# Patient Record
Sex: Female | Born: 1960 | Race: Black or African American | Hispanic: No | Marital: Single | Smoking: Never smoker
Health system: Southern US, Community
[De-identification: ages and names within clinical notes are randomized; demographics above are authoritative.]

## PROBLEM LIST (undated history)

## (undated) DIAGNOSIS — I639 Cerebral infarction, unspecified: Secondary | ICD-10-CM

## (undated) DIAGNOSIS — K469 Unspecified abdominal hernia without obstruction or gangrene: Secondary | ICD-10-CM

## (undated) DIAGNOSIS — R569 Unspecified convulsions: Secondary | ICD-10-CM

## (undated) DIAGNOSIS — I2699 Other pulmonary embolism without acute cor pulmonale: Secondary | ICD-10-CM

## (undated) DIAGNOSIS — F32A Depression, unspecified: Secondary | ICD-10-CM

## (undated) DIAGNOSIS — Z931 Gastrostomy status: Secondary | ICD-10-CM

## (undated) DIAGNOSIS — F8189 Other developmental disorders of scholastic skills: Secondary | ICD-10-CM

## (undated) DIAGNOSIS — R1310 Dysphagia, unspecified: Secondary | ICD-10-CM

## (undated) DIAGNOSIS — F419 Anxiety disorder, unspecified: Secondary | ICD-10-CM

## (undated) DIAGNOSIS — Z993 Dependence on wheelchair: Secondary | ICD-10-CM

## (undated) DIAGNOSIS — I69351 Hemiplegia and hemiparesis following cerebral infarction affecting right dominant side: Secondary | ICD-10-CM

## (undated) DIAGNOSIS — K219 Gastro-esophageal reflux disease without esophagitis: Secondary | ICD-10-CM

## (undated) HISTORY — PX: TUBAL LIGATION: SHX77

## (undated) HISTORY — PX: PEG PLACEMENT: SHX5437

## (undated) HISTORY — PX: IR TRANSCATH/EMBOLIZ: IMG695

## (undated) HISTORY — PX: IR CT HEAD LTD: IMG2386

## (undated) HISTORY — PX: IR PATIENT EVAL TECH 0-60 MINS: IMG5564

## (undated) HISTORY — PX: IR ANGIOGRAM FOLLOW UP STUDY: IMG697

## (undated) HISTORY — PX: IR US GUIDE VASC ACCESS RIGHT: IMG2390

## (undated) HISTORY — PX: CRANIOTOMY: SHX93

## (undated) HISTORY — PX: ESOPHAGOGASTRODUODENOSCOPY: SHX5428

## (undated) HISTORY — PX: IR ANGIO VERTEBRAL SEL VERTEBRAL UNI R MOD SED: IMG5368

## (undated) HISTORY — PX: IR ANGIO INTRA EXTRACRAN SEL INTERNAL CAROTID BILAT MOD SED: IMG5363

## (undated) HISTORY — PX: CRANIOPLASTY: SHX1407

## (undated) HISTORY — PX: IR RADIOLOGIST EVAL & MGMT: IMG5224

## (undated) HISTORY — PX: RADIOLOGY WITH ANESTHESIA: SHX6223

## (undated) HISTORY — PX: IR NEURO EACH ADD'L AFTER BASIC UNI RIGHT (MS): IMG5374

## (undated) HISTORY — PX: VENTRAL HERNIA REPAIR: SHX424

---

## 2000-09-18 ENCOUNTER — Encounter (HOSPITAL_COMMUNITY): Admission: RE | Admit: 2000-09-18 | Discharge: 2000-10-18 | Payer: Self-pay | Admitting: Preventative Medicine

## 2003-07-17 ENCOUNTER — Emergency Department (HOSPITAL_COMMUNITY): Admission: EM | Admit: 2003-07-17 | Discharge: 2003-07-17 | Payer: Self-pay | Admitting: Emergency Medicine

## 2003-08-01 ENCOUNTER — Emergency Department (HOSPITAL_COMMUNITY): Admission: EM | Admit: 2003-08-01 | Discharge: 2003-08-02 | Payer: Self-pay | Admitting: Emergency Medicine

## 2007-06-02 ENCOUNTER — Emergency Department (HOSPITAL_COMMUNITY): Admission: EM | Admit: 2007-06-02 | Discharge: 2007-06-02 | Payer: Self-pay | Admitting: Emergency Medicine

## 2007-06-06 ENCOUNTER — Ambulatory Visit (HOSPITAL_COMMUNITY): Admission: RE | Admit: 2007-06-06 | Discharge: 2007-06-06 | Payer: Self-pay | Admitting: Orthopaedic Surgery

## 2011-03-18 LAB — SYNOVIAL CELL COUNT + DIFF, W/ CRYSTALS
Crystals, Fluid: NONE SEEN
Eosinophils-Synovial: 0
Lymphocytes-Synovial Fld: 0
Monocyte-Macrophage-Synovial Fluid: 2 — ABNORMAL LOW
Neutrophil, Synovial: 98 — ABNORMAL HIGH
WBC, Synovial: 25280 — ABNORMAL HIGH

## 2011-03-18 LAB — BODY FLUID CULTURE: Culture: NO GROWTH

## 2013-03-28 ENCOUNTER — Encounter: Payer: Self-pay | Admitting: Family Medicine

## 2013-03-28 ENCOUNTER — Ambulatory Visit (INDEPENDENT_AMBULATORY_CARE_PROVIDER_SITE_OTHER): Payer: BC Managed Care – PPO | Admitting: Family Medicine

## 2013-03-28 ENCOUNTER — Ambulatory Visit (HOSPITAL_COMMUNITY)
Admission: RE | Admit: 2013-03-28 | Discharge: 2013-03-28 | Disposition: A | Discharge status: Home / Self Care | Payer: BC Managed Care – PPO | Source: Ambulatory Visit | Attending: Family Medicine | Admitting: Family Medicine

## 2013-03-28 VITALS — BP 148/80 | Ht 64.0 in | Wt 214.0 lb

## 2013-03-28 DIAGNOSIS — M25569 Pain in unspecified knee: Secondary | ICD-10-CM

## 2013-03-28 DIAGNOSIS — M25562 Pain in left knee: Secondary | ICD-10-CM

## 2013-03-28 MED ORDER — PREDNISONE 20 MG PO TABS: ORAL_TABLET | ORAL | Status: AC

## 2013-03-28 MED ORDER — PREDNISONE 20 MG PO TABS: ORAL_TABLET | ORAL | Status: DC

## 2013-03-28 NOTE — Progress Notes (Signed)
  Subjective:    Patient ID: Denise Savage, female    DOB: 12-Nov-1960, 52 y.o.   MRN: [id number]  Leg Pain  The incident occurred 12 to 24 hours ago. The pain is present in the left knee. The pain is severe. Associated symptoms include an inability to bear weight. The symptoms are aggravated by weight bearing. She has tried ice and NSAIDs for the symptoms.   PMH benign no particular history other than standing for long span of time yesterday. No known injury. No probably this before.   Review of Systems No sharp chest pain shortness of breath.    Objective:   Physical Exam Right leg totally normal left leg ankle normal calf nontender moderate knee pain and pain behind the left knee. Also some pain and discomfort in the lower thigh. There is some swelling in the knee.       Assessment & Plan:  Stat d-dimer, stat ultrasound-rule out DVT suspect Baker cyst warning signs were discussed.  Prednisone prescribed taper over the next 9 days no work over the next few days use crutches. If ongoing trouble followup. We will followup on the test results and call patient later today.  It should be noted that the ultrasound did not show a DVT. It did show complex area in the popliteal fossa may need MRI. D-dimer negative. Patient has a followup visit for next week. Was given a work note.

## 2013-03-29 ENCOUNTER — Telehealth: Payer: Self-pay | Admitting: *Deleted

## 2013-03-29 LAB — D-DIMER, QUANTITATIVE: D-Dimer, Quant: <0.27 ug/mL-FEU (ref 0.00–0.48)

## 2013-03-29 NOTE — Telephone Encounter (Signed)
Discussed with pt. Please give work excuse

## 2013-03-29 NOTE — Telephone Encounter (Signed)
More than likely hshe will not be able to return to work anytime soon, we can give note for F SAT and SUN but we can give note through follow up visit if she wishes

## 2013-03-29 NOTE — Telephone Encounter (Signed)
Discussed with pt ultrasound and d dimer negative for blood clot. Per Dr. Lorin Picket. appt made next week for a recheck of knee. Pt. Would like WE for 10/17, 10/18, 10/19 and needs to know when she should return to work.

## 2013-03-31 ENCOUNTER — Emergency Department (HOSPITAL_COMMUNITY)
Admission: EM | Admit: 2013-03-31 | Discharge: 2013-03-31 | Disposition: A | Discharge status: Home / Self Care | Payer: BC Managed Care – PPO | Attending: Emergency Medicine | Admitting: Emergency Medicine

## 2013-03-31 ENCOUNTER — Encounter (HOSPITAL_COMMUNITY): Payer: Self-pay | Admitting: Emergency Medicine

## 2013-03-31 ENCOUNTER — Emergency Department (HOSPITAL_COMMUNITY): Payer: BC Managed Care – PPO

## 2013-03-31 DIAGNOSIS — IMO0002 Reserved for concepts with insufficient information to code with codable children: Secondary | ICD-10-CM | POA: Insufficient documentation

## 2013-03-31 DIAGNOSIS — M25569 Pain in unspecified knee: Secondary | ICD-10-CM | POA: Insufficient documentation

## 2013-03-31 DIAGNOSIS — M25562 Pain in left knee: Secondary | ICD-10-CM

## 2013-03-31 MED ORDER — HYDROCODONE-ACETAMINOPHEN 5-325 MG PO TABS: ORAL_TABLET | ORAL | Status: DC

## 2013-03-31 NOTE — ED Notes (Signed)
Patient c/o left leg pain. Per patient started having stiffness in left knee, seen PCP on Thursday was sent here for [place] to check for DVT. Did not have DVT, given prednisone-no relief. Per patient woke with increase in swelling to left knee and pain radiating down left leg.

## 2013-04-02 NOTE — ED Provider Notes (Signed)
CSN: [id number]     Arrival date & time 03/31/13  1054 History   First MD Initiated Contact with Patient 03/31/13 1140     Chief Complaint  Patient presents with  . Knee Pain   (Consider location/radiation/quality/duration/timing/severity/associated sxs/prior Treatment) Patient is a 52 y.o. female presenting with knee pain. The history is provided by the patient.  Knee Pain Location:  Knee Time since incident:  1 week Injury: no   Knee location:  L knee Pain details:    Quality:  Aching and throbbing   Radiates to:  Does not radiate   Severity:  Moderate   Onset quality:  Gradual   Timing:  Constant   Progression:  Unchanged Chronicity:  New Dislocation: no   Prior injury to area:  No Relieved by:  Rest Worsened by:  Activity, bearing weight, extension and flexion Ineffective treatments: prednisone. Associated symptoms: stiffness   Associated symptoms: no back pain, no decreased ROM, no fever, no itching, no muscle weakness, no neck pain, no numbness, no swelling and no tingling     History reviewed. No pertinent past medical history. Past Surgical History  Procedure Laterality Date  . Tubal ligation     History reviewed. No pertinent family history. History  Substance Use Topics  . Smoking status: Never Smoker   . Smokeless tobacco: Never Used  . Alcohol Use: No   OB History   Grav Para Term Preterm Abortions TAB SAB Ect Mult Living   3 3 3       3      Review of Systems  Constitutional: Negative for fever and chills.  Genitourinary: Negative for dysuria and difficulty urinating.  Musculoskeletal: Positive for arthralgias and stiffness. Negative for back pain, joint swelling and neck pain.  Skin: Negative for color change, itching and wound.  All other systems reviewed and are negative.    Allergies  Review of patient's allergies indicates no known allergies.  Home Medications   Current Outpatient Rx  Name  Route  Sig  Dispense  Refill  .  HYDROcodone-acetaminophen (NORCO/VICODIN) 5-325 MG per tablet      Take one-two tabs po q 4-6 hrs prn pain   15 tablet   0   . predniSONE (DELTASONE) 20 MG tablet      3 qd for 3d , then 2qd for 3d then 1qd for 3d   18 tablet   0    BP 181/95  Pulse 59  Temp(Src) 98.1 F (36.7 C) (Oral)  Resp 18  Ht 5\' 4"  (1.626 m)  Wt 215 lb (97.523 kg)  BMI 36.89 kg/m2  SpO2 100% Physical Exam  Nursing note and vitals reviewed. Constitutional: She is oriented to person, place, and time. She appears well-developed and well-nourished. No distress.  Cardiovascular: Normal rate, regular rhythm, normal heart sounds and intact distal pulses.   No murmur heard. Pulmonary/Chest: Effort normal and breath sounds normal. No respiratory distress.  Musculoskeletal: She exhibits tenderness. She exhibits no edema.  ttp of the medial and posterior left knee.  No erythema, effusion, or step-off deformity.  DP pulse brisk, distal sensation intact. Calf is soft and NT.  Neurological: She is alert and oriented to person, place, and time. She exhibits normal muscle tone. Coordination normal.  Skin: Skin is warm and dry. No erythema.    ED Course  Procedures (including critical care time) Labs Review Labs Reviewed - No data to display Imaging Review  Dg Knee Complete 4 Views Left  03/31/2013   CLINICAL  DATA:  Left knee pain, stiffness and swelling. Denies trauma.  EXAM: LEFT KNEE - COMPLETE 4+ VIEW  COMPARISON:  None.  FINDINGS: No fracture or dislocation.  There are arthropathic changes with significant involvement of the patellofemoral joint space compartment. There is irregularity and cystic change along the dorsal patella as well as superior and inferior spurs and narrowing of the joint space. Milder arthropathic changes are noted of the femorotibial joint space compartments. There are marginal spurs from these compartments and from the tibial spine. A small joint effusion is present.  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   Electronically Signed   By: Amie Portland M.D.   On: 03/31/2013 12:38      Previous US of the left LE from 03/28/13 was reviewed.    MDM   1. Knee pain, left    Patient with left medial and posterior knee pain.  Seen by her PMD recently for same.  Comes to ED for continued pain.  X-ray results discussed with pt.  She agrees to continue prednisone , vicodin written for pain and referral given for ortho.  Pt agrees to care plan and appears stable for discharge.    Agata Lucente L. Aashna Matson, PA-C 04/02/13 2025

## 2013-04-04 ENCOUNTER — Encounter: Payer: Self-pay | Admitting: Family Medicine

## 2013-04-04 ENCOUNTER — Ambulatory Visit (INDEPENDENT_AMBULATORY_CARE_PROVIDER_SITE_OTHER): Payer: BC Managed Care – PPO | Admitting: Family Medicine

## 2013-04-04 VITALS — BP 152/98 | Ht 64.0 in | Wt 218.8 lb

## 2013-04-04 DIAGNOSIS — M25562 Pain in left knee: Secondary | ICD-10-CM

## 2013-04-04 DIAGNOSIS — M25569 Pain in unspecified knee: Secondary | ICD-10-CM

## 2013-04-04 NOTE — Progress Notes (Signed)
  Subjective:    Patient ID: Denise Savage, female    DOB: 30-Dec-1960, 52 y.o.   MRN: [id number]  HPI Patient is here today for a f/u on her left, lower leg pain.  She can walk and bear weight now.  However, she does have a bruise on her leg.  Pt went to the ER on Sunday b/c she had stiffness in her leg. They took x-rays (only found arthritis) and gave her Vicodin, but she cannot find it, so she hasn't been taking it. Patient states that she was doing fairly well before the first day this started bothering her she worked all day long plus also stayed up at home working and she started feeling pain discomfort swelling in the knee it got worse next few days we saw her initially did blood work and ultrasound rule out a DVT. She was seen in the ER at x-rays which showed arthritis but the ultrasound showed a complex cystic mass behind the knee. The radiologist states that he could not tell if this was truly a Baker's cyst. They recommended MRI to further delineate what is going on. PMH benign family history noncontributory social noncontributory Review of Systems    see above Objective:   Physical Exam  Lungs are clear hearts regular swelling in the left leg noted more in the upper calf behind the knee tenderness pain with movement      Assessment & Plan:  Left knee pain- to get MRI, complex cystic mass needs MRI to help delineate what is going on a weight the results of the MRI.

## 2013-04-09 NOTE — ED Provider Notes (Signed)
Medical screening examination/treatment/procedure(s) were performed by non-physician practitioner and as supervising physician I was immediately available for consultation/collaboration.  EKG Interpretation   None        Donnetta Hutching, MD 04/09/13 1730

## 2013-04-11 ENCOUNTER — Other Ambulatory Visit (HOSPITAL_COMMUNITY): Payer: BC Managed Care – PPO

## 2013-04-17 ENCOUNTER — Telehealth: Payer: Self-pay | Admitting: Family Medicine

## 2013-04-17 NOTE — Telephone Encounter (Signed)
Please review results from [facility] for patients MR Left Knee without Contrast.

## 2013-04-22 NOTE — Telephone Encounter (Signed)
Spoke with patient regarding:  This MRI does not show any evidence of any tumor. 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 No sign of any growth. No need for any further testing. The knee should gradually get back to normal if it does not she should let us know.

## 2013-04-22 NOTE — Telephone Encounter (Signed)
This MRI does not show any evidence of any tumor. 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 No sign of any growth. No need for any further testing. The knee should gradually get back to normal if it does not she should let us know.

## 2013-05-10 ENCOUNTER — Encounter: Payer: Self-pay | Admitting: Family Medicine

## 2013-08-01 ENCOUNTER — Encounter: Payer: Self-pay | Admitting: Family Medicine

## 2013-08-11 ENCOUNTER — Emergency Department (HOSPITAL_COMMUNITY): Payer: BC Managed Care – PPO

## 2013-08-11 ENCOUNTER — Encounter (HOSPITAL_COMMUNITY): Payer: Self-pay | Admitting: Emergency Medicine

## 2013-08-11 ENCOUNTER — Emergency Department (HOSPITAL_COMMUNITY)
Admission: EM | Admit: 2013-08-11 | Discharge: 2013-08-12 | Disposition: A | Discharge status: Home / Self Care | Payer: BC Managed Care – PPO | Attending: Emergency Medicine | Admitting: Emergency Medicine

## 2013-08-11 DIAGNOSIS — R079 Chest pain, unspecified: Secondary | ICD-10-CM | POA: Insufficient documentation

## 2013-08-11 DIAGNOSIS — R233 Spontaneous ecchymoses: Secondary | ICD-10-CM | POA: Insufficient documentation

## 2013-08-11 DIAGNOSIS — R600 Localized edema: Secondary | ICD-10-CM

## 2013-08-11 DIAGNOSIS — R609 Edema, unspecified: Secondary | ICD-10-CM | POA: Insufficient documentation

## 2013-08-11 LAB — BASIC METABOLIC PANEL
BUN: 12 mg/dL (ref 6–23)
CO2: 30 mEq/L (ref 19–32)
Calcium: 10.7 mg/dL — ABNORMAL HIGH (ref 8.4–10.5)
Chloride: 100 mEq/L (ref 96–112)
Creatinine, Ser: 0.68 mg/dL (ref 0.50–1.10)
GFR calc Af Amer: >90 mL/min (ref >90)
GFR calc non Af Amer: >90 mL/min (ref >90)
Glucose, Bld: 96 mg/dL (ref 70–99)
Potassium: 3.8 mEq/L (ref 3.7–5.3)
Sodium: 137 mEq/L (ref 137–147)

## 2013-08-11 LAB — CBC WITH DIFFERENTIAL/PLATELET
Basophils Absolute: 0 10*3/uL (ref 0.0–0.1)
Basophils Relative: 1 % (ref 0–1)
Eosinophils Absolute: 0.2 10*3/uL (ref 0.0–0.7)
Eosinophils Relative: 3 % (ref 0–5)
HCT: 36.6 % (ref 36.0–46.0)
Hemoglobin: 11.8 g/dL — ABNORMAL LOW (ref 12.0–15.0)
Lymphocytes Relative: 26 % (ref 12–46)
Lymphs Abs: 1.5 10*3/uL (ref 0.7–4.0)
MCH: 26.8 pg (ref 26.0–34.0)
MCHC: 32.2 g/dL (ref 30.0–36.0)
MCV: 83.2 fL (ref 78.0–100.0)
Monocytes Absolute: 0.5 10*3/uL (ref 0.1–1.0)
Monocytes Relative: 8 % (ref 3–12)
Neutro Abs: 3.5 10*3/uL (ref 1.7–7.7)
Neutrophils Relative %: 63 % (ref 43–77)
Platelets: 270 10*3/uL (ref 150–400)
RBC: 4.4 MIL/uL (ref 3.87–5.11)
RDW: 15.9 % — ABNORMAL HIGH (ref 11.5–15.5)
WBC: 5.6 10*3/uL (ref 4.0–10.5)

## 2013-08-11 LAB — D-DIMER, QUANTITATIVE (NOT AT ARMC): D-Dimer, Quant: 1.7 ug/mL-FEU — ABNORMAL HIGH (ref 0.00–0.48)

## 2013-08-11 LAB — TROPONIN I: Troponin I: <0.3 ng/mL (ref <0.30)

## 2013-08-11 MED ORDER — HYDROCODONE-ACETAMINOPHEN 5-325 MG PO TABS: 2.0000 | ORAL_TABLET | ORAL | Status: DC | PRN

## 2013-08-11 MED ORDER — IOHEXOL 350 MG/ML SOLN
100.0000 mL | Freq: Once | INTRAVENOUS | Status: AC | PRN
  Administered 2013-08-11: 100 mL via INTRAVENOUS

## 2013-08-11 MED ORDER — SODIUM CHLORIDE 0.9 % IJ SOLN
INTRAMUSCULAR | Status: AC
  Filled 2013-08-11: qty 250

## 2013-08-11 MED ORDER — ENOXAPARIN SODIUM 100 MG/ML ~~LOC~~ SOLN
1.0000 mg/kg | Freq: Once | SUBCUTANEOUS | Status: AC
  Administered 2013-08-12: 100 mg via SUBCUTANEOUS
  Filled 2013-08-11: qty 1

## 2013-08-11 NOTE — ED Notes (Signed)
Family at bedside. Patient watching TV at this time. States that she is not having any pain at this time.

## 2013-08-11 NOTE — ED Notes (Signed)
Patient informed that an IV needs to be placed for a CT scan.  Patient verbalized understanding.

## 2013-08-11 NOTE — ED Notes (Signed)
Patient denies pain at this time.  Patient states she is hungry; Dr. Adriana Simas aware and states it is ok for patient to eat.  Patient advocate getting food for patient.

## 2013-08-11 NOTE — Discharge Instructions (Signed)
Prescription for pain medicine. Nurse will instruct you time to return tomorrow for Doppler test of left leg

## 2013-08-11 NOTE — ED Notes (Signed)
Patient given crackers, peanut butter and drink.

## 2013-08-11 NOTE — ED Notes (Signed)
Pt states her left leg has been swollen for a while. States she has had an [place] and MRI . States she was told the swelling was from arthritis. Also, complain of chest pain that started two days ago. Denies other symytoms

## 2013-08-11 NOTE — ED Provider Notes (Signed)
CSN: [id number]     Arrival date & time 08/11/13  1723 History  This chart was scribed for Denise Hutching, MD by Denise Savage, ED Scribe. This patient was seen in room APA19/APA19 and the patient's care was started at 5:42 PM.   Chief Complaint  Patient presents with  . Chest Pain     The history is provided by the patient. No language interpreter was used.    HPI Comments: RIKKI Savage is a 53 y.o. female who presents to the Emergency Department complaining of central CP that started around 1:30 PM yesterday. The pain has been constant since the onset and she describes the pain as a nagging, aching sensation. She denies any SOB, diaphoresis, nausea or emesis. She has taken Excredrin today with no improvement She denies any chronic medical conditions. She denies any family h/o cardiac conditions. She denies smoking. 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 She is currently on night shifts but states that she gets enough sleep and enjoys night shifts.   She has a secondary complaint of left leg swelling with bruising for the past week. Pt states that she had similar symptoms one month. She was seen by Dr. Gerda Diss and had x-rays and an MRI performed that were normal. She was also seen in the ED for the same with a normal [place].    History reviewed. No pertinent past medical history. Past Surgical History  Procedure Laterality Date  . Tubal ligation     History reviewed. No pertinent family history. History  Substance Use Topics  . Smoking status: Never Smoker   . Smokeless tobacco: Never Used  . Alcohol Use: No   OB History   Grav Para Term Preterm Abortions TAB SAB Ect Mult Living   3 3 3       3      Review of Systems  A complete 10 system review of systems was obtained and all systems are negative except as noted in the HPI and PMH.    Allergies  Review of patient's allergies indicates no known allergies.  Home Medications   Current  Outpatient Rx  Name  Route  Sig  Dispense  Refill  . ibuprofen (ADVIL,MOTRIN) 200 MG tablet   Oral   Take 200 mg by mouth every 6 (six) hours as needed. pain         . HYDROcodone-acetaminophen (NORCO) 5-325 MG per tablet   Oral   Take 2 tablets by mouth every 4 (four) hours as needed.   20 tablet   0    BP 167/88  Pulse 59  Temp(Src) 98.2 F (36.8 C) (Oral)  Resp 21  SpO2 99% Physical Exam  Nursing note and vitals reviewed. Constitutional: She is oriented to person, place, and time. She appears well-developed and well-nourished.  HENT:  Head: Normocephalic and atraumatic.  Eyes: Conjunctivae and EOM are normal. Pupils are equal, round, and reactive to light.  Neck: Normal range of motion. Neck supple.  Cardiovascular: Normal rate, regular rhythm and normal heart sounds.   Pulmonary/Chest: Effort normal and breath sounds normal.  Abdominal: Soft. Bowel sounds are normal.  Musculoskeletal: Normal range of motion. She exhibits edema.  Left leg is edematous with ecchymosis on the left calf. Right leg is normal  Neurological: She is alert and oriented to person, place, and time.  Skin: Skin is warm and dry.  Psychiatric: She has a normal mood and affect. Her behavior is  normal.    ED Course  Procedures (including critical care time)  DIAGNOSTIC STUDIES: Oxygen Saturation is 99% on room air;  normal by my interpretation.    COORDINATION OF CARE: 5:49 PM-Informed pt that her risk factors are low. Discussed treatment plan which includes  with pt at bedside and pt agreed to plan.   Labs Review Labs Reviewed  BASIC METABOLIC PANEL - Abnormal; Notable for the following:    Calcium 10.7 (*)    All other components within normal limits  CBC WITH DIFFERENTIAL - Abnormal; Notable for the following:    Hemoglobin 11.8 (*)    RDW 15.9 (*)    All other components within normal limits  D-DIMER, QUANTITATIVE - Abnormal; Notable for the following:    D-Dimer, Quant 1.70 (*)     All other components within normal limits  TROPONIN I   Imaging Review Dg Chest 2 View  08/11/2013   CLINICAL DATA:  Chest pain  EXAM: CHEST  2 VIEW  COMPARISON:  None.  FINDINGS: Cardiomediastinal silhouette is unremarkable. No acute infiltrate or pleural effusion. No pulmonary edema. Bony thorax is unremarkable.  IMPRESSION: No active cardiopulmonary disease.   Electronically Signed   By: Denise Savage  Pop M.D.   On: 08/11/2013 19:00   Ct Angio Chest Pe W/cm &/or Wo Cm  08/11/2013   CLINICAL DATA:  Chest pain. Edema. Leg swelling. Pulmonary embolism.  EXAM: CT ANGIOGRAPHY CHEST WITH CONTRAST  TECHNIQUE: Multidetector CT imaging of the chest was performed using the standard protocol during bolus administration of intravenous contrast. Multiplanar CT image reconstructions and MIPs were obtained to evaluate the vascular anatomy.  CONTRAST:  100mL OMNIPAQUE IOHEXOL 350 MG/ML SOLN  COMPARISON:  DG CHEST 2 VIEW dated 08/11/2013  FINDINGS: Technically adequate study without pulmonary embolus. No acute aortic abnormality. No axillary adenopathy. No pericardial or pleural effusion. Incidental imaging of the upper abdomen is within normal limits aside from a small hiatal hernia. Aortic branch vessels appear within normal limits.  There is a soft tissue nodule in the anterior mediastinal fat measuring 13 mm x 22 mm. No aggressive osseous lesions. Mild thoracic spondylosis. Step-off reconstruction artifact is noted on the sagittal images.  Review of the MIP images confirms the above findings.  IMPRESSION: 1. Negative for pulmonary embolus or acute aortic abnormality. 2. Small mediastinal soft tissue nodule. The appearance is nonspecific. The differential considerations are lymphadenopathy associated with lymphoma, thymoma, germ cell tumor.   Electronically Signed   By: Denise NewportGeoffrey  Savage M.D.   On: 08/11/2013 20:48     EKG Interpretation   Date/Time:  Sunday August 11 2013 17:32:56 EST Ventricular Rate:  67 PR Interval:   126 QRS Duration: 90 QT Interval:  404 QTC Calculation: 426 R Axis:   4 Text Interpretation:  Normal sinus rhythm with sinus arrhythmia Normal ECG  No previous ECGs available Confirmed by Cherity Blickenstaff  MD, Tyrell Brereton ([id number]) on  08/11/2013 6:12:22 PM      MDM   Final diagnoses:  Chest pain  Edema of left lower extremity   CT scan of chest reveals no pulmonary embolism. Lovenox 1 mg per kilogram subcutaneous given. Patient will return tomorrow for a Doppler study of left lower extremity. Discharge medications Norco.  Patient in no acute distress at discharge  I personally performed the services described in this documentation, which was scribed in my presence. The recorded information has been reviewed and is accurate.      Denise HutchingBrian Chevon Fomby, MD 08/11/13 [phone]

## 2013-08-11 NOTE — ED Notes (Signed)
Pt c/o chest pain and with left leg pain and swelling, cp since yesterday, denies sob, cough or N/V

## 2013-08-12 ENCOUNTER — Ambulatory Visit (HOSPITAL_COMMUNITY)
Admit: 2013-08-12 | Discharge: 2013-08-12 | Disposition: A | Discharge status: Home / Self Care | Payer: BC Managed Care – PPO | Attending: Emergency Medicine | Admitting: Emergency Medicine

## 2013-08-12 DIAGNOSIS — M79609 Pain in unspecified limb: Secondary | ICD-10-CM | POA: Insufficient documentation

## 2013-08-12 DIAGNOSIS — R609 Edema, unspecified: Secondary | ICD-10-CM | POA: Insufficient documentation

## 2013-08-12 NOTE — ED Provider Notes (Signed)
No DVT on [place]. [place] c/w Baker's cyst rupture. Pt informed. 1655  Hurman Horn, MD 08/12/13 [phone]

## 2013-11-28 ENCOUNTER — Encounter: Payer: Self-pay | Admitting: Family Medicine

## 2013-11-28 ENCOUNTER — Ambulatory Visit (INDEPENDENT_AMBULATORY_CARE_PROVIDER_SITE_OTHER): Payer: BC Managed Care – PPO | Admitting: Family Medicine

## 2013-11-28 VITALS — BP 140/88 | Ht 64.0 in | Wt 221.0 lb

## 2013-11-28 DIAGNOSIS — M7661 Achilles tendinitis, right leg: Secondary | ICD-10-CM

## 2013-11-28 DIAGNOSIS — M722 Plantar fascial fibromatosis: Secondary | ICD-10-CM

## 2013-11-28 DIAGNOSIS — M7660 Achilles tendinitis, unspecified leg: Secondary | ICD-10-CM

## 2013-11-28 MED ORDER — DICLOFENAC SODIUM 75 MG PO TBEC: 75.0000 mg | DELAYED_RELEASE_TABLET | Freq: Two times a day (BID) | ORAL | Status: DC

## 2013-11-28 NOTE — Patient Instructions (Addendum)
Cold compresses 20 minutes 2 to 3 times a day  Stretching exercises several times a day  If not better in 2 to 3 weeks call and we will set up podiatrist visit,   Plantar Fasciitis Plantar fasciitis is a common condition that causes foot pain. It is soreness (inflammation) of the band of tough fibrous tissue on the bottom of the foot that runs from the heel bone (calcaneus) to the ball of the foot. The cause of this soreness may be from excessive standing, poor fitting shoes, running on hard surfaces, being overweight, having an abnormal walk, or overuse (this is common in runners) of the painful foot or feet. It is also common in aerobic exercise dancers and ballet dancers. SYMPTOMS  Most people with plantar fasciitis complain of:  Severe pain in the morning on the bottom of their foot especially when taking the first steps out of bed. This pain recedes after a few minutes of walking.  Severe pain is experienced also during walking following a long period of inactivity.  Pain is worse when walking barefoot or up stairs DIAGNOSIS   Your caregiver will diagnose this condition by examining and feeling your foot.  Special tests such as X-rays of your foot, are usually not needed. PREVENTION   Consult a sports medicine professional before beginning a new exercise program.  Walking programs offer a good workout. With walking there is a lower chance of overuse injuries common to runners. There is less impact and less jarring of the joints.  Begin all new exercise programs slowly. If problems or pain develop, decrease the amount of time or distance until you are at a comfortable level.  Wear good shoes and replace them regularly.  Stretch your foot and the heel cords at the back of the ankle (Achilles tendon) both before and after exercise.  Run or exercise on even surfaces that are not hard. For example, asphalt is better than pavement.  Do not run barefoot on hard surfaces.  If using  a treadmill, vary the incline.  Do not continue to workout if you have foot or joint problems. Seek professional help if they do not improve. HOME CARE INSTRUCTIONS   Avoid activities that cause you pain until you recover.  Use ice or cold packs on the problem or painful areas after working out.  Only take over-the-counter or prescription medicines for pain, discomfort, or fever as directed by your caregiver.  Soft shoe inserts or athletic shoes with air or gel sole cushions may be helpful.  If problems continue or become more severe, consult a sports medicine caregiver or your own health care Eulah Walkup. Cortisone is a potent anti-inflammatory medication that may be injected into the painful area. You can discuss this treatment with your caregiver. MAKE SURE YOU:   Understand these instructions.  Will watch your condition.  Will get help right away if you are not doing well or get worse. Document Released: 02/22/2001 Document Revised: 08/22/2011 Document Reviewed: 04/23/2008 [facility] Patient Information 2015 [place], [place]. This information is not intended to replace advice given to you by your health care Milia Warth. Make sure you discuss any questions you have with your health care Elijio Staples.

## 2013-11-28 NOTE — Progress Notes (Signed)
   Subjective:    Patient ID: Denise Savage, female    DOB: Oct 13, 1960, 53 y.o.   MRN: [id number]  HPI Patient arrives with complaint of right heel pain for a while- doing a lot of walking and worse today. Present for 2 months No known injury but she stands on her feet all day long. She states the pain and discomfort is worse the longer she stands it is sore when she first puts weight on it. She denies any specific injury. No numbness. Review of Systems    see above Objective:   Physical Exam Right calf is normal not swollen the knee is normal. The heel has mild tenderness. The Achilles areas also has tenderness       Assessment & Plan:  Podiatry if ongoing I showed her stretching exercises to do plus also cold compresses several times a day If not significantly better in the next 2-3 weeks notify us and we will set her up with podiatry Anti-inflammatory prescribed

## 2014-04-14 ENCOUNTER — Encounter: Payer: Self-pay | Admitting: Family Medicine

## 2014-11-15 ENCOUNTER — Encounter (HOSPITAL_COMMUNITY): Payer: Self-pay | Admitting: Emergency Medicine

## 2014-11-15 ENCOUNTER — Emergency Department (HOSPITAL_COMMUNITY)
Admission: EM | Admit: 2014-11-15 | Discharge: 2014-11-15 | Disposition: A | Discharge status: Home / Self Care | Payer: BLUE CROSS/BLUE SHIELD | Attending: Emergency Medicine | Admitting: Emergency Medicine

## 2014-11-15 DIAGNOSIS — R109 Unspecified abdominal pain: Secondary | ICD-10-CM | POA: Diagnosis present

## 2014-11-15 DIAGNOSIS — K439 Ventral hernia without obstruction or gangrene: Secondary | ICD-10-CM

## 2014-11-15 DIAGNOSIS — Z9851 Tubal ligation status: Secondary | ICD-10-CM | POA: Insufficient documentation

## 2014-11-15 DIAGNOSIS — Z791 Long term (current) use of non-steroidal anti-inflammatories (NSAID): Secondary | ICD-10-CM | POA: Insufficient documentation

## 2014-11-15 LAB — COMPREHENSIVE METABOLIC PANEL WITH GFR
ALT: 10 U/L — ABNORMAL LOW (ref 14–54)
AST: 21 U/L (ref 15–41)
Albumin: 3.3 g/dL — ABNORMAL LOW (ref 3.5–5.0)
Alkaline Phosphatase: 76 U/L (ref 38–126)
Anion gap: 8 (ref 5–15)
BUN: 12 mg/dL (ref 6–20)
CO2: 26 mmol/L (ref 22–32)
Calcium: 9.9 mg/dL (ref 8.9–10.3)
Chloride: 106 mmol/L (ref 101–111)
Creatinine, Ser: 0.89 mg/dL (ref 0.44–1.00)
GFR calc Af Amer: >60 mL/min
GFR calc non Af Amer: >60 mL/min
Glucose, Bld: 107 mg/dL — ABNORMAL HIGH (ref 65–99)
Potassium: 3.2 mmol/L — ABNORMAL LOW (ref 3.5–5.1)
Sodium: 140 mmol/L (ref 135–145)
Total Bilirubin: 0.5 mg/dL (ref 0.3–1.2)
Total Protein: 6.5 g/dL (ref 6.5–8.1)

## 2014-11-15 LAB — LIPASE, BLOOD: Lipase: 36 U/L (ref 22–51)

## 2014-11-15 LAB — CBC WITH DIFFERENTIAL/PLATELET
Basophils Absolute: 0 10*3/uL (ref 0.0–0.1)
Basophils Relative: 1 % (ref 0–1)
Eosinophils Absolute: 0.1 10*3/uL (ref 0.0–0.7)
Eosinophils Relative: 3 % (ref 0–5)
HCT: 40.2 % (ref 36.0–46.0)
Hemoglobin: 12.7 g/dL (ref 12.0–15.0)
Lymphocytes Relative: 32 % (ref 12–46)
Lymphs Abs: 1.6 10*3/uL (ref 0.7–4.0)
MCH: 27.8 pg (ref 26.0–34.0)
MCHC: 31.6 g/dL (ref 30.0–36.0)
MCV: 88 fL (ref 78.0–100.0)
Monocytes Absolute: 0.5 10*3/uL (ref 0.1–1.0)
Monocytes Relative: 10 % (ref 3–12)
Neutro Abs: 2.8 10*3/uL (ref 1.7–7.7)
Neutrophils Relative %: 54 % (ref 43–77)
Platelets: 233 10*3/uL (ref 150–400)
RBC: 4.57 MIL/uL (ref 3.87–5.11)
RDW: 13.9 % (ref 11.5–15.5)
WBC: 5.1 10*3/uL (ref 4.0–10.5)

## 2014-11-15 MED ORDER — IBUPROFEN 800 MG PO TABS
800.0000 mg | ORAL_TABLET | Freq: Once | ORAL | Status: AC
  Administered 2014-11-15: 800 mg via ORAL
  Filled 2014-11-15: qty 1

## 2014-11-15 MED ORDER — ONDANSETRON 4 MG PO TBDP: 4.0000 mg | ORAL_TABLET | Freq: Three times a day (TID) | ORAL | Status: DC | PRN

## 2014-11-15 MED ORDER — ONDANSETRON 4 MG PO TBDP
4.0000 mg | ORAL_TABLET | Freq: Once | ORAL | Status: AC
  Administered 2014-11-15: 4 mg via ORAL
  Filled 2014-11-15: qty 1

## 2014-11-15 MED ORDER — HYDROCODONE-ACETAMINOPHEN 5-325 MG PO TABS: 1.0000 | ORAL_TABLET | ORAL | Status: DC | PRN

## 2014-11-15 NOTE — ED Notes (Signed)
Patient complaining of upper abdominal pain. Reports history of hernia that hasn't caused her any problems in the past. States now she has a new knot that is hard and states abdomen is swollen more so than normal. Patient guarding upper abdomen during triage.

## 2014-11-15 NOTE — Discharge Instructions (Signed)

## 2014-11-15 NOTE — ED Provider Notes (Signed)
TIME SEEN: 6:20 AM  CHIEF COMPLAINT: Abdominal pain, nausea  HPI: Pt is a 54 y.o. female with history of tubal ligation, abdominal hernia who presents to the emergency department with upper abdominal pain, swelling in this area. States that her hernia and does not normally bother her but today began to be more swollen and painful. She has had nausea but no vomiting. No diarrhea. No fever. No chest pain or shortness of breath.  ROS: See HPI Constitutional: no fever  Eyes: no drainage  ENT: no runny nose   Cardiovascular:  no chest pain  Resp: no SOB  GI: no vomiting GU: no dysuria Integumentary: no rash  Allergy: no hives  Musculoskeletal: no leg swelling  Neurological: no slurred speech ROS otherwise negative  PAST MEDICAL HISTORY/PAST SURGICAL HISTORY:  History reviewed. No pertinent past medical history.  MEDICATIONS:  Prior to Admission medications   Medication Sig Start Date End Date Taking? Authorizing Provider  diclofenac (VOLTAREN) 75 MG EC tablet Take 1 tablet (75 mg total) by mouth 2 (two) times daily. 11/28/13   Babs Sciara, MD  naproxen sodium (ANAPROX) 220 MG tablet Take 220 mg by mouth 2 (two) times daily with a meal.    Historical Provider, MD    ALLERGIES:  No Known Allergies  SOCIAL HISTORY:  History  Substance Use Topics  . Smoking status: Never Smoker   . Smokeless tobacco: Never Used  . Alcohol Use: No    FAMILY HISTORY: History reviewed. No pertinent family history.  EXAM: BP 168/90 mmHg  Pulse 60  Temp(Src) 97.5 F (36.4 C) (Oral)  Resp 22  Ht 5\' 3"  (1.6 m)  Wt 215 lb (97.523 kg)  BMI 38.09 kg/m2  SpO2 99% CONSTITUTIONAL: Alert and oriented and responds appropriately to questions. Well-appearing; well-nourished, appears uncomfortable but nontoxic HEAD: Normocephalic EYES: Conjunctivae clear, PERRL ENT: normal nose; no rhinorrhea; moist mucous membranes; pharynx without lesions noted NECK: Supple, no meningismus, no LAD  CARD: RRR; S1  and S2 appreciated; no murmurs, no clicks, no rubs, no gallops RESP: Normal chest excursion without splinting or tachypnea; breath sounds clear and equal bilaterally; no wheezes, no rhonchi, no rales, no hypoxia or respiratory distress, speaking full sentences ABD/GI: Normal bowel sounds; soft, patient has a epigastric ventral hernia that is tender to palpation, I am able to reduce this hernia using Trendelenburg which gives patient significant relief of pain, no guarding or rebound, no peritoneal signs BACK:  The back appears normal and is non-tender to palpation, there is no CVA tenderness EXT: Normal ROM in all joints; non-tender to palpation; no edema; normal capillary refill; no cyanosis, no calf tenderness or swelling    SKIN: Normal color for age and race; warm NEURO: Moves all extremities equally, sensation to light touch intact diffusely, cranial nerves II through XII intact PSYCH: The patient's mood and manner are appropriate. Grooming and personal hygiene are appropriate.  MEDICAL DECISION MAKING: Patient here with large epigastric abdominal hernia that was reducible with Trendelenburg. Otherwise seemed dynamically stable. Her pain has significant improved once hernia was reduced. Labs pending. Will give ibuprofen, Zofran and monitor. Anticipate discharge home with close outpatient follow-up with general surgery.  ED PROGRESS: Patient's labs are unremarkable. We'll discharge home with Vicodin, Zofran and surgery follow-up information. Discussed return precautions and how to attempt to reduce her hernia at home if symptoms return. She verbalized understanding and is comfortable with this plan.     Layla Maw Ward, DO 11/15/14 [phone]

## 2014-11-28 ENCOUNTER — Emergency Department (HOSPITAL_COMMUNITY)
Admission: EM | Admit: 2014-11-28 | Discharge: 2014-11-28 | Disposition: A | Discharge status: Home / Self Care | Payer: BLUE CROSS/BLUE SHIELD | Attending: Emergency Medicine | Admitting: Emergency Medicine

## 2014-11-28 ENCOUNTER — Encounter (HOSPITAL_COMMUNITY): Payer: Self-pay | Admitting: Emergency Medicine

## 2014-11-28 DIAGNOSIS — K439 Ventral hernia without obstruction or gangrene: Secondary | ICD-10-CM | POA: Diagnosis not present

## 2014-11-28 DIAGNOSIS — Z9851 Tubal ligation status: Secondary | ICD-10-CM | POA: Diagnosis not present

## 2014-11-28 DIAGNOSIS — Z791 Long term (current) use of non-steroidal anti-inflammatories (NSAID): Secondary | ICD-10-CM | POA: Insufficient documentation

## 2014-11-28 DIAGNOSIS — R1084 Generalized abdominal pain: Secondary | ICD-10-CM | POA: Diagnosis present

## 2014-11-28 HISTORY — DX: Unspecified abdominal hernia without obstruction or gangrene: K46.9

## 2014-11-28 MED ORDER — ABDOMINAL BINDER/ELASTIC LARGE MISC: 1.0000 [IU] | Freq: Every day | Status: DC

## 2014-11-28 NOTE — Discharge Instructions (Signed)

## 2014-11-28 NOTE — ED Notes (Signed)
Patient c/o abd pain. Patient seen here in ER recently due to abd pain from hernia. Patient to see Dr Lovell Sheehan on 12/09/2014. Per patient abd pain started this morning and has progressively gotten worse. Denies taking anything for pain. Patient tearful in triage from pain. Denies any nausea or vomiting.

## 2014-11-28 NOTE — ED Provider Notes (Signed)
CSN: [id number]     Arrival date & time 11/28/14  1612 History   First MD Initiated Contact with Patient 11/28/14 1752     Chief Complaint  Patient presents with  . Abdominal Pain     (Consider location/radiation/quality/duration/timing/severity/associated sxs/prior Treatment) Patient is a 54 y.o. female presenting with abdominal pain. The history is provided by the patient.  Abdominal Pain Pain location:  Generalized Associated symptoms: no chest pain, no diarrhea, no hematuria, no nausea, no shortness of breath and no vomiting    patient has had somewhat diffuse abdominal pain for most a day. Began at the site of her ventral hernia. She's had this hernia in the past is due to see Dr. Lovell Sheehan in 11 days. States it has been more prominent recently. States unfortunately it became more painful. No nausea vomiting diarrhea or constipation. No fevers. Pain is now more diffuse. Worse with movement. She has not tried to reduce the hernia on her own. No new trauma.  Past Medical History  Diagnosis Date  . Abdominal hernia    Past Surgical History  Procedure Laterality Date  . Tubal ligation     History reviewed. No pertinent family history. History  Substance Use Topics  . Smoking status: Never Smoker   . Smokeless tobacco: Never Used  . Alcohol Use: No   OB History    Gravida Para Term Preterm AB TAB SAB Ectopic Multiple Living   Review of Systems  Constitutional: Negative for activity change and appetite change.  Eyes: Negative for pain.  Respiratory: Negative for chest tightness and shortness of breath.   Cardiovascular: Negative for chest pain and leg swelling.  Gastrointestinal: Positive for abdominal pain. Negative for nausea, vomiting and diarrhea.  Genitourinary: Negative for hematuria and flank pain.  Musculoskeletal: Negative for back pain and neck stiffness.  Neurological: Negative for headaches.      Allergies  Review of patient's allergies  indicates no known allergies.  Home Medications   Prior to Admission medications   Medication Sig Start Date End Date Taking? Authorizing Provider  diclofenac (VOLTAREN) 75 MG EC tablet Take 1 tablet (75 mg total) by mouth 2 (two) times daily. 11/28/13   Babs Sciara, MD  HYDROcodone-acetaminophen (NORCO/VICODIN) 5-325 MG per tablet Take 1 tablet by mouth every 4 (four) hours as needed. 11/15/14   Kristen N Ward, DO  naproxen sodium (ANAPROX) 220 MG tablet Take 220 mg by mouth 2 (two) times daily with a meal.    Historical Provider, MD  ondansetron (ZOFRAN ODT) 4 MG disintegrating tablet Take 1 tablet (4 mg total) by mouth every 8 (eight) hours as needed for nausea or vomiting. 11/15/14   Kristen N Ward, DO   BP 174/84 mmHg  Pulse 63  Temp(Src) 98.2 F (36.8 C) (Oral)  Resp 20  Ht  (1.626 m)  Wt 210 lb (95.255 kg)  BMI 36.03 kg/m2  SpO2 100% Physical Exam  Constitutional: She appears well-developed and well-nourished.  HENT:  Head: Atraumatic.  Cardiovascular: Normal rate and regular rhythm.   Pulmonary/Chest: Effort normal.  Abdominal: Soft. She exhibits mass. There is tenderness.  Tenderness over ventral hernia on the midline superior to the umbilicus. With gentle pressure the hernia reduces but does recur. No other hernias palpated. No other abdominal distention.    ED Course  Procedures (including critical care time) Labs Review Labs Reviewed - No data to display  Imaging Review  No results found.   EKG Interpretation None      MDM   Final diagnoses:  Ventral hernia without obstruction or gangrene    Patient with ventral hernia. Reducible. Feels better after reduction. Will discharge home to follow-up with general surgery patient was instructed on how to reduce the hernia herself and what to watch out for to return to the ER.    Benjiman Core, MD 11/28/14 2007

## 2014-12-10 NOTE — H&P (Signed)
  NTS SOAP Note  Vital Signs:  Vitals as of: 12/09/2014: Systolic 158: Diastolic 91: Heart Rate 73: Temp 105F: Height 34ft 4in: Weight 222Lbs 0 Ounces: BMI 38.11  BMI : 38.11 kg/m2  Subjective: This 54 year old female presents for of a ventral hernia.  Was found to have a hernia superior to umbilicus.  Made worse with straining.  Is tender to touch when sticking out.  Review of Symptoms:  Constitutional:unremarkable   Head:unremarkable Eyes:unremarkable   Nose/Mouth/Throat:unremarkable Cardiovascular:  unremarkable Respiratory:unremarkable Gastrointestinal:  unremarkable   Genitourinary:unremarkable   joint pain Skin:unremarkable Hematolgic/Lymphatic:unremarkable   Allergic/Immunologic:unremarkable   Past Medical History:  Reviewed  Past Medical History  Surgical History: BTL Medical Problems: none Allergies: hydrocodone Medications: MVI   Social History:Reviewed  Social History  Preferred Language: English Race:  Black or African American Ethnicity: Not Hispanic / Latino Age: 25 year Marital Status:  S Alcohol: unknown   Smoking Status: Unknown if ever smoked reviewed on 12/09/2014 Functional Status reviewed on 12/09/2014 ------------------------------------------------ Bathing: Normal Cooking: Normal Dressing: Normal Driving: Normal Eating: Normal Managing Meds: Normal Oral Care: Normal Shopping: Normal Toileting: Normal Transferring: Normal Walking: Normal Cognitive Status reviewed on 12/09/2014 ------------------------------------------------ Attention: Normal Decision Making: Normal Language: Normal Memory: Normal Motor: Normal Perception: Normal Problem Solving: Normal Visual and Spatial: Normal   Family History:Reviewed  Family Health History Mother, Healthy;  Father, Healthy;     Objective Information: General:Well appearing, well nourished in no distress. Heart:RRR, no murmur Lungs:  CTA bilaterally, no  wheezes, rhonchi, rales.  Breathing unlabored. Abdomen:Soft, NT/ND, no HSM, no masses.  Elongated reducible ventral hernia along the mildine of abdomen suprio to umbilicus extending towards but not including the epigastrium.  Assessment:Ventral hernia  Diagnoses: 553.8  K43.9 Hernia of anterior abdominal wall without obstruction AND without gangrene (Ventral hernia without obstruction or gangrene)  Procedures: 45409 - OFFICE OUTPATIENT NEW 30 MINUTES    Plan:  Scheduled for laparoscopic ventral herniorrhaphy with mesh on 12/29/14.   Patient Education:Alternative treatments to surgery were discussed with patient (and family).  Risks and benefits  of procedure including bleedingk infection, bowel injury, and recurrence of the hernia were fully explained to the patient (and family) who gave informed consent. Patient/family questions were addressed.  Follow-up:Pending Surgery

## 2014-12-24 NOTE — Patient Instructions (Signed)
Denise Savage  12/24/2014     @   Your procedure is scheduled on  12/29/2014  Report to [facility] at  615  A.M.  Call this number if you have problems the morning of surgery:  [phone]   Remember:  Do not eat food or drink liquids after midnight.  Take these medicines the morning of surgery with A SIP OF WATER  Hydrocodone, zofran.   Do not wear jewelry, make-up or nail polish.  Do not wear lotions, powders, or perfumes.   Do not shave 48 hours prior to surgery.  Men may shave face and neck.  Do not bring valuables to the hospital.  [facility] is not responsible for any belongings or valuables.  Contacts, dentures or bridgework may not be worn into surgery.  Leave your suitcase in the car.  After surgery it may be brought to your room.  For patients admitted to the hospital, discharge time will be determined by your treatment team.  Patients discharged the day of surgery will not be allowed to drive home.   Name and phone number of your driver:   family Special instructions:  none  Please read over the following fact sheets that you were given. Pain Booklet, Coughing and Deep Breathing, Surgical Site Infection Prevention, Anesthesia Post-op Instructions and Care and Recovery After Surgery      Hernia A hernia occurs when an internal organ pushes out through a weak spot in the abdominal wall. Hernias most commonly occur in the groin and around the navel. Hernias often can be pushed back into place (reduced). Most hernias tend to get worse over time. Some abdominal hernias can get stuck in the opening (irreducible or incarcerated hernia) and cannot be reduced. An irreducible abdominal hernia which is tightly squeezed into the opening is at risk for impaired blood supply (strangulated hernia). A strangulated hernia is a medical emergency. Because of the risk for an irreducible or strangulated hernia, surgery may be recommended to repair a  hernia. CAUSES   Heavy lifting.  Prolonged coughing.  Straining to have a bowel movement.  A cut (incision) made during an abdominal surgery. HOME CARE INSTRUCTIONS   Bed rest is not required. You may continue your normal activities.  Avoid lifting more than 10 pounds (4.5 kg) or straining.  Cough gently. If you are a smoker it is best to stop. Even the best hernia repair can break down with the continual strain of coughing. Even if you do not have your hernia repaired, a cough will continue to aggravate the problem.  Do not wear anything tight over your hernia. Do not try to keep it in with an outside bandage or truss. These can damage abdominal contents if they are trapped within the hernia sac.  Eat a normal diet.  Avoid constipation. Straining over long periods of time will increase hernia size and encourage breakdown of repairs. If you cannot do this with diet alone, stool softeners may be used. SEEK IMMEDIATE MEDICAL CARE IF:   You have a fever.  You develop increasing abdominal pain.  You feel nauseous or vomit.  Your hernia is stuck outside the abdomen, looks discolored, feels hard, or is tender.  You have any changes in your bowel habits or in the hernia that are unusual for you.  You have increased pain or swelling around the hernia.  You cannot push the hernia back in place by applying gentle pressure while lying down. MAKE  SURE YOU:   Understand these instructions.  Will watch your condition.  Will get help right away if you are not doing well or get worse. Document Released: 05/30/2005 Document Revised: 08/22/2011 Document Reviewed: 01/17/2008 [facility] Patient Information 2015 [place], [place]. This information is not intended to replace advice given to you by your health care provider. Make sure you discuss any questions you have with your health care provider. PATIENT INSTRUCTIONS POST-ANESTHESIA  IMMEDIATELY FOLLOWING SURGERY:  Do not drive or operate  machinery for the first twenty four hours after surgery.  Do not make any important decisions for twenty four hours after surgery or while taking narcotic pain medications or sedatives.  If you develop intractable nausea and vomiting or a severe headache please notify your doctor immediately.  FOLLOW-UP:  Please make an appointment with your surgeon as instructed. You do not need to follow up with anesthesia unless specifically instructed to do so.  WOUND CARE INSTRUCTIONS (if applicable):  Keep a dry clean dressing on the anesthesia/puncture wound site if there is drainage.  Once the wound has quit draining you may leave it open to air.  Generally you should leave the bandage intact for twenty four hours unless there is drainage.  If the epidural site drains for more than 36-48 hours please call the anesthesia department.  QUESTIONS?:  Please feel free to call your physician or the hospital operator if you have any questions, and they will be happy to assist you.

## 2014-12-25 ENCOUNTER — Encounter (HOSPITAL_COMMUNITY): Payer: Self-pay

## 2014-12-25 ENCOUNTER — Encounter (HOSPITAL_COMMUNITY)
Admission: RE | Admit: 2014-12-25 | Discharge: 2014-12-25 | Disposition: A | Discharge status: Home / Self Care | Payer: BLUE CROSS/BLUE SHIELD | Source: Ambulatory Visit | Attending: General Surgery | Admitting: General Surgery

## 2014-12-25 DIAGNOSIS — K439 Ventral hernia without obstruction or gangrene: Secondary | ICD-10-CM | POA: Diagnosis not present

## 2014-12-25 DIAGNOSIS — Z01818 Encounter for other preprocedural examination: Secondary | ICD-10-CM | POA: Diagnosis present

## 2014-12-25 LAB — BASIC METABOLIC PANEL
Anion gap: 8 (ref 5–15)
BUN: 14 mg/dL (ref 6–20)
CO2: 26 mmol/L (ref 22–32)
Calcium: 10.7 mg/dL — ABNORMAL HIGH (ref 8.9–10.3)
Chloride: 103 mmol/L (ref 101–111)
Creatinine, Ser: 0.81 mg/dL (ref 0.44–1.00)
GFR calc Af Amer: >60 mL/min (ref >60)
GFR calc non Af Amer: >60 mL/min (ref >60)
Glucose, Bld: 100 mg/dL — ABNORMAL HIGH (ref 65–99)
Potassium: 4 mmol/L (ref 3.5–5.1)
Sodium: 137 mmol/L (ref 135–145)

## 2014-12-25 LAB — CBC WITH DIFFERENTIAL/PLATELET
Basophils Absolute: 0 10*3/uL (ref 0.0–0.1)
Basophils Relative: 1 % (ref 0–1)
Eosinophils Absolute: 0.1 10*3/uL (ref 0.0–0.7)
Eosinophils Relative: 2 % (ref 0–5)
HCT: 39.6 % (ref 36.0–46.0)
Hemoglobin: 13 g/dL (ref 12.0–15.0)
Lymphocytes Relative: 39 % (ref 12–46)
Lymphs Abs: 2.2 10*3/uL (ref 0.7–4.0)
MCH: 28.4 pg (ref 26.0–34.0)
MCHC: 32.8 g/dL (ref 30.0–36.0)
MCV: 86.5 fL (ref 78.0–100.0)
Monocytes Absolute: 0.7 10*3/uL (ref 0.1–1.0)
Monocytes Relative: 12 % (ref 3–12)
Neutro Abs: 2.6 10*3/uL (ref 1.7–7.7)
Neutrophils Relative %: 46 % (ref 43–77)
Platelets: 238 10*3/uL (ref 150–400)
RBC: 4.58 MIL/uL (ref 3.87–5.11)
RDW: 14.1 % (ref 11.5–15.5)
WBC: 5.7 10*3/uL (ref 4.0–10.5)

## 2014-12-29 ENCOUNTER — Encounter (HOSPITAL_COMMUNITY): Payer: Self-pay | Admitting: *Deleted

## 2014-12-29 ENCOUNTER — Ambulatory Visit (HOSPITAL_COMMUNITY): Payer: BLUE CROSS/BLUE SHIELD | Admitting: Anesthesiology

## 2014-12-29 ENCOUNTER — Encounter (HOSPITAL_COMMUNITY)
Admission: RE | Disposition: A | Discharge status: Home / Self Care | Payer: Self-pay | Source: Ambulatory Visit | Attending: General Surgery

## 2014-12-29 ENCOUNTER — Ambulatory Visit (HOSPITAL_COMMUNITY)
Admission: RE | Admit: 2014-12-29 | Discharge: 2014-12-29 | Disposition: A | Discharge status: Home / Self Care | Payer: BLUE CROSS/BLUE SHIELD | Source: Ambulatory Visit | Attending: General Surgery | Admitting: General Surgery

## 2014-12-29 DIAGNOSIS — K660 Peritoneal adhesions (postprocedural) (postinfection): Secondary | ICD-10-CM | POA: Insufficient documentation

## 2014-12-29 DIAGNOSIS — K439 Ventral hernia without obstruction or gangrene: Secondary | ICD-10-CM | POA: Diagnosis not present

## 2014-12-29 HISTORY — PX: VENTRAL HERNIA REPAIR: SHX424

## 2014-12-29 SURGERY — REPAIR, HERNIA, VENTRAL, LAPAROSCOPIC
Anesthesia: General | Site: Abdomen

## 2014-12-29 MED ORDER — ONDANSETRON HCL 4 MG/2ML IJ SOLN
4.0000 mg | Freq: Once | INTRAMUSCULAR | Status: AC
  Administered 2014-12-29: 4 mg via INTRAVENOUS
  Filled 2014-12-29: qty 2

## 2014-12-29 MED ORDER — NEOSTIGMINE METHYLSULFATE 10 MG/10ML IV SOLN
INTRAVENOUS | Status: DC | PRN
  Administered 2014-12-29: 4 mg via INTRAVENOUS

## 2014-12-29 MED ORDER — BUPIVACAINE HCL (PF) 0.5 % IJ SOLN
INTRAMUSCULAR | Status: DC | PRN
  Administered 2014-12-29: 10 mL

## 2014-12-29 MED ORDER — SUCCINYLCHOLINE CHLORIDE 20 MG/ML IJ SOLN
INTRAMUSCULAR | Status: AC
  Filled 2014-12-29: qty 1

## 2014-12-29 MED ORDER — FENTANYL CITRATE (PF) 250 MCG/5ML IJ SOLN
INTRAMUSCULAR | Status: DC | PRN
  Administered 2014-12-29: 50 ug via INTRAVENOUS
  Administered 2014-12-29: 25 ug via INTRAVENOUS
  Administered 2014-12-29: 50 ug via INTRAVENOUS
  Administered 2014-12-29: 25 ug via INTRAVENOUS

## 2014-12-29 MED ORDER — KETOROLAC TROMETHAMINE 30 MG/ML IJ SOLN
INTRAMUSCULAR | Status: AC
  Filled 2014-12-29: qty 1

## 2014-12-29 MED ORDER — POVIDONE-IODINE 10 % EX OINT
TOPICAL_OINTMENT | CUTANEOUS | Status: AC
  Filled 2014-12-29: qty 1

## 2014-12-29 MED ORDER — GLYCOPYRROLATE 0.2 MG/ML IJ SOLN
INTRAMUSCULAR | Status: AC
  Filled 2014-12-29: qty 1

## 2014-12-29 MED ORDER — GLYCOPYRROLATE 0.2 MG/ML IJ SOLN
INTRAMUSCULAR | Status: AC
  Filled 2014-12-29: qty 3

## 2014-12-29 MED ORDER — GLYCOPYRROLATE 0.2 MG/ML IJ SOLN
INTRAMUSCULAR | Status: DC | PRN
  Administered 2014-12-29: .7 mg via INTRAVENOUS

## 2014-12-29 MED ORDER — 0.9 % SODIUM CHLORIDE (POUR BTL) OPTIME
TOPICAL | Status: DC | PRN
  Administered 2014-12-29: 1000 mL

## 2014-12-29 MED ORDER — GLYCOPYRROLATE 0.2 MG/ML IJ SOLN
0.2000 mg | Freq: Once | INTRAMUSCULAR | Status: AC
  Administered 2014-12-29: 0.2 mg via INTRAVENOUS

## 2014-12-29 MED ORDER — CEFAZOLIN SODIUM-DEXTROSE 2-3 GM-% IV SOLR
2.0000 g | INTRAVENOUS | Status: AC
  Administered 2014-12-29: 2 g via INTRAVENOUS
  Filled 2014-12-29: qty 50

## 2014-12-29 MED ORDER — DEXAMETHASONE SODIUM PHOSPHATE 4 MG/ML IJ SOLN
4.0000 mg | Freq: Once | INTRAMUSCULAR | Status: AC
  Administered 2014-12-29: 4 mg via INTRAVENOUS

## 2014-12-29 MED ORDER — ROCURONIUM BROMIDE 50 MG/5ML IV SOLN
INTRAVENOUS | Status: AC
  Filled 2014-12-29: qty 1

## 2014-12-29 MED ORDER — LIDOCAINE HCL (PF) 1 % IJ SOLN
INTRAMUSCULAR | Status: AC
  Filled 2014-12-29: qty 5

## 2014-12-29 MED ORDER — OXYCODONE-ACETAMINOPHEN 7.5-325 MG PO TABS: 1.0000 | ORAL_TABLET | ORAL | Status: DC | PRN

## 2014-12-29 MED ORDER — MIDAZOLAM HCL 2 MG/2ML IJ SOLN
1.0000 mg | INTRAMUSCULAR | Status: DC | PRN
  Administered 2014-12-29: 2 mg via INTRAVENOUS

## 2014-12-29 MED ORDER — FENTANYL CITRATE (PF) 250 MCG/5ML IJ SOLN
INTRAMUSCULAR | Status: AC
  Filled 2014-12-29: qty 5

## 2014-12-29 MED ORDER — PROPOFOL 10 MG/ML IV BOLUS
INTRAVENOUS | Status: DC | PRN
  Administered 2014-12-29: 150 mg via INTRAVENOUS

## 2014-12-29 MED ORDER — CHLORHEXIDINE GLUCONATE 4 % EX LIQD: 1.0000 "application " | Freq: Once | CUTANEOUS | Status: DC

## 2014-12-29 MED ORDER — ROCURONIUM BROMIDE 100 MG/10ML IV SOLN
INTRAVENOUS | Status: DC | PRN
  Administered 2014-12-29: 5 mg via INTRAVENOUS
  Administered 2014-12-29: 30 mg via INTRAVENOUS
  Administered 2014-12-29: 15 mg via INTRAVENOUS

## 2014-12-29 MED ORDER — DEXAMETHASONE SODIUM PHOSPHATE 4 MG/ML IJ SOLN
INTRAMUSCULAR | Status: AC
  Filled 2014-12-29: qty 1

## 2014-12-29 MED ORDER — BUPIVACAINE HCL (PF) 0.5 % IJ SOLN
INTRAMUSCULAR | Status: AC
  Filled 2014-12-29: qty 30

## 2014-12-29 MED ORDER — LIDOCAINE HCL (CARDIAC) 20 MG/ML IV SOLN
INTRAVENOUS | Status: DC | PRN
  Administered 2014-12-29: 40 mg via INTRAVENOUS

## 2014-12-29 MED ORDER — SUCCINYLCHOLINE CHLORIDE 20 MG/ML IJ SOLN
INTRAMUSCULAR | Status: DC | PRN
  Administered 2014-12-29: 120 mg via INTRAVENOUS

## 2014-12-29 MED ORDER — KETOROLAC TROMETHAMINE 30 MG/ML IJ SOLN
30.0000 mg | Freq: Once | INTRAMUSCULAR | Status: AC
  Administered 2014-12-29: 30 mg via INTRAVENOUS

## 2014-12-29 MED ORDER — FENTANYL CITRATE (PF) 100 MCG/2ML IJ SOLN
25.0000 ug | INTRAMUSCULAR | Status: AC
  Administered 2014-12-29 (×2): 25 ug via INTRAVENOUS

## 2014-12-29 MED ORDER — MIDAZOLAM HCL 2 MG/2ML IJ SOLN
INTRAMUSCULAR | Status: AC
  Filled 2014-12-29: qty 2

## 2014-12-29 MED ORDER — FENTANYL CITRATE (PF) 100 MCG/2ML IJ SOLN
INTRAMUSCULAR | Status: AC
  Filled 2014-12-29: qty 2

## 2014-12-29 MED ORDER — ONDANSETRON HCL 4 MG/2ML IJ SOLN: 4.0000 mg | Freq: Once | INTRAMUSCULAR | Status: DC | PRN

## 2014-12-29 MED ORDER — LACTATED RINGERS IV SOLN
INTRAVENOUS | Status: DC
  Administered 2014-12-29 (×2): via INTRAVENOUS

## 2014-12-29 MED ORDER — PROPOFOL 10 MG/ML IV BOLUS
INTRAVENOUS | Status: AC
  Filled 2014-12-29: qty 20

## 2014-12-29 MED ORDER — NEOSTIGMINE METHYLSULFATE 10 MG/10ML IV SOLN
INTRAVENOUS | Status: AC
  Filled 2014-12-29: qty 1

## 2014-12-29 MED ORDER — FENTANYL CITRATE (PF) 100 MCG/2ML IJ SOLN
25.0000 ug | INTRAMUSCULAR | Status: DC | PRN
  Administered 2014-12-29 (×2): 50 ug via INTRAVENOUS
  Filled 2014-12-29: qty 2

## 2014-12-29 SURGICAL SUPPLY — 47 items
BAG HAMPER (MISCELLANEOUS) ×2 IMPLANT
CLOSURE STERI STRIP 1/2 X4 (GAUZE/BANDAGES/DRESSINGS) ×1 IMPLANT
CLOTH BEACON ORANGE TIMEOUT ST (SAFETY) ×2 IMPLANT
COVER LIGHT HANDLE STERIS (MISCELLANEOUS) ×4 IMPLANT
DECANTER SPIKE VIAL GLASS SM (MISCELLANEOUS) ×2 IMPLANT
DEVICE TROCAR PUNCTURE CLOSURE (ENDOMECHANICALS) ×2 IMPLANT
DURAPREP 26ML APPLICATOR (WOUND CARE) ×2 IMPLANT
ELECT REM PT RETURN 9FT ADLT (ELECTROSURGICAL) ×2
ELECTRODE REM PT RTRN 9FT ADLT (ELECTROSURGICAL) ×1 IMPLANT
FILTER SMOKE EVAC LAPAROSHD (FILTER) ×2 IMPLANT
GLOVE BIO SURGEON STRL SZ7 (GLOVE) ×1 IMPLANT
GLOVE BIOGEL PI IND STRL 7.0 (GLOVE) IMPLANT
GLOVE BIOGEL PI INDICATOR 7.0 (GLOVE) ×3
GLOVE ECLIPSE 7.0 STRL STRAW (GLOVE) ×1 IMPLANT
GLOVE SURG SS PI 7.5 STRL IVOR (GLOVE) ×4 IMPLANT
GOWN STRL REUS W/TWL LRG LVL3 (GOWN DISPOSABLE) ×6 IMPLANT
INST SET LAPROSCOPIC AP (KITS) ×2 IMPLANT
IV NS IRRIG 3000ML ARTHROMATIC (IV SOLUTION) IMPLANT
KIT ROOM TURNOVER APOR (KITS) ×2 IMPLANT
LIGASURE LAP ATLAS 10MM 37CM (INSTRUMENTS) ×1 IMPLANT
LIQUID BAND (GAUZE/BANDAGES/DRESSINGS) ×2 IMPLANT
MANIFOLD NEPTUNE II (INSTRUMENTS) ×2 IMPLANT
MESH VENTRALIGHT ST 4X6IN (Mesh General) ×1 IMPLANT
NDL INSUFFLATION 14GA 120MM (NEEDLE) ×1 IMPLANT
NEEDLE INSUFFLATION 14GA 120MM (NEEDLE) ×2 IMPLANT
NS IRRIG 1000ML POUR BTL (IV SOLUTION) ×2 IMPLANT
PACK LAP CHOLE LZT030E (CUSTOM PROCEDURE TRAY) ×2 IMPLANT
PAD ARMBOARD 7.5X6 YLW CONV (MISCELLANEOUS) ×2 IMPLANT
PENCIL HANDSWITCHING (ELECTRODE) ×2 IMPLANT
SET BASIN LINEN APH (SET/KITS/TRAYS/PACK) ×2 IMPLANT
SET TUBE IRRIG SUCTION NO TIP (IRRIGATION / IRRIGATOR) IMPLANT
SPONGE GAUZE 2X2 8PLY STRL LF (GAUZE/BANDAGES/DRESSINGS) ×4 IMPLANT
STAPLER VISISTAT (STAPLE) ×2 IMPLANT
STRIP CLOSURE SKIN 1/2X4 (GAUZE/BANDAGES/DRESSINGS) ×2 IMPLANT
SUT NOVA NAB GS-22 2 2-0 T-19 (SUTURE) ×1 IMPLANT
SUT PROLENE 2 0 CR (SUTURE) ×2 IMPLANT
SUT VICRYL 0 UR6 27IN ABS (SUTURE) ×2 IMPLANT
TACKER 5MM HERNIA 3.5CML NAB (ENDOMECHANICALS) ×1 IMPLANT
TAPE CLOTH SURG 4X10 WHT LF (GAUZE/BANDAGES/DRESSINGS) ×2 IMPLANT
TOWEL OR 17X26 4PK STRL BLUE (TOWEL DISPOSABLE) ×2 IMPLANT
TRAY FOLEY CATH SILVER 16FR (SET/KITS/TRAYS/PACK) ×2 IMPLANT
TROCAR ENDO BLADELESS 11MM (ENDOMECHANICALS) ×2 IMPLANT
TROCAR XCEL NON-BLD 5MMX100MML (ENDOMECHANICALS) ×1 IMPLANT
TROCAR XCEL UNIV SLVE 11M 100M (ENDOMECHANICALS) ×4 IMPLANT
TUBING HI FLO HEAT INSUFFLATOR (IRRIGATION / IRRIGATOR) ×2 IMPLANT
WARMER LAPAROSCOPE (MISCELLANEOUS) ×2 IMPLANT
YANKAUER SUCT BULB TIP 10FT TU (MISCELLANEOUS) ×2 IMPLANT

## 2014-12-29 NOTE — Op Note (Signed)
Patient:  Denise Savage  DOB:  06/09/1961  MRN:  [id number]   Preop Diagnosis:  Ventral hernia  Postop Diagnosis:  Same  Procedure:  Laparoscopic ventral herniorrhaphy with mesh  Surgeon:  Franky Macho, M.D.  Anes:  Gen. endotracheal  Indications:  Patient is a 54 year old black female who presents with ventral hernia just superior to the umbilicus. The risks and benefits of the procedure including bleeding, infection, mesh use, and the possibility of recurrence of the hernia were fully explained to the patient, who gave informed consent.  Procedure note:  The patient was placed the supine position. After induction of general endotracheal anesthesia, the abdomen was prepped and draped using the usual sterile technique with DuraPrep. Surgical site confirmation was performed.  I initially attempted to use a Veress needle in the epigastric region. After this attempt failed, a left upper quadrant incision was made and using an 11 mm direct view trocar, the abdominal cavity was entered into without difficulty. The abdomen was then insufflated to 16 mmHg pressure. The bowel and stomach was inspected and there was no evidence of injury noted. An 11 mm trocars were placed in the left lower quadrant region and right lower quadrant region, and a 5 mm trocar placed the right upper quadrant region. The patient had a single adhesion of omentum towards the umbilicus. This was freed away using the LigaSure without difficulty. A 6 x 7 cm midline ventral hernia was noted superior to the umbilicus. Remnants of the hernia sac were excised without difficulty. A 10 x 15 cm Bard ventral X ST patch was then inserted and secured in 4 quadrants using 2-0 Novafil interrupted sutures. A pro-tacker was then used to secure the mesh to the anterior abdominal wall in a circumferential fashion. Good overlap of the edges of the hernia was noted. All fluid and air were then evacuated from the abdominal cavity prior to removal of  the trochars.  All wounds were irrigated with normal saline. All wounds were injected with 0.5% Sensorcaine. The incisions were all closed using staples. Betadine ointment and dry sterile dressings were applied.  All tape and needle counts were correct at the end of the procedure. Patient was extubated in the operating room and transferred to PACU in stable condition.  Complications:  None  EBL:  Minimal  Specimen:  None

## 2014-12-29 NOTE — Anesthesia Postprocedure Evaluation (Signed)
  Anesthesia Post-op Note  Patient: Denise Savage  Procedure(s) Performed: Procedure(s): LAPAROSCOPIC VENTRAL HERNIA WITH MESH (N/A)  Patient Location: Short Stay  Anesthesia Type:General  Level of Consciousness: awake, alert  and oriented  Airway and Oxygen Therapy: Patient Spontanous Breathing  Post-op Pain: mild  Post-op Assessment: Post-op Vital signs reviewed, Patient's Cardiovascular Status Stable, Respiratory Function Stable, Patent Airway, No signs of Nausea or vomiting and Adequate PO intake              Post-op Vital Signs: Reviewed and stable  Last Vitals:  Filed Vitals:   12/29/14 1000  BP: 143/77  Pulse: 72  Temp:   Resp: 17    Complications: No apparent anesthesia complications

## 2014-12-29 NOTE — Discharge Instructions (Signed)
Laparoscopic Ventral Hernia Repair  Care After  Refer to this sheet in the next few weeks. These instructions provide you with information on caring for yourself after your procedure. Your caregiver may also give you more specific instructions. Your treatment has been planned according to current medical practices, but problems sometimes occur. Call your caregiver if you have any problems or questions after your procedure.   HOME CARE INSTRUCTIONS   · Only take over-the-counter or prescription medicines as directed by your caregiver. If antibiotic medicines are prescribed, take them as directed. Finish them even if you start to feel better.  · Always wash your hands before touching your abdomen.  · Take your bandages (dressings) off after 48 hours or as directed by your caregiver. You may have skin adhesive strips or skin glue over the surgical cuts (incisions). Do not take the strips off or peel the glue off. These will fall off on their own.  · Take showers once your caregiver approves. Until then, only take sponge baths. Do not take tub baths or go swimming until your caregiver approves.  · Check your incision area every day for swelling, redness, warmth, and blood or fluid leaking from the incision. These are signs of infection. Wash your hands before you check.  · Hold a pillow over your abdomen when you cough or sneeze to help ease pain.  · Eat foods that are high in fiber, such as whole grains, fruits, and vegetables. Fiber helps prevent difficult bowel movements (constipation).  · Drink enough fluids to keep your urine clear or pale yellow.  · Rest and lessen activity for 4-5 days after the surgery. You may take short walks if your caregiver approves. Do not drive until approved by your caregiver.  · Do not lift anything heavy, participate in sports, or have sexual intercourse for 6-8 weeks or until your caregiver approves.    · Ask your caregiver when you can return to work.  · Keep all follow-up  appointments.  SEEK MEDICAL CARE IF:   · You have pain even after taking pain medicine.  · You have not had a bowel movement in 3 days.  · You have cramps or are nauseous.  SEEK IMMEDIATE MEDICAL CARE IF:   · You have pain or swelling that is getting worse.  · You have redness around your incisions.  · Your incision is pulling apart.  · You have blood or fluid leaking from your incisions.  · You are vomiting.  · You cannot pass urine.  MAKE SURE YOU:   · Understand these instructions.  · Will watch your condition.  · Will get help right away if you are not doing well or get worse.  Document Released: 05/16/2012 Document Reviewed: 05/16/2012  [employer]® Patient Information ©2015 [employer]. This information is not intended to replace advice given to you by your health care provider. Make sure you discuss any questions you have with your health care provider.

## 2014-12-29 NOTE — Anesthesia Preprocedure Evaluation (Signed)
Anesthesia Evaluation  Patient identified by MRN, date of birth, ID band Patient awake    Reviewed: Allergy & Precautions, NPO status , Patient's Chart, lab work & pertinent test results  Airway Mallampati: II  TM Distance: >3 FB     Dental  (+) Teeth Intact, Dental Advisory Given   Pulmonary neg pulmonary ROS,  breath sounds clear to auscultation        Cardiovascular negative cardio ROS  Rhythm:Regular Rate:Normal     Neuro/Psych    GI/Hepatic Nausea this AM.   Endo/Other    Renal/GU      Musculoskeletal   Abdominal   Peds  Hematology   Anesthesia Other Findings   Reproductive/Obstetrics                             Anesthesia Physical Anesthesia Plan  ASA: II  Anesthesia Plan: General   Post-op Pain Management:    Induction: Intravenous, Rapid sequence and Cricoid pressure planned  Airway Management Planned: Oral ETT  Additional Equipment:   Intra-op Plan:   Post-operative Plan: Extubation in OR  Informed Consent: I have reviewed the patients History and Physical, chart, labs and discussed the procedure including the risks, benefits and alternatives for the proposed anesthesia with the patient or authorized representative who has indicated his/her understanding and acceptance.     Plan Discussed with:   Anesthesia Plan Comments:         Anesthesia Quick Evaluation

## 2014-12-29 NOTE — Transfer of Care (Signed)
Immediate Anesthesia Transfer of Care Note  Patient: Denise Savage  Procedure(s) Performed: Procedure(s): LAPAROSCOPIC VENTRAL HERNIA WITH MESH (N/A)  Patient Location: PACU  Anesthesia Type:General  Level of Consciousness: sedated  Airway & Oxygen Therapy: Patient Spontanous Breathing and Patient connected to face mask oxygen  Post-op Assessment: Report given to RN and Post -op Vital signs reviewed and stable  Post vital signs: Reviewed and stable  Last Vitals:  Filed Vitals:   12/29/14 0700  BP: 158/89  Temp:   Resp: 14    Complications: No apparent anesthesia complications

## 2014-12-29 NOTE — Interval H&P Note (Signed)
History and Physical Interval Note:  12/29/2014 7:14 AM  Denise Savage  has presented today for surgery, with the diagnosis of ventral hernia  The various methods of treatment have been discussed with the patient and family. After consideration of risks, benefits and other options for treatment, the patient has consented to  Procedure(s): LAPAROSCOPIC VENTRAL HERNIA WITH MESH (N/A) as a surgical intervention .  The patient's history has been reviewed, patient examined, no change in status, stable for surgery.  I have reviewed the patient's chart and labs.  Questions were answered to the patient's satisfaction.     Franky Macho A

## 2014-12-29 NOTE — Anesthesia Procedure Notes (Signed)
Procedure Name: Intubation Date/Time: 12/29/2014 7:40 AM Performed by: Glynn Octave E Pre-anesthesia Checklist: Patient identified, Patient being monitored, Timeout performed, Emergency Drugs available and Suction available Patient Re-evaluated:Patient Re-evaluated prior to inductionOxygen Delivery Method: Circle System Utilized Preoxygenation: Pre-oxygenation with 100% oxygen Intubation Type: IV induction, Rapid sequence and Cricoid Pressure applied Ventilation: Mask ventilation without difficulty Laryngoscope Size: Mac and 3 Grade View: Grade I Tube type: Oral Tube size: 7.0 mm Number of attempts: 1 Airway Equipment and Method: Stylet Placement Confirmation: ETT inserted through vocal cords under direct vision,  positive ETCO2 and breath sounds checked- equal and bilateral Secured at: 21 cm Tube secured with: Tape Dental Injury: Teeth and Oropharynx as per pre-operative assessment

## 2014-12-30 ENCOUNTER — Encounter (HOSPITAL_COMMUNITY): Payer: Self-pay | Admitting: General Surgery

## 2015-07-30 ENCOUNTER — Ambulatory Visit (INDEPENDENT_AMBULATORY_CARE_PROVIDER_SITE_OTHER): Payer: BLUE CROSS/BLUE SHIELD | Admitting: Nurse Practitioner

## 2015-07-30 ENCOUNTER — Encounter: Payer: Self-pay | Admitting: Nurse Practitioner

## 2015-07-30 VITALS — BP 124/80 | Temp 98.5°F | Ht 64.0 in | Wt 226.0 lb

## 2015-07-30 DIAGNOSIS — J300 Vasomotor rhinitis: Secondary | ICD-10-CM

## 2015-07-30 DIAGNOSIS — H6123 Impacted cerumen, bilateral: Secondary | ICD-10-CM

## 2015-07-30 NOTE — Patient Instructions (Signed)
OTC antihistamine Nasacort AQ as directed Hydrogen peroxide mixed 1:1 with warm water

## 2015-07-31 ENCOUNTER — Encounter: Payer: Self-pay | Admitting: Nurse Practitioner

## 2015-07-31 NOTE — Progress Notes (Signed)
Subjective:   Presents for complaints of her right ear feeling stopped up for the past 6 days. No fever. No sore throat headache runny nose or cough. No actual ear pain. Has tried warm olive oil and swimmer's drops with no relief.  Objective:   BP 124/80 mmHg  Temp(Src) 98.5 F (36.9 C) (Oral)  Ht 5\' 4"  (1.626 m)  Wt 226 lb (102.513 kg)  BMI 38.77 kg/m2  NAD. Alert, oriented. Both TMs are mostly obscured with dark cerumen, a tiny amount of TM can be visualized which is non-erythematous. Pharynx minimally injected with clear PND noted. Neck supple with moderate soft nontender anterior adenopathy. Lungs clear. Heart regular rate rhythm.  Assessment: Vasomotor rhinitis  Cerumen impaction, bilateral  Plan: OTC antihistamine Nasacort AQ as directed Hydrogen peroxide mixed 1:1 with warm water  reviewed measures to help with head congestion as well as cerumen impaction. Patient to call back in 7-10 days if no improvement , sooner if any problems.

## 2015-08-19 ENCOUNTER — Telehealth: Payer: Self-pay | Admitting: Family Medicine

## 2015-08-19 DIAGNOSIS — H6123 Impacted cerumen, bilateral: Secondary | ICD-10-CM

## 2015-08-19 NOTE — Telephone Encounter (Signed)
Pt seen Denise Savage recently about her ear being stopped up. Pt was told that if it didn't open back up that [facility] would refer her to an ent.

## 2015-08-20 NOTE — Telephone Encounter (Signed)
Please refer to ENT of her choice for bilat cerumen impaction and probable effusion.

## 2015-08-20 NOTE — Telephone Encounter (Signed)
Referral ordered in EPIC. Patient notified. 

## 2015-08-24 ENCOUNTER — Encounter: Payer: Self-pay | Admitting: Family Medicine

## 2015-11-27 ENCOUNTER — Ambulatory Visit (INDEPENDENT_AMBULATORY_CARE_PROVIDER_SITE_OTHER): Payer: BLUE CROSS/BLUE SHIELD | Admitting: Family Medicine

## 2015-11-27 ENCOUNTER — Encounter: Payer: Self-pay | Admitting: Family Medicine

## 2015-11-27 ENCOUNTER — Ambulatory Visit (HOSPITAL_COMMUNITY)
Admission: RE | Admit: 2015-11-27 | Discharge: 2015-11-27 | Disposition: A | Discharge status: Home / Self Care | Payer: BLUE CROSS/BLUE SHIELD | Source: Ambulatory Visit | Attending: Family Medicine | Admitting: Family Medicine

## 2015-11-27 ENCOUNTER — Other Ambulatory Visit (HOSPITAL_COMMUNITY)
Admission: RE | Admit: 2015-11-27 | Discharge: 2015-11-27 | Disposition: A | Discharge status: Home / Self Care | Payer: BLUE CROSS/BLUE SHIELD | Source: Ambulatory Visit | Attending: Family Medicine | Admitting: Family Medicine

## 2015-11-27 VITALS — BP 140/88 | Temp 98.1°F | Ht 64.0 in | Wt 230.4 lb

## 2015-11-27 DIAGNOSIS — M79605 Pain in left leg: Secondary | ICD-10-CM | POA: Diagnosis not present

## 2015-11-27 LAB — D-DIMER, QUANTITATIVE (NOT AT ARMC): D-Dimer, Quant: <0.27 ug/mL-FEU (ref 0.00–0.50)

## 2015-11-27 MED ORDER — ETODOLAC 400 MG PO TABS: 400.0000 mg | ORAL_TABLET | Freq: Two times a day (BID) | ORAL | Status: DC

## 2015-11-27 NOTE — Progress Notes (Signed)
   Subjective:    Patient ID: Denise Savage, female    DOB: Mar 07, 1961, 55 y.o.   MRN: [id number]  Foot Pain This is a new problem. The current episode started 1 to 4 weeks ago. The problem occurs intermittently. The problem has been unchanged. Nothing aggravates the symptoms. She has tried acetaminophen and NSAIDs for the symptoms. The treatment provided no relief.   Patient states that she has no other concerns at this time.   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 Doing a lot of standing with over time work. Pain under the heel and foot. But also extending into the calf. Mild swollen at times. Pain first thing in the morning nonsmoker no history of DVTs  Review of Systems No headache, no major weight loss or weight gain, no chest pain no back pain abdominal pain no change in bowel habits complete ROS otherwise negative     Objective:   Physical Exam  Alert no acute distress. Lungs clear. Heart rare rhythm left Positive Homans sign positive tenderness trace to 1+ edema left versus right some heel tenderness pulses sensation intact      Assessment & Plan:  Likely plantar fascitis with extension into Achilles component but need to rule out clot in calf discussed plan urgent ultrasound blood work anti-inflammatory medication in case test negative work excuse local measures discussed multiple questions answered 25 minutes spent most in discussion

## 2016-08-22 ENCOUNTER — Ambulatory Visit (INDEPENDENT_AMBULATORY_CARE_PROVIDER_SITE_OTHER): Payer: BLUE CROSS/BLUE SHIELD | Admitting: Otolaryngology

## 2020-06-15 ENCOUNTER — Encounter (HOSPITAL_COMMUNITY): Payer: Self-pay | Admitting: Emergency Medicine

## 2020-06-15 ENCOUNTER — Other Ambulatory Visit: Payer: Self-pay

## 2020-06-15 ENCOUNTER — Emergency Department (HOSPITAL_COMMUNITY): Payer: Medicaid Other

## 2020-06-15 ENCOUNTER — Inpatient Hospital Stay (HOSPITAL_COMMUNITY)
Admission: EM | Admit: 2020-06-15 | Discharge: 2020-09-10 | DRG: 003 | Disposition: A | Discharge status: Skilled Nursing Facility | Payer: Medicaid Other | Attending: Neurosurgery | Admitting: Neurosurgery

## 2020-06-15 DIAGNOSIS — A4150 Gram-negative sepsis, unspecified: Secondary | ICD-10-CM | POA: Diagnosis not present

## 2020-06-15 DIAGNOSIS — R739 Hyperglycemia, unspecified: Secondary | ICD-10-CM | POA: Diagnosis not present

## 2020-06-15 DIAGNOSIS — R1310 Dysphagia, unspecified: Secondary | ICD-10-CM | POA: Diagnosis not present

## 2020-06-15 DIAGNOSIS — K439 Ventral hernia without obstruction or gangrene: Secondary | ICD-10-CM | POA: Diagnosis not present

## 2020-06-15 DIAGNOSIS — Z978 Presence of other specified devices: Secondary | ICD-10-CM

## 2020-06-15 DIAGNOSIS — I63512 Cerebral infarction due to unspecified occlusion or stenosis of left middle cerebral artery: Secondary | ICD-10-CM | POA: Diagnosis not present

## 2020-06-15 DIAGNOSIS — R140 Abdominal distension (gaseous): Secondary | ICD-10-CM

## 2020-06-15 DIAGNOSIS — E870 Hyperosmolality and hypernatremia: Secondary | ICD-10-CM | POA: Diagnosis not present

## 2020-06-15 DIAGNOSIS — Z79899 Other long term (current) drug therapy: Secondary | ICD-10-CM

## 2020-06-15 DIAGNOSIS — T80818A Extravasation of other vesicant agent, initial encounter: Secondary | ICD-10-CM | POA: Diagnosis not present

## 2020-06-15 DIAGNOSIS — G9349 Other encephalopathy: Secondary | ICD-10-CM | POA: Diagnosis not present

## 2020-06-15 DIAGNOSIS — D649 Anemia, unspecified: Secondary | ICD-10-CM | POA: Diagnosis not present

## 2020-06-15 DIAGNOSIS — Z791 Long term (current) use of non-steroidal anti-inflammatories (NSAID): Secondary | ICD-10-CM

## 2020-06-15 DIAGNOSIS — Z885 Allergy status to narcotic agent status: Secondary | ICD-10-CM | POA: Diagnosis not present

## 2020-06-15 DIAGNOSIS — E162 Hypoglycemia, unspecified: Secondary | ICD-10-CM | POA: Diagnosis not present

## 2020-06-15 DIAGNOSIS — J690 Pneumonitis due to inhalation of food and vomit: Secondary | ICD-10-CM | POA: Diagnosis not present

## 2020-06-15 DIAGNOSIS — J9690 Respiratory failure, unspecified, unspecified whether with hypoxia or hypercapnia: Secondary | ICD-10-CM

## 2020-06-15 DIAGNOSIS — G4710 Hypersomnia, unspecified: Secondary | ICD-10-CM | POA: Diagnosis present

## 2020-06-15 DIAGNOSIS — I609 Nontraumatic subarachnoid hemorrhage, unspecified: Secondary | ICD-10-CM

## 2020-06-15 DIAGNOSIS — J9621 Acute and chronic respiratory failure with hypoxia: Secondary | ICD-10-CM | POA: Diagnosis not present

## 2020-06-15 DIAGNOSIS — J9602 Acute respiratory failure with hypercapnia: Secondary | ICD-10-CM

## 2020-06-15 DIAGNOSIS — G8191 Hemiplegia, unspecified affecting right dominant side: Secondary | ICD-10-CM | POA: Diagnosis not present

## 2020-06-15 DIAGNOSIS — E876 Hypokalemia: Secondary | ICD-10-CM | POA: Diagnosis not present

## 2020-06-15 DIAGNOSIS — Y95 Nosocomial condition: Secondary | ICD-10-CM | POA: Diagnosis not present

## 2020-06-15 DIAGNOSIS — R519 Headache, unspecified: Secondary | ICD-10-CM | POA: Diagnosis present

## 2020-06-15 DIAGNOSIS — I952 Hypotension due to drugs: Secondary | ICD-10-CM | POA: Diagnosis not present

## 2020-06-15 DIAGNOSIS — R4701 Aphasia: Secondary | ICD-10-CM | POA: Diagnosis not present

## 2020-06-15 DIAGNOSIS — B961 Klebsiella pneumoniae [K. pneumoniae] as the cause of diseases classified elsewhere: Secondary | ICD-10-CM | POA: Diagnosis not present

## 2020-06-15 DIAGNOSIS — T8130XA Disruption of wound, unspecified, initial encounter: Secondary | ICD-10-CM | POA: Diagnosis not present

## 2020-06-15 DIAGNOSIS — I619 Nontraumatic intracerebral hemorrhage, unspecified: Secondary | ICD-10-CM | POA: Diagnosis present

## 2020-06-15 DIAGNOSIS — Z20822 Contact with and (suspected) exposure to covid-19: Secondary | ICD-10-CM | POA: Diagnosis present

## 2020-06-15 DIAGNOSIS — Z6839 Body mass index (BMI) 39.0-39.9, adult: Secondary | ICD-10-CM | POA: Diagnosis not present

## 2020-06-15 DIAGNOSIS — Z95828 Presence of other vascular implants and grafts: Secondary | ICD-10-CM

## 2020-06-15 DIAGNOSIS — G936 Cerebral edema: Secondary | ICD-10-CM | POA: Diagnosis not present

## 2020-06-15 DIAGNOSIS — E049 Nontoxic goiter, unspecified: Secondary | ICD-10-CM | POA: Diagnosis not present

## 2020-06-15 DIAGNOSIS — I82409 Acute embolism and thrombosis of unspecified deep veins of unspecified lower extremity: Secondary | ICD-10-CM

## 2020-06-15 DIAGNOSIS — I6012 Nontraumatic subarachnoid hemorrhage from left middle cerebral artery: Principal | ICD-10-CM | POA: Diagnosis present

## 2020-06-15 DIAGNOSIS — J9600 Acute respiratory failure, unspecified whether with hypoxia or hypercapnia: Secondary | ICD-10-CM

## 2020-06-15 DIAGNOSIS — G8220 Paraplegia, unspecified: Secondary | ICD-10-CM | POA: Diagnosis not present

## 2020-06-15 DIAGNOSIS — I608 Other nontraumatic subarachnoid hemorrhage: Secondary | ICD-10-CM | POA: Diagnosis present

## 2020-06-15 LAB — I-STAT CHEM 8, ED
BUN: 17 mg/dL (ref 6–20)
Calcium, Ion: 1.44 mmol/L — ABNORMAL HIGH (ref 1.15–1.40)
Chloride: 103 mmol/L (ref 98–111)
Creatinine, Ser: 0.9 mg/dL (ref 0.44–1.00)
Glucose, Bld: 90 mg/dL (ref 70–99)
HCT: 42 % (ref 36.0–46.0)
Hemoglobin: 14.3 g/dL (ref 12.0–15.0)
Potassium: 3.6 mmol/L (ref 3.5–5.1)
Sodium: 139 mmol/L (ref 135–145)
TCO2: 29 mmol/L (ref 22–32)

## 2020-06-15 LAB — I-STAT BETA HCG BLOOD, ED (MC, WL, AP ONLY): I-stat hCG, quantitative: <5 m[IU]/mL (ref <5)

## 2020-06-15 MED ORDER — METOCLOPRAMIDE HCL 5 MG/ML IJ SOLN
5.0000 mg | Freq: Once | INTRAMUSCULAR | Status: AC
  Administered 2020-06-15: 5 mg via INTRAVENOUS
  Filled 2020-06-15: qty 2

## 2020-06-15 MED ORDER — DIPHENHYDRAMINE HCL 50 MG/ML IJ SOLN
25.0000 mg | Freq: Once | INTRAMUSCULAR | Status: AC
  Administered 2020-06-15: 25 mg via INTRAVENOUS
  Filled 2020-06-15: qty 1

## 2020-06-15 MED ORDER — NIMODIPINE 30 MG PO CAPS
60.0000 mg | ORAL_CAPSULE | ORAL | Status: DC
  Administered 2020-06-15 – 2020-06-16 (×4): 60 mg via ORAL
  Filled 2020-06-15 (×15): qty 2

## 2020-06-15 MED ORDER — IOHEXOL 350 MG/ML SOLN
100.0000 mL | Freq: Once | INTRAVENOUS | Status: AC | PRN
  Administered 2020-06-15: 100 mL via INTRAVENOUS

## 2020-06-15 MED ORDER — SODIUM CHLORIDE 0.9 % IV BOLUS
500.0000 mL | Freq: Once | INTRAVENOUS | Status: AC
  Administered 2020-06-15: 500 mL via INTRAVENOUS

## 2020-06-15 NOTE — ED Provider Notes (Addendum)
[facility] EMERGENCY DEPARTMENT Provider Note   CSN: [id number] Arrival date & time: 06/15/20  2774     History Chief Complaint  Patient presents with  . Headache    Denise Savage is a 60 y.o. female reports is otherwise healthy no daily medication use.  Patient presents today for a headache onset 4 days ago she was walking to her kitchen when she had sudden onset headache began in the front of her head radiates to the back of her head and slightly down her neck it is severe constant sharp minimally improved with ibuprofen no aggravating factors.  She denies similar headaches in the past.  Denies fever/chills, fall/injury, vision changes, vomiting, diarrhea, neck stiffness, chest pain/shortness of breath, cough, abdominal pain, numbness/tingling, weakness, balance issues, difficulty speaking or any additional concerns  HPI     Past Medical History:  Diagnosis Date  . Abdominal hernia     There are no problems to display for this patient.   Past Surgical History:  Procedure Laterality Date  . TUBAL LIGATION    . VENTRAL HERNIA REPAIR N/A 12/29/2014   Procedure: LAPAROSCOPIC VENTRAL HERNIA WITH MESH;  Surgeon: Franky Macho, MD;  Location: [facility];  Service: General;  Laterality: N/A;     OB History    Gravida  3   Para  3   Term  3   Preterm      AB      Living  3     SAB      IAB      Ectopic      Multiple      Live Births              History reviewed. No pertinent family history.  Social History   Tobacco Use  . Smoking status: Never Smoker  . Smokeless tobacco: Never Used  Substance Use Topics  . Alcohol use: No  . Drug use: No    Home Medications Prior to Admission medications   Medication Sig Start Date End Date Taking? Authorizing Provider  acetaminophen (TYLENOL) 500 MG tablet Take 1,000 mg by mouth every 6 (six) hours as needed.   Yes [provider]  APPLE CIDER VINEGAR PO Take 1 tablet by mouth daily at 12 noon.   Yes  [provider]  Ascorbic Acid (VITAMIN C) 100 MG tablet Take 100 mg by mouth daily.   Yes [provider]  Aspirin-Acetaminophen-Caffeine (EXCEDRIN PO) Take 1 tablet by mouth daily as needed.   Yes [provider]  Calcium 150 MG TABS Take 1 tablet by mouth daily.   Yes [provider]  Cholecalciferol (VITAMIN D3) 1.25 MG (50000 UT) CAPS Take 1 tablet by mouth daily.   Yes [provider]  doxylamine, Sleep, (UNISOM) 25 MG tablet Take 25 mg by mouth at bedtime as needed.   Yes [provider]  Misc Natural Products (TURMERIC CURCUMIN) CAPS Take 1 capsule by mouth daily.   Yes [provider]  Multiple Vitamins-Minerals (CENTRUM ADULTS PO) Take 1 tablet by mouth daily.   Yes [provider]  naproxen sodium (ALEVE) 220 MG tablet Take 220 mg by mouth.   Yes [provider]  zinc gluconate 50 MG tablet Take 50 mg by mouth daily.   Yes [provider]  etodolac (LODINE) 400 MG tablet Take 1 tablet (400 mg total) by mouth 2 (two) times daily. With food Patient not taking: No sig reported 11/27/15   Ardyth Gal  S, MD    Allergies    Hydrocodone  Review of Systems   Review of Systems Ten systems are reviewed and are negative for acute change except as noted in the HPI  Physical Exam Updated Vital Signs BP (!) 174/86 (BP Location: Right Arm)   Pulse 61   Temp 98.4 F (36.9 C) (Oral)   Resp 19   Ht 5\' 4"  (1.626 m)   Wt 100.7 kg   SpO2 99%   BMI 38.11 kg/m   Physical Exam Constitutional:      General: She is not in acute distress.    Appearance: Normal appearance. She is well-developed. She is not ill-appearing or diaphoretic.  HENT:     Head: Normocephalic and atraumatic.  Eyes:     General: Vision grossly intact. Gaze aligned appropriately.     Extraocular Movements: Extraocular movements intact.     Pupils: Pupils are equal, round, and reactive to light.  Neck:     Trachea: Trachea and  phonation normal.     Meningeal: Brudzinski's sign absent.  Pulmonary:     Effort: Pulmonary effort is normal. No respiratory distress.  Abdominal:     General: There is no distension.     Palpations: Abdomen is soft.     Tenderness: There is no abdominal tenderness. There is no guarding or rebound.  Musculoskeletal:        General: Normal range of motion.     Cervical back: Normal range of motion and neck supple.  Skin:    General: Skin is warm and dry.  Neurological:     Mental Status: She is alert.     GCS: GCS eye subscore is 4. GCS verbal subscore is 5. GCS motor subscore is 6.     Comments: Speech is clear and goal oriented, follows commands Major Cranial nerves without deficit, no facial droop Moves extremities without ataxia, coordination intact  Psychiatric:        Behavior: Behavior normal.     ED Results / Procedures / Treatments   Labs (all labs ordered are listed, but only abnormal results are displayed) Labs Reviewed  I-STAT CHEM 8, ED - Abnormal; Notable for the following components:      Result Value   Calcium, Ion 1.44 (*)    All other components within normal limits  I-STAT BETA HCG BLOOD, ED ([facility], [facility], [facility] ONLY)    EKG None  Radiology No results found.  Procedures Procedures (including critical care time)  Medications Ordered in ED Medications  metoCLOPramide (REGLAN) injection 5 mg (5 mg Intravenous Given 06/15/20 1944)  diphenhydrAMINE (BENADRYL) injection 25 mg (25 mg Intravenous Given 06/15/20 1944)  sodium chloride 0.9 % bolus 500 mL (0 mLs Intravenous Stopped 06/15/20 2025)    ED Course  I have reviewed the triage vital signs and the nursing notes.  Pertinent labs & imaging results that were available during my care of the patient were reviewed by me and considered in my medical decision making (see chart for details).    MDM Rules/Calculators/A&P                         Additional history obtained from: 1. Nursing notes from this  visit. ---------- 60 year old female presents with headache x4 days sudden onset not similar to previous headache.  Cranial nerves intact, no meningismus.  Airway clear.  No history of fever, nausea/vomiting, vision changes or any additional concerns.  Based on patient history and age  will obtain CT imaging.  Discussed case with Dr. Regenia Skeeter, will obtain CTA head neck and give migraine cocktail and continue to monitor patient. - Patient reassessment, resting comfortably in bed no acute distress reports some improvement in headache following migraine cocktail. - Care handoff given to Tammy Triplett PA-C at shift change.  Plan of care is to follow-up on pending imaging, reassessment.  Pending no acute findings and improvement of headache likely discharge.  Final disposition per oncoming team.  Note: Portions of this report may have been transcribed using voice recognition software. Every effort was made to ensure accuracy; however, inadvertent computerized transcription errors may still be present. Final Clinical Impression(s) / ED Diagnoses Final diagnoses:  None    Rx / DC Orders ED Discharge Orders    None       Gari Crown 06/15/20 2041    Gari Crown 06/15/20 2044    Sherwood Gambler, MD 06/18/20 0930

## 2020-06-15 NOTE — ED Provider Notes (Signed)
2110 patient signed out to me by Denise Alpha, PA-C pending completion of work-up.  Patient is a 60 year old female that is here for headache has been persistent for several days.  Headache was described as severe and sudden in onset, no significant relief from over-the-counter medications.  CTA head and neck ordered.   On my exam, patient is well-appearing.  Speech clear, she ambulates in the department with steady gait.  No focal neuro deficits.  She was given migraine cocktail by previous provider with only brief, minimal improvement.  2248 contacted by radiologist, patient has subarachnoid bleed and aneurysm at left MCA.     CT Angio Head W or Wo Contrast  Result Date: 06/15/2020 CLINICAL DATA:  Initial evaluation for acute headache, dizziness. EXAM: CT ANGIOGRAPHY HEAD AND NECK TECHNIQUE: Multidetector CT imaging of the head and neck was performed using the standard protocol during bolus administration of intravenous contrast. Multiplanar CT image reconstructions and MIPs were obtained to evaluate the vascular anatomy. Carotid stenosis measurements (when applicable) are obtained utilizing NASCET criteria, using the distal internal carotid diameter as the denominator. CONTRAST:  126mL OMNIPAQUE IOHEXOL 350 MG/ML SOLN COMPARISON:  None. FINDINGS: CT HEAD FINDINGS Brain: Generalized age-related cerebral atrophy with mild chronic small vessel ischemic disease. Small volume acute subarachnoid hemorrhage seen centered about the base of the left sylvian fissure. No other acute intracranial hemorrhage. No acute large vessel territory infarct. No mass lesion, midline shift or mass effect. No hydrocephalus or extra-axial fluid collection. Vascular: No visible hyperdense vessel. Skull: Scalp soft tissues and calvarium within normal limits. Sinuses: Visualized paranasal sinuses are clear. Orbits: Globes and orbital soft tissues demonstrate no acute finding. Review of the MIP images confirms the above  findings CTA NECK FINDINGS Aortic arch: Visualized aortic arch of normal caliber with normal branch pattern. Mild atheromatous change within the arch itself. No hemodynamically significant stenosis about the origin the great vessels. Right carotid system: Right common and internal carotid arteries widely patent without stenosis, dissection or occlusion. Left carotid system: Left common and internal carotid arteries widely patent without stenosis, dissection or occlusion. Vertebral arteries: Both vertebral arteries arise from the subclavian arteries. No proximal subclavian artery stenosis. Both vertebral arteries widely patent without stenosis, dissection or occlusion. Skeleton: No acute osseous abnormality. No discrete or worrisome osseous lesions. Other neck: No other acute soft tissue abnormality within the neck. 4 mm left thyroid nodule noted, felt to be of doubtful significance given size and patient age. No follow-up imaging recommended regarding this lesion. No other mass or adenopathy. Upper chest: Visualized upper chest demonstrates no acute finding. Review of the MIP images confirms the above findings CTA HEAD FINDINGS Anterior circulation: Petrous segments widely patent bilaterally. Mild atheromatous change within the carotid siphons without hemodynamically significant stenosis. A1 segments widely patent. Normal anterior communicating artery complex. Anterior cerebral arteries widely patent to their distal aspects without stenosis. No M1 stenosis or occlusion. There is an irregular aneurysm arising at the left MCA bifurcation, measuring 6 x 6 x 9 mm, likely the source of subarachnoid hemorrhage. This projects inferiorly and laterally. Right MCA bifurcation within normal limits. Distal MCA branches well perfused and symmetric. Posterior circulation: Both V4 segments widely patent to the vertebrobasilar junction. Both PICA origins patent and normal. Basilar widely patent to its distal aspect. Superior  cerebral arteries patent bilaterally. Both PCAs primarily supplied via the basilar well perfused to their distal aspects. Venous sinuses: Patent. Anatomic variants: None significant. Review of the MIP images confirms the above  findings IMPRESSION: CT HEAD IMPRESSION: 1. Small volume acute subarachnoid hemorrhage centered at the base of the left sylvian fissure. 2. Underlying age-related cerebral atrophy with mild chronic small vessel ischemic disease. CTA HEAD AND NECK IMPRESSION: 1. 6 x 6 x 9 mm irregular aneurysm arising from the left MCA bifurcation, likely recently ruptured in the source of the subarachnoid hemorrhage. 2. Otherwise negative CTA of the head and neck. No large vessel occlusion, hemodynamically significant stenosis, or other acute vascular abnormality. Critical Value/emergent results were called by telephone at the time of interpretation on 06/15/2020 at 10:43 pm to provider Pauline Aus, PA , who verbally acknowledged these results. Electronically Signed   By: Rise Mu M.D.   On: 06/15/2020 22:47   CT Angio Neck W and/or Wo Contrast  Result Date: 06/15/2020 CLINICAL DATA:  Initial evaluation for acute headache, dizziness. EXAM: CT ANGIOGRAPHY HEAD AND NECK TECHNIQUE: Multidetector CT imaging of the head and neck was performed using the standard protocol during bolus administration of intravenous contrast. Multiplanar CT image reconstructions and MIPs were obtained to evaluate the vascular anatomy. Carotid stenosis measurements (when applicable) are obtained utilizing NASCET criteria, using the distal internal carotid diameter as the denominator. CONTRAST:  OMNIPAQUE IOHEXOL 350 MG/ML SOLN COMPARISON:  None. FINDINGS: CT HEAD FINDINGS Brain: Generalized age-related cerebral atrophy with mild chronic small vessel ischemic disease. Small volume acute subarachnoid hemorrhage seen centered about the base of the left sylvian fissure. No other acute intracranial hemorrhage. No acute  large vessel territory infarct. No mass lesion, midline shift or mass effect. No hydrocephalus or extra-axial fluid collection. Vascular: No visible hyperdense vessel. Skull: Scalp soft tissues and calvarium within normal limits. Sinuses: Visualized paranasal sinuses are clear. Orbits: Globes and orbital soft tissues demonstrate no acute finding. Review of the MIP images confirms the above findings CTA NECK FINDINGS Aortic arch: Visualized aortic arch of normal caliber with normal branch pattern. Mild atheromatous change within the arch itself. No hemodynamically significant stenosis about the origin the great vessels. Right carotid system: Right common and internal carotid arteries widely patent without stenosis, dissection or occlusion. Left carotid system: Left common and internal carotid arteries widely patent without stenosis, dissection or occlusion. Vertebral arteries: Both vertebral arteries arise from the subclavian arteries. No proximal subclavian artery stenosis. Both vertebral arteries widely patent without stenosis, dissection or occlusion. Skeleton: No acute osseous abnormality. No discrete or worrisome osseous lesions. Other neck: No other acute soft tissue abnormality within the neck. 4 mm left thyroid nodule noted, felt to be of doubtful significance given size and patient age. No follow-up imaging recommended regarding this lesion. No other mass or adenopathy. Upper chest: Visualized upper chest demonstrates no acute finding. Review of the MIP images confirms the above findings CTA HEAD FINDINGS Anterior circulation: Petrous segments widely patent bilaterally. Mild atheromatous change within the carotid siphons without hemodynamically significant stenosis. A1 segments widely patent. Normal anterior communicating artery complex. Anterior cerebral arteries widely patent to their distal aspects without stenosis. No M1 stenosis or occlusion. There is an irregular aneurysm arising at the left MCA  bifurcation, measuring 6 x 6 x 9 mm, likely the source of subarachnoid hemorrhage. This projects inferiorly and laterally. Right MCA bifurcation within normal limits. Distal MCA branches well perfused and symmetric. Posterior circulation: Both V4 segments widely patent to the vertebrobasilar junction. Both PICA origins patent and normal. Basilar widely patent to its distal aspect. Superior cerebral arteries patent bilaterally. Both PCAs primarily supplied via the basilar well perfused to their  distal aspects. Venous sinuses: Patent. Anatomic variants: None significant. Review of the MIP images confirms the above findings IMPRESSION: CT HEAD IMPRESSION: 1. Small volume acute subarachnoid hemorrhage centered at the base of the left sylvian fissure. 2. Underlying age-related cerebral atrophy with mild chronic small vessel ischemic disease. CTA HEAD AND NECK IMPRESSION: 1. 6 x 6 x 9 mm irregular aneurysm arising from the left MCA bifurcation, likely recently ruptured in the source of the subarachnoid hemorrhage. 2. Otherwise negative CTA of the head and neck. No large vessel occlusion, hemodynamically significant stenosis, or other acute vascular abnormality. Critical Value/emergent results were called by telephone at the time of interpretation on 06/15/2020 at 10:43 pm to provider Pauline Aus, PA , who verbally acknowledged these results. Electronically Signed   By: Rise Mu M.D.   On: 06/15/2020 22:47    Will consult neurosurgery.  Consulted, Dr. Maurice Small and discussed findings.  He recommends nimodipine 60 mg every 4 hours and transfer patient to [facility] [address]. at [facility] to his service.    Pauline Aus, PA-C 06/15/20 2338    Pricilla Loveless, MD 06/18/20 [phone]

## 2020-06-15 NOTE — ED Triage Notes (Signed)
Pt c/o headache that started 12/31. Pt states shes been taking alive/ibuprofen but hasnt given any relief.

## 2020-06-15 NOTE — ED Notes (Signed)
Pt ambulated to bathroom without difficulty. No c/o dizziness, SOB or chest pain.

## 2020-06-15 NOTE — ED Notes (Signed)
Pt transported to CT ?

## 2020-06-15 NOTE — ED Notes (Signed)
Pt returned from CT °

## 2020-06-16 ENCOUNTER — Encounter (HOSPITAL_COMMUNITY)
Admission: EM | Disposition: A | Discharge status: Skilled Nursing Facility | Payer: Self-pay | Source: Home / Self Care | Attending: Neurosurgery

## 2020-06-16 ENCOUNTER — Inpatient Hospital Stay (HOSPITAL_COMMUNITY): Payer: Medicaid Other

## 2020-06-16 ENCOUNTER — Inpatient Hospital Stay (HOSPITAL_COMMUNITY): Payer: Medicaid Other | Admitting: Anesthesiology

## 2020-06-16 DIAGNOSIS — Z6839 Body mass index (BMI) 39.0-39.9, adult: Secondary | ICD-10-CM | POA: Diagnosis not present

## 2020-06-16 DIAGNOSIS — T80818A Extravasation of other vesicant agent, initial encounter: Secondary | ICD-10-CM | POA: Diagnosis not present

## 2020-06-16 DIAGNOSIS — R519 Headache, unspecified: Secondary | ICD-10-CM | POA: Diagnosis present

## 2020-06-16 DIAGNOSIS — I608 Other nontraumatic subarachnoid hemorrhage: Secondary | ICD-10-CM | POA: Diagnosis not present

## 2020-06-16 DIAGNOSIS — I67848 Other cerebrovascular vasospasm and vasoconstriction: Secondary | ICD-10-CM | POA: Diagnosis not present

## 2020-06-16 DIAGNOSIS — I63512 Cerebral infarction due to unspecified occlusion or stenosis of left middle cerebral artery: Secondary | ICD-10-CM | POA: Diagnosis not present

## 2020-06-16 DIAGNOSIS — B961 Klebsiella pneumoniae [K. pneumoniae] as the cause of diseases classified elsewhere: Secondary | ICD-10-CM | POA: Diagnosis not present

## 2020-06-16 DIAGNOSIS — Z20822 Contact with and (suspected) exposure to covid-19: Secondary | ICD-10-CM | POA: Diagnosis not present

## 2020-06-16 DIAGNOSIS — G9349 Other encephalopathy: Secondary | ICD-10-CM | POA: Diagnosis not present

## 2020-06-16 DIAGNOSIS — J189 Pneumonia, unspecified organism: Secondary | ICD-10-CM | POA: Diagnosis not present

## 2020-06-16 DIAGNOSIS — E876 Hypokalemia: Secondary | ICD-10-CM | POA: Diagnosis not present

## 2020-06-16 DIAGNOSIS — I609 Nontraumatic subarachnoid hemorrhage, unspecified: Secondary | ICD-10-CM | POA: Diagnosis not present

## 2020-06-16 DIAGNOSIS — Z79899 Other long term (current) drug therapy: Secondary | ICD-10-CM | POA: Diagnosis not present

## 2020-06-16 DIAGNOSIS — J9600 Acute respiratory failure, unspecified whether with hypoxia or hypercapnia: Secondary | ICD-10-CM | POA: Diagnosis not present

## 2020-06-16 DIAGNOSIS — G9340 Encephalopathy, unspecified: Secondary | ICD-10-CM | POA: Diagnosis not present

## 2020-06-16 DIAGNOSIS — A4150 Gram-negative sepsis, unspecified: Secondary | ICD-10-CM | POA: Diagnosis not present

## 2020-06-16 DIAGNOSIS — I6012 Nontraumatic subarachnoid hemorrhage from left middle cerebral artery: Secondary | ICD-10-CM | POA: Diagnosis not present

## 2020-06-16 DIAGNOSIS — G8220 Paraplegia, unspecified: Secondary | ICD-10-CM | POA: Diagnosis not present

## 2020-06-16 DIAGNOSIS — J9621 Acute and chronic respiratory failure with hypoxia: Secondary | ICD-10-CM | POA: Diagnosis not present

## 2020-06-16 DIAGNOSIS — I671 Cerebral aneurysm, nonruptured: Secondary | ICD-10-CM | POA: Diagnosis not present

## 2020-06-16 DIAGNOSIS — Z791 Long term (current) use of non-steroidal anti-inflammatories (NSAID): Secondary | ICD-10-CM | POA: Diagnosis not present

## 2020-06-16 DIAGNOSIS — I428 Other cardiomyopathies: Secondary | ICD-10-CM | POA: Diagnosis not present

## 2020-06-16 DIAGNOSIS — J690 Pneumonitis due to inhalation of food and vomit: Secondary | ICD-10-CM | POA: Diagnosis not present

## 2020-06-16 DIAGNOSIS — G8191 Hemiplegia, unspecified affecting right dominant side: Secondary | ICD-10-CM | POA: Diagnosis not present

## 2020-06-16 DIAGNOSIS — Z930 Tracheostomy status: Secondary | ICD-10-CM | POA: Diagnosis not present

## 2020-06-16 DIAGNOSIS — T8130XA Disruption of wound, unspecified, initial encounter: Secondary | ICD-10-CM | POA: Diagnosis not present

## 2020-06-16 DIAGNOSIS — R1310 Dysphagia, unspecified: Secondary | ICD-10-CM | POA: Diagnosis not present

## 2020-06-16 DIAGNOSIS — Z885 Allergy status to narcotic agent status: Secondary | ICD-10-CM | POA: Diagnosis not present

## 2020-06-16 DIAGNOSIS — R4701 Aphasia: Secondary | ICD-10-CM | POA: Diagnosis not present

## 2020-06-16 DIAGNOSIS — D649 Anemia, unspecified: Secondary | ICD-10-CM | POA: Diagnosis not present

## 2020-06-16 DIAGNOSIS — R739 Hyperglycemia, unspecified: Secondary | ICD-10-CM | POA: Diagnosis not present

## 2020-06-16 DIAGNOSIS — E878 Other disorders of electrolyte and fluid balance, not elsewhere classified: Secondary | ICD-10-CM | POA: Diagnosis not present

## 2020-06-16 DIAGNOSIS — Y95 Nosocomial condition: Secondary | ICD-10-CM | POA: Diagnosis not present

## 2020-06-16 DIAGNOSIS — J9601 Acute respiratory failure with hypoxia: Secondary | ICD-10-CM | POA: Diagnosis not present

## 2020-06-16 DIAGNOSIS — R569 Unspecified convulsions: Secondary | ICD-10-CM | POA: Diagnosis not present

## 2020-06-16 DIAGNOSIS — R0689 Other abnormalities of breathing: Secondary | ICD-10-CM | POA: Diagnosis not present

## 2020-06-16 DIAGNOSIS — G936 Cerebral edema: Secondary | ICD-10-CM | POA: Diagnosis not present

## 2020-06-16 DIAGNOSIS — E870 Hyperosmolality and hypernatremia: Secondary | ICD-10-CM | POA: Diagnosis not present

## 2020-06-16 DIAGNOSIS — A419 Sepsis, unspecified organism: Secondary | ICD-10-CM | POA: Diagnosis not present

## 2020-06-16 DIAGNOSIS — E049 Nontoxic goiter, unspecified: Secondary | ICD-10-CM | POA: Diagnosis present

## 2020-06-16 HISTORY — PX: IR CT HEAD LTD: IMG2386

## 2020-06-16 HISTORY — PX: RADIOLOGY WITH ANESTHESIA: SHX6223

## 2020-06-16 HISTORY — PX: IR NEURO EACH ADD'L AFTER BASIC UNI RIGHT (MS): IMG5374

## 2020-06-16 HISTORY — PX: IR TRANSCATH/EMBOLIZ: IMG695

## 2020-06-16 HISTORY — PX: IR US GUIDE VASC ACCESS RIGHT: IMG2390

## 2020-06-16 HISTORY — PX: IR ANGIO INTRA EXTRACRAN SEL INTERNAL CAROTID BILAT MOD SED: IMG5363

## 2020-06-16 HISTORY — PX: IR ANGIOGRAM FOLLOW UP STUDY: IMG697

## 2020-06-16 HISTORY — PX: IR ANGIO VERTEBRAL SEL VERTEBRAL UNI R MOD SED: IMG5368

## 2020-06-16 LAB — POCT I-STAT 7, (LYTES, BLD GAS, ICA,H+H)
Acid-Base Excess: 1 mmol/L (ref 0.0–2.0)
Bicarbonate: 25.8 mmol/L (ref 20.0–28.0)
Calcium, Ion: 1.43 mmol/L — ABNORMAL HIGH (ref 1.15–1.40)
HCT: 38 % (ref 36.0–46.0)
Hemoglobin: 12.9 g/dL (ref 12.0–15.0)
O2 Saturation: 100 %
Patient temperature: 99.3
Potassium: 3.4 mmol/L — ABNORMAL LOW (ref 3.5–5.1)
Sodium: 139 mmol/L (ref 135–145)
TCO2: 27 mmol/L (ref 22–32)
pCO2 arterial: 42.2 mmHg (ref 32.0–48.0)
pH, Arterial: 7.396 (ref 7.350–7.450)
pO2, Arterial: 203 mmHg — ABNORMAL HIGH (ref 83.0–108.0)

## 2020-06-16 LAB — CBC
HCT: 40.4 % (ref 36.0–46.0)
HCT: 37.7 % (ref 36.0–46.0)
Hemoglobin: 13.3 g/dL (ref 12.0–15.0)
Hemoglobin: 12.8 g/dL (ref 12.0–15.0)
MCH: 28.5 pg (ref 26.0–34.0)
MCH: 29.2 pg (ref 26.0–34.0)
MCHC: 32.9 g/dL (ref 30.0–36.0)
MCHC: 34 g/dL (ref 30.0–36.0)
MCV: 86.5 fL (ref 80.0–100.0)
MCV: 85.9 fL (ref 80.0–100.0)
Platelets: 248 10*3/uL (ref 150–400)
Platelets: 232 10*3/uL (ref 150–400)
RBC: 4.67 MIL/uL (ref 3.87–5.11)
RBC: 4.39 MIL/uL (ref 3.87–5.11)
RDW: 13.8 % (ref 11.5–15.5)
RDW: 13.8 % (ref 11.5–15.5)
WBC: 7.9 10*3/uL (ref 4.0–10.5)
WBC: 9.2 10*3/uL (ref 4.0–10.5)
nRBC: 0 % (ref 0.0–0.2)
nRBC: 0 % (ref 0.0–0.2)

## 2020-06-16 LAB — BASIC METABOLIC PANEL
Anion gap: 9 (ref 5–15)
Anion gap: 8 (ref 5–15)
BUN: 10 mg/dL (ref 6–20)
BUN: 11 mg/dL (ref 6–20)
CO2: 26 mmol/L (ref 22–32)
CO2: 23 mmol/L (ref 22–32)
Calcium: 10.8 mg/dL — ABNORMAL HIGH (ref 8.9–10.3)
Calcium: 10.1 mg/dL (ref 8.9–10.3)
Chloride: 100 mmol/L (ref 98–111)
Chloride: 104 mmol/L (ref 98–111)
Creatinine, Ser: 0.8 mg/dL (ref 0.44–1.00)
Creatinine, Ser: 0.81 mg/dL (ref 0.44–1.00)
GFR, Estimated: >60 mL/min (ref >60)
GFR, Estimated: >60 mL/min (ref >60)
Glucose, Bld: 101 mg/dL — ABNORMAL HIGH (ref 70–99)
Glucose, Bld: 156 mg/dL — ABNORMAL HIGH (ref 70–99)
Potassium: 3.5 mmol/L (ref 3.5–5.1)
Potassium: 3.4 mmol/L — ABNORMAL LOW (ref 3.5–5.1)
Sodium: 135 mmol/L (ref 135–145)
Sodium: 135 mmol/L (ref 135–145)

## 2020-06-16 LAB — RESP PANEL BY RT-PCR (FLU A&B, COVID) ARPGX2
Influenza A by PCR: NEGATIVE
Influenza B by PCR: NEGATIVE
SARS Coronavirus 2 by RT PCR: NEGATIVE

## 2020-06-16 LAB — SURGICAL PCR SCREEN
MRSA, PCR: NEGATIVE
Staphylococcus aureus: NEGATIVE

## 2020-06-16 LAB — PROTIME-INR
INR: 1.1 (ref 0.8–1.2)
Prothrombin Time: 13.9 seconds (ref 11.4–15.2)

## 2020-06-16 LAB — APTT: aPTT: 30 seconds (ref 24–36)

## 2020-06-16 LAB — GLUCOSE, CAPILLARY: Glucose-Capillary: 143 mg/dL — ABNORMAL HIGH (ref 70–99)

## 2020-06-16 SURGERY — IR WITH ANESTHESIA
Anesthesia: General

## 2020-06-16 MED ORDER — FENTANYL CITRATE (PF) 100 MCG/2ML IJ SOLN: 50.0000 ug | INTRAMUSCULAR | Status: DC | PRN

## 2020-06-16 MED ORDER — HEPARIN SODIUM (PORCINE) 1000 UNIT/ML IJ SOLN
INTRAMUSCULAR | Status: DC | PRN
  Administered 2020-06-16: 3000 [IU] via INTRAVENOUS

## 2020-06-16 MED ORDER — ORAL CARE MOUTH RINSE
15.0000 mL | OROMUCOSAL | Status: DC
  Administered 2020-06-17 – 2020-09-10 (×794): 15 mL via OROMUCOSAL

## 2020-06-16 MED ORDER — HYDROCODONE-ACETAMINOPHEN 5-325 MG PO TABS: 1.0000 | ORAL_TABLET | ORAL | Status: DC | PRN

## 2020-06-16 MED ORDER — ACETAMINOPHEN 650 MG RE SUPP: 650.0000 mg | RECTAL | Status: DC | PRN

## 2020-06-16 MED ORDER — LABETALOL HCL 5 MG/ML IV SOLN
10.0000 mg | INTRAVENOUS | Status: DC | PRN
  Administered 2020-06-19: 40 mg via INTRAVENOUS
  Administered 2020-07-24: 10 mg via INTRAVENOUS
  Administered 2020-08-08: 20 mg via INTRAVENOUS
  Administered 2020-09-02 – 2020-09-03 (×3): 10 mg via INTRAVENOUS
  Administered 2020-09-04: 20 mg via INTRAVENOUS
  Filled 2020-06-16 (×4): qty 4
  Filled 2020-06-16: qty 8
  Filled 2020-06-16: qty 4

## 2020-06-16 MED ORDER — POTASSIUM CHLORIDE 20 MEQ PO PACK
40.0000 meq | PACK | Freq: Once | ORAL | Status: AC
  Administered 2020-06-17: 40 meq
  Filled 2020-06-16: qty 2

## 2020-06-16 MED ORDER — HYDROMORPHONE HCL 1 MG/ML IJ SOLN: 0.5000 mg | Freq: Once | INTRAMUSCULAR | Status: DC

## 2020-06-16 MED ORDER — CLEVIDIPINE BUTYRATE 0.5 MG/ML IV EMUL
INTRAVENOUS | Status: DC | PRN
  Administered 2020-06-16: 2 mg/h via INTRAVENOUS

## 2020-06-16 MED ORDER — ROCURONIUM BROMIDE 10 MG/ML (PF) SYRINGE
PREFILLED_SYRINGE | INTRAVENOUS | Status: DC | PRN
  Administered 2020-06-16: 80 mg via INTRAVENOUS
  Administered 2020-06-16: 50 mg via INTRAVENOUS
  Administered 2020-06-16: 20 mg via INTRAVENOUS

## 2020-06-16 MED ORDER — MIDAZOLAM HCL 2 MG/2ML IJ SOLN
INTRAMUSCULAR | Status: DC | PRN
  Administered 2020-06-16: 2 mg via INTRAVENOUS

## 2020-06-16 MED ORDER — POTASSIUM CHLORIDE CRYS ER 20 MEQ PO TBCR: 40.0000 meq | EXTENDED_RELEASE_TABLET | Freq: Once | ORAL | Status: DC

## 2020-06-16 MED ORDER — LACTATED RINGERS IV SOLN: INTRAVENOUS | Status: DC | PRN

## 2020-06-16 MED ORDER — PANTOPRAZOLE SODIUM 40 MG IV SOLR
40.0000 mg | Freq: Every day | INTRAVENOUS | Status: DC
  Administered 2020-06-16 – 2020-06-18 (×3): 40 mg via INTRAVENOUS
  Filled 2020-06-16 (×3): qty 40

## 2020-06-16 MED ORDER — HYDROMORPHONE HCL 1 MG/ML IJ SOLN
INTRAMUSCULAR | Status: AC
  Administered 2020-06-16: 0.5 mg via INTRAVENOUS
  Filled 2020-06-16: qty 1

## 2020-06-16 MED ORDER — IOHEXOL 300 MG/ML  SOLN
150.0000 mL | Freq: Once | INTRAMUSCULAR | Status: AC | PRN
  Administered 2020-06-16: 25 mL via INTRA_ARTERIAL

## 2020-06-16 MED ORDER — PROTAMINE SULFATE 10 MG/ML IV SOLN
INTRAVENOUS | Status: DC | PRN
  Administered 2020-06-16 (×2): 20 mg via INTRAVENOUS

## 2020-06-16 MED ORDER — DEXAMETHASONE SODIUM PHOSPHATE 10 MG/ML IJ SOLN
INTRAMUSCULAR | Status: DC | PRN
  Administered 2020-06-16: 10 mg via INTRAVENOUS

## 2020-06-16 MED ORDER — INSULIN ASPART 100 UNIT/ML ~~LOC~~ SOLN
0.0000 [IU] | SUBCUTANEOUS | Status: DC
  Administered 2020-06-17: 2 [IU] via SUBCUTANEOUS
  Administered 2020-06-17: 5 [IU] via SUBCUTANEOUS
  Administered 2020-06-17 – 2020-06-18 (×4): 1 [IU] via SUBCUTANEOUS
  Administered 2020-06-18: 2 [IU] via SUBCUTANEOUS
  Administered 2020-06-18: 23:00:00 1 [IU] via SUBCUTANEOUS
  Administered 2020-06-18: 2 [IU] via SUBCUTANEOUS
  Administered 2020-06-18 – 2020-07-03 (×25): 1 [IU] via SUBCUTANEOUS

## 2020-06-16 MED ORDER — POLYETHYLENE GLYCOL 3350 17 G PO PACK: 17.0000 g | PACK | Freq: Every day | ORAL | Status: DC | PRN

## 2020-06-16 MED ORDER — PHENYLEPHRINE HCL-NACL 10-0.9 MG/250ML-% IV SOLN
INTRAVENOUS | Status: DC | PRN
  Administered 2020-06-16: 50 ug/min via INTRAVENOUS

## 2020-06-16 MED ORDER — NIMODIPINE 6 MG/ML PO SOLN
60.0000 mg | ORAL | Status: DC
  Administered 2020-06-16 – 2020-06-22 (×32): 60 mg
  Filled 2020-06-16 (×29): qty 10

## 2020-06-16 MED ORDER — ACETAMINOPHEN 325 MG PO TABS: 650.0000 mg | ORAL_TABLET | ORAL | Status: DC | PRN

## 2020-06-16 MED ORDER — FENTANYL CITRATE (PF) 250 MCG/5ML IJ SOLN
INTRAMUSCULAR | Status: DC | PRN
  Administered 2020-06-16: 25 ug via INTRAVENOUS
  Administered 2020-06-16: 100 ug via INTRAVENOUS
  Administered 2020-06-16: 25 ug via INTRAVENOUS

## 2020-06-16 MED ORDER — NITROGLYCERIN 1 MG/10 ML FOR IR/CATH LAB
INTRA_ARTERIAL | Status: AC
  Filled 2020-06-16: qty 10

## 2020-06-16 MED ORDER — MUPIROCIN 2 % EX OINT: 1.0000 "application " | TOPICAL_OINTMENT | Freq: Two times a day (BID) | CUTANEOUS | Status: DC

## 2020-06-16 MED ORDER — PROPOFOL 10 MG/ML IV BOLUS
INTRAVENOUS | Status: DC | PRN
  Administered 2020-06-16: 180 mg via INTRAVENOUS

## 2020-06-16 MED ORDER — SODIUM CHLORIDE 0.9 % IV SOLN: INTRAVENOUS | Status: DC | PRN

## 2020-06-16 MED ORDER — LIDOCAINE 2% (20 MG/ML) 5 ML SYRINGE
INTRAMUSCULAR | Status: DC | PRN
  Administered 2020-06-16: 80 mg via INTRAVENOUS

## 2020-06-16 MED ORDER — FENTANYL CITRATE (PF) 250 MCG/5ML IJ SOLN
INTRAMUSCULAR | Status: AC
  Filled 2020-06-16: qty 5

## 2020-06-16 MED ORDER — PROPOFOL 1000 MG/100ML IV EMUL
0.0000 ug/kg/min | INTRAVENOUS | Status: DC
  Administered 2020-06-16: 50 ug/kg/min via INTRAVENOUS
  Administered 2020-06-17: 30 ug/kg/min via INTRAVENOUS
  Filled 2020-06-16 (×2): qty 100

## 2020-06-16 MED ORDER — CHLORHEXIDINE GLUCONATE CLOTH 2 % EX PADS
6.0000 | MEDICATED_PAD | Freq: Every day | CUTANEOUS | Status: DC
  Administered 2020-06-16 – 2020-06-28 (×12): 6 via TOPICAL

## 2020-06-16 MED ORDER — HYDROMORPHONE HCL 1 MG/ML IJ SOLN
0.5000 mg | INTRAMUSCULAR | Status: DC | PRN
  Administered 2020-06-16: 0.5 mg via INTRAVENOUS
  Filled 2020-06-16: qty 1

## 2020-06-16 MED ORDER — DOCUSATE SODIUM 100 MG PO CAPS
100.0000 mg | ORAL_CAPSULE | Freq: Two times a day (BID) | ORAL | Status: DC
  Filled 2020-06-16: qty 1

## 2020-06-16 MED ORDER — MORPHINE SULFATE (PF) 4 MG/ML IV SOLN
4.0000 mg | Freq: Once | INTRAVENOUS | Status: AC
  Administered 2020-06-16: 4 mg via INTRAVENOUS
  Filled 2020-06-16: qty 1

## 2020-06-16 MED ORDER — ONDANSETRON HCL 4 MG PO TABS: 4.0000 mg | ORAL_TABLET | ORAL | Status: DC | PRN

## 2020-06-16 MED ORDER — PROPOFOL 500 MG/50ML IV EMUL
INTRAVENOUS | Status: DC | PRN
  Administered 2020-06-16: 75 ug/kg/min via INTRAVENOUS

## 2020-06-16 MED ORDER — MIDAZOLAM HCL 2 MG/2ML IJ SOLN
INTRAMUSCULAR | Status: AC
  Filled 2020-06-16: qty 2

## 2020-06-16 MED ORDER — CLEVIDIPINE BUTYRATE 0.5 MG/ML IV EMUL
INTRAVENOUS | Status: AC
  Administered 2020-06-16: 2 mg/h via INTRAVENOUS
  Filled 2020-06-16: qty 50

## 2020-06-16 MED ORDER — ONDANSETRON HCL 4 MG/2ML IJ SOLN
4.0000 mg | INTRAMUSCULAR | Status: DC | PRN
  Administered 2020-06-16 – 2020-07-02 (×2): 4 mg via INTRAVENOUS
  Filled 2020-06-16 (×2): qty 2

## 2020-06-16 MED ORDER — IOHEXOL 300 MG/ML  SOLN
150.0000 mL | Freq: Once | INTRAMUSCULAR | Status: AC | PRN
  Administered 2020-06-16: 100 mL via INTRA_ARTERIAL

## 2020-06-16 MED ORDER — ACETAMINOPHEN 325 MG PO TABS
650.0000 mg | ORAL_TABLET | ORAL | Status: DC | PRN
  Administered 2020-06-19 – 2020-09-04 (×30): 650 mg
  Filled 2020-06-16 (×32): qty 2

## 2020-06-16 MED ORDER — CLEVIDIPINE BUTYRATE 0.5 MG/ML IV EMUL
0.0000 mg/h | INTRAVENOUS | Status: DC
  Administered 2020-06-17: 14 mg/h via INTRAVENOUS
  Administered 2020-06-17: 10 mg/h via INTRAVENOUS
  Administered 2020-06-17: 7 mg/h via INTRAVENOUS
  Filled 2020-06-16 (×4): qty 50

## 2020-06-16 MED ORDER — CHLORHEXIDINE GLUCONATE 0.12% ORAL RINSE (MEDLINE KIT)
15.0000 mL | Freq: Two times a day (BID) | OROMUCOSAL | Status: DC
  Administered 2020-06-16 – 2020-09-10 (×169): 15 mL via OROMUCOSAL

## 2020-06-16 NOTE — Progress Notes (Signed)
Anesthesia present for case, pt intubated and sedated, grace crna

## 2020-06-16 NOTE — ED Notes (Signed)
Pt arrived from [facility]. No significant event during transportation.  Pain c/o head and neck pain 7/10.   Able to stand and ambulate from stretcher to bed.  Alert and oriented, does not appear in distress Skin is warm, dry and intact

## 2020-06-16 NOTE — H&P (Addendum)
Neurosurgery H&P  CC: Headache  HPI: This is a 60 y.o. woman that presents after being awoken from sleep with the worst headache of her life. Also endorses neck pain, never had Sx like these before, no fam h/o aneurysms, headache is severe in intensity, sharp, holocephalic, worse with flexion / extension of the neck. No new weakness, numbness, or parasthesias, no recent change in bowel or bladder function. No recent use of anti-platelet or anti-coagulant medications.   ROS: A 14 point ROS was performed and is negative except as noted in the HPI.   PMHx:  Past Medical History:  Diagnosis Date  . Abdominal hernia    FamHx: History reviewed. No pertinent family history. SocHx:  reports that she has never smoked. She has never used smokeless tobacco. She reports that she does not drink alcohol and does not use drugs.  Exam: Vital signs in last 24 hours: Temp:  [98.4 F (36.9 C)-98.7 F (37.1 C)] 98.7 F (37.1 C) (01/04 0318) Pulse Rate:  [61-86] 61 (01/04 0630) Resp:  [16-22] 17 (01/04 0630) BP: (110-182)/(57-103) 134/82 (01/04 0630) SpO2:  [93 %-100 %] 97 % (01/04 0630) Weight:  [100.7 kg] 100.7 kg (01/03 1829) General: Awake, alert, cooperative, lying in bed in NAD Head: Normocephalic and atruamatic HEENT: Neck stiff Pulmonary: breathing room air comfortably, no evidence of increased work of breathing Cardiac: RRR Abdomen: S NT ND Extremities: Warm and well perfused x4 Neuro: AOx3, PERRL, EOMI, FS Strength 5/5 x4, SILTx4, no drift   Assessment and Plan: 60 y.o. woman w/ WHOL. CTH/CTA head personally reviewed, which show L sylvian fissure SAH with left MCA bifurcation aneurysm, c/w ruptured intracranial aneurysm. Graded upon arrival as Hunt-Hess grade 1, modified Fisher grade 1.   -admit to 4N ICU -SBP<160 -nimodipine -NPO for OR vs angio today for clipping vs coiling w/ Dr. Conchita Paris -SCDs/TEDs  Jadene Pierini, MD 06/16/20 7:37 AM [facility]

## 2020-06-16 NOTE — Consult Note (Signed)
NAME:  Denise Savage, MRN:  [id number], DOB:  1961/04/11, LOS: 0 ADMISSION DATE:  06/15/2020, CONSULTATION DATE:  06/16/20 REFERRING MD:  Dr. Zada Finders, CHIEF COMPLAINT:  VDRF   Brief History:  60 year old female presented with subarachnoid hemorrhage.  Went for coiling complicated by intraoperative aneurysmal rupture and subsequent severe vasospasm.  Remained intubated postoperatively.  History of Present Illness:  Patient is encephalopathic and/or intubated. Therefore history has been obtained from chart review.   60 year old female with no significant past medical history, on no daily medications, who presented to [facility] emergency department on 1/3 with headache x4 days which progressed to wake her from sleep with worst headache of life.  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  Neurosurgery was consulted and recommended transfer to [facility].  Shortly after arrival to [facility] she was taken for diagnostic angiogram and coiling. Operative course complicated by intraoperative rupture and subsequent severe vasospasm.  Postoperatively she remained on the mechanical ventilator was transferred to 4 N. ICU.  PCCM was consulted for ventilator and medical management.  Past Medical History:   has a past medical history of Abdominal hernia.  Significant Hospital Events:  1/3 present to [facility] emergency department 1/4 transferred to [facility] and taken to neuro IR for coiling.  Remained on vent postop  Consults:    Procedures:  1/4 diagnostic angiogram with coiling  Significant Diagnostic Tests:  1/3 CT angiogram head neck > small volume acute subarachnoid hemorrhage centered at the base of the left sylvian fissure.  6 x 6 x 9 mm irregular aneurysm arising from the left MCA bifurcation.  Micro Data:    Antimicrobials:     Interim  History / Subjective:    Objective   Blood pressure (!) 90/51, pulse 74, temperature 99 F (37.2 C), temperature source Oral, resp. rate 16, height 5\' 4"  (1.626 m), weight 100.7 kg, SpO2 100 %.    Vent Mode: PRVC FiO2 (%):  [100 %] 100 % Set Rate:  [16 bmp] 16 bmp Vt Set:  [430 mL] 430 mL PEEP:  [5 cmH20] 5 cmH20 Plateau Pressure:  [12 cmH20] 12 cmH20   Intake/Output Summary (Last 24 hours) at 06/16/2020 2121 Last data filed at 06/16/2020 2002 Gross per 24 hour  Intake 1000 ml  Output 350 ml  Net 650 ml   Filed Weights   06/15/20 1829  Weight: 100.7 kg    Examination: General: Overweight middle-aged female on vent HENT: Normocephalic, atraumatic, PERRL Lungs: Clear bilateral breath sounds Cardiovascular: Regular rate and rhythm Abdomen: Soft, nondistended, hypoactive Extremities: No acute deformity, no edema Neuro: Sedated  Resolved Hospital Problem list     Assessment & Plan:   Subarachnoid hemorrhage at the left sylvian fissure secondary to left MCA bifurcation aneurysm.  Status post coiling 1/4 complicated by intraoperative aneurysmal rupture.  -Management per neurosurgery -Systolic blood pressure goal less than 140 mmHg -Clevidipine infusion for blood pressure goal -Neurosurgery has recommended we achieve deeper sedation overnight tonight. -Propofol infusion.  Fentanyl for RASS goal -2 to -3 -Repeat CBC, BMP in this postoperative setting.  Acute hypoxemic respiratory failure secondary to above -Full vent support -ABG, post intubation CXR pending -VAP bundle -Would await neurosurgery guidance prior to weaning sedation, vent    Best practice (evaluated daily)  Diet: N.p.o. Pain/Anxiety/Delirium protocol (if indicated): Propofol, as needed fentanyl, RASS goal as above VAP protocol (if indicated): Per protocol  DVT prophylaxis: Per primary GI prophylaxis: PPI Glucose control: Check glucose on chemistries Mobility: Bedrest Disposition: ICU  Goals of Care:   Last date of multidisciplinary goals of care discussion: Family and staff present:  Summary of discussion:  Follow up goals of care discussion due: 1/11 Code Status:   Labs   CBC: Recent Labs  Lab 06/15/20 1959 06/16/20 1420  WBC  --  7.9  HGB 14.3 13.3  HCT 42.0 40.4  MCV  --  86.5  PLT  --  248    Basic Metabolic Panel: Recent Labs  Lab 06/15/20 1959 06/16/20 1420  NA 139 135  K 3.6 3.5  CL 103 100  CO2  --  26  GLUCOSE 90 101*  BUN 17 10  CREATININE 0.90 0.80  CALCIUM  --  10.8*   GFR: Estimated Creatinine Clearance: 86.3 mL/min (by C-G formula based on SCr of 0.8 mg/dL). Recent Labs  Lab 06/16/20 1420  WBC 7.9    Liver Function Tests: No results for input(s): AST, ALT, ALKPHOS, BILITOT, PROT, ALBUMIN in the last 168 hours. No results for input(s): LIPASE, AMYLASE in the last 168 hours. No results for input(s): AMMONIA in the last 168 hours.  ABG    Component Value Date/Time   TCO2 29 06/15/2020 1959     Coagulation Profile: Recent Labs  Lab 06/16/20 1420  INR 1.1    Cardiac Enzymes: No results for input(s): CKTOTAL, CKMB, CKMBINDEX, TROPONINI in the last 168 hours.  HbA1C: No results found for: HGBA1C  CBG: No results for input(s): GLUCAP in the last 168 hours.  Review of Systems:   Unable as patient is encephalopathic and/or intubated  Past Medical History:  She,  has a past medical history of Abdominal hernia.   Surgical History:   Past Surgical History:  Procedure Laterality Date  . IR TRANSCATH/EMBOLIZ  06/16/2020  . TUBAL LIGATION    . VENTRAL HERNIA REPAIR N/A 12/29/2014   Procedure: LAPAROSCOPIC VENTRAL HERNIA WITH MESH;  Surgeon: Franky Macho, MD;  Location: [facility];  Service: General;  Laterality: N/A;     Social History:   reports that she has never smoked. She has never used smokeless tobacco. She reports that she does not drink alcohol and does not use drugs.   Family History:  Her family history is not on file.    Allergies Allergies  Allergen Reactions  . Hydrocodone Nausea Only     Home Medications  Prior to Admission medications   Medication Sig Start Date End Date Taking? Authorizing Provider  acetaminophen (TYLENOL) 500 MG tablet Take 1,000 mg by mouth every 6 (six) hours as needed.   Yes [provider]  APPLE CIDER VINEGAR PO Take 1 tablet by mouth daily at 12 noon.   Yes [provider]  Ascorbic Acid (VITAMIN C) 100 MG tablet Take 100 mg by mouth daily.   Yes [provider]  Aspirin-Acetaminophen-Caffeine (EXCEDRIN PO) Take 1 tablet by mouth daily as needed.   Yes [provider]  Calcium 150 MG TABS Take 1 tablet by mouth daily.   Yes [provider]  Cholecalciferol (VITAMIN D3) 1.25 MG (50000 UT) CAPS Take 1 tablet by mouth daily.   Yes [provider]  doxylamine, Sleep, (UNISOM) 25 MG tablet Take 25 mg by mouth at bedtime as needed.   Yes [provider]  Misc Natural Products (TURMERIC CURCUMIN) CAPS Take 1 capsule by mouth daily.   Yes [provider]  Multiple Vitamins-Minerals (  CENTRUM ADULTS PO) Take 1 tablet by mouth daily.   Yes [provider]  naproxen sodium (ALEVE) 220 MG tablet Take 220 mg by mouth.   Yes [provider]  zinc gluconate 50 MG tablet Take 50 mg by mouth daily.   Yes [provider]  etodolac (LODINE) 400 MG tablet Take 1 tablet (400 mg total) by mouth 2 (two) times daily. With food Patient not taking: No sig reported 11/27/15   Merlyn Albert, MD     Critical care time: 42 minutes      Joneen Roach, AGACNP-BC [facility]  See Amion for personal pager PCCM on call pager [phone]  06/16/2020 9:46 PM

## 2020-06-16 NOTE — ED Notes (Signed)
+  ICU Breakfast Ordered 

## 2020-06-16 NOTE — ED Provider Notes (Signed)
Patient transferred from [facility].  Subarachnoid bleed with aneurysm.  Subacute presentation.  Per [facility], patient stable in route.  Blood pressure 154/83.  Reports some dizziness.  No focal deficits.  Patient clinically stable.  Neurosurgery, Dr. Maurice Small advised of patient arrival to [facility].   Shon Baton, MD 06/16/20 [id number]

## 2020-06-16 NOTE — Progress Notes (Signed)
Patient arrived to unit with belongings at bedside: phone, charger, glasses, clothing, shoes, backpack including wallet and checkbook.  Phone, charger and glasses to stay at bedside per patient; rest of belongings to be sent home with daughter.

## 2020-06-16 NOTE — Transfer of Care (Signed)
Immediate Anesthesia Transfer of Care Note  Patient: Denise Savage  Procedure(s) Performed: IR WITH ANESTHESIA (N/A )  Patient Location: ICU  Anesthesia Type:General  Level of Consciousness: Patient remains intubated per anesthesia plan  Airway & Oxygen Therapy: Patient remains intubated per anesthesia plan and Patient placed on Ventilator (see vital sign flow sheet for setting)  Post-op Assessment: Report given to RN and Post -op Vital signs reviewed and stable  Post vital signs: Reviewed and stable  Last Vitals:  Vitals Value Taken Time  BP 142/68 06/16/20 2031  Temp    Pulse 70 06/16/20 2039  Resp 16 06/16/20 2039  SpO2 100 % 06/16/20 2039  Vitals shown include unvalidated device data.  Last Pain:  Vitals:   06/16/20 1600  TempSrc: Oral  PainSc:          Complications: No complications documented.

## 2020-06-16 NOTE — ED Provider Notes (Signed)
Nurse was asking me about patient's admission status.  When we called bed control they state she would not be getting a bed tonight.  I will talk to [facility], ED about transfer.  Patient was started on nimodipine and her blood pressure at 1:00 AM is 137/86 down from 182/89 when she arrived in the ED.  01:38 AM Dr Wilkie Aye, [facility] ED physician accepts in transfer to [facility] ED.  [facility] has been called and will transport patient to [facility], ED.   Devoria Albe, MD 06/16/20 [phone]

## 2020-06-16 NOTE — Brief Op Note (Signed)
  NEUROSURGERY BRIEF OPERATIVE  NOTE   PREOP DX: Subarachnoid Hemorrhage  POSTOP DX: Same  PROCEDURE: Diagnostic cerebral angiogram, coil embolization of LMCA aneurysm  SURGEON: Dr. Lisbeth Renshaw, MD  ANESTHESIA: GETA  EBL: Minimal  SPECIMENS: None  COMPLICATIONS: Intraoperative aneurysm rupture   CONDITION: Hemodynamically stable to neuro ICU  FINDINGS (Full report in [facility]): 1. Irregular 7.3mm LMCA aneurysm coiled with intraoperative rupture and subsequent severe vasospasm of superior division LMCA and occlusion of inferior division LMCA 2. ~69mm smooth supraclinoid ICA aneurysm   Lisbeth Renshaw, MD [facility]

## 2020-06-16 NOTE — Anesthesia Procedure Notes (Signed)
Procedure Name: Intubation Performed by: Makell Drohan, CRNA Pre-anesthesia Checklist: Patient identified, Emergency Drugs available, Suction available and Patient being monitored Patient Re-evaluated:Patient Re-evaluated prior to induction Oxygen Delivery Method: Circle system utilized Preoxygenation: Pre-oxygenation with 100% oxygen Induction Type: IV induction Ventilation: Mask ventilation without difficulty Laryngoscope Size: Mac and 3 Grade View: Grade I Tube type: Oral Tube size: 7.5 mm Number of attempts: 1 Airway Equipment and Method: Stylet and Oral airway Placement Confirmation: ETT inserted through vocal cords under direct vision,  positive ETCO2 and breath sounds checked- equal and bilateral Secured at: 22 cm Tube secured with: Tape Dental Injury: Teeth and Oropharynx as per pre-operative assessment        

## 2020-06-16 NOTE — Anesthesia Preprocedure Evaluation (Signed)
Anesthesia Evaluation  Patient identified by MRN, date of birth, ID band Patient awake    Reviewed: Allergy & Precautions, NPO status , Patient's Chart, lab work & pertinent test results  Airway Mallampati: II  TM Distance: >3 FB Neck ROM: Full    Dental no notable dental hx. (+) Dental Advisory Given, Teeth Intact   Pulmonary neg pulmonary ROS,    Pulmonary exam normal breath sounds clear to auscultation       Cardiovascular Exercise Tolerance: Good negative cardio ROS Normal cardiovascular exam Rhythm:Regular Rate:Normal     Neuro/Psych negative neurological ROS  negative psych ROS   GI/Hepatic negative GI ROS, Neg liver ROS,   Endo/Other  negative endocrine ROS  Renal/GU negative Renal ROSK+ 3.5 Cr 0.80     Musculoskeletal negative musculoskeletal ROS (+)   Abdominal (+) + obese,   Peds  Hematology negative hematology ROS (+) hgb 13.3 plt 248   Anesthesia Other Findings All: hydrocodone  Reproductive/Obstetrics                             Anesthesia Physical Anesthesia Plan  ASA: III and emergent  Anesthesia Plan: General   Post-op Pain Management:    Induction: Intravenous  PONV Risk Score and Plan: 4 or greater and Treatment may vary due to age or medical condition, Ondansetron, Dexamethasone and Midazolam  Airway Management Planned: Oral ETT  Additional Equipment: Arterial line  Intra-op Plan:   Post-operative Plan: Extubation in OR  Informed Consent: I have reviewed the patients History and Physical, chart, labs and discussed the procedure including the risks, benefits and alternatives for the proposed anesthesia with the patient or authorized representative who has indicated his/her understanding and acceptance.     Dental advisory given  Plan Discussed with: CRNA and Anesthesiologist  Anesthesia Plan Comments:         Anesthesia Quick Evaluation

## 2020-06-16 NOTE — Progress Notes (Signed)
  NEUROSURGERY PROGRESS NOTE   No issues overnight. History reviewed with pt, in EMR and with Dr. Maurice Small. Had sudden onset of HA on new years eve, 5 days ago. Initially improved with tylenol, alleve. No improvement with OTC meds yesterday therefore came to ED. No associated visual changes, speech difficulty, N/T/W.  No significant PMHx Non-smoker No FHx of aneurysms or intracranial hemorrhage  EXAM:  BP (!) 143/78   Pulse 68   Temp 98.7 F (37.1 C)   Resp 18   Ht 5\' 4"  (1.626 m)   Wt 100.7 kg   SpO2 97%   BMI 38.11 kg/m   Awake, alert, oriented  Speech fluent, appropriate  CN grossly intact  5/5 BUE/BLE   IMAGING: CTA demonstrates relatively focal left Sylvian SAH, no basal SAH or IVH. No HCP. CTA demonstrates irregular appearing LMCA bifurcation aneurysm  IMPRESSION:  60 y.o. female SAH, Hunt-Hess 1 Fisher 2 likely from LMCA aneurysm  PLAN: - Will proceed with diagnostic angiogram, appropriate treatment of LMCA aneurysm (coil vs clip ligation)  I have reviewed the imaging findings with the patient and her daughter. We discussed the plan above including treatment options for the aneurysm. Risks of each procedure were reviewed to include primarily stroke leading to weakness/paralysis/coma/death, arterial injury, contrast reaction, groin hematoma, and risks of surgery also including infection, SZ, and hydrocephalus. All their questions this am were answered and the patient provided consent to proceed as above.  67, MD [facility]

## 2020-06-16 NOTE — ED Notes (Signed)
Pt ambulated to EMS stretcher without difficulty. All transfer paperwork given to [facility]. All questions answered at this time and pt appears to be in NAD. Pt transported off unit without difficulty.

## 2020-06-17 ENCOUNTER — Encounter (HOSPITAL_COMMUNITY): Payer: Self-pay | Admitting: Neurosurgery

## 2020-06-17 ENCOUNTER — Inpatient Hospital Stay (HOSPITAL_COMMUNITY): Payer: Medicaid Other

## 2020-06-17 DIAGNOSIS — E876 Hypokalemia: Secondary | ICD-10-CM

## 2020-06-17 DIAGNOSIS — I609 Nontraumatic subarachnoid hemorrhage, unspecified: Secondary | ICD-10-CM

## 2020-06-17 DIAGNOSIS — J9601 Acute respiratory failure with hypoxia: Secondary | ICD-10-CM

## 2020-06-17 LAB — PHOSPHORUS
Phosphorus: 2.5 mg/dL (ref 2.5–4.6)
Phosphorus: 2.1 mg/dL — ABNORMAL LOW (ref 2.5–4.6)

## 2020-06-17 LAB — GLUCOSE, CAPILLARY
Glucose-Capillary: 114 mg/dL — ABNORMAL HIGH (ref 70–99)
Glucose-Capillary: 126 mg/dL — ABNORMAL HIGH (ref 70–99)
Glucose-Capillary: 133 mg/dL — ABNORMAL HIGH (ref 70–99)
Glucose-Capillary: 135 mg/dL — ABNORMAL HIGH (ref 70–99)
Glucose-Capillary: 158 mg/dL — ABNORMAL HIGH (ref 70–99)
Glucose-Capillary: 169 mg/dL — ABNORMAL HIGH (ref 70–99)

## 2020-06-17 LAB — BASIC METABOLIC PANEL
Anion gap: 10 (ref 5–15)
BUN: 13 mg/dL (ref 6–20)
CO2: 23 mmol/L (ref 22–32)
Calcium: 10.3 mg/dL (ref 8.9–10.3)
Chloride: 102 mmol/L (ref 98–111)
Creatinine, Ser: 0.75 mg/dL (ref 0.44–1.00)
GFR, Estimated: >60 mL/min (ref >60)
Glucose, Bld: 160 mg/dL — ABNORMAL HIGH (ref 70–99)
Potassium: 3.6 mmol/L (ref 3.5–5.1)
Sodium: 135 mmol/L (ref 135–145)

## 2020-06-17 LAB — POCT I-STAT 7, (LYTES, BLD GAS, ICA,H+H)
Acid-base deficit: 1 mmol/L (ref 0.0–2.0)
Bicarbonate: 22.8 mmol/L (ref 20.0–28.0)
Calcium, Ion: 1.47 mmol/L — ABNORMAL HIGH (ref 1.15–1.40)
HCT: 40 % (ref 36.0–46.0)
Hemoglobin: 13.6 g/dL (ref 12.0–15.0)
O2 Saturation: 97 %
Patient temperature: 98.6
Potassium: 3.4 mmol/L — ABNORMAL LOW (ref 3.5–5.1)
Sodium: 137 mmol/L (ref 135–145)
TCO2: 24 mmol/L (ref 22–32)
pCO2 arterial: 35.4 mmHg (ref 32.0–48.0)
pH, Arterial: 7.416 (ref 7.350–7.450)
pO2, Arterial: 85 mmHg (ref 83.0–108.0)

## 2020-06-17 LAB — CBC
HCT: 39.2 % (ref 36.0–46.0)
Hemoglobin: 12.6 g/dL (ref 12.0–15.0)
MCH: 27.8 pg (ref 26.0–34.0)
MCHC: 32.1 g/dL (ref 30.0–36.0)
MCV: 86.5 fL (ref 80.0–100.0)
Platelets: 224 10*3/uL (ref 150–400)
RBC: 4.53 MIL/uL (ref 3.87–5.11)
RDW: 13.8 % (ref 11.5–15.5)
WBC: 10.7 10*3/uL — ABNORMAL HIGH (ref 4.0–10.5)
nRBC: 0 % (ref 0.0–0.2)

## 2020-06-17 LAB — SODIUM
Sodium: 137 mmol/L (ref 135–145)
Sodium: 139 mmol/L (ref 135–145)
Sodium: 140 mmol/L (ref 135–145)

## 2020-06-17 LAB — TRIGLYCERIDES: Triglycerides: 77 mg/dL (ref <150)

## 2020-06-17 LAB — HEMOGLOBIN A1C
Hgb A1c MFr Bld: 5.6 % (ref 4.8–5.6)
Mean Plasma Glucose: 114.02 mg/dL

## 2020-06-17 LAB — MAGNESIUM
Magnesium: 1.6 mg/dL — ABNORMAL LOW (ref 1.7–2.4)
Magnesium: 2.2 mg/dL (ref 1.7–2.4)

## 2020-06-17 MED ORDER — VITAL HIGH PROTEIN PO LIQD
1000.0000 mL | ORAL | Status: DC
  Administered 2020-06-17 – 2020-06-23 (×5): 1000 mL
  Filled 2020-06-17 (×4): qty 1000

## 2020-06-17 MED ORDER — LORAZEPAM 2 MG/ML IJ SOLN
INTRAMUSCULAR | Status: AC
  Filled 2020-06-17: qty 1

## 2020-06-17 MED ORDER — MAGNESIUM SULFATE 2 GM/50ML IV SOLN: 2.0000 g | Freq: Once | INTRAVENOUS | Status: DC

## 2020-06-17 MED ORDER — MAGNESIUM SULFATE 4 GM/100ML IV SOLN
4.0000 g | Freq: Once | INTRAVENOUS | Status: AC
  Administered 2020-06-17: 4 g via INTRAVENOUS
  Filled 2020-06-17: qty 100

## 2020-06-17 MED ORDER — FENTANYL CITRATE (PF) 100 MCG/2ML IJ SOLN
50.0000 ug | INTRAMUSCULAR | Status: DC | PRN
  Administered 2020-06-22 – 2020-07-02 (×6): 100 ug via INTRAVENOUS
  Filled 2020-06-17 (×5): qty 2

## 2020-06-17 MED ORDER — LEVETIRACETAM IN NACL 500 MG/100ML IV SOLN
500.0000 mg | Freq: Two times a day (BID) | INTRAVENOUS | Status: DC
  Administered 2020-06-17 – 2020-06-18 (×4): 500 mg via INTRAVENOUS
  Filled 2020-06-17 (×4): qty 100

## 2020-06-17 MED ORDER — DEXMEDETOMIDINE HCL IN NACL 400 MCG/100ML IV SOLN: 0.2000 ug/kg/h | INTRAVENOUS | Status: DC

## 2020-06-17 MED ORDER — SODIUM CHLORIDE 3 % IV SOLN
INTRAVENOUS | Status: DC
  Filled 2020-06-17 (×6): qty 500

## 2020-06-17 MED ORDER — LORAZEPAM 2 MG/ML IJ SOLN: 2.0000 mg | Freq: Once | INTRAMUSCULAR | Status: DC

## 2020-06-17 NOTE — Procedures (Signed)
Cortrak  Person Inserting Tube:  Savage, Jahking Lesser E, RD Tube Type:  Cortrak - 43 inches Tube Location:  Right nare Initial Placement:  Stomach Secured by: Bridle Technique Used to Measure Tube Placement:  Documented cm marking at nare/ corner of mouth Cortrak Secured At:  70 cm    Cortrak Tube Team Note:  Consult received to place a Cortrak feeding tube.   No x-ray is required. RN may begin using tube.   If the tube becomes dislodged please keep the tube and contact the Cortrak team at [email or web address] ([email or web address]) for replacement.  If after hours and replacement cannot be delayed, place a NG tube and confirm placement with an abdominal x-ray.   Denise Stepp King, MS, RD, LDN Pager number available on Amion 

## 2020-06-17 NOTE — Procedures (Addendum)
Patient Name: Denise Savage  MRN: [id number]  Epilepsy Attending: Charlsie Quest  Referring Physician/Provider: Selmer Dominion, NP Date: 06/17/2020 Duration: 24.24 mins  Patient history: 60 year old female with ruptured left MCA aneurysm status post coiling, continues to be encephalopathic. EEG to evaluate for seizures.  Level of alertness:  comatose  AEDs during EEG study: Keppra  Technical aspects: This EEG study was done with scalp electrodes positioned according to the 10-20 International system of electrode placement. Electrical activity was acquired at a sampling rate of 500Hz  and reviewed with a high frequency filter of 70Hz  and a low frequency filter of 1Hz . EEG data were recorded continuously and digitally stored.   Description: EEG showed continuous generalized 3 to 5 Hz low amplitude theta-delta slowing in left hemisphere there is 6-9 Hz theta and alpha activity in her right hemisphere as well as intermittent 2 to 3 Hz delta slowing.  Hyperventilation and photic stimulation were not performed.     ABNORMALITY -Continuous slow, generalized and lateralized left hemisphere  IMPRESSION: This study is suggestive of cortical dysfunction in left hemisphere likely secondary to underlying structural abnormality. Additionally there is moderate to severe diffuse encephalopathy, nonspecific etiology. No seizures or epileptiform discharges were seen throughout the recording.  Jacquelin Krajewski [name]

## 2020-06-17 NOTE — Progress Notes (Signed)
At 1700 Denise Savage no longer has blood return.  Removed per protocol.

## 2020-06-17 NOTE — Progress Notes (Signed)
LTM EEG hooked up and running - no initial skin breakdown - push button tested - neuro notified.  

## 2020-06-17 NOTE — Progress Notes (Addendum)
Neurosurgery Service Progress Note  Subjective: No acute events overnight   Objective: Vitals:   06/17/20 0600 06/17/20 0700 06/17/20 0759 06/17/20 0800  BP: 117/62 (!) 111/55 (!) 144/55   Pulse: (!) 51 (!) 51 (!) 58   Resp: 16  (!) 22   Temp:    98.1 F (36.7 C)  TempSrc:    Axillary  SpO2: 97% 97% 99%   Weight:      Height:        Physical Exam: Eyes open to voice, +L gaze preference, PERRL, moving L side spontaneously, 0/5 on R  Assessment & Plan: 60 y.o. woman p/w ruptured L MCA s/p coiling w/ intra-op rupture.   -Pt's daughter informed me today that the patient had headaches for roughly 4-5 days prior to admission, likely explains her intra-op propensity for vasospasm -post-op CTH shows expected increase in SAH from rupture and new hypodensities in L MCA distribution -recommend starting 3% NaCl with goal of 150-155 as her L MCA infarcts will likely mature and swell over the next few days -qd CTH x3d to follow midline shift -CCM recs, cor trak -SQH tomorrow -updated pt's daughter at bedside this morning -cont nimodipine, SBP goal permissive HTN up to  Denise Savage A Denise Savage  06/17/20 9:23 AM

## 2020-06-17 NOTE — Progress Notes (Signed)
EEG complete - results pending 

## 2020-06-17 NOTE — Progress Notes (Signed)
  NEUROSURGERY PROGRESS NOTE   Received call from nursing that patient had worsening mental status - drowsiness. Stat CT ordered. Came by to evaluate the patient. By the time I arrived, patient is at baseline with eyes open, left gaze preference, purposeful with LUE/LLE. No movement RUE/RLE. CT unchanged from this am, no evidence of hydrocephalus. ?seizure although spot EEG negative. Will add LTM EEG and monitor.

## 2020-06-17 NOTE — Progress Notes (Signed)
Initial Nutrition Assessment  DOCUMENTATION CODES:   Obesity unspecified  INTERVENTION:   Initiate tube feeding via Cortrak tube: Vital High Protein at 60 ml/h (1440 ml per day)  Provides 1440 kcal, 126 gm protein, 1203 ml free water daily   NUTRITION DIAGNOSIS:   Inadequate oral intake related to inability to eat as evidenced by NPO status.  GOAL:   Patient will meet greater than or equal to 90% of their needs  MONITOR:   TF tolerance  REASON FOR ASSESSMENT:   Ventilator    ASSESSMENT:   Pt with PMH of abdominal hernia admitted with L MCA aneurysm s/p coiling complicated by intraoperative rupture and subsequent severe vasospasm.   Per MD failed SBT will attempt when more awake. Ok to start TF today.   Patient is currently intubated on ventilator support MV: 9.3 L/min Temp (24hrs), Avg:98.2 F (36.8 C), Min:97.9 F (36.6 C), Max:99 F (37.2 C)  Propofol: 12 ml/hr provides: 316 kcal  Medications reviewed and include: colace, SSI Cleviprex @ 20 ml/hr provides: 960 kcal  Keppra Mag sulfate x 1  Labs reviewed: Magnesium 1.6 CBG's: 114-169 (NPO)    Diet Order:   Diet Order            Diet NPO time specified  Diet effective now                 EDUCATION NEEDS:   No education needs have been identified at this time  Skin:  Skin Assessment: Reviewed RN Assessment  Last BM:  unknown  Height:   Ht Readings from Last 1 Encounters:  06/15/20 5\' 4"  (1.626 m)    Weight:   Wt Readings from Last 1 Encounters:  06/15/20 100.7 kg    Ideal Body Weight:  54.4 kg  BMI:  Body mass index is 38.11 kg/m.  Estimated Nutritional Needs:   Kcal:  1400  Protein:  110-135 grams  Fluid:  >1.5 L/day  08/13/20., RD, LDN, CNSC See AMiON for contact information

## 2020-06-17 NOTE — Progress Notes (Signed)
Patient was taken to CT & back to 4N19 without any complications.

## 2020-06-17 NOTE — Progress Notes (Signed)
NAME:  Denise Savage, MRN:  [id number], DOB:  1960/12/16, LOS: 1 ADMISSION DATE:  06/15/2020, CONSULTATION DATE:  06/16/20 REFERRING MD:  Dr. Maurice Small, CHIEF COMPLAINT:  VDRF   Brief History:  60 year old female presented headache x 5 days found to have left sylvian fissure subarachnoid hemorrhage with a left MCA bifurcation aneurysm.  Transferred to [facility] for further care and went for diagnostic angiography with coiling, complicated by intraoperative rupture and subsequent severe vasospasm.  She returned to the ICU intubated, deeply sedated with propofol and on Cleviprex for blood pressure control.   History of Present Illness:  Patient is encephalopathic and/or intubated. Therefore history has been obtained from chart review.   60 year old female with no significant past medical history, on no daily medications, who presented to [facility] emergency department on 1/3 with headache x4 days which progressed to wake her from sleep with worst headache of life.  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  Neurosurgery was consulted and recommended transfer to [facility].  Shortly after arrival to [facility] she was taken for diagnostic angiogram and coiling. Operative course complicated by intraoperative rupture and subsequent severe vasospasm.  Postoperatively she remained on the mechanical ventilator was transferred to 4 N. ICU.  PCCM was consulted for ventilator and medical management.  Past Medical History:   has a past medical history of Abdominal hernia.  Significant Hospital Events:  1/3 present to [facility] emergency department 1/4 transferred to [facility], NSGY admitting/ taken to neuro IR for coiling.  Remained on vent postop  Consults:  Neuro IR PCCM  Procedures:  1/4 diagnostic angiogram with coiling  Significant Diagnostic Tests:  1/3 CT  angiogram head neck > small volume acute subarachnoid hemorrhage centered at the base of the left sylvian fissure.  6 x 6 x 9 mm irregular aneurysm arising from the left MCA bifurcation.  1/5 CTH >> 1. Diffused and partially reabsorbed subarachnoid hemorrhage since yesterday. 2. 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  Micro Data:  1/3 SARS/ flu >>neg 1/4 MRSA  >> neg  Antimicrobials:   n/a  Interim History / Subjective:  No events overnight Remains on cleviprex 10 mg Reaching for ETT in LUE this am CTH overnight shows stable/ partially reabsorbed SAH and LMCA territory infarction sparing basal ganglia   Objective   Blood pressure (!) 144/55, pulse (!) 58, temperature 98.1 F (36.7 C), temperature source Axillary, resp. rate (!) 22, height 5\' 4"  (1.626 m), weight 100.7 kg, SpO2 99 %.    Vent Mode: CPAP;PSV FiO2 (%):  [40 %-100 %] 40 % Set Rate:  [16 bmp] 16 bmp Vt Set:  [430 mL] 430 mL PEEP:  [5 cmH20] 5 cmH20 Pressure Support:  [10 cmH20] 10 cmH20 Plateau Pressure:  [11 cmH20-12 cmH20] 11 cmH20   Intake/Output Summary (Last 24 hours) at 06/17/2020 0817 Last data filed at 06/17/2020 0700 Gross per 24 hour  Intake 1222.88 ml  Output 950 ml  Net 272.88 ml   Filed Weights   06/15/20 1829  Weight: 100.7 kg    Examination: General: Older adult female intubated in NAD   HEENT: MM pink/moist, left gaze, pupils 4/reactive, does not cross midline, anicteric, ETT/ OGT  Neuro: Opens eyes to verbal, reaches for ETT with LUE, spont moves LLE, right hemiplegia, not f/c CV: rr, NSR, no murmur PULM:  Non labored, MV supported breaths, CTA GI: obese,  soft, bs+, foley   Extremities: warm/dry, no LE edema, some rthymic movements of LUE- remains with purposeful movement  Skin: no rashes   Afebrile  Labs and images reviewed. CXR 1/4 - LLL atelectasis, possible developing infiltrate vs effusion   Resolved Hospital Problem list     Assessment & Plan:    Subarachnoid hemorrhage at the left sylvian fissure secondary to left MCA bifurcation aneurysm.  Status post coiling 1/4 complicated by intraoperative aneurysmal rupture and subsequent severe vasospasm of superior division LMCA and occlusion of inferior division LMCA LMCA territory stroke  P:  Per NSGY Cleviprex for SBP goal < PAD protocol Adding keppra 500 mg BID x 7 days for seizure ppx (LUE muscle twitching likely muscle spasm, continues with purposeful movement, left gaze remains unchanged) Check iCa, K 3.6 s/p replete this am  Mag 1.6, will replete with 2gm and recheck in am  Spot EEG Seizure precautions  Continue nimodipine  Further imaging per NSGY  Trend CBC/ renal panel   Acute hypoxemic respiratory failure secondary to above Full MV support- PRVC SBT/ WUA today VAP / PPI Intermittent CXR- currently no clinical signs of infection   Best practice (evaluated daily)  Diet: NPO, start TF if not extubated today  Pain/Anxiety/Delirium protocol (if indicated): Propofol, as needed fentanyl, RASS goal as above VAP protocol (if indicated): Per protocol DVT prophylaxis: SCDs only  GI prophylaxis: PPI Glucose control: trend, add SSI if glucose > 180 Mobility: Bedrest Disposition: ICU  Goals of Care:  Last date of multidisciplinary goals of care discussion: Family and staff present:  Summary of discussion:  Follow up goals of care discussion due: 1/11 Code Status: Full   Daughter updated at bedside 1/5  Labs   CBC: Recent Labs  Lab 06/15/20 1959 06/16/20 1420 06/16/20 2148 06/16/20 2213 06/17/20 0316  WBC  --  7.9  --  9.2 10.7*  HGB 14.3 13.3 12.9 12.8 12.6  HCT 42.0 40.4 38.0 37.7 39.2  MCV  --  86.5  --  85.9 86.5  PLT  --  248  --  232 224    Basic Metabolic Panel: Recent Labs  Lab 06/15/20 1959 06/16/20 1420 06/16/20 2148 06/16/20 2213 06/17/20 0316  NA 139 135 139 135 135  K 3.6 3.5 3.4* 3.4* 3.6  CL 103 100  --  104 102  CO2  --  26   --  23 23  GLUCOSE 90 101*  --  156* 160*  BUN 17 10  --  11 13  CREATININE 0.90 0.80  --  0.81 0.75  CALCIUM  --  10.8*  --  10.1 10.3   GFR: Estimated Creatinine Clearance: 86.3 mL/min (by C-G formula based on SCr of 0.75 mg/dL). Recent Labs  Lab 06/16/20 1420 06/16/20 2213 06/17/20 0316  WBC 7.9 9.2 10.7*    Liver Function Tests: No results for input(s): AST, ALT, ALKPHOS, BILITOT, PROT, ALBUMIN in the last 168 hours. No results for input(s): LIPASE, AMYLASE in the last 168 hours. No results for input(s): AMMONIA in the last 168 hours.  ABG    Component Value Date/Time   PHART 7.396 06/16/2020 2148   PCO2ART 42.2 06/16/2020 2148   PO2ART 203 (H) 06/16/2020 2148   HCO3 25.8 06/16/2020 2148   TCO2 27 06/16/2020 2148   O2SAT 100.0 06/16/2020 2148     Coagulation Profile: Recent Labs  Lab 06/16/20 1420  INR 1.1    Cardiac Enzymes: No results for input(s): CKTOTAL, CKMB, CKMBINDEX, TROPONINI  in the last 168 hours.  HbA1C: Hgb A1c MFr Bld  Date/Time Value Ref Range Status  06/17/2020 03:16 AM 5.6 4.8 - 5.6 % Final    Comment:    (NOTE) Pre diabetes:          5.7%-6.4%  Diabetes:              >6.4%  Glycemic control for   <7.0% adults with diabetes     CBG: Recent Labs  Lab 06/16/20 2348 06/17/20 0318 06/17/20 0721  GLUCAP 143* 169* 114*    Critical care time: 35 minutes     Kennieth Rad, ACNP [facility] 06/17/2020, 8:17 AM

## 2020-06-17 NOTE — Progress Notes (Addendum)
Seizure-like activity suspected by CCM NP. Ativan pulled. MD called to bedside to assess patient. MD said it was not seizure-like activity. Ativan not given. Ativan wasted with Pieter Partridge, RN.

## 2020-06-17 NOTE — Progress Notes (Signed)
Assisted tele visit to patient with daughter.  Minette Manders M Osias Resnick, RN   

## 2020-06-17 NOTE — Progress Notes (Signed)
AT the 11:30 neuro assessment, pt less responsive. Pt barely moving left side to pain. Dr. Maurice Small notified. Stat head CT ordered.

## 2020-06-18 ENCOUNTER — Inpatient Hospital Stay: Payer: Self-pay

## 2020-06-18 ENCOUNTER — Inpatient Hospital Stay (HOSPITAL_COMMUNITY): Payer: Medicaid Other

## 2020-06-18 DIAGNOSIS — I63512 Cerebral infarction due to unspecified occlusion or stenosis of left middle cerebral artery: Secondary | ICD-10-CM

## 2020-06-18 DIAGNOSIS — E878 Other disorders of electrolyte and fluid balance, not elsewhere classified: Secondary | ICD-10-CM

## 2020-06-18 DIAGNOSIS — I619 Nontraumatic intracerebral hemorrhage, unspecified: Secondary | ICD-10-CM | POA: Diagnosis present

## 2020-06-18 DIAGNOSIS — R569 Unspecified convulsions: Secondary | ICD-10-CM

## 2020-06-18 LAB — CBC WITH DIFFERENTIAL/PLATELET
Abs Immature Granulocytes: 0.13 10*3/uL — ABNORMAL HIGH (ref 0.00–0.07)
Basophils Absolute: 0 10*3/uL (ref 0.0–0.1)
Basophils Relative: 0 %
Eosinophils Absolute: 0 10*3/uL (ref 0.0–0.5)
Eosinophils Relative: 0 %
HCT: 38.3 % (ref 36.0–46.0)
Hemoglobin: 12.6 g/dL (ref 12.0–15.0)
Immature Granulocytes: 1 %
Lymphocytes Relative: 4 %
Lymphs Abs: 0.6 10*3/uL — ABNORMAL LOW (ref 0.7–4.0)
MCH: 28.4 pg (ref 26.0–34.0)
MCHC: 32.9 g/dL (ref 30.0–36.0)
MCV: 86.5 fL (ref 80.0–100.0)
Monocytes Absolute: 1.3 10*3/uL — ABNORMAL HIGH (ref 0.1–1.0)
Monocytes Relative: 7 %
Neutro Abs: 16.1 10*3/uL — ABNORMAL HIGH (ref 1.7–7.7)
Neutrophils Relative %: 88 %
Platelets: 247 10*3/uL (ref 150–400)
RBC: 4.43 MIL/uL (ref 3.87–5.11)
RDW: 14.3 % (ref 11.5–15.5)
WBC: 18.1 10*3/uL — ABNORMAL HIGH (ref 4.0–10.5)
nRBC: 0 % (ref 0.0–0.2)

## 2020-06-18 LAB — RENAL FUNCTION PANEL
Albumin: 3.1 g/dL — ABNORMAL LOW (ref 3.5–5.0)
Anion gap: 9 (ref 5–15)
BUN: 18 mg/dL (ref 6–20)
CO2: 24 mmol/L (ref 22–32)
Calcium: 10.8 mg/dL — ABNORMAL HIGH (ref 8.9–10.3)
Chloride: 111 mmol/L (ref 98–111)
Creatinine, Ser: 0.73 mg/dL (ref 0.44–1.00)
GFR, Estimated: >60 mL/min (ref >60)
Glucose, Bld: 141 mg/dL — ABNORMAL HIGH (ref 70–99)
Phosphorus: 1.3 mg/dL — ABNORMAL LOW (ref 2.5–4.6)
Potassium: 3.5 mmol/L (ref 3.5–5.1)
Sodium: 144 mmol/L (ref 135–145)

## 2020-06-18 LAB — MAGNESIUM
Magnesium: 2.2 mg/dL (ref 1.7–2.4)
Magnesium: 1.9 mg/dL (ref 1.7–2.4)

## 2020-06-18 LAB — GLUCOSE, CAPILLARY
Glucose-Capillary: 131 mg/dL — ABNORMAL HIGH (ref 70–99)
Glucose-Capillary: 145 mg/dL — ABNORMAL HIGH (ref 70–99)
Glucose-Capillary: 134 mg/dL — ABNORMAL HIGH (ref 70–99)
Glucose-Capillary: 163 mg/dL — ABNORMAL HIGH (ref 70–99)
Glucose-Capillary: 126 mg/dL — ABNORMAL HIGH (ref 70–99)
Glucose-Capillary: 127 mg/dL — ABNORMAL HIGH (ref 70–99)

## 2020-06-18 LAB — SODIUM
Sodium: 149 mmol/L — ABNORMAL HIGH (ref 135–145)
Sodium: 145 mmol/L (ref 135–145)
Sodium: 145 mmol/L (ref 135–145)

## 2020-06-18 LAB — PHOSPHORUS: Phosphorus: 2 mg/dL — ABNORMAL LOW (ref 2.5–4.6)

## 2020-06-18 MED ORDER — DOCUSATE SODIUM 50 MG/5ML PO LIQD
100.0000 mg | Freq: Two times a day (BID) | ORAL | Status: DC
  Administered 2020-06-18 – 2020-06-22 (×5): 100 mg
  Filled 2020-06-18 (×5): qty 10

## 2020-06-18 MED ORDER — MIDAZOLAM HCL 2 MG/2ML IJ SOLN: 2.0000 mg | Freq: Once | INTRAMUSCULAR | Status: AC

## 2020-06-18 MED ORDER — MIDAZOLAM HCL 2 MG/2ML IJ SOLN
INTRAMUSCULAR | Status: AC
  Administered 2020-06-18: 2 mg
  Filled 2020-06-18: qty 2

## 2020-06-18 MED ORDER — PANTOPRAZOLE SODIUM 40 MG PO PACK
40.0000 mg | PACK | Freq: Every day | ORAL | Status: DC
  Administered 2020-06-19 – 2020-09-10 (×84): 40 mg
  Filled 2020-06-18 (×84): qty 20

## 2020-06-18 MED ORDER — POTASSIUM PHOSPHATES 15 MMOLE/5ML IV SOLN
30.0000 mmol | Freq: Once | INTRAVENOUS | Status: AC
  Administered 2020-06-18: 30 mmol via INTRAVENOUS
  Filled 2020-06-18: qty 10

## 2020-06-18 NOTE — Progress Notes (Signed)
Patient had an episode of emesis when being suctioned by RT.  RN turned of tube feed and was able to auscultate bowel sounds in all 4 quadrants.  Denise Savage at [facility]/CCM suggested to restart tube feeds at trickle feeds when we got back from scheduled CT scan.  Will continue to observe and act accordingly.

## 2020-06-18 NOTE — Anesthesia Postprocedure Evaluation (Signed)
Anesthesia Post Note  Patient: Denise Savage  Procedure(s) Performed: IR WITH ANESTHESIA (N/A )     Patient location during evaluation: SICU Anesthesia Type: General Level of consciousness: sedated and patient remains intubated per anesthesia plan Pain management: pain level controlled Vital Signs Assessment: post-procedure vital signs reviewed and stable Respiratory status: patient remains intubated per anesthesia plan Cardiovascular status: stable Anesthetic complications: no   No complications documented.  Last Vitals:  Vitals:   06/18/20 0500 06/18/20 0600  BP: (!) 155/82 (!) 155/62  Pulse: (!) 58 (!) 53  Resp: (!) 21 (!) 21  Temp:    SpO2: 100% 98%    Last Pain:  Vitals:   06/18/20 0400  TempSrc: Axillary  PainSc:                  Lewie Loron

## 2020-06-18 NOTE — Progress Notes (Signed)
LTM maintenance completed; checked and reprepped under Fp1, Fp2, and A1. No skin breakdown was seen.

## 2020-06-18 NOTE — Progress Notes (Signed)
NAME:  Denise Savage, MRN:  [id number], DOB:  Oct 27, 1960, LOS: 2 ADMISSION DATE:  06/15/2020, CONSULTATION DATE:  06/16/20 REFERRING MD:  Dr. Maurice Small, CHIEF COMPLAINT:  VDRF   Brief History:  60 year old female presented headache x 5 days found to have left sylvian fissure subarachnoid hemorrhage with a left MCA bifurcation aneurysm.  Transferred to [facility] for further care and went for diagnostic angiography with coiling, complicated by intraoperative rupture and subsequent severe vasospasm leading to malignant left MCA stroke.  She returned to the ICU intubated.  Past Medical History:   has a past medical history of Abdominal hernia.  Significant Hospital Events:  1/3 present to [facility] emergency department 1/4 transferred to [facility], NSGY admitting/ taken to neuro IR for coiling.  Remained on vent postop  Consults:  Neuro IR PCCM  Procedures:  1/4 diagnostic angiogram with coiling  Significant Diagnostic Tests:  1/3 CT angiogram head neck > small volume acute subarachnoid hemorrhage centered at the base of the left sylvian fissure.  6 x 6 x 9 mm irregular aneurysm arising from the left MCA bifurcation.  1/5 CTH >> 1. Diffused and partially reabsorbed subarachnoid hemorrhage since yesterday. 2. 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  1/6 CTH: Progressive swelling of the left MCA infarct with new midline shift measuring 6 mm. Nonprogressive subarachnoid hemorrhage. No evidence of new territory infarct  Micro Data:  1/3 SARS/ flu >>neg 1/4 MRSA  >> neg  Antimicrobials:   n/a  Interim History / Subjective:  Overnight there was concern that patient is more lethargic and unresponsive, stat head CT was done which showed progressive left MCA infarct with brain edema and 6 mm midline shift.  Persistent nonprogressive subarachnoid hemorrhage. CT head was repeated this morning again to evaluate increasing brain swelling due to malignant left MCA stroke, looks  stable  Objective   Blood pressure (!) 156/70, pulse (!) 59, temperature 99.8 F (37.7 C), temperature source Axillary, resp. rate (!) 23, height 5\' 4"  (1.626 m), weight 100 kg, SpO2 99 %.    Vent Mode: PRVC FiO2 (%):  [40 %] 40 % Set Rate:  [16 bmp] 16 bmp Vt Set:  [430 mL] 430 mL PEEP:  [5 cmH20] 5 cmH20 Pressure Support:  [10 cmH20] 10 cmH20 Plateau Pressure:  [18 cmH20] 18 cmH20   Intake/Output Summary (Last 24 hours) at 06/18/2020 1431 Last data filed at 06/18/2020 1300 Gross per 24 hour  Intake 2645.86 ml  Output 1075 ml  Net 1570.86 ml   Filed Weights   06/15/20 1829 06/18/20 0500  Weight: 100.7 kg 100 kg    Examination: General: Middle-aged African-American female, lying on the bed, orally intubated HEENT: Atraumatic, normocephalic MM pink/moist, left gaze, pupils 4/reactive, does not cross midline, anicteric, ETT/ OGT  Neuro: Opens eyes to verbal, reaches for ETT with LUE, spont moves LLE, plegic on right side CV: Regular rate and rhythm, no murmur PULM: Clear to auscultation bilaterally, no wheezes GI: Obese, soft, nontender, nondistended bowel sounds present Extremities: warm/dry, no LE edema, some rthymic movements of LUE- remains with purposeful movement  Skin: no rashes   Resolved Hospital Problem list     Assessment & Plan:   Aneurysmal subarachnoid hemorrhage due to ruptured left MCA aneurysm s/p coiling 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  CT scan was repeated this morning suggestive of evolving left MCA stroke with brain swelling and midline shift, she is at high risk of requiring hemicraniectomy to improve chances of survival and morbidity Neurosurgery has been closely following Mental status  still poor but she opens eyes and intermittently follows some commands She tolerates pressure support trial but we will keep her intubated considering her increasing cerebral edema that may require surgical intervention Continue Keppra for seizure prophylaxis for 7 days unless patient started with seizure EEG was negative for active seizures Nutritionist follow-up Continue tube feeds Electrolytes are being supplemented aggressively, continue to monitor VAP / PPI Intermittent CXR- currently no clinical signs of infection   Best practice (evaluated daily)  Diet: NPO, tube feeds Pain/Anxiety/Delirium protocol (if indicated): As needed fentanyl with RASS goal 0/-1 VAP protocol (if indicated): Per protocol DVT prophylaxis: SCDs only  GI prophylaxis: PPI Glucose control: trend, with goal FS 140-180 Mobility: Bedrest Disposition: ICU  Goals of Care:  Last date of multidisciplinary goals of care discussion: 1/5 Family and staff present: Patient's daughter at the bedside Summary of discussion: Continue full aggressive care Follow up goals of care discussion due: 1/12 Code Status: Full   Labs   CBC: Recent Labs  Lab 06/16/20 1420 06/16/20 2148 06/16/20 2213 06/17/20 0316 06/17/20 0939 06/18/20 0633  WBC 7.9  --  9.2 10.7*  --  18.1*  NEUTROABS  --   --   --   --   --  16.1*  HGB 13.3 12.9 12.8 12.6 13.6 12.6  HCT 40.4 38.0 37.7 39.2 40.0 38.3  MCV 86.5  --  85.9 86.5  --  86.5  PLT 248  --  232 224  --  244    Basic Metabolic Panel: Recent Labs  Lab 06/15/20 1959 06/16/20 1420 06/16/20 2148 06/16/20 2213 06/17/20 0316 06/17/20 0820 06/17/20 0939 06/17/20 0954 06/17/20 1218 06/17/20 1528 06/17/20 2121 06/18/20 0633 06/18/20 1105  NA 139 135 139 135 135  --  137 137  --  139 140 144 149*  K 3.6 3.5 3.4* 3.4* 3.6  --  3.4*  --   --   --   --  3.5  --   CL 103 100  --  104 102  --   --   --   --   --   --  111  --   CO2  --  26  --  23 23  --   --   --   --   --   --   24  --   GLUCOSE 90 101*  --  156* 160*  --   --   --   --   --   --  141*  --   BUN 17 10  --  11 13  --   --   --   --   --   --  18  --   CREATININE 0.90 0.80  --  0.81 0.75  --   --   --   --   --   --  0.73  --   CALCIUM  --  10.8*  --  10.1 10.3  --   --   --   --   --   --  10.8*  --   MG  --   --   --   --   --  1.6*  --   --   --   --  2.2  2.2  --   PHOS  --   --   --   --   --   --   --   --  2.5  --  2.1* 1.3*  --    GFR: Estimated Creatinine Clearance: 85.9 mL/min (by C-G formula based on SCr of 0.73 mg/dL). Recent Labs  Lab 06/16/20 1420 06/16/20 2213 06/17/20 0316 06/18/20 0633  WBC 7.9 9.2 10.7* 18.1*    Liver Function Tests: Recent Labs  Lab 06/18/20 0633  ALBUMIN 3.1*   No results for input(s): LIPASE, AMYLASE in the last 168 hours. No results for input(s): AMMONIA in the last 168 hours.  ABG    Component Value Date/Time   PHART 7.416 06/17/2020 0939   PCO2ART 35.4 06/17/2020 0939   PO2ART 85 06/17/2020 0939   HCO3 22.8 06/17/2020 0939   TCO2 24 06/17/2020 0939   ACIDBASEDEF 1.0 06/17/2020 0939   O2SAT 97.0 06/17/2020 0939     Coagulation Profile: Recent Labs  Lab 06/16/20 1420  INR 1.1    Cardiac Enzymes: No results for input(s): CKTOTAL, CKMB, CKMBINDEX, TROPONINI in the last 168 hours.  HbA1C: Hgb A1c MFr Bld  Date/Time Value Ref Range Status  06/17/2020 03:16 AM 5.6 4.8 - 5.6 % Final    Comment:    (NOTE) Pre diabetes:          5.7%-6.4%  Diabetes:              >6.4%  Glycemic control for   <7.0% adults with diabetes     CBG: Recent Labs  Lab 06/17/20 1932 06/17/20 2331 06/18/20 0348 06/18/20 0809 06/18/20 1156  GLUCAP 135* 158* 131* 145* 134*    Total critical care time: 39 minutes  Performed by: Cheri Fowler   Critical care time was exclusive of separately billable procedures and treating other patients.   Critical care was necessary to treat or prevent imminent or life-threatening deterioration.   Critical  care was time spent personally by me on the following activities: development of treatment plan with patient and/or surrogate as well as nursing, discussions with consultants, evaluation of patient's response to treatment, examination of patient, obtaining history from patient or surrogate, ordering and performing treatments and interventions, ordering and review of laboratory studies, ordering and review of radiographic studies, pulse oximetry and re-evaluation of patient's condition.   Cheri Fowler MD [facility] Pager: [phone] Mobile: [phone]

## 2020-06-18 NOTE — Progress Notes (Signed)
Patient was transported to CT & back to 4N19 without any complications.  ?

## 2020-06-18 NOTE — Progress Notes (Signed)
  NEUROSURGERY PROGRESS NOTE   No issues overnight. LTM EEG applied yesterday evening  EXAM:  BP (!) 155/62   Pulse (!) 53   Temp 99 F (37.2 C) (Axillary)   Resp (!) 21   Ht 5\' 4"  (1.626 m)   Wt 100 kg   SpO2 98%   BMI 37.84 kg/m   Sleeping, easily awakened to voice Left gaze preference, pupils equal and reactive Moves LUE/LLE to pain No movement RUE/RLE  IMPRESSION/PLAN 60 y.o. female POD2 coiling ruptured LMCA complicated by intraop rupture, left MCA infarct.  Stable neurologically. Worsening edema on CT this am as expected. - continue supportive care, keppra, nimotop - continue 3%.  - appreciate CCM management

## 2020-06-18 NOTE — Procedures (Addendum)
Patient Name: Denise Savage  MRN: [id number]  Epilepsy Attending: Charlsie Quest  Referring Physician/Provider:  Dr. Cindra Presume Duration:  06/17/2020 1834 to  06/18/2020  1834  Patient history: 61 year old female with ruptured left MCA aneurysm status post coiling, continues to be encephalopathic. EEG to evaluate for seizures.  Level of alertness:  comatose  AEDs during EEG study: Keppra  Technical aspects: This EEG study was done with scalp electrodes positioned according to the 10-20 International system of electrode placement. Electrical activity was acquired at a sampling rate of 500Hz  and reviewed with a high frequency filter of 70Hz  and a low frequency filter of 1Hz . EEG data were recorded continuously and digitally stored.   Description: EEG showed continuous generalized 3 to 5 Hz low amplitude theta-delta slowing in left hemisphere. There is 6-9 Hz theta and alpha activity in right hemisphere as well as intermittent rhythmic 2 to 3 Hz delta slowing. Spikes were noted in right parietal region. Hyperventilation and photic stimulation were not performed.     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 There is also cortical dysfunction in left hemisphere likely secondary to underlying structural abnormality. The intermittent rhythmic delta slow is in the ictal-interictal continuum. Additionally there is moderate to severe diffuse encephalopathy, nonspecific etiology. No definite seizures were seen throughout the recording.  [name] 

## 2020-06-18 NOTE — Progress Notes (Signed)
Patient placed on 100% FI02 for CT trip.  Patient transported to CT and back with no events to report.

## 2020-06-18 NOTE — Progress Notes (Signed)
Assisted tele visit to patient with family member.  Elby Blackwelder D Cardell Rachel, RN   

## 2020-06-19 ENCOUNTER — Inpatient Hospital Stay (HOSPITAL_COMMUNITY): Payer: Medicaid Other | Admitting: Anesthesiology

## 2020-06-19 ENCOUNTER — Inpatient Hospital Stay (HOSPITAL_COMMUNITY): Payer: Medicaid Other

## 2020-06-19 ENCOUNTER — Encounter (HOSPITAL_COMMUNITY)
Admission: EM | Disposition: A | Discharge status: Skilled Nursing Facility | Payer: Self-pay | Source: Home / Self Care | Attending: Neurosurgery

## 2020-06-19 DIAGNOSIS — G9340 Encephalopathy, unspecified: Secondary | ICD-10-CM

## 2020-06-19 HISTORY — PX: CRANIOTOMY: SHX93

## 2020-06-19 LAB — GLUCOSE, CAPILLARY
Glucose-Capillary: 130 mg/dL — ABNORMAL HIGH (ref 70–99)
Glucose-Capillary: 132 mg/dL — ABNORMAL HIGH (ref 70–99)
Glucose-Capillary: 129 mg/dL — ABNORMAL HIGH (ref 70–99)
Glucose-Capillary: 121 mg/dL — ABNORMAL HIGH (ref 70–99)
Glucose-Capillary: 130 mg/dL — ABNORMAL HIGH (ref 70–99)
Glucose-Capillary: 113 mg/dL — ABNORMAL HIGH (ref 70–99)

## 2020-06-19 LAB — TYPE AND SCREEN
ABO/RH(D): B POS
Antibody Screen: NEGATIVE

## 2020-06-19 LAB — ABO/RH: ABO/RH(D): B POS

## 2020-06-19 LAB — SODIUM
Sodium: 147 mmol/L — ABNORMAL HIGH (ref 135–145)
Sodium: 148 mmol/L — ABNORMAL HIGH (ref 135–145)

## 2020-06-19 LAB — MAGNESIUM: Magnesium: 1.8 mg/dL (ref 1.7–2.4)

## 2020-06-19 SURGERY — CRANIOTOMY HEMATOMA EVACUATION SUBDURAL
Anesthesia: General | Site: Head | Laterality: Left

## 2020-06-19 MED ORDER — MIDAZOLAM HCL 2 MG/2ML IJ SOLN
INTRAMUSCULAR | Status: AC
  Filled 2020-06-19: qty 2

## 2020-06-19 MED ORDER — THROMBIN 20000 UNITS EX KIT
PACK | CUTANEOUS | Status: AC
  Filled 2020-06-19: qty 1

## 2020-06-19 MED ORDER — PROPOFOL 10 MG/ML IV BOLUS
INTRAVENOUS | Status: AC
  Filled 2020-06-19: qty 40

## 2020-06-19 MED ORDER — LIDOCAINE-EPINEPHRINE 1 %-1:100000 IJ SOLN
INTRAMUSCULAR | Status: DC | PRN
  Administered 2020-06-19: 10 mL

## 2020-06-19 MED ORDER — POTASSIUM CHLORIDE 10 MEQ/50ML IV SOLN
10.0000 meq | INTRAVENOUS | Status: AC
  Administered 2020-06-19 (×3): 10 meq via INTRAVENOUS
  Filled 2020-06-19: qty 50

## 2020-06-19 MED ORDER — 0.9 % SODIUM CHLORIDE (POUR BTL) OPTIME
TOPICAL | Status: DC | PRN
  Administered 2020-06-19 (×3): 1000 mL

## 2020-06-19 MED ORDER — LEVETIRACETAM 100 MG/ML PO SOLN: 1000.0000 mg | Freq: Two times a day (BID) | ORAL | Status: DC

## 2020-06-19 MED ORDER — PROPOFOL 500 MG/50ML IV EMUL
INTRAVENOUS | Status: DC | PRN
  Administered 2020-06-19: 50 ug/kg/min via INTRAVENOUS

## 2020-06-19 MED ORDER — SODIUM CHLORIDE 0.9 % IV SOLN: INTRAVENOUS | Status: AC

## 2020-06-19 MED ORDER — MIDAZOLAM HCL 2 MG/2ML IJ SOLN
INTRAMUSCULAR | Status: DC | PRN
  Administered 2020-06-19: 1 mg via INTRAVENOUS

## 2020-06-19 MED ORDER — SODIUM CHLORIDE 0.9 % IV SOLN: INTRAVENOUS | Status: DC | PRN

## 2020-06-19 MED ORDER — DEXAMETHASONE SODIUM PHOSPHATE 10 MG/ML IJ SOLN
INTRAMUSCULAR | Status: DC | PRN
  Administered 2020-06-19: 10 mg via INTRAVENOUS

## 2020-06-19 MED ORDER — BACITRACIN ZINC 500 UNIT/GM EX OINT
TOPICAL_OINTMENT | CUTANEOUS | Status: AC
  Filled 2020-06-19: qty 28.35

## 2020-06-19 MED ORDER — PHENYLEPHRINE 40 MCG/ML (10ML) SYRINGE FOR IV PUSH (FOR BLOOD PRESSURE SUPPORT)
PREFILLED_SYRINGE | INTRAVENOUS | Status: DC | PRN
  Administered 2020-06-19: 120 ug via INTRAVENOUS

## 2020-06-19 MED ORDER — ONDANSETRON HCL 4 MG/2ML IJ SOLN
INTRAMUSCULAR | Status: AC
  Filled 2020-06-19: qty 2

## 2020-06-19 MED ORDER — PHENYLEPHRINE HCL-NACL 10-0.9 MG/250ML-% IV SOLN
INTRAVENOUS | Status: DC | PRN
  Administered 2020-06-19: 15 ug/min via INTRAVENOUS

## 2020-06-19 MED ORDER — SUGAMMADEX SODIUM 200 MG/2ML IV SOLN
INTRAVENOUS | Status: DC | PRN
  Administered 2020-06-19: 200 mg via INTRAVENOUS

## 2020-06-19 MED ORDER — DEXAMETHASONE SODIUM PHOSPHATE 10 MG/ML IJ SOLN
INTRAMUSCULAR | Status: AC
  Filled 2020-06-19: qty 1

## 2020-06-19 MED ORDER — BACITRACIN ZINC 500 UNIT/GM EX OINT
TOPICAL_OINTMENT | CUTANEOUS | Status: DC | PRN
  Administered 2020-06-19: 1 via TOPICAL

## 2020-06-19 MED ORDER — ROCURONIUM BROMIDE 10 MG/ML (PF) SYRINGE
PREFILLED_SYRINGE | INTRAVENOUS | Status: AC
  Filled 2020-06-19: qty 10

## 2020-06-19 MED ORDER — PROPOFOL 1000 MG/100ML IV EMUL
5.0000 ug/kg/min | INTRAVENOUS | Status: DC
  Administered 2020-06-19: 20 ug/kg/min via INTRAVENOUS
  Administered 2020-06-19: 25 ug/kg/min via INTRAVENOUS
  Administered 2020-06-20: 20 ug/kg/min via INTRAVENOUS
  Filled 2020-06-19 (×2): qty 100

## 2020-06-19 MED ORDER — LIDOCAINE-EPINEPHRINE 1 %-1:100000 IJ SOLN
INTRAMUSCULAR | Status: AC
  Filled 2020-06-19: qty 1

## 2020-06-19 MED ORDER — FENTANYL CITRATE (PF) 250 MCG/5ML IJ SOLN
INTRAMUSCULAR | Status: AC
  Filled 2020-06-19: qty 5

## 2020-06-19 MED ORDER — THROMBIN 20000 UNITS EX SOLR
CUTANEOUS | Status: DC | PRN
  Administered 2020-06-19: 20 mL via TOPICAL

## 2020-06-19 MED ORDER — LEVETIRACETAM 100 MG/ML PO SOLN
500.0000 mg | Freq: Two times a day (BID) | ORAL | Status: DC
  Administered 2020-06-19: 500 mg
  Filled 2020-06-19: qty 5

## 2020-06-19 MED ORDER — FENTANYL CITRATE (PF) 250 MCG/5ML IJ SOLN
INTRAMUSCULAR | Status: DC | PRN
  Administered 2020-06-19: 50 ug via INTRAVENOUS
  Administered 2020-06-19 (×2): 100 ug via INTRAVENOUS

## 2020-06-19 MED ORDER — DEXMEDETOMIDINE (PRECEDEX) IN NS 20 MCG/5ML (4 MCG/ML) IV SYRINGE
PREFILLED_SYRINGE | INTRAVENOUS | Status: AC
  Filled 2020-06-19: qty 10

## 2020-06-19 MED ORDER — LEVETIRACETAM 100 MG/ML PO SOLN
1000.0000 mg | Freq: Two times a day (BID) | ORAL | Status: DC
  Administered 2020-06-19 – 2020-06-21 (×4): 1000 mg
  Filled 2020-06-19 (×4): qty 10

## 2020-06-19 MED ORDER — ALBUMIN HUMAN 5 % IV SOLN: INTRAVENOUS | Status: DC | PRN

## 2020-06-19 MED ORDER — ROCURONIUM BROMIDE 10 MG/ML (PF) SYRINGE
PREFILLED_SYRINGE | INTRAVENOUS | Status: DC | PRN
  Administered 2020-06-19 (×2): 50 mg via INTRAVENOUS

## 2020-06-19 MED ORDER — LEVETIRACETAM 100 MG/ML PO SOLN: 500.0000 mg | Freq: Once | ORAL | Status: AC

## 2020-06-19 MED ORDER — THROMBIN 5000 UNITS EX SOLR
OROMUCOSAL | Status: DC | PRN
  Administered 2020-06-19: 5 mL via TOPICAL

## 2020-06-19 MED ORDER — THROMBIN 20000 UNITS EX SOLR
CUTANEOUS | Status: AC
  Filled 2020-06-19: qty 20000

## 2020-06-19 MED ORDER — PROPOFOL 10 MG/ML IV BOLUS
INTRAVENOUS | Status: DC | PRN
  Administered 2020-06-19 (×2): 50 mg via INTRAVENOUS

## 2020-06-19 SURGICAL SUPPLY — 72 items
ADH SKN CLS APL DERMABOND .7 (GAUZE/BANDAGES/DRESSINGS) ×1
BLADE CLIPPER SURG (BLADE) ×2 IMPLANT
BNDG GAUZE ELAST 4 BULKY (GAUZE/BANDAGES/DRESSINGS) IMPLANT
BUR ACORN 9.0 PRECISION (BURR) ×2 IMPLANT
BUR MATCHSTICK NEURO 3.0 LAGG (BURR) IMPLANT
BUR SPIRAL ROUTER 2.3 (BUR) ×2 IMPLANT
CANISTER SUCT 3000ML PPV (MISCELLANEOUS) ×4 IMPLANT
CLIP VESOCCLUDE MED 6/CT (CLIP) IMPLANT
COVER WAND RF STERILE (DRAPES) IMPLANT
DERMABOND ADVANCED (GAUZE/BANDAGES/DRESSINGS) ×1
DERMABOND ADVANCED .7 DNX12 (GAUZE/BANDAGES/DRESSINGS) IMPLANT
DRAIN JACKSON PRATT 10MM FLAT (MISCELLANEOUS) ×1 IMPLANT
DRAPE NEUROLOGICAL W/INCISE (DRAPES) ×2 IMPLANT
DRAPE SHEET LG 3/4 BI-LAMINATE (DRAPES) ×2 IMPLANT
DRAPE SURG 17X23 STRL (DRAPES) IMPLANT
DRAPE WARM FLUID 44X44 (DRAPES) ×2 IMPLANT
DURAGUARD 06CMX08CM ×2 IMPLANT
DURAPREP 6ML APPLICATOR 50/CS (WOUND CARE) ×3 IMPLANT
ELECT REM PT RETURN 9FT ADLT (ELECTROSURGICAL) ×2
ELECTRODE REM PT RTRN 9FT ADLT (ELECTROSURGICAL) ×1 IMPLANT
EVACUATOR 1/8 PVC DRAIN (DRAIN) IMPLANT
EVACUATOR SILICONE 100CC (DRAIN) IMPLANT
GAUZE 4X4 16PLY RFD (DISPOSABLE) IMPLANT
GAUZE SPONGE 4X4 12PLY STRL (GAUZE/BANDAGES/DRESSINGS) ×1 IMPLANT
GLOVE BIO SURGEON STRL SZ 6.5 (GLOVE) ×2 IMPLANT
GLOVE BIO SURGEON STRL SZ8 (GLOVE) ×1 IMPLANT
GLOVE BIO SURGEON STRL SZ8.5 (GLOVE) ×1 IMPLANT
GLOVE BIOGEL PI IND STRL 7.5 (GLOVE) ×1 IMPLANT
GLOVE BIOGEL PI INDICATOR 7.5 (GLOVE) ×1
GLOVE ECLIPSE 7.5 STRL STRAW (GLOVE) IMPLANT
GLOVE EXAM NITRILE LRG STRL (GLOVE) IMPLANT
GLOVE EXAM NITRILE XL STR (GLOVE) IMPLANT
GLOVE EXAM NITRILE XS STR PU (GLOVE) IMPLANT
GLOVE SURG UNDER POLY LF SZ6.5 (GLOVE) ×4 IMPLANT
GLOVE SURG UNDER POLY LF SZ7 (GLOVE) ×1 IMPLANT
GOWN STRL REUS W/ TWL LRG LVL3 (GOWN DISPOSABLE) ×3 IMPLANT
GOWN STRL REUS W/ TWL XL LVL3 (GOWN DISPOSABLE) IMPLANT
GOWN STRL REUS W/TWL 2XL LVL3 (GOWN DISPOSABLE) IMPLANT
GOWN STRL REUS W/TWL LRG LVL3 (GOWN DISPOSABLE) ×6
GOWN STRL REUS W/TWL XL LVL3 (GOWN DISPOSABLE) ×2
GRAFT DURAGEN MATRIX 3WX3L (Graft) IMPLANT
GRAFT DURAGEN MATRIX 3X3 SNGL (Graft) IMPLANT
HEMOSTAT POWDER KIT SURGIFOAM (HEMOSTASIS) ×2 IMPLANT
HEMOSTAT SURGICEL 2X14 (HEMOSTASIS) ×1 IMPLANT
KIT BASIN OR (CUSTOM PROCEDURE TRAY) ×2 IMPLANT
KIT TURNOVER KIT B (KITS) ×2 IMPLANT
NEEDLE HYPO 22GX1.5 SAFETY (NEEDLE) ×2 IMPLANT
NS IRRIG 1000ML POUR BTL (IV SOLUTION) ×2 IMPLANT
PACK CRANIOTOMY CUSTOM (CUSTOM PROCEDURE TRAY) ×2 IMPLANT
PATTIES SURGICAL .5 X.5 (GAUZE/BANDAGES/DRESSINGS) IMPLANT
PATTIES SURGICAL .5 X3 (DISPOSABLE) IMPLANT
PATTIES SURGICAL 1X1 (DISPOSABLE) IMPLANT
SPONGE NEURO XRAY DETECT 1X3 (DISPOSABLE) IMPLANT
SPONGE SURGIFOAM ABS GEL 100 (HEMOSTASIS) ×2 IMPLANT
STAPLER VISISTAT 35W (STAPLE) ×2 IMPLANT
STOCKINETTE 6  STRL (DRAPES)
STOCKINETTE 6 STRL (DRAPES) IMPLANT
SUT ETHILON 3 0 FSL (SUTURE) IMPLANT
SUT ETHILON 3 0 PS 1 (SUTURE) IMPLANT
SUT MON AB 3-0 SH 27 (SUTURE) ×2
SUT MON AB 3-0 SH27 (SUTURE) IMPLANT
SUT NURALON 4 0 TR CR/8 (SUTURE) ×4 IMPLANT
SUT VIC AB 0 CT1 18XCR BRD8 (SUTURE) ×2 IMPLANT
SUT VIC AB 0 CT1 8-18 (SUTURE) ×2
SUT VIC AB 2-0 CP2 18 (SUTURE) ×4 IMPLANT
SUT VIC AB 3-0 SH 8-18 (SUTURE) ×4 IMPLANT
TOWEL GREEN STERILE (TOWEL DISPOSABLE) ×2 IMPLANT
TOWEL GREEN STERILE FF (TOWEL DISPOSABLE) ×2 IMPLANT
TRAY FOLEY MTR SLVR 16FR STAT (SET/KITS/TRAYS/PACK) ×1 IMPLANT
TUBE CONNECTING 12X1/4 (SUCTIONS) ×2 IMPLANT
UNDERPAD 30X36 HEAVY ABSORB (UNDERPADS AND DIAPERS) ×1 IMPLANT
WATER STERILE IRR 1000ML POUR (IV SOLUTION) ×2 IMPLANT

## 2020-06-19 NOTE — Progress Notes (Signed)
PT returned form OR. vLTM EEG  restarted with new leads. Notified neuro

## 2020-06-19 NOTE — Progress Notes (Signed)
  NEUROSURGERY PROGRESS NOTE   No issues overnight.  EXAM:  BP (!) 135/58   Pulse 71   Temp 98.2 F (36.8 C) (Oral)   Resp (!) 22   Ht 5\' 4"  (1.626 m)   Wt 101 kg   SpO2 100%   BMI 38.22 kg/m   Awakens to voice PERRL, L gaze preference Follows commands by squeezing left hand and wiggling left toes Antigravity to pain with LUE/LLE No movement RLE/RUE  IMPRESSION/PLAN 60 y.o. female POD2 coiling ruptured LMCA complicated by intraop rupture, left MCA infarct. Relatively stable neurologically.  - Left MCA infarct: continued evolution as expected. MLS approximately 64mm.May consider hemicraniectomy. Will discuss with Dr 4m. - continue supportive care, keppra, nimotop - continue 3%.  - appreciate CCM management

## 2020-06-19 NOTE — Progress Notes (Signed)
Spoke with on call NS provider, Denise Savage, regarding disparity between aline and bp cuff, in which Aline is reading much higher than bp cuff.  Was given orders to take a manual BP and if the manual bp was closer to the cuff than to remove the Aline.  Manual pressure was consistent with bp cuff.  Will leave Aline til 0500 labs and then remove.  Will disregard aline readings until then.

## 2020-06-19 NOTE — Progress Notes (Signed)
Patient transported to CT and back without complications.  

## 2020-06-19 NOTE — Progress Notes (Signed)
Patient returned from OR. Patient placed back on the ventilator on full support.

## 2020-06-19 NOTE — Anesthesia Procedure Notes (Signed)
Central Venous Catheter Insertion Performed by: Lannie Fields, DO, anesthesiologist Start/End1/12/2020 1:18 PM, 06/19/2020 1:28 PM Patient location: Pre-op. Preanesthetic checklist: patient identified, IV checked, site marked, risks and benefits discussed, surgical consent, monitors and equipment checked, pre-op evaluation, timeout performed and anesthesia consent Lidocaine 1% used for infiltration and patient sedated Hand hygiene performed  and maximum sterile barriers used  Catheter size: 8 Fr Total catheter length 16. Central line was placed.Double lumen Procedure performed using ultrasound guided technique. Ultrasound Notes:anatomy identified, needle tip was noted to be adjacent to the nerve/plexus identified, no ultrasound evidence of intravascular and/or intraneural injection and image(s) printed for medical record Attempts: 1 Following insertion, dressing applied and line sutured. Post procedure assessment: blood return through all ports  Patient tolerated the procedure well with no immediate complications.

## 2020-06-19 NOTE — Progress Notes (Signed)
   06/19/20 1037  Clinical Encounter Type  Visited With Patient not available  Visit Type Initial  Referral From Nurse  Consult/Referral To Chaplain   Chaplain responded to AD request. Chaplain spoke with Pt's nurse, Susie. Pt unable to complete AD at this time. Care team will reach out for follow-up as needed.   This note was prepared by Chaplain Resident, Tacy Learn, MDiv. Chaplain remains available as needed through the on-call pager: [phone].

## 2020-06-19 NOTE — Op Note (Signed)
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 Despite hypertonic saline treatment, she continued to have worsening midline shift and began having worsening exam today and seizures with 28mm of MLS on repeat CT. I therefore recommended a hemicraniectomy for decompression. I discussed this with the patient's daughter given the patient's mental status. We discussed risks, benefits, and alternatives and she provided consent on behalf of the patient to proceed.   OPERATIVE DETAIL: The patient was taken to the operating room and placed on the OR table in the supine position. A formal time out was performed with two patient identifiers and confirmed the operative site. Anesthesia was induced by the anesthesia team. The operative site was marked, hair was clipped with surgical clippers, the area was then prepped and draped in a sterile fashion. The abdomen was similarly prepped and draped in a sterile fashion on the left for bone flap implantation.  An inverted question mark incision was placed on the left side of the head in the standard fashion. A musculocutaneous scalp flap was created and a high speed drill was used to create burr holes then a large decompressive craniectomy was created with the footplate attachment. The dura was opened in a pie-crust fashion to maximize decompression. The brain was, as expected, under significant pressure. Two layers or duragaurd were placed to prevent scarring of the brain to the scalp flap for future cranioplasty. The wound was  copiously irrigated, a subgaleal drain was inserted, tunneled, and secured. The cranial incision was then closed in layers and attention was turned to the abdomen.  A transverse incision was made in the abdomen just lateral to the umbilicus. A pocket was created in the subcutaneous tissues and the bone flap was cleaned of tissue, irrigated, then placed into the pocket after hemostasis was confirmed. All instrument and sponge counts were correct, the incision was then closed in layers. The patient was then returned to anesthesia for emergence. No apparent complications at the completion of the procedure.   EBL:    DRAINS: Subgaleal drain connected to bulb suction   SPECIMENS: none   Jadene Pierini, MD 06/19/20 12:26 PM

## 2020-06-19 NOTE — Progress Notes (Signed)
NAME:  Denise Savage, MRN:  [id number], DOB:  [birth year], LOS: 3 ADMISSION DATE:  06/15/2020, CONSULTATION DATE:  06/16/20 REFERRING MD:  Dr. Maurice Small, CHIEF COMPLAINT:  VDRF   Brief History:  60 year old female presented headache x 5 days found to have left sylvian fissure subarachnoid hemorrhage with a left MCA bifurcation aneurysm.  Transferred to [facility] for further care and went for diagnostic angiography with coiling, complicated by intraoperative rupture and subsequent severe vasospasm leading to malignant left MCA stroke.  She returned to the ICU intubated.  Past Medical History:   has a past medical history of Abdominal hernia.  Significant Hospital Events:  1/3 present to [facility] emergency department 1/4 transferred to [facility], NSGY admitting/ taken to neuro IR for coiling.  Remained on vent postop 1/5  more lethargic and unresponsive, stat head CT was done which showed progressive left MCA infarct with brain edema and 6 mm midline shift.  Persistent nonprogressive subarachnoid hemorrhage. 1/6 worsening edema on CT, 3% saline continued  Consults:  Neuro IR PCCM  Procedures:  1/4 diagnostic angiogram with coiling  Significant Diagnostic Tests:  1/3 CT angiogram head neck > small volume acute subarachnoid hemorrhage centered at the base of the left sylvian fissure.  6 x 6 x 9 mm irregular aneurysm arising from the left MCA bifurcation.  1/5 CTH >> 1. Diffused and partially reabsorbed subarachnoid hemorrhage since yesterday. 2. 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  1/6 CTH: Progressive swelling of the left MCA infarct with new midline shift measuring 6 mm. Nonprogressive subarachnoid hemorrhage. No evidence of new territory infarct 1/6 CTH -increasing midline shift 8 mm  1/6 LTM EEG evidence of epileptogenicity arising from right parietal region , no seizures  Micro Data:  1/3 SARS/ flu >>neg 1/4 MRSA  >> neg  Antimicrobials:   n/a  Interim  History / Subjective:   Intubated Afebrile On 3% saline at 50 cc an hour Good urine output 4.5 L   Objective   Blood pressure (!) 121/46, pulse 69, temperature 99.1 F (37.3 C), temperature source Axillary, resp. rate (!) 23, height 5\' 4"  (1.626 m), weight 101 kg, SpO2 100 %.    Vent Mode: PSV;CPAP FiO2 (%):  [40 %] 40 % Set Rate:  [16 bmp] 16 bmp Vt Set:  [430 mL] 430 mL PEEP:  [5 cmH20] 5 cmH20 Pressure Support:  [10 cmH20] 10 cmH20 Plateau Pressure:  [17 cmH20-22 cmH20] 17 cmH20   Intake/Output Summary (Last 24 hours) at 06/19/2020 0847 Last data filed at 06/19/2020 0800 Gross per 24 hour  Intake 3518.93 ml  Output 4625 ml  Net -1106.07 ml   Filed Weights   06/15/20 1829 06/18/20 0500 06/19/20 0500  Weight: 100.7 kg 100 kg 101 kg    Examination: Gen:      elderly woman, no distress , intubated HEENT:  EOMI, sclera anicteric, mild pallor Neck:     No JVD; no thyromegaly Lungs:    Bilateral ventilated breath sounds CV:         Regular rate and rhythm; no murmurs Abd:      + bowel sounds; soft, non-tender; no palpable masses, no distension Ext:    No edema; adequate peripheral perfusion Skin:      Warm and dry; no rash Neuro: Unresponsive, spontaneously moves left upper extremity and lower extremity, purposeful on left arm to painful stimulus but does not follow commands, pupils bilateral pinpoint not reactive to light.   Labs show mild hypernatremia 147 ,  leukocytosis, low phosphorus.  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 and midline shift Acute encephalopathy due to subarachnoid hemorrhage and acute stroke -Midline shift has increased to 8 mm -Evidence of epileptogenicity on LTM EEG  -Neurosurgery considering hemicraniectomy, repeat  CT planned -We will increase 3% saline to 75 for goal sodium 1 50-1 55 range, once PICC line obtained this can be increased further if needed -Neurochecks, intermittently follows commands -Continue Keppra for seizure prophylaxis -LTM EEG per neuro sx, will discontinue if she goes for surgery  Acute hypoxic respiratory failure  -Tolerates pressure support weaning but clearly not a candidate for extubation   Hypomagnesemia, hypophosphatemia and hypokalemia -Repleted, recheck  Leukocytosis -recheck low threshold to add antibiotics for fever  Best practice (evaluated daily)  Diet: NPO, tube feeds Pain/Anxiety/Delirium protocol (if indicated): As needed fentanyl with RASS goal 0/-1 VAP protocol (if indicated): Per protocol DVT prophylaxis: SCDs only  GI prophylaxis: PPI Glucose control: trend, with goal FS 140-180 Mobility: Bedrest Disposition: ICU  Goals of Care:  Last date of multidisciplinary goals of care discussion: 1/5 Family and staff present: daughter , Per neurosurgery Summary of discussion: Continue full aggressive care Follow up goals of care discussion due: 1/12 Code Status: Full   Labs   CBC: Recent Labs  Lab 06/16/20 1420 06/16/20 2148 06/16/20 2213 06/17/20 0316 06/17/20 0939 06/18/20 0633  WBC 7.9  --  9.2 10.7*  --  18.1*  NEUTROABS  --   --   --   --   --  16.1*  HGB 13.3 12.9 12.8 12.6 13.6 12.6  HCT 40.4 38.0 37.7 39.2 40.0 38.3  MCV 86.5  --  85.9 86.5  --  86.5  PLT 248  --  232 224  --  536    Basic Metabolic Panel: Recent Labs  Lab 06/15/20 1959 06/16/20 1420 06/16/20 2148 06/16/20 2213 06/17/20 0316 06/17/20 0820 06/17/20 0939 06/17/20 0954 06/17/20 1218 06/17/20 1528 06/17/20 2121 06/18/20 0633 06/18/20 1105 06/18/20 1708 06/18/20 2131 06/19/20 0607  NA 139 135 139 135 135  --  137   < >  --    < > 140 144 149* 145 145 147*  K 3.6 3.5 3.4* 3.4* 3.6  --  3.4*  --   --   --   --  3.5  --   --   --   --   CL 103 100  --  104  102  --   --   --   --   --   --  111  --   --   --   --   CO2  --  26  --  23 23  --   --   --   --   --   --  24  --   --   --   --   GLUCOSE 90 101*  --  156* 160*  --   --   --   --   --   --  141*  --   --   --   --   BUN 17 10  --  11 13  --   --   --   --   --   --  18  --   --   --   --  CREATININE 0.90 0.80  --  0.81 0.75  --   --   --   --   --   --  0.73  --   --   --   --   CALCIUM  --  10.8*  --  10.1 10.3  --   --   --   --   --   --  10.8*  --   --   --   --   MG  --   --   --   --   --  1.6*  --   --   --   --  2.2 2.2  --  1.9  --  1.8  PHOS  --   --   --   --   --   --   --   --  2.5  --  2.1* 1.3*  --  2.0*  --   --    < > = values in this interval not displayed.   GFR: Estimated Creatinine Clearance: 86.4 mL/min (by C-G formula based on SCr of 0.73 mg/dL). Recent Labs  Lab 06/16/20 1420 06/16/20 2213 06/17/20 0316 06/18/20 0633  WBC 7.9 9.2 10.7* 18.1*    Liver Function Tests: Recent Labs  Lab 06/18/20 0633  ALBUMIN 3.1*   No results for input(s): LIPASE, AMYLASE in the last 168 hours. No results for input(s): AMMONIA in the last 168 hours.  ABG    Component Value Date/Time   PHART 7.416 06/17/2020 0939   PCO2ART 35.4 06/17/2020 0939   PO2ART 85 06/17/2020 0939   HCO3 22.8 06/17/2020 0939   TCO2 24 06/17/2020 0939   ACIDBASEDEF 1.0 06/17/2020 0939   O2SAT 97.0 06/17/2020 0939     Coagulation Profile: Recent Labs  Lab 06/16/20 1420  INR 1.1    Cardiac Enzymes: No results for input(s): CKTOTAL, CKMB, CKMBINDEX, TROPONINI in the last 168 hours.  HbA1C: Hgb A1c MFr Bld  Date/Time Value Ref Range Status  06/17/2020 03:16 AM 5.6 4.8 - 5.6 % Final    Comment:    (NOTE) Pre diabetes:          5.7%-6.4%  Diabetes:              >6.4%  Glycemic control for   <7.0% adults with diabetes     CBG: Recent Labs  Lab 06/18/20 1517 06/18/20 1925 06/18/20 2317 06/19/20 0318 06/19/20 0736  GLUCAP 163* 126* 127* 130* 132*     Critical care  time: 31 minutes.  The treatment and management of the patient's condition was required based on the threat of imminent deterioration. This time reflects time spent by the physician evaluating, providing care and managing the critically ill patient's care. The time was spent at the immediate bedside (or on the same floor/unit and dedicated to this patient's care). Time involved in separately billable procedures is NOT included int he critical care time indicated above. Family meeting and update time may be included above if and only if the patient is unable/incompetent to participate in clinical interview and/or decision making, and the discussion was necessary to determining treatment decisions  Cyril Mourning MD. Tonny Bollman. [facility] See Amion for pager  If no response to pager , please call [phone]  After 7:00 pm call Elink  [phone]   06/19/2020

## 2020-06-19 NOTE — Progress Notes (Signed)
vLTM EEG D/C due to patient going to the OR.

## 2020-06-19 NOTE — Progress Notes (Addendum)
Patient unavaible at this time. Sterile procedure being preformed.

## 2020-06-19 NOTE — Brief Op Note (Signed)
06/19/2020  3:27 PM  PATIENT:  Denise Savage  60 y.o. female  PRE-OPERATIVE DIAGNOSIS:  Malignant cerebral edema  POST-OPERATIVE DIAGNOSIS:  Same  PROCEDURE:  Left hemicraniectomy  SURGEON:  Surgeon(s) and Role:    * Jadene Pierini, MD - Primary    * Tressie Stalker, MD - Assisting  PHYSICIAN ASSISTANT:   ANESTHESIA:   general  EBL:  500 mL   BLOOD ADMINISTERED:none  DRAINS: Subgaleal drain   LOCAL MEDICATIONS USED:  LIDOCAINE   SPECIMEN:  No Specimen  DISPOSITION OF SPECIMEN:  N/A  COUNTS:  YES  TOURNIQUET:  * No tourniquets in log *  DICTATION: .Note written in EPIC  PLAN OF CARE: Admit to inpatient   PATIENT DISPOSITION:  ICU, hemodynamically stable   Delay start of Pharmacological VTE agent (>24hrs) due to surgical blood loss or risk of bleeding: yes

## 2020-06-19 NOTE — Progress Notes (Signed)
Attempted to place PICC on left but unable to thread into SVC despite multiple attempts and techniques to pass catheter.  Susie, RN updated.  Unable to attempt on right due to flaccid extremity.

## 2020-06-19 NOTE — Anesthesia Postprocedure Evaluation (Signed)
Anesthesia Post Note  Patient: Denise Savage  Procedure(s) Performed: CRANIOTOMY HEMATOMA EVACUATION SUBDURAL (Left Head)     Patient location during evaluation: SICU Anesthesia Type: General Level of consciousness: sedated Pain management: pain level controlled Vital Signs Assessment: post-procedure vital signs reviewed and stable Respiratory status: patient remains intubated per anesthesia plan Cardiovascular status: stable Postop Assessment: no apparent nausea or vomiting Anesthetic complications: no   No complications documented.  Last Vitals:  Vitals:   06/19/20 1300 06/19/20 1548  BP: (!) 148/91 (!) 182/93  Pulse: 66 78  Resp: (!) 22 17  Temp:    SpO2: 99% 100%    Last Pain:  Vitals:   06/19/20 0800  TempSrc: Axillary  PainSc:                  Maribelle Hopple,W. EDMOND

## 2020-06-19 NOTE — Anesthesia Procedure Notes (Addendum)
Arterial Line Insertion Start/End1/12/2020 1:30 PM, 06/19/2020 1:35 PM Performed by: Lannie Fields, DO, anesthesiologist  Patient location: OR. Preanesthetic checklist: patient identified, IV checked, site marked, risks and benefits discussed, surgical consent, monitors and equipment checked, pre-op evaluation, timeout performed and anesthesia consent Right, radial was placed Catheter size: 20 G Hand hygiene performed  and maximum sterile barriers used  Allen's test indicative of satisfactory collateral circulation Attempts: 1 Procedure performed without using ultrasound guided technique. Following insertion, dressing applied and Biopatch. Patient tolerated the procedure well with no immediate complications.

## 2020-06-19 NOTE — Transfer of Care (Signed)
Immediate Anesthesia Transfer of Care Note  Patient: Denise Savage  Procedure(s) Performed: CRANIOTOMY HEMATOMA EVACUATION SUBDURAL (Left Head)  Patient Location: ICU  Anesthesia Type:General  Level of Consciousness: Patient remains intubated per anesthesia plan  Airway & Oxygen Therapy: Patient remains intubated per anesthesia plan and Patient placed on Ventilator (see vital sign flow sheet for setting)  Post-op Assessment: Report given to RN and Post -op Vital signs reviewed and stable  Post vital signs: Reviewed and stable  Last Vitals:  Vitals Value Taken Time  BP 136/81 06/19/20 1600  Temp    Pulse 67 06/19/20 1602  Resp 16 06/19/20 1602  SpO2 100 % 06/19/20 1602  Vitals shown include unvalidated device data.  Last Pain:  Vitals:   06/19/20 0800  TempSrc: Axillary  PainSc:          Complications: No complications documented.

## 2020-06-19 NOTE — Anesthesia Preprocedure Evaluation (Signed)
Anesthesia Evaluation    Reviewed: Allergy & Precautions, Patient's Chart, lab work & pertinent test results  Airway Mallampati: Intubated       Dental no notable dental hx.    Pulmonary neg pulmonary ROS,    Pulmonary exam normal breath sounds clear to auscultation       Cardiovascular negative cardio ROS Normal cardiovascular exam Rhythm:Regular Rate:Normal     Neuro/Psych  Headaches, Aneurysmal subarachnoid hemorrhage due to ruptured left MCA aneurysm s/p coiling 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 and midline shift Acute encephalopathy due to subarachnoid hemorrhage and acute stroke -Midline shift has increased to 8 mm -Evidence of epileptogenicity on LTM EEG CVA negative psych ROS   GI/Hepatic negative GI ROS, Neg liver ROS,   Endo/Other  Obesity BMI 38  Renal/GU negative Renal ROS  negative genitourinary   Musculoskeletal negative musculoskeletal ROS (+)   Abdominal   Peds  Hematology negative hematology ROS (+)   Anesthesia Other Findings presented headache x 5 days found to have left sylvian fissure subarachnoid hemorrhage with a left MCA bifurcation aneurysm.  Transferred to [facility] for further care and went for diagnostic angiography with coiling, complicated by intraoperative rupture and subsequent severe vasospasm leading to malignant left MCA stroke  Reproductive/Obstetrics negative OB ROS                             Anesthesia Physical Anesthesia Plan  ASA: IV  Anesthesia Plan: General   Post-op Pain Management:    Induction: Intravenous  PONV Risk Score and Plan: 3 and Treatment may vary due to age or medical condition  Airway Management Planned: Oral ETT  Additional Equipment: Arterial line  Intra-op Plan:   Post-operative Plan: Post-operative intubation/ventilation  Informed Consent: I have reviewed the patients History and Physical, chart,  labs and discussed the procedure including the risks, benefits and alternatives for the proposed anesthesia with the patient or authorized representative who has indicated his/her understanding and acceptance.       Plan Discussed with: CRNA  Anesthesia Plan Comments:         Anesthesia Quick Evaluation

## 2020-06-19 NOTE — Anesthesia Procedure Notes (Deleted)
Arterial Line Insertion Start/End1/12/2020 1:30 PM, 06/19/2020 1:35 PM Performed by: Lannie Fields, DO, anesthesiologist  Patient location: Pre-op. Preanesthetic checklist: patient identified, IV checked, site marked, risks and benefits discussed, surgical consent, monitors and equipment checked, pre-op evaluation, timeout performed and anesthesia consent Lidocaine 1% used for infiltration Right, radial was placed Catheter size: 20 Fr Hand hygiene performed  and maximum sterile barriers used   Attempts: 1 Procedure performed without using ultrasound guided technique. Following insertion, dressing applied. Post procedure assessment: normal and unchanged  Patient tolerated the procedure well with no immediate complications.

## 2020-06-19 NOTE — Procedures (Signed)
Patient Name:Denise Savage:[id number] Epilepsy Attending:Michah Minton [name] Referring Physician/Provider: Dr. Cindra Presume Duration: 06/18/2020 1834 to  06/19/2020  1041, 1654- 06/20/2020 0100  Patient history:60 year old female with ruptured left MCA aneurysm status post coiling,continues to be encephalopathic.EEG to evaluate for seizures.  Level of alertness:comatose  AEDs during EEG study:Keppra, propofol  Technical aspects: This EEG study was done with scalp electrodes positioned according to the 10-20 International system of electrode placement. Electrical activity was acquired at a sampling rate of 500Hz  and reviewed with a high frequency filter of 70Hz  and a low frequency filter of 1Hz . EEG data were recorded continuously and digitally stored.   Description:EEG initially showeded continuous generalized 3 to 5 Hz low amplitude theta-delta slowing in left hemisphere. There is 6-9 Hz theta and alpha activity in right hemisphere as well as intermittent rhythmic 2 to 3 Hz delta slowing. Spikes were noted in right parietal region.  Gradually intermittent rhythmic delta slowing in right hemisphere, maximal right parietal region started appearing more rhythmic with evolution in frequency and involving left frontal region consistent, consistent with seizures without clinical signs, lasting about 20 to 30 seconds, average 8/hour, last seizure at 1041. After eeg was reconnected at 1654, eeg showed continuous generalized 2-3Hz  delta slowing with sharply contoured waves in left frontal region consistent breech artifact. Sharp waves were seen in left frontal region.   ABNORMALITY -Seizure without clinical signs, right hemisphere, maximal right parietal region -Spikes, right parietal region -Sharp waves, left frontal region -Continuous slow, generalized and lateralized left hemisphere - Breech artifact, left frontal region  IMPRESSION: This study initially showed seizures without  clinical signs arising from right hemisphere, maximal right parietal region, lasting about 20 to 30 seconds, average 8 seizures per hour, last seizure at 1041.   After 1654, eeg showed evidence of cortical dysfunction and potential epileptogenicity in left frontal region consistent with underlying craniotomy. There is also cortical dysfunction in left hemisphereas due to underlying structural abnormality. Additionally there is severe diffuse encephalopathy, nonspecific etiology.   [name] 

## 2020-06-20 ENCOUNTER — Inpatient Hospital Stay (HOSPITAL_COMMUNITY): Payer: Medicaid Other

## 2020-06-20 LAB — GLUCOSE, CAPILLARY
Glucose-Capillary: 117 mg/dL — ABNORMAL HIGH (ref 70–99)
Glucose-Capillary: 101 mg/dL — ABNORMAL HIGH (ref 70–99)
Glucose-Capillary: 119 mg/dL — ABNORMAL HIGH (ref 70–99)
Glucose-Capillary: 114 mg/dL — ABNORMAL HIGH (ref 70–99)
Glucose-Capillary: 131 mg/dL — ABNORMAL HIGH (ref 70–99)
Glucose-Capillary: 111 mg/dL — ABNORMAL HIGH (ref 70–99)

## 2020-06-20 LAB — BASIC METABOLIC PANEL
Anion gap: 11 (ref 5–15)
BUN: 21 mg/dL — ABNORMAL HIGH (ref 6–20)
CO2: 26 mmol/L (ref 22–32)
Calcium: 10.3 mg/dL (ref 8.9–10.3)
Chloride: 111 mmol/L (ref 98–111)
Creatinine, Ser: 0.68 mg/dL (ref 0.44–1.00)
GFR, Estimated: >60 mL/min (ref >60)
Glucose, Bld: 161 mg/dL — ABNORMAL HIGH (ref 70–99)
Potassium: 3.1 mmol/L — ABNORMAL LOW (ref 3.5–5.1)
Sodium: 148 mmol/L — ABNORMAL HIGH (ref 135–145)

## 2020-06-20 LAB — CBC
HCT: 34.9 % — ABNORMAL LOW (ref 36.0–46.0)
Hemoglobin: 10.8 g/dL — ABNORMAL LOW (ref 12.0–15.0)
MCH: 27.8 pg (ref 26.0–34.0)
MCHC: 30.9 g/dL (ref 30.0–36.0)
MCV: 89.9 fL (ref 80.0–100.0)
Platelets: 212 10*3/uL (ref 150–400)
RBC: 3.88 MIL/uL (ref 3.87–5.11)
RDW: 14.7 % (ref 11.5–15.5)
WBC: 17.4 10*3/uL — ABNORMAL HIGH (ref 4.0–10.5)
nRBC: 0 % (ref 0.0–0.2)

## 2020-06-20 LAB — MAGNESIUM: Magnesium: 1.8 mg/dL (ref 1.7–2.4)

## 2020-06-20 LAB — TRIGLYCERIDES: Triglycerides: 223 mg/dL — ABNORMAL HIGH (ref <150)

## 2020-06-20 LAB — PHOSPHORUS: Phosphorus: 2.9 mg/dL (ref 2.5–4.6)

## 2020-06-20 MED ORDER — SODIUM CHLORIDE 0.9 % IV SOLN
3.0000 g | Freq: Four times a day (QID) | INTRAVENOUS | Status: AC
  Administered 2020-06-20 – 2020-06-24 (×17): 3 g via INTRAVENOUS
  Filled 2020-06-20: qty 0.06
  Filled 2020-06-20: qty 0.14
  Filled 2020-06-20 (×2): qty 0.06
  Filled 2020-06-20 (×2): qty 0.14
  Filled 2020-06-20: qty 3
  Filled 2020-06-20: qty 8
  Filled 2020-06-20: qty 0.14
  Filled 2020-06-20: qty 0.06
  Filled 2020-06-20: qty 3
  Filled 2020-06-20: qty 0.14
  Filled 2020-06-20: qty 0.06
  Filled 2020-06-20: qty 8
  Filled 2020-06-20: qty 3
  Filled 2020-06-20: qty 0.06
  Filled 2020-06-20: qty 3

## 2020-06-20 MED ORDER — POTASSIUM CHLORIDE 20 MEQ PO PACK
40.0000 meq | PACK | Freq: Once | ORAL | Status: AC
  Administered 2020-06-20: 40 meq
  Filled 2020-06-20: qty 2

## 2020-06-20 MED ORDER — POTASSIUM CHLORIDE 10 MEQ/50ML IV SOLN
10.0000 meq | INTRAVENOUS | Status: AC
  Administered 2020-06-20 (×4): 10 meq via INTRAVENOUS
  Filled 2020-06-20 (×4): qty 50

## 2020-06-20 MED ORDER — POTASSIUM CHLORIDE 20 MEQ PO PACK
20.0000 meq | PACK | ORAL | Status: AC
  Administered 2020-06-20 (×2): 20 meq
  Filled 2020-06-20 (×2): qty 1

## 2020-06-20 MED ORDER — SODIUM CHLORIDE 0.9 % IV SOLN
3.0000 g | Freq: Four times a day (QID) | INTRAVENOUS | Status: DC
  Administered 2020-06-20: 3 g via INTRAVENOUS
  Filled 2020-06-20: qty 3
  Filled 2020-06-20 (×3): qty 8

## 2020-06-20 MED ORDER — SODIUM CHLORIDE 0.9% FLUSH
10.0000 mL | Freq: Two times a day (BID) | INTRAVENOUS | Status: DC
  Administered 2020-06-20 – 2020-06-26 (×13): 10 mL
  Administered 2020-06-27: 20 mL
  Administered 2020-06-27 – 2020-06-28 (×2): 10 mL
  Administered 2020-06-28: 20 mL
  Administered 2020-06-29 – 2020-07-01 (×4): 10 mL
  Administered 2020-07-01: 20 mL
  Administered 2020-07-01 – 2020-07-04 (×6): 10 mL
  Administered 2020-07-04: 20 mL
  Administered 2020-07-05: 10 mL

## 2020-06-20 MED ORDER — SODIUM CHLORIDE 0.9% FLUSH: 10.0000 mL | INTRAVENOUS | Status: DC | PRN

## 2020-06-20 NOTE — Procedures (Addendum)
Patient Name:Denise Savage XMI:[id number] Epilepsy Attending:Finnley Larusso [name] Referring Physician/Provider:Dr. Cindra Presume Duration: 06/20/2020 0100 to 06/21/2020 0100  Patient history:60 year old female with ruptured left MCA aneurysm status post coiling,continues to be encephalopathic.EEG to evaluate for seizures.  Level of alertness:comatose  AEDs during EEG study:Keppra, propofol  Technical aspects: This EEG study was done with scalp electrodes positioned according to the 10-20 International system of electrode placement. Electrical activity was acquired at a sampling rate of 500Hz  and reviewed with a high frequency filter of 70Hz  and a low frequency filter of 1Hz . EEG data were recorded continuously and digitally stored.   Description:EEG showed continuous generalized 2-3Hz  delta slowing with sharply contoured waves in left frontal region consistent breech artifact. Sharp waves were seen in left frontal region which after propofol was stopped gradually became periodic at 1-1.5hz  and were rhythmic.   ABNORMALITY -Periodic epileptiform discharges, left frontal region -Continuous slow, generalized and lateralizedlefthemisphere - Breech artifact, left frontal region  IMPRESSION: This study showedevidence of cortical dysfunction and epileptogenicity in left frontal region consistent with underlying craniotomy and structural abnormality. After propofol was stopped, eeg showed periodic epileptiform discharges at 1-1.5hz  In left frontal region which were intermittent and rhythmic which is on the ictal-interictal continuum. Additionally there is severe diffuse encephalopathy, nonspecific etiology.   EEG appears to be worsening compared to yesterday.  Jayna Mulnix 

## 2020-06-20 NOTE — Progress Notes (Signed)
Assisted tele visit to patient with family member.  Adriann Ballweg M, RN  

## 2020-06-20 NOTE — Progress Notes (Signed)
NAME:  Denise Savage, MRN:  [id number], DOB:  03-17-1961, LOS: 4 ADMISSION DATE:  06/15/2020, CONSULTATION DATE:  06/16/20 REFERRING MD:  Dr. Maurice Small, CHIEF COMPLAINT:  VDRF   Brief History:  60 year old female presented headache x 5 days found to have left sylvian fissure subarachnoid hemorrhage with a left MCA bifurcation aneurysm.  Transferred to [facility] for further care and went for diagnostic angiography with coiling, complicated by intraoperative rupture and subsequent severe vasospasm leading to malignant left MCA stroke.  She returned to the ICU intubated.  Past Medical History:   has a past medical history of Abdominal hernia.  Significant Hospital Events:  1/3 present to [facility] emergency department 1/4 transferred to [facility], NSGY admitting/ taken to neuro IR for coiling.  Remained on vent postop 1/5  more lethargic and unresponsive, stat head CT was done which showed progressive left MCA infarct with brain edema and 6 mm midline shift.  Persistent nonprogressive subarachnoid hemorrhage. 1/6 worsening edema on CT, 3% saline continued 1/7 Keppra increased for subclinical seizures on LTM EEG  Consults:  Neuro IR PCCM  Procedures:  1/4 diagnostic angiogram with coiling 1/7 left hemicraniectomy  Significant Diagnostic Tests:  1/3 CT angiogram head neck > small volume acute subarachnoid hemorrhage centered at the base of the left sylvian fissure.  6 x 6 x 9 mm irregular aneurysm arising from the left MCA bifurcation.  1/5 CTH >> 1. Diffused and partially reabsorbed subarachnoid hemorrhage since yesterday. 2. 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  1/6 CTH: Progressive swelling of the left MCA infarct with new midline shift measuring 6 mm. Nonprogressive subarachnoid hemorrhage. No evidence of new territory infarct 1/6 CTH -increasing midline shift 8 mm  1/6 LTM EEG evidence of epileptogenicity arising from right parietal region , no seizures  Micro  Data:  1/3 SARS/ flu >>neg 1/4 MRSA  >> neg  Antimicrobials:   n/a  Interim History / Subjective:   Uneventful procedure yesterday Remains critically ill, intubated, sedated with propofol Low-grade febrile 100.9 Good urine output  Objective   Blood pressure (!) 158/74, pulse 74, temperature (!) 100.9 F (38.3 C), temperature source Axillary, resp. rate (!) 21, height 5\' 4"  (1.626 m), weight 102.7 kg, SpO2 100 %.    Vent Mode: PRVC FiO2 (%):  [40 %] 40 % Set Rate:  [16 bmp] 16 bmp Vt Set:  [430 mL] 430 mL PEEP:  [5 cmH20] 5 cmH20 Plateau Pressure:  [15 cmH20-20 cmH20] 17 cmH20   Intake/Output Summary (Last 24 hours) at 06/20/2020 0920 Last data filed at 06/20/2020 08/18/2020 Gross per 24 hour  Intake 2125.57 ml  Output 3100 ml  Net -974.43 ml   Filed Weights   06/18/20 0500 06/19/20 0500 06/20/20 0500  Weight: 100 kg 101 kg 102.7 kg    Examination: Gen:      elderly woman, no distress , intubated HEENT:  EOMI, sclera anicteric, mild pallor, left craniectomy incision appears clean, LTM EEG leads Neck:     No JVD; no thyromegaly Lungs:   No accessory muscle use, bilateral ventilated breath sounds CV:         Regular rate and rhythm; no murmurs Abd:      + bowel sounds; soft, non-tender; no palpable masses, no distension Ext:    No edema; adequate peripheral perfusion Skin:      Warm and dry; no rash Neuro: Unresponsive, sedated on propofol, localizes on left, right hemiplegia   Labs show mild hypernatremia 148, hypokalemia, stable  leukocytosis  Chest x-ray 1/8 independently reviewed, mild bibasilar atelectasis, ET tube in position    Resolved Hospital Problem list     Assessment & Plan:   Aneurysmal subarachnoid hemorrhage due to ruptured left MCA aneurysm s/p coiling 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 and midline shift Acute encephalopathy due to subarachnoid hemorrhage and acute stroke -Midline shift increased to 8 mm >> status post left  hemicraniectomy -Evidence of seizures on LTM EEG  -3% saline discontinued, continue Nimotop and normal saline at 50 cc an hour -Continue Keppra for seizure prophylaxis -LTM EEG per neuro sx  Acute hypoxic respiratory failure -Tolerates pressure support weaning but clearly not a candidate for extubation -Daily W UA and SBT's   Hypokalemia will be repleted and rechecked  Fever with Leukocytosis -panculture and add empiric Unasyn  Best practice (evaluated daily)  Diet: NPO, tube feeds Pain/Anxiety/Delirium protocol (if indicated): Propofol + As needed fentanyl with RASS goal 0/-1 VAP protocol (if indicated): Per protocol DVT prophylaxis: SCDs only  GI prophylaxis: PPI Glucose control: trend, with goal FS 140-180 Mobility: Bedrest Disposition: ICU  Goals of Care:  Last date of multidisciplinary goals of care discussion: 1/5 Family and staff present: daughter , Per neurosurgery Summary of discussion: Continue full aggressive care Follow up goals of care discussion due: 1/12 Code Status: Full   Labs   CBC: Recent Labs  Lab 06/16/20 1420 06/16/20 2148 06/16/20 2213 06/17/20 0316 06/17/20 0939 06/18/20 0633 06/20/20 0358  WBC 7.9  --  9.2 10.7*  --  18.1* 17.4*  NEUTROABS  --   --   --   --   --  16.1*  --   HGB 13.3   < > 12.8 12.6 13.6 12.6 10.8*  HCT 40.4   < > 37.7 39.2 40.0 38.3 34.9*  MCV 86.5  --  85.9 86.5  --  86.5 89.9  PLT 248  --  232 224  --  247 212   < > = values in this interval not displayed.    Basic Metabolic Panel: Recent Labs  Lab 06/16/20 1420 06/16/20 2148 06/16/20 2213 06/17/20 0316 06/17/20 0820 06/17/20 0939 06/17/20 0954 06/17/20 1218 06/17/20 1528 06/17/20 2121 06/18/20 9024 06/18/20 1105 06/18/20 1708 06/18/20 2131 06/19/20 0607 06/19/20 0945 06/20/20 0358  NA 135   < > 135 135  --  137   < >  --    < > 140 144   < > 145 145 147* 148* 148*  K 3.5   < > 3.4* 3.6  --  3.4*  --   --   --   --  3.5  --   --   --   --   --   3.1*  CL 100  --  104 102  --   --   --   --   --   --  111  --   --   --   --   --  111  CO2 26  --  23 23  --   --   --   --   --   --  24  --   --   --   --   --  26  GLUCOSE 101*  --  156* 160*  --   --   --   --   --   --  141*  --   --   --   --   --  161*  BUN 10  --  11 13  --   --   --   --   --   --  18  --   --   --   --   --  21*  CREATININE 0.80  --  0.81 0.75  --   --   --   --   --   --  0.73  --   --   --   --   --  0.68  CALCIUM 10.8*  --  10.1 10.3  --   --   --   --   --   --  10.8*  --   --   --   --   --  10.3  MG  --   --   --   --    < >  --   --   --   --  2.2 2.2  --  1.9  --  1.8  --  1.8  PHOS  --   --   --   --   --   --   --  2.5  --  2.1* 1.3*  --  2.0*  --   --   --  2.9   < > = values in this interval not displayed.   GFR: Estimated Creatinine Clearance: 87.2 mL/min (by C-G formula based on SCr of 0.68 mg/dL). Recent Labs  Lab 06/16/20 2213 06/17/20 0316 06/18/20 0633 06/20/20 0358  WBC 9.2 10.7* 18.1* 17.4*    Liver Function Tests: Recent Labs  Lab 06/18/20 0633  ALBUMIN 3.1*   No results for input(s): LIPASE, AMYLASE in the last 168 hours. No results for input(s): AMMONIA in the last 168 hours.  ABG    Component Value Date/Time   PHART 7.416 06/17/2020 0939   PCO2ART 35.4 06/17/2020 0939   PO2ART 85 06/17/2020 0939   HCO3 22.8 06/17/2020 0939   TCO2 24 06/17/2020 0939   ACIDBASEDEF 1.0 06/17/2020 0939   O2SAT 97.0 06/17/2020 0939     Coagulation Profile: Recent Labs  Lab 06/16/20 1420  INR 1.1    Cardiac Enzymes: No results for input(s): CKTOTAL, CKMB, CKMBINDEX, TROPONINI in the last 168 hours.  HbA1C: Hgb A1c MFr Bld  Date/Time Value Ref Range Status  06/17/2020 03:16 AM 5.6 4.8 - 5.6 % Final    Comment:    (NOTE) Pre diabetes:          5.7%-6.4%  Diabetes:              >6.4%  Glycemic control for   <7.0% adults with diabetes     CBG: Recent Labs  Lab 06/19/20 1553 06/19/20 1923 06/19/20 2321 06/20/20 0327  06/20/20 0715  GLUCAP 121* 130* 113* 117* 101*     The patient is critically ill with multiple organ systems failure and requires high complexity decision making for assessment and support, frequent evaluation and titration of therapies, application of advanced monitoring technologies and extensive interpretation of multiple databases. Critical Care Time devoted to patient care services described in this note independent of APP/resident  time is 31 minutes.    Cyril Mourning MD. Tonny Bollman. [facility] See Amion for pager  If no response to pager , please call [phone]  After 7:00 pm call Elink  [phone]   06/20/2020

## 2020-06-20 NOTE — Progress Notes (Signed)
Pharmacy Antibiotic Note  Denise Savage is a 60 y.o. female admitted on 06/15/2020 with possible PNA, fever. Pharmacy has been consulted for Unasyn dosing. SCr wnl.  Plan: Unasyn 3g IV q6h Monitor clinical progress, c/s, renal function F/u de-escalation plan/LOT Pharmacy will s/o consult and monitor peripherally   Height: 5\' 4"  (162.6 cm) Weight: 102.7 kg (226 lb 6.6 oz) IBW/kg (Calculated) : 54.7  Temp (24hrs), Avg:99.5 F (37.5 C), Min:98 F (36.7 C), Max:100.9 F (38.3 C)  Recent Labs  Lab 06/16/20 1420 06/16/20 2213 06/17/20 0316 06/18/20 0633 06/20/20 0358  WBC 7.9 9.2 10.7* 18.1* 17.4*  CREATININE 0.80 0.81 0.75 0.73 0.68    Estimated Creatinine Clearance: 87.2 mL/min (by C-G formula based on SCr of 0.68 mg/dL).    Allergies  Allergen Reactions  . Hydrocodone Nausea Only    08/18/20, PharmD, BCPS Please check AMION for all [facility] contact numbers Clinical Pharmacist 06/20/2020 9:46 AM

## 2020-06-20 NOTE — Progress Notes (Signed)
Subjective: The patient is intubated and in no apparent distress.  Objective: Vital signs in last 24 hours: Temp:  [98 F (36.7 C)-100.9 F (38.3 C)] 100.9 F (38.3 C) (01/08 0800) Pulse Rate:  [57-81] 68 (01/08 0508) Resp:  [13-39] 18 (01/08 0508) BP: (126-208)/(57-128) 143/69 (01/08 0508) SpO2:  [97 %-100 %] 98 % (01/08 0750) Arterial Line BP: (156-205)/(59-83) 161/67 (01/08 0400) FiO2 (%):  [40 %] 40 % (01/08 0750) Weight:  [102.7 kg] 102.7 kg (01/08 0500) Estimated body mass index is 38.86 kg/m as calculated from the following:   Height as of this encounter: 5\' 4"  (1.626 m).   Weight as of this encounter: 102.7 kg.   Intake/Output from previous day: 01/07 0701 - 01/08 0700 In: 2345.5 [I.V.:1720.5; NG/GT:225; IV Piggyback:400.1] Out: 3100 [Urine:2525; Drains:75; Blood:500] Intake/Output this shift: No intake/output data recorded.  Physical exam the patient's pupils are equal. She localizes on the left. She is right hemiplegic. Her wound is intact. Her drain is in place.  Lab Results: Recent Labs    06/18/20 0633 06/20/20 0358  WBC 18.1* 17.4*  HGB 12.6 10.8*  HCT 38.3 34.9*  PLT 247 212   BMET Recent Labs    06/18/20 0633 06/18/20 1105 06/19/20 0945 06/20/20 0358  NA 144   < > 148* 148*  K 3.5  --   --  3.1*  CL 111  --   --  111  CO2 24  --   --  26  GLUCOSE 141*  --   --  161*  BUN 18  --   --  21*  CREATININE 0.73  --   --  0.68  CALCIUM 10.8*  --   --  10.3   < > = values in this interval not displayed.    Studies/Results: CT HEAD WO CONTRAST  Result Date: 06/19/2020 CLINICAL DATA:  Subarachnoid hemorrhage post left hemicraniectomy EXAM: CT HEAD WITHOUT CONTRAST TECHNIQUE: Contiguous axial images were obtained from the base of the skull through the vertex without intravenous contrast. COMPARISON:  Earlier same day FINDINGS: Brain: Postoperative changes of interval decompression with placement of extra-axial drain. Extra-axial fluid, small volume  hemorrhage, and air are present. Large left MCA territory infarction is again identified. There is decreased mass effect with trace residual midline shift. Improved effacement of the left lateral and third ventricles. No hydrocephalus. Subarachnoid hemorrhage in the left greater than right cortical sulci similar to the prior study. No substantial change in layering hemorrhage within the right occipital horn. Vascular: Left MCA aneurysm coiling with associated streak artifact. No new findings. Skull: Interval left hemispheric craniectomy for decompression. Sinuses/Orbits: No acute finding. Other: None. IMPRESSION: New postoperative changes of left hemispheric craniectomy for decompression with placement of extra-axial drain. Substantial improvement in mass effect with trace residual midline shift. Evolving large left MCA territory infarction and residual subarachnoid hemorrhage. Electronically Signed   By: 08/17/2020 M.D.   On: 06/19/2020 16:35   CT HEAD WO CONTRAST  Result Date: 06/19/2020 CLINICAL DATA:  Follow-up for subarachnoid hemorrhage. EXAM: CT HEAD WITHOUT CONTRAST TECHNIQUE: Contiguous axial images were obtained from the base of the skull through the vertex without intravenous contrast. COMPARISON:  Prior exams, most recent from 06/18/2020 to 06/15/2020. FINDINGS: Brain: Involving left MCA territory infarct with associated subarachnoid hemorrhage. Appearance is similar to the previous day's study. No evidence of new hemorrhage. Dependent hemorrhage is seen in the lateral ventricles. Left lateral ventricle is mostly effaced as is the third ventricle. No hydrocephalus. Mass  effect and midline shift to the right may be slightly increased from the previous day's exam although the difference could be due to differences in positioning. Midline shift to the right is 9 mm, previous exam measuring 8 mm and 6 mm on the exam prior to that. No evidence of a new infarction. Vascular: Coil mass to the left of the  suprasellar cistern is stable. Skull: Normal. Negative for fracture or focal lesion. Sinuses/Orbits: No acute finding. Other: None. IMPRESSION: 1. Slight further increase in mass effect from the large left MCA distribution infarct is suggested, midline shift to the right now measuring 9 mm, 8 mm on the previous day's exam. 2. No evidence of new intracranial hemorrhage. Subarachnoid and intraventricular hemorrhage noted on the previous exam is without substantial change. No hydrocephalus. No new areas of infarction. Electronically Signed   By: Amie Portland M.D.   On: 06/19/2020 09:40   CT HEAD WO CONTRAST  Result Date: 06/18/2020 CLINICAL DATA:  Follow-up examination status post coil embolization of ruptured left MCA aneurysm with left MCA territory infarct. EXAM: CT HEAD WITHOUT CONTRAST TECHNIQUE: Contiguous axial images were obtained from the base of the skull through the vertex without intravenous contrast. COMPARISON:  Prior study from earlier same day as well as multiple previous exams. FINDINGS: Brain: Streak artifact from coiled left MCA aneurysm again seen. Scattered subarachnoid hemorrhage throughout the left greater than right cerebral hemispheres is minimally decreased from prior, consistent with partial resorption. Intraventricular blood seen layering within the occipital horn of the right lateral ventricle compatible with redistribution. No new hemorrhage. Continued interval evolution of large left MCA territory infarct, stable in size and distribution from prior. Continued sparing of the left basal ganglia. Associated regional mass effect has somewhat worsened in the interim with increased effacement of the left lateral ventricle. Left-to-right midline shift now measures up to 8 mm, previously 6 mm. No hydrocephalus or ventricular trapping. Basilar cisterns remain patent. No new large vessel territory infarct. No extra-axial fluid collection. Vascular: Coiled left MCA aneurysm. Skull: No new  finding. Sinuses/Orbits: Globes and orbital soft tissues remain within normal limits. Right maxillary sinus retention cyst. Paranasal sinuses and mastoid air cells are otherwise clear. Nasogastric tube in place. Other: None. IMPRESSION: 1. Continued interval evolution of left MCA territory infarct, stable in size and distribution from prior. Associated regional mass effect has worsened with increased left-to-right midline shift, now measuring 8 mm, previously 6 mm. No hydrocephalus or ventricular trapping. 2. Slight interval decrease in subarachnoid hemorrhage, consistent with redistribution. 3. No other new acute intracranial abnormality. Electronically Signed   By: Rise Mu M.D.   On: 06/18/2020 21:22   CT HEAD WO CONTRAST  Result Date: 06/18/2020 CLINICAL DATA:  Aneurysmal hemorrhage post coiling, left MCA infarct follow-up EXAM: CT HEAD WITHOUT CONTRAST TECHNIQUE: Contiguous axial images were obtained from the base of the skull through the vertex without intravenous contrast. COMPARISON:  Earlier same day FINDINGS: Brain: Large left MCA territory infarction is again identified. Mass effect is similar with rightward midline shift measuring 7 mm (previously 6 mm). There is no ventricle trapping or hydrocephalus. Left much greater than right sulcal subarachnoid hemorrhage is not substantially changed. There is similar layering hemorrhage in the right occipital horn. No new loss of gray-white differentiation. Vascular: Post coiling of left MCA aneurysm with associated streak artifact. Skull: Calvarium is unremarkable. Sinuses/Orbits: No acute finding. Other: None. IMPRESSION: Evolving large left MCA territory infarction with similar mass effect. No hydrocephalus. Similar subarachnoid hemorrhage. Electronically  Signed   By: Guadlupe Spanish M.D.   On: 06/18/2020 09:49   DG Chest Port 1 View  Result Date: 06/20/2020 CLINICAL DATA:  Evaluate pneumothorax EXAM: PORTABLE CHEST 1 VIEW COMPARISON:   Yesterday FINDINGS: Endotracheal tube with tip at the clavicular heads. The feeding tube at least reaches the stomach. Right IJ line with tip at the SVC. Hazy/indistinct opacity at the bases, likely atelectasis in this setting. No edema, effusion, or pneumothorax. Normal heart size. Presumed left-sided coronary stent. IMPRESSION: Stable hardware positioning and presumed atelectasis. Electronically Signed   By: Marnee Spring M.D.   On: 06/20/2020 07:38   DG Chest Port 1 View  Result Date: 06/19/2020 CLINICAL DATA:  Status post PICC line placement. EXAM: PORTABLE CHEST 1 VIEW COMPARISON:  June 18, 2020 FINDINGS: A new central line enters via a right IJ approach with the distal tip in the central SVC. The ETT is in good position. The feeding tube terminates below today's film. No pneumothorax. No other interval changes. IMPRESSION: A new right central line has been placed via a right IJ approach with the distal tip in the central SVC and no pneumothorax. No other changes. Electronically Signed   By: Gerome Sam III M.D   On: 06/19/2020 19:13   Overnight EEG with video  Result Date: 06/18/2020 Charlsie Quest, MD     06/19/2020  9:55 AM Patient Name: DAFINA SUK MRN: [id number] Epilepsy Attending: Charlsie Quest Referring Physician/Provider:  Dr. Cindra Presume Duration:  06/17/2020 1834 to  06/18/2020  1834  Patient history: 60 year old female with ruptured left MCA aneurysm status post coiling, continues to be encephalopathic. EEG to evaluate for seizures.  Level of alertness:  comatose  AEDs during EEG study: Keppra  Technical aspects: This EEG study was done with scalp electrodes positioned according to the 10-20 International system of electrode placement. Electrical activity was acquired at a sampling rate of 500Hz  and reviewed with a high frequency filter of 70Hz  and a low frequency filter of 1Hz . EEG data were recorded continuously and digitally stored.  Description: EEG showed continuous  generalized 3 to 5 Hz low amplitude theta-delta slowing in left hemisphere. There is 6-9 Hz theta and alpha activity in right hemisphere as well as intermittent rhythmic 2 to 3 Hz delta slowing. Spikes were noted in right parietal region. Hyperventilation and photic stimulation were not performed.    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 There is also cortical dysfunction in left hemisphere likely secondary to underlying structural abnormality. The intermittent rhythmic delta slow is in the ictal-interictal continuum. Additionally there is moderate to severe diffuse encephalopathy, nonspecific etiology. No definite seizures were seen throughout the recording.    EKG SITE RITE  Result Date: 06/18/2020 If Site Rite image not attached, placement could not be confirmed due to current cardiac rhythm.   Assessment/Plan: Postop day #1: The patient is stable neurologically. We will continue supportive care.  LOS: 4 days     Charlsie Quest 06/20/2020, 8:36 AM

## 2020-06-20 NOTE — Progress Notes (Signed)
AM K+ 3.1 with Creat 0.68 and GFR > 60. ELink CCM electrolyte protocol initiated.

## 2020-06-20 NOTE — Progress Notes (Signed)
LTM maint complete - no skin breakdown under: FP1 Fp2  P7 A1 F3

## 2020-06-21 LAB — BASIC METABOLIC PANEL
Anion gap: 9 (ref 5–15)
BUN: 18 mg/dL (ref 6–20)
CO2: 28 mmol/L (ref 22–32)
Calcium: 10.3 mg/dL (ref 8.9–10.3)
Chloride: 111 mmol/L (ref 98–111)
Creatinine, Ser: 0.62 mg/dL (ref 0.44–1.00)
GFR, Estimated: >60 mL/min (ref >60)
Glucose, Bld: 146 mg/dL — ABNORMAL HIGH (ref 70–99)
Potassium: 3.3 mmol/L — ABNORMAL LOW (ref 3.5–5.1)
Sodium: 148 mmol/L — ABNORMAL HIGH (ref 135–145)

## 2020-06-21 LAB — CBC
HCT: 31.3 % — ABNORMAL LOW (ref 36.0–46.0)
Hemoglobin: 10.1 g/dL — ABNORMAL LOW (ref 12.0–15.0)
MCH: 29.2 pg (ref 26.0–34.0)
MCHC: 32.3 g/dL (ref 30.0–36.0)
MCV: 90.5 fL (ref 80.0–100.0)
Platelets: 204 10*3/uL (ref 150–400)
RBC: 3.46 MIL/uL — ABNORMAL LOW (ref 3.87–5.11)
RDW: 14.9 % (ref 11.5–15.5)
WBC: 14.8 10*3/uL — ABNORMAL HIGH (ref 4.0–10.5)
nRBC: 0 % (ref 0.0–0.2)

## 2020-06-21 LAB — GLUCOSE, CAPILLARY
Glucose-Capillary: 112 mg/dL — ABNORMAL HIGH (ref 70–99)
Glucose-Capillary: 120 mg/dL — ABNORMAL HIGH (ref 70–99)
Glucose-Capillary: 115 mg/dL — ABNORMAL HIGH (ref 70–99)
Glucose-Capillary: 124 mg/dL — ABNORMAL HIGH (ref 70–99)
Glucose-Capillary: 117 mg/dL — ABNORMAL HIGH (ref 70–99)
Glucose-Capillary: 105 mg/dL — ABNORMAL HIGH (ref 70–99)

## 2020-06-21 LAB — PHOSPHORUS: Phosphorus: 2.1 mg/dL — ABNORMAL LOW (ref 2.5–4.6)

## 2020-06-21 LAB — MAGNESIUM: Magnesium: 1.8 mg/dL (ref 1.7–2.4)

## 2020-06-21 MED ORDER — LEVETIRACETAM 100 MG/ML PO SOLN
1500.0000 mg | Freq: Two times a day (BID) | ORAL | Status: DC
  Administered 2020-06-21 – 2020-07-03 (×24): 1500 mg
  Filled 2020-06-21 (×24): qty 15

## 2020-06-21 MED ORDER — POTASSIUM CHLORIDE 10 MEQ/50ML IV SOLN
10.0000 meq | INTRAVENOUS | Status: AC
  Administered 2020-06-21 (×2): 10 meq via INTRAVENOUS
  Filled 2020-06-21 (×2): qty 50

## 2020-06-21 MED ORDER — POTASSIUM PHOSPHATES 15 MMOLE/5ML IV SOLN
15.0000 mmol | Freq: Once | INTRAVENOUS | Status: AC
  Administered 2020-06-21: 15 mmol via INTRAVENOUS
  Filled 2020-06-21: qty 5

## 2020-06-21 MED ORDER — MAGNESIUM SULFATE 2 GM/50ML IV SOLN
2.0000 g | Freq: Once | INTRAVENOUS | Status: AC
  Administered 2020-06-21: 2 g via INTRAVENOUS
  Filled 2020-06-21: qty 50

## 2020-06-21 MED ORDER — POTASSIUM CHLORIDE 20 MEQ PO PACK
20.0000 meq | PACK | Freq: Once | ORAL | Status: AC
  Administered 2020-06-21: 20 meq
  Filled 2020-06-21: qty 1

## 2020-06-21 NOTE — Progress Notes (Signed)
NAME:  Denise Savage, MRN:  [id number], DOB:  10-15-1960, LOS: 5 ADMISSION DATE:  06/15/2020, CONSULTATION DATE:  06/16/20 REFERRING MD:  Dr. Maurice Small, CHIEF COMPLAINT:  VDRF   Brief History:  60 year old female presented headache x 5 days found to have left sylvian fissure subarachnoid hemorrhage with a left MCA bifurcation aneurysm.  Transferred to [facility] for further care and went for diagnostic angiography with coiling, complicated by intraoperative rupture and subsequent severe vasospasm leading to malignant left MCA stroke.  She returned to the ICU intubated.  Past Medical History:   has a past medical history of Abdominal hernia.  Significant Hospital Events:  1/3 present to [facility] emergency department 1/4 transferred to [facility], NSGY admitting/ taken to neuro IR for coiling.  Remained on vent postop 1/5  more lethargic and unresponsive, stat head CT was done which showed progressive left MCA infarct with brain edema and 6 mm midline shift.  Persistent nonprogressive subarachnoid hemorrhage. 1/6 worsening edema on CT, 3% saline continued 1/7 Keppra increased for subclinical seizures on LTM EEG 1/8 Low-grade febrile 100.9  Consults:  Neuro IR PCCM  Procedures:  1/4 diagnostic angiogram with coiling 1/7 left hemicraniectomy  Significant Diagnostic Tests:  1/3 CT angiogram head neck > small volume acute subarachnoid hemorrhage centered at the base of the left sylvian fissure.  6 x 6 x 9 mm irregular aneurysm arising from the left MCA bifurcation.  1/5 CTH >> 1. Diffused and partially reabsorbed subarachnoid hemorrhage since yesterday. 2. 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  1/6 CTH: Progressive swelling of the left MCA infarct with new midline shift measuring 6 mm. Nonprogressive subarachnoid hemorrhage. No evidence of new territory infarct 1/6 CTH -increasing midline shift 8 mm  1/6 LTM EEG evidence of epileptogenicity arising from right parietal  region , no seizures 1/8 LTM EEG >> evidence ofcortical dysfunctionand epileptogenicity in left frontal region consistent with underlying craniotomy andstructural abnormality. After propofol was stopped, eeg showed periodic epileptiform discharges at 1-1.5hz  In left frontal region which were intermittent and rhythmic which is on the ictal-interictal continuum , worse compared to 1/7  1/9 LTM EEG similar to 1/8   Micro Data:  1/3 SARS/ flu >>neg 1/4 MRSA  >> neg 1/8 resp >> ng  Antimicrobials:   n/a  Interim History / Subjective:   Remains critically ill, intubated Off propofol this morning On LTM EEG Afebrile Good urine output  Objective   Blood pressure 137/61, pulse 75, temperature 98.3 F (36.8 C), temperature source Axillary, resp. rate 18, height 5\' 4"  (1.626 m), weight 102.7 kg, SpO2 100 %.    Vent Mode: PRVC FiO2 (%):  [30 %-40 %] 30 % Set Rate:  [16 bmp] 16 bmp Vt Set:  [430 mL] 430 mL PEEP:  [5 cmH20] 5 cmH20 Plateau Pressure:  [14 cmH20-17 cmH20] 15 cmH20   Intake/Output Summary (Last 24 hours) at 06/21/2020 1102 Last data filed at 06/21/2020 0500 Gross per 24 hour  Intake 491.95 ml  Output 2170 ml  Net -1678.05 ml   Filed Weights   06/18/20 0500 06/19/20 0500 06/20/20 0500  Weight: 100 kg 101 kg 102.7 kg    Examination: Gen:      elderly woman, no distress , intubated HEENT:  EOMI, sclera anicteric, mild pallor, left craniectomy incision appears clean, LTM EEG ongoing, left eyelid edema Neck:     No JVD; no thyromegaly Lungs:   Bilateral ventilated breath sounds, minimal secretions, no accessory muscle use CV:  Regular rate and rhythm; no murmurs Abd:      + bowel sounds; soft, non-tender; no palpable masses, no distension Ext:    No edema; adequate peripheral perfusion Skin:      Warm and dry; no rash Neuro: Unresponsive, purposeful on left, right hemiplegia , right pupil reactive, unable to examine left   Labs show mild hypernatremia 148,  hypokalemia, mild hypokalemia and hypophosphatemia, decreasing leukocytosis  Chest x-ray 1/8 independently reviewed, mild bibasilar atelectasis, ET tube in position    Resolved Hospital Problem list     Assessment & Plan:   Aneurysmal subarachnoid hemorrhage due to ruptured left MCA aneurysm s/p coiling 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 and midline shift Acute encephalopathy due to subarachnoid hemorrhage and acute stroke -Midline shift increased to 8 mm >> status post left hemicraniectomy -Evidence of seizures on LTM EEG  -continue Nimotop and NS at 50 cc an hour -Continue Keppra for seizures, defer anticonvulsants to neuro physicians -LTM EEG per neuro sx  Acute hypoxic respiratory failure -Tolerates pressure support weaning but clearly not a candidate for extubation -Daily W UA and SBT's   Hypokalemia and hypophosphatemia will be repleted  Fever with Leukocytosis -added empiric Unasyn , if cultures negative can stop x 5 days  Best practice (evaluated daily)  Diet: NPO, tube feeds Pain/Anxiety/Delirium protocol (if indicated): Propofol + As needed fentanyl with RASS goal 0/-1 VAP protocol (if indicated): Per protocol DVT prophylaxis: SCDs only  GI prophylaxis: PPI Glucose control: trend, with goal FS 140-180 Mobility: Bedrest Disposition: ICU  Goals of Care:  Last date of multidisciplinary goals of care discussion: 1/5 Family and staff present: daughter , Per neurosurgery Summary of discussion: Continue full aggressive care Follow up goals of care discussion due: 1/12 , updated daughter at bedside 1/9 Code Status: Full   Labs   CBC: Recent Labs  Lab 06/16/20 2213 06/17/20 0316 06/17/20 0939 06/18/20 0633 06/20/20 0358 06/21/20 0445  WBC 9.2 10.7*  --  18.1* 17.4* 14.8*  NEUTROABS  --   --   --  16.1*  --   --   HGB 12.8 12.6 13.6 12.6 10.8* 10.1*  HCT 37.7 39.2 40.0 38.3 34.9* 31.3*  MCV 85.9 86.5  --  86.5 89.9 90.5  PLT 232 224   --  247 212 204    Basic Metabolic Panel: Recent Labs  Lab 06/16/20 2213 06/17/20 0316 06/17/20 0820 06/17/20 0939 06/17/20 0954 06/17/20 2121 06/18/20 0633 06/18/20 1105 06/18/20 1708 06/18/20 2131 06/19/20 0607 06/19/20 0945 06/20/20 0358 06/21/20 0445  NA 135 135  --  137   < > 140 144   < > 145 145 147* 148* 148* 148*  K 3.4* 3.6  --  3.4*  --   --  3.5  --   --   --   --   --  3.1* 3.3*  CL 104 102  --   --   --   --  111  --   --   --   --   --  111 111  CO2 23 23  --   --   --   --  24  --   --   --   --   --  26 28  GLUCOSE 156* 160*  --   --   --   --  141*  --   --   --   --   --  161* 146*  BUN 11 13  --   --   --   --  18  --   --   --   --   --  21* 18  CREATININE 0.81 0.75  --   --   --   --  0.73  --   --   --   --   --  0.68 0.62  CALCIUM 10.1 10.3  --   --   --   --  10.8*  --   --   --   --   --  10.3 10.3  MG  --   --    < >  --   --  2.2 2.2  --  1.9  --  1.8  --  1.8 1.8  PHOS  --   --   --   --    < > 2.1* 1.3*  --  2.0*  --   --   --  2.9 2.1*   < > = values in this interval not displayed.   GFR: Estimated Creatinine Clearance: 87.2 mL/min (by C-G formula based on SCr of 0.62 mg/dL). Recent Labs  Lab 06/17/20 0316 06/18/20 0633 06/20/20 0358 06/21/20 0445  WBC 10.7* 18.1* 17.4* 14.8*    Liver Function Tests: Recent Labs  Lab 06/18/20 0633  ALBUMIN 3.1*   No results for input(s): LIPASE, AMYLASE in the last 168 hours. No results for input(s): AMMONIA in the last 168 hours.  ABG    Component Value Date/Time   PHART 7.416 06/17/2020 0939   PCO2ART 35.4 06/17/2020 0939   PO2ART 85 06/17/2020 0939   HCO3 22.8 06/17/2020 0939   TCO2 24 06/17/2020 0939   ACIDBASEDEF 1.0 06/17/2020 0939   O2SAT 97.0 06/17/2020 0939      CBG: Recent Labs  Lab 06/20/20 1524 06/20/20 1931 06/20/20 2313 06/21/20 0308 06/21/20 0725  GLUCAP 114* 131* 111* 112* 120*     The patient is critically ill with multiple organ systems failure and requires  high complexity decision making for assessment and support, frequent evaluation and titration of therapies, application of advanced monitoring technologies and extensive interpretation of multiple databases. Critical Care Time devoted to patient care services described in this note independent of APP/resident  time is 31 minutes.  Cyril Mourning MD. Tonny Bollman. [facility] See Amion for pager  If no response to pager , please call [phone]  After 7:00 pm call Elink  [phone]   06/21/2020

## 2020-06-21 NOTE — Progress Notes (Signed)
LTM maint complete - no skin breakdown under:  Fp1 Fp2 F3 F7 Event button tested atrium monitored

## 2020-06-21 NOTE — Progress Notes (Signed)
Subjective: The patient is in no apparent distress.  Objective: Vital signs in last 24 hours: Temp:  [98.3 F (36.8 C)-101.1 F (38.4 C)] 98.3 F (36.8 C) (01/09 0800) Pulse Rate:  [65-95] 79 (01/09 0800) Resp:  [15-23] 17 (01/09 0800) BP: (115-166)/(46-89) 137/61 (01/09 0800) SpO2:  [99 %-100 %] 100 % (01/09 0800) FiO2 (%):  [40 %] 40 % (01/09 0800) Estimated body mass index is 38.86 kg/m as calculated from the following:   Height as of this encounter: 5\' 4"  (1.626 m).   Weight as of this encounter: 102.7 kg.   Intake/Output from previous day: 01/08 0701 - 01/09 0700 In: 492 [I.V.:222.1; IV Piggyback:269.9] Out: 2170 [Urine:2150; Drains:20] Intake/Output this shift: No intake/output data recorded.  Physical exam the patient is intubated.  She is purposeful on the left.  She is right hemiplegic.  Her pupils are equal.  I removed her Jackson-Pratt drain.  Lab Results: Recent Labs    06/20/20 0358 06/21/20 0445  WBC 17.4* 14.8*  HGB 10.8* 10.1*  HCT 34.9* 31.3*  PLT 212 204   BMET Recent Labs    06/20/20 0358 06/21/20 0445  NA 148* 148*  K 3.1* 3.3*  CL 111 111  CO2 26 28  GLUCOSE 161* 146*  BUN 21* 18  CREATININE 0.68 0.62  CALCIUM 10.3 10.3    Studies/Results: CT HEAD WO CONTRAST  Result Date: 06/19/2020 CLINICAL DATA:  Subarachnoid hemorrhage post left hemicraniectomy EXAM: CT HEAD WITHOUT CONTRAST TECHNIQUE: Contiguous axial images were obtained from the base of the skull through the vertex without intravenous contrast. COMPARISON:  Earlier same day FINDINGS: Brain: Postoperative changes of interval decompression with placement of extra-axial drain. Extra-axial fluid, small volume hemorrhage, and air are present. Large left MCA territory infarction is again identified. There is decreased mass effect with trace residual midline shift. Improved effacement of the left lateral and third ventricles. No hydrocephalus. Subarachnoid hemorrhage in the left greater  than right cortical sulci similar to the prior study. No substantial change in layering hemorrhage within the right occipital horn. Vascular: Left MCA aneurysm coiling with associated streak artifact. No new findings. Skull: Interval left hemispheric craniectomy for decompression. Sinuses/Orbits: No acute finding. Other: None. IMPRESSION: New postoperative changes of left hemispheric craniectomy for decompression with placement of extra-axial drain. Substantial improvement in mass effect with trace residual midline shift. Evolving large left MCA territory infarction and residual subarachnoid hemorrhage. Electronically Signed   By: 08/17/2020 M.D.   On: 06/19/2020 16:35   CT HEAD WO CONTRAST  Result Date: 06/19/2020 CLINICAL DATA:  Follow-up for subarachnoid hemorrhage. EXAM: CT HEAD WITHOUT CONTRAST TECHNIQUE: Contiguous axial images were obtained from the base of the skull through the vertex without intravenous contrast. COMPARISON:  Prior exams, most recent from 06/18/2020 to 06/15/2020. FINDINGS: Brain: Involving left MCA territory infarct with associated subarachnoid hemorrhage. Appearance is similar to the previous day's study. No evidence of new hemorrhage. Dependent hemorrhage is seen in the lateral ventricles. Left lateral ventricle is mostly effaced as is the third ventricle. No hydrocephalus. Mass effect and midline shift to the right may be slightly increased from the previous day's exam although the difference could be due to differences in positioning. Midline shift to the right is 9 mm, previous exam measuring 8 mm and 6 mm on the exam prior to that. No evidence of a new infarction. Vascular: Coil mass to the left of the suprasellar cistern is stable. Skull: Normal. Negative for fracture or focal lesion. Sinuses/Orbits: No acute  finding. Other: None. IMPRESSION: 1. Slight further increase in mass effect from the large left MCA distribution infarct is suggested, midline shift to the right now  measuring 9 mm, 8 mm on the previous day's exam. 2. No evidence of new intracranial hemorrhage. Subarachnoid and intraventricular hemorrhage noted on the previous exam is without substantial change. No hydrocephalus. No new areas of infarction. Electronically Signed   By: Amie Portland M.D.   On: 06/19/2020 09:40   DG Chest Port 1 View  Result Date: 06/20/2020 CLINICAL DATA:  Evaluate pneumothorax EXAM: PORTABLE CHEST 1 VIEW COMPARISON:  Yesterday FINDINGS: Endotracheal tube with tip at the clavicular heads. The feeding tube at least reaches the stomach. Right IJ line with tip at the SVC. Hazy/indistinct opacity at the bases, likely atelectasis in this setting. No edema, effusion, or pneumothorax. Normal heart size. Presumed left-sided coronary stent. IMPRESSION: Stable hardware positioning and presumed atelectasis. Electronically Signed   By: Marnee Spring M.D.   On: 06/20/2020 07:38   DG Chest Port 1 View  Result Date: 06/19/2020 CLINICAL DATA:  Status post PICC line placement. EXAM: PORTABLE CHEST 1 VIEW COMPARISON:  June 18, 2020 FINDINGS: A new central line enters via a right IJ approach with the distal tip in the central SVC. The ETT is in good position. The feeding tube terminates below today's film. No pneumothorax. No other interval changes. IMPRESSION: A new right central line has been placed via a right IJ approach with the distal tip in the central SVC and no pneumothorax. No other changes. Electronically Signed   By: Gerome Sam III M.D   On: 06/19/2020 19:13    Assessment/Plan: Left MCA CVA: She is stable status post craniectomy.  LOS: 5 days     Cristi Loron 06/21/2020, 8:55 AM

## 2020-06-21 NOTE — Progress Notes (Signed)
Pharmacy Electrolyte Replacement  Recent Labs:  Recent Labs    06/21/20 0445  K 3.3*  MG 1.8  PHOS 2.1*  CREATININE 0.62    Low Critical Values (K </= 2.5, Phos </= 1, Mg </= 1) Present: None  MD Contacted: n/a - no critical values noted  Plan:  KPhos 15 mmol IV x 1 KCl 20 mEq PT x 1 + K runs x 2 Mag sulfate 2g IV x 1 Repeat labs in AM per protocol   Leia Alf, PharmD, BCPS Please check AMION for all [facility] contact numbers Clinical Pharmacist 06/21/2020 11:08 AM

## 2020-06-21 NOTE — Procedures (Addendum)
Patient Name:Denise Savage BVA:[id number] Epilepsy Attending:Maurice Fotheringham [name] Referring Physician/Provider:Dr. Cindra Presume Duration: 06/21/2020 0100 to 06/22/2020 0100  Patient history:60 year old female with ruptured left MCA aneurysm status post coiling,continues to be encephalopathic.EEG to evaluate for seizures.  Level of alertness:comatose  AEDs during EEG study:Keppra  Technical aspects: This EEG study was done with scalp electrodes positioned according to the 10-20 International system of electrode placement. Electrical activity was acquired at a sampling rate of 500Hz  and reviewed with a high frequency filter of 70Hz  and a low frequency filter of 1Hz . EEG data were recorded continuously and digitally stored.   Description:EEG showed continuous generalized 2-3Hz  delta slowing with sharply contoured waves in left frontal region consistent breech artifact. Periodic epileptiform discharges were seen in left frontal region at 1-1.5hz  which were intermittent and rhythmic.   ABNORMALITY -Periodic epileptiform discharges, left frontal region -Continuous slow, generalized and lateralizedlefthemisphere - Breech artifact, left frontal region  IMPRESSION: This study showedevidence ofcortical dysfunctionand epileptogenicity in left frontal region consistent with underlying craniotomy andstructural abnormality. EEG showed periodic epileptiform discharges at 1-1.5hz  in left frontal region which were intermittent and rhythmic which is on the ictal-interictal continuum. Additionally there is severe diffuse encephalopathy, nonspecific etiology.   EEG appears to be similar  to yesterday.  Etoile Looman 

## 2020-06-22 ENCOUNTER — Inpatient Hospital Stay (HOSPITAL_COMMUNITY): Payer: Medicaid Other

## 2020-06-22 ENCOUNTER — Encounter (HOSPITAL_COMMUNITY): Payer: Self-pay | Admitting: Neurological Surgery

## 2020-06-22 DIAGNOSIS — I428 Other cardiomyopathies: Secondary | ICD-10-CM

## 2020-06-22 LAB — BASIC METABOLIC PANEL
Anion gap: 10 (ref 5–15)
BUN: 21 mg/dL — ABNORMAL HIGH (ref 6–20)
CO2: 28 mmol/L (ref 22–32)
Calcium: 9.9 mg/dL (ref 8.9–10.3)
Chloride: 109 mmol/L (ref 98–111)
Creatinine, Ser: 0.83 mg/dL (ref 0.44–1.00)
GFR, Estimated: >60 mL/min (ref >60)
Glucose, Bld: 129 mg/dL — ABNORMAL HIGH (ref 70–99)
Potassium: 3.3 mmol/L — ABNORMAL LOW (ref 3.5–5.1)
Sodium: 147 mmol/L — ABNORMAL HIGH (ref 135–145)

## 2020-06-22 LAB — GLUCOSE, CAPILLARY
Glucose-Capillary: 104 mg/dL — ABNORMAL HIGH (ref 70–99)
Glucose-Capillary: 124 mg/dL — ABNORMAL HIGH (ref 70–99)
Glucose-Capillary: 113 mg/dL — ABNORMAL HIGH (ref 70–99)
Glucose-Capillary: 115 mg/dL — ABNORMAL HIGH (ref 70–99)
Glucose-Capillary: 98 mg/dL (ref 70–99)
Glucose-Capillary: 130 mg/dL — ABNORMAL HIGH (ref 70–99)

## 2020-06-22 LAB — CBC
HCT: 28.4 % — ABNORMAL LOW (ref 36.0–46.0)
Hemoglobin: 9.2 g/dL — ABNORMAL LOW (ref 12.0–15.0)
MCH: 29.2 pg (ref 26.0–34.0)
MCHC: 32.4 g/dL (ref 30.0–36.0)
MCV: 90.2 fL (ref 80.0–100.0)
Platelets: 208 10*3/uL (ref 150–400)
RBC: 3.15 MIL/uL — ABNORMAL LOW (ref 3.87–5.11)
RDW: 14.8 % (ref 11.5–15.5)
WBC: 14.3 10*3/uL — ABNORMAL HIGH (ref 4.0–10.5)
nRBC: 0 % (ref 0.0–0.2)

## 2020-06-22 LAB — ECHOCARDIOGRAM COMPLETE
Area-P 1/2: 3.17 cm2
Height: 64 in
S' Lateral: 2.9 cm
Weight: 3622.6 oz

## 2020-06-22 LAB — PHOSPHORUS: Phosphorus: 2.6 mg/dL (ref 2.5–4.6)

## 2020-06-22 LAB — MAGNESIUM: Magnesium: 2 mg/dL (ref 1.7–2.4)

## 2020-06-22 MED ORDER — VALPROATE SODIUM 100 MG/ML IV SOLN
500.0000 mg | Freq: Three times a day (TID) | INTRAVENOUS | Status: DC
  Administered 2020-06-22 – 2020-06-23 (×3): 500 mg via INTRAVENOUS
  Filled 2020-06-22 (×5): qty 5

## 2020-06-22 MED ORDER — NIMODIPINE 6 MG/ML PO SOLN
30.0000 mg | ORAL | Status: DC
  Administered 2020-06-22 – 2020-07-01 (×109): 30 mg
  Filled 2020-06-22: qty 5
  Filled 2020-06-22 (×72): qty 10

## 2020-06-22 MED ORDER — SODIUM CHLORIDE 0.9 % IV BOLUS
1000.0000 mL | Freq: Once | INTRAVENOUS | Status: AC
  Administered 2020-06-22: 1000 mL via INTRAVENOUS

## 2020-06-22 MED ORDER — POTASSIUM CHLORIDE 20 MEQ PO PACK
40.0000 meq | PACK | Freq: Once | ORAL | Status: AC
  Administered 2020-06-22: 40 meq
  Filled 2020-06-22: qty 2

## 2020-06-22 MED ORDER — PHENYLEPHRINE HCL-NACL 10-0.9 MG/250ML-% IV SOLN
INTRAVENOUS | Status: AC
  Filled 2020-06-22: qty 250

## 2020-06-22 MED ORDER — PHENYLEPHRINE HCL-NACL 10-0.9 MG/250ML-% IV SOLN
0.0000 ug/min | INTRAVENOUS | Status: DC
  Administered 2020-06-22: 110 ug/min via INTRAVENOUS
  Administered 2020-06-22: 20 ug/min via INTRAVENOUS
  Administered 2020-06-22: 120 ug/min via INTRAVENOUS
  Administered 2020-06-22: 110 ug/min via INTRAVENOUS
  Administered 2020-06-22: 140 ug/min via INTRAVENOUS
  Administered 2020-06-22: 160 ug/min via INTRAVENOUS
  Administered 2020-06-22: 140 ug/min via INTRAVENOUS
  Administered 2020-06-22: 160 ug/min via INTRAVENOUS
  Administered 2020-06-22: 140 ug/min via INTRAVENOUS
  Administered 2020-06-22 – 2020-06-23 (×3): 160 ug/min via INTRAVENOUS
  Administered 2020-06-23: 150 ug/min via INTRAVENOUS
  Administered 2020-06-23 (×4): 160 ug/min via INTRAVENOUS
  Filled 2020-06-22 (×2): qty 250
  Filled 2020-06-22: qty 750
  Filled 2020-06-22: qty 500
  Filled 2020-06-22: qty 250
  Filled 2020-06-22: qty 1000
  Filled 2020-06-22: qty 500
  Filled 2020-06-22: qty 250
  Filled 2020-06-22: qty 500

## 2020-06-22 NOTE — Progress Notes (Signed)
  Echocardiogram 2D Echocardiogram has been performed.  Delcie Roch 06/22/2020, 4:03 PM

## 2020-06-22 NOTE — Progress Notes (Signed)
LTM maint complete - no skin breakdown under:  A2, Cz, Fp1, and Fp2

## 2020-06-22 NOTE — Progress Notes (Signed)
Will keep central line in for Phenylephrine and keep foley for now to watch urine output due to low blood pressures per Dr. Denese Killings.

## 2020-06-22 NOTE — Progress Notes (Signed)
Spoke to [employer], [place] regarding low blood pressures, order to change nimodipine from 60 mg Q4 to 30 mg Q2.

## 2020-06-22 NOTE — Progress Notes (Signed)
  NEUROSURGERY PROGRESS NOTE   No issues overnight.   EXAM:  BP (!) 99/48   Pulse 73   Temp 100.1 F (37.8 C) (Axillary)   Resp (!) 21   Ht 5\' 4"  (1.626 m)   Wt 102.7 kg   SpO2 100%   BMI 38.86 kg/m   Intubated Pupils 2-41mm reactive bilaterally Left gaze preference, does not cross midline Not follow commands this am Purposeful LUE/LLE.  No movement RUE/RLE Crani site: c/d/i abd incision: c/d/i  IMPRESSION/PLAN 60 y.o. female POD5 coiling ruptured LMCA complicated by intraop rupture, left MCA infarct POD3 left hemicraniectomy.  Subclinical seizures - continue supportive care, nimotop  - keppra 1500mg  BID - appreciate CCM management

## 2020-06-22 NOTE — Procedures (Signed)
Name:Denise Savage OZH:[id number] Epilepsy Attending:Reo Portela [name] Referring Physician/Provider:Dr. Cindra Presume Duration:06/22/2020 0100 to 1/11/20220100  Patient history:60 year old female with ruptured left MCA aneurysm status post coiling,continues to be encephalopathic.EEG to evaluate for seizures.  Level of alertness:comatose  AEDs during EEG study:Keppra  Technical aspects: This EEG study was done with scalp electrodes positioned according to the 10-20 International system of electrode placement. Electrical activity was acquired at a sampling rate of 500Hz  and reviewed with a high frequency filter of 70Hz  and a low frequency filter of 1Hz . EEG data were recorded continuously and digitally stored.   Description:EEG showed continuous generalized 3-5Hz  theta- delta slowing with sharply contoured waves in left frontal region consistent breech artifact.Periodic epileptiform discharges were seen in left frontal region at 1-1.5hz  which were intermittent and rhythmic.  ABNORMALITY -Periodic epileptiform discharges, left frontal region -Continuous slow, generalized and lateralizedlefthemisphere - Breech artifact, left frontal region  IMPRESSION: This study showedevidence ofcortical dysfunctionand epileptogenicity in left frontal region consistent with underlying craniotomy andstructural abnormality.EEG showed periodic epileptiform discharges at 1-1.5hz  in left frontal region which were intermittent and rhythmic which is on the ictal-interictal continuum.Additionally there is severe diffuse encephalopathy, nonspecific etiology.  EEG appears to be similar  to yesterday.  [name] [name]

## 2020-06-22 NOTE — Progress Notes (Signed)
NAME:  Denise Savage, MRN:  [id number], DOB:  06/09/1961, LOS: 6 ADMISSION DATE:  06/15/2020, CONSULTATION DATE:  06/16/20 REFERRING MD:  Dr. Maurice Small, CHIEF COMPLAINT:  VDRF   Brief History:  60 year old female presented headache x 5 days found to have left sylvian fissure subarachnoid hemorrhage with a left MCA bifurcation aneurysm.  Transferred to [facility] for further care and went for diagnostic angiography with coiling, complicated by intraoperative rupture and subsequent severe vasospasm leading to malignant left MCA stroke.  She returned to the ICU intubated.  Past Medical History:   has a past medical history of Abdominal hernia.  Significant Hospital Events:  1/3 present to [facility] emergency department 1/4 transferred to [facility], NSGY admitting/ taken to neuro IR for coiling.  Remained on vent postop 1/5  more lethargic and unresponsive, stat head CT was done which showed progressive left MCA infarct with brain edema and 6 mm midline shift.  Persistent nonprogressive subarachnoid hemorrhage. 1/6 worsening edema on CT, 3% saline continued 1/7 Keppra increased for subclinical seizures on LTM EEG 1/8 Low-grade febrile 100.9  Consults:  Neuro IR PCCM  Procedures:  1/4 diagnostic angiogram with coiling 1/7 left hemicraniectomy 1/7 R IJ central line  Significant Diagnostic Tests:  1/3 CT angiogram head neck > small volume acute subarachnoid hemorrhage centered at the base of the left sylvian fissure.  6 x 6 x 9 mm irregular aneurysm arising from the left MCA bifurcation.  1/5 CTH >> 1. Diffused and partially reabsorbed subarachnoid hemorrhage since yesterday. 2. 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  1/6 CTH: Progressive swelling of the left MCA infarct with new midline shift measuring 6 mm. Nonprogressive subarachnoid hemorrhage. No evidence of new territory infarct 1/6 CTH -increasing midline shift 8 mm  1/6 LTM EEG evidence of epileptogenicity arising  from right parietal region , no seizures 1/8 LTM EEG >> evidence ofcortical dysfunctionand epileptogenicity in left frontal region consistent with underlying craniotomy andstructural abnormality. After propofol was stopped, eeg showed periodic epileptiform discharges at 1-1.5hz  In left frontal region which were intermittent and rhythmic which is on the ictal-interictal continuum , worse compared to 1/7  1/9 LTM EEG similar to 1/8   Micro Data:  1/3 SARS/ flu >>neg 1/4 MRSA  >> neg 1/8 resp >> ng  Antimicrobials:  Unasyn 1/8>>  Interim History / Subjective:   Remains intubated on minimal vent setting Pressures on the lower side this morning--nimodipine decreased  Objective   Blood pressure (!) 99/48, pulse 73, temperature 100.1 F (37.8 C), temperature source Axillary, resp. rate (!) 21, height 5\' 4"  (1.626 m), weight 102.7 kg, SpO2 100 %.    Vent Mode: PSV FiO2 (%):  [30 %-40 %] 30 % Set Rate:  [16 bmp] 16 bmp Vt Set:  [430 mL] 430 mL PEEP:  [5 cmH20] 5 cmH20 Pressure Support:  [5 cmH20-8 cmH20] 5 cmH20 Plateau Pressure:  [14 cmH20-16 cmH20] 14 cmH20   Intake/Output Summary (Last 24 hours) at 06/22/2020 0800 Last data filed at 06/22/2020 0650 Gross per 24 hour  Intake 4482.02 ml  Output 2800 ml  Net 1682.02 ml   Filed Weights   06/18/20 0500 06/19/20 0500 06/20/20 0500  Weight: 100 kg 101 kg 102.7 kg    Examination: Gen:      Critically ill appearing female HEENT:  Surgical staples present on left parietal region, ETT, LTM Neck:     No JVD Lungs:   On minimal vent settings. Bilateral ventilated breath sounds, minimal secretions  CV:         RRR, no LE edema Abd:      + bowel sounds; soft, non-tender; no palpable masses, no distension Ext:    No edema; adequate peripheral perfusion Skin:      Warm and dry; no rash Neuro: not on sedation. Unresponsive. Does not awaken to sternal rub. Left gaze deviation present. PERRL. Gag reflex intact. Does not move right upper  or lower extremties to painful stimulation. Cortical posturing to painful stimuli on the left.  Resolved Hospital Problem list     Assessment & Plan:   Aneurysmal subarachnoid hemorrhage due to ruptured left MCA aneurysm s/p coiling 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 and midline shift Acute encephalopathy due to subarachnoid hemorrhage and acute stroke -Midline shift increased to 8 mm >> status post left hemicraniectomy -Evidence of seizures on LTM EEG -no significant changes on neuro exam despite being off sedation since yesterday. P: -management per neurology -nimlodipine decreased in the setting of soft pressures -Continue Keppra for seizures, defer anticonvulsants to neuro physicians -LTM EEG per neuro sx -maintain neuroprotective meassures  Acute hypoxic respiratory failure requiring mechanical ventilation secondary to above. On minimal vent settings and did well on pressure support yesterday. Mental status precluding extubation. P: -Daily WUA and SBT's -VAP bundle  Aspiration pneumonia--Infiltrate in the retrocardiac/right infrahilar region still present on CXR this morning. Afebrile overnight. Leukocytosis stable.  NGTD on 1/8 blood cultures or respiratory culture P: -continue unasyn to complete 5d course -follow blood cultures -prn tylenol for fever  Hypokalemia--repleting  Acute normocytic anemia. No obvious source of blood loss at this time. Continue to follow with daily CBCs.  Stress induced hyperglycemia. CBGs at goal. Continue SSI q4h  Best practice (evaluated daily)  Diet: NPO, tube feeds Pain/Anxiety/Delirium protocol (if indicated): prn fentanyl VAP protocol (if indicated): Per protocol DVT prophylaxis: SCDs only  GI prophylaxis: PPI Glucose control: trend, with goal FS 140-180 Mobility: Bedrest Disposition: ICU  Goals of Care:  Last date of multidisciplinary goals of care discussion: 1/5 Family and staff present: daughter , Per  neurosurgery Summary of discussion: Continue full aggressive care Follow up goals of care discussion due: 1/12 , updated daughter at bedside 1/9 Code Status: Full   Labs   CBC: Recent Labs  Lab 06/17/20 0316 06/17/20 0939 06/18/20 0633 06/20/20 0358 06/21/20 0445 06/22/20 0550  WBC 10.7*  --  18.1* 17.4* 14.8* 14.3*  NEUTROABS  --   --  16.1*  --   --   --   HGB 12.6 13.6 12.6 10.8* 10.1* 9.2*  HCT 39.2 40.0 38.3 34.9* 31.3* 28.4*  MCV 86.5  --  86.5 89.9 90.5 90.2  PLT 224  --  247 212 204 208    Basic Metabolic Panel: Recent Labs  Lab 06/17/20 0316 06/17/20 0820 06/17/20 0939 06/17/20 0954 06/18/20 0633 06/18/20 1105 06/18/20 1708 06/18/20 2131 06/19/20 0607 06/19/20 0945 06/20/20 0358 06/21/20 0445 06/22/20 0550  NA 135  --  137   < > 144   < > 145   < > 147* 148* 148* 148* 147*  K 3.6  --  3.4*  --  3.5  --   --   --   --   --  3.1* 3.3* 3.3*  CL 102  --   --   --  111  --   --   --   --   --  111 111 109  CO2 23  --   --   --  24  --   --   --   --   --  26 28 28   GLUCOSE 160*  --   --   --  141*  --   --   --   --   --  161* 146* 129*  BUN 13  --   --   --  18  --   --   --   --   --  21* 18 21*  CREATININE 0.75  --   --   --  0.73  --   --   --   --   --  0.68 0.62 0.83  CALCIUM 10.3  --   --   --  10.8*  --   --   --   --   --  10.3 10.3 9.9  MG  --    < >  --    < > 2.2  --  1.9  --  1.8  --  1.8 1.8 2.0  PHOS  --   --   --    < > 1.3*  --  2.0*  --   --   --  2.9 2.1* 2.6   < > = values in this interval not displayed.   GFR: Estimated Creatinine Clearance: 84.1 mL/min (by C-G formula based on SCr of 0.83 mg/dL). Recent Labs  Lab 06/18/20 0633 06/20/20 0358 06/21/20 0445 06/22/20 0550  WBC 18.1* 17.4* 14.8* 14.3*    Liver Function Tests: Recent Labs  Lab 06/18/20 0633  ALBUMIN 3.1*   No results for input(s): LIPASE, AMYLASE in the last 168 hours. No results for input(s): AMMONIA in the last 168 hours.  ABG    Component Value Date/Time    PHART 7.416 06/17/2020 0939   PCO2ART 35.4 06/17/2020 0939   PO2ART 85 06/17/2020 0939   HCO3 22.8 06/17/2020 0939   TCO2 24 06/17/2020 0939   ACIDBASEDEF 1.0 06/17/2020 0939   O2SAT 97.0 06/17/2020 0939      CBG: Recent Labs  Lab 06/21/20 1506 06/21/20 1932 06/21/20 2318 06/22/20 0304 06/22/20 0737  GLUCAP 124* 117* 105* 104* 124*     Shakya Sebring 08/20/20, MD Internal Medicine Resident PGY-2 [facility] Res[facility] Pager: [phone] 06/22/2020 8:35 AM

## 2020-06-23 LAB — CULTURE, RESPIRATORY W GRAM STAIN: Culture: NORMAL

## 2020-06-23 LAB — BASIC METABOLIC PANEL
Anion gap: 8 (ref 5–15)
BUN: 10 mg/dL (ref 6–20)
CO2: 27 mmol/L (ref 22–32)
Calcium: 9.6 mg/dL (ref 8.9–10.3)
Chloride: 105 mmol/L (ref 98–111)
Creatinine, Ser: 0.56 mg/dL (ref 0.44–1.00)
GFR, Estimated: >60 mL/min (ref >60)
Glucose, Bld: 112 mg/dL — ABNORMAL HIGH (ref 70–99)
Potassium: 2.9 mmol/L — ABNORMAL LOW (ref 3.5–5.1)
Sodium: 140 mmol/L (ref 135–145)

## 2020-06-23 LAB — POCT I-STAT 7, (LYTES, BLD GAS, ICA,H+H)
Acid-Base Excess: 5 mmol/L — ABNORMAL HIGH (ref 0.0–2.0)
Acid-Base Excess: 6 mmol/L — ABNORMAL HIGH (ref 0.0–2.0)
Bicarbonate: 28.9 mmol/L — ABNORMAL HIGH (ref 20.0–28.0)
Bicarbonate: 28.5 mmol/L — ABNORMAL HIGH (ref 20.0–28.0)
Calcium, Ion: 1.45 mmol/L — ABNORMAL HIGH (ref 1.15–1.40)
Calcium, Ion: 1.43 mmol/L — ABNORMAL HIGH (ref 1.15–1.40)
HCT: 36 % (ref 36.0–46.0)
HCT: 31 % — ABNORMAL LOW (ref 36.0–46.0)
Hemoglobin: 12.2 g/dL (ref 12.0–15.0)
Hemoglobin: 10.5 g/dL — ABNORMAL LOW (ref 12.0–15.0)
O2 Saturation: 99 %
O2 Saturation: 99 %
Patient temperature: 36.2
Potassium: 2.9 mmol/L — ABNORMAL LOW (ref 3.5–5.1)
Potassium: 3.1 mmol/L — ABNORMAL LOW (ref 3.5–5.1)
Sodium: 149 mmol/L — ABNORMAL HIGH (ref 135–145)
Sodium: 149 mmol/L — ABNORMAL HIGH (ref 135–145)
TCO2: 30 mmol/L (ref 22–32)
TCO2: 29 mmol/L (ref 22–32)
pCO2 arterial: 37.3 mmHg (ref 32.0–48.0)
pCO2 arterial: 31.6 mmHg — ABNORMAL LOW (ref 32.0–48.0)
pH, Arterial: 7.497 — ABNORMAL HIGH (ref 7.350–7.450)
pH, Arterial: 7.56 — ABNORMAL HIGH (ref 7.350–7.450)
pO2, Arterial: 130 mmHg — ABNORMAL HIGH (ref 83.0–108.0)
pO2, Arterial: 138 mmHg — ABNORMAL HIGH (ref 83.0–108.0)

## 2020-06-23 LAB — GLUCOSE, CAPILLARY
Glucose-Capillary: 84 mg/dL (ref 70–99)
Glucose-Capillary: 80 mg/dL (ref 70–99)
Glucose-Capillary: 113 mg/dL — ABNORMAL HIGH (ref 70–99)
Glucose-Capillary: 105 mg/dL — ABNORMAL HIGH (ref 70–99)
Glucose-Capillary: 90 mg/dL (ref 70–99)
Glucose-Capillary: 114 mg/dL — ABNORMAL HIGH (ref 70–99)
Glucose-Capillary: 118 mg/dL — ABNORMAL HIGH (ref 70–99)

## 2020-06-23 LAB — CBC
HCT: 29.8 % — ABNORMAL LOW (ref 36.0–46.0)
Hemoglobin: 9.3 g/dL — ABNORMAL LOW (ref 12.0–15.0)
MCH: 28 pg (ref 26.0–34.0)
MCHC: 31.2 g/dL (ref 30.0–36.0)
MCV: 89.8 fL (ref 80.0–100.0)
Platelets: 245 10*3/uL (ref 150–400)
RBC: 3.32 MIL/uL — ABNORMAL LOW (ref 3.87–5.11)
RDW: 14.9 % (ref 11.5–15.5)
WBC: 14.8 10*3/uL — ABNORMAL HIGH (ref 4.0–10.5)
nRBC: 0.2 % (ref 0.0–0.2)

## 2020-06-23 LAB — MAGNESIUM: Magnesium: 1.5 mg/dL — ABNORMAL LOW (ref 1.7–2.4)

## 2020-06-23 LAB — POTASSIUM: Potassium: 2.9 mmol/L — ABNORMAL LOW (ref 3.5–5.1)

## 2020-06-23 MED ORDER — RAMELTEON 8 MG PO TABS
8.0000 mg | ORAL_TABLET | Freq: Every day | ORAL | Status: DC
  Administered 2020-06-23: 8 mg via ORAL
  Filled 2020-06-23: qty 1

## 2020-06-23 MED ORDER — VALPROATE SODIUM 100 MG/ML IV SOLN
2000.0000 mg | Freq: Once | INTRAVENOUS | Status: DC
  Filled 2020-06-23: qty 20

## 2020-06-23 MED ORDER — VALPROATE SODIUM 100 MG/ML IV SOLN
500.0000 mg | Freq: Three times a day (TID) | INTRAVENOUS | Status: DC
  Administered 2020-06-23 – 2020-06-29 (×18): 500 mg via INTRAVENOUS
  Filled 2020-06-23 (×20): qty 5

## 2020-06-23 MED ORDER — VALPROATE SODIUM 100 MG/ML IV SOLN
500.0000 mg | Freq: Two times a day (BID) | INTRAVENOUS | Status: DC
  Filled 2020-06-23: qty 5

## 2020-06-23 MED ORDER — POTASSIUM CHLORIDE 20 MEQ PO PACK
40.0000 meq | PACK | ORAL | Status: AC
  Administered 2020-06-24 (×3): 40 meq
  Filled 2020-06-23 (×2): qty 2

## 2020-06-23 MED ORDER — MODAFINIL 100 MG PO TABS: 100.0000 mg | ORAL_TABLET | Freq: Every morning | ORAL | Status: DC

## 2020-06-23 MED ORDER — PHENYLEPHRINE CONCENTRATED 100MG/250ML (0.4 MG/ML) INFUSION SIMPLE
0.0000 ug/min | INTRAVENOUS | Status: DC
  Administered 2020-06-23: 120 ug/min via INTRAVENOUS
  Administered 2020-06-23: 150 ug/min via INTRAVENOUS
  Administered 2020-06-24: 280 ug/min via INTRAVENOUS
  Administered 2020-06-24: 310 ug/min via INTRAVENOUS
  Administered 2020-06-25: 370 ug/min via INTRAVENOUS
  Administered 2020-06-25: 400 ug/min via INTRAVENOUS
  Administered 2020-06-25: 380 ug/min via INTRAVENOUS
  Administered 2020-06-25: 375 ug/min via INTRAVENOUS
  Administered 2020-06-25: 300 ug/min via INTRAVENOUS
  Administered 2020-06-26: 275 ug/min via INTRAVENOUS
  Administered 2020-06-26: 150 ug/min via INTRAVENOUS
  Administered 2020-06-26: 300 ug/min via INTRAVENOUS
  Administered 2020-06-27: 45 ug/min via INTRAVENOUS
  Administered 2020-06-29: 35 ug/min via INTRAVENOUS
  Filled 2020-06-23 (×15): qty 250

## 2020-06-23 MED ORDER — POTASSIUM CHLORIDE 20 MEQ PO PACK
40.0000 meq | PACK | ORAL | Status: AC
  Administered 2020-06-23 (×2): 40 meq
  Filled 2020-06-23: qty 2

## 2020-06-23 NOTE — Progress Notes (Signed)
LTM EEG discontinued - no skin breakdown at unhook.   

## 2020-06-23 NOTE — Progress Notes (Signed)
  NEUROSURGERY PROGRESS NOTE   No issues overnight.   EXAM:  BP (!) 123/58   Pulse 60   Temp 99.5 F (37.5 C) (Axillary)   Resp (!) 22   Ht 5\' 4"  (1.626 m)   Wt 102.7 kg   SpO2 100%   BMI 38.86 kg/m   Intubated Pupils 2-63mm reactive bilaterally Left gaze preference, does not cross midline Not follow commands this am Purposeful LUE/LLE.  No movement RUE/RLE Crani site: c/d/i abd incision: c/d/i  IMPRESSION/PLAN 60 y.o. female  POD6 coiling ruptured LMCA complicated by intraop rupture, left MCA infarct POD4 left hemicraniectomy.  Subclinical seizures, EEG unchanged - continue supportive care, nimotop  - seizures: keppra 1500mg  BID, Depakote added yesterday by Dr 67. Appreciate management of seizures. - vent per CCM

## 2020-06-23 NOTE — Progress Notes (Signed)
Nutrition Follow-up  DOCUMENTATION CODES:   Obesity unspecified  INTERVENTION:   Tube feeding via Cortrak tube: Vital High Protein at 60 ml/h (1440 ml per day)  Provides 1440 kcal, 126 gm protein, 1203 ml free water daily   NUTRITION DIAGNOSIS:   Inadequate oral intake related to inability to eat as evidenced by NPO status. Ongoing.   GOAL:   Patient will meet greater than or equal to 90% of their needs Met with TF.   MONITOR:   TF tolerance  REASON FOR ASSESSMENT:   Ventilator    ASSESSMENT:   Pt with PMH of abdominal hernia admitted with L MCA aneurysm s/p coiling complicated by intraoperative rupture and subsequent severe vasospasm.   Per MD pt passed SBT but unlikely to protect airway, plan for trach and PEG later this week. Per neurology LTM showed epileptic activity pt now on keppra and valproate added.   1/5 cortrak placed; tip gastric  1/7 hemi-craniectomy  Patient is currently intubated on ventilator support MV: 9.3 L/min Temp (24hrs), Avg:99.4 F (37.4 C), Min:98.8 F (37.1 C), Max:99.9 F (37.7 C)   Medications reviewed and include: colace, SSI Neosynephrine @ 150 mcg  Labs reviewed: K + 3.3 (1/10 CBG's: 98-130    Diet Order:   Diet Order            Diet NPO time specified  Diet effective now                 EDUCATION NEEDS:   No education needs have been identified at this time  Skin:  Skin Assessment: Reviewed RN Assessment  Last BM:  375 ml via rectal tube  Height:   Ht Readings from Last 1 Encounters:  06/15/20 $RemoveB'5\' 4"'HgLMGXQH$  (1.626 m)    Weight:   Wt Readings from Last 1 Encounters:  06/20/20 102.7 kg    Ideal Body Weight:  54.4 kg  BMI:  Body mass index is 38.86 kg/m.  Estimated Nutritional Needs:   Kcal:  1400  Protein:  110-135 grams  Fluid:  >1.5 L/day  Lockie Pares., RD, LDN, CNSC See AMiON for contact information

## 2020-06-23 NOTE — Procedures (Addendum)
Name:Denise Savage OVZ:[id number] Epilepsy Attending:Marshelle Bilger [name] Referring Physician/Provider:Dr. Cindra Presume Duration:06/23/2020 0100 to 1/11/20221005  Patient history:60 year old female with ruptured left MCA aneurysm status post coiling,continues to be encephalopathic.EEG to evaluate for seizures.  Level of alertness:comatose  AEDs during EEG study:Keppra  Technical aspects: This EEG study was done with scalp electrodes positioned according to the 10-20 International system of electrode placement. Electrical activity was acquired at a sampling rate of 500Hz  and reviewed with a high frequency filter of 70Hz  and a low frequency filter of 1Hz . EEG data were recorded continuously and digitally stored.   Description:EEG showed continuous generalized 3-5Hz  theta- delta slowing with sharply contoured waves in left frontal region consistent breech artifact.Periodic epileptiform dischargeswere seen in left frontal region at 1-1.5hz  which were intermittent and rhythmic.  ABNORMALITY -Periodic epileptiform discharges, left frontal region -Continuous slow, generalized and lateralizedlefthemisphere - Breech artifact, left frontal region  IMPRESSION: This study showedevidence ofcortical dysfunctionand epileptogenicity in left frontal region consistent with underlying craniotomy andstructural abnormality.EEGshowed periodic epileptiform discharges at 1-1.5hz  in left frontal region which were intermittent and rhythmic which is on the ictal-interictal continuum.Additionally there is severe diffuse encephalopathy, nonspecific etiology.  EEG appears to besimilarto yesterday.  [name] 

## 2020-06-23 NOTE — Progress Notes (Signed)
NAME:  Denise Savage, MRN:  [id number], DOB:  1960/10/01, LOS: 7 ADMISSION DATE:  06/15/2020, CONSULTATION DATE:  06/16/20 REFERRING MD:  Dr. Maurice Small, CHIEF COMPLAINT:  VDRF   Brief History:  60 year old female presented headache x 5 days found to have left sylvian fissure subarachnoid hemorrhage with a left MCA bifurcation aneurysm.  Transferred to [facility] for further care and went for diagnostic angiography with coiling, complicated by intraoperative rupture and subsequent severe vasospasm leading to malignant left MCA stroke.  She returned to the ICU intubated.  Past Medical History:   has a past medical history of Abdominal hernia.  Significant Hospital Events:  1/3 present to [facility] emergency department 1/4 transferred to [facility], NSGY admitting/ taken to neuro IR for coiling.  Remained on vent postop 1/5  more lethargic and unresponsive, stat head CT was done which showed progressive left MCA infarct with brain edema and 6 mm midline shift.  Persistent nonprogressive subarachnoid hemorrhage. 1/6 worsening edema on CT, 3% saline continued 1/7 Keppra increased for subclinical seizures on LTM EEG 1/8 Low-grade febrile 100.9  Consults:  Neuro IR PCCM  Procedures:  1/4 diagnostic angiogram with coiling 1/7 left hemicraniectomy 1/7 R IJ central line  Significant Diagnostic Tests:  1/3 CT angiogram head neck > small volume acute subarachnoid hemorrhage centered at the base of the left sylvian fissure.  6 x 6 x 9 mm irregular aneurysm arising from the left MCA bifurcation.  1/5 CTH >> 1. Diffused and partially reabsorbed subarachnoid hemorrhage since yesterday. 2. 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  1/6 CTH: Progressive swelling of the left MCA infarct with new midline shift measuring 6 mm. Nonprogressive subarachnoid hemorrhage. No evidence of new territory infarct 1/6 CTH -increasing midline shift 8 mm  1/6 LTM EEG evidence of epileptogenicity arising  from right parietal region , no seizures 1/8 LTM EEG >> evidence ofcortical dysfunctionand epileptogenicity in left frontal region consistent with underlying craniotomy andstructural abnormality. After propofol was stopped, eeg showed periodic epileptiform discharges at 1-1.5hz  In left frontal region which were intermittent and rhythmic which is on the ictal-interictal continuum , worse compared to 1/7  1/9 LTM EEG similar to 1/8  1/10 echocardiogram>>EF 60-65%. No valvular abnormalities. Greater than 50% respiratory variability of IVC   Micro Data:  1/3 SARS/ flu >>neg 1/4 MRSA  >> neg 1/8 resp >> ng  Antimicrobials:  Unasyn 1/8>>  Interim History / Subjective:   Started on phenylephrine yesterday due to low MAPS. LTM was showing epileptic activity yesterday so valproate was started  Objective   Blood pressure (!) 97/36, pulse 67, temperature 99.5 F (37.5 C), temperature source Axillary, resp. rate 16, height 5\' 4"  (1.626 m), weight 102.7 kg, SpO2 100 %.    Vent Mode: PRVC FiO2 (%):  [30 %] 30 % Set Rate:  [16 bmp] 16 bmp Vt Set:  [430 mL] 430 mL PEEP:  [5 cmH20] 5 cmH20 Pressure Support:  [8 cmH20] 8 cmH20 Plateau Pressure:  [12 cmH20-14 cmH20] 14 cmH20   Intake/Output Summary (Last 24 hours) at 06/23/2020 0737 Last data filed at 06/23/2020 0536 Gross per 24 hour  Intake 6963.28 ml  Output 6825 ml  Net 138.28 ml   Filed Weights   06/18/20 0500 06/19/20 0500 06/20/20 0500  Weight: 100 kg 101 kg 102.7 kg    Examination: Gen:      Critically ill appearing female HEENT:  Surgical staples present on left parietal region, ETT, LTM. Slight increase in swelling of  the left periorbital region compared to yesterday Neck:     No JVD Lungs:   On pressure support. Bilateral ventilated breath sounds, minimal secretions CV:         RRR, no LE edema. Peripheral extremities cool Abd:      + bowel sounds; rectal tube present Ext:    No edema Skin:      No rash Neuro: off  sedation. Unresponsive. Does not follow commands. Left gaze deviation.  Moves left upper and lower extremities purposefully. Does not respond to painful stimulation on the right upper or lower extremities.  Volume status: net even  Resolved Hospital Problem list     Assessment & Plan:   Aneurysmal subarachnoid hemorrhage due to ruptured left MCA aneurysm s/p coiling. Hunt & Hess score 1, Fisher grade 1. 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 and midline shift s/p hemi-craniectomy 1/7 Acute encephalopathy due to subarachnoid hemorrhage and acute stroke -Evidence of persistent epileptic activity on LTM 1/10>>valproate added -neuro exam remains poor -pressures improved since starting neo 1/10. Echo unremarkable. P: -management per neurosurgery -continue nimlodipine -Continue Keppra and valproate -LTM EEG per neurosurg sx -Maintain neuro protective measures; goal for euthermia, euglycemia, eunatermia, normoxia, and PCO2 goal of 35-40 -continue neo gtt to maintain adequate CPP  Acute hypoxic respiratory failure requiring mechanical ventilation secondary to above. Tolerates pressure support. Mental status precluding extubation. P: -plan for tracheostomy later this week -Daily WUA and SBT's -VAP bundle  Aspiration pneumonia.  Remains afebrile NGTD on 1/8 blood cultures or tracheal aspirate culture P: -continue unasyn to complete 5d course--day #4/5 -repeat CXR in AM -follow blood cultures -prn tylenol for fever  Hypokalemia -replete as needed -daily BMP and Magnesium level  Acute normocytic anemia. No obvious source of blood loss at this time. Continue to follow with daily CBCs.  High risk malnutrition--continue tube feeds. Will need to consider PEG in the near future.  Best practice (evaluated daily)  Diet: NPO, tube feeds Pain/Anxiety/Delirium protocol (if indicated): prn fentanyl VAP protocol (if indicated): Per protocol DVT prophylaxis: SCDs only  GI  prophylaxis: PPI Glucose control: trend, with goal FS 140-180 Mobility: Bedrest Disposition: ICU  Goals of Care:  Last date of multidisciplinary goals of care discussion: 1/5 Family and staff present: daughter , Per neurosurgery Summary of discussion: Continue full aggressive care Follow up goals of care discussion due: daughter wishes to pursue full scope of care (1/10) Code Status: Full   Labs   CBC: Recent Labs  Lab 06/17/20 0316 06/17/20 0939 06/18/20 0633 06/19/20 1345 06/19/20 1513 06/20/20 0358 06/21/20 0445 06/22/20 0550  WBC 10.7*  --  18.1*  --   --  17.4* 14.8* 14.3*  NEUTROABS  --   --  16.1*  --   --   --   --   --   HGB 12.6   < > 12.6 12.2 10.5* 10.8* 10.1* 9.2*  HCT 39.2   < > 38.3 36.0 31.0* 34.9* 31.3* 28.4*  MCV 86.5  --  86.5  --   --  89.9 90.5 90.2  PLT 224  --  247  --   --  212 204 208   < > = values in this interval not displayed.    Basic Metabolic Panel: Recent Labs  Lab 06/17/20 0316 06/17/20 0820 06/18/20 4650 06/18/20 1105 06/18/20 1708 06/18/20 2131 06/19/20 3546 06/19/20 0945 06/19/20 1345 06/19/20 1513 06/20/20 0358 06/21/20 0445 06/22/20 0550  NA 135   < > 144   < >  145   < > 147*   < > 149* 149* 148* 148* 147*  K 3.6   < > 3.5  --   --   --   --   --  2.9* 3.1* 3.1* 3.3* 3.3*  CL 102  --  111  --   --   --   --   --   --   --  111 111 109  CO2 23  --  24  --   --   --   --   --   --   --  26 28 28   GLUCOSE 160*  --  141*  --   --   --   --   --   --   --  161* 146* 129*  BUN 13  --  18  --   --   --   --   --   --   --  21* 18 21*  CREATININE 0.75  --  0.73  --   --   --   --   --   --   --  0.68 0.62 0.83  CALCIUM 10.3  --  10.8*  --   --   --   --   --   --   --  10.3 10.3 9.9  MG  --    < > 2.2  --  1.9  --  1.8  --   --   --  1.8 1.8 2.0  PHOS  --    < > 1.3*  --  2.0*  --   --   --   --   --  2.9 2.1* 2.6   < > = values in this interval not displayed.   GFR: Estimated Creatinine Clearance: 84.1 mL/min (by C-G  formula based on SCr of 0.83 mg/dL). Recent Labs  Lab 06/18/20 0633 06/20/20 0358 06/21/20 0445 06/22/20 0550  WBC 18.1* 17.4* 14.8* 14.3*    Liver Function Tests: Recent Labs  Lab 06/18/20 0633  ALBUMIN 3.1*   No results for input(s): LIPASE, AMYLASE in the last 168 hours. No results for input(s): AMMONIA in the last 168 hours.  ABG    Component Value Date/Time   PHART 7.560 (H) 06/19/2020 1513   PCO2ART 31.6 (L) 06/19/2020 1513   PO2ART 138 (H) 06/19/2020 1513   HCO3 28.5 (H) 06/19/2020 1513   TCO2 29 06/19/2020 1513   ACIDBASEDEF 1.0 06/17/2020 0939   O2SAT 99.0 06/19/2020 1513      CBG: Recent Labs  Lab 06/22/20 1519 06/22/20 1918 06/22/20 2316 06/23/20 0328 06/23/20 0331  GLUCAP 115* 98 130* 84 80     Jackie Littlejohn, MD Internal Medicine Resident PGY-2 08/21/20 Internal Medicine Residency Pager: [phone] 06/23/2020 7:37 AM

## 2020-06-24 ENCOUNTER — Inpatient Hospital Stay (HOSPITAL_COMMUNITY): Payer: Medicaid Other

## 2020-06-24 LAB — BASIC METABOLIC PANEL
Anion gap: 10 (ref 5–15)
BUN: 10 mg/dL (ref 6–20)
CO2: 29 mmol/L (ref 22–32)
Calcium: 9.8 mg/dL (ref 8.9–10.3)
Chloride: 102 mmol/L (ref 98–111)
Creatinine, Ser: 0.64 mg/dL (ref 0.44–1.00)
GFR, Estimated: >60 mL/min (ref >60)
Glucose, Bld: 106 mg/dL — ABNORMAL HIGH (ref 70–99)
Potassium: 3.5 mmol/L (ref 3.5–5.1)
Sodium: 141 mmol/L (ref 135–145)

## 2020-06-24 LAB — CBC
HCT: 31.8 % — ABNORMAL LOW (ref 36.0–46.0)
Hemoglobin: 10 g/dL — ABNORMAL LOW (ref 12.0–15.0)
MCH: 27.9 pg (ref 26.0–34.0)
MCHC: 31.4 g/dL (ref 30.0–36.0)
MCV: 88.6 fL (ref 80.0–100.0)
Platelets: 299 10*3/uL (ref 150–400)
RBC: 3.59 MIL/uL — ABNORMAL LOW (ref 3.87–5.11)
RDW: 14.7 % (ref 11.5–15.5)
WBC: 17.4 10*3/uL — ABNORMAL HIGH (ref 4.0–10.5)
nRBC: 0.5 % — ABNORMAL HIGH (ref 0.0–0.2)

## 2020-06-24 LAB — MAGNESIUM: Magnesium: 1.6 mg/dL — ABNORMAL LOW (ref 1.7–2.4)

## 2020-06-24 LAB — GLUCOSE, CAPILLARY
Glucose-Capillary: 103 mg/dL — ABNORMAL HIGH (ref 70–99)
Glucose-Capillary: 117 mg/dL — ABNORMAL HIGH (ref 70–99)
Glucose-Capillary: 107 mg/dL — ABNORMAL HIGH (ref 70–99)
Glucose-Capillary: 125 mg/dL — ABNORMAL HIGH (ref 70–99)
Glucose-Capillary: 127 mg/dL — ABNORMAL HIGH (ref 70–99)
Glucose-Capillary: 131 mg/dL — ABNORMAL HIGH (ref 70–99)

## 2020-06-24 MED ORDER — POLYETHYLENE GLYCOL 3350 17 G PO PACK: 17.0000 g | PACK | Freq: Every day | ORAL | Status: DC | PRN

## 2020-06-24 MED ORDER — MELATONIN 5 MG PO TABS
5.0000 mg | ORAL_TABLET | Freq: Every day | ORAL | Status: DC
  Administered 2020-06-24 – 2020-07-02 (×7): 5 mg
  Filled 2020-06-24 (×9): qty 1

## 2020-06-24 MED ORDER — MODAFINIL 100 MG PO TABS
100.0000 mg | ORAL_TABLET | Freq: Every day | ORAL | Status: DC
  Administered 2020-06-24 – 2020-09-10 (×79): 100 mg
  Filled 2020-06-24 (×80): qty 1

## 2020-06-24 MED ORDER — PROSOURCE TF PO LIQD
90.0000 mL | Freq: Two times a day (BID) | ORAL | Status: DC
  Administered 2020-06-24 – 2020-09-10 (×157): 90 mL
  Filled 2020-06-24 (×156): qty 90

## 2020-06-24 MED ORDER — OSMOLITE 1.2 CAL PO LIQD
1000.0000 mL | ORAL | Status: DC
  Administered 2020-06-24 – 2020-09-09 (×69): 1000 mL
  Filled 2020-06-24 (×64): qty 1000

## 2020-06-24 MED ORDER — HYDROCODONE-ACETAMINOPHEN 5-325 MG PO TABS
1.0000 | ORAL_TABLET | ORAL | Status: DC | PRN
  Administered 2020-07-04 – 2020-09-09 (×31): 1
  Filled 2020-06-24 (×31): qty 1

## 2020-06-24 MED ORDER — POTASSIUM CHLORIDE 20 MEQ PO PACK
40.0000 meq | PACK | Freq: Once | ORAL | Status: AC
  Administered 2020-06-24: 40 meq

## 2020-06-24 MED ORDER — MIDODRINE HCL 5 MG PO TABS
5.0000 mg | ORAL_TABLET | Freq: Three times a day (TID) | ORAL | Status: DC
  Administered 2020-06-24 – 2020-06-25 (×4): 5 mg
  Filled 2020-06-24 (×4): qty 1

## 2020-06-24 MED ORDER — MAGNESIUM SULFATE 2 GM/50ML IV SOLN
2.0000 g | Freq: Once | INTRAVENOUS | Status: AC
  Administered 2020-06-24: 2 g via INTRAVENOUS
  Filled 2020-06-24: qty 50

## 2020-06-24 NOTE — Progress Notes (Signed)
Nutrition Follow-up  DOCUMENTATION CODES:   Obesity unspecified  INTERVENTION:   Vital High Protein at 60 ml/h  Provides 1440 kcal, 126 gm protein, and 158 grams carbohydrate  Spoke with MD will adjust TF due to low blood sugars  Tube Feeding via Cortrak tube:  Osmolite 1.2 @ 55 ml/hr (1320 ml) 90 ml ProSource BID  Provides: 1744 kcal, 117 grams protein, 208 grams carbohydrate, and 1070 ml free water.   NUTRITION DIAGNOSIS:   Inadequate oral intake related to inability to eat as evidenced by NPO status. Ongoing.   GOAL:   Patient will meet greater than or equal to 90% of their needs Met with TF.   MONITOR:   TF tolerance  REASON FOR ASSESSMENT:   Ventilator    ASSESSMENT:   Pt with PMH of abdominal hernia admitted with L MCA aneurysm s/p coiling complicated by intraoperative rupture and subsequent severe vasospasm.   Per MD pt passed SBT but unlikely to protect airway, plan for trach and PEG later this week. Per neurology LTM showed epileptic activity pt now on keppra and valproate added.  Spoke with resident, pt with low blood sugars will adjust TF regimen.  Neo doubled overnight due to low BP  1/5 cortrak placed; tip gastric  1/7 hemi-craniectomy  Patient is currently intubated on ventilator support MV: 9.3 L/min Temp (24hrs), Avg:100 F (37.8 C), Min:99.6 F (37.6 C), Max:100.4 F (38 C)   Medications reviewed and include: colace, SSI Neosynephrine @ 300 mcg  Labs reviewed: CBG's: 103-117   I/O:  - 2 L UOP: 7675 ml    Diet Order:   Diet Order            Diet NPO time specified  Diet effective now                 EDUCATION NEEDS:   No education needs have been identified at this time  Skin:  Skin Assessment: Reviewed RN Assessment  Last BM:  375 ml via rectal tube  Height:   Ht Readings from Last 1 Encounters:  06/15/20 _0  (1.626 m)    Weight:   Wt Readings from Last 1 Encounters:  06/24/20 103.1 kg    Ideal Body  Weight:  54.4 kg  BMI:  Body mass index is 39.01 kg/m.  Estimated Nutritional Needs:   Kcal:  1600-1800  Protein:  105-125 grams  Fluid:  >1.6 L/day  Lockie Pares., RD, LDN, CNSC See AMiON for contact information

## 2020-06-24 NOTE — Progress Notes (Signed)
  NEUROSURGERY PROGRESS NOTE   No issues overnight  EXAM:  BP (!) 51/33 Comment: bp dropped out of nowhere (increased gtt)  Pulse 63   Temp 100.2 F (37.9 C) (Axillary)   Resp 16   Ht 5\' 4"  (1.626 m)   Wt 103.1 kg   SpO2 100%   BMI 39.01 kg/m   Intubated Pupils 2-50mm reactive bilaterally Left gaze preference, does not cross midline Purposeful LUE/LLE.  No movement RUE/RLE Crani site: c/d/i abd incision: c/d/i  IMPRESSION/PLAN 60 y.o. female POD7 coiling ruptured LMCA complicated by intraop rupture, left MCA infarct POD5 left hemicraniectomy.  Subclinical seizures, EEG unchanged - continue supportive care, nimotop  - seizures: keppra 1500mg  BID, Depakote added Dr 67 although no improvement in exam or EEG. Depressed exam not suspected to be from seizures. - vent per CCM. Plan for trach/PEG

## 2020-06-24 NOTE — Progress Notes (Addendum)
NAME:  Denise Savage, MRN:  [id number], DOB:  02-Jan-1961, LOS: 8 ADMISSION DATE:  06/15/2020, CONSULTATION DATE:  06/16/20 REFERRING MD:  Dr. Maurice Small, CHIEF COMPLAINT:  VDRF   Brief History:  60 year old female presented headache x 5 days found to have left sylvian fissure subarachnoid hemorrhage with a left MCA bifurcation aneurysm.  Transferred to [facility] for further care and went for diagnostic angiography with coiling, complicated by intraoperative rupture and subsequent severe vasospasm leading to malignant left MCA stroke.  She returned to the ICU intubated.  Past Medical History:   has a past medical history of Abdominal hernia.  Significant Hospital Events:  1/3 present to [facility] emergency department 1/4 transferred to [facility], NSGY admitting/ taken to neuro IR for coiling.  Remained on vent postop 1/5  more lethargic and unresponsive, stat head CT was done which showed progressive left MCA infarct with brain edema and 6 mm midline shift.  Persistent nonprogressive subarachnoid hemorrhage. 1/6 worsening edema on CT, 3% saline continued 1/7 Keppra increased for subclinical seizures on LTM EEG 1/8 Low-grade febrile 100.9  Consults:  Neuro IR PCCM  Procedures:  1/4 diagnostic angiogram with coiling 1/7 left hemicraniectomy 1/7 R IJ central line  Significant Diagnostic Tests:  1/3 CT angiogram head neck > small volume acute subarachnoid hemorrhage centered at the base of the left sylvian fissure.  6 x 6 x 9 mm irregular aneurysm arising from the left MCA bifurcation.  1/5 CTH >> 1. Diffused and partially reabsorbed subarachnoid hemorrhage since yesterday. 2. 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  1/6 CTH: Progressive swelling of the left MCA infarct with new midline shift measuring 6 mm. Nonprogressive subarachnoid hemorrhage. No evidence of new territory infarct 1/6 CTH -increasing midline shift 8 mm  1/6 LTM EEG evidence of epileptogenicity arising  from right parietal region , no seizures 1/8 LTM EEG >> evidence ofcortical dysfunctionand epileptogenicity in left frontal region consistent with underlying craniotomy andstructural abnormality. After propofol was stopped, eeg showed periodic epileptiform discharges at 1-1.5hz  In left frontal region which were intermittent and rhythmic which is on the ictal-interictal continuum , worse compared to 1/7  1/9 LTM EEG similar to 1/8  1/10 echocardiogram>>EF 60-65%. No valvular abnormalities. Greater than 50% respiratory variability of IVC   Micro Data:  1/3 SARS/ flu >>neg 1/4 MRSA  >> neg 1/8 resp >> ng  Antimicrobials:  Unasyn 1/8>>  Interim History / Subjective:  No significant overnight events  Objective   Blood pressure (!) 51/33, pulse 63, temperature 100.2 F (37.9 C), temperature source Axillary, resp. rate 16, height 5\' 4"  (1.626 m), weight 103.1 kg, SpO2 100 %.    Vent Mode: PRVC FiO2 (%):  [30 %] 30 % Set Rate:  [16 bmp] 16 bmp Vt Set:  [430 mL] 430 mL PEEP:  [5 cmH20] 5 cmH20 Pressure Support:  [8 cmH20] 8 cmH20 Plateau Pressure:  [14 cmH20-15 cmH20] 14 cmH20   Intake/Output Summary (Last 24 hours) at 06/24/2020 0709 Last data filed at 06/24/2020 0600 Gross per 24 hour  Intake 6380.95 ml  Output 7025 ml  Net -644.05 ml   Filed Weights   06/19/20 0500 06/20/20 0500 06/24/20 0500  Weight: 101 kg 102.7 kg 103.1 kg    Examination: Gen:      Critically ill appearing female HEENT:  Surgical staples present on left parietal region, ETT. Copious oral secretions.  Lungs:   On min vent settings. Lungs clear bilaterally CV:  RRR, no LE edema. Abd:      + bowel sounds; rectal tube present Ext:    No edema Skin:      No rash Neuro: off sedation. Eyes open this morning. PERRL. Does not track with her eyes. Does not follow commands. Moves left upper and lower extremities purposefully. Withdraws to noxious stimuli on the left upper/lower extremities. Does not  withdraw on the right.   Volume status: net -1L  Resolved Hospital Problem list     Assessment & Plan:   Aneurysmal subarachnoid hemorrhage due to ruptured left MCA aneurysm s/p coiling. Hunt & Hess score 1, Fisher grade 1. 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 Persistent findings on LTM after the edition of valproate, LTM discontinued. -neuro exam remains poor -pressures improved since starting neo 1/10. Echo unremarkable. P: -management per neurosurgery -continue nimlodipine -Continue Keppra and valproate -Maintain neuro protective measures; goal for euthermia, euglycemia, eunatermia, normoxia, and PCO2 goal of 35-40 -continue neo gtt to maintain adequate CPP  Acute hypoxic respiratory failure requiring mechanical ventilation secondary to above. Tolerates pressure support. Mental status precluding extubation. She had copious oral secretions on exam today.  It is doubtful that her mental status will improve significantly in a short period of time for her to be able to protect her airway sufficiently for extubation. Her daughters seem open to the idea of tracheostomy based on our discussions over the past 2 days. P: -plan for tracheostomy later this week. Will confirm with her daughter  -Daily WUA and SBT's -VAP bundle  Aspiration pneumonia.  Remains afebrile NGTD on 1/8 blood cultures or tracheal aspirate culture P: -unasyn to complete 5d course--day #5/5 -follow blood cultures -prn tylenol for fever  Hypokalemia. Likely due to high UOP volume.  Polyuria--7.5L of urine out 1/11. She has been consistently hypernatremic with the exception of yesterday. Discussed possible DI with neurosurg who does not feel this is the case. -replete electrolytes as needed -daily BMP and Magnesium  level  Post-op anemia. Stable.   High risk malnutrition--continue tube feeds. Will need to consider PEG in the near future.  Best practice (evaluated daily)  Diet: NPO, tube feeds Pain/Anxiety/Delirium protocol (if indicated): prn fentanyl VAP protocol (if indicated): Per protocol DVT prophylaxis: SCDs only  GI prophylaxis: PPI Glucose control: trend, with goal FS 140-180 Mobility: Bedrest Disposition: ICU  Goals of Care:  Last date of multidisciplinary goals of care discussion: 1/5 Family and staff present: daughter , Per neurosurgery Summary of discussion: Continue full aggressive care Follow up goals of care discussion due: daughter wishes to pursue full scope of care (1/11) Code Status: Full   Labs   CBC: Recent Labs  Lab 06/18/20 0633 06/19/20 1345 06/20/20 0358 06/21/20 0445 06/22/20 0550 06/23/20 1015 06/24/20 0500  WBC 18.1*  --  17.4* 14.8* 14.3* 14.8* 17.4*  NEUTROABS 16.1*  --   --   --   --   --   --   HGB 12.6   < > 10.8* 10.1* 9.2* 9.3* 10.0*  HCT 38.3   < > 34.9* 31.3* 28.4* 29.8* 31.8*  MCV 86.5  --  89.9 90.5 90.2 89.8 88.6  PLT 247  --  212 204 208 245 299   < > = values in this interval not displayed.    Basic Metabolic Panel: Recent Labs  Lab 06/18/20 [phone] 06/18/20  1105 06/18/20 1708 06/18/20 2131 06/19/20 0607 06/19/20 0945 06/19/20 1513 06/20/20 0358 06/21/20 0445 06/22/20 0550 06/23/20 1015 06/23/20 2000  NA 144   < > 145   < > 147*   < > 149* 148* 148* 147* 140  --   K 3.5  --   --   --   --    < > 3.1* 3.1* 3.3* 3.3* 2.9* 2.9*  CL 111  --   --   --   --   --   --  111 111 109 105  --   CO2 24  --   --   --   --   --   --  26 28 28 27   --   GLUCOSE 141*  --   --   --   --   --   --  161* 146* 129* 112*  --   BUN 18  --   --   --   --   --   --  21* 18 21* 10  --   CREATININE 0.73  --   --   --   --   --   --  0.68 0.62 0.83 0.56  --   CALCIUM 10.8*  --   --   --   --   --   --  10.3 10.3 9.9 9.6  --   MG 2.2  --  1.9  --  1.8   --   --  1.8 1.8 2.0 1.5*  --   PHOS 1.3*  --  2.0*  --   --   --   --  2.9 2.1* 2.6  --   --    < > = values in this interval not displayed.   GFR: Estimated Creatinine Clearance: 87.5 mL/min (by C-G formula based on SCr of 0.56 mg/dL). Recent Labs  Lab 06/21/20 0445 06/22/20 0550 06/23/20 1015 06/24/20 0500  WBC 14.8* 14.3* 14.8* 17.4*    Liver Function Tests: Recent Labs  Lab 06/18/20 0633  ALBUMIN 3.1*   No results for input(s): LIPASE, AMYLASE in the last 168 hours. No results for input(s): AMMONIA in the last 168 hours.  ABG    Component Value Date/Time   PHART 7.560 (H) 06/19/2020 1513   PCO2ART 31.6 (L) 06/19/2020 1513   PO2ART 138 (H) 06/19/2020 1513   HCO3 28.5 (H) 06/19/2020 1513   TCO2 29 06/19/2020 1513   ACIDBASEDEF 1.0 06/17/2020 0939   O2SAT 99.0 06/19/2020 1513      CBG: Recent Labs  Lab 06/23/20 1124 06/23/20 1520 06/23/20 1918 06/23/20 2306 06/24/20 0312  GLUCAP 105* 90 114* 118* 103*     Billy Rocco 08/22/20, MD Internal Medicine Resident PGY-2 [facility] Pager: [phone] 06/24/2020 7:09 AM

## 2020-06-25 LAB — CBC
HCT: 34.3 % — ABNORMAL LOW (ref 36.0–46.0)
Hemoglobin: 11 g/dL — ABNORMAL LOW (ref 12.0–15.0)
MCH: 28.3 pg (ref 26.0–34.0)
MCHC: 32.1 g/dL (ref 30.0–36.0)
MCV: 88.2 fL (ref 80.0–100.0)
Platelets: 354 10*3/uL (ref 150–400)
RBC: 3.89 MIL/uL (ref 3.87–5.11)
RDW: 14.8 % (ref 11.5–15.5)
WBC: 16 10*3/uL — ABNORMAL HIGH (ref 4.0–10.5)
nRBC: 0.8 % — ABNORMAL HIGH (ref 0.0–0.2)

## 2020-06-25 LAB — BASIC METABOLIC PANEL
Anion gap: 10 (ref 5–15)
BUN: 14 mg/dL (ref 6–20)
CO2: 28 mmol/L (ref 22–32)
Calcium: 10.2 mg/dL (ref 8.9–10.3)
Chloride: 101 mmol/L (ref 98–111)
Creatinine, Ser: 0.66 mg/dL (ref 0.44–1.00)
GFR, Estimated: >60 mL/min (ref >60)
Glucose, Bld: 120 mg/dL — ABNORMAL HIGH (ref 70–99)
Potassium: 3.4 mmol/L — ABNORMAL LOW (ref 3.5–5.1)
Sodium: 139 mmol/L (ref 135–145)

## 2020-06-25 LAB — GLUCOSE, CAPILLARY
Glucose-Capillary: 119 mg/dL — ABNORMAL HIGH (ref 70–99)
Glucose-Capillary: 127 mg/dL — ABNORMAL HIGH (ref 70–99)
Glucose-Capillary: 125 mg/dL — ABNORMAL HIGH (ref 70–99)
Glucose-Capillary: 111 mg/dL — ABNORMAL HIGH (ref 70–99)
Glucose-Capillary: 110 mg/dL — ABNORMAL HIGH (ref 70–99)
Glucose-Capillary: 123 mg/dL — ABNORMAL HIGH (ref 70–99)

## 2020-06-25 LAB — CULTURE, BLOOD (ROUTINE X 2)
Culture: NO GROWTH
Culture: NO GROWTH

## 2020-06-25 LAB — MAGNESIUM: Magnesium: 1.9 mg/dL (ref 1.7–2.4)

## 2020-06-25 MED ORDER — SODIUM CHLORIDE 0.9 % IV BOLUS
500.0000 mL | Freq: Once | INTRAVENOUS | Status: AC
  Administered 2020-06-25: 500 mL via INTRAVENOUS

## 2020-06-25 MED ORDER — POTASSIUM CHLORIDE 20 MEQ PO PACK: 40.0000 meq | PACK | ORAL | Status: DC

## 2020-06-25 MED ORDER — MAGNESIUM SULFATE 2 GM/50ML IV SOLN
2.0000 g | Freq: Once | INTRAVENOUS | Status: AC
  Administered 2020-06-25: 2 g via INTRAVENOUS
  Filled 2020-06-25: qty 50

## 2020-06-25 MED ORDER — MIDODRINE HCL 5 MG PO TABS
10.0000 mg | ORAL_TABLET | Freq: Three times a day (TID) | ORAL | Status: DC
  Administered 2020-06-25 – 2020-07-11 (×47): 10 mg
  Filled 2020-06-25 (×46): qty 2

## 2020-06-25 MED ORDER — POTASSIUM CHLORIDE 20 MEQ PO PACK
40.0000 meq | PACK | ORAL | Status: AC
  Administered 2020-06-25 (×2): 40 meq
  Filled 2020-06-25: qty 2

## 2020-06-25 NOTE — Progress Notes (Addendum)
  NEUROSURGERY PROGRESS NOTE   No issues overnight.  EXAM:  BP (!) 119/55   Pulse 72   Temp 99.6 F (37.6 C) (Axillary)   Resp (!) 22   Ht 5\' 4"  (1.626 m)   Wt 101.5 kg   SpO2 100%   BMI 38.41 kg/m   Intubated Awakens to pain. Keeps eyes open. Pupils 2-24mm reactive bilaterally Left gaze preference Purposeful LUE. Moves LLE to central pain Reportedly will follow commands intermittently with LUE No movement RUE/RLE Crani site: c/d/i abd incision: c/d/i  IMPRESSION/PLAN 60 y.o. female POD8 coiling ruptured LMCA complicated by intraop rupture, left MCA infarct POD6 left hemicraniectomy.  Subclinical seizures - continue supportive care, nimotop  - seizures: continue Keppra and Depakote per neuro - vent per CCM. Likely will need trach

## 2020-06-25 NOTE — Progress Notes (Signed)
NAME:  Denise Savage, MRN:  [id number], DOB:  [birth year], LOS: 9 ADMISSION DATE:  06/15/2020, CONSULTATION DATE:  06/16/20 REFERRING MD:  Dr. Maurice Small, CHIEF COMPLAINT:  VDRF   Brief History:  60 year old female presented headache x 5 days found to have left sylvian fissure subarachnoid hemorrhage with a left MCA bifurcation aneurysm.  Transferred to [facility] for further care and went for diagnostic angiography with coiling, complicated by intraoperative rupture and subsequent severe vasospasm leading to malignant left MCA stroke.  She returned to the ICU intubated.  Past Medical History:   has a past medical history of Abdominal hernia.  Significant Hospital Events:  1/3 present to [facility] emergency department 1/4 transferred to [facility], NSGY admitting/ taken to neuro IR for coiling.  Remained on vent postop 1/5  more lethargic and unresponsive, stat head CT was done which showed progressive left MCA infarct with brain edema and 6 mm midline shift.  Persistent nonprogressive subarachnoid hemorrhage. 1/6 worsening edema on CT, 3% saline continued 1/7 Keppra increased for subclinical seizures on LTM EEG 1/8 Low-grade febrile 100.9 1/12 modafinil started  Consults:  Neuro IR PCCM  Procedures:  1/4 diagnostic angiogram with coiling 1/7 left hemicraniectomy 1/7 R IJ central line  Significant Diagnostic Tests:  1/3 CT angiogram head neck > small volume acute subarachnoid hemorrhage centered at the base of the left sylvian fissure.  6 x 6 x 9 mm irregular aneurysm arising from the left MCA bifurcation.  1/5 CTH >> 1. Diffused and partially reabsorbed subarachnoid hemorrhage since yesterday. 2. 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  1/6 CTH: Progressive swelling of the left MCA infarct with new midline shift measuring 6 mm. Nonprogressive subarachnoid hemorrhage. No evidence of new territory infarct 1/6 CTH -increasing midline shift 8 mm  1/6 LTM EEG evidence of  epileptogenicity arising from right parietal region , no seizures 1/8 LTM EEG >> evidence ofcortical dysfunctionand epileptogenicity in left frontal region consistent with underlying craniotomy andstructural abnormality. After propofol was stopped, eeg showed periodic epileptiform discharges at 1-1.5hz  In left frontal region which were intermittent and rhythmic which is on the ictal-interictal continuum , worse compared to 1/7  1/9 LTM EEG similar to 1/8  1/10 echocardiogram>>EF 60-65%. No valvular abnormalities. Greater than 50% respiratory variability of IVC   Micro Data:  1/3 SARS/ flu >>neg 1/4 MRSA  >> neg 1/8 resp >> ng  Antimicrobials:  Unasyn 1/8>>1/12  Interim History / Subjective:  More awake this morning.   Objective   Blood pressure (!) 119/55, pulse 88, temperature 99.6 F (37.6 C), temperature source Axillary, resp. rate (!) 21, height 5\' 4"  (1.626 m), weight 101.5 kg, SpO2 100 %.    Vent Mode: PSV;CPAP FiO2 (%):  [30 %] 30 % Set Rate:  [16 bmp] 16 bmp Vt Set:  [430 mL] 430 mL PEEP:  [5 cmH20] 5 cmH20 Pressure Support:  [8 cmH20-10 cmH20] 10 cmH20 Plateau Pressure:  [16 cmH20-17 cmH20] 16 cmH20   Intake/Output Summary (Last 24 hours) at 06/25/2020 0804 Last data filed at 06/25/2020 0700 Gross per 24 hour  Intake 5217.98 ml  Output 4100 ml  Net 1117.98 ml   Filed Weights   06/20/20 0500 06/24/20 0500 06/25/20 0500  Weight: 102.7 kg 103.1 kg 101.5 kg    Examination: Gen: Critically ill appearing female HEENT: Surgical staples present on left parietal region, ETT.  Lungs: On pressure support. Lungs clear bilaterally CV: RRR, no LE edema. Abd: + bowel sounds; rectal tube present Extremities:  No edema Skin: No rash Neuro: off sedation. Awakens to voice. PERRL. Follows commands on the left although delayed--hand squeeze, moved foot. Does not move right upper or lower extremity.  Resolved Hospital Problem list   Aspiration pneumonia--received 5d course  of unasyn 1/8-1/12  Assessment & Plan:   Aneurysmal subarachnoid hemorrhage due to ruptured left MCA aneurysm s/p coiling. Hunt & Hess score 1, Fisher grade 1. 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 and midline shift s/p hemi-craniectomy 1/7 Acute encephalopathy due to subarachnoid hemorrhage and acute stroke -exam is slowly improving since starting modafinil. Awakens to voice. Following commands intermittently on the left  P: -management per neurosurgery -continue nimlodipine -Continue Keppra and valproate -Maintain neuro protective measures; goal for euthermia, euglycemia, eunatermia, normoxia, and PCO2 goal of 35-40 -continue neo gtt to maintain MAP >65 -continue midodrine (started 1/12) -continue delirium precautions -continue modafinil and melatonin to help adjust her sleep wake cycle.  Critical illness requiring mechanical ventilation secondary to above. Tolerates pressure support. Mental status is very slowly improving but still not appropriate for extubation at this point.  P: -I do think it is reasonable to hold off on tracheostomy for a few more days to get a sense of what her trajectory will be and if mental status continues to improve to the point of being able to extubate. -Daily WUA and SBT's -VAP bundle  Hypokalemia. Likely due to high stool ouput -replete electrolytes as needed -daily BMP and Magnesium level  Post-op anemia. Stable.   High risk malnutrition--continue tube feeds. Will need to consider PEG in the near future.  Best practice (evaluated daily)  Diet: NPO, tube feeds Pain/Anxiety/Delirium protocol (if indicated): prn fentanyl, delirium precautions VAP protocol (if indicated): Per protocol DVT prophylaxis: SCDs only  GI prophylaxis: PPI Glucose control: trend, with goal FS 140-180 Mobility: Bedrest Disposition: ICU  Goals of Care:  Last date of multidisciplinary goals of care discussion: 1/5 Family and staff present: daughter ,  Per neurosurgery Summary of discussion: Continue full aggressive care Follow up goals of care discussion due: daughter wishes to pursue full scope of care (1/11) Code Status: Full   Labs   CBC: Recent Labs  Lab 06/21/20 0445 06/22/20 0550 06/23/20 1015 06/24/20 0500 06/25/20 0506  WBC 14.8* 14.3* 14.8* 17.4* 16.0*  HGB 10.1* 9.2* 9.3* 10.0* 11.0*  HCT 31.3* 28.4* 29.8* 31.8* 34.3*  MCV 90.5 90.2 89.8 88.6 88.2  PLT 204 208 245 299 354    Basic Metabolic Panel: Recent Labs  Lab 06/18/20 1708 06/18/20 2131 06/20/20 0358 06/21/20 0445 06/22/20 0550 06/23/20 1015 06/23/20 2000 06/24/20 0500 06/25/20 0506  NA 145   < > 148* 148* 147* 140  --  141 139  K  --    < > 3.1* 3.3* 3.3* 2.9* 2.9* 3.5 3.4*  CL  --    < > 111 111 109 105  --  102 101  CO2  --    < > 26 28 28 27   --  29 28  GLUCOSE  --    < > 161* 146* 129* 112*  --  106* 120*  BUN  --    < > 21* 18 21* 10  --  10 14  CREATININE  --    < > 0.68 0.62 0.83 0.56  --  0.64 0.66  CALCIUM  --    < > 10.3 10.3 9.9 9.6  --  9.8 10.2  MG 1.9   < > 1.8 1.8 2.0 1.5*  --  1.6* 1.9  PHOS 2.0*  --  2.9 2.1* 2.6  --   --   --   --    < > = values in this interval not displayed.   GFR: Estimated Creatinine Clearance: 86.7 mL/min (by C-G formula based on SCr of 0.66 mg/dL). Recent Labs  Lab 06/22/20 0550 06/23/20 1015 06/24/20 0500 06/25/20 0506  WBC 14.3* 14.8* 17.4* 16.0*    Liver Function Tests: No results for input(s): AST, ALT, ALKPHOS, BILITOT, PROT, ALBUMIN in the last 168 hours. No results for input(s): LIPASE, AMYLASE in the last 168 hours. No results for input(s): AMMONIA in the last 168 hours.  ABG    Component Value Date/Time   PHART 7.560 (H) 06/19/2020 1513   PCO2ART 31.6 (L) 06/19/2020 1513   PO2ART 138 (H) 06/19/2020 1513   HCO3 28.5 (H) 06/19/2020 1513   TCO2 29 06/19/2020 1513   ACIDBASEDEF 1.0 06/17/2020 0939   O2SAT 99.0 06/19/2020 1513      CBG: Recent Labs  Lab 06/24/20 1523  06/24/20 1930 06/24/20 2310 06/25/20 0309 06/25/20 0733  GLUCAP 125* 127* 131* 119* 127*     Tarell Schollmeyer [facility], MD Internal Medicine Resident PGY-2 [facility] Pager: [phone] 06/25/2020 8:04 AM

## 2020-06-26 DIAGNOSIS — J9601 Acute respiratory failure with hypoxia: Secondary | ICD-10-CM

## 2020-06-26 DIAGNOSIS — I609 Nontraumatic subarachnoid hemorrhage, unspecified: Secondary | ICD-10-CM

## 2020-06-26 DIAGNOSIS — G9340 Encephalopathy, unspecified: Secondary | ICD-10-CM

## 2020-06-26 DIAGNOSIS — I608 Other nontraumatic subarachnoid hemorrhage: Secondary | ICD-10-CM

## 2020-06-26 LAB — BASIC METABOLIC PANEL
Anion gap: 9 (ref 5–15)
BUN: 13 mg/dL (ref 6–20)
CO2: 27 mmol/L (ref 22–32)
Calcium: 10.1 mg/dL (ref 8.9–10.3)
Chloride: 102 mmol/L (ref 98–111)
Creatinine, Ser: 0.7 mg/dL (ref 0.44–1.00)
GFR, Estimated: >60 mL/min (ref >60)
Glucose, Bld: 139 mg/dL — ABNORMAL HIGH (ref 70–99)
Potassium: 3.6 mmol/L (ref 3.5–5.1)
Sodium: 138 mmol/L (ref 135–145)

## 2020-06-26 LAB — GLUCOSE, CAPILLARY
Glucose-Capillary: 108 mg/dL — ABNORMAL HIGH (ref 70–99)
Glucose-Capillary: 110 mg/dL — ABNORMAL HIGH (ref 70–99)
Glucose-Capillary: 98 mg/dL (ref 70–99)
Glucose-Capillary: 104 mg/dL — ABNORMAL HIGH (ref 70–99)
Glucose-Capillary: 107 mg/dL — ABNORMAL HIGH (ref 70–99)
Glucose-Capillary: 123 mg/dL — ABNORMAL HIGH (ref 70–99)

## 2020-06-26 LAB — MAGNESIUM: Magnesium: 1.9 mg/dL (ref 1.7–2.4)

## 2020-06-26 LAB — VALPROIC ACID LEVEL: Valproic Acid Lvl: 43 ug/mL — ABNORMAL LOW (ref 50.0–100.0)

## 2020-06-26 NOTE — Progress Notes (Signed)
NAME:  Denise Savage, MRN:  [id number], DOB:  25-Jun-1960, LOS: 10 ADMISSION DATE:  06/15/2020, CONSULTATION DATE:  06/16/20 REFERRING MD:  Dr. Maurice Small, CHIEF COMPLAINT:  VDRF   Brief History:  60 year old female presented headache x 5 days found to have left sylvian fissure subarachnoid hemorrhage with a left MCA bifurcation aneurysm.  Transferred to [facility] for further care and went for diagnostic angiography with coiling, complicated by intraoperative rupture and subsequent severe vasospasm leading to malignant left MCA stroke.  She returned to the ICU intubated.  Past Medical History:   has a past medical history of Abdominal hernia.  Significant Hospital Events:  1/3 present to [facility] emergency department 1/4 transferred to [facility], NSGY admitting/ taken to neuro IR for coiling.  Remained on vent postop 1/5  more lethargic and unresponsive, stat head CT was done which showed progressive left MCA infarct with brain edema and 6 mm midline shift.  Persistent nonprogressive subarachnoid hemorrhage. 1/6 worsening edema on CT, 3% saline continued 1/7 Keppra increased for subclinical seizures on LTM EEG 1/8 Low-grade febrile 100.9 1/12 modafinil started  Consults:  Neuro IR PCCM  Procedures:  1/4 diagnostic angiogram with coiling 1/7 left hemicraniectomy 1/7 R IJ central line  Significant Diagnostic Tests:  1/3 CT angiogram head neck > small volume acute subarachnoid hemorrhage centered at the base of the left sylvian fissure.  6 x 6 x 9 mm irregular aneurysm arising from the left MCA bifurcation.  1/5 CTH >> 1. Diffused and partially reabsorbed subarachnoid hemorrhage since yesterday. 2. 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  1/6 CTH: Progressive swelling of the left MCA infarct with new midline shift measuring 6 mm. Nonprogressive subarachnoid hemorrhage. No evidence of new territory infarct 1/6 CTH -increasing midline shift 8 mm  1/6 LTM EEG evidence of  epileptogenicity arising from right parietal region , no seizures 1/8 LTM EEG >> evidence ofcortical dysfunctionand epileptogenicity in left frontal region consistent with underlying craniotomy andstructural abnormality. After propofol was stopped, eeg showed periodic epileptiform discharges at 1-1.5hz  In left frontal region which were intermittent and rhythmic which is on the ictal-interictal continuum , worse compared to 1/7  1/9 LTM EEG similar to 1/8  1/10 echocardiogram>>EF 60-65%. No valvular abnormalities. Greater than 50% respiratory variability of IVC   Micro Data:  1/3 SARS/ flu >>neg 1/4 MRSA  >> neg 1/8 resp >> ng  Antimicrobials:  Unasyn 1/8>>1/12  Interim History / Subjective:  No significant overnight events  Objective   Blood pressure (!) 144/75, pulse 80, temperature (!) 100.4 F (38 C), temperature source Axillary, resp. rate 18, height 5\' 4"  (1.626 m), weight 101.7 kg, SpO2 100 %.    Vent Mode: PRVC FiO2 (%):  [30 %] 30 % Set Rate:  [16 bmp] 16 bmp Vt Set:  [430 mL] 430 mL PEEP:  [5 cmH20] 5 cmH20 Pressure Support:  [10 cmH20] 10 cmH20 Plateau Pressure:  [15 cmH20-17 cmH20] 17 cmH20   Intake/Output Summary (Last 24 hours) at 06/26/2020 0724 Last data filed at 06/26/2020 0700 Gross per 24 hour  Intake 4782.52 ml  Output 1950 ml  Net 2832.52 ml   Filed Weights   06/24/20 0500 06/25/20 0500 06/26/20 0500  Weight: 103.1 kg 101.5 kg 101.7 kg    Examination: Gen: Critically ill appearing female HEENT: Surgical staples present on left parietal region, ETT.  Lungs: On minimal vent settings. Lungs clear bilaterally. Minimal secretions CV: RRR, no LE edema. Abd: + bowel sounds; rectal tube present  Extremities: No edema Skin: No rash Neuro: off sedation. Awakens to voice. Inconsistently follows commands on the left though significantly delayed. Some purposeful movements on the left. No purposeful movements on the right.   Resolved Hospital Problem list    Aspiration pneumonia--received 5d course of unasyn 1/8-1/12  Assessment & Plan:   Aneurysmal subarachnoid hemorrhage due to ruptured left MCA aneurysm s/p coiling. Hunt & Hess score 1, Fisher grade 1. 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 Still not following commands consistently on the left P: -management per neurosurgery -continue nimodipine -continue neo gtt to maintain MAP >65 -continue midodrine (started 1/12) (increased 1/13) -Continue Keppra and valproate -Maintain neuro protective measures; goal for euthermia, euglycemia, eunatermia, normoxia, and PCO2 goal of 35-40 -continue delirium precautions -continue modafinil and melatonin to help adjust her sleep wake cycle.  Critical illness requiring mechanical ventilation secondary to above. Tolerates pressure support. Her mental status is still precluding extubation. She has seemed to respond well to the modafinil in terms of being more awake but is still not consistently following commands P: -will continue to plan to hold off on tracheostomy over the weekend and see if her mental status improves to the extent that extubation is possible. Otherwise will plan for tracheostomy early next week -Daily WUA and SBT's -VAP bundle  Post-op anemia. Stable.   High risk malnutrition--continue tube feeds. Will need to consider PEG in the near future.  Best practice (evaluated daily)  Diet: NPO, tube feeds Pain/Anxiety/Delirium protocol (if indicated): prn fentanyl, delirium precautions VAP protocol (if indicated): Per protocol DVT prophylaxis: SCDs only  GI prophylaxis: PPI Glucose control: trend, with goal FS 140-180 Mobility: Bedrest Disposition: ICU  Goals of Care:  Last date of multidisciplinary goals of care discussion: 1/5 Family and staff present: daughter , Per neurosurgery Summary of discussion: Continue full aggressive  care Follow up goals of care discussion due: daughter wishes to pursue full scope of care (1/12) Code Status: Full   Labs   CBC: Recent Labs  Lab 06/21/20 0445 06/22/20 0550 06/23/20 1015 06/24/20 0500 06/25/20 0506  WBC 14.8* 14.3* 14.8* 17.4* 16.0*  HGB 10.1* 9.2* 9.3* 10.0* 11.0*  HCT 31.3* 28.4* 29.8* 31.8* 34.3*  MCV 90.5 90.2 89.8 88.6 88.2  PLT 204 208 245 299 354    Basic Metabolic Panel: Recent Labs  Lab 06/20/20 0358 06/21/20 0445 06/22/20 0550 06/23/20 1015 06/23/20 2000 06/24/20 0500 06/25/20 0506 06/26/20 0555  NA 148* 148* 147* 140  --  141 139 138  K 3.1* 3.3* 3.3* 2.9* 2.9* 3.5 3.4* 3.6  CL 111 111 109 105  --  102 101 102  CO2 26 28 28 27   --  29 28 27   GLUCOSE 161* 146* 129* 112*  --  106* 120* 139*  BUN 21* 18 21* 10  --  10 14 13   CREATININE 0.68 0.62 0.83 0.56  --  0.64 0.66 0.70  CALCIUM 10.3 10.3 9.9 9.6  --  9.8 10.2 10.1  MG 1.8 1.8 2.0 1.5*  --  1.6* 1.9 1.9  PHOS 2.9 2.1* 2.6  --   --   --   --   --    GFR: Estimated Creatinine Clearance: 86.8 mL/min (by C-G formula based on SCr of 0.7 mg/dL). Recent Labs  Lab 06/22/20 0550 06/23/20 1015 06/24/20 0500 06/25/20 0506  WBC 14.3* 14.8* 17.4* 16.0*    Liver Function Tests: No results for  input(s): AST, ALT, ALKPHOS, BILITOT, PROT, ALBUMIN in the last 168 hours. No results for input(s): LIPASE, AMYLASE in the last 168 hours. No results for input(s): AMMONIA in the last 168 hours.  ABG    Component Value Date/Time   PHART 7.560 (H) 06/19/2020 1513   PCO2ART 31.6 (L) 06/19/2020 1513   PO2ART 138 (H) 06/19/2020 1513   HCO3 28.5 (H) 06/19/2020 1513   TCO2 29 06/19/2020 1513   ACIDBASEDEF 1.0 06/17/2020 0939   O2SAT 99.0 06/19/2020 1513      CBG: Recent Labs  Lab 06/25/20 1113 06/25/20 1510 06/25/20 1915 06/25/20 2301 06/26/20 0325  GLUCAP 125* 111* 110* 123* 108*     Tricia Oaxaca Ephriam Knuckles, MD Internal Medicine Resident PGY-2 [facility] Pager:  [phone] 06/26/2020 7:24 AM

## 2020-06-26 NOTE — Progress Notes (Signed)
  NEUROSURGERY PROGRESS NOTE   No issues overnight.  EXAM:  BP (!) 144/75   Pulse 80   Temp (!) 100.4 F (38 C) (Axillary)   Resp 18   Ht 5\' 4"  (1.626 m)   Wt 101.7 kg   SpO2 100%   BMI 38.49 kg/m   Intubated awake Pupils 2-40mm reactive bilaterally Left gaze preference Not following commands Purposeful LUE. Moves LLE to central pain No movement RUE/RLE Crani site: c/d/i abd incision: c/d/i  IMPRESSION/PLAN 60 y.o. female POD9coiling ruptured LMCA complicated by intraop rupture, left MCA infarct POD7left hemicraniectomy.  Subclinical seizures - continue supportive care, nimotop  -seizures:continue Keppra and Depakote per neuro -vent per CCM. Likely will need trach

## 2020-06-27 LAB — GLUCOSE, CAPILLARY
Glucose-Capillary: 106 mg/dL — ABNORMAL HIGH (ref 70–99)
Glucose-Capillary: 109 mg/dL — ABNORMAL HIGH (ref 70–99)
Glucose-Capillary: 111 mg/dL — ABNORMAL HIGH (ref 70–99)
Glucose-Capillary: 116 mg/dL — ABNORMAL HIGH (ref 70–99)
Glucose-Capillary: 93 mg/dL (ref 70–99)
Glucose-Capillary: 97 mg/dL (ref 70–99)

## 2020-06-27 LAB — BASIC METABOLIC PANEL
Anion gap: 7 (ref 5–15)
BUN: 16 mg/dL (ref 6–20)
CO2: 27 mmol/L (ref 22–32)
Calcium: 9.8 mg/dL (ref 8.9–10.3)
Chloride: 106 mmol/L (ref 98–111)
Creatinine, Ser: 0.65 mg/dL (ref 0.44–1.00)
GFR, Estimated: >60 mL/min (ref >60)
Glucose, Bld: 117 mg/dL — ABNORMAL HIGH (ref 70–99)
Potassium: 3.4 mmol/L — ABNORMAL LOW (ref 3.5–5.1)
Sodium: 140 mmol/L (ref 135–145)

## 2020-06-27 LAB — MAGNESIUM: Magnesium: 1.9 mg/dL (ref 1.7–2.4)

## 2020-06-27 MED ORDER — LORAZEPAM 2 MG/ML IJ SOLN
INTRAMUSCULAR | Status: AC
  Filled 2020-06-27: qty 1

## 2020-06-27 MED ORDER — LORAZEPAM 2 MG/ML IJ SOLN
2.0000 mg | Freq: Once | INTRAMUSCULAR | Status: AC
  Administered 2020-06-27: 2 mg via INTRAVENOUS

## 2020-06-27 MED ORDER — POTASSIUM CHLORIDE 20 MEQ PO PACK
40.0000 meq | PACK | ORAL | Status: AC
  Administered 2020-06-27 (×3): 40 meq
  Filled 2020-06-27 (×3): qty 2

## 2020-06-27 NOTE — Progress Notes (Signed)
Late entry Assisted tele visit to patient with family member.  Redmond Pulling, RN

## 2020-06-27 NOTE — Progress Notes (Signed)
NT called RN to room. Pt left arm and left leg twitching rhythmically.. Dr. Everardo All and Dr. Franky Macho on unit and entered room. Order given for 2 mg IV ativan. Seizure witnessed approximately 3 min.

## 2020-06-27 NOTE — Plan of Care (Signed)
  Problem: Nutrition: Goal: Adequate nutrition will be maintained Outcome: Progressing   

## 2020-06-27 NOTE — Progress Notes (Signed)
NAME:  Denise Savage, MRN:  [id number], DOB:  [birth year], LOS: 11 ADMISSION DATE:  06/15/2020, CONSULTATION DATE:  06/16/20 REFERRING MD:  Dr. Maurice Small, CHIEF COMPLAINT:  VDRF   Brief History:  60 year old female presented headache x 5 days found to have left sylvian fissure subarachnoid hemorrhage with a left MCA bifurcation aneurysm.  Transferred to [facility] for further care and went for diagnostic angiography with coiling, complicated by intraoperative rupture and subsequent severe vasospasm leading to malignant left MCA stroke.  She returned to the ICU intubated.  Past Medical History:   has a past medical history of Abdominal hernia.  Significant Hospital Events:  1/3 present to [facility] emergency department 1/4 transferred to [facility], NSGY admitting/ taken to neuro IR for coiling.  Remained on vent postop 1/5  more lethargic and unresponsive, stat head CT was done which showed progressive left MCA infarct with brain edema and 6 mm midline shift.  Persistent nonprogressive subarachnoid hemorrhage. 1/6 worsening edema on CT, 3% saline continued 1/7 Keppra increased for subclinical seizures on LTM EEG 1/8 Low-grade febrile 100.9 1/12 modafinil started  Consults:  Neuro IR PCCM  Procedures:  1/4 diagnostic angiogram with coiling 1/7 left hemicraniectomy 1/7 R IJ central line  Significant Diagnostic Tests:  1/3 CT angiogram head neck > small volume acute subarachnoid hemorrhage centered at the base of the left sylvian fissure.  6 x 6 x 9 mm irregular aneurysm arising from the left MCA bifurcation.  1/5 CTH >> 1. Diffused and partially reabsorbed subarachnoid hemorrhage since yesterday. 2. 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  1/6 CTH: Progressive swelling of the left MCA infarct with new midline shift measuring 6 mm. Nonprogressive subarachnoid hemorrhage. No evidence of new territory infarct 1/6 CTH -increasing midline shift 8 mm  1/6 LTM EEG evidence of  epileptogenicity arising from right parietal region , no seizures 1/8 LTM EEG >> evidence ofcortical dysfunctionand epileptogenicity in left frontal region consistent with underlying craniotomy andstructural abnormality. After propofol was stopped, eeg showed periodic epileptiform discharges at 1-1.5hz  In left frontal region which were intermittent and rhythmic which is on the ictal-interictal continuum , worse compared to 1/7  1/9 LTM EEG similar to 1/8  1/10 echocardiogram>>EF 60-65%. No valvular abnormalities. Greater than 50% respiratory variability of IVC   Micro Data:  1/3 SARS/ flu >>neg 1/4 MRSA  >> neg 1/8 resp >> ng  Antimicrobials:  Unasyn 1/8>>1/12  Interim History / Subjective:  Opens eyes to voice. Does not follow commands. Remains on low-dose vasopressor  Objective   Blood pressure 132/67, pulse 70, temperature 99.4 F (37.4 C), temperature source Axillary, resp. rate 20, height 5\' 4"  (1.626 m), weight 101 kg, SpO2 100 %.    Vent Mode: PSV FiO2 (%):  [30 %] 30 % Set Rate:  [16 bmp] 16 bmp Vt Set:  [430 mL] 430 mL PEEP:  [5 cmH20] 5 cmH20 Pressure Support:  [10 cmH20] 10 cmH20 Plateau Pressure:  [15 cmH20-17 cmH20] 17 cmH20   Intake/Output Summary (Last 24 hours) at 06/27/2020 1056 Last data filed at 06/27/2020 0800 Gross per 24 hour  Intake 2338.85 ml  Output 1600 ml  Net 738.85 ml   Filed Weights   06/25/20 0500 06/26/20 0500 06/27/20 0500  Weight: 101.5 kg 101.7 kg 101 kg    Physical Exam: General: Well-appearing, no acute distress HENT: , AT, ETT in place Eyes: EOMI, no scleral icterus Respiratory: Clear to auscultation bilaterally.  No crackles, wheezing or rales Cardiovascular: RRR, -M/R/G,  no JVD GI: BS+, soft, nontender Extremities:-Edema,-tenderness Neuro: Drowsy, opens eyes to voice, does not follow commands with me. Per nursing left sided purposeful movements   Resolved Hospital Problem list   Aspiration pneumonia--received 5d  course of unasyn 1/8-1/12  Assessment & Plan:   Aneurysmal subarachnoid hemorrhage due to ruptured left MCA aneurysm s/p coiling. Hunt & Hess score 1, Fisher grade 1. 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 Still not following commands consistently on the left P: -management per neurosurgery -continue nimodipine -wean neo gtt to maintain MAP >65 -continue midodrine (started 1/12) -Continue Keppra and valproate -Maintain neuro protective measures; goal for euthermia, euglycemia, eunatermia, normoxia, and PCO2 goal of 35-40 -continue delirium precautions -continue modafinil and melatonin to help adjust her sleep wake cycle.  Critical illness requiring mechanical ventilation secondary to above. Tolerates pressure support. Her mental status is still precluding extubation. She has seemed to respond well to the modafinil in terms of being more awake but is still not consistently following commands Tolerating PS P: -On Day 11 on mechanical ventilation -Full vent support -Daily SBT/WUA -Plan to hold off on tracheostomy over the weekend and see if her mental status improves to the extent that extubation is possible. Otherwise will plan for tracheostomy early next week -Daily WUA and SBT's -VAP bundle  Post-op anemia. Stable.   High risk malnutrition--continue tube feeds. Will need to consider PEG in the near future.  Best practice (evaluated daily)  Diet: NPO, tube feeds Pain/Anxiety/Delirium protocol (if indicated): prn fentanyl, delirium precautions VAP protocol (if indicated): Per protocol DVT prophylaxis: SCDs only  GI prophylaxis: PPI Glucose control: trend, with goal FS 140-180 Mobility: Bedrest Disposition: ICU  Goals of Care:  Last date of multidisciplinary goals of care discussion: 1/5 Family and staff present: daughter , Per neurosurgery Summary of discussion: Continue  full aggressive care Follow up goals of care discussion due: daughter wishes to pursue full scope of care (1/12) Code Status: Full   Labs   CBC: Recent Labs  Lab 06/21/20 0445 06/22/20 0550 06/23/20 1015 06/24/20 0500 06/25/20 0506  WBC 14.8* 14.3* 14.8* 17.4* 16.0*  HGB 10.1* 9.2* 9.3* 10.0* 11.0*  HCT 31.3* 28.4* 29.8* 31.8* 34.3*  MCV 90.5 90.2 89.8 88.6 88.2  PLT 204 208 245 299 354    Basic Metabolic Panel: Recent Labs  Lab 06/21/20 0445 06/22/20 0550 06/23/20 1015 06/23/20 2000 06/24/20 0500 06/25/20 0506 06/26/20 0555 06/27/20 0555  NA 148* 147* 140  --  141 139 138 140  K 3.3* 3.3* 2.9* 2.9* 3.5 3.4* 3.6 3.4*  CL 111 109 105  --  102 101 102 106  CO2 28 28 27   --  29 28 27 27   GLUCOSE 146* 129* 112*  --  106* 120* 139* 117*  BUN 18 21* 10  --  10 14 13 16   CREATININE 0.62 0.83 0.56  --  0.64 0.66 0.70 0.65  CALCIUM 10.3 9.9 9.6  --  9.8 10.2 10.1 9.8  MG 1.8 2.0 1.5*  --  1.6* 1.9 1.9 1.9  PHOS 2.1* 2.6  --   --   --   --   --   --    GFR: Estimated Creatinine Clearance: 86.4 mL/min (by C-G formula based on SCr of 0.65 mg/dL). Recent Labs  Lab 06/22/20 0550 06/23/20 1015 06/24/20 0500 06/25/20 0506  WBC 14.3* 14.8* 17.4* 16.0*    Liver Function Tests: No  results for input(s): AST, ALT, ALKPHOS, BILITOT, PROT, ALBUMIN in the last 168 hours. No results for input(s): LIPASE, AMYLASE in the last 168 hours. No results for input(s): AMMONIA in the last 168 hours.  ABG    Component Value Date/Time   PHART 7.560 (H) 06/19/2020 1513   PCO2ART 31.6 (L) 06/19/2020 1513   PO2ART 138 (H) 06/19/2020 1513   HCO3 28.5 (H) 06/19/2020 1513   TCO2 29 06/19/2020 1513   ACIDBASEDEF 1.0 06/17/2020 0939   O2SAT 99.0 06/19/2020 1513      CBG: Recent Labs  Lab 06/26/20 1536 06/26/20 1922 06/26/20 2255 06/27/20 0320 06/27/20 0802  GLUCAP 104* 107* 123* 97 109*     The patient is critically ill with multiple organ systems failure and requires high  complexity decision making for assessment and support, frequent evaluation and titration of therapies, application of advanced monitoring technologies and extensive interpretation of multiple databases.  Independent Critical Care Time: 31 Minutes.   Mechele Collin, M.D. [facility] Pulmonary/Critical Care Medicine 06/27/2020 10:56 AM   Please see Amion for pager number to reach on-call Pulmonary and Critical Care Team.

## 2020-06-27 NOTE — Progress Notes (Signed)
Subjective: NAEs o/n  Objective: Vital signs in last 24 hours: Temp:  [98.4 F (36.9 C)-100.4 F (38 C)] 99.4 F (37.4 C) (01/15 0800) Pulse Rate:  [57-86] 70 (01/15 0800) Resp:  [16-23] 20 (01/15 0800) BP: (107-169)/(55-92) 132/67 (01/15 0800) SpO2:  [96 %-100 %] 100 % (01/15 0800) FiO2 (%):  [30 %] 30 % (01/15 0756) Weight:  [101 kg] 101 kg (01/15 0500)  Intake/Output from previous day: 01/14 0701 - 01/15 0700 In: 2364.6 [I.V.:883.4; NG/GT:1313.5; IV Piggyback:167.7] Out: 2100 [Urine:2100] Intake/Output this shift: Total I/O In: 125.5 [I.V.:9; NG/GT:116.5] Out: -   Eyes open spontaneously, regards, PERRL Localizes LUE, w/d LLE Incision c/d Flap full but soft  Lab Results: Recent Labs    06/25/20 0506  WBC 16.0*  HGB 11.0*  HCT 34.3*  PLT 354   BMET Recent Labs    06/26/20 0555 06/27/20 0555  NA 138 140  K 3.6 3.4*  CL 102 106  CO2 27 27  GLUCOSE 139* 117*  BUN 13 16  CREATININE 0.70 0.65  CALCIUM 10.1 9.8    Studies/Results: No results found.  Assessment/Plan: MCA aneurysm rupture s/p coiling and hemicraniectomy for CVA - continue supportive care, nimotop   Bedelia Person 06/27/2020, 10:51 AM

## 2020-06-27 NOTE — Progress Notes (Signed)
PCCM Interval Note  Called to bedside with NSG team for witnessed left sided seizure. Ativan 2mg  ordered. Patient already on nimotop. Defer to NSG for further recommendations.  , M.D. [facility] Pulmonary/Critical Care Medicine 06/27/2020 3:37 PM

## 2020-06-28 DIAGNOSIS — R0689 Other abnormalities of breathing: Secondary | ICD-10-CM

## 2020-06-28 LAB — GLUCOSE, CAPILLARY
Glucose-Capillary: 107 mg/dL — ABNORMAL HIGH (ref 70–99)
Glucose-Capillary: 105 mg/dL — ABNORMAL HIGH (ref 70–99)
Glucose-Capillary: 117 mg/dL — ABNORMAL HIGH (ref 70–99)
Glucose-Capillary: 116 mg/dL — ABNORMAL HIGH (ref 70–99)
Glucose-Capillary: 115 mg/dL — ABNORMAL HIGH (ref 70–99)
Glucose-Capillary: 107 mg/dL — ABNORMAL HIGH (ref 70–99)

## 2020-06-28 LAB — BASIC METABOLIC PANEL
Anion gap: 7 (ref 5–15)
BUN: 18 mg/dL (ref 6–20)
CO2: 30 mmol/L (ref 22–32)
Calcium: 10.4 mg/dL — ABNORMAL HIGH (ref 8.9–10.3)
Chloride: 101 mmol/L (ref 98–111)
Creatinine, Ser: 0.62 mg/dL (ref 0.44–1.00)
GFR, Estimated: >60 mL/min (ref >60)
Glucose, Bld: 118 mg/dL — ABNORMAL HIGH (ref 70–99)
Potassium: 4.3 mmol/L (ref 3.5–5.1)
Sodium: 138 mmol/L (ref 135–145)

## 2020-06-28 MED ORDER — VALPROATE SODIUM 100 MG/ML IV SOLN
500.0000 mg | Freq: Once | INTRAVENOUS | Status: AC
  Administered 2020-06-28: 500 mg via INTRAVENOUS
  Filled 2020-06-28: qty 5

## 2020-06-28 NOTE — Progress Notes (Signed)
NAME:  Denise Savage, MRN:  [id number], DOB:  Oct 14, 1960, LOS: 12 ADMISSION DATE:  06/15/2020, CONSULTATION DATE:  06/16/20 REFERRING MD:  Dr. Maurice Small, CHIEF COMPLAINT:  VDRF   Brief History:  60 year old female presented headache x 5 days found to have left sylvian fissure subarachnoid hemorrhage with a left MCA bifurcation aneurysm.  Transferred to [facility] for further care and went for diagnostic angiography with coiling, complicated by intraoperative rupture and subsequent severe vasospasm leading to malignant left MCA stroke.  She returned to the ICU intubated.  Past Medical History:   has a past medical history of Abdominal hernia.  Significant Hospital Events:  1/3 present to [facility] emergency department 1/4 transferred to [facility], NSGY admitting/ taken to neuro IR for coiling.  Remained on vent postop 1/5  more lethargic and unresponsive, stat head CT was done which showed progressive left MCA infarct with brain edema and 6 mm midline shift.  Persistent nonprogressive subarachnoid hemorrhage. 1/6 worsening edema on CT, 3% saline continued 1/7 Keppra increased for subclinical seizures on LTM EEG 1/8 Low-grade febrile 100.9 1/12 modafinil started  Consults:  Neuro IR PCCM  Procedures:  1/4 diagnostic angiogram with coiling 1/7 left hemicraniectomy 1/7 R IJ central line  Significant Diagnostic Tests:  1/3 CT angiogram head neck > small volume acute subarachnoid hemorrhage centered at the base of the left sylvian fissure.  6 x 6 x 9 mm irregular aneurysm arising from the left MCA bifurcation.  1/5 CTH >> 1. Diffused and partially reabsorbed subarachnoid hemorrhage since yesterday. 2. 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  1/6 CTH: Progressive swelling of the left MCA infarct with new midline shift measuring 6 mm. Nonprogressive subarachnoid hemorrhage. No evidence of new territory infarct 1/6 CTH -increasing midline shift 8 mm  1/6 LTM EEG evidence of  epileptogenicity arising from right parietal region , no seizures 1/8 LTM EEG >> evidence ofcortical dysfunctionand epileptogenicity in left frontal region consistent with underlying craniotomy andstructural abnormality. After propofol was stopped, eeg showed periodic epileptiform discharges at 1-1.5hz  In left frontal region which were intermittent and rhythmic which is on the ictal-interictal continuum , worse compared to 1/7  1/9 LTM EEG similar to 1/8  1/10 echocardiogram>>EF 60-65%. No valvular abnormalities. Greater than 50% respiratory variability of IVC   Micro Data:  1/3 SARS/ flu >>neg 1/4 MRSA  >> neg 1/8 resp >> ng  Antimicrobials:  Unasyn 1/8>>1/12  Interim History / Subjective:  Yesterday had seizure. Remains vent dependent. On low dose pressor.  Objective   Blood pressure 114/68, pulse 88, temperature 98.3 F (36.8 C), temperature source Axillary, resp. rate (!) 26, height 5\' 4"  (1.626 m), weight 105.2 kg, SpO2 100 %.    Vent Mode: PSV;CPAP FiO2 (%):  [30 %] 30 % Set Rate:  [16 bmp] 16 bmp Vt Set:  [430 mL] 430 mL PEEP:  [5 cmH20] 5 cmH20 Pressure Support:  [5 cmH20-10 cmH20] 5 cmH20 Plateau Pressure:  [15 cmH20-16 cmH20] 15 cmH20   Intake/Output Summary (Last 24 hours) at 06/28/2020 1158 Last data filed at 06/28/2020 1000 Gross per 24 hour  Intake 1736.25 ml  Output 1400 ml  Net 336.25 ml   Filed Weights   06/26/20 0500 06/27/20 0500 06/28/20 0500  Weight: 101.7 kg 101 kg 105.2 kg   Physical Exam: General: Well-appearing, no acute distress HENT: [place], AT, ETT in place, enlarged tongue Eyes: EOMI, no scleral icterus Respiratory: Clear to auscultation bilaterally.  No crackles, wheezing or rales Cardiovascular: RRR, -  M/R/G, no JVD GI: BS+, soft, nontender Extremities:-Edema,-tenderness Neuro: Drowsy, opens eyes to voice, does not follow commands with me   Resolved Hospital Problem list   Aspiration pneumonia--received 5d course of unasyn  1/8-1/12  Assessment & Plan:   Aneurysmal subarachnoid hemorrhage due to ruptured left MCA aneurysm s/p coiling. Hunt & Hess score 1, Fisher grade 1. 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 Still not following commands consistently on the left -remains on low-dose vasopressor P: -management per neurosurgery -continue nimodipine -wean neo gtt to maintain MAP >65 -continue midodrine (started 1/12) -continue Keppra and valproate. Reload valproate due subtherapeutic levels -Maintain neuro protective measures; goal for euthermia, euglycemia, eunatermia, normoxia, and PCO2 goal of 35-40 -continue delirium precautions -continue modafinil and melatonin to help adjust her sleep wake cycle.  Critical illness requiring mechanical ventilation secondary to above. Tolerates pressure support. Her mental status is still precluding extubation. She has seemed to respond well to the modafinil in terms of being more awake but is still not consistently following commands Tolerating PS P: -On Day 12  on mechanical ventilation -Full vent support. Extubation precluded by mental status, recent seizure and enlarged tongue -Discussed with both daughters, Alica and [name], regarding tracheostomy. They wish to continue aggressive care to give patient time for potential neurologic recovery -Daily WUA and SBT's -VAP bundle  Post-op anemia. Stable.   High risk malnutrition--continue tube feeds. Will need to consider PEG in the near future.  Best practice (evaluated daily)  Diet: NPO, tube feeds Pain/Anxiety/Delirium protocol (if indicated): prn fentanyl, delirium precautions VAP protocol (if indicated): Per protocol DVT prophylaxis: SCDs only  GI prophylaxis: PPI Glucose control: trend, with goal FS 140-180 Mobility: Bedrest Disposition: ICU  Goals of Care:  Last date of multidisciplinary goals of care  discussion: 1/16 Family and staff present: both daughters, [name] and Foster, PCCM Summary of discussion: Continue full aggressive care Follow up goals of care discussion due: daughters wish to pursue full scope of care (1/16) Code Status: Full   Labs   CBC: Recent Labs  Lab 06/22/20 0550 06/23/20 1015 06/24/20 0500 06/25/20 0506  WBC 14.3* 14.8* 17.4* 16.0*  HGB 9.2* 9.3* 10.0* 11.0*  HCT 28.4* 29.8* 31.8* 34.3*  MCV 90.2 89.8 88.6 88.2  PLT 208 245 299 354    Basic Metabolic Panel: Recent Labs  Lab 06/22/20 0550 06/23/20 1015 06/23/20 2000 06/24/20 0500 06/25/20 0506 06/26/20 0555 06/27/20 0555 06/28/20 0528  NA 147* 140  --  141 139 138 140 138  K 3.3* 2.9*   < > 3.5 3.4* 3.6 3.4* 4.3  CL 109 105  --  102 101 102 106 101  CO2 28 27  --  29 28 27 27 30   GLUCOSE 129* 112*  --  106* 120* 139* 117* 118*  BUN 21* 10  --  10 14 13 16 18   CREATININE 0.83 0.56  --  0.64 0.66 0.70 0.65 0.62  CALCIUM 9.9 9.6  --  9.8 10.2 10.1 9.8 10.4*  MG 2.0 1.5*  --  1.6* 1.9 1.9 1.9  --   PHOS 2.6  --   --   --   --   --   --   --    < > = values in this interval not displayed.   GFR: Estimated Creatinine Clearance: 88.4 mL/min (by C-G formula based on SCr of 0.62 mg/dL). Recent Labs  Lab 06/22/20 0550 06/23/20  1015 06/24/20 0500 06/25/20 0506  WBC 14.3* 14.8* 17.4* 16.0*    Liver Function Tests: No results for input(s): AST, ALT, ALKPHOS, BILITOT, PROT, ALBUMIN in the last 168 hours. No results for input(s): LIPASE, AMYLASE in the last 168 hours. No results for input(s): AMMONIA in the last 168 hours.  ABG    Component Value Date/Time   PHART 7.560 (H) 06/19/2020 1513   PCO2ART 31.6 (L) 06/19/2020 1513   PO2ART 138 (H) 06/19/2020 1513   HCO3 28.5 (H) 06/19/2020 1513   TCO2 29 06/19/2020 1513   ACIDBASEDEF 1.0 06/17/2020 0939   O2SAT 99.0 06/19/2020 1513      CBG: Recent Labs  Lab 06/27/20 1915 06/27/20 2340 06/28/20 0306 06/28/20 0754 06/28/20 1115  GLUCAP  106* 116* 107* 105* 117*   The patient is critically ill with multiple organ systems failure and requires high complexity decision making for assessment and support, frequent evaluation and titration of therapies, application of advanced monitoring technologies and extensive interpretation of multiple databases.  Independent Critical Care Time: 40 Minutes.   Mechele Collin, M.D. [facility] Pulmonary/Critical Care Medicine 06/28/2020 1:56 PM   Please see Amion for pager number to reach on-call Pulmonary and Critical Care Team.

## 2020-06-28 NOTE — Plan of Care (Signed)
  Problem: Safety: Goal: Non-violent Restraint(s) Outcome: Progressing   

## 2020-06-28 NOTE — Progress Notes (Signed)
Subjective: Possible left sized seizure yesterday  Objective: Vital signs in last 24 hours: Temp:  [98.2 F (36.8 C)-99.6 F (37.6 C)] 98.3 F (36.8 C) (01/16 0800) Pulse Rate:  [63-97] 97 (01/16 1030) Resp:  [13-29] 18 (01/16 0615) BP: (81-140)/(53-121) 116/71 (01/16 1030) SpO2:  [94 %-100 %] 100 % (01/16 1030) FiO2 (%):  [30 %] 30 % (01/16 0725) Weight:  [105.2 kg] 105.2 kg (01/16 0500)  Intake/Output from previous day: 01/15 0701 - 01/16 0700 In: 1614.8 [I.V.:167.7; NG/GT:1326.5; IV Piggyback:120.6] Out: 1400 [Urine:1300; Stool:100] Intake/Output this shift: Total I/O In: 247 [I.V.:27; NG/GT:220] Out: -  Intubated Eyes closed to stim Flap full but soft Incision c/d Localizes LUE, w/d LLE  Lab Results: No results for input(s): WBC, HGB, HCT, PLT in the last 72 hours. BMET Recent Labs    06/27/20 0555 06/28/20 0528  NA 140 138  K 3.4* 4.3  CL 106 101  CO2 27 30  GLUCOSE 117* 118*  BUN 16 18  CREATININE 0.65 0.62  CALCIUM 9.8 10.4*    Studies/Results: No results found.  Assessment/Plan: Ruptured aneurysm - Continue supportive care - Keppra 1500 mg - Nimodipine - likely will need trach   Denise Savage 06/28/2020, 10:59 AM

## 2020-06-28 NOTE — Progress Notes (Signed)
Assisted tele visit to patient with family member.  Meliah Appleman M, RN  

## 2020-06-28 NOTE — Progress Notes (Signed)
Assisted tele visit to patient with family member.  Nohlan Burdin Harold, RN  

## 2020-06-29 LAB — GLUCOSE, CAPILLARY
Glucose-Capillary: 119 mg/dL — ABNORMAL HIGH (ref 70–99)
Glucose-Capillary: 122 mg/dL — ABNORMAL HIGH (ref 70–99)
Glucose-Capillary: 131 mg/dL — ABNORMAL HIGH (ref 70–99)
Glucose-Capillary: 116 mg/dL — ABNORMAL HIGH (ref 70–99)
Glucose-Capillary: 101 mg/dL — ABNORMAL HIGH (ref 70–99)
Glucose-Capillary: 107 mg/dL — ABNORMAL HIGH (ref 70–99)

## 2020-06-29 LAB — AMMONIA: Ammonia: 18 umol/L (ref 9–35)

## 2020-06-29 MED ORDER — FUROSEMIDE 10 MG/ML IJ SOLN
40.0000 mg | Freq: Once | INTRAMUSCULAR | Status: AC
  Administered 2020-06-29: 40 mg via INTRAVENOUS
  Filled 2020-06-29: qty 4

## 2020-06-29 MED ORDER — ETOMIDATE 2 MG/ML IV SOLN
40.0000 mg | Freq: Once | INTRAVENOUS | Status: DC
  Filled 2020-06-29: qty 20

## 2020-06-29 MED ORDER — FENTANYL CITRATE (PF) 100 MCG/2ML IJ SOLN
200.0000 ug | Freq: Once | INTRAMUSCULAR | Status: DC
  Filled 2020-06-29: qty 4

## 2020-06-29 MED ORDER — MIDAZOLAM HCL 2 MG/2ML IJ SOLN
5.0000 mg | Freq: Once | INTRAMUSCULAR | Status: DC
  Filled 2020-06-29: qty 6

## 2020-06-29 MED ORDER — ROCURONIUM BROMIDE 10 MG/ML (PF) SYRINGE: 70.0000 mg | PREFILLED_SYRINGE | Freq: Once | INTRAVENOUS | Status: DC

## 2020-06-29 MED ORDER — ROCURONIUM BROMIDE 10 MG/ML (PF) SYRINGE
PREFILLED_SYRINGE | INTRAVENOUS | Status: AC
  Filled 2020-06-29: qty 10

## 2020-06-29 MED ORDER — VECURONIUM BROMIDE 10 MG IV SOLR
10.0000 mg | Freq: Once | INTRAVENOUS | Status: DC
  Filled 2020-06-29: qty 10

## 2020-06-29 MED ORDER — PROPOFOL 10 MG/ML IV BOLUS: 500.0000 mg | Freq: Once | INTRAVENOUS | Status: DC

## 2020-06-29 MED ORDER — CHLORHEXIDINE GLUCONATE CLOTH 2 % EX PADS
6.0000 | MEDICATED_PAD | Freq: Every day | CUTANEOUS | Status: DC
  Administered 2020-06-30 – 2020-07-13 (×13): 6 via TOPICAL

## 2020-06-29 NOTE — Progress Notes (Addendum)
Daughter [name] updated about Trach procedure set for 1300. She gave verbal consent over the phone with two RNs. She will update the rest of the family.  Uyen Eichholz, [place] Scrape, RN   Family again updated about inability to do Trach at the bedside after the ultrasound was completed. Explained that CCM MD would consult ENT to do procedure in the [place] and we do not have a specific time scheduled yet. They would like an update from CCM. Message handed along. Moya Duan, [place] Scrape, RN

## 2020-06-29 NOTE — Progress Notes (Signed)
  NEUROSURGERY PROGRESS NOTE   No issues overnight.  EXAM:  BP 118/63   Pulse 98   Temp (!) 100.4 F (38 C) (Axillary)   Resp (!) 30   Ht 5\' 4"  (1.626 m)   Wt 105.3 kg   SpO2 100%   BMI 39.85 kg/m   Intubated Opens eyes to pain Pupils reactive bilaterally Left gaze preference Not following commands Purposeful LUE.Moves LLE to central pain No movement RUE/RLE Crani site: c/d/i abd incision: c/d/i  IMPRESSION/PLAN 60 y.o. female POD12coiling ruptured LMCA complicated by intraop rupture, left MCA infarct POD10left hemicraniectomy.  Subclinical seizures - continue supportive care, nimotop  -seizures:continue Keppra and Depakote per neuro -vent per CCM.Likely will need trach

## 2020-06-29 NOTE — Progress Notes (Signed)
NAME:  Denise Savage, MRN:  [id number], DOB:  11-Jan-1961, LOS: 13 ADMISSION DATE:  06/15/2020, CONSULTATION DATE:  06/16/20 REFERRING MD:  Dr. Maurice Small, CHIEF COMPLAINT:  VDRF   Brief History:  60 year old female presented headache x 5 days found to have left sylvian fissure subarachnoid hemorrhage with a left MCA bifurcation aneurysm.  Transferred to [facility] for further care and went for diagnostic angiography with coiling, complicated by intraoperative rupture and subsequent severe vasospasm leading to malignant left MCA stroke.  She returned to the ICU intubated.  Past Medical History:   has a past medical history of Abdominal hernia.  Significant Hospital Events:  1/3 present to [facility] emergency department 1/4 transferred to [facility], NSGY admitting/ taken to neuro IR for coiling.  Remained on vent postop 1/5  more lethargic and unresponsive, stat head CT was done which showed progressive left MCA infarct with brain edema and 6 mm midline shift.  Persistent nonprogressive subarachnoid hemorrhage. 1/6 worsening edema on CT, 3% saline continued 1/7 Keppra increased for subclinical seizures on LTM EEG 1/8 Low-grade febrile 100.9 1/12 modafinil started  Consults:  Neuro IR PCCM  Procedures:  1/4 diagnostic angiogram with coiling 1/7 left hemicraniectomy 1/7 R IJ central line  Significant Diagnostic Tests:  1/3 CT angiogram head neck > small volume acute subarachnoid hemorrhage centered at the base of the left sylvian fissure.  6 x 6 x 9 mm irregular aneurysm arising from the left MCA bifurcation.  1/5 CTH >> 1. Diffused and partially reabsorbed subarachnoid hemorrhage since yesterday. 2. 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  1/6 CTH: Progressive swelling of the left MCA infarct with new midline shift measuring 6 mm. Nonprogressive subarachnoid hemorrhage. No evidence of new territory infarct 1/6 CTH -increasing midline shift 8 mm  1/6 LTM EEG evidence of  epileptogenicity arising from right parietal region , no seizures 1/8 LTM EEG >> evidence ofcortical dysfunctionand epileptogenicity in left frontal region consistent with underlying craniotomy andstructural abnormality. After propofol was stopped, eeg showed periodic epileptiform discharges at 1-1.5hz  In left frontal region which were intermittent and rhythmic which is on the ictal-interictal continuum , worse compared to 1/7  1/9 LTM EEG similar to 1/8  1/10 echocardiogram>>EF 60-65%. No valvular abnormalities. Greater than 50% respiratory variability of IVC   Micro Data:  1/3 SARS/ flu >>neg 1/4 MRSA  >> neg 1/8 resp >> ng  Antimicrobials:  Unasyn 1/8>>1/12  Interim History / Subjective:  No further seizures in the last 24 hours. Remains on full vent support. Opens eyes to noxious stimuli  Objective   Blood pressure 118/63, pulse 98, temperature (!) 100.4 F (38 C), temperature source Axillary, resp. rate (!) 30, height 5\' 4"  (1.626 m), weight 105.3 kg, SpO2 100 %.    Vent Mode: PSV FiO2 (%):  [30 %] 30 % Set Rate:  [16 bmp] 16 bmp Vt Set:  [430 mL] 430 mL PEEP:  [5 cmH20] 5 cmH20 Pressure Support:  [5 cmH20] 5 cmH20 Plateau Pressure:  [13 cmH20-16 cmH20] 14 cmH20   Intake/Output Summary (Last 24 hours) at 06/29/2020 1010 Last data filed at 06/29/2020 1000 Gross per 24 hour  Intake 1736.02 ml  Output 2510 ml  Net -773.98 ml   Filed Weights   06/27/20 0500 06/28/20 0500 06/29/20 0500  Weight: 101 kg 105.2 kg 105.3 kg   Physical Exam: General: Well-appearing, no acute distress HENT: [place], AT, ETT in place, enlarged tongue Eyes: EOMI, no scleral icterus Respiratory: Clear to auscultation bilaterally.  No crackles, wheezing or rales Cardiovascular: RRR, -M/R/G, no JVD GI: BS+, soft, nontender Extremities:-Edema,-tenderness Neuro: Opens eyes to noxious stimuli, does not follow commands, spontaneous movement of left side  Resolved Hospital Problem list   Aspiration  pneumonia--received 5d course of unasyn 1/8-1/12  Assessment & Plan:   Aneurysmal subarachnoid hemorrhage due to ruptured left MCA aneurysm s/p coiling. Hunt & Hess score 1, Fisher grade 1. 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 Still not following commands consistently on the left -remains on low-dose vasopressor P: -management per neurosurgery -continue nimodipine -wean neo gtt to maintain MAP >65. Continues to remain on pressors due to nimodipine administration -continue midodrine 10 mg TID(started 1/12) -continue keppra and valproate -Maintain neuro protective measures; goal for euthermia, euglycemia, eunatermia, normoxia, and PCO2 goal of 35-40 -continue delirium precautions -continue modafinil and melatonin to help adjust her sleep wake cycle.  Critical illness requiring mechanical ventilation secondary to above. Tolerates pressure support. Her mental status is still precluding extubation. She has seemed to respond well to the modafinil in terms of being more awake but is still not consistently following commands Tolerating PS P: -On Day 13  on mechanical ventilation. Unlikely to be safely extubated in the near future. Will plan for tracheostomy today or tomorrow pending availability of staff -Continue full vent support -Discussed with both daughters, Alica and [name], regarding tracheostomy on 1/16. They wish to continue aggressive care to give patient time for potential neurologic recovery -Daily WUA and SBT's -VAP bundle -Diurese once to maintain net even goal  Post-op anemia. Stable.   High risk malnutrition--continue tube feeds. Will need to consider PEG in the near future.  Best practice (evaluated daily)  Diet: NPO, tube feeds Pain/Anxiety/Delirium protocol (if indicated): prn fentanyl, delirium precautions VAP protocol (if indicated): Per protocol DVT prophylaxis:  SCDs only  GI prophylaxis: PPI Glucose control: trend, with goal FS 140-180 Mobility: Bedrest Disposition: ICU  Goals of Care:  Last date of multidisciplinary goals of care discussion: 1/16 Family and staff present: both daughters, [name] and [place], PCCM Summary of discussion: Continue full aggressive care Follow up goals of care discussion due: daughters wish to pursue full scope of care (1/16) Code Status: Full   Labs   CBC: Recent Labs  Lab 06/23/20 1015 06/24/20 0500 06/25/20 0506  WBC 14.8* 17.4* 16.0*  HGB 9.3* 10.0* 11.0*  HCT 29.8* 31.8* 34.3*  MCV 89.8 88.6 88.2  PLT 245 299 354    Basic Metabolic Panel: Recent Labs  Lab 06/23/20 1015 06/23/20 2000 06/24/20 0500 06/25/20 0506 06/26/20 0555 06/27/20 0555 06/28/20 0528  NA 140  --  141 139 138 140 138  K 2.9*   < > 3.5 3.4* 3.6 3.4* 4.3  CL 105  --  102 101 102 106 101  CO2 27  --  29 28 27 27 30   GLUCOSE 112*  --  106* 120* 139* 117* 118*  BUN 10  --  10 14 13 16 18   CREATININE 0.56  --  0.64 0.66 0.70 0.65 0.62  CALCIUM 9.6  --  9.8 10.2 10.1 9.8 10.4*  MG 1.5*  --  1.6* 1.9 1.9 1.9  --    < > = values in this interval not displayed.   GFR: Estimated Creatinine Clearance: 88.4 mL/min (by C-G formula based on SCr of 0.62 mg/dL). Recent Labs  Lab 06/23/20 1015 06/24/20 0500 06/25/20 0506  WBC 14.8* 17.4*  16.0*    Liver Function Tests: No results for input(s): AST, ALT, ALKPHOS, BILITOT, PROT, ALBUMIN in the last 168 hours. No results for input(s): LIPASE, AMYLASE in the last 168 hours. Recent Labs  Lab 06/29/20 0406  AMMONIA 18    ABG    Component Value Date/Time   PHART 7.560 (H) 06/19/2020 1513   PCO2ART 31.6 (L) 06/19/2020 1513   PO2ART 138 (H) 06/19/2020 1513   HCO3 28.5 (H) 06/19/2020 1513   TCO2 29 06/19/2020 1513   ACIDBASEDEF 1.0 06/17/2020 0939   O2SAT 99.0 06/19/2020 1513      CBG: Recent Labs  Lab 06/28/20 1556 06/28/20 1918 06/28/20 2326 06/29/20 0323  06/29/20 0746  GLUCAP 116* 115* 107* 119* 122*   The patient is critically ill with multiple organ systems failure and requires high complexity decision making for assessment and support, frequent evaluation and titration of therapies, application of advanced monitoring technologies and extensive interpretation of multiple databases.  Independent Critical Care Time: 32 Minutes.   Mechele Collin, M.D. [facility] Pulmonary/Critical Care Medicine 06/29/2020 10:15 AM   Please see Amion for pager number to reach on-call Pulmonary and Critical Care Team.

## 2020-06-30 LAB — GLUCOSE, CAPILLARY
Glucose-Capillary: 104 mg/dL — ABNORMAL HIGH (ref 70–99)
Glucose-Capillary: 81 mg/dL (ref 70–99)
Glucose-Capillary: 115 mg/dL — ABNORMAL HIGH (ref 70–99)
Glucose-Capillary: 119 mg/dL — ABNORMAL HIGH (ref 70–99)
Glucose-Capillary: 106 mg/dL — ABNORMAL HIGH (ref 70–99)
Glucose-Capillary: 123 mg/dL — ABNORMAL HIGH (ref 70–99)

## 2020-06-30 LAB — PROTIME-INR
INR: 1.1 (ref 0.8–1.2)
Prothrombin Time: 13.8 seconds (ref 11.4–15.2)

## 2020-06-30 LAB — APTT: aPTT: 40 seconds — ABNORMAL HIGH (ref 24–36)

## 2020-06-30 MED ORDER — VALPROATE SODIUM 100 MG/ML IV SOLN
500.0000 mg | Freq: Three times a day (TID) | INTRAVENOUS | Status: DC
  Administered 2020-06-30 – 2020-07-10 (×30): 500 mg via INTRAVENOUS
  Filled 2020-06-30 (×34): qty 5

## 2020-06-30 MED ORDER — VECURONIUM BROMIDE 10 MG IV SOLR
10.0000 mg | Freq: Once | INTRAVENOUS | Status: AC
  Administered 2020-07-01: 10 mg via INTRAVENOUS
  Filled 2020-06-30: qty 10

## 2020-06-30 MED ORDER — PSEUDOEPHEDRINE HCL ER 120 MG PO TB12: 120.0000 mg | ORAL_TABLET | Freq: Two times a day (BID) | ORAL | Status: DC

## 2020-06-30 MED ORDER — PROPOFOL 10 MG/ML IV BOLUS
25.0000 mg | Freq: Once | INTRAVENOUS | Status: AC
  Administered 2020-07-01: 100 mg via INTRAVENOUS
  Filled 2020-06-30: qty 20

## 2020-06-30 MED ORDER — PSEUDOEPHEDRINE HCL 60 MG PO TABS
60.0000 mg | ORAL_TABLET | Freq: Four times a day (QID) | ORAL | Status: DC
  Filled 2020-06-30 (×5): qty 1

## 2020-06-30 MED ORDER — FENTANYL CITRATE (PF) 100 MCG/2ML IJ SOLN
200.0000 ug | Freq: Once | INTRAMUSCULAR | Status: AC
  Administered 2020-07-01: 200 ug via INTRAVENOUS
  Filled 2020-06-30: qty 4

## 2020-06-30 MED ORDER — MIDAZOLAM HCL 2 MG/2ML IJ SOLN
4.0000 mg | Freq: Once | INTRAMUSCULAR | Status: AC
  Administered 2020-07-01: 4 mg via INTRAVENOUS
  Filled 2020-06-30: qty 4

## 2020-06-30 MED ORDER — PSEUDOEPHEDRINE HCL 30 MG PO TABS
60.0000 mg | ORAL_TABLET | Freq: Four times a day (QID) | ORAL | Status: DC
  Administered 2020-06-30 – 2020-07-07 (×27): 60 mg
  Filled 2020-06-30 (×28): qty 2

## 2020-06-30 NOTE — Progress Notes (Signed)
Nutrition Follow-up  DOCUMENTATION CODES:   Obesity unspecified  INTERVENTION:   Tube Feeding via Cortrak tube:  Osmolite 1.2 @ 55 ml/hr (1320 ml) 90 ml ProSource BID  Provides: 1744 kcal, 117 grams protein, 208 grams carbohydrate, and 1070 ml free water.   NUTRITION DIAGNOSIS:   Inadequate oral intake related to inability to eat as evidenced by NPO status. Ongoing.   GOAL:   Patient will meet greater than or equal to 90% of their needs Met with TF.   MONITOR:   TF tolerance  REASON FOR ASSESSMENT:   Ventilator    ASSESSMENT:   Pt with PMH of abdominal hernia admitted with L MCA aneurysm s/p coiling complicated by intraoperative rupture and subsequent severe vasospasm.   Per MD pt passed SBT but unlikely to protect airway and will need prolonged wean from vent, plan for trach and PEG. ENT consulted for trach due to possible goiter.  1/5 cortrak placed; tip gastric  1/7 hemi-craniectomy  Patient is currently intubated on ventilator support MV: 7.7 L/min Temp (24hrs), Avg:99.9 F (37.7 C), Min:98.8 F (37.1 C), Max:100.8 F (38.2 C)   Medications reviewed and include: SSI Neosynephrine @ 10 mcg  Labs reviewed: CBG's: 81-116    Diet Order:   Diet Order            Diet NPO time specified  Diet effective midnight                 EDUCATION NEEDS:   No education needs have been identified at this time  Skin:  Skin Assessment: Reviewed RN Assessment  Last BM:  200 ml via rectal tube  Height:   Ht Readings from Last 1 Encounters:  06/15/20 _0  (1.626 m)    Weight:   Wt Readings from Last 1 Encounters:  06/30/20 103.3 kg    Ideal Body Weight:  54.4 kg  BMI:  Body mass index is 39.09 kg/m.  Estimated Nutritional Needs:   Kcal:  1600-1800  Protein:  105-125 grams  Fluid:  >1.6 L/day  Lockie Pares., RD, LDN, CNSC See AMiON for contact information

## 2020-06-30 NOTE — Consult Note (Signed)
Reason for Consult: Respiratory failure Referring Physician: CCM  Denise Savage is an 60 y.o. female.  HPI: Patient with a recent craniotomy and subarachnoid hemorrhage.  Now is with respiratory failure and persistent intubation.  The critical care service has requested a evaluation of a tracheotomy.  This is felt the patient had a goiter that was not amendable to a percutaneous tracheotomy.  Past Medical History:  Diagnosis Date  . Abdominal hernia     Past Surgical History:  Procedure Laterality Date  . CRANIOTOMY Left 06/19/2020   Procedure: CRANIOTOMY HEMATOMA EVACUATION SUBDURAL;  Surgeon: Jadene Pierini, MD;  Location: [facility] OR;  Service: Neurosurgery;  Laterality: Left;  . IR ANGIO INTRA EXTRACRAN SEL INTERNAL CAROTID BILAT MOD SED  06/16/2020  . IR ANGIO VERTEBRAL SEL VERTEBRAL UNI R MOD SED  06/16/2020  . IR ANGIOGRAM FOLLOW UP STUDY  06/16/2020  . IR ANGIOGRAM FOLLOW UP STUDY  06/16/2020  . IR ANGIOGRAM FOLLOW UP STUDY  06/16/2020  . IR CT HEAD LTD  06/16/2020  . IR NEURO EACH ADD'L AFTER BASIC UNI RIGHT (MS)  06/16/2020  . IR TRANSCATH/EMBOLIZ  06/16/2020  . IR US GUIDE VASC ACCESS RIGHT  06/16/2020  . RADIOLOGY WITH ANESTHESIA N/A 06/16/2020   Procedure: IR WITH ANESTHESIA;  Surgeon: Lisbeth Renshaw, MD;  Location: [facility] OR;  Service: Radiology;  Laterality: N/A;  . TUBAL LIGATION    . VENTRAL HERNIA REPAIR N/A 12/29/2014   Procedure: LAPAROSCOPIC VENTRAL HERNIA WITH MESH;  Surgeon: Franky Macho, MD;  Location: [facility]S;  Service: General;  Laterality: N/A;    History reviewed. No pertinent family history.  Social History:  reports that she has never smoked. She has never used smokeless tobacco. She reports that she does not drink alcohol and does not use drugs.  Allergies:  Allergies  Allergen Reactions  . Hydrocodone Nausea Only    Medications: I have reviewed the patient's current medications.  Results for orders placed or performed during the hospital encounter of 06/15/20 (from  the past 48 hour(s))  Glucose, capillary     Status: Abnormal   Collection Time: 06/28/20 11:15 AM  Result Value Ref Range   Glucose-Capillary 117 (H) 70 - 99 mg/dL    Comment: Glucose reference range applies only to samples taken after fasting for at least 8 hours.  Glucose, capillary     Status: Abnormal   Collection Time: 06/28/20  3:56 PM  Result Value Ref Range   Glucose-Capillary 116 (H) 70 - 99 mg/dL    Comment: Glucose reference range applies only to samples taken after fasting for at least 8 hours.  Glucose, capillary     Status: Abnormal   Collection Time: 06/28/20  7:18 PM  Result Value Ref Range   Glucose-Capillary 115 (H) 70 - 99 mg/dL    Comment: Glucose reference range applies only to samples taken after fasting for at least 8 hours.  Glucose, capillary     Status: Abnormal   Collection Time: 06/28/20 11:26 PM  Result Value Ref Range   Glucose-Capillary 107 (H) 70 - 99 mg/dL    Comment: Glucose reference range applies only to samples taken after fasting for at least 8 hours.  Glucose, capillary     Status: Abnormal   Collection Time: 06/29/20  3:23 AM  Result Value Ref Range   Glucose-Capillary 119 (H) 70 - 99 mg/dL    Comment: Glucose reference range applies only to samples taken after fasting for at least 8 hours.  Ammonia  Status: None   Collection Time: 06/29/20  4:06 AM  Result Value Ref Range   Ammonia 18 9 - 35 umol/L    Comment: Performed at [facility], [address] [place], [place] [address]  Glucose, capillary     Status: Abnormal   Collection Time: 06/29/20  7:46 AM  Result Value Ref Range   Glucose-Capillary 122 (H) 70 - 99 mg/dL    Comment: Glucose reference range applies only to samples taken after fasting for at least 8 hours.  Glucose, capillary     Status: Abnormal   Collection Time: 06/29/20 11:16 AM  Result Value Ref Range   Glucose-Capillary 131 (H) 70 - 99 mg/dL    Comment: Glucose reference range applies only to samples taken  after fasting for at least 8 hours.  Glucose, capillary     Status: Abnormal   Collection Time: 06/29/20  3:15 PM  Result Value Ref Range   Glucose-Capillary 116 (H) 70 - 99 mg/dL    Comment: Glucose reference range applies only to samples taken after fasting for at least 8 hours.  Glucose, capillary     Status: Abnormal   Collection Time: 06/29/20  7:16 PM  Result Value Ref Range   Glucose-Capillary 101 (H) 70 - 99 mg/dL    Comment: Glucose reference range applies only to samples taken after fasting for at least 8 hours.  Glucose, capillary     Status: Abnormal   Collection Time: 06/29/20 11:12 PM  Result Value Ref Range   Glucose-Capillary 107 (H) 70 - 99 mg/dL    Comment: Glucose reference range applies only to samples taken after fasting for at least 8 hours.  Glucose, capillary     Status: Abnormal   Collection Time: 06/30/20  3:06 AM  Result Value Ref Range   Glucose-Capillary 104 (H) 70 - 99 mg/dL    Comment: Glucose reference range applies only to samples taken after fasting for at least 8 hours.  Glucose, capillary     Status: None   Collection Time: 06/30/20  7:37 AM  Result Value Ref Range   Glucose-Capillary 81 70 - 99 mg/dL    Comment: Glucose reference range applies only to samples taken after fasting for at least 8 hours.    No results found.  ROS Blood pressure 105/60, pulse 91, temperature 99.8 F (37.7 C), temperature source Axillary, resp. rate (!) 26, height 5\' 4"  (1.626 m), weight 103.3 kg, SpO2 100 %. Physical Exam HENT:     Head:     Comments: Intubated.  No evidence of any lesions or issues with the neck.  Palpation of the neck reveals the anatomy of the larynx is palpable and seems to be midline.      Assessment/Plan: Respiratory failure-I was called regarding a tracheotomy.  It was felt that there was a large goiter that precluded a percutaneous trach.  The recent CT arteriogram does not indicate that there is a goiter.  There is a small nodule of  4 mm but the isthmus area is not involved.  I talked to the pulmonary critical care regarding the percutaneous and they are going to come examine the patient and determine if they can perform the percutaneous trach.  06/30/2020, 9:32 AM

## 2020-06-30 NOTE — Progress Notes (Signed)
NAME:  SOLA MARGOLIS, MRN:  [id number], DOB:  08/10/1960, LOS: 14 ADMISSION DATE:  06/15/2020, CONSULTATION DATE:  06/16/20 REFERRING MD:  Dr. Maurice Small, CHIEF COMPLAINT:  VDRF   Brief History:  60 year old female presented headache x 5 days found to have left sylvian fissure subarachnoid hemorrhage with a left MCA bifurcation aneurysm.  Transferred to [facility] for further care and went for diagnostic angiography with coiling, complicated by intraoperative rupture and subsequent severe vasospasm leading to malignant left MCA stroke.  She returned to the ICU intubated.  Past Medical History:   has a past medical history of Abdominal hernia.  Significant Hospital Events:  1/3 present to [facility] emergency department 1/4 transferred to [facility], NSGY admitting/ taken to neuro IR for coiling.  Remained on vent postop 1/5 more lethargic and unresponsive, stat head CT was done which showed progressive left MCA infarct with brain edema and 6 mm midline shift.  Persistent nonprogressive subarachnoid hemorrhage. 1/6 worsening edema on CT, 3% saline continued 1/7 Keppra increased for subclinical seizures on LTM EEG 1/8 Low-grade febrile 100.9 1/12 modafinil started  Consults:  Neuro IR PCCM  Procedures:  1/4 diagnostic angiogram with coiling 1/7 left hemicraniectomy 1/7 R IJ central line  Significant Diagnostic Tests:  1/3 >> CT angiogram head neck > small volume acute subarachnoid hemorrhage centered at the base of the left sylvian fissure.  6 x 6 x 9 mm irregular aneurysm arising from the left MCA bifurcation.  1/5 CTH >> Diffused and partially reabsorbed subarachnoid hemorrhage since yesterday. 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  1/6 CTH >> Progressive swelling of the left MCA infarct with new midline shift measuring 6 mm. Nonprogressive subarachnoid hemorrhage. No evidence of new territory infarct  1/6 CTH  >> increasing midline shift 8 mm  1/6 >> LTM EEG evidence  of epileptogenicity arising from right parietal region , no seizures  1/8 LTM EEG >> evidence ofcortical dysfunctionand epileptogenicity in left frontal region consistent with underlying craniotomy andstructural abnormality. After propofol was stopped, eeg showed periodic epileptiform discharges at 1-1.5hz  In left frontal region which were intermittent and rhythmic which is on the ictal-interictal continuum , worse compared to 1/7  1/9 LTM EEG similar to 1/8  1/10 echocardiogram>>EF 60-65%. No valvular abnormalities. Greater than 50% respiratory variability of IVC  Micro Data:  1/3 SARS/ flu >>neg 1/4 MRSA  >> neg 1/8 resp >> ng  Antimicrobials:  Unasyn 1/8>>1/12  Interim History / Subjective:  Seen lying in bed No acute issues overnight  Withdrawals to pain  Tolerating SB currrnently  Objective   Blood pressure (!) 113/59, pulse 89, temperature 99.6 F (37.6 C), temperature source Axillary, resp. rate 19, height 5\' 4"  (1.626 m), weight 103.3 kg, SpO2 98 %.    Vent Mode: PRVC FiO2 (%):  [30 %] 30 % Set Rate:  [16 bmp] 16 bmp Vt Set:  [430 mL] 430 mL PEEP:  [5 cmH20] 5 cmH20 Pressure Support:  [5 cmH20] 5 cmH20 Plateau Pressure:  [16 cmH20-18 cmH20] 18 cmH20   Intake/Output Summary (Last 24 hours) at 06/30/2020 0727 Last data filed at 06/30/2020 0600 Gross per 24 hour  Intake 772.52 ml  Output 2080 ml  Net -1307.48 ml   Filed Weights   06/29/20 0500 06/29/20 1050 06/30/20 0500  Weight: 105.3 kg 105 kg 103.3 kg   Physical Exam: General: Acute on chronic ill appearing middle aged female lying in bed on mechanical ventilation, in NAD HEENT: ETT, MM pink/moist, PERRL,  Neuro: Will withdrawal to pain, unable to follow simple commands  CV: s1s2 regular rate and rhythm, no murmur, rubs, or gallops,  PULM:  Clear to ascultation bilaterally, no added breath sounds, no increased WOB on vent, tolerating SBT current;y  GI: soft, bowel sounds active in all 4 quadrants,  non-tender, non-distended, tolerating TF Extremities: warm/dry, no edema  Skin: no rashes or lesions  Resolved Hospital Problem list   Aspiration pneumonia--received 5d course of unasyn 1/8-1/12  Assessment & Plan:   Aneurysmal subarachnoid hemorrhage due to ruptured left MCA aneurysm s/p coiling.  -Hunt & Hess score 1, Fisher grade 1. 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 Still not following commands consistently on the left -remains on low-dose vasopressor P: Management per neurology  Maintain neuro protective measures; goal for eurothermia, euglycemia, eunatermia, normoxia, and PCO2 goal of 35-40 Nutrition and bowel regiment  Seizure precautions  AEDs per neurology  Aspirations precautions  Remains on Nimodipine  PO Delirium precautions  Remains on Modafinil and Melatonin to help regulate sleep cycle   Hypotension due to medication side effect  -Continues to remain on pressors due to nimodipine administration P: Wean pressors as able  MAP goal > 65 Continue Midodrine  Critical illness requiring mechanical ventilation  -secondary to above. Tolerates pressure support. Her mental status is still precluding extubation. She has seemed to respond well to the modafinil in terms of being more awake but is still not consistently following commands P: Patient will likely need prolonged vent support and extended wean  Bedside assessment for trach placement revealed questionable goiter limiting bedside trach success  Consult ENT today  Continue ventilator support with lung protective strategies  Wean PEEP and FiO2 for sats greater than 90%. Head of bed elevated 30 degrees. Plateau pressures less than 30 cm H20 Follow intermittent chest x-ray and ABG SAT/SBT as tolerated, mentation preclude extubation  Ensure adequate pulmonary hygiene  Follow cultures  VAP bundle in place   PAD protocol  Post-op anemia  P: Trend CBC intermittently  Counts remains stable    High risk malnutrition P: Continue TFs Dietary following  Protein supplementation  Will need PEG placement in the near future   Best practice (evaluated daily)  Diet: NPO, tube feeds Pain/Anxiety/Delirium protocol (if indicated): prn fentanyl, delirium precautions VAP protocol (if indicated): Per protocol DVT prophylaxis: SCDs only  GI prophylaxis: PPI Glucose control: trend, with goal FS 140-180 Mobility: Bedrest Disposition: ICU  Goals of Care:  Last date of multidisciplinary goals of care discussion: 1/16 Family and staff present: both daughters, [name] and Jackson, PCCM Summary of discussion: Continue full aggressive care Follow up goals of care discussion due: daughters wish to pursue full scope of care (1/16) including tracheostomy placement  Code Status: Full   Labs   CBC: Recent Labs  Lab 06/23/20 1015 06/24/20 0500 06/25/20 0506  WBC 14.8* 17.4* 16.0*  HGB 9.3* 10.0* 11.0*  HCT 29.8* 31.8* 34.3*  MCV 89.8 88.6 88.2  PLT 245 299 354    Basic Metabolic Panel: Recent Labs  Lab 06/23/20 1015 06/23/20 2000 06/24/20 0500 06/25/20 0506 06/26/20 0555 06/27/20 0555 06/28/20 0528  NA 140  --  141 139 138 140 138  K 2.9*   < > 3.5 3.4* 3.6 3.4* 4.3  CL 105  --  102 101 102 106 101  CO2 27  --  29 28 27 27 30   GLUCOSE 112*  --  106* 120* 139* 117* 118*  BUN 10  --  10 14 13 16 18   CREATININE 0.56  --  0.64 0.66 0.70 0.65 0.62  CALCIUM 9.6  --  9.8 10.2 10.1 9.8 10.4*  MG 1.5*  --  1.6* 1.9 1.9 1.9  --    < > = values in this interval not displayed.   GFR: Estimated Creatinine Clearance: 87.5 mL/min (by C-G formula based on SCr of 0.62 mg/dL). Recent Labs  Lab 06/23/20 1015 06/24/20 0500 06/25/20 0506  WBC 14.8* 17.4* 16.0*    Liver Function Tests: No results for input(s): AST, ALT, ALKPHOS, BILITOT, PROT, ALBUMIN in the last 168 hours. No results for input(s):  LIPASE, AMYLASE in the last 168 hours. Recent Labs  Lab 06/29/20 0406  AMMONIA 18    ABG    Component Value Date/Time   PHART 7.560 (H) 06/19/2020 1513   PCO2ART 31.6 (L) 06/19/2020 1513   PO2ART 138 (H) 06/19/2020 1513   HCO3 28.5 (H) 06/19/2020 1513   TCO2 29 06/19/2020 1513   ACIDBASEDEF 1.0 06/17/2020 0939   O2SAT 99.0 06/19/2020 1513      CBG: Recent Labs  Lab 06/29/20 1116 06/29/20 1515 06/29/20 1916 06/29/20 2312 06/30/20 0306  GLUCAP 131* 116* 101* 107* 104*   CRITICAL CARE Performed by: 07/02/20  Total critical care time: 35 minutes  Critical care time was exclusive of separately billable procedures and treating other patients.  Critical care was necessary to treat or prevent imminent or life-threatening deterioration.  Critical care was time spent personally by me on the following activities: development of treatment plan with patient and/or surrogate as well as nursing, discussions with consultants, evaluation of patient's response to treatment, examination of patient, obtaining history from patient or surrogate, ordering and performing treatments and interventions, ordering and review of laboratory studies, ordering and review of radiographic studies, pulse oximetry and re-evaluation of patient's condition.  Delfin Gant, NP-C [facility] Contact / Pager information can be found on Amion  06/30/2020, 8:13 AM

## 2020-06-30 NOTE — Progress Notes (Signed)
PCCM progress note  ENT consult called, this writer spoke to Dr, Marjie Skiff concerning need for ENT consult for non-urgent tracheostomy placement.   Delfin Gant, NP-C [facility] Contact / Pager information can be found on Amion  06/30/2020, 8:51 AM

## 2020-06-30 NOTE — Progress Notes (Signed)
  NEUROSURGERY PROGRESS NOTE   No issues overnight.   EXAM:  BP (!) 113/59   Pulse 89   Temp 99.6 F (37.6 C) (Axillary)   Resp 19   Ht 5\' 4"  (1.626 m)   Wt 103.3 kg   SpO2 98%   BMI 39.09 kg/m   Intubated Opens eyes to pain Pupils reactive bilaterally Not following commands Purposeful LUE.Moves LLE to central pain No movement RUE/RLE Crani site: c/d/i abd incision: c/d/i  IMPRESSION/PLAN 60 y.o. female POD13coiling ruptured LMCA complicated by intraop rupture, left MCA infarct POD11left hemicraniectomy.  Subclinical seizures - continue supportive care, nimotop  -seizures:continue Keppra and Depakote per neuro -vent per CCM.Unable to do bedside trach. ENT to be consulted.

## 2020-07-01 ENCOUNTER — Encounter (HOSPITAL_COMMUNITY): Payer: Self-pay | Admitting: Pulmonary Disease

## 2020-07-01 ENCOUNTER — Encounter (HOSPITAL_COMMUNITY)
Admission: EM | Disposition: A | Discharge status: Skilled Nursing Facility | Payer: Self-pay | Source: Home / Self Care | Attending: Neurosurgery

## 2020-07-01 HISTORY — PX: PEG PLACEMENT: SHX5437

## 2020-07-01 HISTORY — PX: ESOPHAGOGASTRODUODENOSCOPY: SHX5428

## 2020-07-01 LAB — CBC
HCT: 25.1 % — ABNORMAL LOW (ref 36.0–46.0)
Hemoglobin: 8 g/dL — ABNORMAL LOW (ref 12.0–15.0)
MCH: 29 pg (ref 26.0–34.0)
MCHC: 31.9 g/dL (ref 30.0–36.0)
MCV: 90.9 fL (ref 80.0–100.0)
Platelets: 270 10*3/uL (ref 150–400)
RBC: 2.76 MIL/uL — ABNORMAL LOW (ref 3.87–5.11)
RDW: 15.5 % (ref 11.5–15.5)
WBC: 16.8 10*3/uL — ABNORMAL HIGH (ref 4.0–10.5)
nRBC: 0 % (ref 0.0–0.2)

## 2020-07-01 LAB — BASIC METABOLIC PANEL
Anion gap: 7 (ref 5–15)
BUN: 22 mg/dL — ABNORMAL HIGH (ref 6–20)
CO2: 31 mmol/L (ref 22–32)
Calcium: 10.6 mg/dL — ABNORMAL HIGH (ref 8.9–10.3)
Chloride: 97 mmol/L — ABNORMAL LOW (ref 98–111)
Creatinine, Ser: 0.8 mg/dL (ref 0.44–1.00)
GFR, Estimated: >60 mL/min (ref >60)
Glucose, Bld: 97 mg/dL (ref 70–99)
Potassium: 4.1 mmol/L (ref 3.5–5.1)
Sodium: 135 mmol/L (ref 135–145)

## 2020-07-01 LAB — GLUCOSE, CAPILLARY
Glucose-Capillary: 91 mg/dL (ref 70–99)
Glucose-Capillary: 86 mg/dL (ref 70–99)
Glucose-Capillary: 84 mg/dL (ref 70–99)
Glucose-Capillary: 83 mg/dL (ref 70–99)
Glucose-Capillary: 87 mg/dL (ref 70–99)
Glucose-Capillary: 121 mg/dL — ABNORMAL HIGH (ref 70–99)

## 2020-07-01 SURGERY — EGD (ESOPHAGOGASTRODUODENOSCOPY)
Anesthesia: Moderate Sedation

## 2020-07-01 MED ORDER — MIDAZOLAM HCL 2 MG/2ML IJ SOLN: 10.0000 mg | Freq: Once | INTRAMUSCULAR | Status: DC

## 2020-07-01 MED ORDER — NIMODIPINE 6 MG/ML PO SOLN
60.0000 mg | ORAL | Status: DC
  Administered 2020-07-01: 60 mg
  Filled 2020-07-01: qty 10

## 2020-07-01 MED ORDER — FENTANYL CITRATE (PF) 100 MCG/2ML IJ SOLN
100.0000 ug | Freq: Once | INTRAMUSCULAR | Status: AC
  Administered 2020-07-01: 100 ug via INTRAVENOUS
  Filled 2020-07-01: qty 2

## 2020-07-01 MED ORDER — MIDAZOLAM HCL 2 MG/2ML IJ SOLN
4.0000 mg | Freq: Once | INTRAMUSCULAR | Status: AC
  Administered 2020-07-01: 2 mg via INTRAVENOUS
  Filled 2020-07-01: qty 4

## 2020-07-01 MED ORDER — PHENYLEPHRINE 40 MCG/ML (10ML) SYRINGE FOR IV PUSH (FOR BLOOD PRESSURE SUPPORT)
PREFILLED_SYRINGE | INTRAVENOUS | Status: AC
  Filled 2020-07-01: qty 10

## 2020-07-01 MED ORDER — MIDAZOLAM HCL 10 MG/2ML IJ SOLN
10.0000 mg | Freq: Once | INTRAMUSCULAR | Status: DC
  Filled 2020-07-01: qty 2

## 2020-07-01 MED ORDER — NIMODIPINE 6 MG/ML PO SOLN
60.0000 mg | ORAL | Status: DC
  Administered 2020-07-01 – 2020-07-03 (×12): 60 mg
  Filled 2020-07-01 (×12): qty 10

## 2020-07-01 NOTE — Procedures (Signed)
Bedside Tracheostomy Insertion Procedure Note   Patient Details:   Name: Denise Savage DOB: [birth year] MRN: [id number]  Procedure: Tracheostomy  Pre Procedure Assessment: ET Tube Size: 7.5 ET Tube secured at lip (cm): 23 Bite block in place: No Breath Sounds: Rhonch  Post Procedure Assessment: BP 112/66   Pulse 79   Temp (!) 97.5 F (36.4 C) (Axillary)   Resp 15   Ht 5\' 4"  (1.626 m)   Wt 103.5 kg   SpO2 100%   BMI 39.17 kg/m  O2 sats: stable throughout Complications: No apparent complications Patient did tolerate procedure well Tracheostomy Brand:Shiley Tracheostomy Style:Cuffed Tracheostomy Size: 6 Tracheostomy Secured , velcro Tracheostomy Placement Confirmation:Trach cuff visualized and in place and Chest X ray ordered for placement    PVX:[id number] 07/01/2020, 2:46 PM

## 2020-07-01 NOTE — Op Note (Signed)
   Procedure Note  Date: 07/01/2020  Procedure: esophagogastroduodenoscopy (EGD) and percutaneous endoscopic gastrostomy (PEG) tube placement  Pre-op diagnosis: dysphagia Post-op diagnosis: same  Indication and clinical history: 3F with prolonged mechanical ventilation and dysphagia  Surgeon: Jesusita Oka, MD Assistant: Maxwell Caul, [place]  Anesthesia: MAC  Findings:  Specimen: none EBL: <5cc Drains/Implants: PEG tube,  6 cm at the skin   Disposition: ICU/PACU  Description of Procedure: The patient was positioned semi-recumbent. Time-out was performed verifying correct patient, procedure, signature of informed consent, and pre-operative antibiotics as indicated. MAC induction was uneventful and a bite block was placed into the oropharynx. The endoscope was inserted into the oropharynx and advanced down the esophagus into the stomach and into the duodenum. The visualized esophagus and duodenum were unremarkable. The endoscope was retracted back into the stomach and the stomach was insufflated. The stomach was inspected and was also normal. Transillumination was performed. The light was visible on the external skin and dimpling of the stomach was noted endoscopically with manual pressure. The abdomen was prepped and draped in the usual sterile fashion. Transillumination and dimpling were repeated and local anesthetic was infiltrated to make a skin wheal at the site of transillumination. The needle was inserted perpendicularly to the skin and the tip of the needle was visualized endoscopically. As the needle was retracted, the tract was also anesthetized. A skin nick was made at the site of the wheal and an introducer needle and sheath were inserted. The needle was removed and guidewire inserted. The guidewire was grasped by an endoscopic snare and the snare, guidewire, and endoscope retracted out of the oropharynx. The PEG tube was secured to the guidewire and retracted through the mouth and  esophagus into the stomach. The PEG tube was secured with a bolster and was visualized endoscopically to spin freely circumferentially and also be without gaps between the internal bumper and the stomach wall. There was no evidence of bleeding. The PEG bolster was secured at  6 cm at the skin and there were no gaps between the bolster and the abdominal wall. The stomach was desufflated endoscopically and the endoscope removed. The bite block was also removed. The patient tolerated the procedure well and there were no complications.   The patient may have water and medications administered via the PEG tube beginning immediately and tube feeds may be initiated four hours post-procedure.    Jesusita Oka, MD [facility]

## 2020-07-01 NOTE — Progress Notes (Signed)
NAME:  Denise Savage, MRN:  [id number], DOB:  07/31/1960, LOS: 15 ADMISSION DATE:  06/15/2020, CONSULTATION DATE:  06/16/20 REFERRING MD:  Dr. Maurice Small, CHIEF COMPLAINT:  VDRF   Brief History:  60 year old female presented headache x 5 days found to have left sylvian fissure subarachnoid hemorrhage with a left MCA bifurcation aneurysm.  Transferred to [facility] for further care and went for diagnostic angiography with coiling, complicated by intraoperative rupture and subsequent severe vasospasm leading to malignant left MCA stroke.  She returned to the ICU intubated.  Past Medical History:   has a past medical history of Abdominal hernia.  Significant Hospital Events:  1/3 present to [facility] emergency department 1/4 transferred to [facility], NSGY admitting/ taken to neuro IR for coiling.  Remained on vent postop 1/5 more lethargic and unresponsive, stat head CT was done which showed progressive left MCA infarct with brain edema and 6 mm midline shift.  Persistent nonprogressive subarachnoid hemorrhage. 1/6 worsening edema on CT, 3% saline continued 1/7 Keppra increased for subclinical seizures on LTM EEG 1/8 Low-grade febrile 100.9 1/12 modafinil started  Consults:  Neuro IR PCCM  Procedures:  1/4 diagnostic angiogram with coiling 1/7 left hemicraniectomy 1/7 R IJ central line  Significant Diagnostic Tests:  1/3 >> CT angiogram head neck > small volume acute subarachnoid hemorrhage centered at the base of the left sylvian fissure.  6 x 6 x 9 mm irregular aneurysm arising from the left MCA bifurcation.  1/5 CTH >> Diffused and partially reabsorbed subarachnoid hemorrhage since yesterday. 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  1/6 CTH >> Progressive swelling of the left MCA infarct with new midline shift measuring 6 mm. Nonprogressive subarachnoid hemorrhage. No evidence of new territory infarct  1/6 CTH  >> increasing midline shift 8 mm  1/6 >> LTM EEG evidence  of epileptogenicity arising from right parietal region , no seizures  1/8 LTM EEG >> evidence ofcortical dysfunctionand epileptogenicity in left frontal region consistent with underlying craniotomy andstructural abnormality. After propofol was stopped, eeg showed periodic epileptiform discharges at 1-1.5hz  In left frontal region which were intermittent and rhythmic which is on the ictal-interictal continuum , worse compared to 1/7  1/9 LTM EEG similar to 1/8  1/10 echocardiogram>>EF 60-65%. No valvular abnormalities. Greater than 50% respiratory variability of IVC  Micro Data:  1/3 SARS/ flu >>neg 1/4 MRSA  >> neg 1/8 resp >> ng  Antimicrobials:  Unasyn 1/8>>1/12  Interim History / Subjective:  Intermittently awake and moving left side. Not following commands.  Objective   Blood pressure 112/66, pulse 79, temperature (!) 97.5 F (36.4 C), temperature source Axillary, resp. rate 15, height 5\' 4"  (1.626 m), weight 103.5 kg, SpO2 100 %.    Vent Mode: PRVC FiO2 (%):  [30 %] 30 % Set Rate:  [15 bmp-16 bmp] 15 bmp Vt Set:  [430 mL] 430 mL PEEP:  [5 cmH20] 5 cmH20 Plateau Pressure:  [14 cmH20-20 cmH20] 19 cmH20   Intake/Output Summary (Last 24 hours) at 07/01/2020 1448 Last data filed at 07/01/2020 0600 Gross per 24 hour  Intake 957.48 ml  Output 1075 ml  Net -117.52 ml   Filed Weights   06/29/20 1050 06/30/20 0500 07/01/20 0500  Weight: 105 kg 103.3 kg 103.5 kg   Physical Exam: General: Acute on chronic ill appearing middle aged female lying in bed on mechanical ventilation, in NAD HEENT: ETT, MM pink/moist, PERRL,  Neuro: Will withdrawal to pain, unable to follow simple commands  CV: s1s2  regular rate and rhythm, no murmur, rubs, or gallops,  PULM:  Clear to ascultation bilaterally, no added breath sounds, no increased WOB on vent, tolerating SBT current;y  GI: soft, bowel sounds active in all 4 quadrants, non-tender, non-distended, tolerating TF Extremities: warm/dry, no  edema  Skin: no rashes or lesions  Resolved Hospital Problem list   Aspiration pneumonia--received 5d course of unasyn 1/8-1/12  Assessment & Plan:   Aneurysmal subarachnoid hemorrhage due to ruptured left MCA aneurysm s/p coiling.  -Hunt & Hess score 1, Fisher grade 1. 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 Still not following commands consistently on the left - for trach and PEG today. - Liberalize BP goals. Tolerate MAP >65.    Critical illness requiring mechanical ventilation  -secondary to above. Tolerates pressure support. Her mental status is still precluding extubation. She has seemed to respond well to the modafinil in terms of being more awake but is still not consistently following commands - trach today  - initiate trach collar trials  Post-op anemia  -Trend CBC intermittently  -Counts remains stable    Best practice (evaluated daily)  Diet: NPO, tube feeds Pain/Anxiety/Delirium protocol (if indicated): prn fentanyl, delirium precautions VAP protocol (if indicated): Per protocol DVT prophylaxis: SCDs only  GI prophylaxis: PPI Glucose control: trend, with goal FS 140-180 Mobility: Bedrest Disposition: ICU  Goals of Care:  Last date of multidisciplinary goals of care discussion: 1/16 Family and staff present: both daughters, [name] and [place], PCCM Summary of discussion: Continue full aggressive care Follow up goals of care discussion due: daughters wish to pursue full scope of care (1/16) including tracheostomy placement  Code Status: Full   Labs   CBC: Recent Labs  Lab 06/25/20 0506 07/01/20 0422  WBC 16.0* 16.8*  HGB 11.0* 8.0*  HCT 34.3* 25.1*  MCV 88.2 90.9  PLT 354 270    Basic Metabolic Panel: Recent Labs  Lab 06/25/20 0506 06/26/20 0555 06/27/20 0555 06/28/20 0528 07/01/20 0422  NA 139 138 140 138 135  K 3.4* 3.6 3.4* 4.3 4.1  CL 101  102 106 101 97*  CO2 28 27 27 30 31   GLUCOSE 120* 139* 117* 118* 97  BUN 14 13 16 18  22*  CREATININE 0.66 0.70 0.65 0.62 0.80  CALCIUM 10.2 10.1 9.8 10.4* 10.6*  MG 1.9 1.9 1.9  --   --    GFR: Estimated Creatinine Clearance: 87.6 mL/min (by C-G formula based on SCr of 0.8 mg/dL). Recent Labs  Lab 06/25/20 0506 07/01/20 0422  WBC 16.0* 16.8*    Liver Function Tests: No results for input(s): AST, ALT, ALKPHOS, BILITOT, PROT, ALBUMIN in the last 168 hours. No results for input(s): LIPASE, AMYLASE in the last 168 hours. Recent Labs  Lab 06/29/20 0406  AMMONIA 18    ABG    Component Value Date/Time   PHART 7.560 (H) 06/19/2020 1513   PCO2ART 31.6 (L) 06/19/2020 1513   PO2ART 138 (H) 06/19/2020 1513   HCO3 28.5 (H) 06/19/2020 1513   TCO2 29 06/19/2020 1513   ACIDBASEDEF 1.0 06/17/2020 0939   O2SAT 99.0 06/19/2020 1513      CBG: Recent Labs  Lab 06/30/20 1916 06/30/20 2311 07/01/20 0327 07/01/20 0741 07/01/20 1124  GLUCAP 106* 123* 91 86 84   CRITICAL CARE Performed by: 07/03/20  Total critical care time: 35 minutes  Critical care time was exclusive of separately billable procedures and treating  other patients.  Critical care was necessary to treat or prevent imminent or life-threatening deterioration.  Critical care was time spent personally by me on the following activities: development of treatment plan with patient and/or surrogate as well as nursing, discussions with consultants, evaluation of patient's response to treatment, examination of patient, obtaining history from patient or surrogate, ordering and performing treatments and interventions, ordering and review of laboratory studies, ordering and review of radiographic studies, pulse oximetry and re-evaluation of patient's condition.  Lynnell Catalan, MD [facility] ICU Physician [facility] [place] [facility]  Pager: [phone] Or Epic Secure Chat After hours: [phone].  07/01/2020, 2:51  PM      07/01/2020, 2:48 PM

## 2020-07-01 NOTE — Op Note (Signed)
[facility] Patient Name: Denise Savage Procedure Date : 07/01/2020 MRN: [id number] Attending MD: Diamantina Monks ,  Date of Birth: August 17, 1960 CSN: [id number] Age: 60 Admit Type: Inpatient Procedure:                Upper GI endoscopy Indications:              Dysphagia Providers:                Albin Felling, Technician Referring MD:              Medicines:                Fentanyl 100 micrograms IV, Midazolam 4 mg IV Complications:            No immediate complications. Estimated blood loss:                            Minimal. Estimated Blood Loss:     Estimated blood loss was minimal. Procedure:                After obtaining informed consent, the endoscope was                            passed under direct vision. Throughout the                            procedure, the patient's blood pressure, pulse, and                            oxygen saturations were monitored continuously. The                            GIF-H190 ([id number]) Olympus gastroscope was                            introduced through the mouth, and advanced to the                            duodenal bulb. The upper GI endoscopy was                            accomplished without difficulty. The patient                            tolerated the procedure well. Scope In: Scope Out: Findings:      The esophagus was normal.      The stomach was normal.      The examined duodenum was normal. Impression:               - Normal esophagus.                           - Normal stomach.                           - Normal examined duodenum.                           -  No specimens collected. Recommendation:           - May administer water and medications now and may                            start tube feeds four hours post-procedure. Procedure Code(s):        --- Professional ---                           [phone], Esophagogastroduodenoscopy, flexible,                            transoral;  diagnostic, including collection of                            specimen(s) by brushing or washing, when performed                            (separate procedure) Diagnosis Code(s):        --- Professional ---                           R13.10, Dysphagia, unspecified CPT copyright 2019 American Medical Association. All rights reserved. The codes documented in this report are preliminary and upon coder review may  be revised to meet current compliance requirements. Diamantina Monks,  07/01/2020 4:21:53 PM Number of Addenda: 0

## 2020-07-01 NOTE — Procedures (Signed)
Diagnostic Bronchoscopy  Denise Savage  [id number]  1961-06-03  Date:07/01/20  Time:2:34 PM   Provider Performing:Daemien Fronczak   Procedure: Diagnostic Bronchoscopy (23343)  Indication(s) Assist with direct visualization of tracheostomy placement  Consent Risks of the procedure as well as the alternatives and risks of each were explained to the patient and/or caregiver.  Consent for the procedure was obtained.   Anesthesia See separate tracheostomy note   Time Out Verified patient identification, verified procedure, site/side was marked, verified correct patient position, special equipment/implants available, medications/allergies/relevant history reviewed, required imaging and test results available.   Sterile Technique Usual hand hygiene, masks, gowns, and gloves were used   Procedure Description Bronchoscope advanced through endotracheal tube and into airway.  After suctioning out tracheal secretions, bronchoscope used to provide direct visualization of tracheostomy placement.   Complications/Tolerance None; patient tolerated the procedure well.   EBL None  Specimen(s) None

## 2020-07-01 NOTE — Consult Note (Signed)
Denise Savage 1961-05-24  [id number].    Requesting MD: Dr. Lisbeth Renshaw Chief Complaint/Reason for Consult: VDRF, dysphagia  HPI:  This is a 60 yo female who was admitted secondary to a SAH with a left MCA bifurcation aneurysm.  During diagnostic angio with coiling, she had an intraoperative rupture with severe vasospasm causing left MCA stroke.  The patient then underwent a left hemicraniectomy by Dr. Conchita Paris.  The patient has remained on the ventilator.  She is undergoing trach today by CCM and we have been asked to see her for PEG tube placement.   Daughter confirmed UHR with mesh in last couple of years.  ROS: ROS: unable, on vent  History reviewed. No pertinent family history.  Past Medical History:  Diagnosis Date  . Abdominal hernia     Past Surgical History:  Procedure Laterality Date  . CRANIOTOMY Left 06/19/2020   Procedure: CRANIOTOMY HEMATOMA EVACUATION SUBDURAL;  Surgeon: Jadene Pierini, MD;  Location: [facility] OR;  Service: Neurosurgery;  Laterality: Left;  . IR ANGIO INTRA EXTRACRAN SEL INTERNAL CAROTID BILAT MOD SED  06/16/2020  . IR ANGIO VERTEBRAL SEL VERTEBRAL UNI R MOD SED  06/16/2020  . IR ANGIOGRAM FOLLOW UP STUDY  06/16/2020  . IR ANGIOGRAM FOLLOW UP STUDY  06/16/2020  . IR ANGIOGRAM FOLLOW UP STUDY  06/16/2020  . IR CT HEAD LTD  06/16/2020  . IR NEURO EACH ADD'L AFTER BASIC UNI RIGHT (MS)  06/16/2020  . IR TRANSCATH/EMBOLIZ  06/16/2020  . IR US GUIDE VASC ACCESS RIGHT  06/16/2020  . RADIOLOGY WITH ANESTHESIA N/A 06/16/2020   Procedure: IR WITH ANESTHESIA;  Surgeon: Lisbeth Renshaw, MD;  Location: [facility] OR;  Service: Radiology;  Laterality: N/A;  . TUBAL LIGATION    . VENTRAL HERNIA REPAIR N/A 12/29/2014   Procedure: LAPAROSCOPIC VENTRAL HERNIA WITH MESH;  Surgeon: Franky Macho, MD;  Location: [facility] ORS;  Service: General;  Laterality: N/A;    Social History:  reports that she has never smoked. She has never used smokeless tobacco. She reports that she does not  drink alcohol and does not use drugs.  Allergies:  Allergies  Allergen Reactions  . Hydrocodone Nausea Only    Medications Prior to Admission  Medication Sig Dispense Refill  . acetaminophen (TYLENOL) 500 MG tablet Take 1,000 mg by mouth every 6 (six) hours as needed.    . APPLE CIDER VINEGAR PO Take 1 tablet by mouth daily at 12 noon.    . Ascorbic Acid (VITAMIN C) 100 MG tablet Take 100 mg by mouth daily.    . Aspirin-Acetaminophen-Caffeine (EXCEDRIN PO) Take 1 tablet by mouth daily as needed.    . Calcium 150 MG TABS Take 1 tablet by mouth daily.    . Cholecalciferol (VITAMIN D3) 1.25 MG (50000 UT) CAPS Take 1 tablet by mouth daily.    Marland Kitchen doxylamine, Sleep, (UNISOM) 25 MG tablet Take 25 mg by mouth at bedtime as needed.    . Misc Natural Products (TURMERIC CURCUMIN) CAPS Take 1 capsule by mouth daily.    . Multiple Vitamins-Minerals (CENTRUM ADULTS PO) Take 1 tablet by mouth daily.    . naproxen sodium (ALEVE) 220 MG tablet Take 220 mg by mouth.    . zinc gluconate 50 MG tablet Take 50 mg by mouth daily.    Marland Kitchen etodolac (LODINE) 400 MG tablet Take 1 tablet (400 mg total) by mouth 2 (two) times daily. With food (Patient not taking: No sig reported) 28 tablet 0  Physical Exam: Blood pressure 134/75, pulse (!) 103, temperature 98.7 F (37.1 C), temperature source Axillary, resp. rate (!) 26, height 5\' 4"  (1.626 m), weight 103.5 kg, SpO2 100 %. General: sleeping, didn't wake up for me but apparently did for RN this am Heart: regular, rate, and rhythm.  Normal s1,s2. No obvious murmurs, gallops, or rubs noted.  Palpable radial and pedal pulses bilaterally Lungs: CTAB, no wheezes, rhonchi, or rales noted.  Respiratory effort nonlabored Abd: soft, obese, scars from lap UHR, LLQ scar from skull in abdomen. Psych: sleeping, on vent, unable   Results for orders placed or performed during the hospital encounter of 06/15/20 (from the past 48 hour(s))  Glucose, capillary     Status:  Abnormal   Collection Time: 06/29/20  3:15 PM  Result Value Ref Range   Glucose-Capillary 116 (H) 70 - 99 mg/dL    Comment: Glucose reference range applies only to samples taken after fasting for at least 8 hours.  Glucose, capillary     Status: Abnormal   Collection Time: 06/29/20  7:16 PM  Result Value Ref Range   Glucose-Capillary 101 (H) 70 - 99 mg/dL    Comment: Glucose reference range applies only to samples taken after fasting for at least 8 hours.  Glucose, capillary     Status: Abnormal   Collection Time: 06/29/20 11:12 PM  Result Value Ref Range   Glucose-Capillary 107 (H) 70 - 99 mg/dL    Comment: Glucose reference range applies only to samples taken after fasting for at least 8 hours.  Glucose, capillary     Status: Abnormal   Collection Time: 06/30/20  3:06 AM  Result Value Ref Range   Glucose-Capillary 104 (H) 70 - 99 mg/dL    Comment: Glucose reference range applies only to samples taken after fasting for at least 8 hours.  Glucose, capillary     Status: None   Collection Time: 06/30/20  7:37 AM  Result Value Ref Range   Glucose-Capillary 81 70 - 99 mg/dL    Comment: Glucose reference range applies only to samples taken after fasting for at least 8 hours.  Glucose, capillary     Status: Abnormal   Collection Time: 06/30/20 11:35 AM  Result Value Ref Range   Glucose-Capillary 115 (H) 70 - 99 mg/dL    Comment: Glucose reference range applies only to samples taken after fasting for at least 8 hours.  Glucose, capillary     Status: Abnormal   Collection Time: 06/30/20  4:02 PM  Result Value Ref Range   Glucose-Capillary 119 (H) 70 - 99 mg/dL    Comment: Glucose reference range applies only to samples taken after fasting for at least 8 hours.  APTT     Status: Abnormal   Collection Time: 06/30/20  7:04 PM  Result Value Ref Range   aPTT 40 (H) 24 - 36 seconds    Comment:        IF BASELINE aPTT IS ELEVATED, SUGGEST PATIENT RISK ASSESSMENT BE USED TO DETERMINE  APPROPRIATE ANTICOAGULANT THERAPY. Performed at [facility], [address], [place], [place]   Protime-INR     Status: None   Collection Time: 06/30/20  7:04 PM  Result Value Ref Range   Prothrombin Time 13.8 11.4 - 15.2 seconds   INR 1.1 0.8 - 1.2    Comment: (NOTE) INR goal varies based on device and disease states. Performed at [facility], [address], [place], [place]  Glucose, capillary     Status: Abnormal   Collection Time: 06/30/20  7:16 PM  Result Value Ref Range   Glucose-Capillary 106 (H) 70 - 99 mg/dL    Comment: Glucose reference range applies only to samples taken after fasting for at least 8 hours.  Glucose, capillary     Status: Abnormal   Collection Time: 06/30/20 11:11 PM  Result Value Ref Range   Glucose-Capillary 123 (H) 70 - 99 mg/dL    Comment: Glucose reference range applies only to samples taken after fasting for at least 8 hours.  Glucose, capillary     Status: None   Collection Time: 07/01/20  3:27 AM  Result Value Ref Range   Glucose-Capillary 91 70 - 99 mg/dL    Comment: Glucose reference range applies only to samples taken after fasting for at least 8 hours.  CBC     Status: Abnormal   Collection Time: 07/01/20  4:22 AM  Result Value Ref Range   WBC 16.8 (H) 4.0 - 10.5 K/uL   RBC 2.76 (L) 3.87 - 5.11 MIL/uL   Hemoglobin 8.0 (L) 12.0 - 15.0 g/dL   HCT 76.8 (L) 11.5 - 72.6 %   MCV 90.9 80.0 - 100.0 fL   MCH 29.0 26.0 - 34.0 pg   MCHC 31.9 30.0 - 36.0 g/dL   RDW 20.3 55.9 - 74.1 %   Platelets 270 150 - 400 K/uL   nRBC 0.0 0.0 - 0.2 %    Comment: Performed at [facility], [address] [place], [place] [address]  Basic metabolic panel     Status: Abnormal   Collection Time: 07/01/20  4:22 AM  Result Value Ref Range   Sodium 135 135 - 145 mmol/L   Potassium 4.1 3.5 - 5.1 mmol/L   Chloride 97 (L) 98 - 111 mmol/L   CO2 31 22 - 32 mmol/L   Glucose, Bld 97 70 - 99 mg/dL    Comment: Glucose  reference range applies only to samples taken after fasting for at least 8 hours.   BUN 22 (H) 6 - 20 mg/dL   Creatinine, Ser 3.64 0.44 - 1.00 mg/dL   Calcium 68.0 (H) 8.9 - 10.3 mg/dL   GFR, Estimated >32 >12 mL/min    Comment: (NOTE) Calculated using the CKD-EPI Creatinine Equation (2021)    Anion gap 7 5 - 15    Comment: Performed at [facility], [address], [place], [place] [address]  Glucose, capillary     Status: None   Collection Time: 07/01/20  7:41 AM  Result Value Ref Range   Glucose-Capillary 86 70 - 99 mg/dL    Comment: Glucose reference range applies only to samples taken after fasting for at least 8 hours.   No results found.    Assessment/Plan SAH with subsequent L MCA CVA and Left hemicraniectomy VDRF  Dysphagia Will plan to move forward with PEG tube placement today at noon.  I have discussed the procedure as well as risks and complications, including, but not limited to bleeding, infection, injury to surrounding structures, inadvertent removal of tube and development of abscess or need for laparotomy, etc with the two daughters on the phone.  Both were in agreement to move forward with this procedure.    Letha Cape, [facility] Surgery 07/01/2020, 11:19 AM Please see Amion for pager number during day hours 7:00am-4:30pm or 7:00am -11:30am on weekends

## 2020-07-01 NOTE — Progress Notes (Signed)
  NEUROSURGERY PROGRESS NOTE   No issues overnight.  EXAM:  BP 113/80   Pulse 63   Temp 99.6 F (37.6 C) (Axillary)   Resp 15   Ht 5\' 4"  (1.626 m)   Wt 103.5 kg   SpO2 100%   BMI 39.17 kg/m   Intubated Opens eyes to voice Pupils reactive bilaterally Following commands intermittently by squeezing with left hand Purposeful LLE, no movement RUE/RLE Crani site: c/d/i abd incision: c/d/i  IMPRESSION/PLAN 60 y.o. female POD14coiling ruptured LMCA complicated by intraop rupture, left MCA infarct POD12left hemicraniectomy.  Subclinical seizures - continue supportive care, nimotop  -seizures:continue Keppra and Depakote per neuro -vent per CCM.Trach per CCM

## 2020-07-01 NOTE — Procedures (Addendum)
Percutaneous Tracheostomy Procedure Note   Denise Savage  [id number]  1960-07-24  Date:07/01/20  Time:2:53 PM   Provider Performing:Shaka Zech  Procedure: Percutaneous Tracheostomy with Bronchoscopic Guidance (54562)  Indication(s) Status post SAH with inability to protect airway.   Consent Risks of the procedure as well as the alternatives and risks of each were explained to the patient and/or caregiver.  Consent for the procedure was obtained.  Anesthesia Etomidate, Versed, Fentanyl, Vecuronium   Time Out Verified patient identification, verified procedure, site/side was marked, verified correct patient position, special equipment/implants available, medications/allergies/relevant history reviewed, required imaging and test results available.   Sterile Technique Maximal sterile technique including sterile barrier drape, hand hygiene, sterile gown, sterile gloves, mask, hair covering.    Procedure Description Appropriate anatomy identified by palpation.  Patient's neck prepped and draped in sterile fashion.  1% lidocaine with epinephrine was used to anesthetize skin overlying neck.  1.5cm incision made and blunt dissection performed until tracheal rings could be easily palpated.   Then a size 6 Shiley tracheostomy was placed under bronchoscopic visualization using usual Seldinger technique and serial dilation.   Bronchoscope confirmed placement above the carina.  Tracheostomy was sutured in place with adhesive pad to protect skin under pressure.    Patient connected to ventilator.   Complications/Tolerance None; patient tolerated the procedure well. Chest X-ray is ordered to confirm no post-procedural complication.   EBL Minimal   Specimen(s) None   Lynnell Catalan, MD [facility] ICU Physician [facility] [place] Critical Care  Pager: [phone] Or Epic Secure Chat After hours: [phone].  07/01/2020, 2:55 PM

## 2020-07-02 ENCOUNTER — Inpatient Hospital Stay (HOSPITAL_COMMUNITY): Payer: Medicaid Other

## 2020-07-02 DIAGNOSIS — J9600 Acute respiratory failure, unspecified whether with hypoxia or hypercapnia: Secondary | ICD-10-CM

## 2020-07-02 LAB — GLUCOSE, CAPILLARY
Glucose-Capillary: 96 mg/dL (ref 70–99)
Glucose-Capillary: 120 mg/dL — ABNORMAL HIGH (ref 70–99)
Glucose-Capillary: 128 mg/dL — ABNORMAL HIGH (ref 70–99)
Glucose-Capillary: 128 mg/dL — ABNORMAL HIGH (ref 70–99)
Glucose-Capillary: 115 mg/dL — ABNORMAL HIGH (ref 70–99)
Glucose-Capillary: 114 mg/dL — ABNORMAL HIGH (ref 70–99)

## 2020-07-02 MED ORDER — SODIUM CHLORIDE 0.9 % IV SOLN
3.0000 g | Freq: Three times a day (TID) | INTRAVENOUS | Status: AC
  Administered 2020-07-02 – 2020-07-07 (×15): 3 g via INTRAVENOUS
  Filled 2020-07-02: qty 8
  Filled 2020-07-02 (×4): qty 3
  Filled 2020-07-02: qty 8
  Filled 2020-07-02 (×9): qty 3

## 2020-07-02 NOTE — Progress Notes (Signed)
NAME:  Denise Savage, MRN:  [id number], DOB:  1961/01/13, LOS: 16 ADMISSION DATE:  06/15/2020, CONSULTATION DATE:  06/16/20 REFERRING MD:  Dr. Maurice Small, CHIEF COMPLAINT:  VDRF   Brief History:  60 year old female presented headache x 5 days found to have left sylvian fissure subarachnoid hemorrhage with a left MCA bifurcation aneurysm.  Transferred to [facility] for further care and went for diagnostic angiography with coiling, complicated by intraoperative rupture and subsequent severe vasospasm leading to malignant left MCA stroke.  She returned to the ICU intubated.  Past Medical History:   has a past medical history of Abdominal hernia.  Significant Hospital Events:  1/3 present to [facility] emergency department 1/4 transferred to [facility], NSGY admitting/ taken to neuro IR for coiling.  Remained on vent postop 1/5 more lethargic and unresponsive, stat head CT was done which showed progressive left MCA infarct with brain edema and 6 mm midline shift.  Persistent nonprogressive subarachnoid hemorrhage. 1/6 worsening edema on CT, 3% saline continued 1/7 Keppra increased for subclinical seizures on LTM EEG 1/8 Low-grade febrile 100.9 1/12 modafinil started  Consults:  Neuro IR PCCM  Procedures:  1/4 diagnostic angiogram with coiling 1/7 left hemicraniectomy 1/7 R IJ central line  Significant Diagnostic Tests:  1/3 >> CT angiogram head neck > small volume acute subarachnoid hemorrhage centered at the base of the left sylvian fissure.  6 x 6 x 9 mm irregular aneurysm arising from the left MCA bifurcation.  1/5 CTH >> Diffused and partially reabsorbed subarachnoid hemorrhage since yesterday. 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  1/6 CTH >> Progressive swelling of the left MCA infarct with new midline shift measuring 6 mm. Nonprogressive subarachnoid hemorrhage. No evidence of new territory infarct  1/6 CTH  >> increasing midline shift 8 mm  1/6 >> LTM EEG evidence  of epileptogenicity arising from right parietal region , no seizures  1/8 LTM EEG >> evidence ofcortical dysfunctionand epileptogenicity in left frontal region consistent with underlying craniotomy andstructural abnormality. After propofol was stopped, eeg showed periodic epileptiform discharges at 1-1.5hz  In left frontal region which were intermittent and rhythmic which is on the ictal-interictal continuum , worse compared to 1/7  1/9 LTM EEG similar to 1/8  1/10 echocardiogram>>EF 60-65%. No valvular abnormalities. Greater than 50% respiratory variability of IVC  Micro Data:  1/3 SARS/ flu >>neg 1/4 MRSA  >> neg 1/8 resp >> ng  Antimicrobials:  Unasyn 1/8>>1/12 Unasyn 1/20>>>  Interim History / Subjective:  Trach placed yesterday  Episode of vomiting overnight.  Not tol wean this am   Objective   Blood pressure 121/73, pulse (!) 127, temperature (!) 103.1 F (39.5 C), temperature source Axillary, resp. rate (!) 26, height 5\' 4"  (1.626 m), weight 103.7 kg, SpO2 100 %.    Vent Mode: PRVC FiO2 (%):  [30 %-40 %] 40 % Set Rate:  [15 bmp] 15 bmp Vt Set:  [430 mL] 430 mL PEEP:  [5 cmH20] 5 cmH20 Plateau Pressure:  [17 cmH20-21 cmH20] 21 cmH20   Intake/Output Summary (Last 24 hours) at 07/02/2020 1003 Last data filed at 07/02/2020 0900 Gross per 24 hour  Intake 1113.14 ml  Output 1550 ml  Net -436.86 ml   Filed Weights   06/30/20 0500 07/01/20 0500 07/02/20 0500  Weight: 103.3 kg 103.5 kg 103.7 kg   Physical Exam: General: Acute on chronic ill appearing middle aged female lying in bed on mechanical ventilation, in NAD HEENT: trach c/d, MM pink/moist, PERRL,  Neuro: eyes  open, does not follow commands, grimaces to pain   CV: s1s2 regular rate and rhythm, no murmur, rubs, or gallops,  PULM:  Clear to ascultation bilaterally, some airleak around trach, no added breath sounds, no increased WOB on vent,  GI: soft, bowel sounds active in all 4 quadrants, non-tender,  non-distended, tolerating TF Extremities: warm/dry, no edema  Skin: no rashes or lesions  Resolved Hospital Problem list   Aspiration pneumonia--received 5d course of unasyn 1/8-1/12  Assessment & Plan:   Aneurysmal subarachnoid hemorrhage due to ruptured left MCA aneurysm s/p coiling.  -Hunt & Hess score 1, Fisher grade 1. 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 Still not following commands consistently on the left -s/p trach and PEG  - Liberalize BP goals. Tolerate MAP >65.    Critical illness requiring mechanical ventilation  S/p trach 1/20 PS wean as tol then consider trach collar trials F/u CXR  abx as below   ?aspiration PNA - post episode of emesis  PLAN -  F/u CXR  unasyn per pharmacy  Hold TF for today   Post-op anemia  -Trend CBC intermittently  -Counts remains stable    Best practice (evaluated daily)  Diet: NPO, tube feeds Pain/Anxiety/Delirium protocol (if indicated): prn fentanyl, delirium precautions VAP protocol (if indicated): Per protocol DVT prophylaxis: SCDs only  GI prophylaxis: PPI Glucose control: trend, with goal FS 140-180 Mobility: Bedrest Disposition: ICU  Goals of Care:  Last date of multidisciplinary goals of care discussion: 1/16 Family and staff present: both daughters, [name] and [place], PCCM Summary of discussion: Continue full aggressive care Follow up goals of care discussion due: daughters wish to pursue full scope of care (1/16) including tracheostomy placement  Code Status: Full   Labs   CBC: Recent Labs  Lab 07/01/20 0422  WBC 16.8*  HGB 8.0*  HCT 25.1*  MCV 90.9  PLT 270    Basic Metabolic Panel: Recent Labs  Lab 06/26/20 0555 06/27/20 0555 06/28/20 0528 07/01/20 0422  NA 138 140 138 135  K 3.6 3.4* 4.3 4.1  CL 102 106 101 97*  CO2 27 27 30 31   GLUCOSE 139* 117* 118* 97  BUN 13 16 18  22*  CREATININE 0.70  0.65 0.62 0.80  CALCIUM 10.1 9.8 10.4* 10.6*  MG 1.9 1.9  --   --    GFR: Estimated Creatinine Clearance: 87.7 mL/min (by C-G formula based on SCr of 0.8 mg/dL). Recent Labs  Lab 07/01/20 0422  WBC 16.8*    Liver Function Tests: No results for input(s): AST, ALT, ALKPHOS, BILITOT, PROT, ALBUMIN in the last 168 hours. No results for input(s): LIPASE, AMYLASE in the last 168 hours. Recent Labs  Lab 06/29/20 0406  AMMONIA 18    ABG    Component Value Date/Time   PHART 7.560 (H) 06/19/2020 1513   PCO2ART 31.6 (L) 06/19/2020 1513   PO2ART 138 (H) 06/19/2020 1513   HCO3 28.5 (H) 06/19/2020 1513   TCO2 29 06/19/2020 1513   ACIDBASEDEF 1.0 06/17/2020 0939   O2SAT 99.0 06/19/2020 1513      CBG: Recent Labs  Lab 07/01/20 1512 07/01/20 1938 07/01/20 2324 07/02/20 0314 07/02/20 0741  GLUCAP 83 87 121* 96 120*   CRITICAL CARE Performed by: 07/04/20  Total critical care time: 32 minutes  Critical care time was exclusive of separately billable procedures and treating other patients.  Critical care was necessary to treat or  prevent imminent or life-threatening deterioration.  Critical care was time spent personally by me on the following activities: development of treatment plan with patient and/or surrogate as well as nursing, discussions with consultants, evaluation of patient's response to treatment, examination of patient, obtaining history from patient or surrogate, ordering and performing treatments and interventions, ordering and review of laboratory studies, ordering and review of radiographic studies, pulse oximetry and re-evaluation of patient's condition.  Dirk Dress, NP Pulmonary/Critical Care Medicine  07/02/2020  10:03 AM

## 2020-07-02 NOTE — Progress Notes (Addendum)
1 Day Post-Op  Subjective: CC: S/p PEG yesterday by our team. Also had trach done by CCM.  Fever up to 103.1 yesterday. Tachycardic this am Notes overnight report patient had a small amount of vomitus appearing material in the back of her throat that did not look like tf's. TF's were held for 2 hours and then restarted without difficulty. She is currently at 25cc/hr  Objective: Vital signs in last 24 hours: Temp:  [97.5 F (36.4 C)-103.1 F (39.5 C)] 103.1 F (39.5 C) (01/20 0800) Pulse Rate:  [78-128] 127 (01/20 0800) Resp:  [15-28] 26 (01/20 0800) BP: (112-141)/(62-93) 121/73 (01/20 0800) SpO2:  [92 %-100 %] 100 % (01/20 0800) FiO2 (%):  [30 %-40 %] 40 % (01/20 0734) Weight:  [103.7 kg] 103.7 kg (01/20 0500) Last BM Date: 07/02/20  Intake/Output from previous day: 01/19 0701 - 01/20 0700 In: 766.2 [NG/GT:520.7; IV Piggyback:161.6] Out: 1550 [Urine:1400; Stool:150] Intake/Output this shift: Total I/O In: 346.9 [Other:280; NG/GT:39.6; IV Piggyback:27.3] Out: -    Vent Mode: PRVC FiO2 (%):  [30 %-40 %] 40 % Set Rate:  [15 bmp] 15 bmp Vt Set:  [430 mL] 430 mL PEEP:  [5 cmH20] 5 cmH20 Plateau Pressure:  [17 cmH20-21 cmH20] 21 cmH20   PE: Gen: Sleeping, did not wake for more Heart: Tacycardic Lungs: Trached on vent Abd: Soft, obese, no rigidity or guarding. PEG tube in place at 6cm. TF's running at 25cc/hr  Lab Results:  Recent Labs    07/01/20 0422  WBC 16.8*  HGB 8.0*  HCT 25.1*  PLT 270   BMET Recent Labs    07/01/20 0422  NA 135  K 4.1  CL 97*  CO2 31  GLUCOSE 97  BUN 22*  CREATININE 0.80  CALCIUM 10.6*   PT/INR Recent Labs    06/30/20 1904  LABPROT 13.8  INR 1.1   CMP     Component Value Date/Time   NA 135 07/01/2020 0422   K 4.1 07/01/2020 0422   CL 97 (L) 07/01/2020 0422   CO2 31 07/01/2020 0422   GLUCOSE 97 07/01/2020 0422   BUN 22 (H) 07/01/2020 0422   CREATININE 0.80 07/01/2020 0422   CALCIUM 10.6 (H) 07/01/2020 0422    PROT 6.5 11/15/2014 0637   ALBUMIN 3.1 (L) 06/18/2020 0633   AST 21 11/15/2014 0637   ALT 10 (L) 11/15/2014 0637   ALKPHOS 76 11/15/2014 0637   BILITOT 0.5 11/15/2014 0637   GFRNONAA >60 07/01/2020 0422   GFRAA >60 12/25/2014 0955   Lipase     Component Value Date/Time   LIPASE 36 11/15/2014 0637       Studies/Results: No results found.  Anti-infectives: Anti-infectives (From admission, onward)   Start     Dose/Rate Route Frequency Ordered Stop   06/20/20 2200  Ampicillin-Sulbactam (UNASYN) 3 g in sodium chloride 0.9 % 100 mL IVPB        3 g 200 mL/hr over 30 Minutes Intravenous Every 6 hours 06/20/20 1720 06/24/20 2213   06/20/20 1045  Ampicillin-Sulbactam (UNASYN) 3 g in sodium chloride 0.9 % 100 mL IVPB  Status:  Discontinued        3 g 200 mL/hr over 30 Minutes Intravenous Every 6 hours 06/20/20 0947 06/20/20 1720       Assessment/Plan SAH with subsequent L MCA CVA and Left hemicraniectomy VDRF - s/p Trach 1/19 by CCM (Dr. Denese Killings)  Dysphagia - s/p PEG 1/19 by Dr. Bedelia Person - Given fever, tachycardia overnight with reported  emesis noted in the back of her throat early this am - will continue to follow and recheck later this PM vs the AM to ensure she tolerates TF's being escalated back up to goal. Will defer fever workup to CCM and primary team. They are currently getting a CXR and starting on abx. Her abdomen is soft currently.    LOS: 16 days    Jacinto Halim , [facility] Surgery 07/02/2020, 9:15 AM Please see Amion for pager number during day hours 7:00am-4:30pm

## 2020-07-02 NOTE — Progress Notes (Addendum)
°  NEUROSURGERY PROGRESS NOTE   Patient is s/p trach/PEG yesterday Emesis noted in back of throat overnight. CCM notified. Otherwise no acute events.  EXAM:  BP 120/69    Pulse (!) 128    Temp (!) 100.7 F (38.2 C) (Axillary)    Resp (!) 25    Ht 5\' 4"  (1.626 m)    Wt 103.7 kg    SpO2 100%    BMI 39.24 kg/m   Trach Opens eyes to pain Eyes midline this am PERRL Not following commands this am Minimal response with LUE/LLE this am. No movement RUE/RLE Crani site: c/d/i abd incision: c/d/i  IMPRESSION/PLAN 60 y.o. female POD15 coiling ruptured LMCA complicated by intraop rupture, left MCA infarct POD13 left hemicraniectomy.  Subclinical seizures S/P Trach/PEG - Emesis: TF held. Restarted.Monitor. likely aspirated. CXR ordered. - continue supportive care, nimotop  - seizures: continue Keppra and Depakote per neuro - Trach per CCM. PEG per Surgery - less responsive. Will see how she progresses through the day.

## 2020-07-02 NOTE — Procedures (Signed)
Tracheostomy Change Note  Patient Details:   Name: Denise Savage DOB: 1961/04/07 MRN: [id number]    Airway Documentation:     Evaluation  O2 sats: stable throughout Complications: No apparent complications Patient did tolerate procedure well. Bilateral Breath Sounds: Diminished,Rhonchi   Assisted Dr. Denese Killings with trach exchange to a #6 XLT distal over tube exchanger. Positive color change noted on CO2 detector. Janina Mayo was secured with trach ties.     Denise Savage, Margaretmary Dys 07/02/2020, 3:10 PM

## 2020-07-02 NOTE — TOC CAGE-AID Note (Signed)
Transition of Care [facility]) - CAGE-AID Screening   Patient Details  Name: Denise Savage MRN: [id number] Date of Birth: Nov 14, 1960  Transition of Care [facility]): Etheleen Sia, RN, TRN Phone Number: Trauma Services 07/02/2020, 10:28 AM  CAGE-AID Screening: Substance Abuse Screening unable to be completed due to: : Patient unable to participate (Ventilator/Sedation)

## 2020-07-02 NOTE — Progress Notes (Signed)
No further emesis on day shift.  TFs at 25cc/hr.  Abdomen is soft and nondistended but difficult to fully gauge as she is obese.  Patient not currently sedated and with deep palpation does not grimace or respond as if she has abdominal pain.  g-tube site is c/d/i.  Continue TF advancements as tolerates per CCM/primary service.   We will sign off at this time.  Please call us back if there are any further issues with her g-tube.  Letha Cape 3:15 PM 07/02/2020

## 2020-07-02 NOTE — Procedures (Signed)
Tracheostomy Exchange Procedure Note  KENYATTE GRUBER  [id number]  [birth year]  Date:07/02/20  Time:3:36 PM   Provider Performing:Zianne Schubring   Procedure: Tracheostomy Exchange Through Immature Stoma (40973)  Indication(s) Air leak  Consent Unable to obtain consent due to emergent nature of procedure.  Anesthesia None   Time Out Verified patient identification, verified procedure, site/side was marked, verified correct patient position, special equipment/implants available, medications/allergies/relevant history reviewed, required imaging and test results available.   Sterile Technique Hand hygiene, gloves   Procedure Description Size 6 cuffed existing Shiley removed and size 6 cuffed Distal XLT Shiley placed through stoma.   Complications/Tolerance None; patient tolerated the procedure well..   EBL none  Lynnell Catalan, MD [facility] ICU Physician [facility] Critical Care  Pager: [phone] Or Epic Secure Chat After hours: [phone].  07/02/2020, 3:37 PM

## 2020-07-02 NOTE — Progress Notes (Signed)
eLink Physician-Brief Progress Note Patient Name: Denise Savage DOB: January 08, 1961 MRN: [id number]   Date of Service  07/02/2020  HPI/Events of Note  Patient with a little bit of vomitus appearing material in the back of her throat, did not actually look like enteral feed, bedside RN held tube feeds and gave Zofran.  eICU Interventions  Bedside RN asked to resume enteral feeding at half the baseline rate 2 hours after they were held, and monitor patient closely.         Denise Savage 07/02/2020, 2:28 AM

## 2020-07-03 ENCOUNTER — Inpatient Hospital Stay (HOSPITAL_COMMUNITY): Payer: Medicaid Other

## 2020-07-03 ENCOUNTER — Other Ambulatory Visit (HOSPITAL_COMMUNITY): Payer: Self-pay

## 2020-07-03 LAB — GLUCOSE, CAPILLARY
Glucose-Capillary: 108 mg/dL — ABNORMAL HIGH (ref 70–99)
Glucose-Capillary: 120 mg/dL — ABNORMAL HIGH (ref 70–99)
Glucose-Capillary: 123 mg/dL — ABNORMAL HIGH (ref 70–99)
Glucose-Capillary: 89 mg/dL (ref 70–99)
Glucose-Capillary: 94 mg/dL (ref 70–99)
Glucose-Capillary: 99 mg/dL (ref 70–99)

## 2020-07-03 LAB — BASIC METABOLIC PANEL
Anion gap: 11 (ref 5–15)
BUN: 36 mg/dL — ABNORMAL HIGH (ref 6–20)
CO2: 26 mmol/L (ref 22–32)
Calcium: 11 mg/dL — ABNORMAL HIGH (ref 8.9–10.3)
Chloride: 100 mmol/L (ref 98–111)
Creatinine, Ser: 0.93 mg/dL (ref 0.44–1.00)
GFR, Estimated: >60 mL/min (ref >60)
Glucose, Bld: 131 mg/dL — ABNORMAL HIGH (ref 70–99)
Potassium: 3.8 mmol/L (ref 3.5–5.1)
Sodium: 137 mmol/L (ref 135–145)

## 2020-07-03 MED ORDER — LEVETIRACETAM 100 MG/ML PO SOLN
1000.0000 mg | Freq: Two times a day (BID) | ORAL | Status: DC
  Administered 2020-07-03 – 2020-07-08 (×10): 1000 mg
  Filled 2020-07-03 (×10): qty 10

## 2020-07-03 MED ORDER — HEPARIN SODIUM (PORCINE) 5000 UNIT/ML IJ SOLN
5000.0000 [IU] | Freq: Three times a day (TID) | INTRAMUSCULAR | Status: DC
  Administered 2020-07-03 – 2020-09-10 (×204): 5000 [IU] via SUBCUTANEOUS
  Filled 2020-07-03 (×205): qty 1

## 2020-07-03 MED ORDER — IOHEXOL 350 MG/ML SOLN
75.0000 mL | Freq: Once | INTRAVENOUS | Status: AC | PRN
  Administered 2020-07-03: 75 mL via INTRAVENOUS

## 2020-07-03 NOTE — Progress Notes (Addendum)
°  NEUROSURGERY PROGRESS NOTE   Trach exchanged yesterday afternoon, otherwise NAE  EXAM:  BP 123/83    Pulse 100    Temp 99.4 F (37.4 C) (Oral)    Resp (!) 32    Ht 5\' 4"  (1.626 m)    Wt 104.2 kg    SpO2 100%    BMI 39.43 kg/m   Trach Minimal eye opening to pain. PERRL Minimal flexion LUE to pain. No movement RUE/RLE Incisions: c/d/i  IMPRESSION/PLAN 60 y.o. female POD16 coiling ruptured LMCA complicated by intraop rupture, left MCA infarct POD14left hemicraniectomy. Remains more somnolent. Subclinical seizures S/P Trach/PEG - Somnolence: Likely secondary to aspiration pneumonia. Will check CTA head - Likely aspiration PNA: per CCM  - continue supportive care - Pending CTA will d/c nimotop as likely 3 weeks since [facility] -seizures:continue Keppra and Depakote

## 2020-07-03 NOTE — TOC Initial Note (Signed)
Transition of Care [facility]) - Initial/Assessment Note    Patient Details  Name: Denise Savage MRN: [id number] Date of Birth: 05-04-1961  Transition of Care [facility]) CM/SW Contact:    Lorri Frederick, LCSW Phone Number: 07/03/2020, 1:49 PM  Clinical Narrative:  Pt unable to participate in assessment, CSW spoke with daughter [name].  She was aware of plan for SNF and is in agreement with starting SNF referral process.  Wants SNF in [place] if possible. Pt is vaccinated for covid, but not boosted.                    Expected Discharge Plan: Skilled Nursing Facility Barriers to Discharge: Continued Medical Work up,SNF Pending bed offer   Patient Goals and CMS Choice        Expected Discharge Plan and Services Expected Discharge Plan: Skilled Nursing Facility     Post Acute Care Choice: Skilled Nursing Facility Living arrangements for the past 2 months: Single Family Home                                      Prior Living Arrangements/Services Living arrangements for the past 2 months: Single Family Home Lives with:: Adult Children Patient language and need for interpreter reviewed:: No        Need for Family Participation in Patient Care: Yes (Comment) Care giver support system in place?: Yes (comment) Current home services: Other (comment) (none) Criminal Activity/Legal Involvement Pertinent to Current Situation/Hospitalization: No - Comment as needed  Activities of Daily Living Home Assistive Devices/Equipment: None ADL Screening (condition at time of admission) Patient's cognitive ability adequate to safely complete daily activities?: Yes Is the patient deaf or have difficulty hearing?: No Does the patient have difficulty seeing, even when wearing glasses/contacts?: No (acute astigmatism; wear prescription glasses) Does the patient have difficulty concentrating, remembering, or making decisions?: No Patient able to express need for assistance with ADLs?:  Yes Does the patient have difficulty dressing or bathing?: No Independently performs ADLs?: Yes (appropriate for developmental age) Does the patient have difficulty walking or climbing stairs?: No Weakness of Legs: None Weakness of Arms/Hands: None  Permission Sought/Granted                  Emotional Assessment Appearance:: Appears stated age Attitude/Demeanor/Rapport: Unable to Assess Affect (typically observed): Unable to Assess Orientation: :  (not oriented) Alcohol / Substance Use: Not Applicable Psych Involvement: No (comment)  Admission diagnosis:  Subarachnoid hemorrhage (HCC) [I60.9] Aneurysmal subarachnoid hemorrhage (HCC) [I60.8] ICH (intracerebral hemorrhage) (HCC) [I61.9] Patient Active Problem List   Diagnosis Date Noted  . Acute respiratory failure (HCC)   . ICH (intracerebral hemorrhage) (HCC) 06/18/2020  . 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  . SAH (subarachnoid hemorrhage) (HCC) 06/15/2020   PCP:  Babs Sciara, MD Pharmacy:   [facility] [phone] - [place], [place] - [address] [place] [address] Phone: [phone] Fax: [phone]     Social Determinants of Health (SDOH) Interventions    Readmission Risk Interventions No flowsheet data found.

## 2020-07-03 NOTE — Progress Notes (Signed)
Patient was transported to CT & back to 4N19 without any complications.  ?

## 2020-07-03 NOTE — Progress Notes (Signed)
EEG complete - results pending 

## 2020-07-03 NOTE — Procedures (Signed)
Name:Denise Savage JJO:[id number] Epilepsy Attending:Anaid Haney [name] Referring Physician/Provider:Dr. Lynnell Catalan Date: 07/03/2020 Duration:28.01 mins  Patient history:60 year old female with ruptured left MCA aneurysm status post coiling,continues to be encephalopathic.EEG to evaluate for seizures.  Level of alertness:comatose  AEDs during EEG study:Keppra  Technical aspects: This EEG study was done with scalp electrodes positioned according to the 10-20 International system of electrode placement. Electrical activity was acquired at a sampling rate of 500Hz  and reviewed with a high frequency filter of 70Hz  and a low frequency filter of 1Hz . EEG data were recorded continuously and digitally stored.   Description:EEG showed continuous generalizedand maximal  left frontal region 3-5Hz  theta-delta slowing. Periodic epileptiform dischargeswere seen in left frontal region at 0.5-1Hz  which were intermittent.  ABNORMALITY -Periodic epileptiform discharges, left frontal region -Continuous slow, generalized and maximal  left frontal region  IMPRESSION: This study showedevidence ofcortical dysfunctionand epileptogenicity in left frontal region consistent with underlying craniotomy andstructural abnormality.Additionally there is severe diffuse encephalopathy, nonspecific etiology.No seizures were seen during this study.    [name] 

## 2020-07-03 NOTE — Progress Notes (Signed)
NAME:  Denise Savage, MRN:  [id number], DOB:  1961/04/30, LOS: 17 ADMISSION DATE:  06/15/2020, CONSULTATION DATE:  06/16/20 REFERRING MD:  Dr. Maurice Small, CHIEF COMPLAINT:  VDRF   Brief History:  60 year old female presented headache x 5 days found to have left sylvian fissure subarachnoid hemorrhage with a left MCA bifurcation aneurysm.  Transferred to [facility] for further care and went for diagnostic angiography with coiling, complicated by intraoperative rupture and subsequent severe vasospasm leading to malignant left MCA stroke.  She returned to the ICU intubated.  Past Medical History:   has a past medical history of Abdominal hernia.  Significant Hospital Events:  1/3 present to [facility] emergency department 1/4 transferred to [facility], NSGY admitting/ taken to neuro IR for coiling.  Remained on vent postop 1/5 more lethargic and unresponsive, stat head CT was done which showed progressive left MCA infarct with brain edema and 6 mm midline shift.  Persistent nonprogressive subarachnoid hemorrhage. 1/6 worsening edema on CT, 3% saline continued 1/7 Keppra increased for subclinical seizures on LTM EEG 1/8 Low-grade febrile 100.9 1/12 modafinil started  Consults:  Neuro IR PCCM  Procedures:  1/4 diagnostic angiogram with coiling 1/7 left hemicraniectomy 1/7 R IJ central line 1/19 PEG tube 1/19 tracheostomy  Significant Diagnostic Tests:  1/3 >> CT angiogram head neck > small volume acute subarachnoid hemorrhage centered at the base of the left sylvian fissure.  6 x 6 x 9 mm irregular aneurysm arising from the left MCA bifurcation.  1/5 CTH >> Diffused and partially reabsorbed subarachnoid hemorrhage since yesterday. 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  1/6 CTH >> Progressive swelling of the left MCA infarct with new midline shift measuring 6 mm. Nonprogressive subarachnoid hemorrhage. No evidence of new territory infarct  1/6 CTH  >> increasing midline  shift 8 mm  1/6 >> LTM EEG evidence of epileptogenicity arising from right parietal region , no seizures  1/8 LTM EEG >> evidence ofcortical dysfunctionand epileptogenicity in left frontal region consistent with underlying craniotomy andstructural abnormality. After propofol was stopped, eeg showed periodic epileptiform discharges at 1-1.5hz  In left frontal region which were intermittent and rhythmic which is on the ictal-interictal continuum , worse compared to 1/7  1/9 LTM EEG similar to 1/8  1/10 echocardiogram>>EF 60-65%. No valvular abnormalities. Greater than 50% respiratory variability of IVC  Micro Data:  1/3 SARS/ flu >>neg 1/4 MRSA  >> neg 1/8 resp >> ng  Antimicrobials:  Unasyn 1/8>>1/12 Unasyn 1/20>>>  Interim History / Subjective:  Uneventful PEG and tracheostomy placement 2 days ago.  Episode of emesis thereafter.  More somnolent yesterday.  No longer tolerating weaning.  Increase secretions.  Suspected aspiration.  Antibiotics started.  Underwent CT angio today which showed no recurrent vasospasm.  No significant intracranial abnormalities capable of explaining her decreased mental status.  Objective   Blood pressure 117/64, pulse 91, temperature (!) 101.1 F (38.4 C), temperature source Axillary, resp. rate 18, height 5\' 4"  (1.626 m), weight 104.2 kg, SpO2 100 %.    Vent Mode: PRVC FiO2 (%):  [40 %] 40 % Set Rate:  [15 bmp] 15 bmp Vt Set:  [430 mL] 430 mL PEEP:  [5 cmH20] 5 cmH20 Plateau Pressure:  [22 cmH20-27 cmH20] 23 cmH20   Intake/Output Summary (Last 24 hours) at 07/03/2020 1635 Last data filed at 07/03/2020 1600 Gross per 24 hour  Intake 1719.94 ml  Output 1570 ml  Net 149.94 ml   Filed Weights   07/01/20 0500 07/02/20 0500  07/03/20 0500  Weight: 103.5 kg 103.7 kg 104.2 kg   Physical Exam: General: Acute on chronic ill appearing middle aged female lying in bed on mechanical ventilation, in NAD HEENT: trach c/d, MM pink/moist, PERRL,  Neuro:  Generally close, does not follow commands, grimaces to pain   CV: s1s2 regular rate and rhythm, no murmur, rubs, or gallops,  PULM:  Clear to ascultation bilaterally, some airleak around trach, no added breath sound.  Drop in tidal volume when switched to PSV.  Copious secretions but improved from yesterday GI: soft, bowel sounds active in all 4 quadrants, non-tender, non-distended, tolerating TF Extremities: warm/dry, no edema  Skin: no rashes or lesions  Resolved Hospital Problem list   Aspiration pneumonia--received 5d course of unasyn 1/8-1/12  Assessment & Plan:   Aneurysmal subarachnoid hemorrhage due to ruptured left MCA aneurysm s/p coiling.  Hunt & Hess score 1, Fisher grade 1. 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  Will perform EEG to rule out recurrent seizures as cause for decreased mental status. Have decreased Keppra dose and stopped melatonin as these may be causing increased somnolence.  Electrolytes normal. Alternatively increased somnolence is due to aspiration pneumonia.  No large infiltrate on chest x-ray.  Will complete 5 days of antibiotics.  Follow mental status.  Best practice (evaluated daily)  Diet: NPO, tube feeds Pain/Anxiety/Delirium protocol (if indicated): prn fentanyl, delirium precautions VAP protocol (if indicated): Per protocol DVT prophylaxis: SCDs only  GI prophylaxis: PPI Glucose control: trend, with goal FS 140-180 Mobility: Bedrest Disposition: ICU Family: Daughter updated today at bedside.  Goals of Care:  Last date of multidisciplinary goals of care discussion: 1/16 Family and staff present: both daughters, [name] and [place], PCCM Summary of discussion: Continue full aggressive care Follow up goals of care  discussion due: daughters wish to pursue full scope of care (1/16) including tracheostomy placement  Code Status: Full   Labs   CBC: Recent Labs  Lab 07/01/20 0422  WBC 16.8*  HGB 8.0*  HCT 25.1*  MCV 90.9  PLT 270    Basic Metabolic Panel: Recent Labs  Lab 06/27/20 0555 06/28/20 0528 07/01/20 0422 07/03/20 1207  NA 140 138 135 137  K 3.4* 4.3 4.1 3.8  CL 106 101 97* 100  CO2 27 30 31 26   GLUCOSE 117* 118* 97 131*  BUN 16 18 22* 36*  CREATININE 0.65 0.62 0.80 0.93  CALCIUM 9.8 10.4* 10.6* 11.0*  MG 1.9  --   --   --    GFR: Estimated Creatinine Clearance: 75.7 mL/min (by C-G formula based on SCr of 0.93 mg/dL). Recent Labs  Lab 07/01/20 0422  WBC 16.8*    Liver Function Tests: No results for input(s): AST, ALT, ALKPHOS, BILITOT, PROT, ALBUMIN in the last 168 hours. No results for input(s): LIPASE, AMYLASE in the last 168 hours. Recent Labs  Lab 06/29/20 0406  AMMONIA 18    ABG    Component Value Date/Time   PHART 7.560 (H) 06/19/2020 1513   PCO2ART 31.6 (L) 06/19/2020 1513   PO2ART 138 (H) 06/19/2020 1513   HCO3 28.5 (H) 06/19/2020 1513   TCO2 29 06/19/2020 1513   ACIDBASEDEF 1.0 06/17/2020 0939   O2SAT 99.0 06/19/2020 1513      CBG: Recent Labs  Lab 07/02/20 2323 07/03/20 07/05/20  07/03/20 0735 07/03/20 1134 07/03/20 1559  GLUCAP 114* 94 120* 123* 89   CRITICAL CARE Performed by: Lynnell Catalan  Total critical care time: 40 minutes  Critical care time was exclusive of separately billable procedures and treating other patients.  Critical care was necessary to treat or prevent imminent or life-threatening deterioration.  Critical care was time spent personally by me on the following activities: development of treatment plan with patient and/or surrogate as well as nursing, discussions with consultants, evaluation of patient's response to treatment, examination of patient, obtaining history from patient or surrogate, ordering and performing  treatments and interventions, ordering and review of laboratory studies, ordering and review of radiographic studies, pulse oximetry and re-evaluation of patient's condition.  Lynnell Catalan, MD [facility] ICU Physician [facility] [place] [facility]  Pager: [phone] Or Epic Secure Chat After hours: [phone].  07/03/2020, 4:36 PM

## 2020-07-04 LAB — CBC WITH DIFFERENTIAL/PLATELET
Abs Immature Granulocytes: 0.12 10*3/uL — ABNORMAL HIGH (ref 0.00–0.07)
Basophils Absolute: 0 10*3/uL (ref 0.0–0.1)
Basophils Relative: 0 %
Eosinophils Absolute: 0.2 10*3/uL (ref 0.0–0.5)
Eosinophils Relative: 2 %
HCT: 26.4 % — ABNORMAL LOW (ref 36.0–46.0)
Hemoglobin: 8.5 g/dL — ABNORMAL LOW (ref 12.0–15.0)
Immature Granulocytes: 1 %
Lymphocytes Relative: 6 %
Lymphs Abs: 0.8 10*3/uL (ref 0.7–4.0)
MCH: 28.6 pg (ref 26.0–34.0)
MCHC: 32.2 g/dL (ref 30.0–36.0)
MCV: 88.9 fL (ref 80.0–100.0)
Monocytes Absolute: 1.6 10*3/uL — ABNORMAL HIGH (ref 0.1–1.0)
Monocytes Relative: 11 %
Neutro Abs: 11.7 10*3/uL — ABNORMAL HIGH (ref 1.7–7.7)
Neutrophils Relative %: 80 %
Platelets: 355 10*3/uL (ref 150–400)
RBC: 2.97 MIL/uL — ABNORMAL LOW (ref 3.87–5.11)
RDW: 15.6 % — ABNORMAL HIGH (ref 11.5–15.5)
WBC: 14.5 10*3/uL — ABNORMAL HIGH (ref 4.0–10.5)
nRBC: 0 % (ref 0.0–0.2)

## 2020-07-04 LAB — BASIC METABOLIC PANEL
Anion gap: 10 (ref 5–15)
BUN: 33 mg/dL — ABNORMAL HIGH (ref 6–20)
CO2: 29 mmol/L (ref 22–32)
Calcium: 10.9 mg/dL — ABNORMAL HIGH (ref 8.9–10.3)
Chloride: 103 mmol/L (ref 98–111)
Creatinine, Ser: 0.82 mg/dL (ref 0.44–1.00)
GFR, Estimated: >60 mL/min (ref >60)
Glucose, Bld: 97 mg/dL (ref 70–99)
Potassium: 3.8 mmol/L (ref 3.5–5.1)
Sodium: 142 mmol/L (ref 135–145)

## 2020-07-04 LAB — GLUCOSE, CAPILLARY
Glucose-Capillary: 93 mg/dL (ref 70–99)
Glucose-Capillary: 96 mg/dL (ref 70–99)
Glucose-Capillary: 101 mg/dL — ABNORMAL HIGH (ref 70–99)
Glucose-Capillary: 91 mg/dL (ref 70–99)
Glucose-Capillary: 82 mg/dL (ref 70–99)
Glucose-Capillary: 90 mg/dL (ref 70–99)

## 2020-07-04 NOTE — Progress Notes (Signed)
Assisted tele visit to patient with family member.  Tymar Polyak M, RN  

## 2020-07-04 NOTE — Progress Notes (Signed)
  NEUROSURGERY PROGRESS NOTE   No issues overnight.   EXAM:  BP 134/86   Pulse 88   Temp 99.6 F (37.6 C) (Axillary)   Resp 19   Ht 5\' 4"  (1.626 m)   Wt 104.1 kg   SpO2 100%   BMI 39.39 kg/m   Opens eyes with stimulus Not tracking Breathing over vent Localizes LUE, w/d LLE No movement on right Wound c/d/i  IMAGING: CT/CTA personally reviewed. No HCP. No MLS. No significant vasospasm seen, in fact LMCA has recanalized with patent distal LMCA branches. No aneurysm filling seen.  IMPRESSION: - 60 y.o. 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

## 2020-07-04 NOTE — Progress Notes (Signed)
NAME:  Denise Savage, MRN:  [id number], DOB:  [birth year], LOS: 18 ADMISSION DATE:  06/15/2020, CONSULTATION DATE:  06/16/20 REFERRING MD:  Dr. Maurice Small, CHIEF COMPLAINT:  VDRF   Brief History:  60 year old female presented headache x 5 days found to have left sylvian fissure subarachnoid hemorrhage with a left MCA bifurcation aneurysm.  Transferred to [facility] for further care and went for diagnostic angiography with coiling, complicated by intraoperative rupture and subsequent severe vasospasm leading to malignant left MCA stroke.  She returned to the ICU intubated.  Past Medical History:   has a past medical history of Abdominal hernia.  Significant Hospital Events:  1/3 present to [facility] emergency department 1/4 transferred to [facility], NSGY admitting/ taken to neuro IR for coiling.  Remained on vent postop 1/5 more lethargic and unresponsive, stat head CT was done which showed progressive left MCA infarct with brain edema and 6 mm midline shift.  Persistent nonprogressive subarachnoid hemorrhage. 1/6 worsening edema on CT, 3% saline continued 1/7 Keppra increased for subclinical seizures on LTM EEG 1/8 Low-grade febrile 100.9 1/12 modafinil started  Consults:  Neuro IR PCCM  Procedures:  1/4 diagnostic angiogram with coiling 1/7 left hemicraniectomy 1/7 R IJ central line 1/19 PEG tube 1/19 tracheostomy  Significant Diagnostic Tests:  1/3 >> CT angiogram head neck > small volume acute subarachnoid hemorrhage centered at the base of the left sylvian fissure.  6 x 6 x 9 mm irregular aneurysm arising from the left MCA bifurcation.  1/5 CTH >> Diffused and partially reabsorbed subarachnoid hemorrhage since yesterday. 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  1/6 CTH >> Progressive swelling of the left MCA infarct with new midline shift measuring 6 mm. Nonprogressive subarachnoid hemorrhage. No evidence of new territory infarct  1/6 CTH  >> increasing midline  shift 8 mm  1/6 >> LTM EEG evidence of epileptogenicity arising from right parietal region , no seizures  1/8 LTM EEG >> evidence ofcortical dysfunctionand epileptogenicity in left frontal region consistent with underlying craniotomy andstructural abnormality. After propofol was stopped, eeg showed periodic epileptiform discharges at 1-1.5hz  In left frontal region which were intermittent and rhythmic which is on the ictal-interictal continuum , worse compared to 1/7  1/9 LTM EEG similar to 1/8  1/10 echocardiogram>>EF 60-65%. No valvular abnormalities. Greater than 50% respiratory variability of IVC  Micro Data:  1/3 SARS/ flu >>neg 1/4 MRSA  >> neg 1/8 resp >> ng  Antimicrobials:  Unasyn 1/8>>1/12 Unasyn 1/20>>>  Interim History / Subjective:   Remains more somnolent since tracheostomy placement.   Objective   Blood pressure 139/83, pulse 87, temperature 99.6 F (37.6 C), temperature source Axillary, resp. rate 18, height 5\' 4"  (1.626 m), weight 104.1 kg, SpO2 100 %.    Vent Mode: PRVC FiO2 (%):  [30 %] 30 % Set Rate:  [15 bmp] 15 bmp Vt Set:  [430 mL] 430 mL PEEP:  [5 cmH20] 5 cmH20 Pressure Support:  [15 cmH20] 15 cmH20 Plateau Pressure:  [20 cmH20-22 cmH20] 20 cmH20   Intake/Output Summary (Last 24 hours) at 07/04/2020 1650 Last data filed at 07/04/2020 0600 Gross per 24 hour  Intake 1348.49 ml  Output 250 ml  Net 1098.49 ml   Filed Weights   07/02/20 0500 07/03/20 0500 07/04/20 0500  Weight: 103.7 kg 104.2 kg 104.1 kg   Physical Exam: General: Acute on chronic ill appearing middle aged female lying in bed on mechanical ventilation, in NAD HEENT: trach c/d, MM pink/moist, PERRL,  Neuro:  Generally closed eyes, does not follow commands, grimaces to pain   CV: s1s2 regular rate and rhythm, no murmur, rubs, or gallops,  PULM:  Clear to ascultation bilaterally, some airleak around trach, no added breath sound.  Drop in tidal volume when switched to PSV.  Copious  secretions continue. GI: soft, bowel sounds active in all 4 quadrants, non-tender, non-distended, tolerating TF Extremities: warm/dry, no edema  Skin: no rashes or lesions  Resolved Hospital Problem list   Aspiration pneumonia--received 5d course of unasyn 1/8-1/12  Assessment & Plan:   Aneurysmal subarachnoid hemorrhage due to ruptured left MCA aneurysm s/p coiling.  Hunt & Hess score 1, Fisher grade 1. 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  Will perform EEG to rule out recurrent seizures as cause for decreased mental status. Have decreased Keppra dose and stopped melatonin as these may be causing increased somnolence.  Electrolytes normal. Alternatively increased somnolence is due to aspiration pneumonia.  No large infiltrate on chest x-ray.  Will complete 5 days of antibiotics.  Follow mental status. Started chest physiotherapy for copious secretions. Removed alternate staples today  Best practice (evaluated daily)  Diet: NPO, tube feeds Pain/Anxiety/Delirium protocol (if indicated): prn fentanyl, delirium precautions VAP protocol (if indicated): Per protocol DVT prophylaxis: SCDs only  GI prophylaxis: PPI Glucose control: trend, with goal FS 140-180 Mobility: Bedrest Disposition: ICU Family: Daughter updated today at bedside.  Goals of Care:  Last date of multidisciplinary goals of care discussion: 1/16 Family and staff present: both daughters, [name] and [place], PCCM Summary of discussion: Continue full aggressive care Follow up goals of care discussion due: daughters wish to pursue full scope of care (1/16) including tracheostomy placement  Code Status: Full   Labs   CBC: Recent Labs  Lab 07/01/20 0422 07/04/20 0721  WBC 16.8* 14.5*   NEUTROABS  --  11.7*  HGB 8.0* 8.5*  HCT 25.1* 26.4*  MCV 90.9 88.9  PLT 270 355    Basic Metabolic Panel: Recent Labs  Lab 06/28/20 0528 07/01/20 0422 07/03/20 1207 07/04/20 0721  NA 138 135 137 142  K 4.3 4.1 3.8 3.8  CL 101 97* 100 103  CO2 30 31 26 29   GLUCOSE 118* 97 131* 97  BUN 18 22* 36* 33*  CREATININE 0.62 0.80 0.93 0.82  CALCIUM 10.4* 10.6* 11.0* 10.9*   GFR: Estimated Creatinine Clearance: 85.8 mL/min (by C-G formula based on SCr of 0.82 mg/dL). Recent Labs  Lab 07/01/20 0422 07/04/20 0721  WBC 16.8* 14.5*    Liver Function Tests: No results for input(s): AST, ALT, ALKPHOS, BILITOT, PROT, ALBUMIN in the last 168 hours. No results for input(s): LIPASE, AMYLASE in the last 168 hours. Recent Labs  Lab 06/29/20 0406  AMMONIA 18    ABG    Component Value Date/Time   PHART 7.560 (H) 06/19/2020 1513   PCO2ART 31.6 (L) 06/19/2020 1513   PO2ART 138 (H) 06/19/2020 1513   HCO3 28.5 (H) 06/19/2020 1513   TCO2 29 06/19/2020 1513   ACIDBASEDEF 1.0 06/17/2020 0939   O2SAT 99.0 06/19/2020 1513      CBG: Recent Labs  Lab 07/03/20 2330 07/04/20 0312 07/04/20 0754 07/04/20 1241 07/04/20 1611  GLUCAP 108* 93 96 101* 91   CRITICAL CARE Performed by: 07/06/20  Total critical care time: 52  minutes  Critical care time was exclusive of separately billable procedures and treating other patients.  Critical care was necessary to treat or prevent imminent or life-threatening deterioration.  Critical care was time spent personally by me on the following activities: development of treatment plan with patient and/or surrogate as well as nursing, discussions with consultants, evaluation of patient's response to treatment, examination of patient, obtaining history from patient or surrogate, ordering and performing treatments and interventions, ordering and review of laboratory studies, ordering and review of radiographic studies, pulse oximetry and  re-evaluation of patient's condition.  Lynnell Catalan, MD [facility] ICU Physician [facility] [place] [facility]  Pager: [phone] Or Epic Secure Chat After hours: [phone].  07/04/2020, 4:50 PM

## 2020-07-05 LAB — CBC WITH DIFFERENTIAL/PLATELET
Abs Immature Granulocytes: 0.13 10*3/uL — ABNORMAL HIGH (ref 0.00–0.07)
Basophils Absolute: 0.1 10*3/uL (ref 0.0–0.1)
Basophils Relative: 1 %
Eosinophils Absolute: 0.3 10*3/uL (ref 0.0–0.5)
Eosinophils Relative: 2 %
HCT: 25.3 % — ABNORMAL LOW (ref 36.0–46.0)
Hemoglobin: 8.1 g/dL — ABNORMAL LOW (ref 12.0–15.0)
Immature Granulocytes: 1 %
Lymphocytes Relative: 10 %
Lymphs Abs: 1.1 10*3/uL (ref 0.7–4.0)
MCH: 28.8 pg (ref 26.0–34.0)
MCHC: 32 g/dL (ref 30.0–36.0)
MCV: 90 fL (ref 80.0–100.0)
Monocytes Absolute: 1.6 10*3/uL — ABNORMAL HIGH (ref 0.1–1.0)
Monocytes Relative: 15 %
Neutro Abs: 7.6 10*3/uL (ref 1.7–7.7)
Neutrophils Relative %: 71 %
Platelets: 360 10*3/uL (ref 150–400)
RBC: 2.81 MIL/uL — ABNORMAL LOW (ref 3.87–5.11)
RDW: 15.8 % — ABNORMAL HIGH (ref 11.5–15.5)
WBC: 10.6 10*3/uL — ABNORMAL HIGH (ref 4.0–10.5)
nRBC: 0.2 % (ref 0.0–0.2)

## 2020-07-05 LAB — BASIC METABOLIC PANEL
Anion gap: 9 (ref 5–15)
BUN: 28 mg/dL — ABNORMAL HIGH (ref 6–20)
CO2: 30 mmol/L (ref 22–32)
Calcium: 10.9 mg/dL — ABNORMAL HIGH (ref 8.9–10.3)
Chloride: 105 mmol/L (ref 98–111)
Creatinine, Ser: 0.75 mg/dL (ref 0.44–1.00)
GFR, Estimated: >60 mL/min (ref >60)
Glucose, Bld: 95 mg/dL (ref 70–99)
Potassium: 3.6 mmol/L (ref 3.5–5.1)
Sodium: 144 mmol/L (ref 135–145)

## 2020-07-05 LAB — GLUCOSE, CAPILLARY
Glucose-Capillary: 87 mg/dL (ref 70–99)
Glucose-Capillary: 87 mg/dL (ref 70–99)
Glucose-Capillary: 106 mg/dL — ABNORMAL HIGH (ref 70–99)
Glucose-Capillary: 86 mg/dL (ref 70–99)
Glucose-Capillary: 77 mg/dL (ref 70–99)
Glucose-Capillary: 93 mg/dL (ref 70–99)

## 2020-07-05 NOTE — Progress Notes (Signed)
  NEUROSURGERY PROGRESS NOTE   No issues overnight.   EXAM:  BP (!) 141/88   Pulse 99   Temp 98.9 F (37.2 C) (Axillary)   Resp (!) 23   Ht 5\' 4"  (1.626 m)   Wt 104.1 kg   SpO2 100%   BMI 39.39 kg/m   Eyes open spontaneously Not tracking Breathing over vent Localizes LUE, w/d LLE No movement on right Wound c/d/i  IMPRESSION: - 60 y.o. female s/p SAH and large LMCA territory stroke, neurologically unchanged with aphasia and right hemiplegia - EEG demonstrating PLEDs - Likely aspiration pna  PLAN: - Cont supportive care, vent mgmt per PCCM - Cont Keppra, VPA - Cont Unasyn   66, MD [facility]

## 2020-07-05 NOTE — Progress Notes (Signed)
NAME:  Denise Savage, MRN:  [id number], DOB:  01/07/1961, LOS: 19 ADMISSION DATE:  06/15/2020, CONSULTATION DATE:  06/16/20 REFERRING MD:  Dr. Maurice Small, CHIEF COMPLAINT:  VDRF   Brief History:  60 year old female presented headache x 5 days found to have left sylvian fissure subarachnoid hemorrhage with a left MCA bifurcation aneurysm.  Transferred to [facility] for further care and went for diagnostic angiography with coiling, complicated by intraoperative rupture and subsequent severe vasospasm leading to malignant left MCA stroke.  She returned to the ICU intubated.  Past Medical History:   has a past medical history of Abdominal hernia.  Significant Hospital Events:  1/3 present to [facility] emergency department 1/4 transferred to [facility], NSGY admitting/ taken to neuro IR for coiling.  Remained on vent postop 1/5 more lethargic and unresponsive, stat head CT was done which showed progressive left MCA infarct with brain edema and 6 mm midline shift.  Persistent nonprogressive subarachnoid hemorrhage. 1/6 worsening edema on CT, 3% saline continued 1/7 Keppra increased for subclinical seizures on LTM EEG 1/8 Low-grade febrile 100.9 1/12 modafinil started  Consults:  Neuro IR PCCM  Procedures:  1/4 diagnostic angiogram with coiling 1/7 left hemicraniectomy 1/7 R IJ central line 1/19 PEG tube 1/19 tracheostomy  Significant Diagnostic Tests:  1/3 >> CT angiogram head neck > small volume acute subarachnoid hemorrhage centered at the base of the left sylvian fissure.  6 x 6 x 9 mm irregular aneurysm arising from the left MCA bifurcation.  1/5 CTH >> Diffused and partially reabsorbed subarachnoid hemorrhage since yesterday. 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  1/6 CTH >> Progressive swelling of the left MCA infarct with new midline shift measuring 6 mm. Nonprogressive subarachnoid hemorrhage. No evidence of new territory infarct  1/6 CTH  >> increasing midline  shift 8 mm  1/6 >> LTM EEG evidence of epileptogenicity arising from right parietal region , no seizures  1/8 LTM EEG >> evidence ofcortical dysfunctionand epileptogenicity in left frontal region consistent with underlying craniotomy andstructural abnormality. After propofol was stopped, eeg showed periodic epileptiform discharges at 1-1.5hz  In left frontal region which were intermittent and rhythmic which is on the ictal-interictal continuum , worse compared to 1/7  1/9 LTM EEG similar to 1/8  1/10 echocardiogram>>EF 60-65%. No valvular abnormalities. Greater than 50% respiratory variability of IVC  Micro Data:  1/3 SARS/ flu >>neg 1/4 MRSA  >> neg 1/8 resp >> ng  Antimicrobials:  Unasyn 1/8>>1/12 Unasyn 1/20>>>  Interim History / Subjective:   More awake today with better eye opening.  Now makes contact when spoken to.  Objective   Blood pressure (!) 122/91, pulse 98, temperature 99.1 F (37.3 C), temperature source Axillary, resp. rate 17, height 5\' 4"  (1.626 m), weight 104.1 kg, SpO2 100 %.    Vent Mode: PRVC FiO2 (%):  [30 %] 30 % Set Rate:  [15 bmp] 15 bmp Vt Set:  [430 mL] 430 mL PEEP:  [5 cmH20] 5 cmH20 Pressure Support:  [15 cmH20] 15 cmH20 Plateau Pressure:  [19 cmH20-22 cmH20] 22 cmH20   Intake/Output Summary (Last 24 hours) at 07/05/2020 1344 Last data filed at 07/05/2020 1200 Gross per 24 hour  Intake 1639.7 ml  Output 900 ml  Net 739.7 ml   Filed Weights   07/02/20 0500 07/03/20 0500 07/04/20 0500  Weight: 103.7 kg 104.2 kg 104.1 kg   Physical Exam: General: Acute on chronic ill appearing middle aged female lying in bed on mechanical ventilation, in NAD  HEENT: trach c/d, MM pink/moist, PERRL,  Neuro: Generally closed eyes, does not follow commands, grimaces to pain   CV: s1s2 regular rate and rhythm, no murmur, rubs, or gallops,  PULM:  Clear to ascultation bilaterally, no airleak around trach, no added breath sound.  Improved secretions.  Is now  able to tolerate PSV 15/5 but tidal volume dropped significantly on PSV 10/5.   GI: soft, bowel sounds active in all 4 quadrants, non-tender, non-distended, tolerating TF Extremities: warm/dry, no edema  Skin: no rashes or lesions  Resolved Hospital Problem list   Aspiration pneumonia--received 5d course of unasyn 1/8-1/12  Assessment & Plan:   Aneurysmal subarachnoid hemorrhage due to ruptured left MCA aneurysm s/p coiling.  Hunt & Hess score 1, Fisher grade 1. 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  Will perform EEG to rule out recurrent seizures as cause for decreased mental status. Have decreased Keppra dose and stopped melatonin as these may be causing increased somnolence.  Electrolytes normal. Alternatively increased somnolence is due to aspiration pneumonia.  No large infiltrate on chest x-ray.  Will complete 5 days of antibiotics.  Mental status is now improving as aspiration pneumonia appears to be resolving. Still not tolerating sufficiently low support to proceed with trach collar trial. Continue daily trach collar trial  Best practice (evaluated daily)  Diet: NPO, tube feeds Pain/Anxiety/Delirium protocol (if indicated): prn fentanyl, delirium precautions VAP protocol (if indicated): Per protocol DVT prophylaxis: SCDs only  GI prophylaxis: PPI Glucose control: trend, with goal FS 140-180 Mobility: Bedrest Disposition: ICU Family: Daughter updated today at bedside.  Goals of Care:  Last date of multidisciplinary goals of care discussion: 1/16 Family and staff present: both daughters, [name] and [place], PCCM Summary of discussion: Continue full aggressive care Follow up goals of care discussion due: daughters wish to pursue full scope of care  (1/16) including tracheostomy placement  Code Status: Full   Labs   CBC: Recent Labs  Lab 07/01/20 0422 07/04/20 0721 07/05/20 0351  WBC 16.8* 14.5* 10.6*  NEUTROABS  --  11.7* 7.6  HGB 8.0* 8.5* 8.1*  HCT 25.1* 26.4* 25.3*  MCV 90.9 88.9 90.0  PLT 270 355 360    Basic Metabolic Panel: Recent Labs  Lab 07/01/20 0422 07/03/20 1207 07/04/20 0721 07/05/20 0351  NA 135 137 142 144  K 4.1 3.8 3.8 3.6  CL 97* 100 103 105  CO2 31 26 29 30   GLUCOSE 97 131* 97 95  BUN 22* 36* 33* 28*  CREATININE 0.80 0.93 0.82 0.75  CALCIUM 10.6* 11.0* 10.9* 10.9*   GFR: Estimated Creatinine Clearance: 88 mL/min (by C-G formula based on SCr of 0.75 mg/dL). Recent Labs  Lab 07/01/20 0422 07/04/20 0721 07/05/20 0351  WBC 16.8* 14.5* 10.6*    Liver Function Tests: No results for input(s): AST, ALT, ALKPHOS, BILITOT, PROT, ALBUMIN in the last 168 hours. No results for input(s): LIPASE, AMYLASE in the last 168 hours. Recent Labs  Lab 06/29/20 0406  AMMONIA 18    ABG    Component Value Date/Time   PHART 7.560 (H) 06/19/2020 1513   PCO2ART 31.6 (L) 06/19/2020 1513   PO2ART 138 (H) 06/19/2020 1513   HCO3 28.5 (H) 06/19/2020 1513   TCO2 29 06/19/2020 1513   ACIDBASEDEF 1.0 06/17/2020 0939   O2SAT 99.0  06/19/2020 1513      CBG: Recent Labs  Lab 07/04/20 1941 07/04/20 2317 07/05/20 0305 07/05/20 0736 07/05/20 1131  GLUCAP 82 90 87 87 106*   CRITICAL CARE Performed by: Lynnell Catalan  Total critical care time: 35 minutes  Critical care time was exclusive of separately billable procedures and treating other patients.  Critical care was necessary to treat or prevent imminent or life-threatening deterioration.  Critical care was time spent personally by me on the following activities: development of treatment plan with patient and/or surrogate as well as nursing, discussions with consultants, evaluation of patient's response to treatment, examination of patient, obtaining  history from patient or surrogate, ordering and performing treatments and interventions, ordering and review of laboratory studies, ordering and review of radiographic studies, pulse oximetry and re-evaluation of patient's condition.  Lynnell Catalan, MD [facility] ICU Physician [facility]  Pager: [phone] Or Epic Secure Chat After hours: [phone].  07/05/2020, 1:44 PM

## 2020-07-05 NOTE — Progress Notes (Signed)
RT NOTE: PT's bed in chair position. Unable to perform CPT through bed at this time. Will resume CPT at next scheduled time. VSS. RT will continue to monitor.

## 2020-07-05 NOTE — Progress Notes (Signed)
Patient placed back on full vent support. Breathing 42 times a minute with increased WOB.

## 2020-07-06 LAB — CBC WITH DIFFERENTIAL/PLATELET
Abs Immature Granulocytes: 0.2 10*3/uL — ABNORMAL HIGH (ref 0.00–0.07)
Basophils Absolute: 0.1 10*3/uL (ref 0.0–0.1)
Basophils Relative: 1 %
Eosinophils Absolute: 0.3 10*3/uL (ref 0.0–0.5)
Eosinophils Relative: 4 %
HCT: 29.7 % — ABNORMAL LOW (ref 36.0–46.0)
Hemoglobin: 9 g/dL — ABNORMAL LOW (ref 12.0–15.0)
Immature Granulocytes: 3 %
Lymphocytes Relative: 16 %
Lymphs Abs: 1.2 10*3/uL (ref 0.7–4.0)
MCH: 27.4 pg (ref 26.0–34.0)
MCHC: 30.3 g/dL (ref 30.0–36.0)
MCV: 90.5 fL (ref 80.0–100.0)
Monocytes Absolute: 1 10*3/uL (ref 0.1–1.0)
Monocytes Relative: 14 %
Neutro Abs: 4.6 10*3/uL (ref 1.7–7.7)
Neutrophils Relative %: 62 %
Platelets: 433 10*3/uL — ABNORMAL HIGH (ref 150–400)
RBC: 3.28 MIL/uL — ABNORMAL LOW (ref 3.87–5.11)
RDW: 15.9 % — ABNORMAL HIGH (ref 11.5–15.5)
WBC: 7.4 10*3/uL (ref 4.0–10.5)
nRBC: 1 % — ABNORMAL HIGH (ref 0.0–0.2)

## 2020-07-06 LAB — BASIC METABOLIC PANEL
Anion gap: 11 (ref 5–15)
BUN: 22 mg/dL — ABNORMAL HIGH (ref 6–20)
CO2: 30 mmol/L (ref 22–32)
Calcium: 10.8 mg/dL — ABNORMAL HIGH (ref 8.9–10.3)
Chloride: 102 mmol/L (ref 98–111)
Creatinine, Ser: 0.65 mg/dL (ref 0.44–1.00)
GFR, Estimated: >60 mL/min (ref >60)
Glucose, Bld: 98 mg/dL (ref 70–99)
Potassium: 3.8 mmol/L (ref 3.5–5.1)
Sodium: 143 mmol/L (ref 135–145)

## 2020-07-06 LAB — GLUCOSE, CAPILLARY
Glucose-Capillary: 80 mg/dL (ref 70–99)
Glucose-Capillary: 96 mg/dL (ref 70–99)
Glucose-Capillary: 79 mg/dL (ref 70–99)
Glucose-Capillary: 104 mg/dL — ABNORMAL HIGH (ref 70–99)
Glucose-Capillary: 101 mg/dL — ABNORMAL HIGH (ref 70–99)
Glucose-Capillary: 94 mg/dL (ref 70–99)

## 2020-07-06 NOTE — Progress Notes (Signed)
  NEUROSURGERY PROGRESS NOTE   No issues overnight.   EXAM:  BP (!) 196/93   Pulse 85   Temp 98.5 F (36.9 C) (Axillary)   Resp (!) 22   Ht 5\' 4"  (1.626 m)   Wt 104.1 kg   SpO2 100%   BMI 39.39 kg/m   Eyes open spontaneously ? Tracking but left gaze preference Breathing spontaneously on CPAP Localizes LUE, w/d LLE No movement on right Wound c/d/i  IMPRESSION: - 60 y.o. female s/p SAH and large LMCA territory stroke, neurologically stable (global aphasia and right hemiplegia) - Previous EEG demonstrating PLEDs - Likely aspiration pna on abx  PLAN: - Cont supportive care, transition to trach collar per PCCM - Cont Keppra, VPA - Cont Unasyn to complete 5d course   66, MD [facility]

## 2020-07-06 NOTE — TOC Progression Note (Addendum)
Transition of Care [facility]) - Progression Note    Patient Details  Name: Denise Savage MRN: [id number] Date of Birth: 05-Sep-1960  Transition of Care [facility]) CM/SW Contact  Lorri Frederick, LCSW Phone Number: 07/06/2020, 9:56 AM  Clinical Narrative: PASSR completed: [id number]    Letter requested by financial counseling requires MD signature--faxed to Dr Val Riles office and also placed on pt chart.  Will forward to financial counseling once signed.  1330: signed letter received back from Dr Conchita Paris.  Forwarded to [facility]/financial counseling.      Expected Discharge Plan: Skilled Nursing Facility Barriers to Discharge: Continued Medical Work up,SNF Pending bed offer  Expected Discharge Plan and Services Expected Discharge Plan: Skilled Nursing Facility     Post Acute Care Choice: Skilled Nursing Facility Living arrangements for the past 2 months: Single Family Home                                       Social Determinants of Health (SDOH) Interventions    Readmission Risk Interventions No flowsheet data found.

## 2020-07-06 NOTE — Progress Notes (Addendum)
NAME:  Denise Savage, MRN:  [id number], DOB:  01-09-1961, LOS: 20 ADMISSION DATE:  06/15/2020, CONSULTATION DATE:  06/16/20 REFERRING MD:  Dr. Maurice Small, CHIEF COMPLAINT:  VDRF   Brief History:  60 year old female presented headache x 5 days found to have left sylvian fissure subarachnoid hemorrhage with a left MCA bifurcation aneurysm.  Transferred to [facility] for further care and went for diagnostic angiography with coiling, complicated by intraoperative rupture and subsequent severe vasospasm leading to malignant left MCA stroke.  She returned to the ICU intubated.  Past Medical History:   has a past medical history of Abdominal hernia.  Significant Hospital Events:  1/3 present to [facility] emergency department 1/4 transferred to [facility], NSGY admitting/ taken to neuro IR for coiling.  Remained on vent postop 1/5 more lethargic and unresponsive, stat head CT was done which showed progressive left MCA infarct with brain edema and 6 mm midline shift.  Persistent nonprogressive subarachnoid hemorrhage. 1/6 worsening edema on CT, 3% saline continued 1/7 Keppra increased for subclinical seizures on LTM EEG 1/8 Low-grade febrile 100.9 1/12 modafinil started  Consults:  Neuro IR PCCM  Procedures:  1/4 diagnostic angiogram with coiling 1/7 left hemicraniectomy 1/7 R IJ central line >> out 1/19 PEG tube 1/19 tracheostomy  Significant Diagnostic Tests:  1/3 >> CT angiogram head neck > small volume acute subarachnoid hemorrhage centered at the base of the left sylvian fissure.  6 x 6 x 9 mm irregular aneurysm arising from the left MCA bifurcation.  1/5 CTH >> Diffused and partially reabsorbed subarachnoid hemorrhage since yesterday. 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  1/6 CTH >> Progressive swelling of the left MCA infarct with new midline shift measuring 6 mm. Nonprogressive subarachnoid hemorrhage. No evidence of new territory infarct  1/6 CTH  >> increasing  midline shift 8 mm  1/6 >> LTM EEG evidence of epileptogenicity arising from right parietal region , no seizures  1/8 LTM EEG >> evidence ofcortical dysfunctionand epileptogenicity in left frontal region consistent with underlying craniotomy andstructural abnormality. After propofol was stopped, eeg showed periodic epileptiform discharges at 1-1.5hz  In left frontal region which were intermittent and rhythmic which is on the ictal-interictal continuum , worse compared to 1/7  1/9 LTM EEG similar to 1/8  1/10 echocardiogram>>EF 60-65%. No valvular abnormalities. Greater than 50% respiratory variability of IVC EEG 1/21 PL EDS left frontal region  Micro Data:  1/3 SARS/ flu >>neg 1/4 MRSA  >> neg 1/8 resp >> ng  Antimicrobials:  Unasyn 1/8>>1/12 Unasyn 1/20>>>  Interim History / Subjective:   Critically ill, remains on vent Afebrile Poor urine output  Objective   Blood pressure (!) 196/93, pulse 85, temperature 99.1 F (37.3 C), temperature source Axillary, resp. rate (!) 22, height 5\' 4"  (1.626 m), weight 104.1 kg, SpO2 100 %.    Vent Mode: PSV;CPAP FiO2 (%):  [30 %] 30 % Set Rate:  [15 bmp] 15 bmp Vt Set:  [430 mL-460 mL] 460 mL PEEP:  [5 cmH20] 5 cmH20 Pressure Support:  [15 cmH20] 15 cmH20 Plateau Pressure:  [18 cmH20-22 cmH20] 19 cmH20   Intake/Output Summary (Last 24 hours) at 07/06/2020 0836 Last data filed at 07/06/2020 0700 Gross per 24 hour  Intake 1620 ml  Output 750 ml  Net 870 ml   Filed Weights   07/02/20 0500 07/03/20 0500 07/04/20 0500  Weight: 103.7 kg 104.2 kg 104.1 kg   Physical Exam: Gen:      Elderly woman , no distress  HEENT:  EOMI, sclera anicteric, mild pallor Neck:     No JVD; no thyromegaly, tracheostomy site appears clean Lungs:    Bilateral ventilated breath sounds CV:         Regular rate and rhythm; no murmurs Abd:      + bowel sounds; soft, non-tender; no palpable masses, no distension Ext:    No edema; adequate peripheral  perfusion Skin:      Warm and dry; no rash Neuro: Does not follow commands this morning, eyes open spontaneously Rt hemiplegia  Chest x-ray 1/20 personally reviewed shows low lung volumes with bibasilar atelectasis Labs from 1/22 showed no leukocytosis, normal electrolytes mild hypokalemia  Resolved Hospital Problem list   Aspiration pneumonia--received 5d course of unasyn 1/8-1/12  Assessment & Plan:   Aneurysmal subarachnoid hemorrhage due to ruptured left MCA aneurysm s/p coiling.  Hunt & Hess score 1, Fisher grade 1. 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 and midline shift s/p hemi-craniectomy 1/7 EEG 1/21 PL EDS left frontal region  Plan:  Reassuring CT head.  Await EEG Have decreased Keppra dose and stopped melatonin    Status post tracheostomy Status post PEG tube  aspiration pneumonia Acute respiratory failure with hypoxia require mechanical ventilation  -Weans on pressure support 15/5 but low tidal volumes , goal is trach collar but progress towards this seems to be stalled -Plan for 5 days of Unasyn until 1/25, repeat chest x-ray tomorrow   Best practice (evaluated daily)  Diet: NPO, tube feeds Pain/Anxiety/Delirium protocol (if indicated): prn fentanyl, delirium precautions VAP protocol (if indicated): Per protocol DVT prophylaxis: SCDs only  GI prophylaxis: PPI Glucose control: trend, with goal FS 140-180 Mobility: Bedrest Disposition: ICU Family: Daughter updated today at bedside.  Goals of Care:  Last date of multidisciplinary goals of care discussion: 1/16 Family and staff present: both daughters, [name] and [place], PCCM Summary of discussion: Continue full aggressive care Follow up goals of care discussion due: daughters wish to pursue full scope of care (1/16) including tracheostomy placement  Code Status: Full   Labs   CBC: Recent Labs  Lab 07/01/20 0422 07/04/20 0721 07/05/20 0351  WBC 16.8* 14.5* 10.6*  NEUTROABS  --  11.7*  7.6  HGB 8.0* 8.5* 8.1*  HCT 25.1* 26.4* 25.3*  MCV 90.9 88.9 90.0  PLT 270 355 360    Basic Metabolic Panel: Recent Labs  Lab 07/01/20 0422 07/03/20 1207 07/04/20 0721 07/05/20 0351  NA 135 137 142 144  K 4.1 3.8 3.8 3.6  CL 97* 100 103 105  CO2 31 26 29 30   GLUCOSE 97 131* 97 95  BUN 22* 36* 33* 28*  CREATININE 0.80 0.93 0.82 0.75  CALCIUM 10.6* 11.0* 10.9* 10.9*   GFR: Estimated Creatinine Clearance: 88 mL/min (by C-G formula based on SCr of 0.75 mg/dL). Recent Labs  Lab 07/01/20 0422 07/04/20 0721 07/05/20 0351  WBC 16.8* 14.5* 10.6*    Liver Function Tests: No results for input(s): AST, ALT, ALKPHOS, BILITOT, PROT, ALBUMIN in the last 168 hours. No results for input(s): LIPASE, AMYLASE in the last 168 hours. No results for input(s): AMMONIA in the last 168 hours.  ABG    Component Value Date/Time   PHART 7.560 (H) 06/19/2020 1513   PCO2ART 31.6 (L) 06/19/2020 1513   PO2ART 138 (H) 06/19/2020 1513   HCO3 28.5 (H) 06/19/2020 1513   TCO2 29 06/19/2020 1513   ACIDBASEDEF 1.0 06/17/2020 0939   O2SAT 99.0 06/19/2020 1513  CBG: Recent Labs  Lab 07/05/20 1514 07/05/20 1937 07/05/20 2326 07/06/20 0323 07/06/20 0740  GLUCAP 86 77 93 80 96   CRITICAL CARE Performed by: Comer Locket. Taliya Mcclard  Total critical care time: 31 minutes   Cyril Mourning MD. FCCP. [facility] See Amion for pager  If no response to pager , please call [phone]  After 7:00 pm call Elink  [phone]     07/06/2020, 8:36 AM

## 2020-07-07 ENCOUNTER — Inpatient Hospital Stay (HOSPITAL_COMMUNITY): Payer: Medicaid Other

## 2020-07-07 LAB — CBC WITH DIFFERENTIAL/PLATELET
Abs Immature Granulocytes: 0.31 10*3/uL — ABNORMAL HIGH (ref 0.00–0.07)
Basophils Absolute: 0.1 10*3/uL (ref 0.0–0.1)
Basophils Relative: 1 %
Eosinophils Absolute: 0.3 10*3/uL (ref 0.0–0.5)
Eosinophils Relative: 4 %
HCT: 30.8 % — ABNORMAL LOW (ref 36.0–46.0)
Hemoglobin: 9.7 g/dL — ABNORMAL LOW (ref 12.0–15.0)
Immature Granulocytes: 4 %
Lymphocytes Relative: 19 %
Lymphs Abs: 1.4 10*3/uL (ref 0.7–4.0)
MCH: 28.4 pg (ref 26.0–34.0)
MCHC: 31.5 g/dL (ref 30.0–36.0)
MCV: 90.3 fL (ref 80.0–100.0)
Monocytes Absolute: 1.1 10*3/uL — ABNORMAL HIGH (ref 0.1–1.0)
Monocytes Relative: 14 %
Neutro Abs: 4.6 10*3/uL (ref 1.7–7.7)
Neutrophils Relative %: 58 %
Platelets: 428 10*3/uL — ABNORMAL HIGH (ref 150–400)
RBC: 3.41 MIL/uL — ABNORMAL LOW (ref 3.87–5.11)
RDW: 16 % — ABNORMAL HIGH (ref 11.5–15.5)
WBC: 7.7 10*3/uL (ref 4.0–10.5)
nRBC: 1.6 % — ABNORMAL HIGH (ref 0.0–0.2)

## 2020-07-07 LAB — BASIC METABOLIC PANEL
Anion gap: 10 (ref 5–15)
BUN: 23 mg/dL — ABNORMAL HIGH (ref 6–20)
CO2: 29 mmol/L (ref 22–32)
Calcium: 10.5 mg/dL — ABNORMAL HIGH (ref 8.9–10.3)
Chloride: 102 mmol/L (ref 98–111)
Creatinine, Ser: 0.67 mg/dL (ref 0.44–1.00)
GFR, Estimated: >60 mL/min (ref >60)
Glucose, Bld: 111 mg/dL — ABNORMAL HIGH (ref 70–99)
Potassium: 3.6 mmol/L (ref 3.5–5.1)
Sodium: 141 mmol/L (ref 135–145)

## 2020-07-07 LAB — GLUCOSE, CAPILLARY
Glucose-Capillary: 82 mg/dL (ref 70–99)
Glucose-Capillary: 101 mg/dL — ABNORMAL HIGH (ref 70–99)
Glucose-Capillary: 106 mg/dL — ABNORMAL HIGH (ref 70–99)
Glucose-Capillary: 117 mg/dL — ABNORMAL HIGH (ref 70–99)
Glucose-Capillary: 105 mg/dL — ABNORMAL HIGH (ref 70–99)
Glucose-Capillary: 82 mg/dL (ref 70–99)

## 2020-07-07 LAB — PHOSPHORUS: Phosphorus: 3.2 mg/dL (ref 2.5–4.6)

## 2020-07-07 LAB — MAGNESIUM: Magnesium: 1.9 mg/dL (ref 1.7–2.4)

## 2020-07-07 MED ORDER — FUROSEMIDE 10 MG/ML IJ SOLN
40.0000 mg | Freq: Once | INTRAMUSCULAR | Status: AC
  Administered 2020-07-07: 40 mg via INTRAVENOUS
  Filled 2020-07-07: qty 4

## 2020-07-07 NOTE — Progress Notes (Signed)
Nutrition Follow-up  DOCUMENTATION CODES:   Obesity unspecified  INTERVENTION:   Tube Feeding via Cortrak tube:  Osmolite 1.2 @ 55 ml/hr (1320 ml) 90 ml ProSource BID  Provides: 1744 kcal, 117 grams protein, 208 grams carbohydrate, and 1070 ml free water.   NUTRITION DIAGNOSIS:   Inadequate oral intake related to inability to eat as evidenced by NPO status. Ongoing.   GOAL:   Patient will meet greater than or equal to 90% of their needs Met with TF.   MONITOR:   TF tolerance  REASON FOR ASSESSMENT:   Ventilator    ASSESSMENT:   Pt with PMH of abdominal hernia admitted with L MCA aneurysm s/p coiling complicated by intraoperative rupture and subsequent severe vasospasm.   Per MD will need prolonged wean from vent, discharge plan being discussed.   1/5 cortrak placed; tip gastric  1/7 hemi-craniectomy 1/19 s/p trach and PEG placement   Patient is currently intubated on ventilator support MV: 7.7 L/min Temp (24hrs), Avg:98.8 F (37.1 C), Min:98 F (36.7 C), Max:99.2 F (37.3 C)   Medications and labs reviewed     Diet Order:   Diet Order            Diet NPO time specified  Diet effective midnight                 EDUCATION NEEDS:   No education needs have been identified at this time  Skin:  Skin Assessment: Reviewed RN Assessment  Last BM:  1/25 type 7; rectal tube in place  Height:   Ht Readings from Last 1 Encounters:  06/30/20 $RemoveB'5\' 4"'[id number]$  (1.626 m)    Weight:   Wt Readings from Last 1 Encounters:  07/07/20 100.6 kg    Ideal Body Weight:  54.4 kg  BMI:  Body mass index is 38.07 kg/m.  Estimated Nutritional Needs:   Kcal:  1600-1800  Protein:  105-125 grams  Fluid:  >1.6 L/day  Lockie Pares., RD, LDN, CNSC See AMiON for contact information

## 2020-07-07 NOTE — Progress Notes (Signed)
  NEUROSURGERY PROGRESS NOTE   No issues overnight.  No changes neurologically  EXAM:  BP (!) 143/90   Pulse 72   Temp 98.7 F (37.1 C) (Axillary)   Resp (!) 21   Ht 5\' 4"  (1.626 m)   Wt 100.6 kg   SpO2 100%   BMI 38.07 kg/m   Trach No eye opening to pain Localizes LUE. Withdraws LLE No movement RUE/RLE Wound c/d/i  IMPRESSION/PLAN 60 y.o. female s/p SAH and large LMCA territory stroke, neurologically stable - Likely aspiration pna on abx - likely unable to achieve to trach collar. Will need LTACH - Cont Keppra, VPA - Cont Unasyn to complete 5d course - last day today - appreciate PCCM management of patient

## 2020-07-07 NOTE — Progress Notes (Addendum)
Occupational/ Physical Therapy Discharge Patient Details Name: Denise Savage MRN: [id number] DOB: April 14, 1961 Today's Date: 07/07/2020 Time:  -     Patient discharged from OT / PT services secondary to:  not able to participate at this time. Not on sedation, not following any commands.     Progress and discharge plan discussed with patient and/or caregiver: Patient unable to participate in discharge planning and no caregivers available      Timmothy Euler, OTR/L  Acute Rehabilitation Services Pager: [phone] Office: [phone] .  Mateo Flow 07/07/2020, 10:58 AM    Jerolyn Center, PT Pager [phone]

## 2020-07-07 NOTE — TOC Progression Note (Signed)
Transition of Care [facility]) - Progression Note    Patient Details  Name: Denise Savage MRN: [id number] Date of Birth: [birth year]  [facility]) CM/SW Contact  Lorri Frederick, LCSW Phone Number: 07/07/2020, 2:53 PM  Clinical Narrative: CSW spoke with Prem at [facility]: they do not have medicaid/LOG beds currently.  Referrals faxed to [facility] and [facility].      Expected Discharge Plan: Skilled Nursing Facility Barriers to Discharge: Continued Medical Work up,SNF Pending bed offer  Expected Discharge Plan and Services Expected Discharge Plan: Skilled Nursing Facility     Post Acute Care Choice: Skilled Nursing Facility Living arrangements for the past 2 months: Single Family Home                                       Social Determinants of Health (SDOH) Interventions    Readmission Risk Interventions No flowsheet data found.

## 2020-07-07 NOTE — Progress Notes (Addendum)
NAME:  Denise Savage, MRN:  [id number], DOB:  January 05, 1961, LOS: 21 ADMISSION DATE:  06/15/2020, CONSULTATION DATE:  06/16/20 REFERRING MD:  Dr. Maurice Small, CHIEF COMPLAINT:  VDRF   Brief History:  60 year old female presented headache x 5 days found to have left sylvian fissure subarachnoid hemorrhage with a left MCA bifurcation aneurysm.  Transferred to [facility] for further care and went for diagnostic angiography with coiling, complicated by intraoperative rupture and subsequent severe vasospasm leading to malignant left MCA stroke.  She returned to the ICU intubated.  Past Medical History:   has a past medical history of Abdominal hernia.  Significant Hospital Events:  1/3 present to [facility] emergency department 1/4 transferred to [facility], NSGY admitting/ taken to neuro IR for coiling.  Remained on vent postop 1/5 more lethargic and unresponsive, stat head CT was done which showed progressive left MCA infarct with brain edema and 6 mm midline shift.  Persistent nonprogressive subarachnoid hemorrhage. 1/6 worsening edema on CT, 3% saline continued 1/7 Keppra increased for subclinical seizures on LTM EEG 1/8 Low-grade febrile 100.9 1/12 modafinil started  Consults:  Neuro IR PCCM  Procedures:  1/4 diagnostic angiogram with coiling 1/7 left hemicraniectomy 1/7 R IJ central line >> out 1/19 PEG tube 1/19 tracheostomy  Significant Diagnostic Tests:  1/3 >> CT angiogram head neck > small volume acute subarachnoid hemorrhage centered at the base of the left sylvian fissure.  6 x 6 x 9 mm irregular aneurysm arising from the left MCA bifurcation.  1/5 CTH >> Diffused and partially reabsorbed subarachnoid hemorrhage since yesterday. 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  1/6 CTH >> Progressive swelling of the left MCA infarct with new midline shift measuring 6 mm. Nonprogressive subarachnoid hemorrhage. No evidence of new territory infarct  1/6 CTH  >> increasing  midline shift 8 mm  1/6 >> LTM EEG evidence of epileptogenicity arising from right parietal region , no seizures  1/8 LTM EEG >> evidence ofcortical dysfunctionand epileptogenicity in left frontal region consistent with underlying craniotomy andstructural abnormality. After propofol was stopped, eeg showed periodic epileptiform discharges at 1-1.5hz  In left frontal region which were intermittent and rhythmic which is on the ictal-interictal continuum , worse compared to 1/7  1/9 LTM EEG similar to 1/8  1/10 echocardiogram>>EF 60-65%. No valvular abnormalities. Greater than 50% respiratory variability of IVC EEG 1/21 PL EDS left frontal region  Micro Data:  1/3 SARS/ flu >>neg 1/4 MRSA  >> neg 1/8 resp >> ng  Antimicrobials:  Unasyn 1/8>>1/12 Unasyn 1/20>>>  Interim History / Subjective:   Critically ill, unresponsive Afebrile Urine output improving  Objective   Blood pressure (!) 143/90, pulse 72, temperature 99 F (37.2 C), temperature source Axillary, resp. rate (!) 21, height 5\' 4"  (1.626 m), weight 100.6 kg, SpO2 100 %.    Vent Mode: PSV;CPAP FiO2 (%):  [30 %] 30 % Set Rate:  [15 bmp-125 bmp] 125 bmp Vt Set:  [430 mL] 430 mL PEEP:  [5 cmH20] 5 cmH20 Pressure Support:  [15 cmH20] 15 cmH20 Plateau Pressure:  [17 cmH20-19 cmH20] 17 cmH20   Intake/Output Summary (Last 24 hours) at 07/07/2020 0840 Last data filed at 07/07/2020 0700 Gross per 24 hour  Intake 1745 ml  Output 1300 ml  Net 445 ml   Filed Weights   07/03/20 0500 07/04/20 0500 07/07/20 0500  Weight: 104.2 kg 104.1 kg 100.6 kg   Physical Exam: Gen:      Elderly woman , no distress  HEENT:  EOMI, sclera anicteric, mild pallor, left craniotomy Neck:     No JVD; no thyromegaly, tracheostomy site appears clean Lungs:    No accessory muscle use, clear breath sounds bilateral CV:         Regular rate and rhythm; no murmurs Abd:      + bowel sounds; soft, non-tender; no palpable masses, no distension Ext:     No edema; adequate peripheral perfusion Skin:      Warm and dry; no rash Neuro: Does not follow commands, right hemiplegia,  Chest x-ray 1/25 personally reviewed, bibasilar atelectasis, no effusions Labs from 1/25 shows mild hypokalemia, no leukocytosis, stable anemia  Resolved Hospital Problem list   Aspiration pneumonia--received 5d course of unasyn 1/8-1/12  Assessment & Plan:   Aneurysmal subarachnoid hemorrhage due to ruptured left MCA aneurysm s/p coiling.  Hunt & Hess score 1, Fisher grade 1. 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 and midline shift s/p hemi-craniectomy 1/7 EEG 1/21 PL EDS left frontal region  Plan: Continue Keppra DC Sudafed, continue midodrine    Status post tracheostomy Status post PEG tube  aspiration pneumonia Acute respiratory failure with hypoxia require mechanical ventilation  -Continue weaning efforts on pressure support, goal is trach collar but progress towards this seems to be stalled, will accept low tidal volumes and pushed forward -Diurese with Lasix 40 -Plan for 5 days of Unasyn until 1/25   Best practice (evaluated daily)  Diet: NPO, tube feeds Pain/Anxiety/Delirium protocol (if indicated): prn fentanyl, delirium precautions VAP protocol (if indicated): Per protocol DVT prophylaxis: SCDs only  GI prophylaxis: PPI Glucose control: trend, with goal FS 140-180 Mobility: Bedrest Disposition: ICU Family: Daughter   Goals of Care:  Last date of multidisciplinary goals of care discussion: 1/16 Family and staff present: both daughters, Denise Savage and Denise Savage, PCCM Summary of discussion: Continue full aggressive care Follow up goals of care discussion due: daughters wish to pursue full scope of care (1/16) including tracheostomy placement  Code Status: Full   Labs   CBC: Recent Labs  Lab 07/01/20 0422 07/04/20 0721 07/05/20 0351 07/06/20 0935 07/07/20 0050  WBC 16.8* 14.5* 10.6* 7.4 7.7  NEUTROABS  --  11.7* 7.6  4.6 4.6  HGB 8.0* 8.5* 8.1* 9.0* 9.7*  HCT 25.1* 26.4* 25.3* 29.7* 30.8*  MCV 90.9 88.9 90.0 90.5 90.3  PLT 270 355 360 433* 428*    Basic Metabolic Panel: Recent Labs  Lab 07/03/20 1207 07/04/20 0721 07/05/20 0351 07/06/20 0935 07/07/20 0050  NA 137 142 144 143 141  K 3.8 3.8 3.6 3.8 3.6  CL 100 103 105 102 102  CO2 26 29 30 30 29   GLUCOSE 131* 97 95 98 111*  BUN 36* 33* 28* 22* 23*  CREATININE 0.93 0.82 0.75 0.65 0.67  CALCIUM 11.0* 10.9* 10.9* 10.8* 10.5*  MG  --   --   --   --  1.9  PHOS  --   --   --   --  3.2   GFR: Estimated Creatinine Clearance: 86.3 mL/min (by C-G formula based on SCr of 0.67 mg/dL). Recent Labs  Lab 07/04/20 0721 07/05/20 0351 07/06/20 0935 07/07/20 0050  WBC 14.5* 10.6* 7.4 7.7    Liver Function Tests: No results for input(s): AST, ALT, ALKPHOS, BILITOT, PROT, ALBUMIN in the last 168 hours. No results for input(s): LIPASE, AMYLASE in the last 168 hours. No results for input(s): AMMONIA in the last 168 hours.  ABG    Component Value Date/Time   PHART  7.560 (H) 06/19/2020 1513   PCO2ART 31.6 (L) 06/19/2020 1513   PO2ART 138 (H) 06/19/2020 1513   HCO3 28.5 (H) 06/19/2020 1513   TCO2 29 06/19/2020 1513   ACIDBASEDEF 1.0 06/17/2020 0939   O2SAT 99.0 06/19/2020 1513      CBG: Recent Labs  Lab 07/06/20 1525 07/06/20 1925 07/06/20 2310 07/07/20 0334 07/07/20 0738  GLUCAP 104* 101* 94 82 101*   CRITICAL CARE Performed by: Comer Locket. Zahmir Lalla  Total critical care time: 31 minutes   Cyril Mourning MD. FCCP. [facility] See Amion for pager  If no response to pager , please call [phone]  After 7:00 pm call Elink  [phone]     07/07/2020, 8:40 AM

## 2020-07-07 NOTE — NC FL2 (Signed)
Show Low LEVEL OF CARE SCREENING TOOL     IDENTIFICATION  Patient Name: Denise Savage Birthdate: December 20, 1960 Sex: female Admission Date (Current Location): 06/15/2020  [facility] and [place] Number:  Herbalist and Address:  [facility], [place] [address] [place], [place] [address]      Provider Number: [id number]  Attending Physician Name and Address:  Consuella Lose, MD  Relative Name and Phone Number:  Riki Sheer Daughter   [phone]    Current Level of Care: Hospital Recommended Level of Care: Vent SNF Prior Approval Number:    Date Approved/Denied:   PASRR Number: [id number]  Discharge Plan: SNF    Current Diagnoses: Patient Active Problem List   Diagnosis Date Noted  . Acute respiratory failure ([place])   . ICH (intracerebral hemorrhage) ([place]) 06/18/2020  . Aneurysmal subarachnoid hemorrhage ([place]) 06/16/2020  . SAH (subarachnoid hemorrhage) ([place]) 06/15/2020    Orientation RESPIRATION BLADDER Height & Weight      (not oriented)  Vent External catheter Weight: 221 lb 12.5 oz (100.6 kg) Height:  $Remove'5\' 4"'BFsdPdX$  (162.6 cm)  BEHAVIORAL SYMPTOMS/MOOD NEUROLOGICAL BOWEL NUTRITION STATUS       (rectal tube) Feeding tube  AMBULATORY STATUS COMMUNICATION OF NEEDS Skin   Total Care Does not communicate Surgical wounds                       Personal Care Assistance Level of Assistance  Total care       Total Care Assistance: Maximum assistance   Functional Limitations Info  Sight,Hearing,Speech Sight Info: Adequate Hearing Info: Adequate Speech Info: Impaired (on vent)    SPECIAL CARE FACTORS FREQUENCY                       Contractures Contractures Info: Not present    Additional Factors Info  Code Status,Allergies Code Status Info: full Allergies Info: hydrocodone           Current Medications (07/07/2020):  This is the current hospital active medication list Current Facility-Administered  Medications  Medication Dose Route Frequency Provider Last Rate Last Admin  . acetaminophen (TYLENOL) tablet 650 mg  650 mg Per Tube Q4H PRN Judith Part, MD   650 mg at 07/03/20 1643   Or  . acetaminophen (TYLENOL) suppository 650 mg  650 mg Rectal Q4H PRN Judith Part, MD      . chlorhexidine gluconate (MEDLINE KIT) (PERIDEX) 0.12 % solution 15 mL  15 mL Mouth Rinse BID Judith Part, MD   15 mL at 07/07/20 0800  . Chlorhexidine Gluconate Cloth 2 % PADS 6 each  6 each Topical Q0600 Consuella Lose, MD   6 each at 07/07/20 0000  . feeding supplement (OSMOLITE 1.2 CAL) liquid 1,000 mL  1,000 mL Per Tube Continuous Kipp Brood, MD 55 mL/hr at 07/07/20 0141 1,000 mL at 07/07/20 0141  . feeding supplement (PROSource TF) liquid 90 mL  90 mL Per Tube BID Kipp Brood, MD   90 mL at 07/07/20 1204  . heparin injection 5,000 Units  5,000 Units Subcutaneous Q8H Kipp Brood, MD   5,000 Units at 07/07/20 0531  . HYDROcodone-acetaminophen (NORCO/VICODIN) 5-325 MG per tablet 1 tablet  1 tablet Per Tube Q4H PRN Joselyn Glassman A, RPH   1 tablet at 07/04/20 6222  . labetalol (NORMODYNE) injection 10-40 mg  10-40 mg Intravenous Q10 min PRN Judith Part, MD   40 mg at 06/19/20  1945  . levETIRAcetam (KEPPRA) 100 MG/ML solution 1,000 mg  1,000 mg Per Tube BID Kipp Brood, MD   1,000 mg at 07/07/20 1204  . MEDLINE mouth rinse  15 mL Mouth Rinse 10 times per day Judith Part, MD   15 mL at 07/07/20 1206  . midodrine (PROAMATINE) tablet 10 mg  10 mg Per Tube Q8H Christian, Rylee, MD   10 mg at 07/07/20 0524  . modafinil (PROVIGIL) tablet 100 mg  100 mg Per Tube Daily Darrick Meigs, Rylee, MD   100 mg at 07/07/20 1205  . ondansetron (ZOFRAN) tablet 4 mg  4 mg Oral Q4H PRN Judith Part, MD       Or  . ondansetron (ZOFRAN) injection 4 mg  4 mg Intravenous Q4H PRN Judith Part, MD   4 mg at 07/02/20 0215  . pantoprazole sodium (PROTONIX) 40 mg/20 mL oral suspension  40 mg  40 mg Per Tube Daily Judith Part, MD   40 mg at 07/07/20 1203  . polyethylene glycol (MIRALAX / GLYCOLAX) packet 17 g  17 g Per Tube Daily PRN Levada Dy, Dwayne A, RPH      . valproate (DEPACON) 500 mg in dextrose 5 % 50 mL IVPB  500 mg Intravenous Q8H Agarwala, Einar Grad, MD 55 mL/hr at 07/07/20 0532 500 mg at 07/07/20 0532     Discharge Medications: Please see discharge summary for a list of discharge medications.  Relevant Imaging Results:  Relevant Lab Results:   Additional Information SSN [id number]  Joanne Chars, LCSW

## 2020-07-08 ENCOUNTER — Inpatient Hospital Stay (HOSPITAL_COMMUNITY): Payer: Medicaid Other

## 2020-07-08 DIAGNOSIS — Z930 Tracheostomy status: Secondary | ICD-10-CM

## 2020-07-08 LAB — CBC WITH DIFFERENTIAL/PLATELET
Abs Immature Granulocytes: 0.39 10*3/uL — ABNORMAL HIGH (ref 0.00–0.07)
Basophils Absolute: 0 10*3/uL (ref 0.0–0.1)
Basophils Relative: 0 %
Eosinophils Absolute: 0.3 10*3/uL (ref 0.0–0.5)
Eosinophils Relative: 3 %
HCT: 32.1 % — ABNORMAL LOW (ref 36.0–46.0)
Hemoglobin: 9.5 g/dL — ABNORMAL LOW (ref 12.0–15.0)
Immature Granulocytes: 4 %
Lymphocytes Relative: 18 %
Lymphs Abs: 1.9 10*3/uL (ref 0.7–4.0)
MCH: 27.1 pg (ref 26.0–34.0)
MCHC: 29.6 g/dL — ABNORMAL LOW (ref 30.0–36.0)
MCV: 91.5 fL (ref 80.0–100.0)
Monocytes Absolute: 1.2 10*3/uL — ABNORMAL HIGH (ref 0.1–1.0)
Monocytes Relative: 11 %
Neutro Abs: 6.8 10*3/uL (ref 1.7–7.7)
Neutrophils Relative %: 64 %
Platelets: 419 10*3/uL — ABNORMAL HIGH (ref 150–400)
RBC: 3.51 MIL/uL — ABNORMAL LOW (ref 3.87–5.11)
RDW: 16.3 % — ABNORMAL HIGH (ref 11.5–15.5)
WBC: 10.6 10*3/uL — ABNORMAL HIGH (ref 4.0–10.5)
nRBC: 2.1 % — ABNORMAL HIGH (ref 0.0–0.2)

## 2020-07-08 LAB — GLUCOSE, CAPILLARY
Glucose-Capillary: 102 mg/dL — ABNORMAL HIGH (ref 70–99)
Glucose-Capillary: 110 mg/dL — ABNORMAL HIGH (ref 70–99)
Glucose-Capillary: 105 mg/dL — ABNORMAL HIGH (ref 70–99)
Glucose-Capillary: 102 mg/dL — ABNORMAL HIGH (ref 70–99)
Glucose-Capillary: 81 mg/dL (ref 70–99)
Glucose-Capillary: 120 mg/dL — ABNORMAL HIGH (ref 70–99)

## 2020-07-08 MED ORDER — LORAZEPAM 2 MG/ML IJ SOLN
2.0000 mg | Freq: Once | INTRAMUSCULAR | Status: AC
  Administered 2020-07-08: 2 mg via INTRAVENOUS

## 2020-07-08 MED ORDER — LEVETIRACETAM 100 MG/ML PO SOLN
500.0000 mg | Freq: Once | ORAL | Status: AC
  Administered 2020-07-08: 500 mg
  Filled 2020-07-08: qty 5

## 2020-07-08 MED ORDER — LORAZEPAM 2 MG/ML IJ SOLN
2.0000 mg | INTRAMUSCULAR | Status: DC | PRN
  Administered 2020-07-09: 2 mg via INTRAVENOUS
  Filled 2020-07-08: qty 1

## 2020-07-08 MED ORDER — LORAZEPAM 2 MG/ML IJ SOLN
INTRAMUSCULAR | Status: AC
  Filled 2020-07-08: qty 1

## 2020-07-08 MED ORDER — FUROSEMIDE 10 MG/ML IJ SOLN
40.0000 mg | Freq: Once | INTRAMUSCULAR | Status: AC
  Administered 2020-07-08: 40 mg via INTRAVENOUS
  Filled 2020-07-08: qty 4

## 2020-07-08 MED ORDER — LEVETIRACETAM 100 MG/ML PO SOLN
1500.0000 mg | Freq: Two times a day (BID) | ORAL | Status: DC
  Administered 2020-07-08 – 2020-09-10 (×128): 1500 mg
  Filled 2020-07-08 (×128): qty 15

## 2020-07-08 NOTE — Progress Notes (Signed)
Neurosurgery Service Progress Note  Subjective: No acute events overnight   Objective: Vitals:   07/08/20 0600 07/08/20 0700 07/08/20 0742 07/08/20 0800  BP: 116/73 (!) 147/89 (!) 147/89 129/78  Pulse: 73 76 78 85  Resp: 16 19  20   Temp:    99.1 F (37.3 C)  TempSrc:    Axillary  SpO2: 100% 100% 100% 100%  Weight:      Height:        Physical Exam: Eyes closed, PERRL, gaze conjugate and neutral, w/d on the L, 0/5 on the R, incision c/d/i w/ staples  Assessment & Plan: 60 y.o. woman w/ ruptured MCA and vasospasm s/p hemicraniectomy, stable exam.   -SNF discharge in the works if she can wean to trach collar, otherwise will require LTACH -CCM recs, s/p course of unasyn for likely aspiration PNA  67  07/08/20 10:04 AM

## 2020-07-08 NOTE — Progress Notes (Signed)
NAME:  ELLIOTT QUADE, MRN:  [id number], DOB:  03-Oct-1960, LOS: 22 ADMISSION DATE:  06/15/2020, CONSULTATION DATE:  06/16/20 REFERRING MD:  Dr. Maurice Small, CHIEF COMPLAINT:  VDRF   Brief History:  60 year old female presented headache x 5 days found to have left sylvian fissure subarachnoid hemorrhage with a left MCA bifurcation aneurysm.  Transferred to [facility] for further care and went for diagnostic angiography with coiling, complicated by intraoperative rupture and subsequent severe vasospasm leading to malignant left MCA stroke.  She returned to the ICU intubated.  Past Medical History:   has a past medical history of Abdominal hernia.  Significant Hospital Events:  1/3 present to [facility] emergency department 1/4 transferred to [facility], NSGY admitting/ taken to neuro IR for coiling.  Remained on vent postop 1/5 more lethargic and unresponsive, stat head CT was done which showed progressive left MCA infarct with brain edema and 6 mm midline shift.  Persistent nonprogressive subarachnoid hemorrhage. 1/6 worsening edema on CT, 3% saline continued 1/7 Keppra increased for subclinical seizures on LTM EEG 1/8 Low-grade febrile 100.9 1/12 modafinil started  Consults:  Neuro IR PCCM  Procedures:  1/4 diagnostic angiogram with coiling 1/7 left hemicraniectomy 1/7 R IJ central line >> out 1/19 PEG tube 1/19 tracheostomy  Significant Diagnostic Tests:  1/3 >> CT angiogram head neck > small volume acute subarachnoid hemorrhage centered at the base of the left sylvian fissure.  6 x 6 x 9 mm irregular aneurysm arising from the left MCA bifurcation.  1/5 CTH >> Diffused and partially reabsorbed subarachnoid hemorrhage since yesterday. 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  1/6 CTH >> Progressive swelling of the left MCA infarct with new midline shift measuring 6 mm. Nonprogressive subarachnoid hemorrhage. No evidence of new territory infarct  1/6 CTH  >> increasing  midline shift 8 mm  1/6 >> LTM EEG evidence of epileptogenicity arising from right parietal region , no seizures  1/8 LTM EEG >> evidence ofcortical dysfunctionand epileptogenicity in left frontal region consistent with underlying craniotomy andstructural abnormality. After propofol was stopped, eeg showed periodic epileptiform discharges at 1-1.5hz  In left frontal region which were intermittent and rhythmic which is on the ictal-interictal continuum , worse compared to 1/7  1/9 LTM EEG similar to 1/8  1/10 echocardiogram>>EF 60-65%. No valvular abnormalities. Greater than 50% respiratory variability of IVC EEG 1/21 PL EDS left frontal region  Micro Data:  1/3 SARS/ flu >>neg 1/4 MRSA  >> neg 1/8 resp >> ng  Antimicrobials:  Unasyn 1/8>>1/12 Unasyn 1/20>>>  Interim History / Subjective:   Seizure-like activity this morning, Given Ativan Afebrile, critically ill, remains on vent  Objective   Blood pressure 99/84, pulse 86, temperature 99.1 F (37.3 C), temperature source Axillary, resp. rate (!) 23, height 5\' 4"  (1.626 m), weight 101 kg, SpO2 100 %.    Vent Mode: PSV;CPAP FiO2 (%):  [30 %] 30 % Set Rate:  [15 bmp] 15 bmp Vt Set:  [430 mL] 430 mL PEEP:  [5 cmH20] 5 cmH20 Pressure Support:  [12 cmH20-15 cmH20] 12 cmH20 Plateau Pressure:  [15 cmH20-18 cmH20] 15 cmH20   Intake/Output Summary (Last 24 hours) at 07/08/2020 1144 Last data filed at 07/08/2020 0500 Gross per 24 hour  Intake 1145 ml  Output 1200 ml  Net -55 ml   Filed Weights   07/04/20 0500 07/07/20 0500 07/08/20 0500  Weight: 104.1 kg 100.6 kg 101 kg   Physical Exam: Gen:      Elderly woman ,  no distress  HEENT:  EOMI, sclera anicteric, mild pallor, left craniotomy Neck:     Bilateral decreased breath sounds, no accessory muscle use CV:         Regular rate and rhythm; no murmurs Abd:      + bowel sounds; soft, non-tender; no palpable masses, no distension Ext:    No edema; adequate peripheral  perfusion Skin:      Warm and dry; no rash Neuro: Just received Ativan, right hemiplegia, does not follow commands  Chest x-ray 1/25 personally reviewed, bibasilar atelectasis, no effusions Labs from 1/26 shows mild increase in leukocytosis, no left shift  Resolved Hospital Problem list   Aspiration pneumonia--received 5d course of unasyn 1/8-1/12  Assessment & Plan:   Aneurysmal subarachnoid hemorrhage due to ruptured left MCA aneurysm s/p coiling.  Hunt & Hess score 1, Fisher grade 1. 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 and midline shift s/p hemi-craniectomy 1/7 EEG 1/21 PL EDS left frontal region  She continues to have intermittent seizure-like activity, Keppra was decreased a few days ago due to hypersomnolence  Plan: Increase Keppra 1500 twice daily Ativan as needed Continue midodrine    Status post tracheostomy Status post PEG tube  aspiration pneumonia Acute respiratory failure with hypoxia require mechanical ventilation  -Weaning on pressure support 12/5 , if no progress within the next 1 to 2 days then plan for LTAC vs vent SNF -Diurese again with Lasix Completed course of Unasyn   Best practice (evaluated daily)  Diet: NPO, tube feeds Pain/Anxiety/Delirium protocol (if indicated): prn fentanyl, delirium precautions VAP protocol (if indicated): Per protocol DVT prophylaxis: SCDs only  GI prophylaxis: PPI Glucose control: trend, with goal FS 140-180 Mobility: Bedrest Disposition: ICU Family: Daughter   Goals of Care:  Last date of multidisciplinary goals of care discussion: 1/16 Family and staff present: both daughters, [name] and [place], PCCM Summary of discussion: Continue full aggressive care Follow up goals of care discussion due: daughters wish to pursue full scope of care (1/16) including tracheostomy placement  Code Status: Full   Labs   CBC: Recent Labs  Lab 07/04/20 0721 07/05/20 0351 07/06/20 0935 07/07/20 0050  07/08/20 0259  WBC 14.5* 10.6* 7.4 7.7 10.6*  NEUTROABS 11.7* 7.6 4.6 4.6 6.8  HGB 8.5* 8.1* 9.0* 9.7* 9.5*  HCT 26.4* 25.3* 29.7* 30.8* 32.1*  MCV 88.9 90.0 90.5 90.3 91.5  PLT 355 360 433* 428* 419*    Basic Metabolic Panel: Recent Labs  Lab 07/03/20 1207 07/04/20 0721 07/05/20 0351 07/06/20 0935 07/07/20 0050  NA 137 142 144 143 141  K 3.8 3.8 3.6 3.8 3.6  CL 100 103 105 102 102  CO2 26 29 30 30 29   GLUCOSE 131* 97 95 98 111*  BUN 36* 33* 28* 22* 23*  CREATININE 0.93 0.82 0.75 0.65 0.67  CALCIUM 11.0* 10.9* 10.9* 10.8* 10.5*  MG  --   --   --   --  1.9  PHOS  --   --   --   --  3.2   GFR: Estimated Creatinine Clearance: 86.4 mL/min (by C-G formula based on SCr of 0.67 mg/dL). Recent Labs  Lab 07/05/20 0351 07/06/20 0935 07/07/20 0050 07/08/20 0259  WBC 10.6* 7.4 7.7 10.6*    Liver Function Tests: No results for input(s): AST, ALT, ALKPHOS, BILITOT, PROT, ALBUMIN in the last 168 hours. No results for input(s): LIPASE, AMYLASE in the last 168 hours. No results for input(s): AMMONIA in the last 168 hours.  ABG    Component Value Date/Time   PHART 7.560 (H) 06/19/2020 1513   PCO2ART 31.6 (L) 06/19/2020 1513   PO2ART 138 (H) 06/19/2020 1513   HCO3 28.5 (H) 06/19/2020 1513   TCO2 29 06/19/2020 1513   ACIDBASEDEF 1.0 06/17/2020 0939   O2SAT 99.0 06/19/2020 1513      CBG: Recent Labs  Lab 07/07/20 1537 07/07/20 1924 07/07/20 2306 07/08/20 0315 07/08/20 0730  GLUCAP 117* 105* 82 102* 110*    Cyril Mourning MD. FCCP. [place] See Amion for pager  If no response to pager , please call [phone]  After 7:00 pm call Elink  [phone]   07/08/2020     07/08/2020, 11:44 AM

## 2020-07-08 NOTE — Progress Notes (Signed)
LTM Hooked up and running; no initial skin break down. [facility] notified and event button tested.

## 2020-07-09 DIAGNOSIS — R569 Unspecified convulsions: Secondary | ICD-10-CM

## 2020-07-09 LAB — CBC WITH DIFFERENTIAL/PLATELET
Abs Immature Granulocytes: 0.34 10*3/uL — ABNORMAL HIGH (ref 0.00–0.07)
Basophils Absolute: 0 10*3/uL (ref 0.0–0.1)
Basophils Relative: 1 %
Eosinophils Absolute: 0.2 10*3/uL (ref 0.0–0.5)
Eosinophils Relative: 3 %
HCT: 33 % — ABNORMAL LOW (ref 36.0–46.0)
Hemoglobin: 9.8 g/dL — ABNORMAL LOW (ref 12.0–15.0)
Immature Granulocytes: 4 %
Lymphocytes Relative: 16 %
Lymphs Abs: 1.2 10*3/uL (ref 0.7–4.0)
MCH: 27.5 pg (ref 26.0–34.0)
MCHC: 29.7 g/dL — ABNORMAL LOW (ref 30.0–36.0)
MCV: 92.4 fL (ref 80.0–100.0)
Monocytes Absolute: 1 10*3/uL (ref 0.1–1.0)
Monocytes Relative: 13 %
Neutro Abs: 5 10*3/uL (ref 1.7–7.7)
Neutrophils Relative %: 63 %
Platelets: 377 10*3/uL (ref 150–400)
RBC: 3.57 MIL/uL — ABNORMAL LOW (ref 3.87–5.11)
RDW: 16.5 % — ABNORMAL HIGH (ref 11.5–15.5)
WBC: 7.9 10*3/uL (ref 4.0–10.5)
nRBC: 1 % — ABNORMAL HIGH (ref 0.0–0.2)

## 2020-07-09 LAB — BASIC METABOLIC PANEL
Anion gap: 9 (ref 5–15)
BUN: 32 mg/dL — ABNORMAL HIGH (ref 6–20)
CO2: 32 mmol/L (ref 22–32)
Calcium: 10.8 mg/dL — ABNORMAL HIGH (ref 8.9–10.3)
Chloride: 100 mmol/L (ref 98–111)
Creatinine, Ser: 0.84 mg/dL (ref 0.44–1.00)
GFR, Estimated: >60 mL/min (ref >60)
Glucose, Bld: 89 mg/dL (ref 70–99)
Potassium: 4 mmol/L (ref 3.5–5.1)
Sodium: 141 mmol/L (ref 135–145)

## 2020-07-09 LAB — GLUCOSE, CAPILLARY
Glucose-Capillary: 83 mg/dL (ref 70–99)
Glucose-Capillary: 102 mg/dL — ABNORMAL HIGH (ref 70–99)
Glucose-Capillary: 86 mg/dL (ref 70–99)
Glucose-Capillary: 91 mg/dL (ref 70–99)
Glucose-Capillary: 106 mg/dL — ABNORMAL HIGH (ref 70–99)
Glucose-Capillary: 98 mg/dL (ref 70–99)

## 2020-07-09 LAB — VALPROIC ACID LEVEL: Valproic Acid Lvl: 30 ug/mL — ABNORMAL LOW (ref 50.0–100.0)

## 2020-07-09 MED ORDER — LORAZEPAM 2 MG/ML IJ SOLN
2.0000 mg | INTRAMUSCULAR | Status: DC | PRN
  Administered 2020-07-11 – 2020-08-11 (×4): 2 mg via INTRAVENOUS
  Filled 2020-07-09 (×5): qty 1

## 2020-07-09 NOTE — Consult Note (Signed)
Neurology Consultation Reason for Consult: seizure Referring Physician: Dr. Cyril Mourning  CC: Seizure  History is obtained from: Chart review as patient is altered  HPI: Denise Savage is a 60 y.o. female who initially presented on 06/15/2020 for headache.  CT head showed small volume acute subarachnoid hemorrhage at the base of the left sylvian fissure. CTA head and neck showed 6 x 6 x 9 mm irregular aneurysm arising from left MCA bifurcation.  Neurosurgery was consulted and patient had coiling done on 06/16/2020 complicated by intraoperative aneurysmal rupture and bleeding.  Was noted to have seizures and therefore started on Keppra followed by Depakote.  However seizures persisted.  Therefore neurology was consulted.  ROS: All other systems reviewed and negative except as noted in the HPI.  Past Medical History:  Diagnosis Date  . Abdominal hernia     History reviewed. No pertinent family history.  Social History:  reports that she has never smoked. She has never used smokeless tobacco. She reports that she does not drink alcohol and does not use drugs.  Exam: Current vital signs: BP (!) 129/96   Pulse 88   Temp 99 F (37.2 C) (Axillary)   Resp 14   Ht 5\' 4"  (1.626 m)   Wt 99.6 kg   SpO2 100%   BMI 37.69 kg/m  Vital signs in last 24 hours: Temp:  [98.9 F (37.2 C)-99.3 F (37.4 C)] 99 F (37.2 C) (01/27 1200) Pulse Rate:  [69-96] 88 (01/27 1200) Resp:  [14-30] 14 (01/27 1200) BP: (99-130)/(66-96) 129/96 (01/27 1200) SpO2:  [100 %] 100 % (01/27 1200) FiO2 (%):  [30 %] 30 % (01/27 1130) Weight:  [99.6 kg] 99.6 kg (01/27 0500)   Physical Exam  Constitutional: Appears well-developed and well-nourished.  Psych: Unable to assess due to altered mental status Eyes: No scleral injection HENT: No OP obstrucion Head: Normocephalic.  Cardiovascular: Normal rate and regular rhythm.  Respiratory: Effort normal, non-labored breathing GI: Soft.  No distension. There is no  tenderness.  Skin: Warm Neuro: Lethargic, opens eyes to repeated tactile stimulation, did not follow commands for me but per daughter to follow commands for nursing earlier, PERRLA, extraocular movements intact, no forced gaze deviation, antigravity strength in left upper and left lower extremity, withdraws to noxious stimuli in right lower extremity, no movement in right upper extremity.  I have reviewed labs in epic and the results pertinent to this consultation are:   I have reviewed the images obtained: CT head wo contrast 06/19/2020: New postoperative changes of left hemispheric craniectomy for decompression with placement of extra-axial drain. Substantial improvement in mass effect with trace residual midline Shift. Evolving large left MCA territory infarction and residual subarachnoid hemorrhage.  CTA head 07/03/2020: Post coiling of left MCA bifurcation aneurysm. No residual aneurysm. Left MCA branches are patent.  No other aneurysm or significant intracranial stenosis. Large territory left MCA infarct with progressive hyperdensity in the cortex compatible with mineralization and cortical necrosis. No acute hemorrhage no midline shift  Progressive subgaleal fluid collection 19 mm thickness.  ASSESSMENT/PLAN: 60 year old female with left MCA aneurysmal rupture status post coiling now noted to have seizures.   Left MCA aneurysm status post coiling Subarachnoid hemorrhage Seizures Acute encephalopathy -Seizures most likely secondary to underlying cortical injuries. -On EEG, periodic epileptiform discharges are only seen in left frontal region however clinically patient has LEFT upper extremity rhythmic twitching.  It is possible that due to motor weakness, seizures are not clinically visible in right upper extremity versus  spread of seizures from left to right motor cortex but the area of involvement is too small to be visible on scalp EEG.  Recommendations: -Keppra was increased to  1500 mg twice daily yesterday.  Therefore we will continue the same dose.  Patient's daughter is already concerned about patient's increased lethargy since increasing Keppra level. -Continue Depakote 500 mg every 8 hours.  We will check Depakote levels -If Depakote levels are therapeutic and seizures recur, will consider adding Vimpat.  We will also check EKG to look for PR interval in case we have to add Vimpat -Continue LTM EEG while we are adjusting seizure medications -Continue seizure precautions - As needed IV Ativan for clinical seizure-like activity last more than 5 minutes -Management of rest of comorbidities per primary team   CRITICAL CARE Performed by: Charlsie Quest   Total critical care time:40 minutes  Critical care time was exclusive of separately billable procedures and treating other patients.  Critical care was necessary to treat or prevent imminent or life-threatening deterioration.  Critical care was time spent personally by me on the following activities: development of treatment plan with patient and/or surrogate as well as nursing, discussions with consultants, evaluation of patient's response to treatment, examination of patient, obtaining history from patient or surrogate, ordering and performing treatments and interventions, ordering and review of laboratory studies, ordering and review of radiographic studies, pulse oximetry and re-evaluation of patient's condition.    Lindie Spruce Epilepsy [facility]

## 2020-07-09 NOTE — Progress Notes (Signed)
Patient's left arm and hand were twitching for about 2 min. Button pushed on EEG and tech called to confirm. She will reach out to Dr. Melynda Ripple to let her know pt had another event. PRN Ativan was not given since it had been less than five min occurance. Arryn Terrones, [place] Scrape, RN

## 2020-07-09 NOTE — Progress Notes (Addendum)
After bathing patient at 0038 noted mild L arm jerking /stiffness and dysconjugate eyes. Event button pressed on EEG. This continued for approx 5 mins until 2mg  ativan administered. Seizure-like activity ceased upon administration of ativan.

## 2020-07-09 NOTE — Procedures (Signed)
Name:Denise Savage ZOX:[id number] Epilepsy Attending:Priyanka [name] Referring Physician/Provider:Dr.  Ervin Knack Duration:07/08/2020 1711 to 07/09/2020 1711  Patient history:60 year old female with ruptured left MCA aneurysm status post coiling,continues to be encephalopathic.EEG to evaluate for seizures.  Level of alertness:awake, asleep  AEDs during EEG study:Keppra, VPA  Technical aspects: This EEG study was done with scalp electrodes positioned according to the 10-20 International system of electrode placement. Electrical activity was acquired at a sampling rate of 500Hz  and reviewed with a high frequency filter of 70Hz  and a low frequency filter of 1Hz . EEG data were recorded continuously and digitally stored.   Description: No clear posterior dominant rhythm was seen. Sleep was characterized by sleep spindles (12 to 14 Hz), maximal frontocentral region. EEG showed continuous generalizedand maximal  left frontal region 3-5Hz  theta-delta slowing. Periodic epileptiform dischargeswere seen in left frontal region at 0.5-1Hz  which were intermittent.   Event button was pressed on 07/08/2020 at 2129 during which patient was noted to have subtle left hand rhythmic movements.  Concomitant EEG showed periodic epileptiform discharges in left frontal region at 1 Hz which appeared rhythmic.   Event button was pressed on 07/09/2020 at 0037 during which patient was noted to have left hand rhythmic movements.  Concomitant EEG showed periodic epileptiform discharges in left frontal region at 1 Hz which appeared rhythmic.   IMPRESSION: This study showedtwo focal motor seizures ( on 07/08/2020 at 2129 and on 07/09/2020 at 0037) during which patient was noted to have left hand rhythmic movements.  Concomitant EEG showed periodic epileptiform discharges in left frontal region at 1 Hz which appeared rhythmic.  There is also evidence ofcortical dysfunctionandepileptogenicity in  left frontal region consistent with underlying craniotomy andstructural abnormality.Additionally there is moderate diffuse encephalopathy, nonspecific etiology.  Priyanka 07/10/2020

## 2020-07-09 NOTE — TOC Progression Note (Signed)
Transition of Care [facility]) - Progression Note    Patient Details  Name: Denise Savage MRN: [id number] Date of Birth: 01/23/1961  Transition of Care [facility]) CM/SW Contact  Lorri Frederick, LCSW Phone Number: 07/09/2020, 2:57 PM  Clinical Narrative:   CSW spoke with Alcario Drought at [facility].  They cannot accept a medicaid pt even with LOG.  CSW spoke with Clydie Braun at [facility].  [facility] can accept an LOG/pending medicaid pt and will review the referral.    Expected Discharge Plan: Skilled Nursing Facility Barriers to Discharge: Continued Medical Work up,SNF Pending bed offer  Expected Discharge Plan and Services Expected Discharge Plan: Skilled Nursing Facility     Post Acute Care Choice: Skilled Nursing Facility Living arrangements for the past 2 months: Single Family Home                                       Social Determinants of Health (SDOH) Interventions    Readmission Risk Interventions No flowsheet data found.

## 2020-07-09 NOTE — Progress Notes (Signed)
Neurosurgery Service Progress Note  Subjective: No acute events overnight, but did have some L sided clonic movements  Objective: Vitals:   07/09/20 0600 07/09/20 0700 07/09/20 0734 07/09/20 0800  BP: 127/75 126/73  125/72  Pulse: 88 74 80 69  Resp: 16 15 (!) 23 20  Temp:    99.3 F (37.4 C)  TempSrc:    Axillary  SpO2: 100% 100% 100% 100%  Weight:      Height:        Physical Exam: Eyes closed, PERRL, gaze conjugate and intermittently deviated L vs R vs neutral (not roving gaze, appears forced), not FC, w/d on the L, 0/5 on the R, incision c/d/i w/ staples  Assessment & Plan: 60 y.o. woman w/ ruptured MCA and vasospasm s/p hemicraniectomy, stable exam.   -f/u EEG read -can d/c staples after EEG d/c'd -likely will require LTACH placement after seizures are controlled -CCM recs  Jadene Pierini  07/09/20 8:49 AM

## 2020-07-09 NOTE — Progress Notes (Signed)
NAME:  Denise Savage, MRN:  [id number], DOB:  June 01, 1961, LOS: 23 ADMISSION DATE:  06/15/2020, CONSULTATION DATE:  06/16/20 REFERRING MD:  Dr. Maurice Small, CHIEF COMPLAINT:  VDRF   Brief History:  60 year old female presented headache x 5 days found to have left sylvian fissure subarachnoid hemorrhage with a left MCA bifurcation aneurysm.  Transferred to [facility] for further care and went for diagnostic angiography with coiling, complicated by intraoperative rupture and subsequent severe vasospasm leading to malignant left MCA stroke.  She returned to the ICU intubated.  Past Medical History:   has a past medical history of Abdominal hernia.  Significant Hospital Events:  1/3 present to [facility] emergency department 1/4 transferred to [facility], NSGY admitting/ taken to neuro IR for coiling.  Remained on vent postop 1/5 more lethargic and unresponsive, stat head CT was done which showed progressive left MCA infarct with brain edema and 6 mm midline shift.  Persistent nonprogressive subarachnoid hemorrhage. 1/6 worsening edema on CT, 3% saline continued 1/7 Keppra increased for subclinical seizures on LTM EEG 1/8 Low-grade febrile 100.9 1/12 modafinil started 1/26 Seizure-like activity >>Given Ativan, LTM EEG hooked up  Consults:  Neuro IR PCCM  Procedures:  1/4 diagnostic angiogram with coiling 1/7 left hemicraniectomy 1/7 R IJ central line >> out 1/19 PEG tube 1/19 tracheostomy  Significant Diagnostic Tests:  1/3 >> CT angiogram head neck > small volume acute subarachnoid hemorrhage centered at the base of the left sylvian fissure.  6 x 6 x 9 mm irregular aneurysm arising from the left MCA bifurcation.  1/5 CTH >> Diffused and partially reabsorbed subarachnoid hemorrhage since yesterday. 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  1/6 CTH >> Progressive swelling of the left MCA infarct with new midline shift measuring 6 mm. Nonprogressive subarachnoid hemorrhage. No  evidence of new territory infarct  1/6 CTH  >> increasing midline shift 8 mm  1/6 >> LTM EEG evidence of epileptogenicity arising from right parietal region , no seizures  1/8 LTM EEG >> evidence ofcortical dysfunctionand epileptogenicity in left frontal region consistent with underlying craniotomy andstructural abnormality. After propofol was stopped, eeg showed periodic epileptiform discharges at 1-1.5hz  In left frontal region which were intermittent and rhythmic which is on the ictal-interictal continuum , worse compared to 1/7  1/9 LTM EEG similar to 1/8  1/10 echocardiogram>>EF 60-65%. No valvular abnormalities. Greater than 50% respiratory variability of IVC EEG 1/21 PL EDS left frontal region  Micro Data:  1/3 SARS/ flu >>neg 1/4 MRSA  >> neg 1/8 resp >> ng  Antimicrobials:  Unasyn 1/8>>1/12 Unasyn 1/20>>>1/25  Interim History / Subjective:   LTM EEG started yesterday after seizure episodes. Another event noted around midnight and Ativan given. Remains unresponsive, afebrile, critically ill, on vent  Objective   Blood pressure 125/72, pulse 69, temperature 99.3 F (37.4 C), temperature source Axillary, resp. rate 20, height 5\' 4"  (1.626 m), weight 99.6 kg, SpO2 100 %.    Vent Mode: PSV;CPAP FiO2 (%):  [30 %] 30 % Set Rate:  [15 bmp] 15 bmp Vt Set:  [430 mL] 430 mL PEEP:  [5 cmH20] 5 cmH20 Pressure Support:  [10 cmH20-12 cmH20] 12 cmH20 Plateau Pressure:  [15 cmH20-20 cmH20] 17 cmH20   Intake/Output Summary (Last 24 hours) at 07/09/2020 0856 Last data filed at 07/09/2020 0800 Gross per 24 hour  Intake 1470 ml  Output 650 ml  Net 820 ml   Filed Weights   07/07/20 0500 07/08/20 0500 07/09/20 0500  Weight: 100.6  kg 101 kg 99.6 kg   Physical Exam: Gen:      Elderly woman , no distress  HEENT:  EOMI, sclera anicteric, mild pallor, left craniotomy Neck:     No accessory muscle use, bilateral decreased breath sounds CV:         Regular rate and rhythm; no  murmurs Abd:      + bowel sounds; soft, non-tender; no palpable masses, no distension Ext:    No edema; adequate peripheral perfusion Skin:      Warm and dry; no rash Neuro: Does not follow commands, right hemiplegia  Chest x-ray 1/25 personally reviewed, bibasilar atelectasis, no effusions Labs from 1/27 normal electrolytes, no leukocytosis  Resolved Hospital Problem list   Aspiration pneumonia--received 5d course of unasyn 1/8-1/12, again 1/20-1/25  Assessment & Plan:   Aneurysmal subarachnoid hemorrhage due to ruptured left MCA aneurysm s/p coiling.  Hunt & Hess score 1, Fisher grade 1. 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 Defer to epileptologist if third anticonvulsant needs to be added based on LTM EEG Ativan as needed Continue midodrine   Status post tracheostomy Status post PEG tube  Acute respiratory failure with hypoxia require mechanical ventilation  -Weans on pressure support 12/5 but further progress seems to have been stalled, plan for LTAC vs vent SNF once seizures adequately controlled     Best practice (evaluated daily)  Diet: NPO, tube feeds Pain/Anxiety/Delirium protocol (if indicated): prn fentanyl, delirium precautions VAP protocol (if indicated): Per protocol DVT prophylaxis: SCDs only  GI prophylaxis: PPI Glucose control: trend, with goal FS 140-180 Mobility: Bedrest Disposition: ICU Family: per NS  Goals of Care:  Last date of multidisciplinary goals of care discussion: 1/16 Family and staff present: both daughters, [name] and Powell, PCCM Summary of discussion: Continue full aggressive care Follow up goals of care discussion due: daughters wish to pursue full scope of  care   Code Status: Full   Labs   CBC: Recent Labs  Lab 07/05/20 0351 07/06/20 0935 07/07/20 0050 07/08/20 0259 07/09/20 0412  WBC 10.6* 7.4 7.7 10.6* 7.9  NEUTROABS 7.6 4.6 4.6 6.8 5.0  HGB 8.1* 9.0* 9.7* 9.5* 9.8*  HCT 25.3* 29.7* 30.8* 32.1* 33.0*  MCV 90.0 90.5 90.3 91.5 92.4  PLT 360 433* 428* 419* 377    Basic Metabolic Panel: Recent Labs  Lab 07/04/20 0721 07/05/20 0351 07/06/20 0935 07/07/20 0050 07/09/20 0412  NA 142 144 143 141 141  K 3.8 3.6 3.8 3.6 4.0  CL 103 105 102 102 100  CO2 29 30 30 29  32  GLUCOSE 97 95 98 111* 89  BUN 33* 28* 22* 23* 32*  CREATININE 0.82 0.75 0.65 0.67 0.84  CALCIUM 10.9* 10.9* 10.8* 10.5* 10.8*  MG  --   --   --  1.9  --   PHOS  --   --   --  3.2  --    GFR: Estimated Creatinine Clearance: 81.7 mL/min (by C-G formula based on SCr of 0.84 mg/dL). Recent Labs  Lab 07/06/20 0935 07/07/20 0050 07/08/20 0259 07/09/20 0412  WBC 7.4 7.7 10.6* 7.9    Liver Function Tests: No results for input(s): AST, ALT, ALKPHOS, BILITOT, PROT, ALBUMIN in the last 168 hours. No results for input(s): LIPASE,  AMYLASE in the last 168 hours. No results for input(s): AMMONIA in the last 168 hours.  ABG    Component Value Date/Time   PHART 7.560 (H) 06/19/2020 1513   PCO2ART 31.6 (L) 06/19/2020 1513   PO2ART 138 (H) 06/19/2020 1513   HCO3 28.5 (H) 06/19/2020 1513   TCO2 29 06/19/2020 1513   ACIDBASEDEF 1.0 06/17/2020 0939   O2SAT 99.0 06/19/2020 1513      CBG: Recent Labs  Lab 07/08/20 1541 07/08/20 1926 07/08/20 2321 07/09/20 0318 07/09/20 0728  GLUCAP 102* 81 120* 83 102*    Patient critically ill due to respiratory failure, seizures Interventions to address this today vent weaning, titration of anticonvulsants, LTM EEG Risk of deterioration without these interventions is high  I personally spent 31 minutes providing critical care not including any separately billable procedures    Cyril Mourning MD. FCCP. [place]  Pulmonary [facility] Critical care See Amion for pager  If no response to pager , please call [phone]  After 7:00 pm call Elink  [phone]    07/09/2020, 8:56 AM

## 2020-07-09 NOTE — Progress Notes (Signed)
EEG maint complete. No skin breakdown at Fp1 Fp2 Fz

## 2020-07-10 LAB — BASIC METABOLIC PANEL
Anion gap: 10 (ref 5–15)
BUN: 27 mg/dL — ABNORMAL HIGH (ref 6–20)
CO2: 31 mmol/L (ref 22–32)
Calcium: 10.8 mg/dL — ABNORMAL HIGH (ref 8.9–10.3)
Chloride: 102 mmol/L (ref 98–111)
Creatinine, Ser: 0.81 mg/dL (ref 0.44–1.00)
GFR, Estimated: >60 mL/min (ref >60)
Glucose, Bld: 121 mg/dL — ABNORMAL HIGH (ref 70–99)
Potassium: 4.2 mmol/L (ref 3.5–5.1)
Sodium: 143 mmol/L (ref 135–145)

## 2020-07-10 LAB — GLUCOSE, CAPILLARY
Glucose-Capillary: 72 mg/dL (ref 70–99)
Glucose-Capillary: 96 mg/dL (ref 70–99)
Glucose-Capillary: 100 mg/dL — ABNORMAL HIGH (ref 70–99)
Glucose-Capillary: 101 mg/dL — ABNORMAL HIGH (ref 70–99)
Glucose-Capillary: 100 mg/dL — ABNORMAL HIGH (ref 70–99)
Glucose-Capillary: 90 mg/dL (ref 70–99)

## 2020-07-10 LAB — CBC WITH DIFFERENTIAL/PLATELET
Abs Immature Granulocytes: 0.18 10*3/uL — ABNORMAL HIGH (ref 0.00–0.07)
Basophils Absolute: 0.1 10*3/uL (ref 0.0–0.1)
Basophils Relative: 1 %
Eosinophils Absolute: 0.2 10*3/uL (ref 0.0–0.5)
Eosinophils Relative: 3 %
HCT: 31.4 % — ABNORMAL LOW (ref 36.0–46.0)
Hemoglobin: 9.8 g/dL — ABNORMAL LOW (ref 12.0–15.0)
Immature Granulocytes: 3 %
Lymphocytes Relative: 18 %
Lymphs Abs: 1.2 10*3/uL (ref 0.7–4.0)
MCH: 28.4 pg (ref 26.0–34.0)
MCHC: 31.2 g/dL (ref 30.0–36.0)
MCV: 91 fL (ref 80.0–100.0)
Monocytes Absolute: 0.9 10*3/uL (ref 0.1–1.0)
Monocytes Relative: 14 %
Neutro Abs: 4.2 10*3/uL (ref 1.7–7.7)
Neutrophils Relative %: 61 %
Platelets: 356 10*3/uL (ref 150–400)
RBC: 3.45 MIL/uL — ABNORMAL LOW (ref 3.87–5.11)
RDW: 16.9 % — ABNORMAL HIGH (ref 11.5–15.5)
WBC: 6.8 10*3/uL (ref 4.0–10.5)
nRBC: 0.4 % — ABNORMAL HIGH (ref 0.0–0.2)

## 2020-07-10 MED ORDER — VALPROATE SODIUM 100 MG/ML IV SOLN
750.0000 mg | Freq: Three times a day (TID) | INTRAVENOUS | Status: DC
  Administered 2020-07-10 – 2020-07-21 (×33): 750 mg via INTRAVENOUS
  Filled 2020-07-10 (×38): qty 7.5

## 2020-07-10 NOTE — Procedures (Addendum)
Name:Denise Savage CHE:[id number] Epilepsy Attending:Gionni Vaca [name] Referring Physician/Provider:Dr. Ervin Knack Duration:07/09/2020 1711 to 07/10/2020 1711  Patient history:60 year old female with ruptured left MCA aneurysm status post coiling,continues to be encephalopathic.EEG to evaluate for seizures.  Level of alertness:awake, asleep  AEDs during EEG study:Keppra, VPA  Technical aspects: This EEG study was done with scalp electrodes positioned according to the 10-20 International system of electrode placement. Electrical activity was acquired at a sampling rate of 500Hz  and reviewed with a high frequency filter of 70Hz  and a low frequency filter of 1Hz . EEG data were recorded continuously and digitally stored.   Description: No clear posterior dominant rhythm was seen. Sleep was characterized by sleep spindles (12 to 14 Hz), maximal frontocentral region. EEG showed continuous generalizedand maximalleft frontal region 3-5Hz  theta-delta slowing.Periodic epileptiform dischargeswere seen in left frontal region at0.5-1Hz which were intermittent.   Event button was pressed on 07/09/2020 at 1834 during which patient was noted to have left hand rhythmic movements.  Concomitant EEG showed periodic epileptiform discharges in left frontal region at 1 Hz which appeared rhythmic.   Event button was pressed on 07/10/2020 at 0148  unclear reasons.  Concomitant EEG before, during or after the event did not show EEG change.  IMPRESSION: This study showedone focal motor seizures on 07/09/2020 at 1834 during which patient was noted to have left hand rhythmic movements.  Concomitant EEG showed periodic epileptiform discharges in left frontal region at 1 Hz which appeared rhythmic.  There is also evidence ofcortical dysfunctionandepileptogenicity in left frontal region consistent with underlying craniotomy andstructural abnormality.Additionally there is moderate diffuse  encephalopathy, nonspecific etiology.  Anette Barra 07/11/2020

## 2020-07-10 NOTE — Progress Notes (Signed)
NAME:  Denise Savage, MRN:  [id number], DOB:  Mar 09, 1961, LOS: 24 ADMISSION DATE:  06/15/2020, CONSULTATION DATE:  06/16/20 REFERRING MD:  Dr. Maurice Small, CHIEF COMPLAINT:  VDRF   Brief History:  60 year old female presented headache x 5 days found to have left sylvian fissure subarachnoid hemorrhage with a left MCA bifurcation aneurysm.  Transferred to [facility] for further care and went for diagnostic angiography with coiling, complicated by intraoperative rupture and subsequent severe vasospasm leading to malignant left MCA stroke.  She returned to the ICU intubated.  Past Medical History:   has a past medical history of Abdominal hernia.  Significant Hospital Events:  1/3 present to [facility] emergency department 1/4 transferred to [facility], NSGY admitting/ taken to neuro IR for coiling.  Remained on vent postop 1/5 more lethargic and unresponsive, stat head CT was done which showed progressive left MCA infarct with brain edema and 6 mm midline shift.  Persistent nonprogressive subarachnoid hemorrhage. 1/6 worsening edema on CT, 3% saline continued 1/7 Keppra increased for subclinical seizures on LTM EEG 1/8 Low-grade febrile 100.9 1/12 modafinil started 1/26 Seizure-like activity >>Given Ativan, LTM EEG hooked up, keprra increased 1/27 seizure activity noted on EEG  Consults:  Neuro IR PCCM  Procedures:  1/4 diagnostic angiogram with coiling 1/7 left hemicraniectomy 1/7 R IJ central line >> out 1/19 PEG tube 1/19 tracheostomy  Significant Diagnostic Tests:  1/3 >> CT angiogram head neck > small volume acute subarachnoid hemorrhage centered at the base of the left sylvian fissure.  6 x 6 x 9 mm irregular aneurysm arising from the left MCA bifurcation.  1/5 CTH >> Diffused and partially reabsorbed subarachnoid hemorrhage since yesterday. 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  1/6 CTH >> Progressive swelling of the left MCA infarct with new midline shift  measuring 6 mm. Nonprogressive subarachnoid hemorrhage. No evidence of new territory infarct  1/6 CTH  >> increasing midline shift 8 mm  1/6 >> LTM EEG evidence of epileptogenicity arising from right parietal region , no seizures  1/8 LTM EEG >> evidence ofcortical dysfunctionand epileptogenicity in left frontal region consistent with underlying craniotomy andstructural abnormality. After propofol was stopped, eeg showed periodic epileptiform discharges at 1-1.5hz  In left frontal region which were intermittent and rhythmic which is on the ictal-interictal continuum , worse compared to 1/7  1/9 LTM EEG similar to 1/8  1/10 echocardiogram>>EF 60-65%. No valvular abnormalities. Greater than 50% respiratory variability of IVC EEG 1/21 PL EDS left frontal region  Micro Data:  1/3 SARS/ flu >>neg 1/4 MRSA  >> neg 1/8 resp >> ng  Antimicrobials:  Unasyn 1/8>>1/12 Unasyn 1/20>>>1/25  Interim History / Subjective:  No acute events overnight.   Patient on PSV trial this morning 10/5   Objective   Blood pressure 132/80, pulse 87, temperature 99.4 F (37.4 C), temperature source Axillary, resp. rate 20, height 5\' 4"  (1.626 m), weight 99.6 kg, SpO2 100 %.    Vent Mode: PSV;CPAP FiO2 (%):  [30 %] 30 % Set Rate:  [15 bmp] 15 bmp Vt Set:  [430 mL] 430 mL PEEP:  [5 cmH20] 5 cmH20 Pressure Support:  [10 cmH20] 10 cmH20 Plateau Pressure:  [16 cmH20-20 cmH20] 16 cmH20   Intake/Output Summary (Last 24 hours) at 07/10/2020 0918 Last data filed at 07/10/2020 0800 Gross per 24 hour  Intake 1360.37 ml  Output 1325 ml  Net 35.37 ml   Filed Weights   07/08/20 0500 07/09/20 0500 07/10/20 0500  Weight: 101 kg 99.6  kg 99.6 kg   Physical Exam: Gen: no acute distress HEENT:  Leftward eye deviation, sclera anicteric, left craniotomy, moist mucous membranes Neck:  Trach in place. Clean, no secretions around trach CV: Regular rate and rhythm; no murmurs Resp: mildly course breath sounds, no  wheezing or rhonchi Abd: + bowel sounds; soft, no distension Ext: No edema, warm Skin:no rash Neuro: does not follow commands. No response to verbal stimuli.   Resolved Hospital Problem list   Aspiration pneumonia--received 5d course of unasyn 1/8-1/12, again 1/20-1/25  Assessment & Plan:   Aneurysmal subarachnoid hemorrhage due to ruptured left MCA aneurysm s/p coiling.  Hunt & Hess score 1, Fisher grade 1. 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 and midline shift s/p hemi-craniectomy 1/7 EEG 1/21 PL EDS left frontal region  She continues to have intermittent seizure-like activity on EEG on 1/26 and 1/27   Plan: Continue Keppra 1500 twice daily (increased on 1/26) Continue valproate. Neurology and Neurosurgery following Ativan as needed Continue midodrine. Will check with Neurosurgery if we are targeting a certain BP or if we can wean off this medication. Continue modafinil  Status post tracheostomy Status post PEG tube  Acute respiratory failure with hypoxia require mechanical ventilation  - Continue PSV trials on vent as tolerated. Wean as able. - plan for LTAC vs vent SNF once seizures adequately controlled  Best practice (evaluated daily)  Diet: tube feeds Pain/Anxiety/Delirium protocol (if indicated): prn fentanyl, delirium precautions VAP protocol (if indicated): Per protocol DVT prophylaxis: SCDs only  GI prophylaxis: PPI Glucose control: trend, with goal FS 140-180 Mobility: Bedrest Disposition: ICU Family: per NS  Goals of Care:  Last date of multidisciplinary goals of care discussion: 1/16 Family and staff present: both daughters, [name] and [place], PCCM Summary of discussion: Continue full aggressive care Follow up goals of care discussion due: daughters wish to pursue full scope of care   Code Status: Full   Labs   CBC: Recent Labs  Lab 07/06/20 0935 07/07/20 0050 07/08/20 0259 07/09/20 0412 07/10/20 0818  WBC 7.4 7.7 10.6* 7.9  6.8  NEUTROABS 4.6 4.6 6.8 5.0 4.2  HGB 9.0* 9.7* 9.5* 9.8* 9.8*  HCT 29.7* 30.8* 32.1* 33.0* 31.4*  MCV 90.5 90.3 91.5 92.4 91.0  PLT 433* 428* 419* 377 356    Basic Metabolic Panel: Recent Labs  Lab 07/04/20 0721 07/05/20 0351 07/06/20 0935 07/07/20 0050 07/09/20 0412  NA 142 144 143 141 141  K 3.8 3.6 3.8 3.6 4.0  CL 103 105 102 102 100  CO2 29 30 30 29  32  GLUCOSE 97 95 98 * 89  BUN 33* 28* 22* 23* 32*  CREATININE 0.82 0.75 0.65 0.67 0.84  CALCIUM 10.9* 10.9* 10.8* 10.5* 10.8*  MG  --   --   --  1.9  --   PHOS  --   --   --  3.2  --    GFR: Estimated Creatinine Clearance: 81.7 mL/min (by C-G formula based on SCr of 0.84 mg/dL). Recent Labs  Lab 07/07/20 0050 07/08/20 0259 07/09/20 0412 07/10/20 0818  WBC 7.7 10.6* 7.9 6.8    Liver Function Tests: No results for input(s): AST, ALT, ALKPHOS, BILITOT, PROT, ALBUMIN in the last 168 hours. No results for input(s): LIPASE, AMYLASE in the last 168 hours. No results for input(s): AMMONIA in the last 168 hours.  ABG    Component Value Date/Time   PHART 7.560 (H) 06/19/2020 1513   PCO2ART 31.6 (L) 06/19/2020 1513  PO2ART 138 (H) 06/19/2020 1513   HCO3 28.5 (H) 06/19/2020 1513   TCO2 29 06/19/2020 1513   ACIDBASEDEF 1.0 06/17/2020 0939   O2SAT 99.0 06/19/2020 1513      CBG: Recent Labs  Lab 07/09/20 1524 07/09/20 1922 07/09/20 2321 07/10/20 0314 07/10/20 0736  GLUCAP 91 106* 98 72 96    CC time: 40 minutes  Melody Comas, MD [facility] Office: [phone]  If no response to pager , please call [phone]  After 7:00 pm call Elink  [phone]    07/10/2020, 9:18 AM

## 2020-07-10 NOTE — Plan of Care (Signed)
  Problem: Education: Goal: Knowledge of disease or condition will improve Outcome: Not Progressing   

## 2020-07-10 NOTE — Progress Notes (Addendum)
Subjective: Had another 2 to 3-minute focal seizure last night.  No family at bedside today.  ROS: Unable to obtain due to poor mental status  Examination  Vital signs in last 24 hours: Temp:  [98.9 F (37.2 C)-99.7 F (37.6 C)] 99.4 F (37.4 C) (01/28 0800) Pulse Rate:  [80-96] 94 (01/28 1131) Resp:  [14-31] 23 (01/28 1131) BP: (107-148)/(69-98) 110/69 (01/28 1100) SpO2:  [100 %] 100 % (01/28 1131) FiO2 (%):  [30 %] 30 % (01/28 1131) Weight:  [99.6 kg] 99.6 kg (01/28 0500)  General: lying in bed, not in apparent distress CVS: pulse-normal rate and rhythm RS: breathing comfortably, + tracheostomy Extremities: normal  Neuro: Opens eyes to noxious stimuli but did not follow commands, PERRLA, oculocephalic reflex intact, antigravity strength in left upper and left lower extremity, withdraws to noxious stimuli in right lower extremity, no movement in right upper extremity.  Basic Metabolic Panel: Recent Labs  Lab 07/05/20 0351 07/06/20 0935 07/07/20 0050 07/09/20 0412 07/10/20 0818  NA 144 143 141 141 143  K 3.6 3.8 3.6 4.0 4.2  CL 105 102 102 100 102  CO2 30 30 29  32 31  GLUCOSE 95 98 111* 89 121*  BUN 28* 22* 23* 32* 27*  CREATININE 0.75 0.65 0.67 0.84 0.81  CALCIUM 10.9* 10.8* 10.5* 10.8* 10.8*  MG  --   --  1.9  --   --   PHOS  --   --  3.2  --   --     CBC: Recent Labs  Lab 07/06/20 0935 07/07/20 0050 07/08/20 0259 07/09/20 0412 07/10/20 0818  WBC 7.4 7.7 10.6* 7.9 6.8  NEUTROABS 4.6 4.6 6.8 5.0 4.2  HGB 9.0* 9.7* 9.5* 9.8* 9.8*  HCT 29.7* 30.8* 32.1* 33.0* 31.4*  MCV 90.5 90.3 91.5 92.4 91.0  PLT 433* 428* 419* 377 356     Coagulation Studies: No results for input(s): LABPROT, INR in the last 72 hours.  Imaging No new brain imaging overnight  ASSESSMENT/PLAN: 60 year old female with left MCA aneurysmal rupture status post coiling now noted to have seizures.   Left MCA aneurysm status post coiling Subarachnoid hemorrhage Seizures Acute  encephalopathy -Seizures most likely secondary to underlying cortical injuries. -On EEG, periodic epileptiform discharges are only seen in left frontal region however clinically patient has LEFT upper extremity rhythmic twitching.  It is possible that due to motor weakness, seizures are not clinically visible in right upper extremity versus spread of seizures from left to right motor cortex but the area of involvement is too small to be visible on scalp EEG.  Recommendations: -Valproic acid level subtherapeutic yesterday.  We will increase Depakote to 750 mg every 8 hours.  We will check Depakote level and ammonia on tomorrow morning. -Continue Keppra 1500 mg twice daily -If seizures persist, will consider adding Vimpat -Continue LTM EEG while we are adjusting seizure medications -Continue seizure precautions - As needed IV Ativan for clinical seizure-like activity last more than 5 minutes -Management of rest of comorbidities per primary team   CRITICAL CARE Performed by: 67   Total critical care time:35 minutes  Critical care time was exclusive of separately billable procedures and treating other patients.  Critical care was necessary to treat or prevent imminent or life-threatening deterioration.  Critical care was time spent personally by me on the following activities: development of treatment plan with patient and/or surrogate as well as nursing, discussions with consultants, evaluation of patient's response to treatment, examination of patient, obtaining  history from patient or surrogate, ordering and performing treatments and interventions, ordering and review of laboratory studies, ordering and review of radiographic studies, pulse oximetry and re-evaluation of patient's condition.   Lindie Spruce Epilepsy [facility] For questions after 5pm please refer to AMION to reach the Neurologist on call

## 2020-07-10 NOTE — Progress Notes (Signed)
LTM maint complete 

## 2020-07-10 NOTE — Progress Notes (Signed)
Neurosurgery Service Progress Note  Subjective: No acute events overnight, had 2-3 minute seizure overnight  Objective: Vitals:   07/10/20 1100 07/10/20 1131 07/10/20 1200 07/10/20 1300  BP: 110/69  112/67 127/72  Pulse: 81 94 78 88  Resp: (!) 22 (!) 23 (!) 21 (!) 24  Temp:      TempSrc:      SpO2: 100% 100% 100% 100%  Weight:      Height:        Physical Exam: Eyes open spontaneously, PERRL, gaze conjugate, not FC, w/d on the L, 0/5 on the R, incision c/d/i w/ staples  Assessment & Plan: 60 y.o. woman w/ ruptured MCA and vasospasm s/p hemicraniectomy, stable exam.   -f/u EEG read, neurology recs -goal BP normotensive -can d/c staples after EEG leads d/c'd -likely will require LTACH placement after seizures are controlled -CCM recs  Jadene Pierini  07/10/20 2:03 PM

## 2020-07-11 LAB — GLUCOSE, CAPILLARY
Glucose-Capillary: 71 mg/dL (ref 70–99)
Glucose-Capillary: 113 mg/dL — ABNORMAL HIGH (ref 70–99)
Glucose-Capillary: 84 mg/dL (ref 70–99)
Glucose-Capillary: 84 mg/dL (ref 70–99)
Glucose-Capillary: 81 mg/dL (ref 70–99)
Glucose-Capillary: 107 mg/dL — ABNORMAL HIGH (ref 70–99)

## 2020-07-11 LAB — CBC WITH DIFFERENTIAL/PLATELET
Abs Immature Granulocytes: 0.18 10*3/uL — ABNORMAL HIGH (ref 0.00–0.07)
Basophils Absolute: 0 10*3/uL (ref 0.0–0.1)
Basophils Relative: 1 %
Eosinophils Absolute: 0.2 10*3/uL (ref 0.0–0.5)
Eosinophils Relative: 3 %
HCT: 32.4 % — ABNORMAL LOW (ref 36.0–46.0)
Hemoglobin: 10 g/dL — ABNORMAL LOW (ref 12.0–15.0)
Immature Granulocytes: 3 %
Lymphocytes Relative: 24 %
Lymphs Abs: 1.6 10*3/uL (ref 0.7–4.0)
MCH: 28.1 pg (ref 26.0–34.0)
MCHC: 30.9 g/dL (ref 30.0–36.0)
MCV: 91 fL (ref 80.0–100.0)
Monocytes Absolute: 0.7 10*3/uL (ref 0.1–1.0)
Monocytes Relative: 10 %
Neutro Abs: 4.1 10*3/uL (ref 1.7–7.7)
Neutrophils Relative %: 59 %
Platelets: 461 10*3/uL — ABNORMAL HIGH (ref 150–400)
RBC: 3.56 MIL/uL — ABNORMAL LOW (ref 3.87–5.11)
RDW: 17 % — ABNORMAL HIGH (ref 11.5–15.5)
WBC: 6.9 10*3/uL (ref 4.0–10.5)
nRBC: 0.6 % — ABNORMAL HIGH (ref 0.0–0.2)

## 2020-07-11 LAB — ALBUMIN: Albumin: 2.1 g/dL — ABNORMAL LOW (ref 3.5–5.0)

## 2020-07-11 LAB — VALPROIC ACID LEVEL: Valproic Acid Lvl: 46 ug/mL — ABNORMAL LOW (ref 50.0–100.0)

## 2020-07-11 LAB — AMMONIA: Ammonia: 61 umol/L — ABNORMAL HIGH (ref 9–35)

## 2020-07-11 MED ORDER — MIDODRINE HCL 5 MG PO TABS
5.0000 mg | ORAL_TABLET | Freq: Three times a day (TID) | ORAL | Status: DC
  Administered 2020-07-11 – 2020-07-12 (×3): 5 mg
  Filled 2020-07-11 (×3): qty 1

## 2020-07-11 MED ORDER — SODIUM CHLORIDE 0.9 % IV SOLN
50.0000 mg | Freq: Two times a day (BID) | INTRAVENOUS | Status: DC
  Administered 2020-07-11 – 2020-08-17 (×74): 50 mg via INTRAVENOUS
  Filled 2020-07-11 (×87): qty 5

## 2020-07-11 NOTE — Progress Notes (Signed)
NAME:  Denise Savage, MRN:  [id number], DOB:  [birth year], LOS: 25 ADMISSION DATE:  06/15/2020, CONSULTATION DATE:  06/16/20 REFERRING MD:  Dr. Maurice Small, CHIEF COMPLAINT:  VDRF   Brief History:  60 year old female presented headache x 5 days found to have left sylvian fissure subarachnoid hemorrhage with a left MCA bifurcation aneurysm.  Transferred to [facility] for further care and went for diagnostic angiography with coiling, complicated by intraoperative rupture and subsequent severe vasospasm leading to malignant left MCA stroke.  She returned to the ICU intubated.  Past Medical History:   has a past medical history of Abdominal hernia.  Significant Hospital Events:  1/3 present to [facility] emergency department 1/4 transferred to [facility], NSGY admitting/ taken to neuro IR for coiling.  Remained on vent postop 1/5 more lethargic and unresponsive, stat head CT was done which showed progressive left MCA infarct with brain edema and 6 mm midline shift.  Persistent nonprogressive subarachnoid hemorrhage. 1/6 worsening edema on CT, 3% saline continued 1/7 Keppra increased for subclinical seizures on LTM EEG 1/8 Low-grade febrile 100.9 1/12 modafinil started 1/26 Seizure-like activity >>Given Ativan, LTM EEG hooked up, keprra increased 1/27 seizure activity noted on EEG 1/28 tolerated PSV at 10/5 all day  Consults:  Neuro IR PCCM  Procedures:  1/4 diagnostic angiogram with coiling 1/7 left hemicraniectomy 1/7 R IJ central line >> out 1/19 PEG tube 1/19 tracheostomy  Significant Diagnostic Tests:  1/3 >> CT angiogram head neck > small volume acute subarachnoid hemorrhage centered at the base of the left sylvian fissure.  6 x 6 x 9 mm irregular aneurysm arising from the left MCA bifurcation.  1/5 CTH >> Diffused and partially reabsorbed subarachnoid hemorrhage since yesterday. 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  1/6 CTH >> Progressive swelling of the left MCA  infarct with new midline shift measuring 6 mm. Nonprogressive subarachnoid hemorrhage. No evidence of new territory infarct  1/6 CTH  >> increasing midline shift 8 mm  1/6 >> LTM EEG evidence of epileptogenicity arising from right parietal region , no seizures  1/8 LTM EEG >> evidence ofcortical dysfunctionand epileptogenicity in left frontal region consistent with underlying craniotomy andstructural abnormality. After propofol was stopped, eeg showed periodic epileptiform discharges at 1-1.5hz  In left frontal region which were intermittent and rhythmic which is on the ictal-interictal continuum , worse compared to 1/7  1/9 LTM EEG similar to 1/8  1/10 echocardiogram>>EF 60-65%. No valvular abnormalities. Greater than 50% respiratory variability of IVC EEG 1/21 PL EDS left frontal region  Micro Data:  1/3 SARS/ flu >>neg 1/4 MRSA  >> neg 1/8 resp >> ng  Antimicrobials:  Unasyn 1/8>>1/12 Unasyn 1/20>>>1/25  Interim History / Subjective:  No acute events overnight.   Will trial patient on PSV 5/5 today   Objective   Blood pressure (!) 145/73, pulse 75, temperature 99.5 F (37.5 C), temperature source Axillary, resp. rate (!) 25, height 5\' 4"  (1.626 m), weight 98.3 kg, SpO2 100 %.    Vent Mode: PSV;CPAP FiO2 (%):  [30 %] 30 % Set Rate:  [15 bmp] 15 bmp Vt Set:  [430 mL] 430 mL PEEP:  [5 cmH20] 5 cmH20 Pressure Support:  [8 cmH20-10 cmH20] 8 cmH20 Plateau Pressure:  [17 cmH20-19 cmH20] 19 cmH20   Intake/Output Summary (Last 24 hours) at 07/11/2020 0926 Last data filed at 07/11/2020 0600 Gross per 24 hour  Intake 1289.72 ml  Output 1500 ml  Net -210.28 ml   Filed Weights   07/09/20  0500 07/10/20 0500 07/11/20 0500  Weight: 99.6 kg 99.6 kg 98.3 kg   Physical Exam: Gen: no acute distress HEENT:  Leftward eye deviation, sclera anicteric, left craniotomy, moist mucous membranes Neck:  Trach in place. Clean, no secretions around trach CV: Regular rate and rhythm; no  murmurs Resp: mildly course breath sounds with rhonchi, no wheezing Abd: + bowel sounds; soft, no distension Ext: No edema, warm Skin:no rash Neuro: does not follow commands. No response to verbal stimuli.   Resolved Hospital Problem list   Aspiration pneumonia--received 5d course of unasyn 1/8-1/12, again 1/20-1/25  Assessment & Plan:   Aneurysmal subarachnoid hemorrhage due to ruptured left MCA aneurysm s/p coiling.  Hunt & Hess score 1, Fisher grade 1. 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 and midline shift s/p hemi-craniectomy 1/7 EEG 1/21 PL EDS left frontal region  She continues to have intermittent seizure-like activity on EEG on 1/26 and 1/27   Plan: Continue Keppra 1500 twice daily (increased on 1/26) Continue valproate. Neurology and Neurosurgery following Ativan as needed Wean midodrine to 5mg  TID today from 10mg  TID Continue modafinil  Status post tracheostomy Status post PEG tube  Acute respiratory failure with hypoxia require mechanical ventilation  - Continue PSV trials on vent. If does well on 5/5 today we will switch to trach collar later today. - plan for LTAC vs vent SNF once seizures adequately controlled  Best practice (evaluated daily)  Diet: tube feeds Pain/Anxiety/Delirium protocol (if indicated): prn fentanyl, delirium precautions VAP protocol (if indicated): Per protocol DVT prophylaxis: SCDs only  GI prophylaxis: PPI Glucose control: None needed. Continue to monitor Mobility: Bedrest Disposition: ICU Family: per NS  Goals of Care:  Last date of multidisciplinary goals of care discussion: 1/16 Family and staff present: both daughters, and [place], PCCM Summary of discussion: Continue full aggressive care Follow up goals of care discussion due: daughters wish to pursue full scope of care   Code Status: Full   Labs   CBC: Recent Labs  Lab 07/07/20 0050 07/08/20 0259 07/09/20 0412 07/10/20 0818 07/11/20 0426   WBC 7.7 10.6* 7.9 6.8 6.9  NEUTROABS 4.6 6.8 5.0 4.2 4.1  HGB 9.7* 9.5* 9.8* 9.8* 10.0*  HCT 30.8* 32.1* 33.0* 31.4* 32.4*  MCV 90.3 91.5 92.4 91.0 91.0  PLT 428* 419* 377 356 461*    Basic Metabolic Panel: Recent Labs  Lab 07/05/20 0351 07/06/20 0935 07/07/20 0050 07/09/20 0412 07/10/20 0818  NA 144 143 141 141 143  K 3.6 3.8 3.6 4.0 4.2  CL 105 102 102 100 102  CO2 30 30 29  32 31  GLUCOSE 95 98 111* 89 121*  BUN 28* 22* 23* 32* 27*  CREATININE 0.75 0.65 0.67 0.84 0.81  CALCIUM 10.9* 10.8* 10.5* 10.8* 10.8*  MG  --   --  1.9  --   --   PHOS  --   --  3.2  --   --    GFR: Estimated Creatinine Clearance: 84.1 mL/min (by C-G formula based on SCr of 0.81 mg/dL). Recent Labs  Lab 07/08/20 0259 07/09/20 0412 07/10/20 0818 07/11/20 0426  WBC 10.6* 7.9 6.8 6.9    Liver Function Tests: No results for input(s): AST, ALT, ALKPHOS, BILITOT, PROT, ALBUMIN in the last 168 hours. No results for input(s): LIPASE, AMYLASE in the last 168 hours. No results for input(s): AMMONIA in the last 168 hours.  ABG    Component Value Date/Time   PHART 7.560 (H) 06/19/2020 1513  PCO2ART 31.6 (L) 06/19/2020 1513   PO2ART 138 (H) 06/19/2020 1513   HCO3 28.5 (H) 06/19/2020 1513   TCO2 29 06/19/2020 1513   ACIDBASEDEF 1.0 06/17/2020 0939   O2SAT 99.0 06/19/2020 1513      CBG: Recent Labs  Lab 07/10/20 1551 07/10/20 1915 07/10/20 2316 07/11/20 0329 07/11/20 0710  GLUCAP 101* 100* 90 71 113*    CC time: 35 minutes  Melody Comas, MD [facility] Office: [phone]  If no response to pager , please call [phone]  After 7:00 pm call Elink  [phone]    07/11/2020, 9:26 AM

## 2020-07-11 NOTE — Procedures (Incomplete)
Name:Denise Savage GHW:[id number] Epilepsy Attending:Cambria Osten [name] Referring Physician/Provider:Dr.Thomas Johnsie Cancel Duration:07/10/2020 7893 to1/29/20221711  Patient history:60 year old female with ruptured left MCA aneurysm status post coiling,continues to be encephalopathic.EEG to evaluate for seizures.  Level of alertness:awake, asleep  AEDs during EEG study:Keppra, VPA  Technical aspects: This EEG study was done with scalp electrodes positioned according to the 10-20 International system of electrode placement. Electrical activity was acquired at a sampling rate of 500Hz  and reviewed with a high frequency filter of 70Hz  and a low frequency filter of 1Hz . EEG data were recorded continuously and digitally stored.   Description:No clear posterior dominant rhythm was seen. Sleep was characterized by sleep spindles (12 to 14 Hz), maximal frontocentral region.EEG showed continuous generalizedand maximalleft frontal region 3-5Hz  theta-delta slowing.Periodic epileptiform dischargeswere seen in left frontal region at0.5-1Hz which were intermittent and rhythmic. Sharp waves were also seen in right centroparietal region.  Event button was pressed on 1/29/2022at 1675for intermittent gaze deviation. Concomitant EEG before, during or after the event did not show EEG change.  IMPRESSION: This study showed evidence ofcortical dysfunctionandepileptogenicity in left frontal region consistent with underlying craniotomy andstructural abnormality.There is also epileptogenicity arising from right centroparietal region. The periodic discharges in left frontal region are at times rhythmic and on the ictal-interictal spectrum with high potential for seizure. Additionally there ismoderatediffuse encephalopathy, nonspecific etiology.  One event was recorded on 07/11/2020 at 1620 during which per team patient was noted to have intermittent gaze deviation without  concomitant eeg change. However, focal motor seizure may not be seen on scalp eeg and therefore clinical correlation is recommended.       [name] 07/13/2020

## 2020-07-11 NOTE — Progress Notes (Addendum)
Subjective: No definite seizures overnight. No family at bedside today.  Interval events: Depakote increased from 500 every 8 hours to 750 every 8 hours, increased dose started at 2 PM on 1/28 Valproic acid level resulted at 60 on 1/29  ROS: Unable to obtain due to poor mental status  Examination  Vital signs in last 24 hours: Temp:  [99.1 F (37.3 C)-99.9 F (37.7 C)] 99.6 F (37.6 C) (01/29 0400) Pulse Rate:  [69-94] 75 (01/29 0750) Resp:  [15-35] 25 (01/29 0750) BP: (109-145)/(67-89) 145/73 (01/29 0600) SpO2:  [98 %-100 %] 100 % (01/29 0750) FiO2 (%):  [30 %] 30 % (01/29 0750) Weight:  [98.3 kg] 98.3 kg (01/29 0500)  General: lying in bed, not in apparent distress CVS: pulse-normal rate and rhythm RS: breathing comfortably, + tracheostomy Extremities: normal  Neuro: Opens eyes to noxious stimuli but did not follow commands for me or daughter, PERRLA, oculocephalic reflex intact, initially left gaze but then had episodes of right gaze (4:22 PM), jaw tremor at 4:30 PM for which event button was pressed by myself (signficant EEG artifact at the time). Sometimes dysconjugate, questionable movement to voice. Corneal stronger on the left than the right. Clear right facial weakness SensoriMotor: No response RUE, flicker RLE, LUE purposeful (localized to trach), 2/5 in the LLE (withdraws)   Basic Metabolic Panel: Recent Labs  Lab 07/05/20 0351 07/06/20 0935 07/07/20 0050 07/09/20 0412 07/10/20 0818  NA 144 143 141 141 143  K 3.6 3.8 3.6 4.0 4.2  CL 105 102 102 100 102  CO2 30 30 29  32 31  GLUCOSE 95 98 111* 89 121*  BUN 28* 22* 23* 32* 27*  CREATININE 0.75 0.65 0.67 0.84 0.81  CALCIUM 10.9* 10.8* 10.5* 10.8* 10.8*  MG  --   --  1.9  --   --   PHOS  --   --  3.2  --   --     CBC: Recent Labs  Lab 07/07/20 0050 07/08/20 0259 07/09/20 0412 07/10/20 0818 07/11/20 0426  WBC 7.7 10.6* 7.9 6.8 6.9  NEUTROABS 4.6 6.8 5.0 4.2 4.1  HGB 9.7* 9.5* 9.8* 9.8* 10.0*  HCT  30.8* 32.1* 33.0* 31.4* 32.4*  MCV 90.3 91.5 92.4 91.0 91.0  PLT 428* 419* 377 356 461*     Ref. Range 06/26/2020 05:55 07/09/2020 15:14 07/11/2020 04:26  Valproic Acid,S Latest Ref Range: 50.0 - 100.0 ug/mL 43 (L) 30 (L) 46 (L)    Albumin 2.1   Coagulation Studies: No results for input(s): LABPROT, INR in the last 72 hours.  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 -On EEG, periodic epileptiform discharges are only seen in left frontal region however clinically patient has LEFT upper extremity rhythmic twitching.  It is possible that due to motor weakness, seizures are not clinically visible in right upper extremity versus spread of seizures from left to right motor cortex but the area of involvement is too small to be visible on scalp EEG.  Recommendations: -Valproic acid level improving today, continue Depakote to 750 mg every 8 hours.   -Added on albumin in order  to confirm if assessment of total level of valproic acid may need to be adjusted based on hypoalbuminemia - Recheck Depakote level and ammonia again on tomorrow morning.  - Will tolerate ammonia of 60 today, continue to monitor  - Consider L-carnitine if remains elevated - Continue Keppra 1500 mg twice daily - Addendum: adding Vimpat 50 mg BID for additional seizure activity - Continue LTM EEG while we are adjusting seizure medications - Continue seizure precautions - As needed IV Ativan for clinical  seizure-like activity last more than 5 minutes - Management of rest of comorbidities per primary team  CRITICAL CARE Performed by: Brooke Dare MD-PhD [facility] [phone]   Total critical care time: 35 minutes, including updating family at bedside  Critical care time was exclusive of separately billable procedures and treating other patients.  Critical care was necessary to treat or prevent imminent or life-threatening deterioration.  Critical care was time spent personally by me on the following activities: development of treatment plan with patient and/or surrogate as well as nursing, discussions with consultants, evaluation of patient's response to treatment, examination of patient, obtaining history from patient or surrogate, ordering and performing treatments and interventions, ordering and review of laboratory studies, ordering and review of radiographic studies, pulse oximetry and re-evaluation of patient's condition.

## 2020-07-11 NOTE — Progress Notes (Signed)
Pt placed on 35% ATC and is tolerating well at this time. RN aware. RT to continue to monitor. 

## 2020-07-11 NOTE — Progress Notes (Signed)
Neurosurgery Service Progress Note  Subjective: No acute events overnight, no clinical sz reported overnight  Objective: Vitals:   07/11/20 0750 07/11/20 0800 07/11/20 0900 07/11/20 1000  BP:  129/66 129/71 129/73  Pulse: 75 80 82 82  Resp: (!) 25 (!) 26 (!) 23 (!) 26  Temp:  99.5 F (37.5 C)    TempSrc:  Axillary    SpO2: 100% 100% 100% 100%  Weight:      Height:        Physical Exam: Eyes closed, open some to voice, PERRL, gaze conjugate, not FC, w/d on the L, 0/5 on the R, incision c/d/i w/ staples  Assessment & Plan: 60 y.o. woman w/ ruptured MCA and vasospasm s/p hemicraniectomy, stable exam.   -f/u EEG read, neurology recs -goal BP normotensive -can d/c staples after EEG leads d/c'd -likely will require LTACH placement after seizures are controlled -CCM recs  Jadene Pierini  07/11/20 11:16 AM

## 2020-07-11 NOTE — Progress Notes (Signed)
LTM maint complete - no skin breakdown under:  Fp1 Fp2 F3 F4  Monitored by [facility] event button tested.

## 2020-07-11 NOTE — Progress Notes (Signed)
At 1734 pt began having twitching to her left hand and arm. Per order after seizure lasted 5 min, 2 mg ativan given. Total length of seizure 7 min. Event button pressed on EEG. Dr. Iver Nestle on the unit and notified.

## 2020-07-12 LAB — CBC WITH DIFFERENTIAL/PLATELET
Abs Immature Granulocytes: 0.09 10*3/uL — ABNORMAL HIGH (ref 0.00–0.07)
Basophils Absolute: 0 10*3/uL (ref 0.0–0.1)
Basophils Relative: 1 %
Eosinophils Absolute: 0.2 10*3/uL (ref 0.0–0.5)
Eosinophils Relative: 4 %
HCT: 32.8 % — ABNORMAL LOW (ref 36.0–46.0)
Hemoglobin: 9.8 g/dL — ABNORMAL LOW (ref 12.0–15.0)
Immature Granulocytes: 2 %
Lymphocytes Relative: 24 %
Lymphs Abs: 1.3 10*3/uL (ref 0.7–4.0)
MCH: 27.8 pg (ref 26.0–34.0)
MCHC: 29.9 g/dL — ABNORMAL LOW (ref 30.0–36.0)
MCV: 92.9 fL (ref 80.0–100.0)
Monocytes Absolute: 0.7 10*3/uL (ref 0.1–1.0)
Monocytes Relative: 12 %
Neutro Abs: 3.1 10*3/uL (ref 1.7–7.7)
Neutrophils Relative %: 57 %
Platelets: 257 10*3/uL (ref 150–400)
RBC: 3.53 MIL/uL — ABNORMAL LOW (ref 3.87–5.11)
RDW: 17 % — ABNORMAL HIGH (ref 11.5–15.5)
WBC: 5.5 10*3/uL (ref 4.0–10.5)
nRBC: 0.5 % — ABNORMAL HIGH (ref 0.0–0.2)

## 2020-07-12 LAB — GLUCOSE, CAPILLARY
Glucose-Capillary: 89 mg/dL (ref 70–99)
Glucose-Capillary: 101 mg/dL — ABNORMAL HIGH (ref 70–99)
Glucose-Capillary: 73 mg/dL (ref 70–99)
Glucose-Capillary: 98 mg/dL (ref 70–99)
Glucose-Capillary: 98 mg/dL (ref 70–99)
Glucose-Capillary: 77 mg/dL (ref 70–99)

## 2020-07-12 LAB — AMMONIA: Ammonia: 42 umol/L — ABNORMAL HIGH (ref 9–35)

## 2020-07-12 LAB — VALPROIC ACID LEVEL: Valproic Acid Lvl: 48 ug/mL — ABNORMAL LOW (ref 50.0–100.0)

## 2020-07-12 NOTE — Procedures (Signed)
Name:Denise Savage QZR:[id number] Epilepsy Attending:Dorthy Magnussen [name] Referring Physician/Provider:Dr.Thomas Johnsie Cancel Duration:07/11/2020 1711 to1/30/20221711  Patient history:60 year old female with ruptured left MCA aneurysm status post coiling,continues to be encephalopathic.EEG to evaluate for seizures.  Level of alertness:awake, asleep  AEDs during EEG study:Keppra, VPA, Vimpat  Technical aspects: This EEG study was done with scalp electrodes positioned according to the 10-20 International system of electrode placement. Electrical activity was acquired at a sampling rate of 500Hz  and reviewed with a high frequency filter of 70Hz  and a low frequency filter of 1Hz . EEG data were recorded continuously and digitally stored.   Description:No clear posterior dominant rhythm was seen. Sleep was characterized by sleep spindles (12 to 14 Hz), maximal frontocentral region.EEG showed continuous generalizedand maximalleft frontal region 3-5Hz  theta-delta slowing.Periodic epileptiform dischargeswere seen in left frontal region at0.5-1Hz which were intermittent and rhythmic. Sharp waves were also seen in right centroparietal region.  Event button was pressed on 1/29/2022at 1731 during which patient was noted to have left hand rhythmic movements. Concomitant EEG showed periodic epileptiform discharges in left frontal region at 1 Hz which appeared rhythmic.  IMPRESSION: This study showed  onefocal motor seizureson 1/29/2022at1731during which patient was noted to have left hand rhythmic movements.Concomitant EEG showed periodic epileptiform discharges in left frontal region at 1 Hz which appeared rhythmic.   There is also evidence ofcortical dysfunctionandepileptogenicity in left frontal region consistent with underlying craniotomy andstructural abnormality.Epileptogenicity arising from right centroparietal region was also noted. Additionally there  ismoderatediffuse encephalopathy, nonspecific etiology.  Samuella Rasool 

## 2020-07-12 NOTE — Progress Notes (Signed)
Subjective: No seizures since starting Vimpat  Interval events: After 7-minute seizure yesterday afternoon, started Vimpat 50 mg twice daily  ROS: Unable to obtain due to poor mental status  Examination  Vital signs in last 24 hours: Temp:  [97.4 F (36.3 C)-99.9 F (37.7 C)] 98 F (36.7 C) (01/30 0800) Pulse Rate:  [71-102] 86 (01/30 0801) Resp:  [16-57] 25 (01/30 0801) BP: (102-138)/(60-85) 124/75 (01/30 0800) SpO2:  [99 %-100 %] 100 % (01/30 0801) FiO2 (%):  [30 %-35 %] 35 % (01/30 0801) Weight:  [99.8 kg] 99.8 kg (01/30 0500)  General: lying in bed, not in apparent distress CVS: pulse-normal rate and rhythm RS: breathing comfortably, + tracheostomy, getting chest PT with limited reaction to this stimulation Extremities: normal  Neuro: Opens eyes to noxious stimuli but did not follow commands for me, PERRLA, oculocephalic reflex intact, remained in left gaze, conjugate today. Corneal stronger on the left than the right. Clear right facial weakness Sensorimotor: Trace movement right upper extremity, no movement RLE, LUE purposeful (localized to noxious stim of the right upper extremity), 2/5 in the LLE (withdraws)   Basic Metabolic Panel: Recent Labs  Lab 07/06/20 0935 07/07/20 0050 07/09/20 0412 07/10/20 0818  NA 143 141 141 143  K 3.8 3.6 4.0 4.2  CL 102 102 100 102  CO2 30 29 32 31  GLUCOSE 98 111* 89 121*  BUN 22* 23* 32* 27*  CREATININE 0.65 0.67 0.84 0.81  CALCIUM 10.8* 10.5* 10.8* 10.8*  MG  --  1.9  --   --   PHOS  --  3.2  --   --     CBC: Recent Labs  Lab 07/08/20 0259 07/09/20 0412 07/10/20 0818 07/11/20 0426 07/12/20 0522  WBC 10.6* 7.9 6.8 6.9 5.5  NEUTROABS 6.8 5.0 4.2 4.1 3.1  HGB 9.5* 9.8* 9.8* 10.0* 9.8*  HCT 32.1* 33.0* 31.4* 32.4* 32.8*  MCV 91.5 92.4 91.0 91.0 92.9  PLT 419* 377 356 461* 257     Ref. Range 06/26/2020 05:55 07/09/2020 15:14 07/11/2020 04:26  Valproic Acid,S Latest Ref Range: 50.0 - 100.0 ug/mL 43 (L) 30 (L) 46 (L)     Albumin 2.1   Coagulation Studies: No results for input(s): LABPROT, INR in the last 72 hours.  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 -On EEG, periodic epileptiform discharges are only seen in left frontal region however clinically patient has LEFT upper extremity rhythmic twitching.  It is possible that due to motor weakness, seizures are not clinically visible in right upper extremity versus spread of seizures from left to right motor cortex but the area of involvement is too small to be visible on scalp EEG.  Clinically on 1/29 she did have an episode of right gaze prior to having more visible seizure activity, favoring this hypothesis.  Recommendations: -Valproic acid level improving today, continue Depakote to 750 mg every 8 hours; low normal levels likely therapeutic in the setting of hypoalbuminemia  -Ammonia level of 40 is acceptable, no indication to recheck at this time - Continue Keppra 1500 mg twice daily - Continue  Vimpat 50 mg BID  - Continue LTM EEG while we are adjusting seizure medications - Continue seizure precautions - As needed IV Ativan for clinical seizure-like activity last more than 5 minutes - Management of rest of comorbidities per primary team  CRITICAL CARE   Total critical care time: 35 minutes, including updating family at bedside  Critical care time was exclusive of separately billable procedures and treating  other patients.  Critical care was necessary to treat or prevent imminent or life-threatening deterioration.  Critical care was time spent personally by me on the following activities: development of treatment plan with patient and/or surrogate as well as nursing, discussions with consultants, evaluation of patient's response to treatment, examination of patient, obtaining history from patient or surrogate, ordering and performing treatments and interventions, ordering and review of laboratory studies, ordering and review of radiographic studies, pulse oximetry and re-evaluation of patient's condition.  Brooke Dare MD-PhD [facility] [phone]

## 2020-07-12 NOTE — Progress Notes (Signed)
LTM maint complete - no skin breakdown under:  Fp1 Fp2 F3 F4 , maintance  Fz Cz Pz O2 P7 [facility] Monitored

## 2020-07-12 NOTE — Progress Notes (Signed)
Neurosurgery Service Progress Note  Subjective: No acute events overnight, one clinical sz reported overnight lasting ~74min  Objective: Vitals:   07/12/20 0500 07/12/20 0600 07/12/20 0800 07/12/20 0801  BP: 107/66  124/75   Pulse: 79 85 83 86  Resp: 16 18 (!) 23 (!) 25  Temp:   98 F (36.7 C)   TempSrc:   Axillary   SpO2: 100% 100% 100% 100%  Weight: 99.8 kg     Height:        Physical Exam: Eyes closed, forced closed by pt when I tried to open them, PERRL, gaze conjugate, not FC, w/d on the L, 0/5 on the R, incision c/d/i w/ staples  Assessment & Plan: 60 y.o. woman w/ ruptured MCA and vasospasm s/p hemicraniectomy, stable exam.   -f/u EEG read, neurology recs, still adjusting meds, continuous EEG -goal BP normotensive -can d/c staples after EEG leads d/c'd -likely will require LTACH placement after seizures are controlled -CCM recs  Jadene Pierini  07/12/20 8:59 AM

## 2020-07-12 NOTE — Progress Notes (Signed)
NAME:  Denise Savage, MRN:  [id number], DOB:  04-Jul-1960, LOS: 26 ADMISSION DATE:  06/15/2020, CONSULTATION DATE:  06/16/20 REFERRING MD:  Dr. Maurice Small, CHIEF COMPLAINT:  VDRF   Brief History:  60 year old female presented headache x 5 days found to have left sylvian fissure subarachnoid hemorrhage with a left MCA bifurcation aneurysm.  Transferred to [facility] for further care and went for diagnostic angiography with coiling, complicated by intraoperative rupture and subsequent severe vasospasm leading to malignant left MCA stroke.  She returned to the ICU intubated.  Past Medical History:   has a past medical history of Abdominal hernia.  Significant Hospital Events:  1/3 present to [facility] emergency department 1/4 transferred to [facility], NSGY admitting/ taken to neuro IR for coiling.  Remained on vent postop 1/5 more lethargic and unresponsive, stat head CT was done which showed progressive left MCA infarct with brain edema and 6 mm midline shift.  Persistent nonprogressive subarachnoid hemorrhage. 1/6 worsening edema on CT, 3% saline continued 1/7 Keppra increased for subclinical seizures on LTM EEG 1/8 Low-grade febrile 100.9 1/12 modafinil started 1/26 Seizure-like activity >>Given Ativan, LTM EEG hooked up, keprra increased 1/27 seizure activity noted on EEG 1/28 tolerated PSV at 10/5 all day  Consults:  Neuro IR PCCM  Procedures:  1/4 diagnostic angiogram with coiling 1/7 left hemicraniectomy 1/7 R IJ central line >> out 1/19 PEG tube 1/19 tracheostomy  Significant Diagnostic Tests:  1/3 >> CT angiogram head neck > small volume acute subarachnoid hemorrhage centered at the base of the left sylvian fissure.  6 x 6 x 9 mm irregular aneurysm arising from the left MCA bifurcation.  1/5 CTH >> Diffused and partially reabsorbed subarachnoid hemorrhage since yesterday. 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  1/6 CTH >> Progressive swelling of the left MCA  infarct with new midline shift measuring 6 mm. Nonprogressive subarachnoid hemorrhage. No evidence of new territory infarct  1/6 CTH  >> increasing midline shift 8 mm  1/6 >> LTM EEG evidence of epileptogenicity arising from right parietal region , no seizures  1/8 LTM EEG >> evidence ofcortical dysfunctionand epileptogenicity in left frontal region consistent with underlying craniotomy andstructural abnormality. After propofol was stopped, eeg showed periodic epileptiform discharges at 1-1.5hz  In left frontal region which were intermittent and rhythmic which is on the ictal-interictal continuum , worse compared to 1/7  1/9 LTM EEG similar to 1/8  1/10 echocardiogram>>EF 60-65%. No valvular abnormalities. Greater than 50% respiratory variability of IVC EEG 1/21 PL EDS left frontal region  Micro Data:  1/3 SARS/ flu >>neg 1/4 MRSA  >> neg 1/8 resp >> ng  Antimicrobials:  Unasyn 1/8>>1/12 Unasyn 1/20>>>1/25  Interim History / Subjective:  Tolerated Trach collar yesterday and placed back on vent overnight.   Back on trach collar this morning and doing well. Has decent amount of secretions from trach.  No other acute events per nursing.   Objective   Blood pressure 131/82, pulse 98, temperature 98 F (36.7 C), temperature source Axillary, resp. rate (!) 39, height 5\' 4"  (1.626 m), weight 99.8 kg, SpO2 100 %.    Vent Mode: PRVC FiO2 (%):  [30 %-35 %] 35 % Set Rate:  [16 bmp] 16 bmp Vt Set:  [430 mL] 430 mL PEEP:  [5 cmH20] 5 cmH20 Plateau Pressure:  [17 cmH20-18 cmH20] 18 cmH20   Intake/Output Summary (Last 24 hours) at 07/12/2020 1031 Last data filed at 07/12/2020 1000 Gross per 24 hour  Intake 1612.77 ml  Output 950 ml  Net 662.77 ml   Filed Weights   07/10/20 0500 07/11/20 0500 07/12/20 0500  Weight: 99.6 kg 98.3 kg 99.8 kg   Physical Exam: Gen: no acute distress HEENT:  Leftward eye deviation, sclera anicteric, left craniotomy, moist mucous membranes Neck:   Trach in place. Clean, whitish secretions in trach CV: Regular rate and rhythm; no murmurs Resp: course breath sounds, no wheezing Abd: + bowel sounds; soft, no distension, PEG in place Ext: No edema, warm Skin: no rash Neuro: does not follow commands. No response to verbal stimuli.  Resolved Hospital Problem list   Aspiration pneumonia--received 5d course of unasyn 1/8-1/12, again 1/20-1/25  Assessment & Plan:   Aneurysmal subarachnoid hemorrhage due to ruptured left MCA aneurysm s/p coiling.  Hunt & Hess score 1, Fisher grade 1. 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 and midline shift s/p hemi-craniectomy 1/7 EEG 1/21 PL EDS left frontal region  She continues to have intermittent seizure-like activity on EEG on 1/26 and 1/27   Plan: Continue Keppra 1500 twice daily (increased on 1/26) Continue valproate. Monitoring ammonia levels Neurology and Neurosurgery following Ativan as needed Stop midodrine today Continue modafinil  Status post tracheostomy Status post PEG tube  Acute respiratory failure with hypoxia require mechanical ventilation  - Continue trach collar trial today and overnight if possible - plan for LTAC vs vent SNF once seizures adequately controlled  Best practice (evaluated daily)  Diet: tube feeds Pain/Anxiety/Delirium protocol (if indicated): n/a VAP protocol (if indicated): Per protocol DVT prophylaxis: SCDs only  GI prophylaxis: PPI Glucose control: None needed. Continue to monitor Mobility: Bedrest Disposition: ICU Family: per NS  Goals of Care:  Last date of multidisciplinary goals of care discussion: 1/16 Family and staff present: both daughters, [name] and Gordon, PCCM Summary of discussion: Continue full aggressive care Follow up goals of care discussion due: daughters wish to pursue full scope of care   Code Status: Full   Labs   CBC: Recent Labs  Lab 07/08/20 0259 07/09/20 0412 07/10/20 0818 07/11/20 0426  07/12/20 0522  WBC 10.6* 7.9 6.8 6.9 5.5  NEUTROABS 6.8 5.0 4.2 4.1 3.1  HGB 9.5* 9.8* 9.8* 10.0* 9.8*  HCT 32.1* 33.0* 31.4* 32.4* 32.8*  MCV 91.5 92.4 91.0 91.0 92.9  PLT 419* 377 356 461* 257    Basic Metabolic Panel: Recent Labs  Lab 07/06/20 0935 07/07/20 0050 07/09/20 0412 07/10/20 0818  NA 143 141 141 143  K 3.8 3.6 4.0 4.2  CL 102 102 100 102  CO2 30 29 32 31  GLUCOSE 98 111* 89 121*  BUN 22* 23* 32* 27*  CREATININE 0.65 0.67 0.84 0.81  CALCIUM 10.8* 10.5* 10.8* 10.8*  MG  --  1.9  --   --   PHOS  --  3.2  --   --    GFR: Estimated Creatinine Clearance: 84.8 mL/min (by C-G formula based on SCr of 0.81 mg/dL). Recent Labs  Lab 07/09/20 0412 07/10/20 0818 07/11/20 0426 07/12/20 0522  WBC 7.9 6.8 6.9 5.5    Liver Function Tests: Recent Labs  Lab 07/11/20 0426  ALBUMIN 2.1*   No results for input(s): LIPASE, AMYLASE in the last 168 hours. Recent Labs  Lab 07/11/20 0959 07/12/20 0522  AMMONIA 61* 42*    ABG    Component Value Date/Time   PHART 7.560 (H) 06/19/2020 1513   PCO2ART 31.6 (L) 06/19/2020 1513   PO2ART 138 (H) 06/19/2020 1513   HCO3 28.5 (H) 06/19/2020  1513   TCO2 29 06/19/2020 1513   ACIDBASEDEF 1.0 06/17/2020 0939   O2SAT 99.0 06/19/2020 1513      CBG: Recent Labs  Lab 07/11/20 1537 07/11/20 1916 07/11/20 2322 07/12/20 0314 07/12/20 0727  GLUCAP 84 81 107* 89 101*    CC time: 35 minutes  Melody Comas, MD [facility] Office: [phone]  If no response to pager , please call [phone]  After 7:00 pm call Elink  [phone]    07/12/2020, 10:31 AM

## 2020-07-13 LAB — CBC WITH DIFFERENTIAL/PLATELET
Abs Immature Granulocytes: 0.06 10*3/uL (ref 0.00–0.07)
Basophils Absolute: 0 10*3/uL (ref 0.0–0.1)
Basophils Relative: 1 %
Eosinophils Absolute: 0.3 10*3/uL (ref 0.0–0.5)
Eosinophils Relative: 4 %
HCT: 31.9 % — ABNORMAL LOW (ref 36.0–46.0)
Hemoglobin: 9.9 g/dL — ABNORMAL LOW (ref 12.0–15.0)
Immature Granulocytes: 1 %
Lymphocytes Relative: 22 %
Lymphs Abs: 1.4 10*3/uL (ref 0.7–4.0)
MCH: 28.4 pg (ref 26.0–34.0)
MCHC: 31 g/dL (ref 30.0–36.0)
MCV: 91.7 fL (ref 80.0–100.0)
Monocytes Absolute: 0.9 10*3/uL (ref 0.1–1.0)
Monocytes Relative: 14 %
Neutro Abs: 3.8 10*3/uL (ref 1.7–7.7)
Neutrophils Relative %: 58 %
Platelets: 231 10*3/uL (ref 150–400)
RBC: 3.48 MIL/uL — ABNORMAL LOW (ref 3.87–5.11)
RDW: 17.2 % — ABNORMAL HIGH (ref 11.5–15.5)
WBC: 6.5 10*3/uL (ref 4.0–10.5)
nRBC: 0.5 % — ABNORMAL HIGH (ref 0.0–0.2)

## 2020-07-13 LAB — GLUCOSE, CAPILLARY
Glucose-Capillary: 113 mg/dL — ABNORMAL HIGH (ref 70–99)
Glucose-Capillary: 104 mg/dL — ABNORMAL HIGH (ref 70–99)
Glucose-Capillary: 77 mg/dL (ref 70–99)
Glucose-Capillary: 111 mg/dL — ABNORMAL HIGH (ref 70–99)
Glucose-Capillary: 78 mg/dL (ref 70–99)
Glucose-Capillary: 123 mg/dL — ABNORMAL HIGH (ref 70–99)

## 2020-07-13 LAB — BASIC METABOLIC PANEL
Anion gap: 9 (ref 5–15)
BUN: 28 mg/dL — ABNORMAL HIGH (ref 6–20)
CO2: 30 mmol/L (ref 22–32)
Calcium: 10.7 mg/dL — ABNORMAL HIGH (ref 8.9–10.3)
Chloride: 99 mmol/L (ref 98–111)
Creatinine, Ser: 0.8 mg/dL (ref 0.44–1.00)
GFR, Estimated: >60 mL/min (ref >60)
Glucose, Bld: 111 mg/dL — ABNORMAL HIGH (ref 70–99)
Potassium: 4.2 mmol/L (ref 3.5–5.1)
Sodium: 138 mmol/L (ref 135–145)

## 2020-07-13 NOTE — Procedures (Signed)
Name:Denise Savage BZJ:[id number] Epilepsy Attending:Aarya Quebedeaux [name] Referring Physician/Provider:Dr.Thomas Johnsie Cancel Duration:07/12/2020 1711 to1/31/20221158  Patient history:60 year old female with ruptured left MCA aneurysm status post coiling,continues to be encephalopathic.EEG to evaluate for seizures.  Level of alertness:awake, asleep  AEDs during EEG study:Keppra, VPA, Vimpat  Technical aspects: This EEG study was done with scalp electrodes positioned according to the 10-20 International system of electrode placement. Electrical activity was acquired at a sampling rate of 500Hz  and reviewed with a high frequency filter of 70Hz  and a low frequency filter of 1Hz . EEG data were recorded continuously and digitally stored.   Description:No clear posterior dominant rhythm was seen. Sleep was characterized by sleep spindles (12 to 14 Hz), maximal frontocentral region.EEG showed continuous generalizedand maximalleft frontal region 3-5Hz  theta-delta slowing.Periodic epileptiform dischargeswere seen in left frontal region at0.5-1Hz which were intermittentand rhythmic.Sharp waves were also seen in right frontal region.  IMPRESSION: This study showed evidence ofcortical dysfunctionandepileptogenicity in left frontal region consistent with underlying craniotomy andstructural abnormality.Epileptogenicity arising from right frontal region was also noted.Additionally there ismoderatediffuse encephalopathy, nonspecific etiology.  [name] 

## 2020-07-13 NOTE — Progress Notes (Signed)
NAME:  Denise Savage, MRN:  [id number], DOB:  09/14/1960, LOS: 27 ADMISSION DATE:  06/15/2020, CONSULTATION DATE:  06/16/20 REFERRING MD:  Dr. Maurice Small, CHIEF COMPLAINT:  VDRF   Brief History:  60 year old female presented headache x 5 days found to have left sylvian fissure subarachnoid hemorrhage with a left MCA bifurcation aneurysm.  Transferred to [facility] for further care and went for diagnostic angiography with coiling, complicated by intraoperative rupture and subsequent severe vasospasm leading to malignant left MCA stroke.  She returned to the ICU intubated.  Past Medical History:   has a past medical history of Abdominal hernia.  Significant Hospital Events:  1/3 present to [facility] emergency department 1/4 transferred to [facility], NSGY admitting/ taken to neuro IR for coiling.  Remained on vent postop 1/5 more lethargic and unresponsive, stat head CT was done which showed progressive left MCA infarct with brain edema and 6 mm midline shift.  Persistent nonprogressive subarachnoid hemorrhage. 1/6 worsening edema on CT, 3% saline continued 1/7 Keppra increased for subclinical seizures on LTM EEG 1/8 Low-grade febrile 100.9 1/12 modafinil started 1/26 Seizure-like activity >>Given Ativan, LTM EEG hooked up, keprra increased 1/27 seizure activity noted on EEG 1/28 tolerated PSV at 10/5 all day  Consults:  Neuro IR PCCM  Procedures:  1/4 diagnostic angiogram with coiling 1/7 left hemicraniectomy 1/7 R IJ central line >> out 1/19 PEG tube 1/19 tracheostomy  Significant Diagnostic Tests:  1/3 >> CT angiogram head neck > small volume acute subarachnoid hemorrhage centered at the base of the left sylvian fissure.  6 x 6 x 9 mm irregular aneurysm arising from the left MCA bifurcation.  1/5 CTH >> Diffused and partially reabsorbed subarachnoid hemorrhage since yesterday. 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  1/6 CTH >> Progressive swelling of the left MCA  infarct with new midline shift measuring 6 mm. Nonprogressive subarachnoid hemorrhage. No evidence of new territory infarct  1/6 CTH  >> increasing midline shift 8 mm  1/6 >> LTM EEG evidence of epileptogenicity arising from right parietal region , no seizures  1/8 LTM EEG >> evidence ofcortical dysfunctionand epileptogenicity in left frontal region consistent with underlying craniotomy andstructural abnormality. After propofol was stopped, eeg showed periodic epileptiform discharges at 1-1.5hz  In left frontal region which were intermittent and rhythmic which is on the ictal-interictal continuum , worse compared to 1/7  1/9 LTM EEG similar to 1/8  1/10 echocardiogram>>EF 60-65%. No valvular abnormalities. Greater than 50% respiratory variability of IVC EEG 1/21 PL EDS left frontal region  Micro Data:  1/3 SARS/ flu >>neg 1/4 MRSA  >> neg 1/8 resp >> ng  Antimicrobials:  Unasyn 1/8>>1/12 Unasyn 1/20>>>1/25  Interim History / Subjective:  Tolerated Trach collar yesterday for a few hours. Placed back on vent yesterday afternoon due to tachypnea.   No further seizures noted on cEEG since adding Vimpat on 1/29  No other acute events per nursing.   Objective   Blood pressure 113/78, pulse 81, temperature 98.8 F (37.1 C), temperature source Axillary, resp. rate 16, height 5\' 4"  (1.626 m), weight 99 kg, SpO2 100 %.    Vent Mode: PRVC FiO2 (%):  [30 %-35 %] 30 % Set Rate:  [16 bmp] 16 bmp Vt Set:  [430 mL] 430 mL PEEP:  [5 cmH20] 5 cmH20 Plateau Pressure:  [16 cmH20-20 cmH20] 16 cmH20   Intake/Output Summary (Last 24 hours) at 07/13/2020 0728 Last data filed at 07/13/2020 0600 Gross per 24 hour  Intake 1685.91 ml  Output 1050 ml  Net 635.91 ml   Filed Weights   07/11/20 0500 07/12/20 0500 07/13/20 0500  Weight: 98.3 kg 99.8 kg 99 kg   Physical Exam: Gen: no acute distress, on vent HEENT:  Leftward eye deviation, sclera anicteric, left craniotomy, moist mucous  membranes Neck:  Trach in place.  CV: Regular rate and rhythm; no murmurs Resp: course breath sounds, no wheezing Abd: + bowel sounds; soft, no distension, PEG in place Ext: No edema, warm Skin: no rash Neuro: does not follow commands. Awake/alert this morning  Resolved Hospital Problem list   Aspiration pneumonia--received 5d course of unasyn 1/8-1/12, again 1/20-1/25  Assessment & Plan:   Aneurysmal subarachnoid hemorrhage due to ruptured left MCA aneurysm s/p coiling.  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 and midline shift s/p hemi-craniectomy 1/7 EEG 1/21 PL EDS left frontal region  She continues to have intermittent seizure-like activity on EEG on 1/26 and 1/27 No seizure activity since vimpat added on 1/29   Plan: Continue Keppra 1500 twice daily (increased on 1/26) Continue valproate. Continue vimpat  Monitoring ammonia levels Neurology and Neurosurgery following Ativan as needed Continue modafinil  Acute respiratory failure with hypoxia require mechanical ventilation Status post tracheostomy - Continue trach collar trials as able - plan for LTAC vs vent SNF once seizures adequately controlled  Status post PEG tube  - Continue tube feeds  Best practice (evaluated daily)  Diet: tube feeds Pain/Anxiety/Delirium protocol (if indicated): n/a VAP protocol (if indicated): Per protocol DVT prophylaxis: SCDs only  GI prophylaxis: PPI Glucose control: None needed. Continue to monitor Mobility: Bedrest Disposition: ICU Family: per NS  Goals of Care:  Last date of multidisciplinary goals of care discussion: 1/16 Family and staff present: both daughters, [name] and Filer, PCCM Summary of discussion: Continue full aggressive care Follow up goals of care discussion due: daughters wish to pursue full scope of care   Code Status: Full   Labs   CBC: Recent Labs  Lab 07/09/20 0412 07/10/20 0818 07/11/20 0426 07/12/20 0522 07/13/20 0335  WBC 7.9  6.8 6.9 5.5 6.5  NEUTROABS 5.0 4.2 4.1 3.1 3.8  HGB 9.8* 9.8* 10.0* 9.8* 9.9*  HCT 33.0* 31.4* 32.4* 32.8* 31.9*  MCV 92.4 91.0 91.0 92.9 91.7  PLT 377 356 461* 257 231    Basic Metabolic Panel: Recent Labs  Lab 07/06/20 0935 07/07/20 0050 07/09/20 0412 07/10/20 0818 07/13/20 0335  NA 143 141 141 143 138  K 3.8 3.6 4.0 4.2 4.2  CL 102 102 100 102 99  CO2 30 29 32 31 30  GLUCOSE 98 111* 89 121* 111*  BUN 22* 23* 32* 27* 28*  CREATININE 0.65 0.67 0.84 0.81 0.80  CALCIUM 10.8* 10.5* 10.8* 10.8* 10.7*  MG  --  1.9  --   --   --   PHOS  --  3.2  --   --   --    GFR: Estimated Creatinine Clearance: 85.5 mL/min (by C-G formula based on SCr of 0.8 mg/dL). Recent Labs  Lab 07/10/20 0818 07/11/20 0426 07/12/20 0522 07/13/20 0335  WBC 6.8 6.9 5.5 6.5    Liver Function Tests: Recent Labs  Lab 07/11/20 0426  ALBUMIN 2.1*   No results for input(s): LIPASE, AMYLASE in the last 168 hours. Recent Labs  Lab 07/11/20 0959 07/12/20 0522  AMMONIA 61* 42*    ABG    Component Value Date/Time   PHART 7.560 (H) 06/19/2020 1513   PCO2ART 31.6 (L) 06/19/2020 1513  PO2ART 138 (H) 06/19/2020 1513   HCO3 28.5 (H) 06/19/2020 1513   TCO2 29 06/19/2020 1513   ACIDBASEDEF 1.0 06/17/2020 0939   O2SAT 99.0 06/19/2020 1513      CBG: Recent Labs  Lab 07/12/20 1105 07/12/20 1519 07/12/20 1935 07/12/20 2309 07/13/20 0324  GLUCAP 73 98 98 77 113*    CC time: 33 minutes  Melody Comas, MD [facility] Office: [phone]  If no response to pager , please call [phone]  After 7:00 pm call Elink  [phone]

## 2020-07-13 NOTE — TOC Progression Note (Signed)
Transition of Care [facility]) - Progression Note    Patient Details  Name: Denise Savage MRN: [id number] Date of Birth: 30-Jul-1960  Transition of Care [facility]) CM/SW Contact  Mearl Latin, LCSW Phone Number: 07/13/2020, 11:59 AM  Clinical Narrative:    CSW left voicemail for [facility] SNF to follow up on referral. Case is also being followed by [employer] for Medicaid/Disability.    Expected Discharge Plan: Skilled Nursing Facility Barriers to Discharge: Continued Medical Work up,SNF Pending bed offer  Expected Discharge Plan and Services Expected Discharge Plan: Skilled Nursing Facility     Post Acute Care Choice: Skilled Nursing Facility Living arrangements for the past 2 months: Single Family Home                                       Social Determinants of Health (SDOH) Interventions    Readmission Risk Interventions No flowsheet data found.

## 2020-07-13 NOTE — Progress Notes (Addendum)
Neurosurgery Service Progress Note  Subjective: No acute events overnight  Objective: Vitals:   07/13/20 1101 07/13/20 1146 07/13/20 1200 07/13/20 1300  BP:   (!) 143/88 99/62  Pulse:   (!) 103 95  Resp:   17 16  Temp:  98.1 F (36.7 C)    TempSrc:  Axillary    SpO2: 96%  96% 96%  Weight:      Height:        Physical Exam: Eyes closed, forced closed by pt when I tried to open them, some blinking / rhythmic eye closure that is irregular but frequent ~1 Hz, PERRL, R gaze preference but not consistently to the right, not FC, w/d on the L, 0/5 on the R L hand clonic movements, not temporally related to eye closure movements  incision c/d/i w/ staples  Assessment & Plan: 60 y.o. woman w/ ruptured MCA and vasospasm s/p hemicraniectomy, stable exam.   -neurology recs -goal BP normotensive -can d/c scalp staples -will require LTACH placement, she's neurologically stable for transfer to [facility] -CCM recs  Jadene Pierini  07/13/20 1:36 PM

## 2020-07-13 NOTE — Progress Notes (Signed)
LTM EEG discontinued - no skin breakdown at unhook.   

## 2020-07-13 NOTE — Progress Notes (Signed)
Subjective: No further seizures overnight.  Per RN, patient opens her eyes but still not following commands.  [id number] to obtain due to poor mental status  Examination  Vital signs in last 24 hours: Temp:  [98.8 F (37.1 C)-99.5 F (37.5 C)] 99 F (37.2 C) (01/31 0800) Pulse Rate:  [80-100] 82 (01/31 0800) Resp:  [16-39] 22 (01/31 0800) BP: (96-135)/(69-89) 125/80 (01/31 0800) SpO2:  [100 %] 100 % (01/31 0800) FiO2 (%):  [30 %-35 %] 30 % (01/31 0739) Weight:  [99 kg] 99 kg (01/31 0500)  General: lying in bed, not in apparent distress CVS: pulse-normal rate and rhythm RS: breathing comfortably, + tracheostomy Extremities: normal  Neuro: Opens eyes to noxious stimuli but did not follow commands, PERRLA, oculocephalic reflex intact, antigravity strength in left upper and left lower extremity, withdraws to noxious stimuli in right lower extremity, no movement in right upper extremity.  Basic Metabolic Panel: Recent Labs  Lab 07/07/20 0050 07/09/20 0412 07/10/20 0818 07/13/20 0335  NA 141 141 143 138  K 3.6 4.0 4.2 4.2  CL 102 100 102 99  CO2 29 32 31 30  GLUCOSE 111* 89 121* 111*  BUN 23* 32* 27* 28*  CREATININE 0.67 0.84 0.81 0.80  CALCIUM 10.5* 10.8* 10.8* 10.7*  MG 1.9  --   --   --   PHOS 3.2  --   --   --     CBC: Recent Labs  Lab 07/09/20 0412 07/10/20 0818 07/11/20 0426 07/12/20 0522 07/13/20 0335  WBC 7.9 6.8 6.9 5.5 6.5  NEUTROABS 5.0 4.2 4.1 3.1 3.8  HGB 9.8* 9.8* 10.0* 9.8* 9.9*  HCT 33.0* 31.4* 32.4* 32.8* 31.9*  MCV 92.4 91.0 91.0 92.9 91.7  PLT 377 356 461* 257 231     Coagulation Studies: No results for input(s): LABPROT, INR in the last 72 hours.  Imaging No new brain imaging overnight  ASSESSMENT/PLAN:60 year old female with left MCA aneurysmal rupture status post coiling now noted to have seizures.  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 -On EEG, periodic epileptiform discharges are only seen in left frontal region however clinically patient hasLEFTupper extremity rhythmic twitching. It is possible that due to motor weakness, seizures are not clinically visible in right upper extremity versus spread of seizures from left to right motor cortex but the area of involvement is too small to be visible on scalp EEG.  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  It is difficult to prognosticate at this point how much recovery she will have. -Continue seizure precautions -As needed IV Ativan for clinical seizure-like activity last more than 5 minutes -Management of rest of comorbidities per primary team  I have spent a total of 35 minutes with the patient reviewing hospital notes,  test results, labs and examining the patient as well as establishing an assessment and plan.  > 50% of time  was spent in direct patient care.    Lindie Spruce Epilepsy [facility] For questions after 5pm please refer to AMION to reach the Neurologist on call

## 2020-07-13 NOTE — Plan of Care (Signed)
  Problem: Nutrition: Goal: Risk of aspiration will decrease Outcome: Progressing Goal: Dietary intake will improve Outcome: Progressing   Problem: Clinical Measurements: Goal: Ability to maintain clinical measurements within normal limits will improve Outcome: Progressing Goal: Will remain free from infection Outcome: Progressing Goal: Cardiovascular complication will be avoided Outcome: Progressing   Problem: Nutrition: Goal: Adequate nutrition will be maintained Outcome: Progressing   Problem: Coping: Goal: Level of anxiety will decrease Outcome: Progressing   Problem: Elimination: Goal: Will not experience complications related to bowel motility Outcome: Progressing Goal: Will not experience complications related to urinary retention Outcome: Progressing   Problem: Pain Managment: Goal: General experience of comfort will improve Outcome: Progressing   Problem: Safety: Goal: Ability to remain free from injury will improve Outcome: Progressing   Problem: Skin Integrity: Goal: Risk for impaired skin integrity will decrease Outcome: Progressing

## 2020-07-14 LAB — GLUCOSE, CAPILLARY
Glucose-Capillary: 102 mg/dL — ABNORMAL HIGH (ref 70–99)
Glucose-Capillary: 117 mg/dL — ABNORMAL HIGH (ref 70–99)
Glucose-Capillary: 99 mg/dL (ref 70–99)
Glucose-Capillary: 105 mg/dL — ABNORMAL HIGH (ref 70–99)
Glucose-Capillary: 92 mg/dL (ref 70–99)
Glucose-Capillary: 108 mg/dL — ABNORMAL HIGH (ref 70–99)

## 2020-07-14 MED ORDER — FREE WATER
200.0000 mL | Status: DC
  Administered 2020-07-14 – 2020-07-16 (×11): 200 mL

## 2020-07-14 MED ORDER — CHLORHEXIDINE GLUCONATE CLOTH 2 % EX PADS
6.0000 | MEDICATED_PAD | Freq: Every day | CUTANEOUS | Status: DC
  Administered 2020-07-15 – 2020-09-10 (×55): 6 via TOPICAL

## 2020-07-14 NOTE — Progress Notes (Signed)
Subjective: No acute events overnight.  ROS: Unable to obtain due to poor mental status  Examination  Vital signs in last 24 hours: Temp:  [98.3 F (36.8 C)-99.9 F (37.7 C)] 98.3 F (36.8 C) (02/01 1142) Pulse Rate:  [72-102] 91 (02/01 1200) Resp:  [15-40] 40 (02/01 1200) BP: (102-137)/(65-83) 128/83 (02/01 1200) SpO2:  [95 %-99 %] 97 % (02/01 1200) FiO2 (%):  [30 %-35 %] 35 % (02/01 1044) Weight:  [99.4 kg] 99.4 kg (02/01 0331)  General: lying in bed,not in apparent distress CVS: pulse-normal rate and rhythm RS: breathing comfortably, +tracheostomy Extremities: normal  Neuro:Opens eyes to taxtile stimuli but did not follow commands, PERRLA, left gaze deviation, winces but difficult to overcome gaze,  antigravity strength in left upper and left lower extremity,withdraws to noxious stimuli in right lower extremity, no movement in right upper extremity.  Basic Metabolic Panel: Recent Labs  Lab 07/09/20 0412 07/10/20 0818 07/13/20 0335  NA 141 143 138  K 4.0 4.2 4.2  CL 100 102 99  CO2 32 31 30  GLUCOSE 89 121* 111*  BUN 32* 27* 28*  CREATININE 0.84 0.81 0.80  CALCIUM 10.8* 10.8* 10.7*    CBC: Recent Labs  Lab 07/09/20 0412 07/10/20 0818 07/11/20 0426 07/12/20 0522 07/13/20 0335  WBC 7.9 6.8 6.9 5.5 6.5  NEUTROABS 5.0 4.2 4.1 3.1 3.8  HGB 9.8* 9.8* 10.0* 9.8* 9.9*  HCT 33.0* 31.4* 32.4* 32.8* 31.9*  MCV 92.4 91.0 91.0 92.9 91.7  PLT 377 356 461* 257 231     Coagulation Studies: No results for input(s): LABPROT, INR in the last 72 hours.  Imaging No new brain imaging overnight  ASSESSMENT/PLAN:60 year old female with left MCA aneurysmal rupture status post coiling now noted to have seizures.  Left MCA aneurysm status post coiling Subarachnoid hemorrhage Seizures Acute encephalopathy - Unclear if episodes of forced gaze deviation are epileptic or not. No eeg changes on scalp eeg during these episodes, mostly noted when patient is awake and  patient able to wince and move other left extremities raises suspicion that these are due to underlying injury rather than seizure. Could be due to stimulation induced due to cortical irritability from underlying injury  Recommendations: -Continue Keppra 1500 mg twice daily, Depakote 750 mg every 8 hours, Vimpat 50 mg twice daily - Unless seizures appear to be prolonged ( Over 15 mins) or spreading to rest of body, will hold off on increasing AEDs as there hasnt been much change in mental status after increasing vpa and adding vimpat  -Continue seizure precautions -As needed IV Ativan for clinical seizure-like activity last more than 5 minutes -Management of rest of comorbidities per primary team  Neurology will sign off. Please call us for any further questions.  I have spent a total of35 minuteswith the patient reviewing hospitalnotes,  test results, labs and examining the patient as well as establishing an assessment and plan.>50% of time was spent in direct patient care.  Lindie Spruce Epilepsy [facility] For questions after 5pm please refer to AMION to reach the Neurologist on call

## 2020-07-14 NOTE — Progress Notes (Signed)
Neurosurgery Service Progress Note  Subjective: No acute events overnight  Objective: Vitals:   07/14/20 0500 07/14/20 0600 07/14/20 0737 07/14/20 0800  BP: 119/74 102/65    Pulse: 85 80 72   Resp: 16 16 16    Temp:    99 F (37.2 C)  TempSrc:    Axillary  SpO2: 98% 98% 99%   Weight:      Height:        Physical Exam: Eyes closed, forced closed by pt when I tried to open them, no rhythmic movements, occasionally moves LUE spontaneously, gaze neutral   Assessment & Plan: 60 y.o. woman w/ ruptured MCA and vasospasm s/p hemicraniectomy, stable exam.   -goal BP normotensive -will require LTACH vs SNF placement, she's neurologically stable for transfer to LTACH vs continued weaning -CCM recs  66  07/14/20 10:03 AM

## 2020-07-14 NOTE — Progress Notes (Signed)
Per MD, this RT placed pt on trach collar. Pt tolerated well. RT will continue to monitor pt.

## 2020-07-14 NOTE — Progress Notes (Signed)
NAME:  Denise Savage, MRN:  [id number], DOB:  [birth year], LOS: 28 ADMISSION DATE:  06/15/2020, CONSULTATION DATE:  06/16/20 REFERRING MD:  Dr. Maurice Small, CHIEF COMPLAINT:  VDRF   Brief History:  60 year old female presented headache x 5 days found to have left sylvian fissure subarachnoid hemorrhage with a left MCA bifurcation aneurysm.  Transferred to [facility] for further care and went for diagnostic angiography with coiling, complicated by intraoperative rupture and subsequent severe vasospasm leading to malignant left MCA stroke.  She returned to the ICU intubated.  Past Medical History:   has a past medical history of Abdominal hernia.  Significant Hospital Events:  1/3 present to [facility] emergency department 1/4 transferred to [facility], NSGY admitting/ taken to neuro IR for coiling.  Remained on vent postop 1/5 more lethargic and unresponsive, stat head CT was done which showed progressive left MCA infarct with brain edema and 6 mm midline shift.  Persistent nonprogressive subarachnoid hemorrhage. 1/6 worsening edema on CT, 3% saline continued 1/7 Keppra increased for subclinical seizures on LTM EEG 1/8 Low-grade febrile 100.9 1/12 modafinil started 1/26 Seizure-like activity >>Given Ativan, LTM EEG hooked up, keprra increased 1/27 seizure activity noted on EEG 1/28 tolerated PSV at 10/5 all day  Consults:  Neuro IR PCCM  Procedures:  1/4 diagnostic angiogram with coiling 1/7 left hemicraniectomy 1/7 R IJ central line >> out 1/19 PEG tube 1/19 tracheostomy  Significant Diagnostic Tests:  1/3 >> CT angiogram head neck > small volume acute subarachnoid hemorrhage centered at the base of the left sylvian fissure.  6 x 6 x 9 mm irregular aneurysm arising from the left MCA bifurcation.  1/5 CTH >> Diffused and partially reabsorbed subarachnoid hemorrhage since yesterday. 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  1/6 CTH >> Progressive swelling of the left MCA  infarct with new midline shift measuring 6 mm. Nonprogressive subarachnoid hemorrhage. No evidence of new territory infarct  1/6 CTH  >> increasing midline shift 8 mm  1/6 >> LTM EEG evidence of epileptogenicity arising from right parietal region , no seizures  1/8 LTM EEG >> evidence ofcortical dysfunctionand epileptogenicity in left frontal region consistent with underlying craniotomy andstructural abnormality. After propofol was stopped, eeg showed periodic epileptiform discharges at 1-1.5hz  In left frontal region which were intermittent and rhythmic which is on the ictal-interictal continuum , worse compared to 1/7  1/9 LTM EEG similar to 1/8  1/10 echocardiogram>>EF 60-65%. No valvular abnormalities. Greater than 50% respiratory variability of IVC EEG 1/21 PL EDS left frontal region  Micro Data:  1/3 SARS/ flu >>neg 1/4 MRSA  >> neg 1/8 resp >> ng  Antimicrobials:  Unasyn 1/8>>1/12 Unasyn 1/20>>>1/25  Interim History / Subjective:  No more seizures on EEG, it was discontinued She has been tolerating pressure support trial, will place her on trach collar and closely monitor for respiratory distress  Objective   Blood pressure 102/65, pulse 72, temperature 99 F (37.2 C), temperature source Axillary, resp. rate 16, height 5\' 4"  (1.626 m), weight 99.4 kg, SpO2 99 %.    Vent Mode: PSV;CPAP FiO2 (%):  [30 %] 30 % Set Rate:  [16 bmp] 16 bmp Vt Set:  [430 mL] 430 mL PEEP:  [5 cmH20] 5 cmH20 Pressure Support:  [10 cmH20] 10 cmH20 Plateau Pressure:  [17 cmH20-19 cmH20] 17 cmH20   Intake/Output Summary (Last 24 hours) at 07/14/2020 1036 Last data filed at 07/14/2020 0600 Gross per 24 hour  Intake 1456.58 ml  Output 1200 ml  Net 256.58 ml   Filed Weights   07/12/20 0500 07/13/20 0500 07/14/20 0331  Weight: 99.8 kg 99 kg 99.4 kg   Physical Exam: Gen: Middle-aged African-American female, lying in the bed s/p trach HEENT:  Leftward eye deviation, sclera anicteric, left  craniotomy, moist mucous membranes Neck:  Trach in place.  CV: Regular rate and rhythm; no murmurs Resp: course breath sounds, no wheezing Abd: + bowel sounds; soft, no distension, PEG in place Ext: No edema, warm Skin: no rash Neuro: Eyes closed, does not follow commands.  Resolved Hospital Problem list   Aspiration pneumonia--received 5d course of unasyn 1/8-1/12, again 1/20-1/25  Assessment & Plan:   Aneurysmal subarachnoid hemorrhage due to ruptured left MCA aneurysm s/p coiling.  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 Continue vimpat  Neurology and Neurosurgery following Ativan as needed for seizure Continue modafinil  Chronic respiratory failure with hypoxia require mechanical ventilation Status post tracheostomy Continue trach collar trials as able Plan for LTAC vs vent SNF once seizures adequately controlled  Dysphagia status post PEG tube  Continue tube feeds  Best practice (evaluated daily)  Diet: tube feeds Pain/Anxiety/Delirium protocol (if indicated): n/a VAP protocol (if indicated): Per protocol DVT prophylaxis: SCDs only  GI prophylaxis: PPI Glucose control: None needed. Continue to monitor Mobility: As tolerated Disposition: ICU Family: per NS  Goals of Care:  Last date of multidisciplinary goals of care discussion: 1/25 Family and staff present: both daughters, [name] and [place], PCCM Summary of discussion: Continue full aggressive care Follow up goals of care discussion due: daughters wish to pursue full scope of care   Code Status: Full   Labs   CBC: Recent Labs  Lab 07/09/20 0412 07/10/20 0818 07/11/20 0426 07/12/20 0522 07/13/20 0335  WBC 7.9 6.8 6.9 5.5 6.5   NEUTROABS 5.0 4.2 4.1 3.1 3.8  HGB 9.8* 9.8* 10.0* 9.8* 9.9*  HCT 33.0* 31.4* 32.4* 32.8* 31.9*  MCV 92.4 91.0 91.0 92.9 91.7  PLT 377 356 461* 257 231    Basic Metabolic Panel: Recent Labs  Lab 07/09/20 0412 07/10/20 0818 07/13/20 0335  NA 141 143 138  K 4.0 4.2 4.2  CL 100 102 99  CO2 32 31 30  GLUCOSE 89 121* 111*  BUN 32* 27* 28*  CREATININE 0.84 0.81 0.80  CALCIUM 10.8* 10.8* 10.7*   GFR: Estimated Creatinine Clearance: 85.7 mL/min (by C-G formula based on SCr of 0.8 mg/dL). Recent Labs  Lab 07/10/20 0818 07/11/20 0426 07/12/20 0522 07/13/20 0335  WBC 6.8 6.9 5.5 6.5    Liver Function Tests: Recent Labs  Lab 07/11/20 0426  ALBUMIN 2.1*   No results for input(s): LIPASE, AMYLASE in the last 168 hours. Recent Labs  Lab 07/11/20 0959 07/12/20 0522  AMMONIA 61* 42*    ABG    Component Value Date/Time   PHART 7.560 (H) 06/19/2020 1513   PCO2ART 31.6 (L) 06/19/2020 1513   PO2ART 138 (H) 06/19/2020 1513   HCO3 28.5 (H) 06/19/2020 1513   TCO2 29 06/19/2020 1513   ACIDBASEDEF 1.0 06/17/2020 0939   O2SAT 99.0 06/19/2020 1513      CBG: Recent Labs  Lab 07/13/20 1514 07/13/20 1942 07/13/20 2318 07/14/20 0321 07/14/20 0732  GLUCAP 111* 78 123* 102*  117*   Total critical care time: 32 minutes  Performed by: Cheri Fowler   Critical care time was exclusive of separately billable procedures and treating other patients.   Critical care was necessary to treat or prevent imminent or life-threatening deterioration.   Critical care was time spent personally by me on the following activities: development of treatment plan with patient and/or surrogate as well as nursing, discussions with consultants, evaluation of patient's response to treatment, examination of patient, obtaining history from patient or surrogate, ordering and performing treatments and interventions, ordering and review of laboratory studies, ordering and review of radiographic studies,  pulse oximetry and re-evaluation of patient's condition.   Cheri Fowler MD [facility] See Amion for pager If no response to pager, please call [phone] until 7pm After 7pm, Please call E-link [phone]

## 2020-07-14 NOTE — Progress Notes (Signed)
Placed pt back on full ventilator support for night time rest.  Patient has done great weaning all day.  RT will assess in the morning to continue ATC weaning as tolerated.

## 2020-07-14 NOTE — Progress Notes (Signed)
Nutrition Follow-up  DOCUMENTATION CODES:   Obesity unspecified  INTERVENTION:   Tube Feeding via Cortrak tube:  Osmolite 1.2 @ 55 ml/hr (1320 ml) 90 ml ProSource BID  Provides: 1744 kcal, 117 grams protein, 208 grams carbohydrate, and 1070 ml free water.   200 ml free water every 4 hours Total free water: 2270 ml   NUTRITION DIAGNOSIS:   Inadequate oral intake related to inability to eat as evidenced by NPO status. Ongoing.   GOAL:   Patient will meet greater than or equal to 90% of their needs Met with TF.   MONITOR:   TF tolerance  REASON FOR ASSESSMENT:   Ventilator    ASSESSMENT:   Pt with PMH of abdominal hernia admitted with L MCA aneurysm s/p coiling complicated by intraoperative rupture and subsequent severe vasospasm.   Pt discussed during ICU rounds and with RN.  Pt weaning on trach collar today.   1/5 cortrak placed; tip gastric  1/7 hemi-craniectomy 1/19 s/p trach and PEG placement   Patient is currently intubated on ventilator support MV: 8.4 L/min Temp (24hrs), Avg:99.3 F (37.4 C), Min:98.3 F (36.8 C), Max:99.9 F (37.7 C)   Medications and labs reviewed    Diet Order:   Diet Order            Diet NPO time specified  Diet effective midnight                 EDUCATION NEEDS:   No education needs have been identified at this time  Skin:  Skin Assessment: Reviewed RN Assessment  Last BM:  2/1 medium  Height:   Ht Readings from Last 1 Encounters:  06/30/20 $RemoveB'5\' 4"'[id number]$  (1.626 m)    Weight:   Wt Readings from Last 1 Encounters:  07/14/20 99.4 kg    Ideal Body Weight:  54.4 kg  BMI:  Body mass index is 37.61 kg/m.  Estimated Nutritional Needs:   Kcal:  1600-1800  Protein:  105-125 grams  Fluid:  >1.6 L/day  Lockie Pares., RD, LDN, CNSC See AMiON for contact information

## 2020-07-15 ENCOUNTER — Inpatient Hospital Stay (HOSPITAL_COMMUNITY): Payer: Medicaid Other

## 2020-07-15 DIAGNOSIS — A419 Sepsis, unspecified organism: Secondary | ICD-10-CM

## 2020-07-15 LAB — CBC WITH DIFFERENTIAL/PLATELET
Abs Immature Granulocytes: 0.04 10*3/uL (ref 0.00–0.07)
Basophils Absolute: 0 10*3/uL (ref 0.0–0.1)
Basophils Relative: 1 %
Eosinophils Absolute: 0.3 10*3/uL (ref 0.0–0.5)
Eosinophils Relative: 5 %
HCT: 32.6 % — ABNORMAL LOW (ref 36.0–46.0)
Hemoglobin: 10 g/dL — ABNORMAL LOW (ref 12.0–15.0)
Immature Granulocytes: 1 %
Lymphocytes Relative: 19 %
Lymphs Abs: 1 10*3/uL (ref 0.7–4.0)
MCH: 27.9 pg (ref 26.0–34.0)
MCHC: 30.7 g/dL (ref 30.0–36.0)
MCV: 91.1 fL (ref 80.0–100.0)
Monocytes Absolute: 0.6 10*3/uL (ref 0.1–1.0)
Monocytes Relative: 11 %
Neutro Abs: 3.2 10*3/uL (ref 1.7–7.7)
Neutrophils Relative %: 63 %
Platelets: 214 10*3/uL (ref 150–400)
RBC: 3.58 MIL/uL — ABNORMAL LOW (ref 3.87–5.11)
RDW: 17.1 % — ABNORMAL HIGH (ref 11.5–15.5)
WBC: 5 10*3/uL (ref 4.0–10.5)
nRBC: 0 % (ref 0.0–0.2)

## 2020-07-15 LAB — GLUCOSE, CAPILLARY
Glucose-Capillary: 108 mg/dL — ABNORMAL HIGH (ref 70–99)
Glucose-Capillary: 124 mg/dL — ABNORMAL HIGH (ref 70–99)
Glucose-Capillary: 127 mg/dL — ABNORMAL HIGH (ref 70–99)
Glucose-Capillary: 64 mg/dL — ABNORMAL LOW (ref 70–99)
Glucose-Capillary: 67 mg/dL — ABNORMAL LOW (ref 70–99)
Glucose-Capillary: 91 mg/dL (ref 70–99)
Glucose-Capillary: 91 mg/dL (ref 70–99)
Glucose-Capillary: 95 mg/dL (ref 70–99)

## 2020-07-15 MED ORDER — DEXTROSE 50 % IV SOLN
25.0000 mL | Freq: Once | INTRAVENOUS | Status: AC
  Administered 2020-07-15: 25 mL via INTRAVENOUS

## 2020-07-15 MED ORDER — DEXTROSE 50 % IV SOLN: 25.0000 mL | INTRAVENOUS | Status: DC | PRN

## 2020-07-15 MED ORDER — DEXTROSE 50 % IV SOLN
INTRAVENOUS | Status: AC
  Filled 2020-07-15: qty 50

## 2020-07-15 MED ORDER — PIPERACILLIN-TAZOBACTAM 3.375 G IVPB
3.3750 g | Freq: Three times a day (TID) | INTRAVENOUS | Status: DC
  Administered 2020-07-15 – 2020-07-17 (×7): 3.375 g via INTRAVENOUS
  Filled 2020-07-15 (×7): qty 50

## 2020-07-15 MED ORDER — METOPROLOL TARTRATE 5 MG/5ML IV SOLN
5.0000 mg | INTRAVENOUS | Status: AC | PRN
  Administered 2020-07-20 – 2020-07-21 (×2): 5 mg via INTRAVENOUS
  Filled 2020-07-15 (×2): qty 5

## 2020-07-15 MED ORDER — DEXTROSE 50 % IV SOLN
INTRAVENOUS | Status: AC
  Administered 2020-07-15: 25 mL
  Filled 2020-07-15: qty 50

## 2020-07-15 MED ORDER — LACTATED RINGERS IV BOLUS
1000.0000 mL | Freq: Once | INTRAVENOUS | Status: AC
  Administered 2020-07-15: 1000 mL via INTRAVENOUS

## 2020-07-15 NOTE — Progress Notes (Signed)
NAME:  Denise Savage, MRN:  [id number], DOB:  05-Nov-1960, LOS: 29 ADMISSION DATE:  06/15/2020, CONSULTATION DATE:  06/16/20 REFERRING MD:  Dr. Maurice Small, CHIEF COMPLAINT:  VDRF   Brief History:  60 year old female presented headache x 5 days found to have left sylvian fissure subarachnoid hemorrhage with a left MCA bifurcation aneurysm.  Transferred to [facility] for further care and went for diagnostic angiography with coiling, complicated by intraoperative rupture and subsequent severe vasospasm leading to malignant left MCA stroke.  She returned to the ICU intubated.  Past Medical History:   has a past medical history of Abdominal hernia.  Significant Hospital Events:  1/3 present to [facility] emergency department 1/4 transferred to [facility], NSGY admitting/ taken to neuro IR for coiling.  Remained on vent postop 1/5 more lethargic and unresponsive, stat head CT was done which showed progressive left MCA infarct with brain edema and 6 mm midline shift.  Persistent nonprogressive subarachnoid hemorrhage. 1/6 worsening edema on CT, 3% saline continued 1/7 Keppra increased for subclinical seizures on LTM EEG 1/8 Low-grade febrile 100.9 1/12 modafinil started 1/26 Seizure-like activity >>Given Ativan, LTM EEG hooked up, keprra increased 1/27 seizure activity noted on EEG 1/28 tolerated PSV at 10/5 all day  Consults:  Neuro IR PCCM  Procedures:  1/4 diagnostic angiogram with coiling 1/7 left hemicraniectomy 1/7 R IJ central line >> out 1/19 PEG tube 1/19 tracheostomy  Significant Diagnostic Tests:  1/3 >> CT angiogram head neck > small volume acute subarachnoid hemorrhage centered at the base of the left sylvian fissure.  6 x 6 x 9 mm irregular aneurysm arising from the left MCA bifurcation.  1/5 CTH >> Diffused and partially reabsorbed subarachnoid hemorrhage since yesterday. 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  1/6 CTH >> Progressive swelling of the left MCA  infarct with new midline shift measuring 6 mm. Nonprogressive subarachnoid hemorrhage. No evidence of new territory infarct  1/6 CTH  >> increasing midline shift 8 mm  1/6 >> LTM EEG evidence of epileptogenicity arising from right parietal region , no seizures  1/8 LTM EEG >> evidence ofcortical dysfunctionand epileptogenicity in left frontal region consistent with underlying craniotomy andstructural abnormality. After propofol was stopped, eeg showed periodic epileptiform discharges at 1-1.5hz  In left frontal region which were intermittent and rhythmic which is on the ictal-interictal continuum , worse compared to 1/7  1/9 LTM EEG similar to 1/8  1/10 echocardiogram>>EF 60-65%. No valvular abnormalities. Greater than 50% respiratory variability of IVC EEG 1/21 PL EDS left frontal region  Micro Data:  1/3 SARS/ flu >>neg 1/4 MRSA  >> neg 1/8 resp >> ng  Antimicrobials:  Unasyn 1/8>>1/12 Unasyn 1/20>>>1/25  Interim History / Subjective:  Patient had an episode of seizure this morning, received 2 doses of IV Ativan with improvement. She started spiking high-grade fever with T-max 103.8, became tachycardic and tachypneic consistent with sepsis She tolerated trach collar for 12 hours yesterday, unable to tolerate SBT this morning  Objective   Blood pressure 98/63, pulse (!) 132, temperature (!) 103.8 F (39.9 C), temperature source Axillary, resp. rate (!) 23, height 5\' 4"  (1.626 m), weight 99.2 kg, SpO2 92 %.    Vent Mode: PRVC FiO2 (%):  [30 %-35 %] 30 % Set Rate:  [16 bmp] 16 bmp Vt Set:  [430 mL] 430 mL PEEP:  [5 cmH20] 5 cmH20 Plateau Pressure:  [10 cmH20-21 cmH20] 10 cmH20   Intake/Output Summary (Last 24 hours) at 07/15/2020 09/12/2020 Last data filed at  07/15/2020 0900 Gross per 24 hour  Intake 2669.99 ml  Output 1730 ml  Net 939.99 ml   Filed Weights   07/13/20 0500 07/14/20 0331 07/15/20 0500  Weight: 99 kg 99.4 kg 99.2 kg   Physical Exam: Gen: 60 year old African-American female, lying in the bed s/p trach HEENT: Forced left gaze deviation, sclera anicteric, left craniotomy, moist mucous membranes Neck:  Trach in place.  CV: Tachycardic, regular rhythm; no murmurs Resp: Tachypneic, course breath sounds, no wheezing Abd: + bowel sounds; soft, no distension, PEG in place Ext: No edema, warm Skin: no rash Neuro: Eyes closed, does not follow commands.  Resolved Hospital Problem list   Aspiration pneumonia--received 5d course of unasyn 1/8-1/12, again 1/20-1/25  Assessment & Plan:   Aneurysmal subarachnoid hemorrhage due to ruptured left MCA aneurysm s/p coiling.  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 and midline shift s/p hemi-craniectomy 1/7 EEG 1/21 PL EDS left frontal region  She continues to have intermittent seizure-like activity Had an episode of seizure this morning Per neurology adding more meds did not help, unless patient has seizure more than 15 minutes but will not change any antiepileptic regimen Continue Keppra 1500 twice daily, valproate and vimpat  Neurology and Neurosurgery following Ativan as needed for seizure Continue modafinil  Sepsis with unclear source could be aspiration pneumonia versus UTI Patient started spiking fever with T-max 103.8, became tachycardic and tachypneic Unable to tolerate pressure support We will get blood cultures, respiratory culture, urine analysis and x-ray chest Started empirically on IV Zosyn We will give her 1 L of IV fluid with LR  Chronic respiratory failure with hypoxia require mechanical ventilation Status post tracheostomy Patient tolerated trach collar for 12 hours yesterday, unable to tolerate SBT this morning due to sepsis We will try SBT once she is settled Plan for LTAC vs vent SNF once seizures adequately controlled  Dysphagia status post PEG tube  Continue tube feeds  Recurrent hypoglycemia Patient's fingerstick dropped down to 65, requiring D50  bolus She is not on insulin Continue tube feeds and monitor blood sugar  Best practice (evaluated daily)  Diet: tube feeds Pain/Anxiety/Delirium protocol (if indicated): n/a VAP protocol (if indicated): Per protocol DVT prophylaxis: SCDs only  GI prophylaxis: PPI Glucose control: None needed. Continue to monitor Mobility: As tolerated Disposition: ICU Family: per NS  Goals of Care:  Last date of multidisciplinary goals of care discussion: 2/2 Family and staff present: both daughters, [name] and [name], PCCM Summary of discussion: Continue full aggressive care Follow up goals of care discussion due: daughters wish to pursue full scope of care   Code Status: Full   Labs   CBC: Recent Labs  Lab 07/10/20 0818 07/11/20 0426 07/12/20 0522 07/13/20 0335 07/15/20 0210  WBC 6.8 6.9 5.5 6.5 5.0  NEUTROABS 4.2 4.1 3.1 3.8 3.2  HGB 9.8* 10.0* 9.8* 9.9* 10.0*  HCT 31.4* 32.4* 32.8* 31.9* 32.6*  MCV 91.0 91.0 92.9 91.7 91.1  PLT 356 461* 257 231 214    Basic Metabolic Panel: Recent Labs  Lab 07/09/20 0412 07/10/20 0818 07/13/20 0335  NA 141 143 138  K 4.0 4.2 4.2  CL 100 102 99  CO2 32 31 30  GLUCOSE 89 121* 111*  BUN 32* 27* 28*  CREATININE 0.84 0.81 0.80  CALCIUM 10.8* 10.8* 10.7*   GFR: Estimated Creatinine Clearance: 85.6 mL/min (by C-G formula based on SCr of 0.8 mg/dL). Recent Labs  Lab 07/11/20 0426 07/12/20 0522 07/13/20 0335 07/15/20 0210  WBC 6.9 5.5 6.5 5.0    Liver Function Tests: Recent Labs  Lab 07/11/20 0426  ALBUMIN 2.1*   No results for input(s): LIPASE, AMYLASE in the last 168 hours. Recent Labs  Lab 07/11/20 0959 07/12/20 0522  AMMONIA 61* 42*    ABG    Component Value Date/Time   PHART 7.560 (H) 06/19/2020 1513   PCO2ART 31.6 (L) 06/19/2020 1513   PO2ART 138 (H) 06/19/2020 1513   HCO3 28.5 (H) 06/19/2020 1513   TCO2 29 06/19/2020 1513   ACIDBASEDEF 1.0 06/17/2020 0939   O2SAT 99.0 06/19/2020 1513      CBG: Recent Labs   Lab 07/14/20 1922 07/14/20 2306 07/15/20 0318 07/15/20 0721 07/15/20 0756  GLUCAP 92 108* 108* 67* 91   Total critical care time: 38 minutes  Performed by: Cheri Fowler   Critical care time was exclusive of separately billable procedures and treating other patients.   Critical care was necessary to treat or prevent imminent or life-threatening deterioration.   Critical care was time spent personally by me on the following activities: development of treatment plan with patient and/or surrogate as well as nursing, discussions with consultants, evaluation of patient's response to treatment, examination of patient, obtaining history from patient or surrogate, ordering and performing treatments and interventions, ordering and review of laboratory studies, ordering and review of radiographic studies, pulse oximetry and re-evaluation of patient's condition.   Cheri Fowler MD [facility] See Amion for pager If no response to pager, please call [phone] until 7pm After 7pm, Please call E-link [phone]

## 2020-07-15 NOTE — Progress Notes (Signed)
Pharmacy Antibiotic Note  Denise Savage is a 60 y.o. female admitted on 06/15/2020 now with aspiration  pneumonia.  Pharmacy has been consulted for Zosyn dosing. WBC wnl, Tm 103.63F. SCr wnl  Plan: -Zosyn 3.375 gm IV Q8 hours (EI infusion) -Monitor CBC, renal fx, cultures and clinical progress   Height: 5\' 4"  (162.6 cm) Weight: 99.2 kg (218 lb 11.1 oz) IBW/kg (Calculated) : 54.7  Temp (24hrs), Avg:100.5 F (38.1 C), Min:98.3 F (36.8 C), Max:103.8 F (39.9 C)  Recent Labs  Lab 07/09/20 0412 07/10/20 0818 07/11/20 0426 07/12/20 0522 07/13/20 0335 07/15/20 0210  WBC 7.9 6.8 6.9 5.5 6.5 5.0  CREATININE 0.84 0.81  --   --  0.80  --     Estimated Creatinine Clearance: 85.6 mL/min (by C-G formula based on SCr of 0.8 mg/dL).    Allergies  Allergen Reactions  . Hydrocodone Nausea Only    Antimicrobials this admission: Zosyn 2/2 >>   Dose adjustments this admission:   Microbiology results: 2/2 BCx:  2/2 TA:  Thank you for allowing pharmacy to be a part of this patient's care.  09/12/20, PharmD., BCPS, BCCCP Clinical Pharmacist Please refer to [facility] for unit-specific pharmacist

## 2020-07-15 NOTE — Progress Notes (Signed)
   07/15/20 0744  Vitals  Pulse Rate (!) 132  ECG Heart Rate (!) 132  Resp (!) 24  Oxygen Therapy  SpO2 96 %  MEWS Score  MEWS Temp 2  MEWS Systolic 1  MEWS Pulse 3  MEWS RR 1  MEWS LOC 2  MEWS Score 9  MEWS Score Color Red  Provider Notification  Provider Name/Title Dr Prince Solian  Date Provider Notified 07/15/20  Time Provider Notified [phone]  Notification Type Call  Notification Reason Change in status (seizures, elevated temp, hypoglycemic, tachycardia)  Response See new orders  Date of Provider Response 07/15/20  Time of Provider Response [phone]

## 2020-07-15 NOTE — Progress Notes (Signed)
Neurosurgery Service Progress Note  Subjective: No acute events overnight  Objective: Vitals:   07/15/20 1138 07/15/20 1200 07/15/20 1300 07/15/20 1400  BP: (!) 92/47 104/62 (!) 91/57 (!) 89/58  Pulse: (!) 112 (!) 110 (!) 102 95  Resp: (!) 23 (!) 25 (!) 22 17  Temp:  99.2 F (37.3 C)    TempSrc:  Axillary    SpO2: 93% 95% 95% 95%  Weight:      Height:        Physical Exam: Eyes closed, forced closed by pt when I tried to open them, no rhythmic movements, occasionally moves LUE spontaneously, gaze neutral   Assessment & Plan: 60 y.o. woman w/ ruptured MCA and vasospasm s/p hemicraniectomy, stable exam.   -goal BP normotensive -will require LTACH vs SNF placement, she's neurologically stable for transfer to [facility] vs continued weaning -CCM recs  Jadene Pierini  07/15/20 3:06 PM

## 2020-07-15 NOTE — TOC Progression Note (Addendum)
Transition of Care [facility]) - Progression Note    Patient Details  Name: Denise Savage MRN: [id number] Date of Birth: Apr 06, 1961  Transition of Care [facility] CM/SW Contact  Mearl Latin, LCSW Phone Number: 07/15/2020, 8:33 AM  Clinical Narrative:    CSW spoke with Clydie Braun at [facility]. She reported they are reviewing daily for any bed openings. She stated CSW can fax updates (f. [phone]) and she will keep CSW posted on bed availability.  CSW spoke with patient's daughter [name] and provided updates on [facility]. She was unaware that patient could be discharged if she is still on a ventilator. CSW explained that if patient is unable to wean successfully, the MD would assess her for medically stability and patient could require a Vent SNF. She reported hope that patient will be able to wean. She stated she dropped off the Disability application to the [facility] and the Medicaid application (processed on 07/10/20) and was told that once Disability was approved, Medicaid would be approved. CSW will continue to follow.    Expected Discharge Plan: Skilled Nursing Facility Barriers to Discharge: Continued Medical Work up,SNF Pending bed offer  Expected Discharge Plan and Services Expected Discharge Plan: Skilled Nursing Facility     Post Acute Care Choice: Skilled Nursing Facility Living arrangements for the past 2 months: Single Family Home                                       Social Determinants of Health (SDOH) Interventions    Readmission Risk Interventions No flowsheet data found.

## 2020-07-16 LAB — URINALYSIS, ROUTINE W REFLEX MICROSCOPIC
Bacteria, UA: NONE SEEN
Bilirubin Urine: NEGATIVE
Glucose, UA: NEGATIVE mg/dL
Hgb urine dipstick: NEGATIVE
Ketones, ur: NEGATIVE mg/dL
Nitrite: NEGATIVE
Protein, ur: NEGATIVE mg/dL
Specific Gravity, Urine: 1.026 (ref 1.005–1.030)
pH: 5 (ref 5.0–8.0)

## 2020-07-16 LAB — CBC WITH DIFFERENTIAL/PLATELET
Abs Immature Granulocytes: 0.26 10*3/uL — ABNORMAL HIGH (ref 0.00–0.07)
Basophils Absolute: 0.1 10*3/uL (ref 0.0–0.1)
Basophils Relative: 1 %
Eosinophils Absolute: 0.2 10*3/uL (ref 0.0–0.5)
Eosinophils Relative: 2 %
HCT: 30.9 % — ABNORMAL LOW (ref 36.0–46.0)
Hemoglobin: 10 g/dL — ABNORMAL LOW (ref 12.0–15.0)
Immature Granulocytes: 2 %
Lymphocytes Relative: 18 %
Lymphs Abs: 2 10*3/uL (ref 0.7–4.0)
MCH: 28.5 pg (ref 26.0–34.0)
MCHC: 32.4 g/dL (ref 30.0–36.0)
MCV: 88 fL (ref 80.0–100.0)
Monocytes Absolute: 1.3 10*3/uL — ABNORMAL HIGH (ref 0.1–1.0)
Monocytes Relative: 11 %
Neutro Abs: 7.4 10*3/uL (ref 1.7–7.7)
Neutrophils Relative %: 66 %
Platelets: 165 10*3/uL (ref 150–400)
RBC: 3.51 MIL/uL — ABNORMAL LOW (ref 3.87–5.11)
RDW: 17.1 % — ABNORMAL HIGH (ref 11.5–15.5)
WBC: 11.1 10*3/uL — ABNORMAL HIGH (ref 4.0–10.5)
nRBC: 0 % (ref 0.0–0.2)

## 2020-07-16 LAB — BASIC METABOLIC PANEL
Anion gap: 8 (ref 5–15)
BUN: 33 mg/dL — ABNORMAL HIGH (ref 6–20)
CO2: 27 mmol/L (ref 22–32)
Calcium: 10.5 mg/dL — ABNORMAL HIGH (ref 8.9–10.3)
Chloride: 99 mmol/L (ref 98–111)
Creatinine, Ser: 0.82 mg/dL (ref 0.44–1.00)
GFR, Estimated: >60 mL/min (ref >60)
Glucose, Bld: 91 mg/dL (ref 70–99)
Potassium: 5.7 mmol/L — ABNORMAL HIGH (ref 3.5–5.1)
Sodium: 134 mmol/L — ABNORMAL LOW (ref 135–145)

## 2020-07-16 LAB — GLUCOSE, CAPILLARY
Glucose-Capillary: 84 mg/dL (ref 70–99)
Glucose-Capillary: 81 mg/dL (ref 70–99)
Glucose-Capillary: 73 mg/dL (ref 70–99)
Glucose-Capillary: 94 mg/dL (ref 70–99)
Glucose-Capillary: 81 mg/dL (ref 70–99)
Glucose-Capillary: 95 mg/dL (ref 70–99)

## 2020-07-16 LAB — PHOSPHORUS: Phosphorus: 3.7 mg/dL (ref 2.5–4.6)

## 2020-07-16 LAB — MAGNESIUM: Magnesium: 2 mg/dL (ref 1.7–2.4)

## 2020-07-16 MED ORDER — FREE WATER
200.0000 mL | Freq: Four times a day (QID) | Status: DC
  Administered 2020-07-16 – 2020-09-10 (×222): 200 mL

## 2020-07-16 NOTE — Progress Notes (Signed)
NAME:  Denise Savage, MRN:  [id number], DOB:  01-30-1961, LOS: 30 ADMISSION DATE:  06/15/2020, CONSULTATION DATE:  06/16/20 REFERRING MD:  Dr. Maurice Small, CHIEF COMPLAINT:  VDRF   Brief History:  60 year old female presented headache x 5 days found to have left sylvian fissure subarachnoid hemorrhage with a left MCA bifurcation aneurysm.  Transferred to [facility] for further care and went for diagnostic angiography with coiling, complicated by intraoperative rupture and subsequent severe vasospasm leading to malignant left MCA stroke.  She returned to the ICU intubated.  Past Medical History:   has a past medical history of Abdominal hernia.  Significant Hospital Events:  1/3 present to [facility] [place] emergency department 1/4 transferred to [facility], NSGY admitting/ taken to neuro IR for coiling.  Remained on vent postop 1/5 more lethargic and unresponsive, stat head CT was done which showed progressive left MCA infarct with brain edema and 6 mm midline shift.  Persistent nonprogressive subarachnoid hemorrhage. 1/6 worsening edema on CT, 3% saline continued 1/7 Keppra increased for subclinical seizures on LTM EEG 1/8 Low-grade febrile 100.9 1/12 modafinil started 1/26 Seizure-like activity >>Given Ativan, LTM EEG hooked up, keprra increased 1/27 seizure activity noted on EEG 1/28 tolerated PSV at 10/5 all day  Consults:  Neuro IR PCCM  Procedures:  1/4 diagnostic angiogram with coiling 1/7 left hemicraniectomy 1/7 R IJ central line >> out 1/19 PEG tube 1/19 tracheostomy  Significant Diagnostic Tests:  1/3 >> CT angiogram head neck > small volume acute subarachnoid hemorrhage centered at the base of the left sylvian fissure.  6 x 6 x 9 mm irregular aneurysm arising from the left MCA bifurcation.  1/5 CTH >> Diffused and partially reabsorbed subarachnoid hemorrhage since yesterday. 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  1/6 CTH >> Progressive swelling of the left MCA  infarct with new midline shift measuring 6 mm. Nonprogressive subarachnoid hemorrhage. No evidence of new territory infarct  1/6 CTH  >> increasing midline shift 8 mm  1/6 >> LTM EEG evidence of epileptogenicity arising from right parietal region , no seizures  1/8 LTM EEG >> evidence ofcortical dysfunctionand epileptogenicity in left frontal region consistent with underlying craniotomy andstructural abnormality. After propofol was stopped, eeg showed periodic epileptiform discharges at 1-1.5hz  In left frontal region which were intermittent and rhythmic which is on the ictal-interictal continuum , worse compared to 1/7  1/9 LTM EEG similar to 1/8  1/10 echocardiogram>>EF 60-65%. No valvular abnormalities. Greater than 50% respiratory variability of IVC EEG 1/21 PL EDS left frontal region  Micro Data:  1/3 SARS/ flu >>neg 1/4 MRSA  >> neg 1/8 resp >> ng 2/2 respiratory culture: Gram-negative rods 2/2 blood cultures  >>  Antimicrobials:  Unasyn 1/8>>1/12 Unasyn 1/20>>>1/25 Zosyn 2/2 >>  Interim History / Subjective:  Patient is now afebrile, no more seizure episodes Tachycardia and tachypnea improved.  Respiratory cultures growing gram-negative rods   Objective   Blood pressure 93/68, pulse 85, temperature 99.1 F (37.3 C), temperature source Axillary, resp. rate (!) 29, height 5\' 4"  (1.626 m), weight 99 kg, SpO2 100 %.    Vent Mode: PRVC FiO2 (%):  [30 %] 30 % Set Rate:  [16 bmp] 16 bmp Vt Set:  [430 mL] 430 mL PEEP:  [5 cmH20] 5 cmH20 Plateau Pressure:  [15 cmH20-17 cmH20] 15 cmH20   Intake/Output Summary (Last 24 hours) at 07/16/2020 0753 Last data filed at 07/16/2020 0600 Gross per 24 hour  Intake 2468.06 ml  Output 180 ml  Net  2288.06 ml   Filed Weights   07/14/20 0331 07/15/20 0500 07/16/20 0500  Weight: 99.4 kg 99.2 kg 99 kg   Physical Exam: Gen: Middle-aged African-American female, lying in the bed s/p trach HEENT: Left gaze deviation, able to correct to  midline, sclera anicteric, left craniotomy, moist mucous membranes Neck:  Trach in place.  CV: T regular rate and rhythm; no murmurs Resp: Tachypneic, course breath sounds, no wheezing Abd: + bowel sounds; soft, no distension, PEG in place Ext: No edema, warm Skin: no rash Neuro: Eyes closed, does not follow commands.  Resolved Hospital Problem list     Assessment & Plan:   Aneurysmal subarachnoid hemorrhage due to ruptured left MCA aneurysm s/p coiling.  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 and midline shift s/p hemi-craniectomy 1/7 EEG 1/21 PL EDS left frontal region  She continues to have intermittent seizure-like activity Per neurology adding more meds did not help, unless patient has seizure more than 15 minutes but will not change any antiepileptic regimen Continue Keppra 1500 twice daily, valproate and vimpat  Neurology and Neurosurgery following Ativan as needed for seizure Continue modafinil  Sepsis due to aspiration pneumonia/HCAP with gram-negative rods Patient became afebrile, tachycardia and tachypnea has improved Follow-up respiratory culture for specification and sensitivities Blood cultures are pending UA is negative for UTI Continue Zosyn  Chronic respiratory failure with hypoxia require mechanical ventilation Status post tracheostomy Placed on pressure support trial, able to tolerate so far, if she does well in 1 hour we will switch her to trach collar Plan for LTAC vs vent SNF once seizures adequately controlled  Dysphagia status post PEG tube  Continue tube feeds  Recurrent hypoglycemia Patient's blood sugars are better now She is not on insulin Continue tube feeds and monitor blood sugar  Best practice (evaluated daily)  Diet: tube feeds Pain/Anxiety/Delirium protocol (if indicated): n/a VAP protocol (if indicated): Per protocol DVT prophylaxis: SCDs only  GI prophylaxis: PPI Glucose control: None needed. Continue to  monitor Mobility: As tolerated Disposition: ICU Family: per NS  Goals of Care:  Last date of multidisciplinary goals of care discussion: 2/2 Family and staff present: both daughters, [name] and [name], PCCM Summary of discussion: Continue full aggressive care Follow up goals of care discussion due: daughters wish to pursue full scope of care   Code Status: Full   Labs   CBC: Recent Labs  Lab 07/11/20 0426 07/12/20 0522 07/13/20 0335 07/15/20 0210 07/16/20 0457  WBC 6.9 5.5 6.5 5.0 11.1*  NEUTROABS 4.1 3.1 3.8 3.2 7.4  HGB 10.0* 9.8* 9.9* 10.0* 10.0*  HCT 32.4* 32.8* 31.9* 32.6* 30.9*  MCV 91.0 92.9 91.7 91.1 88.0  PLT 461* 257 231 214 165    Basic Metabolic Panel: Recent Labs  Lab 07/10/20 0818 07/13/20 0335 07/16/20 0457  NA 143 138 134*  K 4.2 4.2 5.7*  CL 102 99 99  CO2 31 30 27   GLUCOSE 121* 111* 91  BUN 27* 28* 33*  CREATININE 0.81 0.80 0.82  CALCIUM 10.8* 10.7* 10.5*  MG  --   --  2.0  PHOS  --   --  3.7   GFR: Estimated Creatinine Clearance: 83.4 mL/min (by C-G formula based on SCr of 0.82 mg/dL). Recent Labs  Lab 07/12/20 0522 07/13/20 0335 07/15/20 0210 07/16/20 0457  WBC 5.5 6.5 5.0 11.1*    Liver Function Tests: Recent Labs  Lab 07/11/20 0426  ALBUMIN 2.1*   No results for input(s): LIPASE, AMYLASE in the last  168 hours. Recent Labs  Lab 07/11/20 0959 07/12/20 0522  AMMONIA 61* 42*    ABG    Component Value Date/Time   PHART 7.560 (H) 06/19/2020 1513   PCO2ART 31.6 (L) 06/19/2020 1513   PO2ART 138 (H) 06/19/2020 1513   HCO3 28.5 (H) 06/19/2020 1513   TCO2 29 06/19/2020 1513   ACIDBASEDEF 1.0 06/17/2020 0939   O2SAT 99.0 06/19/2020 1513      CBG: Recent Labs  Lab 07/15/20 1532 07/15/20 1939 07/15/20 2000 07/15/20 2332 07/16/20 0318  GLUCAP 124* 64* 95 91 84   Total critical care time: 35 minutes  Performed by: Cheri Fowler   Critical care time was exclusive of separately billable procedures and treating other  patients.   Critical care was necessary to treat or prevent imminent or life-threatening deterioration.   Critical care was time spent personally by me on the following activities: development of treatment plan with patient and/or surrogate as well as nursing, discussions with consultants, evaluation of patient's response to treatment, examination of patient, obtaining history from patient or surrogate, ordering and performing treatments and interventions, ordering and review of laboratory studies, ordering and review of radiographic studies, pulse oximetry and re-evaluation of patient's condition.   Cheri Fowler MD [facility] See Amion for pager If no response to pager, please call [phone] until 7pm After 7pm, Please call E-link [phone]

## 2020-07-16 NOTE — Progress Notes (Addendum)
  NEUROSURGERY PROGRESS NOTE   No issues overnight.  EXAM:  BP 93/68   Pulse 85   Temp 99.1 F (37.3 C) (Axillary)   Resp (!) 29   Ht 5\' 4"  (1.626 m)   Wt 99 kg   SpO2 100%   BMI 37.46 kg/m   Trach Eyes closed Pupils reactive, gaze neutral Purposeful LUE  IMPRESSION/PLAN 60 y.o. female s/p  ruptured MCA and vasospasm s/p hemicraniectomy. Stable. Sepsis - HCAP - continue supportive care - Sepsis secondary to HCAP - per CCM - Trach per CCM - Seizures - per neuro

## 2020-07-17 LAB — CBC WITH DIFFERENTIAL/PLATELET
Abs Immature Granulocytes: 0.02 10*3/uL (ref 0.00–0.07)
Basophils Absolute: 0 10*3/uL (ref 0.0–0.1)
Basophils Relative: 1 %
Eosinophils Absolute: 0.3 10*3/uL (ref 0.0–0.5)
Eosinophils Relative: 6 %
HCT: 29.3 % — ABNORMAL LOW (ref 36.0–46.0)
Hemoglobin: 9.6 g/dL — ABNORMAL LOW (ref 12.0–15.0)
Immature Granulocytes: 1 %
Lymphocytes Relative: 21 %
Lymphs Abs: 0.9 10*3/uL (ref 0.7–4.0)
MCH: 29.9 pg (ref 26.0–34.0)
MCHC: 32.8 g/dL (ref 30.0–36.0)
MCV: 91.3 fL (ref 80.0–100.0)
Monocytes Absolute: 0.6 10*3/uL (ref 0.1–1.0)
Monocytes Relative: 13 %
Neutro Abs: 2.6 10*3/uL (ref 1.7–7.7)
Neutrophils Relative %: 58 %
Platelets: 136 10*3/uL — ABNORMAL LOW (ref 150–400)
RBC: 3.21 MIL/uL — ABNORMAL LOW (ref 3.87–5.11)
RDW: 17.2 % — ABNORMAL HIGH (ref 11.5–15.5)
WBC: 4.4 10*3/uL (ref 4.0–10.5)
nRBC: 0.5 % — ABNORMAL HIGH (ref 0.0–0.2)

## 2020-07-17 LAB — CULTURE, RESPIRATORY W GRAM STAIN

## 2020-07-17 LAB — GLUCOSE, CAPILLARY
Glucose-Capillary: 80 mg/dL (ref 70–99)
Glucose-Capillary: 78 mg/dL (ref 70–99)
Glucose-Capillary: 89 mg/dL (ref 70–99)
Glucose-Capillary: 92 mg/dL (ref 70–99)
Glucose-Capillary: 82 mg/dL (ref 70–99)
Glucose-Capillary: 85 mg/dL (ref 70–99)

## 2020-07-17 MED ORDER — SODIUM CHLORIDE 0.9 % IV SOLN
2.0000 g | INTRAVENOUS | Status: AC
  Administered 2020-07-17 – 2020-07-21 (×5): 2 g via INTRAVENOUS
  Filled 2020-07-17: qty 0.1
  Filled 2020-07-17: qty 2
  Filled 2020-07-17 (×2): qty 20
  Filled 2020-07-17: qty 2
  Filled 2020-07-17: qty 20

## 2020-07-17 NOTE — Progress Notes (Signed)
Pt placed on 40% ATC per MD request. RN aware.

## 2020-07-17 NOTE — Progress Notes (Signed)
NAME:  Denise Savage, MRN:  [id number], DOB:  [birth year], LOS: 31 ADMISSION DATE:  06/15/2020, CONSULTATION DATE:  06/16/20 REFERRING MD:  Dr. Maurice Small, CHIEF COMPLAINT:  VDRF   Brief History:  60 year old female presented headache x 5 days found to have left sylvian fissure subarachnoid hemorrhage with a left MCA bifurcation aneurysm.  Transferred to [facility] for further care and went for diagnostic angiography with coiling, complicated by intraoperative rupture and subsequent severe vasospasm leading to malignant left MCA stroke.  She returned to the ICU intubated.  Past Medical History:   has a past medical history of Abdominal hernia.  Significant Hospital Events:  1/3 present to [facility] emergency department 1/4 transferred to [facility], NSGY admitting/ taken to neuro IR for coiling.  Remained on vent postop 1/5 more lethargic and unresponsive, stat head CT was done which showed progressive left MCA infarct with brain edema and 6 mm midline shift.  Persistent nonprogressive subarachnoid hemorrhage. 1/6 worsening edema on CT, 3% saline continued 1/7 Keppra increased for subclinical seizures on LTM EEG 1/8 Low-grade febrile 100.9 1/12 modafinil started 1/26 Seizure-like activity >>Given Ativan, LTM EEG hooked up, keprra increased 1/27 seizure activity noted on EEG 1/28 tolerated PSV at 10/5 all day  Consults:  Neuro IR PCCM  Procedures:  1/4 diagnostic angiogram with coiling 1/7 left hemicraniectomy 1/7 R IJ central line >> out 1/19 PEG tube 1/19 tracheostomy  Significant Diagnostic Tests:  1/3 >> CT angiogram head neck > small volume acute subarachnoid hemorrhage centered at the base of the left sylvian fissure.  6 x 6 x 9 mm irregular aneurysm arising from the left MCA bifurcation.  1/5 CTH >> Diffused and partially reabsorbed subarachnoid hemorrhage since yesterday. 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  1/6 CTH >> Progressive swelling of the left MCA  infarct with new midline shift measuring 6 mm. Nonprogressive subarachnoid hemorrhage. No evidence of new territory infarct  1/6 CTH  >> increasing midline shift 8 mm  1/6 >> LTM EEG evidence of epileptogenicity arising from right parietal region , no seizures  1/8 LTM EEG >> evidence ofcortical dysfunctionand epileptogenicity in left frontal region consistent with underlying craniotomy andstructural abnormality. After propofol was stopped, eeg showed periodic epileptiform discharges at 1-1.5hz  In left frontal region which were intermittent and rhythmic which is on the ictal-interictal continuum , worse compared to 1/7  1/9 LTM EEG similar to 1/8  1/10 echocardiogram>>EF 60-65%. No valvular abnormalities. Greater than 50% respiratory variability of IVC EEG 1/21 PL EDS left frontal region  Micro Data:  1/3 SARS/ flu >>neg 1/4 MRSA  >> neg 1/8 resp >> ng 2/2 respiratory culture: Gram-negative rods 2/2 blood cultures  >> negative  Antimicrobials:  Unasyn 1/8>>1/12 Unasyn 1/20>>>1/25 Zosyn 2/2 >>  Interim History / Subjective:  Patient remained afebrile and tolerated pressure support trial for 2 hours this morning We will place her on trach collar, watch for respiratory distress  Objective   Blood pressure 96/63, pulse 84, temperature 99.5 F (37.5 C), temperature source Axillary, resp. rate (!) 27, height 5\' 4"  (1.626 m), weight 99 kg, SpO2 94 %.    Vent Mode: PSV;CPAP FiO2 (%):  [30 %] 30 % Set Rate:  [16 bmp] 16 bmp Vt Set:  [430 mL] 430 mL PEEP:  [5 cmH20] 5 cmH20 Pressure Support:  [8 cmH20] 8 cmH20 Plateau Pressure:  [15 cmH20] 15 cmH20   Intake/Output Summary (Last 24 hours) at 07/17/2020 1017 Last data filed at 07/17/2020 0647 Gross per  24 hour  Intake 2231.7 ml  Output 1750 ml  Net 481.7 ml   Filed Weights   07/15/20 0500 07/16/20 0500 07/17/20 0500  Weight: 99.2 kg 99 kg 99 kg   Physical Exam: Gen: Middle-aged African-American female, lying in the bed s/p  trach HEENT: Left gaze deviation, able to correct to midline, sclera anicteric, left craniotomy, moist mucous membranes Neck:  Trach in place, no JVD CV:  regular rate and rhythm; no murmurs Resp: Tachypneic, course breath sounds, no wheezing Abd: + bowel sounds; soft, no distension, PEG in place Ext: No edema, warm Skin: no rash Neuro: Eyes closed, does not follow commands.  Resolved Hospital Problem list     Assessment & Plan:   Aneurysmal subarachnoid hemorrhage due to ruptured left MCA aneurysm s/p coiling.  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 and midline shift s/p hemi-craniectomy 1/7  No more seizure in last 48 hours Will not change any antiseizure medication unless patient is having seizure for more than 15 minutes Continue Keppra 1500 twice daily, valproate and vimpat  Neurology and Neurosurgery following Ativan as needed for seizure Continue modafinil  Sepsis due to aspiration pneumonia/HCAP with gram-negative rods Sepsis is improved Respiratory culture growing gram-negative rods, still sensitivities pending Blood cultures are negative Continue Zosyn day 3  Chronic respiratory failure with hypoxia require mechanical ventilation Status post tracheostomy Patient tolerated pressure support trial for more than 2 hours today, will place on trach collar We will watch for respiratory distress Plan for LTAC vs vent SNF  Dysphagia status post PEG tube  Continue tube feeds  Recurrent hypoglycemia Patient's blood sugars are better now She is not on insulin Continue tube feeds and monitor blood sugar  Best practice (evaluated daily)  Diet: tube feeds Pain/Anxiety/Delirium protocol (if indicated): n/a VAP protocol (if indicated): Per protocol DVT prophylaxis: SCDs only  GI prophylaxis: PPI Glucose control: None needed. Continue to monitor Mobility: As tolerated Disposition: ICU Family: per NS  Goals of Care:  Last date of multidisciplinary goals  of care discussion: 2/2 Family and staff present: both daughters, [name] and Belen, PCCM Summary of discussion: Continue full aggressive care Follow up goals of care discussion due: daughters wish to pursue full scope of care   Code Status: Full   Labs   CBC: Recent Labs  Lab 07/12/20 0522 07/13/20 0335 07/15/20 0210 07/16/20 0457 07/17/20 0056  WBC 5.5 6.5 5.0 11.1* 4.4  NEUTROABS 3.1 3.8 3.2 7.4 2.6  HGB 9.8* 9.9* 10.0* 10.0* 9.6*  HCT 32.8* 31.9* 32.6* 30.9* 29.3*  MCV 92.9 91.7 91.1 88.0 91.3  PLT 257 231 214 165 136*    Basic Metabolic Panel: Recent Labs  Lab 07/13/20 0335 07/16/20 0457  NA 138 134*  K 4.2 5.7*  CL 99 99  CO2 30 27  GLUCOSE 111* 91  BUN 28* 33*  CREATININE 0.80 0.82  CALCIUM 10.7* 10.5*  MG  --  2.0  PHOS  --  3.7   GFR: Estimated Creatinine Clearance: 83.4 mL/min (by C-G formula based on SCr of 0.82 mg/dL). Recent Labs  Lab 07/13/20 0335 07/15/20 0210 07/16/20 0457 07/17/20 0056  WBC 6.5 5.0 11.1* 4.4    Liver Function Tests: Recent Labs  Lab 07/11/20 0426  ALBUMIN 2.1*   No results for input(s): LIPASE, AMYLASE in the last 168 hours. Recent Labs  Lab 07/11/20 0959 07/12/20 0522  AMMONIA 61* 42*    ABG    Component Value Date/Time   PHART 7.560 (H) 06/19/2020  1513   PCO2ART 31.6 (L) 06/19/2020 1513   PO2ART 138 (H) 06/19/2020 1513   HCO3 28.5 (H) 06/19/2020 1513   TCO2 29 06/19/2020 1513   ACIDBASEDEF 1.0 06/17/2020 0939   O2SAT 99.0 06/19/2020 1513      CBG: Recent Labs  Lab 07/16/20 1504 07/16/20 1909 07/16/20 2315 07/17/20 0312 07/17/20 0809  GLUCAP 94 81 95 80 78   Total critical care time: 34 minutes  Performed by: Cheri Fowler   Critical care time was exclusive of separately billable procedures and treating other patients.   Critical care was necessary to treat or prevent imminent or life-threatening deterioration.   Critical care was time spent personally by me on the following activities:  development of treatment plan with patient and/or surrogate as well as nursing, discussions with consultants, evaluation of patient's response to treatment, examination of patient, obtaining history from patient or surrogate, ordering and performing treatments and interventions, ordering and review of laboratory studies, ordering and review of radiographic studies, pulse oximetry and re-evaluation of patient's condition.   Cheri Fowler MD [facility] See Amion for pager If no response to pager, please call [phone] until 7pm After 7pm, Please call E-link [phone]

## 2020-07-17 NOTE — Progress Notes (Signed)
  NEUROSURGERY PROGRESS NOTE   No issues overnight.   EXAM:  BP 103/66   Pulse 90   Temp 99.6 F (37.6 C) (Oral)   Resp (!) 33   Ht 5\' 4"  (1.626 m)   Wt 99 kg   SpO2 100%   BMI 37.46 kg/m   Trach Eyes closed. Opens eyes to pain Pupils reactive, gaze neutral Resists eye opening with left eye Purposeful LUE. W/D LLE to pain No movement RUE/RLE  IMPRESSION/PLAN 60 y.o. female s/p ruptured MCA and vasospasm s/p hemicraniectomy. Stable. Sepsis - HCAP - continue supportive care - Sepsis secondary to HCAP - per CCM - Trach per CCM - Seizures - per neuro

## 2020-07-18 LAB — CBC WITH DIFFERENTIAL/PLATELET
Abs Immature Granulocytes: 0.03 10*3/uL (ref 0.00–0.07)
Basophils Absolute: 0 10*3/uL (ref 0.0–0.1)
Basophils Relative: 1 %
Eosinophils Absolute: 0.2 10*3/uL (ref 0.0–0.5)
Eosinophils Relative: 7 %
HCT: 33.3 % — ABNORMAL LOW (ref 36.0–46.0)
Hemoglobin: 10 g/dL — ABNORMAL LOW (ref 12.0–15.0)
Immature Granulocytes: 1 %
Lymphocytes Relative: 27 %
Lymphs Abs: 0.9 10*3/uL (ref 0.7–4.0)
MCH: 27.2 pg (ref 26.0–34.0)
MCHC: 30 g/dL (ref 30.0–36.0)
MCV: 90.5 fL (ref 80.0–100.0)
Monocytes Absolute: 0.5 10*3/uL (ref 0.1–1.0)
Monocytes Relative: 14 %
Neutro Abs: 1.7 10*3/uL (ref 1.7–7.7)
Neutrophils Relative %: 50 %
Platelets: 181 10*3/uL (ref 150–400)
RBC: 3.68 MIL/uL — ABNORMAL LOW (ref 3.87–5.11)
RDW: 16.4 % — ABNORMAL HIGH (ref 11.5–15.5)
WBC: 3.4 10*3/uL — ABNORMAL LOW (ref 4.0–10.5)
nRBC: 0 % (ref 0.0–0.2)

## 2020-07-18 LAB — GLUCOSE, CAPILLARY
Glucose-Capillary: 110 mg/dL — ABNORMAL HIGH (ref 70–99)
Glucose-Capillary: 107 mg/dL — ABNORMAL HIGH (ref 70–99)
Glucose-Capillary: 105 mg/dL — ABNORMAL HIGH (ref 70–99)
Glucose-Capillary: 99 mg/dL (ref 70–99)
Glucose-Capillary: 85 mg/dL (ref 70–99)
Glucose-Capillary: 108 mg/dL — ABNORMAL HIGH (ref 70–99)

## 2020-07-18 LAB — BASIC METABOLIC PANEL
Anion gap: 9 (ref 5–15)
BUN: 19 mg/dL (ref 6–20)
CO2: 28 mmol/L (ref 22–32)
Calcium: 10.2 mg/dL (ref 8.9–10.3)
Chloride: 99 mmol/L (ref 98–111)
Creatinine, Ser: 0.59 mg/dL (ref 0.44–1.00)
GFR, Estimated: >60 mL/min (ref >60)
Glucose, Bld: 107 mg/dL — ABNORMAL HIGH (ref 70–99)
Potassium: 4.3 mmol/L (ref 3.5–5.1)
Sodium: 136 mmol/L (ref 135–145)

## 2020-07-18 MED ORDER — NOREPINEPHRINE 4 MG/250ML-% IV SOLN
INTRAVENOUS | Status: AC
  Filled 2020-07-18: qty 250

## 2020-07-18 NOTE — Progress Notes (Signed)
NAME:  Denise Savage, MRN:  [id number], DOB:  09/18/1960, LOS: 32 ADMISSION DATE:  06/15/2020, CONSULTATION DATE:  06/16/20 REFERRING MD:  Dr. Maurice Small, CHIEF COMPLAINT:  VDRF   Brief History:  60 year old female presented headache x 5 days found to have left sylvian fissure subarachnoid hemorrhage with a left MCA bifurcation aneurysm.  Transferred to [facility] for further care and went for diagnostic angiography with coiling, complicated by intraoperative rupture and subsequent severe vasospasm leading to malignant left MCA stroke.  She returned to the ICU intubated.  Past Medical History:   has a past medical history of Abdominal hernia.  Significant Hospital Events:  1/3 present to [facility] emergency department 1/4 transferred to [facility], NSGY admitting/ taken to neuro IR for coiling.  Remained on vent postop 1/5 more lethargic and unresponsive, stat head CT was done which showed progressive left MCA infarct with brain edema and 6 mm midline shift.  Persistent nonprogressive subarachnoid hemorrhage. 1/6 worsening edema on CT, 3% saline continued 1/7 Keppra increased for subclinical seizures on LTM EEG 1/8 Low-grade febrile 100.9 1/12 modafinil started 1/26 Seizure-like activity >>Given Ativan, LTM EEG hooked up, keprra increased 1/27 seizure activity noted on EEG 1/28 tolerated PSV at 10/5 all day  Consults:  Neuro IR PCCM  Procedures:  1/4 diagnostic angiogram with coiling 1/7 left hemicraniectomy 1/7 R IJ central line >> out 1/19 PEG tube 1/19 tracheostomy  Significant Diagnostic Tests:  1/3 >> CT angiogram head neck > small volume acute subarachnoid hemorrhage centered at the base of the left sylvian fissure.  6 x 6 x 9 mm irregular aneurysm arising from the left MCA bifurcation.  1/5 CTH >> Diffused and partially reabsorbed subarachnoid hemorrhage since yesterday. 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  1/6 CTH >> Progressive swelling of the left MCA  infarct with new midline shift measuring 6 mm. Nonprogressive subarachnoid hemorrhage. No evidence of new territory infarct  1/6 CTH  >> increasing midline shift 8 mm  1/6 >> LTM EEG evidence of epileptogenicity arising from right parietal region , no seizures  1/8 LTM EEG >> evidence ofcortical dysfunctionand epileptogenicity in left frontal region consistent with underlying craniotomy andstructural abnormality. After propofol was stopped, eeg showed periodic epileptiform discharges at 1-1.5hz  In left frontal region which were intermittent and rhythmic which is on the ictal-interictal continuum , worse compared to 1/7  1/9 LTM EEG similar to 1/8  1/10 echocardiogram>>EF 60-65%. No valvular abnormalities. Greater than 50% respiratory variability of IVC EEG 1/21 PL EDS left frontal region  Micro Data:  1/3 SARS/ flu >>neg 1/4 MRSA  >> neg 1/8 resp >> ng 2/2 respiratory culture: Klebsiella 2/2 blood cultures  >> negative  Antimicrobials:  Unasyn 1/8>>1/12 Unasyn 1/20>>>1/25 Zosyn 2/2 >>  Interim History / Subjective:  Patient remains on trach collar for almost 24 hours, intermittently she gets tachypneic but then she settles down.  She was able to maintain O2 sat above 90% all the time  Objective   Blood pressure 132/78, pulse 92, temperature 99.9 F (37.7 C), temperature source Oral, resp. rate (!) 40, height 5\' 4"  (1.626 m), weight 100 kg, SpO2 97 %.    Vent Mode: Stand-by FiO2 (%):  [35 %-40 %] 35 %   Intake/Output Summary (Last 24 hours) at 07/18/2020 0827 Last data filed at 07/18/2020 0600 Gross per 24 hour  Intake 2529.53 ml  Output 2450 ml  Net 79.53 ml   Filed Weights   07/16/20 0500 07/17/20 0500 07/18/20 0400  Weight: 99 kg 99 kg 100 kg   Physical Exam: Gen: Middle-aged African-American female, on trach collar HEENT: Left gaze deviation, able to correct to midline, sclera anicteric, left craniotomy, moist mucous membranes Neck:  Trach in place, no JVD CV:   regular rate and rhythm; no murmurs Resp: Tachypneic, course breath sounds, no wheezing Abd: + bowel sounds; soft, nondistended, PEG in place Ext: No edema, warm Skin: no rash Neuro: Eyes closed, does not follow commands.  Moving left side  Resolved Hospital Problem list     Assessment & Plan:   Aneurysmal subarachnoid hemorrhage due to ruptured left MCA aneurysm s/p coiling.  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 and midline shift s/p hemi-craniectomy 1/7  No more episodes of seizures Will not change any antiseizure medication unless patient is having seizure for more than 15 minutes Continue Keppra 1500 twice daily, valproate and vimpat  Neurology and Neurosurgery following Ativan as needed for seizure Continue modafinil  Sepsis improved Healthcare associated pneumonia with Klebsiella Respiratory culture grew Klebsiella pneumonia Blood cultures are negative IV Zosyn was changed to ceftriaxone, to complete antibiotic therapy for 7 days  Chronic respiratory failure with hypoxia require mechanical ventilation Status post tracheostomy Patient remained on trach collar almost 24-hour Plan for LTAC vs vent SNF  Dysphagia status post PEG tube  Continue tube feeds  Recurrent hypoglycemia Patient still intermittently requiring D50 bolus Not on oral hypoglycemic or insulin Continue tube feeds and monitor blood sugar  Best practice (evaluated daily)  Diet: tube feeds Pain/Anxiety/Delirium protocol (if indicated): n/a VAP protocol (if indicated): Per protocol DVT prophylaxis: SCDs only  GI prophylaxis: PPI Glucose control: None needed. Continue to monitor Mobility: As tolerated Disposition: ICU Family: per NS  Goals of Care:  Last date of multidisciplinary goals of care discussion: 2/2 Family and staff present: both daughters, [name] and [name], PCCM Summary of discussion: Continue full aggressive care Follow up goals of care discussion due: daughters wish  to pursue full scope of care   Code Status: Full   Labs   CBC: Recent Labs  Lab 07/13/20 0335 07/15/20 0210 07/16/20 0457 07/17/20 0056 07/18/20 0147  WBC 6.5 5.0 11.1* 4.4 3.4*  NEUTROABS 3.8 3.2 7.4 2.6 1.7  HGB 9.9* 10.0* 10.0* 9.6* 10.0*  HCT 31.9* 32.6* 30.9* 29.3* 33.3*  MCV 91.7 91.1 88.0 91.3 90.5  PLT 231 214 165 136* 181    Basic Metabolic Panel: Recent Labs  Lab 07/13/20 0335 07/16/20 0457 07/18/20 0147  NA 138 134* 136  K 4.2 5.7* 4.3  CL 99 99 99  CO2 30 27 28   GLUCOSE 111* 91 107*  BUN 28* 33* 19  CREATININE 0.80 0.82 0.59  CALCIUM 10.7* 10.5* 10.2  MG  --  2.0  --   PHOS  --  3.7  --    GFR: Estimated Creatinine Clearance: 85.9 mL/min (by C-G formula based on SCr of 0.59 mg/dL). Recent Labs  Lab 07/15/20 0210 07/16/20 0457 07/17/20 0056 07/18/20 0147  WBC 5.0 11.1* 4.4 3.4*    Liver Function Tests: No results for input(s): AST, ALT, ALKPHOS, BILITOT, PROT, ALBUMIN in the last 168 hours. No results for input(s): LIPASE, AMYLASE in the last 168 hours. Recent Labs  Lab 07/11/20 0959 07/12/20 0522  AMMONIA 61* 42*    ABG    Component Value Date/Time   PHART 7.560 (H) 06/19/2020 1513   PCO2ART 31.6 (L) 06/19/2020 1513   PO2ART 138 (H) 06/19/2020 1513   HCO3 28.5 (H) 06/19/2020  1513   TCO2 29 06/19/2020 1513   ACIDBASEDEF 1.0 06/17/2020 0939   O2SAT 99.0 06/19/2020 1513      CBG: Recent Labs  Lab 07/17/20 1547 07/17/20 1916 07/17/20 2307 07/18/20 0322 07/18/20 0744  GLUCAP 92 82 85 110* 107*     Cheri Fowler MD [place] [facility] See Amion for pager If no response to pager, please call [phone] until 7pm After 7pm, Please call E-link [phone]

## 2020-07-18 NOTE — Progress Notes (Signed)
   Providing Compassionate, Quality Care - Together  NEUROSURGERY PROGRESS NOTE   S: No issues overnight  O: EXAM:  BP 132/78   Pulse 92   Temp 99.7 F (37.6 C) (Axillary)   Resp (!) 40   Ht 5\' 4"  (1.626 m)   Wt 100 kg   SpO2 97%   BMI 37.84 kg/m   Trach, eyes closed [place] to pain, intermittently opens eyes to pain PERRL Left flap soft, incision well-healed Purposeful left upper extremity, withdraws left lower extremity pain Hemiplegic, right  ASSESSMENT:  60 y.o. female with  1.  ruptured MCA aneurysm, complicated by vasospasm 2.  Pneumonia -Status post hemicraniectomy  PLAN: -Continue supportive care -ABX per CCM -Trach -Antiepileptics -DVT prophylaxis    Thank you for allowing me to participate in this patient's care.  Please do not hesitate to call with questions or concerns.   66, DO Neurosurgeon [facility] Cell: [phone]

## 2020-07-19 DIAGNOSIS — J189 Pneumonia, unspecified organism: Secondary | ICD-10-CM

## 2020-07-19 LAB — GLUCOSE, CAPILLARY
Glucose-Capillary: 103 mg/dL — ABNORMAL HIGH (ref 70–99)
Glucose-Capillary: 103 mg/dL — ABNORMAL HIGH (ref 70–99)
Glucose-Capillary: 120 mg/dL — ABNORMAL HIGH (ref 70–99)
Glucose-Capillary: 125 mg/dL — ABNORMAL HIGH (ref 70–99)
Glucose-Capillary: 84 mg/dL (ref 70–99)
Glucose-Capillary: 96 mg/dL (ref 70–99)

## 2020-07-19 LAB — CBC WITH DIFFERENTIAL/PLATELET
Abs Immature Granulocytes: 0.07 10*3/uL (ref 0.00–0.07)
Basophils Absolute: 0 10*3/uL (ref 0.0–0.1)
Basophils Relative: 1 %
Eosinophils Absolute: 0.2 10*3/uL (ref 0.0–0.5)
Eosinophils Relative: 5 %
HCT: 34.1 % — ABNORMAL LOW (ref 36.0–46.0)
Hemoglobin: 10.8 g/dL — ABNORMAL LOW (ref 12.0–15.0)
Immature Granulocytes: 2 %
Lymphocytes Relative: 44 %
Lymphs Abs: 1.7 10*3/uL (ref 0.7–4.0)
MCH: 28.3 pg (ref 26.0–34.0)
MCHC: 31.7 g/dL (ref 30.0–36.0)
MCV: 89.3 fL (ref 80.0–100.0)
Monocytes Absolute: 0.6 10*3/uL (ref 0.1–1.0)
Monocytes Relative: 16 %
Neutro Abs: 1.2 10*3/uL — ABNORMAL LOW (ref 1.7–7.7)
Neutrophils Relative %: 32 %
Platelets: 151 10*3/uL (ref 150–400)
RBC: 3.82 MIL/uL — ABNORMAL LOW (ref 3.87–5.11)
RDW: 16.4 % — ABNORMAL HIGH (ref 11.5–15.5)
WBC: 3.8 10*3/uL — ABNORMAL LOW (ref 4.0–10.5)
nRBC: 0 % (ref 0.0–0.2)

## 2020-07-19 NOTE — Progress Notes (Signed)
NAME:  Denise Savage, MRN:  [id number], DOB:  [birth year], LOS: 33 ADMISSION DATE:  06/15/2020, CONSULTATION DATE:  06/16/20 REFERRING MD:  Dr. Maurice Small, CHIEF COMPLAINT:  VDRF   Brief History:  60 year old female presented headache x 5 days found to have left sylvian fissure subarachnoid hemorrhage with a left MCA bifurcation aneurysm.  Transferred to [facility] for further care and went for diagnostic angiography with coiling, complicated by intraoperative rupture and subsequent severe vasospasm leading to malignant left MCA stroke.  She returned to the ICU intubated.  Past Medical History:   has a past medical history of Abdominal hernia.  Significant Hospital Events:  1/3 present to [facility] emergency department 1/4 transferred to [facility], NSGY admitting/ taken to neuro IR for coiling.  Remained on vent postop 1/5 more lethargic and unresponsive, stat head CT was done which showed progressive left MCA infarct with brain edema and 6 mm midline shift.  Persistent nonprogressive subarachnoid hemorrhage. 1/6 worsening edema on CT, 3% saline continued 1/7 Keppra increased for subclinical seizures on LTM EEG 1/8 Low-grade febrile 100.9 1/12 modafinil started 1/26 Seizure-like activity >>Given Ativan, LTM EEG hooked up, keprra increased 1/27 seizure activity noted on EEG 1/28 tolerated PSV at 10/5 all day  Consults:  Neuro IR PCCM  Procedures:  1/4 diagnostic angiogram with coiling 1/7 left hemicraniectomy 1/7 R IJ central line >> out 1/19 PEG tube 1/19 tracheostomy  Significant Diagnostic Tests:  1/3 >> CT angiogram head neck > small volume acute subarachnoid hemorrhage centered at the base of the left sylvian fissure.  6 x 6 x 9 mm irregular aneurysm arising from the left MCA bifurcation.  1/5 CTH >> Diffused and partially reabsorbed subarachnoid hemorrhage since yesterday. 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  1/6 CTH >> Progressive swelling of the left MCA  infarct with new midline shift measuring 6 mm. Nonprogressive subarachnoid hemorrhage. No evidence of new territory infarct  1/6 CTH  >> increasing midline shift 8 mm  1/6 >> LTM EEG evidence of epileptogenicity arising from right parietal region , no seizures  1/8 LTM EEG >> evidence ofcortical dysfunctionand epileptogenicity in left frontal region consistent with underlying craniotomy andstructural abnormality. After propofol was stopped, eeg showed periodic epileptiform discharges at 1-1.5hz  In left frontal region which were intermittent and rhythmic which is on the ictal-interictal continuum , worse compared to 1/7  1/9 LTM EEG similar to 1/8  1/10 echocardiogram>>EF 60-65%. No valvular abnormalities. Greater than 50% respiratory variability of IVC EEG 1/21 PL EDS left frontal region  Micro Data:  1/3 SARS/ flu >>neg 1/4 MRSA  >> neg 1/8 resp >> ng 2/2 respiratory culture: Klebsiella 2/2 blood cultures  >> negative  Antimicrobials:  Unasyn 1/8>>1/12 Unasyn 1/20>>>1/25 Zosyn 2/2 >> 2/5 Ceftrixone 2/5  >>  Interim History / Subjective:  Patient remained on trach collar for over 48 hours, intermittently she gets tachypneic but then comes down, still has large amount of secretions, requiring frequent suctioning  Objective   Blood pressure 116/72, pulse 100, temperature 99.5 F (37.5 C), temperature source Oral, resp. rate (!) 35, height 5\' 4"  (1.626 m), weight 100.4 kg, SpO2 98 %.    Vent Mode: Stand-by FiO2 (%):  [35 %] 35 %   Intake/Output Summary (Last 24 hours) at 07/19/2020 0853 Last data filed at 07/19/2020 0600 Gross per 24 hour  Intake 2321.8 ml  Output 1800 ml  Net 521.8 ml   Filed Weights   07/17/20 0500 07/18/20 0400 07/19/20 0400  Weight:  99 kg 100 kg 100.4 kg   Physical Exam: Gen: Middle-aged African-American female, on trach collar HEENT: Left gaze deviation, able to correct to midline, sclera anicteric, left craniotomy, moist mucous membranes Neck:   Trach in place, no JVD CV:  regular rate and rhythm; no murmurs Resp: course breath sounds, no wheezing Abd: + bowel sounds; soft, nondistended, PEG in place Ext: No edema, warm Skin: no rash Neuro: Spontaneously opens eyes, does not follow commands.  Moving left side  Resolved Hospital Problem list   Sepsis  Assessment & Plan:   Aneurysmal subarachnoid hemorrhage due to ruptured left MCA aneurysm s/p coiling.  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 and midline shift s/p hemi-craniectomy 1/7  No more episodes of seizures Will not change any antiseizure medication unless patient is having seizure for more than 15 minutes Continue Keppra 1500 twice daily, valproate and vimpat  Neurology and Neurosurgery following Ativan as needed for seizure Continue modafinil  Healthcare associated pneumonia with Klebsiella Respiratory culture grew Klebsiella pneumonia Blood cultures are negative IV Zosyn was changed to ceftriaxone, to complete antibiotic therapy for 7 days  Chronic respiratory failure with hypoxia required mechanical ventilation Status post tracheostomy Patient remained on trach collar almost 48-hour Plan for LTAC vs vent SNF  Dysphagia status post PEG tube  Continue tube feeds  Recurrent hypoglycemia Patient still intermittently requiring D50 bolus Not on oral hypoglycemic or insulin Continue tube feeds and monitor blood sugar  PCCM will follow for trach 1-2 times a week.  Please call with questions  Best practice (evaluated daily)  Diet: tube feeds Pain/Anxiety/Delirium protocol (if indicated): n/a VAP protocol (if indicated): Per protocol DVT prophylaxis: SCDs only  GI prophylaxis: PPI Glucose control: None needed. Continue to monitor Mobility: As tolerated Disposition: Per primary team Family: per NS  Goals of Care:  Last date of multidisciplinary goals of care discussion: 2/2 Family and staff present: both daughters, [name] and [name],  PCCM Summary of discussion: Continue full aggressive care Follow up goals of care discussion due: daughters wish to pursue full scope of care   Code Status: Full   Labs   CBC: Recent Labs  Lab 07/15/20 0210 07/16/20 0457 07/17/20 0056 07/18/20 0147 07/19/20 0322  WBC 5.0 11.1* 4.4 3.4* 3.8*  NEUTROABS 3.2 7.4 2.6 1.7 1.2*  HGB 10.0* 10.0* 9.6* 10.0* 10.8*  HCT 32.6* 30.9* 29.3* 33.3* 34.1*  MCV 91.1 88.0 91.3 90.5 89.3  PLT 214 165 136* 181 151    Basic Metabolic Panel: Recent Labs  Lab 07/13/20 0335 07/16/20 0457 07/18/20 0147  NA 138 134* 136  K 4.2 5.7* 4.3  CL 99 99 99  CO2 30 27 28   GLUCOSE 111* 91 107*  BUN 28* 33* 19  CREATININE 0.80 0.82 0.59  CALCIUM 10.7* 10.5* 10.2  MG  --  2.0  --   PHOS  --  3.7  --    GFR: Estimated Creatinine Clearance: 86.2 mL/min (by C-G formula based on SCr of 0.59 mg/dL). Recent Labs  Lab 07/16/20 0457 07/17/20 0056 07/18/20 0147 07/19/20 0322  WBC 11.1* 4.4 3.4* 3.8*    Liver Function Tests: No results for input(s): AST, ALT, ALKPHOS, BILITOT, PROT, ALBUMIN in the last 168 hours. No results for input(s): LIPASE, AMYLASE in the last 168 hours. No results for input(s): AMMONIA in the last 168 hours.  ABG    Component Value Date/Time   PHART 7.560 (H) 06/19/2020 1513   PCO2ART 31.6 (L) 06/19/2020 1513   PO2ART  138 (H) 06/19/2020 1513   HCO3 28.5 (H) 06/19/2020 1513   TCO2 29 06/19/2020 1513   ACIDBASEDEF 1.0 06/17/2020 0939   O2SAT 99.0 06/19/2020 1513      CBG: Recent Labs  Lab 07/18/20 1535 07/18/20 1916 07/18/20 2307 07/19/20 0315 07/19/20 0748  GLUCAP 99 85 108* 84 125*     Cheri Fowler MD [place] [facility] See Amion for pager If no response to pager, please call [phone] until 7pm After 7pm, Please call E-link [phone]

## 2020-07-19 NOTE — Progress Notes (Signed)
   Providing Compassionate, Quality Care - Together  NEUROSURGERY PROGRESS NOTE   S: No issues overnight.  Tolerating trach collar  O: EXAM:  BP 116/72   Pulse 100   Temp 97.8 F (36.6 C) (Axillary)   Resp (!) 35   Ht 5\' 4"  (1.626 m)   Wt 100.4 kg   SpO2 98%   BMI 37.99 kg/m   Trach, eyes closed, open to voice PERRL Left flap soft, incision well-healed Purposeful left upper extremity, withdraws left lower extremity pain Hemiplegic, right  ASSESSMENT:  60 y.o. female with  1.  ruptured MCA aneurysm, complicated by vasospasm 2.  Pneumonia -Status post hemicraniectomy  PLAN: -Continue supportive care -ABX per CCM -Trach, tolerating trach collar therefore being transferred to floor -Antiepileptics -DVT prophylaxis    Thank you for allowing me to participate in this patient's care.  Please do not hesitate to call with questions or concerns.   66, DO Neurosurgeon [facility] Cell: [phone]

## 2020-07-20 LAB — GLUCOSE, CAPILLARY
Glucose-Capillary: 79 mg/dL (ref 70–99)
Glucose-Capillary: 102 mg/dL — ABNORMAL HIGH (ref 70–99)
Glucose-Capillary: 92 mg/dL (ref 70–99)
Glucose-Capillary: 86 mg/dL (ref 70–99)
Glucose-Capillary: 87 mg/dL (ref 70–99)
Glucose-Capillary: 90 mg/dL (ref 70–99)

## 2020-07-20 LAB — CULTURE, BLOOD (ROUTINE X 2)
Culture: NO GROWTH
Culture: NO GROWTH
Special Requests: ADEQUATE
Special Requests: ADEQUATE

## 2020-07-20 LAB — CBC WITH DIFFERENTIAL/PLATELET
Abs Immature Granulocytes: 0.07 10*3/uL (ref 0.00–0.07)
Basophils Absolute: 0 10*3/uL (ref 0.0–0.1)
Basophils Relative: 1 %
Eosinophils Absolute: 0.2 10*3/uL (ref 0.0–0.5)
Eosinophils Relative: 4 %
HCT: 31.5 % — ABNORMAL LOW (ref 36.0–46.0)
Hemoglobin: 9.6 g/dL — ABNORMAL LOW (ref 12.0–15.0)
Immature Granulocytes: 2 %
Lymphocytes Relative: 36 %
Lymphs Abs: 1.5 10*3/uL (ref 0.7–4.0)
MCH: 27.8 pg (ref 26.0–34.0)
MCHC: 30.5 g/dL (ref 30.0–36.0)
MCV: 91.3 fL (ref 80.0–100.0)
Monocytes Absolute: 0.7 10*3/uL (ref 0.1–1.0)
Monocytes Relative: 16 %
Neutro Abs: 1.7 10*3/uL (ref 1.7–7.7)
Neutrophils Relative %: 41 %
Platelets: 165 10*3/uL (ref 150–400)
RBC: 3.45 MIL/uL — ABNORMAL LOW (ref 3.87–5.11)
RDW: 16.8 % — ABNORMAL HIGH (ref 11.5–15.5)
WBC: 4.1 10*3/uL (ref 4.0–10.5)
nRBC: 0.5 % — ABNORMAL HIGH (ref 0.0–0.2)

## 2020-07-20 MED ORDER — IPRATROPIUM-ALBUTEROL 0.5-2.5 (3) MG/3ML IN SOLN: 3.0000 mL | RESPIRATORY_TRACT | Status: DC | PRN

## 2020-07-20 MED ORDER — GUAIFENESIN 100 MG/5ML PO SOLN
15.0000 mL | ORAL | Status: DC
  Administered 2020-07-20 – 2020-09-10 (×308): 300 mg
  Filled 2020-07-20 (×30): qty 15
  Filled 2020-07-20: qty 5
  Filled 2020-07-20: qty 10
  Filled 2020-07-20 (×11): qty 15
  Filled 2020-07-20: qty 5
  Filled 2020-07-20 (×28): qty 15
  Filled 2020-07-20 (×2): qty 5
  Filled 2020-07-20 (×17): qty 15
  Filled 2020-07-20: qty 5
  Filled 2020-07-20 (×9): qty 15
  Filled 2020-07-20: qty 5
  Filled 2020-07-20: qty 15
  Filled 2020-07-20: qty 10
  Filled 2020-07-20: qty 15
  Filled 2020-07-20: qty 5
  Filled 2020-07-20 (×6): qty 15
  Filled 2020-07-20: qty 5
  Filled 2020-07-20 (×4): qty 15
  Filled 2020-07-20: qty 5
  Filled 2020-07-20 (×14): qty 15
  Filled 2020-07-20: qty 10
  Filled 2020-07-20: qty 15
  Filled 2020-07-20: qty 25
  Filled 2020-07-20 (×6): qty 15
  Filled 2020-07-20: qty 5
  Filled 2020-07-20 (×2): qty 15
  Filled 2020-07-20: qty 5
  Filled 2020-07-20 (×30): qty 15
  Filled 2020-07-20: qty 5
  Filled 2020-07-20 (×5): qty 15
  Filled 2020-07-20: qty 25
  Filled 2020-07-20 (×4): qty 15
  Filled 2020-07-20: qty 5
  Filled 2020-07-20 (×9): qty 15
  Filled 2020-07-20: qty 5
  Filled 2020-07-20 (×8): qty 15
  Filled 2020-07-20: qty 5
  Filled 2020-07-20 (×14): qty 15
  Filled 2020-07-20: qty 5
  Filled 2020-07-20: qty 15
  Filled 2020-07-20: qty 5
  Filled 2020-07-20: qty 15
  Filled 2020-07-20: qty 25
  Filled 2020-07-20 (×4): qty 15
  Filled 2020-07-20: qty 5
  Filled 2020-07-20 (×21): qty 15
  Filled 2020-07-20: qty 25
  Filled 2020-07-20: qty 10
  Filled 2020-07-20: qty 15
  Filled 2020-07-20: qty 5
  Filled 2020-07-20 (×11): qty 15
  Filled 2020-07-20: qty 5
  Filled 2020-07-20 (×5): qty 15
  Filled 2020-07-20: qty 25
  Filled 2020-07-20 (×2): qty 15
  Filled 2020-07-20: qty 5
  Filled 2020-07-20 (×10): qty 15
  Filled 2020-07-20: qty 10
  Filled 2020-07-20: qty 5
  Filled 2020-07-20 (×6): qty 15
  Filled 2020-07-20: qty 5
  Filled 2020-07-20 (×11): qty 15
  Filled 2020-07-20: qty 5

## 2020-07-20 NOTE — Progress Notes (Signed)
Assisted tele visit to patient with daughter. ° °Altan Kraai P, RN  °

## 2020-07-20 NOTE — Progress Notes (Signed)
  NEUROSURGERY PROGRESS NOTE   No issues overnight.   EXAM:  BP 117/75   Pulse 93   Temp 99.1 F (37.3 C) (Axillary)   Resp (!) 26   Ht 5\' 4"  (1.626 m)   Wt 100 kg   SpO2 99%   BMI 37.84 kg/m   Trach collar Opens eyes to pain PERRL PurposefulLUE. W/D LLE to pain No movement RUE/RLE Incisions: c/d/i  IMPRESSION/PLAN 60 y.o. female s/pruptured MCA and vasospasm s/p hemicraniectomy. Stable. Sepsis - HCAP - continue supportive care - Sepsis secondary to HCAP - per CCM - Tolerating trach collar - per CCM - Seizures - per neuro - Dispo planning

## 2020-07-20 NOTE — NC FL2 (Signed)
[place] LEVEL OF CARE SCREENING TOOL     IDENTIFICATION  Patient Name: Denise Savage Birthdate: [birth year] Sex: female Admission Date (Current Location): 06/15/2020  [facility] and [place] Number:  Herbalist and Address:  [facility] [place], [place] [address], [place], [place] [address]      Provider Number: [id number]  Attending Physician Name and Address:  Consuella Lose, MD  Relative Name and Phone Number:  Riki Sheer Daughter   [phone]    Current Level of Care: Hospital Recommended Level of Care: [place] Prior Approval Number:    Date Approved/Denied:   PASRR Number: [id number]  Discharge Plan: SNF    Current Diagnoses: Patient Active Problem List   Diagnosis Date Noted  . Acute respiratory failure ([place])   . ICH (intracerebral hemorrhage) ([place]) 06/18/2020  . Aneurysmal subarachnoid hemorrhage ([place]) 06/16/2020  . SAH (subarachnoid hemorrhage) ([place]) 06/15/2020    Orientation RESPIRATION BLADDER Height & Weight      (not oriented)  Tracheostomy (28% FiO2) Incontinent,External catheter Weight: 220 lb 7.4 oz (100 kg) Height:  $Remove'5\' 4"'[id number]$  (162.6 cm)  BEHAVIORAL SYMPTOMS/MOOD NEUROLOGICAL BOWEL NUTRITION STATUS      Incontinent Feeding tube  AMBULATORY STATUS COMMUNICATION OF NEEDS Skin   Total Care Non-Verbally Surgical wounds                       Personal Care Assistance Level of Assistance  Bathing,Feeding,Dressing,Total care Bathing Assistance: Maximum assistance Feeding assistance: Maximum assistance Dressing Assistance: Maximum assistance Total Care Assistance: Maximum assistance   Functional Limitations Info  Sight,Hearing,Speech Sight Info: Adequate Hearing Info: Adequate Speech Info: Impaired (trach)    SPECIAL CARE FACTORS FREQUENCY                       Contractures Contractures Info: Not present    Additional Factors Info  Code Status,Allergies Code Status Info:  full Allergies Info: hydrocodone           Current Medications (07/20/2020):  This is the current hospital active medication list Current Facility-Administered Medications  Medication Dose Route Frequency Provider Last Rate Last Admin  . acetaminophen (TYLENOL) tablet 650 mg  650 mg Per Tube Q4H PRN Judith Part, MD   650 mg at 07/19/20 2126   Or  . acetaminophen (TYLENOL) suppository 650 mg  650 mg Rectal Q4H PRN Judith Part, MD      . cefTRIAXone (ROCEPHIN) 2 g in sodium chloride 0.9 % 100 mL IVPB  2 g Intravenous Q24H Jacky Kindle, MD   Stopped at 07/19/20 2155  . chlorhexidine gluconate (MEDLINE KIT) (PERIDEX) 0.12 % solution 15 mL  15 mL Mouth Rinse BID Judith Part, MD   15 mL at 07/20/20 0723  . Chlorhexidine Gluconate Cloth 2 % PADS 6 each  6 each Topical Q0600 Greta Doom, MD   6 each at 07/19/20 1006  . dextrose 50 % solution 25-50 mL  25-50 mL Intravenous Q4H PRN Jacky Kindle, MD      . feeding supplement (OSMOLITE 1.2 CAL) liquid 1,000 mL  1,000 mL Per Tube Continuous Kipp Brood, MD 55 mL/hr at 07/20/20 0427 1,000 mL at 07/20/20 0427  . feeding supplement (PROSource TF) liquid 90 mL  90 mL Per Tube BID Kipp Brood, MD   90 mL at 07/20/20 1028  . free water 200 mL  200 mL Per Tube Q6H Jacky Kindle, MD   200 mL  at 07/20/20 1157  . guaiFENesin (ROBITUSSIN) 100 MG/5ML solution 300 mg  15 mL Per Tube Q4H Jennelle Human B, NP   300 mg at 07/20/20 1444  . heparin injection 5,000 Units  5,000 Units Subcutaneous Q8H Kipp Brood, MD   5,000 Units at 07/20/20 1444  . HYDROcodone-acetaminophen (NORCO/VICODIN) 5-325 MG per tablet 1 tablet  1 tablet Per Tube Q4H PRN Joselyn Glassman A, RPH   1 tablet at 07/13/20 0313  . ipratropium-albuterol (DUONEB) 0.5-2.5 (3) MG/3ML nebulizer solution 3 mL  3 mL Nebulization Q4H PRN Jennelle Human B, NP      . labetalol (NORMODYNE) injection 10-40 mg  10-40 mg Intravenous Q10 min PRN Judith Part, MD   40 mg  at 06/19/20 1945  . lacosamide (VIMPAT) 50 mg in sodium chloride 0.9 % 25 mL IVPB  50 mg Intravenous Q12H Bhagat, Srishti L, MD   Stopped at 07/20/20 1057  . levETIRAcetam (KEPPRA) 100 MG/ML solution 1,500 mg  1,500 mg Per Tube BID Rigoberto Noel, MD   1,500 mg at 07/20/20 1028  . LORazepam (ATIVAN) injection 2 mg  2 mg Intravenous PRN Lora Havens, MD   2 mg at 07/15/20 0701  . MEDLINE mouth rinse  15 mL Mouth Rinse 10 times per day Judith Part, MD   15 mL at 07/20/20 1550  . metoprolol tartrate (LOPRESSOR) injection 5 mg  5 mg Intravenous Q5 min PRN Judith Part, MD      . modafinil (PROVIGIL) tablet 100 mg  100 mg Per Tube Daily Christian, Rylee, MD   100 mg at 07/20/20 1029  . ondansetron (ZOFRAN) tablet 4 mg  4 mg Oral Q4H PRN Judith Part, MD       Or  . ondansetron (ZOFRAN) injection 4 mg  4 mg Intravenous Q4H PRN Judith Part, MD   4 mg at 07/02/20 0215  . pantoprazole sodium (PROTONIX) 40 mg/20 mL oral suspension 40 mg  40 mg Per Tube Daily Judith Part, MD   40 mg at 07/20/20 1029  . polyethylene glycol (MIRALAX / GLYCOLAX) packet 17 g  17 g Per Tube Daily PRN Levada Dy, Dwayne A, RPH      . valproate (DEPACON) 750 mg in dextrose 5 % 50 mL IVPB  750 mg Intravenous Q8H Lora Havens, MD   Stopped at 07/20/20 1545     Discharge Medications: Please see discharge summary for a list of discharge medications.  Relevant Imaging Results:  Relevant Lab Results:   Additional Information SSN [id number]  Benard Halsted, LCSW

## 2020-07-21 DIAGNOSIS — J9601 Acute respiratory failure with hypoxia: Secondary | ICD-10-CM

## 2020-07-21 LAB — CBC WITH DIFFERENTIAL/PLATELET
Abs Immature Granulocytes: 0.09 10*3/uL — ABNORMAL HIGH (ref 0.00–0.07)
Basophils Absolute: 0.1 10*3/uL (ref 0.0–0.1)
Basophils Relative: 1 %
Eosinophils Absolute: 0.3 10*3/uL (ref 0.0–0.5)
Eosinophils Relative: 5 %
HCT: 35.9 % — ABNORMAL LOW (ref 36.0–46.0)
Hemoglobin: 10.9 g/dL — ABNORMAL LOW (ref 12.0–15.0)
Immature Granulocytes: 2 %
Lymphocytes Relative: 35 %
Lymphs Abs: 1.7 10*3/uL (ref 0.7–4.0)
MCH: 27.5 pg (ref 26.0–34.0)
MCHC: 30.4 g/dL (ref 30.0–36.0)
MCV: 90.4 fL (ref 80.0–100.0)
Monocytes Absolute: 0.9 10*3/uL (ref 0.1–1.0)
Monocytes Relative: 18 %
Neutro Abs: 1.9 10*3/uL (ref 1.7–7.7)
Neutrophils Relative %: 39 %
Platelets: 232 10*3/uL (ref 150–400)
RBC: 3.97 MIL/uL (ref 3.87–5.11)
RDW: 16.6 % — ABNORMAL HIGH (ref 11.5–15.5)
WBC: 4.9 10*3/uL (ref 4.0–10.5)
nRBC: 0 % (ref 0.0–0.2)

## 2020-07-21 LAB — GLUCOSE, CAPILLARY
Glucose-Capillary: 106 mg/dL — ABNORMAL HIGH (ref 70–99)
Glucose-Capillary: 107 mg/dL — ABNORMAL HIGH (ref 70–99)
Glucose-Capillary: 108 mg/dL — ABNORMAL HIGH (ref 70–99)
Glucose-Capillary: 89 mg/dL (ref 70–99)
Glucose-Capillary: 95 mg/dL (ref 70–99)
Glucose-Capillary: 96 mg/dL (ref 70–99)

## 2020-07-21 MED ORDER — VALPROIC ACID 250 MG/5ML PO SOLN
750.0000 mg | Freq: Three times a day (TID) | ORAL | Status: DC
  Administered 2020-07-21 – 2020-09-10 (×153): 750 mg
  Filled 2020-07-21 (×157): qty 15

## 2020-07-21 NOTE — Evaluation (Signed)
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  Transferred to [facility] for further care and went for diagnostic angiography with coiling 1/4, complicated by intraoperative rupture and subsequent severe vasospasm leading to malignant left MCA stroke. +seizures; 1/7 Lt hemicraniectomy; 1/19 PEG and trach. PMH: ventral hernia repair  Clinical Impression  Pt evaluated by PT/OT. Pt minimally responsive but responsiveness increased after bed into chair position and after B rolling. Pt opened eyes for as long as 10 sec at a time. Tot A +2 to roll L and R but when positioned on R side could hold self on that side by grasping rail with L hand. Request B PRAFO's  As pt currently with full PROM B ankles but not moving enough to maintain this and no active mvmt noted RLE. Currently recommending LTACH. Will put pt on a 1x/wk trial for 2 weeks so see if pt able to participate with PT/OT. PT will continue to follow.     Follow Up Recommendations LTACH    Equipment Recommendations  Other (comment) (TBD)    Recommendations for Other Services       Precautions / Restrictions Precautions Precautions: Fall;Other (comment) Precaution Comments: left hemi-craniectomy Restrictions Weight Bearing Restrictions: No      Mobility  Bed Mobility Overal bed mobility: Needs Assistance Bed Mobility: Rolling Rolling: Total assist;+2 for physical assistance;+2 for safety/equipment         General bed mobility comments: rolled R<>L in bed for pad change, pt required totalA when on right side, pt maintained grasp on bed rail with LUE. Following bed mobility, pt appeared to become more alert, keeping eyes open about 30 seconds and appeared to scan the left side of  environment    Transfers                 General transfer comment: unable  Ambulation/Gait             General Gait Details: unable  Stairs            Wheelchair Mobility    Modified Rankin (Stroke Patients Only) Modified Rankin (Stroke Patients Only) Pre-Morbid Rankin Score: No symptoms Modified Rankin: Severe disability     Balance Overall balance assessment: Needs assistance     Sitting balance - Comments: bed placed in chair position to promote trunk activation, not successful with this at this time but pt did begin to open eyes more                                     Pertinent Vitals/Pain Pain Assessment: Faces Faces Pain Scale: Hurts a little bit Pain Location: grimacing with L elbow mobility Pain Descriptors / Indicators: Grimacing Pain Intervention(s): Limited activity within patient's tolerance;Monitored during session;Repositioned    Home Living Family/patient expects to be discharged to:: Private residence Living Arrangements: Children               Additional Comments: pt unable to provide information, no family present to provide information    Prior Function Level of Independence: Independent         Comments: per chart pt was ambulatory and independent     Hand Dominance        Extremity/Trunk  Assessment   Upper Extremity Assessment Upper Extremity Assessment: Defer to OT evaluation RUE Deficits / Details: noted edema, left UE elevated, provided PROM, difficult to assess secondary to cognition LUE Deficits / Details: noted edema, left UE elevated;pt grimacing with PROM elbow extension, attempted to provide hand over hand assistance for pt to wash face, pt not resistive initially then becoming resistive to movement;with elbow supported, pt bringing hand to mouth/nose    Lower Extremity Assessment Lower Extremity Assessment: RLE deficits/detail;LLE deficits/detail RLE Deficits / Details: no active  mvmt noted RLE. PROM full at this point in time. Recommend PRAFO due to no mvmt RLE Sensation: decreased light touch;decreased proprioception RLE Coordination: decreased fine motor;decreased gross motor LLE Deficits / Details: noted active mvmt at R ankle, knee, and hip. Pt did not move on commands so strength unclear. PROM full at this point. Would benefit from [facility] due to decreased level of arousal       Communication   Communication: Expressive difficulties;Receptive difficulties  Cognition Arousal/Alertness: Lethargic Behavior During Therapy: Flat affect Overall Cognitive Status: Difficult to assess                                 General Comments: pt keeping eyes shut majority of session, intermittent eyes opening and held open about 10 seconds. unsure if pt responding to command or if opening eyes as reflexive response      General Comments General comments (skin integrity, edema, etc.): SpO2 98-100% 5L/min FiO2 28%, HR 94-96 bpm. Pt very gurgly after session, RN notified for suction    Exercises General Exercises - Lower Extremity Ankle Circles/Pumps: Both;PROM;10 reps;Supine Short Arc Quad: PROM;Both;10 reps;Supine Heel Slides: PROM;Both;5 reps;Supine   Assessment/Plan    PT Assessment Patient needs continued PT services  PT Problem List Decreased strength;Decreased range of motion;Decreased activity tolerance;Decreased balance;Decreased mobility;Decreased coordination;Decreased cognition;Decreased knowledge of use of DME;Decreased safety awareness;Decreased knowledge of precautions;Obesity;Impaired tone;Impaired sensation;Cardiopulmonary status limiting activity;Pain       PT Treatment Interventions Functional mobility training;Therapeutic activities;Therapeutic exercise;Balance training;Neuromuscular re-education;Cognitive remediation;Patient/family education    PT Goals (Current goals can be found in the Care Plan section)  Acute Rehab PT Goals Patient  Stated Goal: none stated PT Goal Formulation: Patient unable to participate in goal setting Time For Goal Achievement: 08/04/20 Potential to Achieve Goals: Fair    Frequency Min 1X/week   Barriers to discharge        Co-evaluation PT/OT/SLP Co-Evaluation/Treatment: Yes Reason for Co-Treatment: Complexity of the patient's impairments (multi-system involvement);Necessary to address cognition/behavior during functional activity PT goals addressed during session: Mobility/safety with mobility;Other (comment) (cognition)         AM-PAC PT "6 Clicks" Mobility  Outcome Measure Help needed turning from your back to your side while in a flat bed without using bedrails?: Total Help needed moving from lying on your back to sitting on the side of a flat bed without using bedrails?: Total Help needed moving to and from a bed to a chair (including a wheelchair)?: Total Help needed standing up from a chair using your arms (e.g., wheelchair or bedside chair)?: Total Help needed to walk in hospital room?: Total Help needed climbing 3-5 steps with a railing? : Total 6 Click Score: 6    End of Session Equipment Utilized During Treatment: Oxygen Activity Tolerance: Patient limited by lethargy Patient left: in bed;with call bell/phone within reach;with bed alarm set Nurse Communication: Mobility status;Other (comment) (need for suction) PT  Visit Diagnosis: Muscle weakness (generalized) (M62.81);Hemiplegia and hemiparesis Hemiplegia - Right/Left: Right Hemiplegia - dominant/non-dominant: Dominant Hemiplegia - caused by: Nontraumatic SAH    Time: 1601-0932 PT Time Calculation (min) (ACUTE ONLY): 31 min   Charges:   PT Evaluation $PT Eval Moderate Complexity: 1 Mod          Lyanne Co, PT  Acute Rehab Services  Pager [phone] Office [phone]   Lawana Chambers Janavia Rottman 07/21/2020, 1:45 PM

## 2020-07-21 NOTE — Progress Notes (Signed)
Pt still produces copious amount of secretions which necessitates frequent suctioning, encouraged her to cough out sometimes, MEWs been yellow throughout the shift, will continue to monitor for any changes

## 2020-07-21 NOTE — Progress Notes (Signed)
PHARMACIST - PHYSICIAN COMMUNICATION  DR:   Conchita Paris  CONCERNING: IV to Oral Route Change Policy  RECOMMENDATION: This patient is receiving valproic acid by the intravenous route.  Based on criteria approved by the Pharmacy and Therapeutics Committee, the intravenous medication(s) is/are being converted to the equivalent oral dose form(s).   DESCRIPTION: These criteria include:  The patient is eating (either orally or via tube) and/or has been taking other orally administered medications for a least 24 hours  The patient has no evidence of active gastrointestinal bleeding or impaired GI absorption (gastrectomy, short bowel, patient on TNA or NPO).  If you have questions about this conversion, please contact the Pharmacy Department  []   [phone] )  ( [phone] []   [phone] )  [facility] [x]   [phone] )  [facility] []   [phone] )  [facility] []   [phone] )  [facility]   [place], [facility] 07/21/2020 8:38 AM

## 2020-07-21 NOTE — Procedures (Signed)
Tracheostomy Change Note  Patient Details:   Name: Denise Savage DOB: 1961-05-14 MRN: [id number]    Airway Documentation:     Evaluation  O2 sats: stable throughout Complications: No apparent complications Patient did tolerate procedure well. Bilateral Breath Sounds: Diminished,Clear   RT changed pt's trach to #6 shiley flex cfs per order. No complications noted with trach change, minimal blood present. Color change on CO2 detector was noted post trach insertion. RT will monitor.     Merlene Laughter 07/21/2020, 4:01 PM

## 2020-07-21 NOTE — Evaluation (Signed)
Occupational Therapy Evaluation Patient Details Name: Denise Savage MRN: [id number] DOB: Apr 24, 1961 Today's Date: 07/21/2020    History of Present Illness 60 year old female presented headache x 5 days found to have left sylvian fissure subarachnoid hemorrhage with a left MCA bifurcation aneurysm.  Transferred to [facility] for further care and went for diagnostic angiography with coiling 1/4, complicated by intraoperative rupture and subsequent severe vasospasm leading to malignant left MCA stroke. +seizures; 1/7 Lt hemicraniecttomy; 1/19 PEG and trach   Clinical Impression   Pt evaluated by occupational therapy this date. Pt nonverbal and kept eyes shut majority of session with intermittent periods of eyes open about 10seconds. Pt required totalA+2 for rolling R<>L, with rolling pt became more alert and held herself in sidelying with LUE grasping bed rail. Pt with RUE hemiplegia. Pt attempting to bring LUE to mouth when elbow supported. Currently recommend d/c to [facility]. Will continue to follow pt acutely at least 1x/week and progress pt as appropriate.    Follow Up Recommendations  LTACH    Equipment Recommendations  Other (comment) (TBD)    Recommendations for Other Services       Precautions / Restrictions Precautions Precautions: Fall;Other (comment) Precaution Comments: left hemi-craniectomy Restrictions Weight Bearing Restrictions: No      Mobility Bed Mobility Overal bed mobility: Needs Assistance Bed Mobility: Rolling Rolling: Total assist;+2 for physical assistance;+2 for safety/equipment         General bed mobility comments: rolled R<>L in bed for pad change, pt required totalA when on right side, pt maintained grasp on bed rail with LUE. Following bed mobility, pt appeared to become more alert, keeping eyes open about 30 seconds and appeared to scan the left side of environment    Transfers                 General transfer comment: deferred    Balance                                            ADL either performed or assessed with clinical judgement   ADL Overall ADL's : Needs assistance/impaired                                       General ADL Comments: currently requires totalA for all ADL;limited participation secondary to cognition and level of arousal     Vision   Additional Comments: will continue to aseess, when pt's eyes open appeared to have left gaze preference, pt squinting eyes and opening one eye at a time occassionally     Perception     Praxis      Pertinent Vitals/Pain Pain Assessment: Faces Faces Pain Scale: Hurts a little bit Pain Location: grimacing with L elbow mobility Pain Descriptors / Indicators: Grimacing Pain Intervention(s): Limited activity within patient's tolerance;Monitored during session     Hand Dominance     Extremity/Trunk Assessment Upper Extremity Assessment Upper Extremity Assessment: RUE deficits/detail;LUE deficits/detail;Difficult to assess due to impaired cognition RUE Deficits / Details: noted edema, left UE elevated, provided PROM, difficult to assess secondary to cognition LUE Deficits / Details: noted edema, left UE elevated;pt grimacing with PROM elbow extension, attempted to provide hand over hand assistance for pt to wash face, pt not resistive initially then becoming resistive to movement;with elbow supported, pt  bringing hand to mouth/nose   Lower Extremity Assessment Lower Extremity Assessment: Defer to PT evaluation       Communication Communication Communication: Expressive difficulties;Receptive difficulties   Cognition Arousal/Alertness: Lethargic Behavior During Therapy: Flat affect Overall Cognitive Status: Difficult to assess                                 General Comments: pt keeping eyes shut majority of session, intermittent eyes opening and held open about 10 seconds. unsure if pt responding to command or if  opening eyes as reflexive response   General Comments  SpO2 98%-100% 5L/min FiO2 28%;HR 94bpm-96bpm, RR 31;1x productive cough during session, pt then with audible gurgle at end of session notified RN to suction pt    Exercises Exercises: Other exercises   Shoulder Instructions      Home Living Family/patient expects to be discharged to:: Private residence Living Arrangements: Children                               Additional Comments: pt unable to provide information, no family present to provide information      Prior Functioning/Environment          Comments: pt unable to provide information and no family available        OT Problem List: Decreased strength;Decreased range of motion;Decreased activity tolerance;Impaired balance (sitting and/or standing);Impaired vision/perception;Decreased coordination;Decreased cognition;Decreased knowledge of use of DME or AE;Decreased safety awareness;Decreased knowledge of precautions;Cardiopulmonary status limiting activity;Impaired tone;Obesity;Impaired UE functional use;Pain;Increased edema      OT Treatment/Interventions: Self-care/ADL training;Energy conservation;Neuromuscular education;DME and/or AE instruction;Therapeutic activities;Cognitive remediation/compensation;Patient/family education;Balance training;Visual/perceptual remediation/compensation    OT Goals(Current goals can be found in the care plan section) Acute Rehab OT Goals OT Goal Formulation: Patient unable to participate in goal setting Time For Goal Achievement: 08/04/20 Potential to Achieve Goals: Fair ADL Goals Pt Will Perform Grooming: with mod assist;sitting;bed level Additional ADL Goal #1: Pt will follow one step commands with increased time and multi modal cues. Additional ADL Goal #2: Pt will complete bed mobility with moderateA+2 in preparation for ADL.  OT Frequency: Min 1X/week   Barriers to D/C:            Co-evaluation               AM-PAC OT "6 Clicks" Daily Activity     Outcome Measure Help from another person eating meals?: Total Help from another person taking care of personal grooming?: Total Help from another person toileting, which includes using toliet, bedpan, or urinal?: Total Help from another person bathing (including washing, rinsing, drying)?: Total Help from another person to put on and taking off regular upper body clothing?: Total Help from another person to put on and taking off regular lower body clothing?: Total 6 Click Score: 6   End of Session Equipment Utilized During Treatment: Oxygen Nurse Communication: Other (comment) (need for suctioning)  Activity Tolerance: Patient limited by lethargy;Patient limited by pain Patient left: in bed;with call bell/phone within reach;with bed alarm set  OT Visit Diagnosis: Other abnormalities of gait and mobility (R26.89);Muscle weakness (generalized) (M62.81);Feeding difficulties (R63.3);Other symptoms and signs involving cognitive function;Hemiplegia and hemiparesis;Pain Hemiplegia - Right/Left: Right Hemiplegia - caused by: Nontraumatic SAH Pain - Right/Left: Left Pain - part of body:  (elbow)  Time: 6837-2902 OT Time Calculation (min): 31 min Charges:  OT General Charges $OT Visit: 1 Visit OT Evaluation $OT Eval High Complexity: 1 High  Jameson Tormey OTR/L Acute Rehabilitation Services Office: [phone]   Rebeca Alert 07/21/2020, 1:18 PM

## 2020-07-21 NOTE — Progress Notes (Signed)
Patient mews scores have constantly remain on 3 to 4. Charge nurse notified. These scores may be patient baseline due to change in MEWS scores since patient arrival to the unit on 07/20/20 at 1610 from [address] [place] .Marland Kitchen Patient is resting at this time, no distress noted. Will continue to support and monitor patient.

## 2020-07-21 NOTE — Progress Notes (Addendum)
Nutrition Follow-up  DOCUMENTATION CODES:   Obesity unspecified  INTERVENTION:  Continue Tube Feeding via PEG:  Osmolite 1.2 @ 55 ml/hr (1320 ml) 90 ml ProSource BID  Provides: 1744 kcal, 117 grams protein, 208 grams carbohydrate, and 1070 ml free water.   200 ml free water every 6 hours Total free water: 1870 ml   NUTRITION DIAGNOSIS:   Inadequate oral intake related to inability to eat as evidenced by NPO status. Ongoing.   GOAL:   Patient will meet greater than or equal to 90% of their needs Met with TF.   MONITOR:   TF tolerance  REASON FOR ASSESSMENT:   Ventilator    ASSESSMENT:   Pt with PMH of abdominal hernia admitted with L MCA aneurysm s/p coiling complicated by intraoperative rupture and subsequent severe vasospasm.   1/5 cortrak placed (tip gastric)  1/7 hemi-craniectomy 1/19 s/p trach and PEG placement   Per MD, pt remained on TC for >48 hours, intermittently gets tachypneic, and continues to have large amounts of secretions requiring frequent suctioning. RN endorses need for frequent suctioning. CCM to follow-up 1-2x/week for trach care/management.   Pt continues to tolerate TF via PEG. Current orders: Osmolite 1.2 @ 64ml/hr with 12ml Prosource TF BID and 26ml free water flushes Q6H  UOP: 1339ml x24 hours  Generalized mild pitting edema noted in addition to mild pitting edema to BUE  Admission wt: 100 kg Current wt: 100.2 kg  Labs and medications reviewed.   Diet Order:   Diet Order            Diet NPO time specified  Diet effective midnight                 EDUCATION NEEDS:   No education needs have been identified at this time  Skin:  Skin Assessment: Reviewed RN Assessment  Last BM:  2/7 type 7  Height:   Ht Readings from Last 1 Encounters:  06/30/20 $RemoveB'5\' 4"'OqWOynES$  (1.626 m)    Weight:   Wt Readings from Last 1 Encounters:  07/21/20 100.2 kg    Ideal Body Weight:  54.4 kg  BMI:  Body mass index is 37.92  kg/m.  Estimated Nutritional Needs:   Kcal:  1600-1800  Protein:  105-125 grams  Fluid:  >1.6 L/day  Larkin Ina, MS, RD, LDN RD pager number and weekend/on-call pager number located in [place].

## 2020-07-21 NOTE — Progress Notes (Signed)
  NEUROSURGERY PROGRESS NOTE   No issues overnight.   EXAM:  BP 108/73   Pulse 82   Temp 98.3 F (36.8 C) (Axillary)   Resp (!) 23   Ht 5\' 4"  (1.626 m)   Wt 100.2 kg   SpO2 98%   BMI 37.92 kg/m   Trach collar Opens eyes to pain PERRL PurposefulLUE. W/D LLE to pain No movement RUE/RLE Incisions: c/d/i  IMPRESSION/PLAN 59 y.o. female s/pruptured MCA and vasospasm s/p hemicraniectomy. Stable. - continue supportive care - Sepsis secondary to HCAP -improving - per CCM. Complete 7 day course abx - Seizures - per neuro - Dispo planning

## 2020-07-21 NOTE — Progress Notes (Addendum)
NAME:  Denise Savage, MRN:  [id number], DOB:  [birth year], LOS: 35 ADMISSION DATE:  06/15/2020, CONSULTATION DATE:  06/16/20 REFERRING MD:  Dr. Maurice Small, CHIEF COMPLAINT:  VDRF   Brief History:  60 year old female presented headache x 5 days found to have left sylvian fissure subarachnoid hemorrhage with a left MCA bifurcation aneurysm.  Transferred to [facility] for further care and went for diagnostic angiography with coiling, complicated by intraoperative rupture and subsequent severe vasospasm leading to malignant left MCA stroke.  She returned to the ICU intubated.  Past Medical History:   has a past medical history of Abdominal hernia.  Significant Hospital Events:  1/3 present to [facility] emergency department 1/4 transferred to [facility], NSGY admitting/ taken to neuro IR for coiling.  Remained on vent postop 1/5 more lethargic and unresponsive, stat head CT was done which showed progressive left MCA infarct with brain edema and 6 mm midline shift.  Persistent nonprogressive subarachnoid hemorrhage. 1/6 worsening edema on CT, 3% saline continued 1/7 Keppra increased for subclinical seizures on LTM EEG 1/8 Low-grade febrile 100.9 1/12 modafinil started 1/26 Seizure-like activity >>Given Ativan, LTM EEG hooked up, keprra increased 1/27 seizure activity noted on EEG 1/28 tolerated PSV at 10/5 all day  Consults:  Neuro IR PCCM  Procedures:  1/4 diagnostic angiogram with coiling 1/7 left hemicraniectomy 1/7 R IJ central line >> out 1/19 PEG tube 1/19 tracheostomy  Significant Diagnostic Tests:  1/3 >> CT angiogram head neck > small volume acute subarachnoid hemorrhage centered at the base of the left sylvian fissure.  6 x 6 x 9 mm irregular aneurysm arising from the left MCA bifurcation.  1/5 CTH >> Diffused and partially reabsorbed subarachnoid hemorrhage since yesterday. 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  1/6 CTH >> Progressive swelling of the left MCA  infarct with new midline shift measuring 6 mm. Nonprogressive subarachnoid hemorrhage. No evidence of new territory infarct  1/6 CTH  >> increasing midline shift 8 mm  1/6 >> LTM EEG evidence of epileptogenicity arising from right parietal region , no seizures  1/8 LTM EEG >> evidence ofcortical dysfunctionand epileptogenicity in left frontal region consistent with underlying craniotomy andstructural abnormality. After propofol was stopped, eeg showed periodic epileptiform discharges at 1-1.5hz  In left frontal region which were intermittent and rhythmic which is on the ictal-interictal continuum , worse compared to 1/7  1/9 LTM EEG similar to 1/8  1/10 echocardiogram>>EF 60-65%. No valvular abnormalities. Greater than 50% respiratory variability of IVC EEG 1/21 PL EDS left frontal region  Micro Data:  1/3 SARS/ flu >>neg 1/4 MRSA  >> neg 1/8 resp >> ng 2/2 respiratory culture: Klebsiella 2/2 blood cultures  >> negative  Antimicrobials:  Unasyn 1/8>>1/12 Unasyn 1/20>>>1/25 Zosyn 2/2 >> 2/5 Ceftrixone 2/5  >>  Interim History / Subjective:  Remains on ATC 28% Ongoing secretions but strong cough  Objective   Blood pressure 112/68, pulse 80, temperature 98.1 F (36.7 C), temperature source Axillary, resp. rate (!) 22, height 5\' 4"  (1.626 m), weight 100.2 kg, SpO2 99 %.    FiO2 (%):  [28 %] 28 %   Intake/Output Summary (Last 24 hours) at 07/21/2020 1111 Last data filed at 07/20/2020 1800 Gross per 24 hour  Intake 642.5 ml  Output 500 ml  Net 142.5 ml   Filed Weights   07/19/20 0400 07/20/20 0404 07/21/20 0445  Weight: 100.4 kg 100 kg 100.2 kg   Physical Exam: General:  Older adult female sitting upright in bed in  NAD HEENT: MM pink/moist, midline 6 XLT distal- cuff down, left prior craniotomy Neuro: left gaze, not f/c, purposeful LUE, flaccid RUE, withdrawals in RLE, moves LLE spont.  CV: rr, no murmur PULM:  Non labored, mildly tachypneic, lungs clear GI: soft, bs  active, purwick, PEG in placed Extremities: warm/dry, no LE edema   Resolved Hospital Problem list   Sepsis Aneurysmal subarachnoid hemorrhage due to ruptured left MCA aneurysm s/p coiling.  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 and midline shift s/p hemi-craniectomy 1/7  Assessment & Plan:   Healthcare associated pneumonia with Klebsiella Completes 7 days of abx today Trend WBC /fever curve  Chronic respiratory failure with hypoxia required mechanical ventilation Status post tracheostomy Will exchange to shiley 6 cuffless, hold on down trach sizing given ongoing secretions Currently on ATC 28% Ongoing aggressive pulm hygiene Intermittent CXR Trach care Plan for LTAC vs vent SNF  Dysphagia status post PEG tube  Continue tube feeds per PEG  Remainder per primary.  PCCM will follow for trach 1-2 times a week.  Please call with questions  Best practice (evaluated daily)  Diet: tube feeds Pain/Anxiety/Delirium protocol (if indicated): n/a VAP protocol (if indicated): Per protocol DVT prophylaxis: SCDs only  GI prophylaxis: PPI Glucose control: None needed. Continue to monitor Mobility: As tolerated Disposition: Per primary team Family: per NS  Goals of Care:  Last date of multidisciplinary goals of care discussion: 2/2 Family and staff present: both daughters, [name] and [place], PCCM Summary of discussion: Continue full aggressive care Follow up goals of care discussion due: daughters wish to pursue full scope of care   Code Status: Full   Labs   CBC: Recent Labs  Lab 07/17/20 0056 07/18/20 0147 07/19/20 0322 07/20/20 0057 07/21/20 0100  WBC 4.4 3.4* 3.8* 4.1 4.9  NEUTROABS 2.6 1.7 1.2* 1.7 1.9  HGB 9.6* 10.0* 10.8* 9.6* 10.9*  HCT 29.3* 33.3* 34.1* 31.5* 35.9*  MCV 91.3 90.5 89.3 91.3 90.4  PLT 136* 181 151 165 232    Basic Metabolic Panel: Recent Labs  Lab 07/16/20 0457 07/18/20 0147  NA 134* 136  K 5.7* 4.3  CL 99 99  CO2 27 28   GLUCOSE 91 107*  BUN 33* 19  CREATININE 0.82 0.59  CALCIUM 10.5* 10.2  MG 2.0  --   PHOS 3.7  --    GFR: Estimated Creatinine Clearance: 86.1 mL/min (by C-G formula based on SCr of 0.59 mg/dL). Recent Labs  Lab 07/18/20 0147 07/19/20 0322 07/20/20 0057 07/21/20 0100  WBC 3.4* 3.8* 4.1 4.9    Liver Function Tests: No results for input(s): AST, ALT, ALKPHOS, BILITOT, PROT, ALBUMIN in the last 168 hours. No results for input(s): LIPASE, AMYLASE in the last 168 hours. No results for input(s): AMMONIA in the last 168 hours.  ABG    Component Value Date/Time   PHART 7.560 (H) 06/19/2020 1513   PCO2ART 31.6 (L) 06/19/2020 1513   PO2ART 138 (H) 06/19/2020 1513   HCO3 28.5 (H) 06/19/2020 1513   TCO2 29 06/19/2020 1513   ACIDBASEDEF 1.0 06/17/2020 0939   O2SAT 99.0 06/19/2020 1513      CBG: Recent Labs  Lab 07/20/20 1532 07/20/20 2009 07/20/20 2315 07/21/20 0328 07/21/20 0753  GLUCAP 86 87 90 107* 106*

## 2020-07-22 LAB — CBC WITH DIFFERENTIAL/PLATELET
Abs Immature Granulocytes: 0 10*3/uL (ref 0.00–0.07)
Basophils Absolute: 0.1 10*3/uL (ref 0.0–0.1)
Basophils Relative: 2 %
Eosinophils Absolute: 0.1 10*3/uL (ref 0.0–0.5)
Eosinophils Relative: 2 %
HCT: 34.8 % — ABNORMAL LOW (ref 36.0–46.0)
Hemoglobin: 11 g/dL — ABNORMAL LOW (ref 12.0–15.0)
Lymphocytes Relative: 36 %
Lymphs Abs: 1.8 10*3/uL (ref 0.7–4.0)
MCH: 28.2 pg (ref 26.0–34.0)
MCHC: 31.6 g/dL (ref 30.0–36.0)
MCV: 89.2 fL (ref 80.0–100.0)
Monocytes Absolute: 0.6 10*3/uL (ref 0.1–1.0)
Monocytes Relative: 13 %
Neutro Abs: 2.3 10*3/uL (ref 1.7–7.7)
Neutrophils Relative %: 47 %
Platelets: 258 10*3/uL (ref 150–400)
RBC: 3.9 MIL/uL (ref 3.87–5.11)
RDW: 16.9 % — ABNORMAL HIGH (ref 11.5–15.5)
WBC: 4.9 10*3/uL (ref 4.0–10.5)
nRBC: 0 % (ref 0.0–0.2)
nRBC: 0 /100 WBC

## 2020-07-22 LAB — GLUCOSE, CAPILLARY
Glucose-Capillary: 81 mg/dL (ref 70–99)
Glucose-Capillary: 100 mg/dL — ABNORMAL HIGH (ref 70–99)
Glucose-Capillary: 87 mg/dL (ref 70–99)
Glucose-Capillary: 98 mg/dL (ref 70–99)
Glucose-Capillary: 93 mg/dL (ref 70–99)

## 2020-07-22 NOTE — Progress Notes (Signed)
CM has reached out to both LTACH to just see is she should be a candidate with pending medicaid or with medicaid once approved and she is Not a candidate for either LTACH. Will need SNF workup. CM will update CSW.

## 2020-07-22 NOTE — Progress Notes (Signed)
  NEUROSURGERY PROGRESS NOTE   No issues overnight.  EXAM:  BP 118/79 (BP Location: Left Arm)   Pulse 87   Temp 98.6 F (37 C) (Axillary)   Resp (!) 22   Ht 5\' 4"  (1.626 m)   Wt 98.8 kg   SpO2 99%   BMI 37.39 kg/m   Trachcollar Opens eyes to pain PERRL PurposefulLUE. W/D LLE to pain No movement RUE/RLE Incisions: c/d/i  IMPRESSION/PLAN 60 y.o. female s/pruptured MCA and vasospasm s/p hemicraniectomy. Stable. - continue supportive care - Sepsis secondary to HCAP: resolved. Completed 7 days abx per CCM - Seizures - per neuro - Dispo planning

## 2020-07-23 LAB — GLUCOSE, CAPILLARY
Glucose-Capillary: 104 mg/dL — ABNORMAL HIGH (ref 70–99)
Glucose-Capillary: 78 mg/dL (ref 70–99)
Glucose-Capillary: 85 mg/dL (ref 70–99)
Glucose-Capillary: 95 mg/dL (ref 70–99)
Glucose-Capillary: 98 mg/dL (ref 70–99)
Glucose-Capillary: 99 mg/dL (ref 70–99)

## 2020-07-23 LAB — CBC WITH DIFFERENTIAL/PLATELET
Abs Immature Granulocytes: 0.07 10*3/uL (ref 0.00–0.07)
Basophils Absolute: 0 10*3/uL (ref 0.0–0.1)
Basophils Relative: 1 %
Eosinophils Absolute: 0.3 10*3/uL (ref 0.0–0.5)
Eosinophils Relative: 6 %
HCT: 36.6 % (ref 36.0–46.0)
Hemoglobin: 10.8 g/dL — ABNORMAL LOW (ref 12.0–15.0)
Immature Granulocytes: 2 %
Lymphocytes Relative: 37 %
Lymphs Abs: 1.7 10*3/uL (ref 0.7–4.0)
MCH: 27.2 pg (ref 26.0–34.0)
MCHC: 29.5 g/dL — ABNORMAL LOW (ref 30.0–36.0)
MCV: 92.2 fL (ref 80.0–100.0)
Monocytes Absolute: 0.7 10*3/uL (ref 0.1–1.0)
Monocytes Relative: 16 %
Neutro Abs: 1.7 10*3/uL (ref 1.7–7.7)
Neutrophils Relative %: 38 %
Platelets: 213 10*3/uL (ref 150–400)
RBC: 3.97 MIL/uL (ref 3.87–5.11)
RDW: 16.8 % — ABNORMAL HIGH (ref 11.5–15.5)
WBC: 4.5 10*3/uL (ref 4.0–10.5)
nRBC: 0 % (ref 0.0–0.2)

## 2020-07-23 NOTE — Progress Notes (Signed)
  NEUROSURGERY PROGRESS NOTE   No issues overnight.   EXAM:  BP 104/73 (BP Location: Right Arm)   Pulse 74   Temp 98.4 F (36.9 C) (Axillary)   Resp 16   Ht 5\' 4"  (1.626 m)   Wt 98.8 kg   SpO2 99%   BMI 37.39 kg/m   Trachcollar Opens eyes to pain PERRL PurposefulLUE. W/D LLE to pain No movement RUE/RLE Incisions: c/d/i  IMPRESSION/PLAN 60 y.o. female s/pruptured MCA and vasospasm s/p hemicraniectomy. Stable. - continue supportive care - Sepsis secondary to HCAP: resolved. Completed 7 days abx per CCM - Seizures - per neuro - Dispo planning

## 2020-07-24 LAB — CBC WITH DIFFERENTIAL/PLATELET
Abs Immature Granulocytes: 0.06 10*3/uL (ref 0.00–0.07)
Basophils Absolute: 0.1 10*3/uL (ref 0.0–0.1)
Basophils Relative: 1 %
Eosinophils Absolute: 0.2 10*3/uL (ref 0.0–0.5)
Eosinophils Relative: 5 %
HCT: 35.8 % — ABNORMAL LOW (ref 36.0–46.0)
Hemoglobin: 11.4 g/dL — ABNORMAL LOW (ref 12.0–15.0)
Immature Granulocytes: 1 %
Lymphocytes Relative: 27 %
Lymphs Abs: 1.4 10*3/uL (ref 0.7–4.0)
MCH: 28.6 pg (ref 26.0–34.0)
MCHC: 31.8 g/dL (ref 30.0–36.0)
MCV: 89.9 fL (ref 80.0–100.0)
Monocytes Absolute: 0.7 10*3/uL (ref 0.1–1.0)
Monocytes Relative: 14 %
Neutro Abs: 2.8 10*3/uL (ref 1.7–7.7)
Neutrophils Relative %: 52 %
Platelets: 267 10*3/uL (ref 150–400)
RBC: 3.98 MIL/uL (ref 3.87–5.11)
RDW: 16.6 % — ABNORMAL HIGH (ref 11.5–15.5)
WBC: 5.3 10*3/uL (ref 4.0–10.5)
nRBC: 0 % (ref 0.0–0.2)

## 2020-07-24 LAB — GLUCOSE, CAPILLARY
Glucose-Capillary: 100 mg/dL — ABNORMAL HIGH (ref 70–99)
Glucose-Capillary: 103 mg/dL — ABNORMAL HIGH (ref 70–99)
Glucose-Capillary: 109 mg/dL — ABNORMAL HIGH (ref 70–99)
Glucose-Capillary: 116 mg/dL — ABNORMAL HIGH (ref 70–99)
Glucose-Capillary: 77 mg/dL (ref 70–99)
Glucose-Capillary: 98 mg/dL (ref 70–99)

## 2020-07-24 MED ORDER — GERHARDT'S BUTT CREAM
TOPICAL_CREAM | Freq: Two times a day (BID) | CUTANEOUS | Status: DC
  Administered 2020-07-27 – 2020-09-09 (×9): 1 via TOPICAL
  Filled 2020-07-24 (×9): qty 1

## 2020-07-24 NOTE — Consult Note (Addendum)
WOC Nurse Consult Note: Patient receiving care in [facility] [phone] Reason for Consult: MASD Skin breakdown on buttocks Wound type: MASD/IAD on buttocks and in the perineal area.  Pressure Injury POA: NA Wound bed: Perineal area red with no breakdown, some minor breakdown beginning on the buttocks Drainage (amount, consistency, odor) None Dressing procedure/placement/frequency: Clean the entire buttock and perineal area with soap and water, rinse and thoroughly dry especially in the folds. Apply Gerherdt's Butt Cream twice daily or PRN soiling.   ICD-10 CM Codes for Irritant Dermatitis L24A0 - Due to friction or contact with body fluids; unspecified  Monitor the wound area(s) for worsening of condition such as: Signs/symptoms of infection, increase in size, development of or worsening of odor, development of pain, or increased pain at the affected locations.   Notify the medical team if any of these develop.  Thank you for the consult. WOC nurse will not follow at this time.   Please re-consult the WOC team if needed.  Renaldo Reel Katrinka Blazing, MSN, RN, CMSRN, Angus Seller, [facility] Wound Treatment Associate Pager [phone]

## 2020-07-24 NOTE — Progress Notes (Signed)
  NEUROSURGERY PROGRESS NOTE   No issues overnight.   EXAM:  BP 128/79 (BP Location: Right Arm)   Pulse (!) 103   Temp (!) 97.5 F (36.4 C) (Oral)   Resp 18   Ht 5\' 4"  (1.626 m)   Wt 96.6 kg   SpO2 100%   BMI 36.56 kg/m   Trachcollar Opens eyes to pain PERRL PurposefulLUE. W/D LLE to pain No movement RUE/RLE Incisions: c/d/i  IMPRESSION/PLAN 60 y.o. female s/pruptured MCA and vasospasm s/p hemicraniectomy. Stable. - continue supportive care - Sepsis secondary to HCAP: resolved. Completed 7 days abx per CCM - Seizures - per neuro - Dispo planning

## 2020-07-24 NOTE — Plan of Care (Signed)
  Problem: Education: Goal: Knowledge of disease or condition will improve Outcome: Progressing Goal: Knowledge of secondary prevention will improve Outcome: Progressing Goal: Knowledge of patient specific risk factors addressed and post discharge goals established will improve Outcome: Progressing Goal: Individualized Educational Video(s) Outcome: Progressing   Problem: Health Behavior/Discharge Planning: Goal: Ability to manage health-related needs will improve Outcome: Progressing   Problem: Self-Care: Goal: Ability to participate in self-care as condition permits will improve Outcome: Progressing Goal: Verbalization of feelings and concerns over difficulty with self-care will improve Outcome: Progressing Goal: Ability to communicate needs accurately will improve Outcome: Progressing   Problem: Nutrition: Goal: Risk of aspiration will decrease Outcome: Progressing Goal: Dietary intake will improve Outcome: Progressing   Problem: Nutrition: Goal: Risk of aspiration will decrease Outcome: Progressing Goal: Dietary intake will improve Outcome: Progressing   Problem: Spontaneous Subarachnoid Hemorrhage Tissue Perfusion: Goal: Complications of Spontaneous Subarachnoid Hemorrhage will be minimized Outcome: Progressing

## 2020-07-25 LAB — CBC WITH DIFFERENTIAL/PLATELET
Abs Immature Granulocytes: 0.06 10*3/uL (ref 0.00–0.07)
Basophils Absolute: 0.1 10*3/uL (ref 0.0–0.1)
Basophils Relative: 1 %
Eosinophils Absolute: 0.3 10*3/uL (ref 0.0–0.5)
Eosinophils Relative: 4 %
HCT: 36.7 % (ref 36.0–46.0)
Hemoglobin: 10.8 g/dL — ABNORMAL LOW (ref 12.0–15.0)
Immature Granulocytes: 1 %
Lymphocytes Relative: 27 %
Lymphs Abs: 1.8 10*3/uL (ref 0.7–4.0)
MCH: 27.1 pg (ref 26.0–34.0)
MCHC: 29.4 g/dL — ABNORMAL LOW (ref 30.0–36.0)
MCV: 92.2 fL (ref 80.0–100.0)
Monocytes Absolute: 0.7 10*3/uL (ref 0.1–1.0)
Monocytes Relative: 11 %
Neutro Abs: 3.7 10*3/uL (ref 1.7–7.7)
Neutrophils Relative %: 56 %
Platelets: 260 10*3/uL (ref 150–400)
RBC: 3.98 MIL/uL (ref 3.87–5.11)
RDW: 17.1 % — ABNORMAL HIGH (ref 11.5–15.5)
WBC: 6.6 10*3/uL (ref 4.0–10.5)
nRBC: 0 % (ref 0.0–0.2)

## 2020-07-25 LAB — GLUCOSE, CAPILLARY
Glucose-Capillary: 102 mg/dL — ABNORMAL HIGH (ref 70–99)
Glucose-Capillary: 103 mg/dL — ABNORMAL HIGH (ref 70–99)
Glucose-Capillary: 116 mg/dL — ABNORMAL HIGH (ref 70–99)
Glucose-Capillary: 86 mg/dL (ref 70–99)
Glucose-Capillary: 91 mg/dL (ref 70–99)
Glucose-Capillary: 96 mg/dL (ref 70–99)
Glucose-Capillary: 97 mg/dL (ref 70–99)

## 2020-07-25 NOTE — Progress Notes (Signed)
  NEUROSURGERY PROGRESS NOTE   No issues overnight.   EXAM:  BP 116/69 (BP Location: Right Wrist)   Pulse 93   Temp 98.3 F (36.8 C) (Axillary)   Resp 20   Ht 5\' 4"  (1.626 m)   Wt 96.4 kg   SpO2 99%   BMI 36.48 kg/m   Trachcollar Opens eyes to pain PERRL PurposefulLUE. W/D LLE to pain No movement RUE/RLE Incisions: c/d/i  IMPRESSION/PLAN 60 y.o. female s/pruptured MCA and vasospasm s/p hemicraniectomy. Stable. - continue supportive care - Sepsis secondary to HCAP: resolved. Completed 7 days abx per CCM - Seizures - per neuro - Dispo planning

## 2020-07-25 NOTE — Plan of Care (Signed)
  Problem: Education: Goal: Knowledge of disease or condition will improve Outcome: Progressing Goal: Knowledge of secondary prevention will improve Outcome: Progressing Goal: Knowledge of patient specific risk factors addressed and post discharge goals established will improve Outcome: Progressing Goal: Individualized Educational Video(s) Outcome: Progressing   Problem: Coping: Goal: Will verbalize positive feelings about self Outcome: Progressing Goal: Will identify appropriate support needs Outcome: Progressing   Problem: Health Behavior/Discharge Planning: Goal: Ability to manage health-related needs will improve Outcome: Progressing   Problem: Self-Care: Goal: Ability to participate in self-care as condition permits will improve Outcome: Progressing Goal: Verbalization of feelings and concerns over difficulty with self-care will improve Outcome: Progressing Goal: Ability to communicate needs accurately will improve Outcome: Progressing   Problem: Nutrition: Goal: Risk of aspiration will decrease Outcome: Progressing Goal: Dietary intake will improve Outcome: Progressing   Problem: Spontaneous Subarachnoid Hemorrhage Tissue Perfusion: Goal: Complications of Spontaneous Subarachnoid Hemorrhage will be minimized Outcome: Progressing   

## 2020-07-26 LAB — CBC WITH DIFFERENTIAL/PLATELET
Abs Immature Granulocytes: 0.05 10*3/uL (ref 0.00–0.07)
Basophils Absolute: 0 10*3/uL (ref 0.0–0.1)
Basophils Relative: 1 %
Eosinophils Absolute: 0.3 10*3/uL (ref 0.0–0.5)
Eosinophils Relative: 4 %
HCT: 34 % — ABNORMAL LOW (ref 36.0–46.0)
Hemoglobin: 10.7 g/dL — ABNORMAL LOW (ref 12.0–15.0)
Immature Granulocytes: 1 %
Lymphocytes Relative: 21 %
Lymphs Abs: 1.3 10*3/uL (ref 0.7–4.0)
MCH: 28.1 pg (ref 26.0–34.0)
MCHC: 31.5 g/dL (ref 30.0–36.0)
MCV: 89.2 fL (ref 80.0–100.0)
Monocytes Absolute: 0.6 10*3/uL (ref 0.1–1.0)
Monocytes Relative: 9 %
Neutro Abs: 4.1 10*3/uL (ref 1.7–7.7)
Neutrophils Relative %: 64 %
Platelets: 280 10*3/uL (ref 150–400)
RBC: 3.81 MIL/uL — ABNORMAL LOW (ref 3.87–5.11)
RDW: 17.2 % — ABNORMAL HIGH (ref 11.5–15.5)
WBC: 6.3 10*3/uL (ref 4.0–10.5)
nRBC: 0 % (ref 0.0–0.2)

## 2020-07-26 LAB — GLUCOSE, CAPILLARY
Glucose-Capillary: 108 mg/dL — ABNORMAL HIGH (ref 70–99)
Glucose-Capillary: 106 mg/dL — ABNORMAL HIGH (ref 70–99)
Glucose-Capillary: 87 mg/dL (ref 70–99)
Glucose-Capillary: 93 mg/dL (ref 70–99)
Glucose-Capillary: 92 mg/dL (ref 70–99)

## 2020-07-26 NOTE — Plan of Care (Signed)
  Problem: Education: Goal: Knowledge of disease or condition will improve Outcome: Progressing Goal: Knowledge of secondary prevention will improve Outcome: Progressing Goal: Knowledge of patient specific risk factors addressed and post discharge goals established will improve Outcome: Progressing Goal: Individualized Educational Video(s) Outcome: Progressing   Problem: Coping: Goal: Will verbalize positive feelings about self Outcome: Progressing Goal: Will identify appropriate support needs Outcome: Progressing   Problem: Health Behavior/Discharge Planning: Goal: Ability to manage health-related needs will improve Outcome: Progressing   Problem: Self-Care: Goal: Ability to participate in self-care as condition permits will improve Outcome: Progressing Goal: Verbalization of feelings and concerns over difficulty with self-care will improve Outcome: Progressing Goal: Ability to communicate needs accurately will improve Outcome: Progressing   Problem: Nutrition: Goal: Risk of aspiration will decrease Outcome: Progressing Goal: Dietary intake will improve Outcome: Progressing   Problem: Spontaneous Subarachnoid Hemorrhage Tissue Perfusion: Goal: Complications of Spontaneous Subarachnoid Hemorrhage will be minimized Outcome: Progressing   

## 2020-07-26 NOTE — Progress Notes (Signed)
  NEUROSURGERY PROGRESS NOTE   No issues overnight.   EXAM:  BP 128/71   Pulse 95   Temp 98.3 F (36.8 C) (Oral)   Resp 18   Ht 5\' 4"  (1.626 m)   Wt 95.4 kg   SpO2 100%   BMI 36.10 kg/m   Trachcollar Opens eyes to pain PERRL PurposefulLUE. W/D LLE to pain No movement RUE/RLE Incisions: c/d/i  IMPRESSION/PLAN 60 y.o. female s/pruptured MCA and vasospasm s/p hemicraniectomy. Stable. - continue supportive care - Seizures - per neuro - Dispo planning

## 2020-07-27 LAB — CBC WITH DIFFERENTIAL/PLATELET
Abs Immature Granulocytes: 0.06 10*3/uL (ref 0.00–0.07)
Basophils Absolute: 0.1 10*3/uL (ref 0.0–0.1)
Basophils Relative: 1 %
Eosinophils Absolute: 0.3 10*3/uL (ref 0.0–0.5)
Eosinophils Relative: 4 %
HCT: 37.3 % (ref 36.0–46.0)
Hemoglobin: 11.3 g/dL — ABNORMAL LOW (ref 12.0–15.0)
Immature Granulocytes: 1 %
Lymphocytes Relative: 22 %
Lymphs Abs: 1.7 10*3/uL (ref 0.7–4.0)
MCH: 27.6 pg (ref 26.0–34.0)
MCHC: 30.3 g/dL (ref 30.0–36.0)
MCV: 91 fL (ref 80.0–100.0)
Monocytes Absolute: 0.8 10*3/uL (ref 0.1–1.0)
Monocytes Relative: 10 %
Neutro Abs: 4.9 10*3/uL (ref 1.7–7.7)
Neutrophils Relative %: 62 %
Platelets: 307 10*3/uL (ref 150–400)
RBC: 4.1 MIL/uL (ref 3.87–5.11)
RDW: 17 % — ABNORMAL HIGH (ref 11.5–15.5)
WBC: 7.8 10*3/uL (ref 4.0–10.5)
nRBC: 0 % (ref 0.0–0.2)

## 2020-07-27 LAB — GLUCOSE, CAPILLARY
Glucose-Capillary: 105 mg/dL — ABNORMAL HIGH (ref 70–99)
Glucose-Capillary: 116 mg/dL — ABNORMAL HIGH (ref 70–99)
Glucose-Capillary: 74 mg/dL (ref 70–99)
Glucose-Capillary: 87 mg/dL (ref 70–99)
Glucose-Capillary: 88 mg/dL (ref 70–99)
Glucose-Capillary: 93 mg/dL (ref 70–99)

## 2020-07-27 NOTE — Plan of Care (Signed)
°  Problem: Education: Goal: Knowledge of disease or condition will improve 07/27/2020 0222 by Ronalee Red, RN Outcome: Progressing 07/27/2020 0220 by Ronalee Red, RN Outcome: Progressing Goal: Knowledge of secondary prevention will improve 07/27/2020 0222 by Ronalee Red, RN Outcome: Progressing 07/27/2020 0220 by Ronalee Red, RN Outcome: Progressing Goal: Knowledge of patient specific risk factors addressed and post discharge goals established will improve 07/27/2020 0222 by Ronalee Red, RN Outcome: Progressing 07/27/2020 0220 by Ronalee Red, RN Outcome: Progressing Goal: Individualized Educational Video(s) 07/27/2020 0222 by Ronalee Red, RN Outcome: Progressing 07/27/2020 0220 by Ronalee Red, RN Outcome: Progressing   Problem: Coping: Goal: Will verbalize positive feelings about self 07/27/2020 0222 by Ronalee Red, RN Outcome: Progressing 07/27/2020 0220 by Ronalee Red, RN Outcome: Progressing Goal: Will identify appropriate support needs 07/27/2020 0222 by Ronalee Red, RN Outcome: Progressing 07/27/2020 0220 by Ronalee Red, RN Outcome: Progressing   Problem: Health Behavior/Discharge Planning: Goal: Ability to manage health-related needs will improve 07/27/2020 0222 by Ronalee Red, RN Outcome: Progressing 07/27/2020 0220 by Ronalee Red, RN Outcome: Progressing   Problem: Self-Care: Goal: Ability to participate in self-care as condition permits will improve 07/27/2020 0222 by Ronalee Red, RN Outcome: Progressing 07/27/2020 0220 by Ronalee Red, RN Outcome: Progressing Goal: Verbalization of feelings and concerns over difficulty with self-care will improve 07/27/2020 0222 by Ronalee Red, RN Outcome: Progressing 07/27/2020 0220 by Ronalee Red, RN Outcome: Progressing Goal: Ability to communicate needs accurately will improve 07/27/2020 0222 by Ronalee Red, RN Outcome: Progressing 07/27/2020 0220 by Ronalee Red, RN Outcome:  Progressing   Problem: Nutrition: Goal: Risk of aspiration will decrease 07/27/2020 0222 by Ronalee Red, RN Outcome: Progressing 07/27/2020 0220 by Ronalee Red, RN Outcome: Progressing Goal: Dietary intake will improve 07/27/2020 0222 by Ronalee Red, RN Outcome: Progressing 07/27/2020 0220 by Ronalee Red, RN Outcome: Progressing   Problem: Spontaneous Subarachnoid Hemorrhage Tissue Perfusion: Goal: Complications of Spontaneous Subarachnoid Hemorrhage will be minimized 07/27/2020 0222 by Ronalee Red, RN Outcome: Progressing 07/27/2020 0220 by Ronalee Red, RN Outcome: Progressing

## 2020-07-27 NOTE — Progress Notes (Signed)
Physical Therapy Treatment Patient Details Name: Denise Savage MRN: [id number] DOB: 20-Jan-1961 Today's Date: 07/27/2020    History of Present Illness 60 year old female presented headache x 5 days found to have left sylvian fissure subarachnoid hemorrhage with a left MCA bifurcation aneurysm.  Transferred to [facility] for further care and went for diagnostic angiography with coiling 1/4, complicated by intraoperative rupture and subsequent severe vasospasm leading to malignant left MCA stroke. +seizures; 1/7 Lt hemicraniectomy; 1/19 PEG and trach. PMH: ventral hernia repair    PT Comments    Today's skilled session continued to address mobility. Pt continues to need extensive assist with bed mobility and exercises. Pt awake in bed this session, however did not engage or acknowledge PTA or attempt to assist with any mobility. Acute PT to continue with PT trial with PT to reassess at next visit.     Follow Up Recommendations  LTACH     Equipment Recommendations  Other (comment) (TBD)    Precautions / Restrictions Precautions Precautions: Fall;Other (comment) Precaution Comments: left hemi-craniectomy Restrictions Weight Bearing Restrictions: No    Mobility  Bed Mobility Overal bed mobility: Needs Assistance Bed Mobility: Rolling Rolling: Total assist         General bed mobility comments: rolling right for pad change with total assist.        Cognition Arousal/Alertness: Awake/alert Behavior During Therapy: Flat affect Overall Cognitive Status: Difficult to assess                   General Comments: pt awake, looking around room randomly. no response to voice or stimulus, did not track to PTA. Did no follow any commands or assist with movements when initiated by PTA.      Exercises General Exercises - Lower Extremity Ankle Circles/Pumps: PROM;Both;10 reps;Supine Heel Slides: PROM;Strengthening;Both;10 reps;Supine Hip ABduction/ADduction:  PROM;Strengthening;Both;10 reps;Supine Straight Leg Raises: PROM;Strengthening;Both;10 reps;Supine     Pertinent Vitals/Pain Pain Assessment: No/denies pain     PT Goals (current goals can now be found in the care plan section) Acute Rehab PT Goals Patient Stated Goal: none stated PT Goal Formulation: Patient unable to participate in goal setting Time For Goal Achievement: 08/04/20 Potential to Achieve Goals: Fair Progress towards PT goals: Not progressing toward goals - comment;PT to reassess next treatment (no significant change in mobility from eval)    Frequency    Min 1X/week      PT Plan Current plan remains appropriate    AM-PAC PT "6 Clicks" Mobility   Outcome Measure  Help needed turning from your back to your side while in a flat bed without using bedrails?: Total Help needed moving from lying on your back to sitting on the side of a flat bed without using bedrails?: Total Help needed moving to and from a bed to a chair (including a wheelchair)?: Total Help needed standing up from a chair using your arms (e.g., wheelchair or bedside chair)?: Total Help needed to walk in hospital room?: Total Help needed climbing 3-5 steps with a railing? : Total 6 Click Score: 6    End of Session Equipment Utilized During Treatment: Oxygen Activity Tolerance: Patient tolerated treatment well;Other (comment) (limited by cognition) Patient left: in bed;with call bell/phone within reach;with bed alarm set Nurse Communication: Mobility status PT Visit Diagnosis: Muscle weakness (generalized) (M62.81);Hemiplegia and hemiparesis Hemiplegia - Right/Left: Right Hemiplegia - dominant/non-dominant: Dominant Hemiplegia - caused by: Nontraumatic SAH     Time: 7628-3151 PT Time Calculation (min) (ACUTE ONLY): 10 min  Charges:  $Therapeutic  Exercise: 8-22 mins                    Sallyanne Kuster, [place], [facility] Acute Rehab Services [phone] 07/27/20, 2:46 PM   Sallyanne Kuster 07/27/2020, 2:45 PM

## 2020-07-27 NOTE — Progress Notes (Signed)
NAME:  Denise Savage, MRN:  [id number], DOB:  Oct 29, 1960, LOS: 41 ADMISSION DATE:  06/15/2020, CONSULTATION DATE:  06/16/20 REFERRING MD:  Dr. Maurice Small, CHIEF COMPLAINT:  VDRF   Brief History:  60 year old female presented headache x 5 days found to have left sylvian fissure subarachnoid hemorrhage with a left MCA bifurcation aneurysm.  Transferred to [facility] for further care and went for diagnostic angiography with coiling, complicated by intraoperative rupture and subsequent severe vasospasm leading to malignant left MCA stroke.  She returned to the ICU intubated.  Past Medical History:   has a past medical history of Abdominal hernia.  Significant Hospital Events:  1/3 present to [facility] emergency department 1/4 transferred to [facility], NSGY admitting/ taken to neuro IR for coiling.  Remained on vent postop 1/5 more lethargic and unresponsive, stat head CT was done which showed progressive left MCA infarct with brain edema and 6 mm midline shift.  Persistent nonprogressive subarachnoid hemorrhage. 1/6 worsening edema on CT, 3% saline continued 1/7 Keppra increased for subclinical seizures on LTM EEG 1/8 Low-grade febrile 100.9 1/12 modafinil started 1/26 Seizure-like activity >>Given Ativan, LTM EEG hooked up, keprra increased 1/27 seizure activity noted on EEG 1/28 tolerated PSV at 10/5 all day 2/14 down to 5 L trach collar and 28% FiO2  Consults:  Neuro IR PCCM  Procedures:  1/4 diagnostic angiogram with coiling 1/7 left hemicraniectomy 1/7 R IJ central line >> out 1/19 PEG tube 1/19 tracheostomy  Significant Diagnostic Tests:  1/3 >> CT angiogram head neck > small volume acute subarachnoid hemorrhage centered at the base of the left sylvian fissure.  6 x 6 x 9 mm irregular aneurysm arising from the left MCA bifurcation.  1/5 CTH >> Diffused and partially reabsorbed subarachnoid hemorrhage since yesterday. 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  1/6  CTH >> Progressive swelling of the left MCA infarct with new midline shift measuring 6 mm. Nonprogressive subarachnoid hemorrhage. No evidence of new territory infarct  1/6 CTH  >> increasing midline shift 8 mm  1/6 >> LTM EEG evidence of epileptogenicity arising from right parietal region , no seizures  1/8 LTM EEG >> evidence ofcortical dysfunctionand epileptogenicity in left frontal region consistent with underlying craniotomy andstructural abnormality. After propofol was stopped, eeg showed periodic epileptiform discharges at 1-1.5hz  In left frontal region which were intermittent and rhythmic which is on the ictal-interictal continuum , worse compared to 1/7  1/9 LTM EEG similar to 1/8  1/10 echocardiogram>>EF 60-65%. No valvular abnormalities. Greater than 50% respiratory variability of IVC EEG 1/21 PL EDS left frontal region  Micro Data:  1/3 SARS/ flu >>neg 1/4 MRSA  >> neg 1/8 resp >> ng 2/2 respiratory culture: Klebsiella 2/2 blood cultures  >> negative  Antimicrobials:  Unasyn 1/8>>1/12 Unasyn 1/20>>>1/25 Zosyn 2/2 >> 2/5 Ceftrixone 2/5  >>2/8  Interim History / Subjective:  Stable respiratory status, still with some thick secretions   Objective   Blood pressure 122/76, pulse 100, temperature 98.5 F (36.9 C), temperature source Axillary, resp. rate 16, height 5\' 4"  (1.626 m), weight 97.4 kg, SpO2 100 %.    FiO2 (%):  [28 %] 28 %   Intake/Output Summary (Last 24 hours) at 07/27/2020 1605 Last data filed at 07/27/2020 1400 Gross per 24 hour  Intake 2939.83 ml  Output 1000 ml  Net 1939.83 ml   Filed Weights   07/25/20 0424 07/26/20 0316 07/27/20 0341  Weight: 96.4 kg 95.4 kg 97.4 kg   General:  Chronically  ill-appearing F, awake and in no distress HEENT: MM pink/moist, trach in place, thick secretions Neuro: Leftward deviated gaze, staring, no new facial droop CV: s1s2 rrr no m/r/g PULM:  clungs clear, 100% on 5L trach collar, no respiratory distress GI:  soft, bsx4 active  Extremities: warm/dry, no edema  Skin: no rashes or lesions     Resolved Hospital Problem list   Sepsis Aneurysmal subarachnoid hemorrhage due to ruptured left MCA aneurysm s/p coiling.  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 and midline shift s/p hemi-craniectomy 1/7  Assessment & Plan:   Healthcare associated pneumonia with Klebsiella -completed treatment   Chronic respiratory failure with hypoxia required mechanical ventilation Status post tracheostomy -currently with shiley 6 cuffless, holding further down-sizing 2/2 secretions -Has been tolerating ATC 28% 5L  -continue pulmonary hygeine -Trach care  -primary team working on dispo planning, LTAC vs SNF    Dysphagia status post PEG tube  Continue tube feeds per PEG  Remainder per primary.  PCCM will follow for trach 1-2 times a week.  Please call with questions  Best practice (evaluated daily)  Diet: tube feeds Pain/Anxiety/Delirium protocol (if indicated): n/a VAP protocol (if indicated): Per protocol DVT prophylaxis: SCDs only  GI prophylaxis: PPI Glucose control: None needed. Continue to monitor Mobility: As tolerated Disposition: Per primary team Family: per NS  Goals of Care:  Last date of multidisciplinary goals of care discussion: 2/2 Family and staff present: both daughters, [name] and Brownell, PCCM Summary of discussion: Continue full aggressive care Follow up goals of care discussion due: daughters wish to pursue full scope of care   Code Status: Full   Labs   CBC: Recent Labs  Lab 07/23/20 0332 07/24/20 0310 07/25/20 0558 07/26/20 0350 07/27/20 0241  WBC 4.5 5.3 6.6 6.3 7.8  NEUTROABS 1.7 2.8 3.7 4.1 4.9  HGB 10.8* 11.4* 10.8* 10.7* 11.3*  HCT 36.6 35.8* 36.7 34.0* 37.3  MCV 92.2 89.9 92.2 89.2 91.0  PLT 213 267 260 280 307    Basic Metabolic Panel: No results for input(s): NA, K, CL, CO2, GLUCOSE, BUN, CREATININE, CALCIUM, MG, PHOS in the last 168  hours. GFR: Estimated Creatinine Clearance: 84.8 mL/min (by C-G formula based on SCr of 0.59 mg/dL). Recent Labs  Lab 07/24/20 0310 07/25/20 0558 07/26/20 0350 07/27/20 0241  WBC 5.3 6.6 6.3 7.8    Liver Function Tests: No results for input(s): AST, ALT, ALKPHOS, BILITOT, PROT, ALBUMIN in the last 168 hours. No results for input(s): LIPASE, AMYLASE in the last 168 hours. No results for input(s): AMMONIA in the last 168 hours.  ABG    Component Value Date/Time   PHART 7.560 (H) 06/19/2020 1513   PCO2ART 31.6 (L) 06/19/2020 1513   PO2ART 138 (H) 06/19/2020 1513   HCO3 28.5 (H) 06/19/2020 1513   TCO2 29 06/19/2020 1513   ACIDBASEDEF 1.0 06/17/2020 0939   O2SAT 99.0 06/19/2020 1513      CBG: Recent Labs  Lab 07/26/20 1940 07/27/20 0000 07/27/20 0403 07/27/20 0816 07/27/20 1214  GLUCAP 92 116* 93 87 105*      Asbury Hair R Yuridiana Formanek, PA-C [facility] See Amion for pager If no response to pager , please call [phone] until 7pm After 7:00 pm call Elink  [phone]

## 2020-07-27 NOTE — Progress Notes (Addendum)
  NEUROSURGERY PROGRESS NOTE   No issues overnight.  EXAM:  BP 121/72 (BP Location: Right Wrist)   Pulse (!) 101   Temp 98.2 F (36.8 C) (Oral)   Resp 20   Ht 5\' 4"  (1.626 m)   Wt 97.4 kg   SpO2 99%   BMI 36.86 kg/m   Awake with eyes open Trach PERRL Purposeful LUE W/D LLE to pain ?wiggle toes to commands  IMPRESSION/PLAN 60 y.o. female s/pruptured MCA and vasospasm s/p hemicraniectomy. Stable. - continue supportive care - Dispo planning   ===================================================== I have seen and examined 66 and agree with the exam, impression, and plan as documented in the note by Casandra Doffing, PA-C.  Cindra Presume, MD [facility]

## 2020-07-28 LAB — CBC WITH DIFFERENTIAL/PLATELET
Abs Immature Granulocytes: 0.05 10*3/uL (ref 0.00–0.07)
Basophils Absolute: 0.1 10*3/uL (ref 0.0–0.1)
Basophils Relative: 1 %
Eosinophils Absolute: 0.2 10*3/uL (ref 0.0–0.5)
Eosinophils Relative: 3 %
HCT: 36.5 % (ref 36.0–46.0)
Hemoglobin: 10.9 g/dL — ABNORMAL LOW (ref 12.0–15.0)
Immature Granulocytes: 1 %
Lymphocytes Relative: 21 %
Lymphs Abs: 1.6 10*3/uL (ref 0.7–4.0)
MCH: 27.4 pg (ref 26.0–34.0)
MCHC: 29.9 g/dL — ABNORMAL LOW (ref 30.0–36.0)
MCV: 91.7 fL (ref 80.0–100.0)
Monocytes Absolute: 0.9 10*3/uL (ref 0.1–1.0)
Monocytes Relative: 12 %
Neutro Abs: 4.7 10*3/uL (ref 1.7–7.7)
Neutrophils Relative %: 62 %
Platelets: 287 10*3/uL (ref 150–400)
RBC: 3.98 MIL/uL (ref 3.87–5.11)
RDW: 16.9 % — ABNORMAL HIGH (ref 11.5–15.5)
WBC: 7.5 10*3/uL (ref 4.0–10.5)
nRBC: 0 % (ref 0.0–0.2)

## 2020-07-28 LAB — GLUCOSE, CAPILLARY
Glucose-Capillary: 103 mg/dL — ABNORMAL HIGH (ref 70–99)
Glucose-Capillary: 106 mg/dL — ABNORMAL HIGH (ref 70–99)
Glucose-Capillary: 122 mg/dL — ABNORMAL HIGH (ref 70–99)
Glucose-Capillary: 129 mg/dL — ABNORMAL HIGH (ref 70–99)
Glucose-Capillary: 78 mg/dL (ref 70–99)
Glucose-Capillary: 80 mg/dL (ref 70–99)
Glucose-Capillary: 97 mg/dL (ref 70–99)

## 2020-07-28 NOTE — Progress Notes (Signed)
Switched pt from humidified oxygen to humidified room air as she does not need to O2 at this point and only the humidity for secretions to avoid mucus plugging.

## 2020-07-28 NOTE — Progress Notes (Signed)
  NEUROSURGERY PROGRESS NOTE   No issues overnight.  EXAM:  BP 116/90 (BP Location: Left Arm)   Pulse 84   Temp 98.5 F (36.9 C) (Axillary)   Resp 18   Ht 5\' 4"  (1.626 m)   Wt 94 kg   SpO2 98%   BMI 35.57 kg/m   Awake with eyes open Trach PERRL Purposeful LUE W/D LLE to pain  IMPRESSION/PLAN 60 y.o. female s/pruptured MCA and vasospasm s/p hemicraniectomy. Stable. - continue supportive care - Dispo planning

## 2020-07-28 NOTE — Progress Notes (Signed)
Spoke w/ Dr. Maisie Fus the night coverage for Neurosurgery about pt having an order for a rectal tube d/t loose stools and skin breakdown. Order granted.

## 2020-07-28 NOTE — Progress Notes (Signed)
Nutrition Follow-up  DOCUMENTATION CODES:   Obesity unspecified  INTERVENTION:  Continue Tube Feeding via PEG:  Osmolite 1.2 @ 55 ml/hr (1320 ml) 90 ml ProSource BID  Provides: 1744 kcal, 117 grams protein, 208 grams carbohydrate, and 1070 ml free water.   200 ml free water every 6 hours Total free water: 1870 ml   NUTRITION DIAGNOSIS:   Inadequate oral intake related to inability to eat as evidenced by NPO status. Ongoing.   GOAL:   Patient will meet greater than or equal to 90% of their needs Met with TF.   MONITOR:   TF tolerance  REASON FOR ASSESSMENT:   Ventilator    ASSESSMENT:   Pt with PMH of abdominal hernia admitted with L MCA aneurysm s/p coiling complicated by intraoperative rupture and subsequent severe vasospasm.   1/5 cortrak placed (tip gastric)  1/7 hemi-craniectomy 1/19 s/p trach and PEG placement  2/8 trach changed to #6 shiley flex  Pt unresponsive to RD. Pt remains on TC. Per CCM, pt with stable respiratory status, though continues to have some thick secretions. CCM with no plans for decannulation at this time due to pt's current mental status.   Pt continues to tolerate TF via PEG. Current orders: Osmolite 1.2 @ 39ml/hr with 22ml Prosource TF BID and 240ml free water flushes Q6H  UOP: 1753ml x24 hours Stool: 3x unmeasured occurrence   Labs and medications reviewed.   Diet Order:   Diet Order    None      EDUCATION NEEDS:   No education needs have been identified at this time  Skin:  Skin Assessment: Reviewed RN Assessment  Last BM:  2/14  Height:   Ht Readings from Last 1 Encounters:  06/30/20 $RemoveB'5\' 4"'[id number]$  (1.626 m)    Weight:   Wt Readings from Last 1 Encounters:  07/28/20 94 kg    Ideal Body Weight:  54.4 kg  BMI:  Body mass index is 35.57 kg/m.  Estimated Nutritional Needs:   Kcal:  1600-1800  Protein:  105-125 grams  Fluid:  >1.6 L/day  Larkin Ina, MS, RD, LDN RD pager number and weekend/on-call pager  number located in [place].

## 2020-07-28 NOTE — Progress Notes (Signed)
Occupational Therapy Treatment Patient Details Name: Denise Savage MRN: [id number] DOB: 08-13-1960 Today's Date: 07/28/2020    History of present illness 60 year old female presented headache x 5 days found to have left sylvian fissure subarachnoid hemorrhage with a left MCA bifurcation aneurysm.  Transferred to [facility] for further care and went for diagnostic angiography with coiling 1/4, complicated by intraoperative rupture and subsequent severe vasospasm leading to malignant left MCA stroke. +seizures; 1/7 Lt hemicraniectomy; 1/19 PEG and trach. PMH: ventral hernia repair   OT comments  Patient met lying supine in bed. Daughter present at bedside. Patient initially maintaining eyes closed but opened eyes with rolling R<>L in supine. Patient unable to follow verbal commands, track therapist or daughter in room, or assist with any parts of bed mobility requiring Total A +2. PROM at RUE seated EOB. BUE left on pillows for edema management at conclusion of session. Patient would benefit from continued acute OT trial 1x weekly to maximize independence and decrease caregiver burden.    Follow Up Recommendations  LTACH    Equipment Recommendations  Other (comment) (TBD)    Recommendations for Other Services      Precautions / Restrictions Precautions Precautions: Fall;Other (comment) Precaution Comments: left hemi-craniectomy, PEG, trach Restrictions Weight Bearing Restrictions: No       Mobility Bed Mobility Overal bed mobility: Needs Assistance Bed Mobility: Rolling;Supine to Sit;Sit to Supine Rolling: Total assist;+2 for physical assistance;+2 for safety/equipment   Supine to sit: Total assist;+2 for physical assistance;+2 for safety/equipment Sit to supine: Total assist;+2 for physical assistance;+2 for safety/equipment   General bed mobility comments: Total A for all parts of bed mobility. Patient able to hold onto R bed rail with LUE. Unable to follow verbal  commands.  Transfers                 General transfer comment: Deferred for patient/therapist safety.    Balance Overall balance assessment: Needs assistance Sitting-balance support: Single extremity supported;Feet supported Sitting balance-Leahy Scale: Zero Sitting balance - Comments: Total A to maintain static sitting at EOB. Patient attempting to use LUE to support trunk. Postural control: Posterior lean                                 ADL either performed or assessed with clinical judgement   ADL Overall ADL's : Needs assistance/impaired     Grooming: Total assistance;Wash/dry face;Sitting Grooming Details (indicate cue type and reason): Total A with hand over hand. Patient does not attempt to assist.                                     Vision       Perception     Praxis      Cognition Arousal/Alertness: Awake/alert Behavior During Therapy: Flat affect Overall Cognitive Status: Difficult to assess                                 General Comments: Patient with eyes closed at start of session but opens eyes with rolling in supine and with transition to EOB. Patient w/ L gaze. Does not track daughter or therapist in room.        Exercises Exercises: General Upper Extremity General Exercises - Upper Extremity Shoulder Flexion: PROM;10 reps;Seated Shoulder ABduction: PROM;10 reps;Seated  Shoulder ADduction: PROM;10 reps;Seated Elbow Flexion: PROM;10 reps;Seated Elbow Extension: PROM;10 reps;Seated Wrist Flexion: PROM;10 reps;Seated Wrist Extension: PROM;10 reps;Seated   Shoulder Instructions       General Comments Daughter present at bedside.    Pertinent Vitals/ Pain       Pain Assessment: Faces Faces Pain Scale: No hurt  Home Living                                          Prior Functioning/Environment              Frequency  Min 1X/week        Progress Toward Goals  OT  Goals(current goals can now be found in the care plan section)  Progress towards OT goals: Progressing toward goals (Patient able to keep eyes open for 70% of treatment session. Does not fixate on items in visual field.)  Acute Rehab OT Goals Patient Stated Goal: none stated OT Goal Formulation: Patient unable to participate in goal setting Time For Goal Achievement: 08/04/20 Potential to Achieve Goals: Fair ADL Goals Pt Will Perform Grooming: with mod assist;sitting;bed level Additional ADL Goal #1: Pt will follow one step commands with increased time and multi modal cues. Additional ADL Goal #2: Pt will complete bed mobility with moderateA+2 in preparation for ADL.  Plan Discharge plan remains appropriate;Frequency remains appropriate    Co-evaluation                 AM-PAC OT "6 Clicks" Daily Activity     Outcome Measure   Help from another person eating meals?: Total (NPO) Help from another person taking care of personal grooming?: Total Help from another person toileting, which includes using toliet, bedpan, or urinal?: Total Help from another person bathing (including washing, rinsing, drying)?: Total Help from another person to put on and taking off regular upper body clothing?: Total Help from another person to put on and taking off regular lower body clothing?: Total 6 Click Score: 6    End of Session Equipment Utilized During Treatment: Oxygen  OT Visit Diagnosis: Other abnormalities of gait and mobility (R26.89);Muscle weakness (generalized) (M62.81);Feeding difficulties (R63.3);Other symptoms and signs involving cognitive function;Hemiplegia and hemiparesis;Pain Hemiplegia - Right/Left: Right Hemiplegia - dominant/non-dominant: Dominant Hemiplegia - caused by: Nontraumatic SAH   Activity Tolerance Patient tolerated treatment well   Patient Left in bed;with call bell/phone within reach;with bed alarm set;with family/visitor present   Nurse Communication           Time: 9211-9417 OT Time Calculation (min): 24 min  Charges: OT General Charges $OT Visit: 1 Visit OT Treatments $Self Care/Home Management : 8-22 mins $Therapeutic Activity: 23-37 mins  Megyn Leng H. OTR/L Supplemental OT, Department of rehab services [phone]   Ishmael Berkovich R H. 07/28/2020, 3:13 PM

## 2020-07-29 LAB — CBC WITH DIFFERENTIAL/PLATELET
Abs Immature Granulocytes: 0.05 10*3/uL (ref 0.00–0.07)
Basophils Absolute: 0.1 10*3/uL (ref 0.0–0.1)
Basophils Relative: 1 %
Eosinophils Absolute: 0.2 10*3/uL (ref 0.0–0.5)
Eosinophils Relative: 3 %
HCT: 35.7 % — ABNORMAL LOW (ref 36.0–46.0)
Hemoglobin: 11.3 g/dL — ABNORMAL LOW (ref 12.0–15.0)
Immature Granulocytes: 1 %
Lymphocytes Relative: 27 %
Lymphs Abs: 2 10*3/uL (ref 0.7–4.0)
MCH: 28.5 pg (ref 26.0–34.0)
MCHC: 31.7 g/dL (ref 30.0–36.0)
MCV: 89.9 fL (ref 80.0–100.0)
Monocytes Absolute: 0.9 10*3/uL (ref 0.1–1.0)
Monocytes Relative: 11 %
Neutro Abs: 4.3 10*3/uL (ref 1.7–7.7)
Neutrophils Relative %: 57 %
Platelets: 277 10*3/uL (ref 150–400)
RBC: 3.97 MIL/uL (ref 3.87–5.11)
RDW: 17 % — ABNORMAL HIGH (ref 11.5–15.5)
WBC: 7.6 10*3/uL (ref 4.0–10.5)
nRBC: 0 % (ref 0.0–0.2)

## 2020-07-29 LAB — GLUCOSE, CAPILLARY
Glucose-Capillary: 107 mg/dL — ABNORMAL HIGH (ref 70–99)
Glucose-Capillary: 108 mg/dL — ABNORMAL HIGH (ref 70–99)
Glucose-Capillary: 109 mg/dL — ABNORMAL HIGH (ref 70–99)
Glucose-Capillary: 126 mg/dL — ABNORMAL HIGH (ref 70–99)
Glucose-Capillary: 77 mg/dL (ref 70–99)
Glucose-Capillary: 91 mg/dL (ref 70–99)

## 2020-07-29 NOTE — Plan of Care (Signed)
  Problem: Education: Goal: Knowledge of disease or condition will improve Outcome: Progressing Goal: Knowledge of secondary prevention will improve Outcome: Progressing Goal: Knowledge of patient specific risk factors addressed and post discharge goals established will improve Outcome: Progressing Goal: Individualized Educational Video(s) Outcome: Progressing   Problem: Coping: Goal: Will verbalize positive feelings about self Outcome: Progressing Goal: Will identify appropriate support needs Outcome: Progressing   Problem: Health Behavior/Discharge Planning: Goal: Ability to manage health-related needs will improve Outcome: Progressing   Problem: Self-Care: Goal: Ability to participate in self-care as condition permits will improve Outcome: Progressing Goal: Verbalization of feelings and concerns over difficulty with self-care will improve Outcome: Progressing Goal: Ability to communicate needs accurately will improve Outcome: Progressing   Problem: Nutrition: Goal: Risk of aspiration will decrease Outcome: Progressing Goal: Dietary intake will improve Outcome: Progressing   Problem: Spontaneous Subarachnoid Hemorrhage Tissue Perfusion: Goal: Complications of Spontaneous Subarachnoid Hemorrhage will be minimized Outcome: Progressing   Problem: Education: Goal: Knowledge of General Education information will improve Description: Including pain rating scale, medication(s)/side effects and non-pharmacologic comfort measures Outcome: Progressing   Problem: Health Behavior/Discharge Planning: Goal: Ability to manage health-related needs will improve Outcome: Progressing   Problem: Clinical Measurements: Goal: Ability to maintain clinical measurements within normal limits will improve Outcome: Progressing Goal: Will remain free from infection Outcome: Progressing Goal: Diagnostic test results will improve Outcome: Progressing Goal: Respiratory complications will  improve Outcome: Progressing Goal: Cardiovascular complication will be avoided Outcome: Progressing   Problem: Activity: Goal: Risk for activity intolerance will decrease Outcome: Progressing   Problem: Nutrition: Goal: Adequate nutrition will be maintained Outcome: Progressing   Problem: Coping: Goal: Level of anxiety will decrease Outcome: Progressing   Problem: Elimination: Goal: Will not experience complications related to bowel motility Outcome: Progressing Goal: Will not experience complications related to urinary retention Outcome: Progressing   Problem: Pain Managment: Goal: General experience of comfort will improve Outcome: Progressing   Problem: Safety: Goal: Ability to remain free from injury will improve Outcome: Progressing   Problem: Skin Integrity: Goal: Risk for impaired skin integrity will decrease Outcome: Progressing   

## 2020-07-29 NOTE — Progress Notes (Signed)
  NEUROSURGERY PROGRESS NOTE   No issues overnight.   EXAM:  BP 133/90 (BP Location: Right Arm)   Pulse (!) 106   Temp 98.5 F (36.9 C) (Axillary)   Resp 20   Ht 5\' 4"  (1.626 m)   Wt 93.9 kg   SpO2 98%   BMI 35.53 kg/m   Awake Trach PERRL Purposeful LUE, W/D LLE to pain spontaneously KF RLE, 3/5. 1/5 RUE spontaenously ?followed commands   IMPRESSION/PLAN 60 y.o. female s/pruptured MCA and vasospasm s/p hemicraniectomy. Appears to be making some improvement. - continue supportive care - Dispo planning

## 2020-07-30 LAB — CBC WITH DIFFERENTIAL/PLATELET
Abs Immature Granulocytes: 0.03 10*3/uL (ref 0.00–0.07)
Basophils Absolute: 0.1 10*3/uL (ref 0.0–0.1)
Basophils Relative: 1 %
Eosinophils Absolute: 0.2 10*3/uL (ref 0.0–0.5)
Eosinophils Relative: 3 %
HCT: 37.2 % (ref 36.0–46.0)
Hemoglobin: 11.5 g/dL — ABNORMAL LOW (ref 12.0–15.0)
Immature Granulocytes: 1 %
Lymphocytes Relative: 26 %
Lymphs Abs: 1.7 10*3/uL (ref 0.7–4.0)
MCH: 27.9 pg (ref 26.0–34.0)
MCHC: 30.9 g/dL (ref 30.0–36.0)
MCV: 90.3 fL (ref 80.0–100.0)
Monocytes Absolute: 0.9 10*3/uL (ref 0.1–1.0)
Monocytes Relative: 13 %
Neutro Abs: 3.6 10*3/uL (ref 1.7–7.7)
Neutrophils Relative %: 56 %
Platelets: 292 10*3/uL (ref 150–400)
RBC: 4.12 MIL/uL (ref 3.87–5.11)
RDW: 17.2 % — ABNORMAL HIGH (ref 11.5–15.5)
WBC: 6.5 10*3/uL (ref 4.0–10.5)
nRBC: 0 % (ref 0.0–0.2)

## 2020-07-30 LAB — GLUCOSE, CAPILLARY
Glucose-Capillary: 106 mg/dL — ABNORMAL HIGH (ref 70–99)
Glucose-Capillary: 107 mg/dL — ABNORMAL HIGH (ref 70–99)
Glucose-Capillary: 113 mg/dL — ABNORMAL HIGH (ref 70–99)
Glucose-Capillary: 116 mg/dL — ABNORMAL HIGH (ref 70–99)
Glucose-Capillary: 136 mg/dL — ABNORMAL HIGH (ref 70–99)
Glucose-Capillary: 98 mg/dL (ref 70–99)

## 2020-07-30 MED ORDER — DIPHENHYDRAMINE HCL 12.5 MG/5ML PO ELIX
25.0000 mg | ORAL_SOLUTION | Freq: Two times a day (BID) | ORAL | Status: DC | PRN
  Administered 2020-07-30 – 2020-08-31 (×9): 25 mg
  Filled 2020-07-30 (×12): qty 10

## 2020-07-30 NOTE — Progress Notes (Signed)
Per handoff report,"pt itching throughout day shift in groin area (mons pubis and thighs)". Dr. Jake Samples informed and prn Benadryl requested this shift. See new order.

## 2020-07-30 NOTE — Progress Notes (Signed)
Routine trach tube change done.  A #6 uncuffed shiley flex trach tube was placed in the airway.  Placement confirmed by end-tidal co2 detector and presence of bilateral breath sounds.

## 2020-07-30 NOTE — Progress Notes (Signed)
   Providing Compassionate, Quality Care - Together   Subjective: Nurse reports no issues overnight.  Objective: Vital signs in last 24 hours: Temp:  [98.4 F (36.9 C)-99.4 F (37.4 C)] 99.4 F (37.4 C) (02/17 0822) Pulse Rate:  [95-110] 95 (02/17 0822) Resp:  [16-20] 17 (02/17 0822) BP: (117-146)/(71-97) 123/71 (02/17 0822) SpO2:  [95 %-100 %] 100 % (02/17 1112) FiO2 (%):  [21 %-28 %] 21 % (02/17 1112) Weight:  [95.2 kg] 95.2 kg (02/17 0348)  Intake/Output from previous day: 02/16 0701 - 02/17 0700 In: 2133 [NG/GT:2073; IV Piggyback:60] Out: 1200 [Urine:1100; Stool:100] Intake/Output this shift: No intake/output data recorded.  Alert, unable to follow commands PERRLA, left gaze preference, does track Trach Purposeful movement LUE, W/D to painful stimulus LLE Unable to elicit response in RUE, RLE   Lab Results: Recent Labs    07/29/20 0344 07/30/20 0344  WBC 7.6 6.5  HGB 11.3* 11.5*  HCT 35.7* 37.2  PLT 277 292   BMET No results for input(s): NA, K, CL, CO2, GLUCOSE, BUN, CREATININE, CALCIUM in the last 72 hours.  Studies/Results: No results found.  Assessment/Plan: Patient is 44 days status post coiling of LMCA aneurysm (06/16/2020) by Dr. Conchita Paris. She is 41 days status post left hemicraniectomy (06/19/2020) due to malignant cerebral edema. Postoperative course has been complicated by seizure activity. Tracheostomy placed on 07/01/2020.   LOS: 44 days    -Awaiting SNF placement -Continue supportive care   Val Eagle, DNP, AGNP-C Nurse Practitioner  [facility] [address], [address], [place], [place] [address] P: [phone]    F: [phone]  07/30/2020, 11:26 AM

## 2020-07-30 NOTE — Plan of Care (Signed)
  Problem: Education: Goal: Knowledge of disease or condition will improve Outcome: Progressing Goal: Knowledge of secondary prevention will improve Outcome: Progressing Goal: Knowledge of patient specific risk factors addressed and post discharge goals established will improve Outcome: Progressing Goal: Individualized Educational Video(s) Outcome: Progressing   Problem: Coping: Goal: Will verbalize positive feelings about self Outcome: Progressing Goal: Will identify appropriate support needs Outcome: Progressing   Problem: Health Behavior/Discharge Planning: Goal: Ability to manage health-related needs will improve Outcome: Progressing   Problem: Self-Care: Goal: Ability to participate in self-care as condition permits will improve Outcome: Progressing Goal: Verbalization of feelings and concerns over difficulty with self-care will improve Outcome: Progressing Goal: Ability to communicate needs accurately will improve Outcome: Progressing   Problem: Nutrition: Goal: Risk of aspiration will decrease Outcome: Progressing Goal: Dietary intake will improve Outcome: Progressing   Problem: Spontaneous Subarachnoid Hemorrhage Tissue Perfusion: Goal: Complications of Spontaneous Subarachnoid Hemorrhage will be minimized Outcome: Progressing   

## 2020-07-31 LAB — CBC WITH DIFFERENTIAL/PLATELET
Abs Immature Granulocytes: 0.03 10*3/uL (ref 0.00–0.07)
Basophils Absolute: 0.1 10*3/uL (ref 0.0–0.1)
Basophils Relative: 1 %
Eosinophils Absolute: 0.2 10*3/uL (ref 0.0–0.5)
Eosinophils Relative: 2 %
HCT: 36.5 % (ref 36.0–46.0)
Hemoglobin: 11.1 g/dL — ABNORMAL LOW (ref 12.0–15.0)
Immature Granulocytes: 0 %
Lymphocytes Relative: 27 %
Lymphs Abs: 1.8 10*3/uL (ref 0.7–4.0)
MCH: 27.5 pg (ref 26.0–34.0)
MCHC: 30.4 g/dL (ref 30.0–36.0)
MCV: 90.6 fL (ref 80.0–100.0)
Monocytes Absolute: 0.9 10*3/uL (ref 0.1–1.0)
Monocytes Relative: 14 %
Neutro Abs: 3.8 10*3/uL (ref 1.7–7.7)
Neutrophils Relative %: 56 %
Platelets: 248 10*3/uL (ref 150–400)
RBC: 4.03 MIL/uL (ref 3.87–5.11)
RDW: 17.2 % — ABNORMAL HIGH (ref 11.5–15.5)
WBC: 6.8 10*3/uL (ref 4.0–10.5)
nRBC: 0 % (ref 0.0–0.2)

## 2020-07-31 LAB — GLUCOSE, CAPILLARY
Glucose-Capillary: 106 mg/dL — ABNORMAL HIGH (ref 70–99)
Glucose-Capillary: 98 mg/dL (ref 70–99)
Glucose-Capillary: 94 mg/dL (ref 70–99)
Glucose-Capillary: 113 mg/dL — ABNORMAL HIGH (ref 70–99)
Glucose-Capillary: 89 mg/dL (ref 70–99)

## 2020-07-31 NOTE — Plan of Care (Signed)
  Problem: Education: Goal: Knowledge of disease or condition will improve Outcome: Progressing Goal: Knowledge of secondary prevention will improve Outcome: Progressing Goal: Knowledge of patient specific risk factors addressed and post discharge goals established will improve Outcome: Progressing Goal: Individualized Educational Video(s) Outcome: Progressing   Problem: Coping: Goal: Will verbalize positive feelings about self Outcome: Progressing Goal: Will identify appropriate support needs Outcome: Progressing   Problem: Health Behavior/Discharge Planning: Goal: Ability to manage health-related needs will improve Outcome: Progressing   Problem: Self-Care: Goal: Ability to participate in self-care as condition permits will improve Outcome: Progressing Goal: Verbalization of feelings and concerns over difficulty with self-care will improve Outcome: Progressing Goal: Ability to communicate needs accurately will improve Outcome: Progressing   Problem: Nutrition: Goal: Risk of aspiration will decrease Outcome: Progressing Goal: Dietary intake will improve Outcome: Progressing   Problem: Spontaneous Subarachnoid Hemorrhage Tissue Perfusion: Goal: Complications of Spontaneous Subarachnoid Hemorrhage will be minimized Outcome: Progressing   

## 2020-07-31 NOTE — Progress Notes (Signed)
Pt noted to have light blood in vagina area. No broken skin noted.

## 2020-07-31 NOTE — TOC Progression Note (Signed)
Transition of Care [facility]) - Progression Note    Patient Details  Name: Denise Savage MRN: [id number] Date of Birth: 24-Dec-1960  Transition of Care [facility]) CM/SW Contact  Baldemar Lenis, [place] Phone Number: 07/31/2020, 12:54 PM  Clinical Narrative:   CSW attempted to reach [facility] to ask if the LOG bed is still available for this patient when she's medically stable. Left a voicemail for Clydie Braun in Admissions, waiting on call back.     Expected Discharge Plan: Skilled Nursing Facility Barriers to Discharge: Continued Medical Work up,SNF Pending bed offer  Expected Discharge Plan and Services Expected Discharge Plan: Skilled Nursing Facility     Post Acute Care Choice: Skilled Nursing Facility Living arrangements for the past 2 months: Single Family Home                                       Social Determinants of Health (SDOH) Interventions    Readmission Risk Interventions No flowsheet data found.

## 2020-07-31 NOTE — Progress Notes (Signed)
   Providing Compassionate, Quality Care - Together   Subjective: No issues overnight  Objective: Vital signs in last 24 hours: Temp:  [98.4 F (36.9 C)-99.4 F (37.4 C)] 98.5 F (36.9 C) (02/18 0754) Pulse Rate:  [100-123] 100 (02/18 0800) Resp:  [18-25] 21 (02/18 0800) BP: (130-143)/(76-90) 134/81 (02/18 0754) SpO2:  [94 %-100 %] 100 % (02/18 0800) FiO2 (%):  [21 %-28 %] 21 % (02/18 0800) Weight:  [98.3 kg] 98.3 kg (02/18 0351)  Intake/Output from previous day: 02/17 0701 - 02/18 0700 In: -  Out: 100 [Stool:100] Intake/Output this shift: No intake/output data recorded.  Responds to voice, unable to follow commands PERRLA, left gaze preference, does track Trach Purposeful movement LUE, W/D to painful stimulus LLE Unable to elicit response in RUE, RLE  Lab Results: Recent Labs    07/30/20 0344 07/31/20 0328  WBC 6.5 6.8  HGB 11.5* 11.1*  HCT 37.2 36.5  PLT 292 248   BMET No results for input(s): NA, K, CL, CO2, GLUCOSE, BUN, CREATININE, CALCIUM in the last 72 hours.  Studies/Results: No results found.  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 Conchita Paris. She is 42 days status post left hemicraniectomy (06/19/2020) due to malignant cerebral edema. Postoperative course has been complicated by seizure activity. Tracheostomy placed on 07/01/2020.   LOS: 45 days    -Awaiting SNF placement -Continue supportive care   Val Eagle, DNP, AGNP-C Nurse Practitioner  [facility] [address], [place], [place] [address] P: [phone]    F: [phone]  07/31/2020, 11:19 AM

## 2020-08-01 LAB — GLUCOSE, CAPILLARY
Glucose-Capillary: 111 mg/dL — ABNORMAL HIGH (ref 70–99)
Glucose-Capillary: 80 mg/dL (ref 70–99)
Glucose-Capillary: 84 mg/dL (ref 70–99)
Glucose-Capillary: 90 mg/dL (ref 70–99)
Glucose-Capillary: 91 mg/dL (ref 70–99)
Glucose-Capillary: 96 mg/dL (ref 70–99)
Glucose-Capillary: 97 mg/dL (ref 70–99)

## 2020-08-01 LAB — CBC WITH DIFFERENTIAL/PLATELET
Abs Immature Granulocytes: 0.04 10*3/uL (ref 0.00–0.07)
Basophils Absolute: 0.1 10*3/uL (ref 0.0–0.1)
Basophils Relative: 1 %
Eosinophils Absolute: 0.2 10*3/uL (ref 0.0–0.5)
Eosinophils Relative: 3 %
HCT: 41.3 % (ref 36.0–46.0)
Hemoglobin: 12.7 g/dL (ref 12.0–15.0)
Immature Granulocytes: 1 %
Lymphocytes Relative: 23 %
Lymphs Abs: 1.5 10*3/uL (ref 0.7–4.0)
MCH: 27.7 pg (ref 26.0–34.0)
MCHC: 30.8 g/dL (ref 30.0–36.0)
MCV: 90.2 fL (ref 80.0–100.0)
Monocytes Absolute: 0.7 10*3/uL (ref 0.1–1.0)
Monocytes Relative: 11 %
Neutro Abs: 4 10*3/uL (ref 1.7–7.7)
Neutrophils Relative %: 61 %
Platelets: 187 10*3/uL (ref 150–400)
RBC: 4.58 MIL/uL (ref 3.87–5.11)
RDW: 17.2 % — ABNORMAL HIGH (ref 11.5–15.5)
WBC: 6.5 10*3/uL (ref 4.0–10.5)
nRBC: 0 % (ref 0.0–0.2)

## 2020-08-01 NOTE — Progress Notes (Signed)
Subjective: The patient is in no apparent distress.  Objective: Vital signs in last 24 hours: Temp:  [97.6 F (36.4 C)-98.7 F (37.1 C)] 97.6 F (36.4 C) (02/19 0751) Pulse Rate:  [81-106] 99 (02/19 0839) Resp:  [18-25] 18 (02/19 0839) BP: (116-149)/(77-89) 125/86 (02/19 0751) SpO2:  [93 %-100 %] 98 % (02/19 0839) FiO2 (%):  [21 %-28 %] 21 % (02/19 0839) Weight:  [98.3 kg] 98.3 kg (02/19 0453) Estimated body mass index is 37.2 kg/m as calculated from the following:   Height as of this encounter: 5\' 4"  (1.626 m).   Weight as of this encounter: 98.3 kg.   Intake/Output from previous day: 02/18 0701 - 02/19 0700 In: 255 [NG/GT:55] Out: 300 [Stool:300] Intake/Output this shift: Total I/O In: 110 [NG/GT:110] Out: -   Physical exam the patient is purposeful on the left.  She is right hemiplegic.  Her left scalp flap is ballotable and soft.  Her incision is well-healed.  Lab Results: Recent Labs    07/31/20 0328 08/01/20 0436  WBC 6.8 6.5  HGB 11.1* 12.7  HCT 36.5 41.3  PLT 248 187   BMET No results for input(s): NA, K, CL, CO2, GLUCOSE, BUN, CREATININE, CALCIUM in the last 72 hours.  Studies/Results: No results found.  Assessment/Plan: Stable, awaiting skilled nursing facility placement.  LOS: 46 days     08/03/20 08/01/2020, 10:59 AM

## 2020-08-01 NOTE — Plan of Care (Signed)
  Problem: Education: Goal: Knowledge of disease or condition will improve Outcome: Progressing Goal: Knowledge of secondary prevention will improve Outcome: Progressing Goal: Knowledge of patient specific risk factors addressed and post discharge goals established will improve Outcome: Progressing Goal: Individualized Educational Video(s) Outcome: Progressing   Problem: Coping: Goal: Will verbalize positive feelings about self Outcome: Progressing Goal: Will identify appropriate support needs Outcome: Progressing   Problem: Health Behavior/Discharge Planning: Goal: Ability to manage health-related needs will improve Outcome: Progressing   Problem: Self-Care: Goal: Ability to participate in self-care as condition permits will improve Outcome: Progressing Goal: Verbalization of feelings and concerns over difficulty with self-care will improve Outcome: Progressing Goal: Ability to communicate needs accurately will improve Outcome: Progressing   Problem: Nutrition: Goal: Risk of aspiration will decrease Outcome: Progressing Goal: Dietary intake will improve Outcome: Progressing   Problem: Spontaneous Subarachnoid Hemorrhage Tissue Perfusion: Goal: Complications of Spontaneous Subarachnoid Hemorrhage will be minimized Outcome: Progressing   

## 2020-08-01 NOTE — Plan of Care (Signed)
  Problem: Education: Goal: Knowledge of disease or condition will improve Outcome: Not Progressing Note: Patient remains confused and unable to participate in plan of care at this time. Goal: Knowledge of secondary prevention will improve Outcome: Not Progressing Goal: Knowledge of patient specific risk factors addressed and post discharge goals established will improve Outcome: Not Progressing Goal: Individualized Educational Video(s) Outcome: Not Progressing   Problem: Coping: Goal: Will verbalize positive feelings about self Outcome: Not Progressing Goal: Will identify appropriate support needs Outcome: Not Progressing   Problem: Health Behavior/Discharge Planning: Goal: Ability to manage health-related needs will improve Outcome: Not Progressing   Problem: Self-Care: Goal: Ability to participate in self-care as condition permits will improve Outcome: Not Progressing Goal: Verbalization of feelings and concerns over difficulty with self-care will improve Outcome: Not Progressing Goal: Ability to communicate needs accurately will improve Outcome: Not Progressing   Problem: Nutrition: Goal: Risk of aspiration will decrease Outcome: Not Progressing Goal: Dietary intake will improve Outcome: Not Progressing   Problem: Spontaneous Subarachnoid Hemorrhage Tissue Perfusion: Goal: Complications of Spontaneous Subarachnoid Hemorrhage will be minimized Outcome: Not Progressing   Problem: Education: Goal: Knowledge of General Education information will improve Description: Including pain rating scale, medication(s)/side effects and non-pharmacologic comfort measures Outcome: Not Progressing   Problem: Health Behavior/Discharge Planning: Goal: Ability to manage health-related needs will improve Outcome: Not Progressing   Problem: Clinical Measurements: Goal: Ability to maintain clinical measurements within normal limits will improve Outcome: Not Progressing Goal: Will  remain free from infection Outcome: Not Progressing Goal: Diagnostic test results will improve Outcome: Not Progressing Goal: Respiratory complications will improve Outcome: Not Progressing Goal: Cardiovascular complication will be avoided Outcome: Not Progressing   Problem: Activity: Goal: Risk for activity intolerance will decrease Outcome: Not Progressing   Problem: Nutrition: Goal: Adequate nutrition will be maintained Outcome: Not Progressing   Problem: Coping: Goal: Level of anxiety will decrease Outcome: Not Progressing   Problem: Elimination: Goal: Will not experience complications related to bowel motility Outcome: Not Progressing Goal: Will not experience complications related to urinary retention Outcome: Not Progressing   Problem: Pain Managment: Goal: General experience of comfort will improve Outcome: Not Progressing   Problem: Safety: Goal: Ability to remain free from injury will improve Outcome: Not Progressing   Problem: Skin Integrity: Goal: Risk for impaired skin integrity will decrease Outcome: Not Progressing

## 2020-08-02 LAB — GLUCOSE, CAPILLARY
Glucose-Capillary: 107 mg/dL — ABNORMAL HIGH (ref 70–99)
Glucose-Capillary: 85 mg/dL (ref 70–99)
Glucose-Capillary: 88 mg/dL (ref 70–99)
Glucose-Capillary: 89 mg/dL (ref 70–99)
Glucose-Capillary: 96 mg/dL (ref 70–99)

## 2020-08-02 LAB — CBC WITH DIFFERENTIAL/PLATELET
Abs Immature Granulocytes: 0.02 10*3/uL (ref 0.00–0.07)
Basophils Absolute: 0.1 10*3/uL (ref 0.0–0.1)
Basophils Relative: 1 %
Eosinophils Absolute: 0.2 10*3/uL (ref 0.0–0.5)
Eosinophils Relative: 3 %
HCT: 39.5 % (ref 36.0–46.0)
Hemoglobin: 12.6 g/dL (ref 12.0–15.0)
Immature Granulocytes: 0 %
Lymphocytes Relative: 28 %
Lymphs Abs: 1.5 10*3/uL (ref 0.7–4.0)
MCH: 28.8 pg (ref 26.0–34.0)
MCHC: 31.9 g/dL (ref 30.0–36.0)
MCV: 90.4 fL (ref 80.0–100.0)
Monocytes Absolute: 0.8 10*3/uL (ref 0.1–1.0)
Monocytes Relative: 14 %
Neutro Abs: 3 10*3/uL (ref 1.7–7.7)
Neutrophils Relative %: 54 %
Platelets: 212 10*3/uL (ref 150–400)
RBC: 4.37 MIL/uL (ref 3.87–5.11)
RDW: 17.1 % — ABNORMAL HIGH (ref 11.5–15.5)
WBC: 5.5 10*3/uL (ref 4.0–10.5)
nRBC: 0 % (ref 0.0–0.2)

## 2020-08-02 NOTE — Plan of Care (Signed)
  Problem: Nutrition: Goal: Risk of aspiration will decrease Outcome: Progressing Goal: Dietary intake will improve Outcome: Progressing   Problem: Spontaneous Subarachnoid Hemorrhage Tissue Perfusion: Goal: Complications of Spontaneous Subarachnoid Hemorrhage will be minimized Outcome: Progressing   Problem: Clinical Measurements: Goal: Ability to maintain clinical measurements within normal limits will improve Outcome: Progressing Goal: Will remain free from infection Outcome: Progressing Goal: Diagnostic test results will improve Outcome: Progressing Goal: Respiratory complications will improve Outcome: Progressing Goal: Cardiovascular complication will be avoided Outcome: Progressing   Problem: Activity: Goal: Risk for activity intolerance will decrease Outcome: Progressing   Problem: Nutrition: Goal: Adequate nutrition will be maintained Outcome: Progressing   Problem: Elimination: Goal: Will not experience complications related to bowel motility Outcome: Progressing Goal: Will not experience complications related to urinary retention Outcome: Progressing   Problem: Pain Managment: Goal: General experience of comfort will improve Outcome: Progressing   Problem: Safety: Goal: Ability to remain free from injury will improve Outcome: Progressing   Problem: Skin Integrity: Goal: Risk for impaired skin integrity will decrease Outcome: Progressing

## 2020-08-02 NOTE — Progress Notes (Signed)
   Providing Compassionate, Quality Care - Together   Subjective: No issues overnight  Objective: Vital signs in last 24 hours: Temp:  [97.4 F (36.3 C)-98.7 F (37.1 C)] 98.6 F (37 C) (02/20 0428) Pulse Rate:  [87-107] 87 (02/20 0822) Resp:  [16-21] 19 (02/20 0428) BP: (126-163)/(73-104) 126/84 (02/20 0428) SpO2:  [97 %-100 %] 98 % (02/20 0822) FiO2 (%):  [21 %-28 %] 21 % (02/20 0822) Weight:  [94.5 kg] 94.5 kg (02/20 0600)  Intake/Output from previous day: 02/19 0701 - 02/20 0700 In: 1818.4 [NG/GT:1755; IV Piggyback:63.4] Out: 550 [Stool:550] Intake/Output this shift: No intake/output data recorded.  Responds to voice, unable to follow commands PERRLA, left gaze preference, does track Trach Purposeful movement LUE, W/D to painful stimulus LLE Unable to elicit response in RUE, RLE  Lab Results: Recent Labs    08/01/20 0436 08/02/20 0453  WBC 6.5 5.5  HGB 12.7 12.6  HCT 41.3 39.5  PLT 187 212   BMET No results for input(s): NA, K, CL, CO2, GLUCOSE, BUN, CREATININE, CALCIUM in the last 72 hours.  Studies/Results: No results found.  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 Conchita Paris. She is 44 days status post left hemicraniectomy (06/19/2020) due to malignant cerebral edema. Postoperative course has been complicated by seizure activity. Tracheostomy placed on 07/01/2020.   LOS: 47 days    -Awaiting SNF placement -Continue supportive care   Val Eagle, DNP, AGNP-C Nurse Practitioner  [facility] [address], [place], [place] [address] P: [phone]    F: [phone]  08/02/2020, 9:20 AM

## 2020-08-03 LAB — CBC WITH DIFFERENTIAL/PLATELET
Abs Immature Granulocytes: 0.02 10*3/uL (ref 0.00–0.07)
Basophils Absolute: 0.1 10*3/uL (ref 0.0–0.1)
Basophils Relative: 1 %
Eosinophils Absolute: 0.2 10*3/uL (ref 0.0–0.5)
Eosinophils Relative: 3 %
HCT: 39.4 % (ref 36.0–46.0)
Hemoglobin: 12.5 g/dL (ref 12.0–15.0)
Immature Granulocytes: 0 %
Lymphocytes Relative: 28 %
Lymphs Abs: 1.5 10*3/uL (ref 0.7–4.0)
MCH: 28.8 pg (ref 26.0–34.0)
MCHC: 31.7 g/dL (ref 30.0–36.0)
MCV: 90.8 fL (ref 80.0–100.0)
Monocytes Absolute: 0.7 10*3/uL (ref 0.1–1.0)
Monocytes Relative: 13 %
Neutro Abs: 3 10*3/uL (ref 1.7–7.7)
Neutrophils Relative %: 55 %
Platelets: 209 10*3/uL (ref 150–400)
RBC: 4.34 MIL/uL (ref 3.87–5.11)
RDW: 17.2 % — ABNORMAL HIGH (ref 11.5–15.5)
WBC: 5.6 10*3/uL (ref 4.0–10.5)
nRBC: 0 % (ref 0.0–0.2)

## 2020-08-03 LAB — GLUCOSE, CAPILLARY
Glucose-Capillary: 103 mg/dL — ABNORMAL HIGH (ref 70–99)
Glucose-Capillary: 109 mg/dL — ABNORMAL HIGH (ref 70–99)
Glucose-Capillary: 114 mg/dL — ABNORMAL HIGH (ref 70–99)
Glucose-Capillary: 89 mg/dL (ref 70–99)
Glucose-Capillary: 95 mg/dL (ref 70–99)
Glucose-Capillary: 96 mg/dL (ref 70–99)

## 2020-08-03 MED ORDER — ONDANSETRON HCL 4 MG PO TABS: 4.0000 mg | ORAL_TABLET | ORAL | Status: DC | PRN

## 2020-08-03 MED ORDER — ONDANSETRON HCL 4 MG/2ML IJ SOLN: 4.0000 mg | INTRAMUSCULAR | Status: DC | PRN

## 2020-08-03 NOTE — Progress Notes (Signed)
   Providing Compassionate, Quality Care - Together   Subjective: No issues reported overnight.  Objective: Vital signs in last 24 hours: Temp:  [98.5 F (36.9 C)-98.8 F (37.1 C)] 98.8 F (37.1 C) (02/21 0554) Pulse Rate:  [88-106] 97 (02/21 1200) Resp:  [18-22] 19 (02/21 1200) BP: (119-131)/(80-92) 129/92 (02/21 1200) SpO2:  [96 %-100 %] 98 % (02/21 1200) FiO2 (%):  [21 %] 21 % (02/21 1100)  Intake/Output from previous day: 02/20 0701 - 02/21 0700 In: 745 [NG/GT:715; IV Piggyback:30] Out: -  Intake/Output this shift: No intake/output data recorded.  Responds to voice, unable to follow commands PERRLA, left gaze preference, does track Trach Purposeful movement LUE, W/D to painful stimulus LLE Unable to elicit response in RUE, RLE Left craniectomy scalp flap is concave and soft  Lab Results: Recent Labs    08/02/20 0453 08/03/20 0337  WBC 5.5 5.6  HGB 12.6 12.5  HCT 39.5 39.4  PLT 212 209   BMET No results for input(s): NA, K, CL, CO2, GLUCOSE, BUN, CREATININE, CALCIUM in the last 72 hours.  Studies/Results: No results found.  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 Conchita Paris. She is 44days status post left hemicraniectomy (06/19/2020) due to malignant cerebral edema. Postoperative course has been complicated by seizure activity. Tracheostomy placed on 07/01/2020.   LOS: 48 days    -Awaiting SNF placement -Continue supportive care  Val Eagle, DNP, AGNP-C Nurse Practitioner  [facility] [address], [address], [place], [place] [address] P: [phone]    F: [phone]  08/03/2020, 12:46 PM

## 2020-08-03 NOTE — Progress Notes (Signed)
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 +seizures; 1/7 Lt hemicraniectomy; 1/19 PEG and trach. PMH: ventral hernia repair    PT Comments    Pt does not follow commands or actively participate in PT session at this time. Pt does not consistently track PT visually and often appears to be staring through PT at this time. Pt does move all extremities purposefully, but does not to command. PT attempts verbal, visual, and tactile cueing during session. Pt been unable to follow commands or actively participate with acute PT services over 2 week trial periods. Family has been provided education on PROM in previous session. Acute PT services will sign off at this time as the pt is unable to participate. If the patient demonstrates the ability to follow commands during admission PT would be happy to re-evaluate.  Follow Up Recommendations  [facility]     Equipment Recommendations  Hospital bed;Wheelchair (measurements PT);Wheelchair cushion (measurements PT) (mechanical lift)    Recommendations for Other Services       Precautions / Restrictions Precautions Precautions: Fall;Other (comment) Precaution Comments: left hemi-craniectomy, PEG, trach Restrictions Weight Bearing Restrictions: No    Mobility  Bed Mobility Overal bed mobility: Needs Assistance Bed Mobility: Rolling;Supine to Sit;Sit to Supine Rolling: Total assist   Supine to sit: Total assist Sit to supine: Total assist   General bed mobility comments: pt does not assist in any bed  mobility on this date    Transfers Overall transfer level: Needs assistance Equipment used: 1 person hand held assist Transfers: Sit to/from Stand Sit to Stand: Total assist         General transfer comment: unable to complete stand but able to clear buttocks  Ambulation/Gait                 Stairs             Wheelchair Mobility    Modified Rankin (Stroke Patients Only) Modified Rankin (Stroke Patients Only) Pre-Morbid Rankin Score: No symptoms Modified Rankin: Severe disability     Balance Overall balance assessment: Needs assistance Sitting-balance support: Single extremity supported;Feet supported Sitting balance-Leahy Scale: Zero Sitting balance - Comments: maxA to maintain sitting Postural control: Posterior lean                                  Cognition Arousal/Alertness: Awake/alert Behavior During Therapy: Flat affect Overall Cognitive Status: Impaired/Different from baseline Area of Impairment: Attention;Following commands                   Current Attention Level: Focused   Following Commands:  (does not follow commands)       General Comments: pt does not follow any verbal commands. Does not follow any tactile or visual cues at this time. Often patient staring past PT and does not track PT this session consistently      Exercises      General Comments General comments (skin integrity, edema, etc.): no family present, pt on 5L 28% FiO2 trach collar      Pertinent  Vitals/Pain Pain Assessment: Faces Faces Pain Scale: Hurts little more Pain Location: grimacing with rolling Pain Descriptors / Indicators: Grimacing Pain Intervention(s): Monitored during session    Home Living                      Prior Function            PT Goals (current goals can now be found in the care plan section) Acute Rehab PT Goals Patient Stated Goal: none stated Progress towards PT goals: Not progressing toward  goals - comment (PT signing off 08/03/2020)    Frequency     (PT sign off 08/03/2020)      PT Plan Current plan remains appropriate    Co-evaluation              AM-PAC PT "6 Clicks" Mobility   Outcome Measure  Help needed turning from your back to your side while in a flat bed without using bedrails?: Total Help needed moving from lying on your back to sitting on the side of a flat bed without using bedrails?: Total Help needed moving to and from a bed to a chair (including a wheelchair)?: Total Help needed standing up from a chair using your arms (e.g., wheelchair or bedside chair)?: Total Help needed to walk in hospital room?: Total Help needed climbing 3-5 steps with a railing? : Total 6 Click Score: 6    End of Session Equipment Utilized During Treatment: Oxygen Activity Tolerance: Patient tolerated treatment well Patient left: in bed;with call bell/phone within reach;with bed alarm set Nurse Communication: Mobility status;Need for lift equipment PT Visit Diagnosis: Muscle weakness (generalized) (M62.81);Hemiplegia and hemiparesis Hemiplegia - Right/Left: Right Hemiplegia - dominant/non-dominant: Dominant Hemiplegia - caused by: Nontraumatic SAH     Time: 1312-1340 PT Time Calculation (min) (ACUTE ONLY): 28 min  Charges:  $Therapeutic Activity: 23-37 mins                     Arlyss Gandy, PT, DPT Acute Rehabilitation Pager: [phone]    Arlyss Gandy 08/03/2020, 2:10 PM

## 2020-08-04 LAB — GLUCOSE, CAPILLARY
Glucose-Capillary: 118 mg/dL — ABNORMAL HIGH (ref 70–99)
Glucose-Capillary: 100 mg/dL — ABNORMAL HIGH (ref 70–99)
Glucose-Capillary: 106 mg/dL — ABNORMAL HIGH (ref 70–99)
Glucose-Capillary: 107 mg/dL — ABNORMAL HIGH (ref 70–99)
Glucose-Capillary: 116 mg/dL — ABNORMAL HIGH (ref 70–99)

## 2020-08-04 LAB — CBC WITH DIFFERENTIAL/PLATELET
Abs Immature Granulocytes: 0.02 10*3/uL (ref 0.00–0.07)
Basophils Absolute: 0.1 10*3/uL (ref 0.0–0.1)
Basophils Relative: 1 %
Eosinophils Absolute: 0.2 10*3/uL (ref 0.0–0.5)
Eosinophils Relative: 3 %
HCT: 39.4 % (ref 36.0–46.0)
Hemoglobin: 12.4 g/dL (ref 12.0–15.0)
Immature Granulocytes: 0 %
Lymphocytes Relative: 22 %
Lymphs Abs: 1.2 10*3/uL (ref 0.7–4.0)
MCH: 28.5 pg (ref 26.0–34.0)
MCHC: 31.5 g/dL (ref 30.0–36.0)
MCV: 90.6 fL (ref 80.0–100.0)
Monocytes Absolute: 0.9 10*3/uL (ref 0.1–1.0)
Monocytes Relative: 15 %
Neutro Abs: 3.3 10*3/uL (ref 1.7–7.7)
Neutrophils Relative %: 59 %
Platelets: 197 10*3/uL (ref 150–400)
RBC: 4.35 MIL/uL (ref 3.87–5.11)
RDW: 17.1 % — ABNORMAL HIGH (ref 11.5–15.5)
WBC: 5.6 10*3/uL (ref 4.0–10.5)
nRBC: 0 % (ref 0.0–0.2)

## 2020-08-04 NOTE — Plan of Care (Signed)
  Problem: Education: Goal: Knowledge of disease or condition will improve Outcome: Progressing Goal: Knowledge of secondary prevention will improve Outcome: Progressing Goal: Knowledge of patient specific risk factors addressed and post discharge goals established will improve Outcome: Progressing Goal: Individualized Educational Video(s) Outcome: Progressing   Problem: Coping: Goal: Will identify appropriate support needs Outcome: Progressing   Problem: Nutrition: Goal: Risk of aspiration will decrease Outcome: Progressing Goal: Dietary intake will improve Outcome: Progressing   Problem: Spontaneous Subarachnoid Hemorrhage Tissue Perfusion: Goal: Complications of Spontaneous Subarachnoid Hemorrhage will be minimized Outcome: Progressing   Problem: Education: Goal: Knowledge of General Education information will improve Description: Including pain rating scale, medication(s)/side effects and non-pharmacologic comfort measures Outcome: Progressing   Problem: Health Behavior/Discharge Planning: Goal: Ability to manage health-related needs will improve Outcome: Progressing   Problem: Clinical Measurements: Goal: Ability to maintain clinical measurements within normal limits will improve Outcome: Progressing Goal: Will remain free from infection Outcome: Progressing Goal: Diagnostic test results will improve Outcome: Progressing Goal: Respiratory complications will improve Outcome: Progressing Goal: Cardiovascular complication will be avoided Outcome: Progressing   Problem: Nutrition: Goal: Adequate nutrition will be maintained Outcome: Progressing   Problem: Coping: Goal: Level of anxiety will decrease Outcome: Progressing   Problem: Elimination: Goal: Will not experience complications related to bowel motility Outcome: Progressing Goal: Will not experience complications related to urinary retention Outcome: Progressing   Problem: Pain Managment: Goal:  General experience of comfort will improve Outcome: Progressing   Problem: Safety: Goal: Ability to remain free from injury will improve Outcome: Progressing   Problem: Skin Integrity: Goal: Risk for impaired skin integrity will decrease Outcome: Progressing

## 2020-08-04 NOTE — Progress Notes (Signed)
Occupational Therapy Treatment Patient Details Name: Denise Savage MRN: [id number] DOB: 22-Jun-1960 Today's Date: 08/04/2020    History of present illness 60 year old female presented headache x 5 days found to have left sylvian fissure subarachnoid hemorrhage with a left MCA bifurcation aneurysm.  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 +seizures; 1/7 Lt hemicraniectomy; 1/19 PEG and trach. PMH: ventral hernia repair   OT comments  Patient met lying supine in bed. No family present at bedside. Patient with both eyes open upon entry appearing to watch TV. Upon further assessment, patient does not focus on TV but appears to look in general direction of sound. Patient continues to demonstrate spontaneous movement of all extremities but does not move in response to commands to do so. Patient unable to track therapist around room or follow 1-step verbal commands to complete simple tasks like giving this therapist a "thumbs up". Patient does turn head when OT attempts to wash face and can pull LUE away from painful stimuli. Patient initially picked up for trial to assess progress with therapy efforts but patient continues be limited by deficits below including profound cognitive deficits and strength deficits requiring Total A grossly for all self-care tasks at bed level. Given patient CLOF and decreased progress with therapy efforts, OT to sign off at this time. Please place new orders for therapy services if patient demonstrates ability to follow verbal commands.    Follow Up Recommendations  [facility] bed;Other (comment) Harrel Lemon lift)    Recommendations for Other Services      Precautions / Restrictions Precautions Precautions: Fall;Other (comment) Precaution Comments: left hemi-craniectomy, PEG,  trach Restrictions Weight Bearing Restrictions: No       Mobility Bed Mobility Overal bed mobility: Needs Assistance             General bed mobility comments: Continues to require Total A grossly for all parts of bed mobility. Patient does not attempt to assist.    Transfers                 General transfer comment: Not appropriate.    Balance                                           ADL either performed or assessed with clinical judgement   ADL                                         General ADL Comments: Continues to require Total A grossly for ADLs and all parts of bed mobility.     Vision       Perception     Praxis      Cognition Arousal/Alertness: Awake/alert Behavior During Therapy: Restless Overall Cognitive Status: Impaired/Different from baseline Area of Impairment: Attention;Following commands                   Current Attention Level: Focused   Following Commands:  (Does not follow commands)       General Comments: Patient does not follow verbal commands. Unable to actively participate with therapy efforts.        Exercises  Shoulder Instructions       General Comments No family present at bedside.    Pertinent Vitals/ Pain       Pain Assessment: Faces Faces Pain Scale: No hurt Pain Intervention(s): Monitored during session  Home Living                                          Prior Functioning/Environment              Frequency           Progress Toward Goals  OT Goals(current goals can now be found in the care plan section)  Progress towards OT goals: Not progressing toward goals - comment  Acute Rehab OT Goals Patient Stated Goal: Unable to state. OT Goal Formulation: Patient unable to participate in goal setting  Plan Other (comment) (OT to sign off at this time.)    Co-evaluation                 AM-PAC OT "6 Clicks" Daily  Activity     Outcome Measure   Help from another person eating meals?: Total (NPO w/ PEG) Help from another person taking care of personal grooming?: Total Help from another person toileting, which includes using toliet, bedpan, or urinal?: Total Help from another person bathing (including washing, rinsing, drying)?: Total Help from another person to put on and taking off regular upper body clothing?: Total Help from another person to put on and taking off regular lower body clothing?: Total 6 Click Score: 6    End of Session Equipment Utilized During Treatment: Oxygen  OT Visit Diagnosis: Other abnormalities of gait and mobility (R26.89);Muscle weakness (generalized) (M62.81);Feeding difficulties (R63.3);Other symptoms and signs involving cognitive function;Hemiplegia and hemiparesis;Pain Hemiplegia - Right/Left: Right Hemiplegia - dominant/non-dominant: Dominant Hemiplegia - caused by: Nontraumatic SAH   Activity Tolerance Other (comment) (Unable to actively participate with therapy efforts.)   Patient Left in bed;with call bell/phone within reach;with bed alarm set;with family/visitor present   Nurse Communication          Time: 1517-6160 OT Time Calculation (min): 15 min  Charges: OT General Charges $OT Visit: 1 Visit OT Treatments $Therapeutic Activity: 8-22 mins  Cassiel Fernandez H. OTR/L Supplemental OT, Department of rehab services [phone]   Sevon Rotert R H. 08/04/2020, 2:03 PM

## 2020-08-04 NOTE — Progress Notes (Signed)
  NEUROSURGERY PROGRESS NOTE   No issues overnight.   EXAM:  BP 131/86   Pulse (!) 110   Temp 98 F (36.7 C) (Axillary)   Resp 19   Ht 5\' 4"  (1.626 m)   Wt 94.2 kg   SpO2 98%   BMI 35.65 kg/m   Awake trach PERRL, tracks spontaneously moving LUE, LLE, RLE distally  IMPRESSION/PLAN 60 y.o. female s/pruptured MCA and vasospasm s/p hemicraniectomy. Appears to be making some improvement. - continue supportive care - Dispo planning

## 2020-08-04 NOTE — Progress Notes (Signed)
Nutrition Follow-up  DOCUMENTATION CODES:   Obesity unspecified  INTERVENTION:  Continue Tube Feeding via PEG:  Osmolite 1.2 @ 55 ml/hr (1320 ml) 90 ml ProSource BID  Provides: 1744 kcal, 117 grams protein, 208 grams carbohydrate, and 1070 ml free water.   200 ml free water every 6 hours Total free water: 1870 ml   NUTRITION DIAGNOSIS:   Inadequate oral intake related to inability to eat as evidenced by NPO status. Ongoing.   GOAL:   Patient will meet greater than or equal to 90% of their needs Met with TF.   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 Postoperative course complicated by seizure activity.   1/5 cortrak placed (tip gastric)  1/7 hemicraniectomy 1/19 s/p trach and PEG placement  2/8 trach changed to #6 shiley flex  Pt unresponsive to RD. Pt remains on TC. Awaiting SNF placement.   Pt continues to tolerate TF via PEG. Current orders: Osmolite 1.2 @ 75ml/hr with 88ml Prosource TF BID and 215ml free water flushes Q6H  UOP: 1246ml x24 hours  Labs and medications reviewed.   Diet Order:   Diet Order    None      EDUCATION NEEDS:   No education needs have been identified at this time  Skin:  Skin Assessment: Reviewed RN Assessment  Last BM:  2/22 type 5  Height:   Ht Readings from Last 1 Encounters:  06/30/20 $RemoveB'5\' 4"'zcHgpsCy$  (1.626 m)    Weight:   Wt Readings from Last 1 Encounters:  08/04/20 94.2 kg    Ideal Body Weight:  54.4 kg  BMI:  Body mass index is 35.65 kg/m.  Estimated Nutritional Needs:   Kcal:  1600-1800  Protein:  105-125 grams  Fluid:  >1.6 L/day  Larkin Ina, MS, RD, LDN RD pager number and weekend/on-call pager number located in [place].

## 2020-08-05 LAB — CBC WITH DIFFERENTIAL/PLATELET
Abs Immature Granulocytes: 0.05 10*3/uL (ref 0.00–0.07)
Basophils Absolute: 0.1 10*3/uL (ref 0.0–0.1)
Basophils Relative: 1 %
Eosinophils Absolute: 0.1 10*3/uL (ref 0.0–0.5)
Eosinophils Relative: 2 %
HCT: 39.8 % (ref 36.0–46.0)
Hemoglobin: 12.3 g/dL (ref 12.0–15.0)
Immature Granulocytes: 1 %
Lymphocytes Relative: 30 %
Lymphs Abs: 1.8 10*3/uL (ref 0.7–4.0)
MCH: 28.3 pg (ref 26.0–34.0)
MCHC: 30.9 g/dL (ref 30.0–36.0)
MCV: 91.5 fL (ref 80.0–100.0)
Monocytes Absolute: 0.9 10*3/uL (ref 0.1–1.0)
Monocytes Relative: 14 %
Neutro Abs: 3.1 10*3/uL (ref 1.7–7.7)
Neutrophils Relative %: 52 %
Platelets: 190 10*3/uL (ref 150–400)
RBC: 4.35 MIL/uL (ref 3.87–5.11)
RDW: 17.2 % — ABNORMAL HIGH (ref 11.5–15.5)
WBC: 5.9 10*3/uL (ref 4.0–10.5)
nRBC: 0 % (ref 0.0–0.2)

## 2020-08-05 LAB — GLUCOSE, CAPILLARY
Glucose-Capillary: 100 mg/dL — ABNORMAL HIGH (ref 70–99)
Glucose-Capillary: 102 mg/dL — ABNORMAL HIGH (ref 70–99)
Glucose-Capillary: 88 mg/dL (ref 70–99)
Glucose-Capillary: 90 mg/dL (ref 70–99)
Glucose-Capillary: 110 mg/dL — ABNORMAL HIGH (ref 70–99)
Glucose-Capillary: 104 mg/dL — ABNORMAL HIGH (ref 70–99)

## 2020-08-05 NOTE — TOC Progression Note (Signed)
Transition of Care [facility]) - Progression Note    Patient Details  Name: LITTIE CHIEM MRN: [id number] Date of Birth: 1960/12/12  Transition of Care [facility]) CM/SW Contact  Baldemar Lenis, [place] Phone Number: 08/05/2020, 3:40 PM  Clinical Narrative:   CSW following for SNF placement. CSW reached out to [place] about possibility of accepting patient under LOG. [facility] is not accepting LOG at this time, per Administration. No bed offers at this time. CSW to follow.    Expected Discharge Plan: Skilled Nursing Facility Barriers to Discharge: Continued Medical Work up,SNF Pending bed offer  Expected Discharge Plan and Services Expected Discharge Plan: Skilled Nursing Facility     Post Acute Care Choice: Skilled Nursing Facility Living arrangements for the past 2 months: Single Family Home                                       Social Determinants of Health (SDOH) Interventions    Readmission Risk Interventions No flowsheet data found.

## 2020-08-05 NOTE — Plan of Care (Signed)
  Problem: Education: Goal: Knowledge of disease or condition will improve Outcome: Progressing Goal: Knowledge of secondary prevention will improve Outcome: Progressing Goal: Knowledge of patient specific risk factors addressed and post discharge goals established will improve Outcome: Progressing Goal: Individualized Educational Video(s) Outcome: Progressing   Problem: Nutrition: Goal: Risk of aspiration will decrease Outcome: Progressing Goal: Dietary intake will improve Outcome: Progressing   Problem: Spontaneous Subarachnoid Hemorrhage Tissue Perfusion: Goal: Complications of Spontaneous Subarachnoid Hemorrhage will be minimized Outcome: Progressing   Problem: Education: Goal: Knowledge of General Education information will improve Description: Including pain rating scale, medication(s)/side effects and non-pharmacologic comfort measures Outcome: Progressing   Problem: Clinical Measurements: Goal: Ability to maintain clinical measurements within normal limits will improve Outcome: Progressing Goal: Will remain free from infection Outcome: Progressing Goal: Diagnostic test results will improve Outcome: Progressing Goal: Respiratory complications will improve Outcome: Progressing Goal: Cardiovascular complication will be avoided Outcome: Progressing   Problem: Nutrition: Goal: Adequate nutrition will be maintained Outcome: Progressing   Problem: Coping: Goal: Level of anxiety will decrease Outcome: Progressing   Problem: Elimination: Goal: Will not experience complications related to bowel motility Outcome: Progressing Goal: Will not experience complications related to urinary retention Outcome: Progressing   Problem: Pain Managment: Goal: General experience of comfort will improve Outcome: Progressing   Problem: Safety: Goal: Ability to remain free from injury will improve Outcome: Progressing   Problem: Skin Integrity: Goal: Risk for impaired skin  integrity will decrease Outcome: Progressing

## 2020-08-05 NOTE — Progress Notes (Signed)
  NEUROSURGERY PROGRESS NOTE   No issues overnight.   EXAM:  BP 108/62 (BP Location: Left Arm)   Pulse (!) 110   Temp 98.3 F (36.8 C) (Axillary)   Resp 20   Ht 5\' 4"  (1.626 m)   Wt 94.2 kg   SpO2 95%   BMI 35.65 kg/m   Awake trach PERRL, tracks spontaneously moving LUE, LLE, RLE   IMPRESSION/PLAN 60 y.o. female s/pruptured MCA and vasospasm s/p hemicraniectomy. Stable neurologically. - continue supportive care - Dispo planning

## 2020-08-06 LAB — CBC WITH DIFFERENTIAL/PLATELET
Abs Immature Granulocytes: 0.03 10*3/uL (ref 0.00–0.07)
Basophils Absolute: 0.1 10*3/uL (ref 0.0–0.1)
Basophils Relative: 1 %
Eosinophils Absolute: 0.1 10*3/uL (ref 0.0–0.5)
Eosinophils Relative: 2 %
HCT: 39.3 % (ref 36.0–46.0)
Hemoglobin: 11.9 g/dL — ABNORMAL LOW (ref 12.0–15.0)
Immature Granulocytes: 1 %
Lymphocytes Relative: 31 %
Lymphs Abs: 1.8 10*3/uL (ref 0.7–4.0)
MCH: 27.9 pg (ref 26.0–34.0)
MCHC: 30.3 g/dL (ref 30.0–36.0)
MCV: 92 fL (ref 80.0–100.0)
Monocytes Absolute: 0.8 10*3/uL (ref 0.1–1.0)
Monocytes Relative: 14 %
Neutro Abs: 3 10*3/uL (ref 1.7–7.7)
Neutrophils Relative %: 51 %
Platelets: 198 10*3/uL (ref 150–400)
RBC: 4.27 MIL/uL (ref 3.87–5.11)
RDW: 17.2 % — ABNORMAL HIGH (ref 11.5–15.5)
WBC: 5.9 10*3/uL (ref 4.0–10.5)
nRBC: 0 % (ref 0.0–0.2)

## 2020-08-06 LAB — GLUCOSE, CAPILLARY
Glucose-Capillary: 106 mg/dL — ABNORMAL HIGH (ref 70–99)
Glucose-Capillary: 110 mg/dL — ABNORMAL HIGH (ref 70–99)
Glucose-Capillary: 84 mg/dL (ref 70–99)
Glucose-Capillary: 84 mg/dL (ref 70–99)
Glucose-Capillary: 109 mg/dL — ABNORMAL HIGH (ref 70–99)
Glucose-Capillary: 99 mg/dL (ref 70–99)

## 2020-08-06 NOTE — Progress Notes (Signed)
  NEUROSURGERY PROGRESS NOTE   No issues overnight.   EXAM:  BP 135/79 (BP Location: Left Arm)   Pulse 90   Temp 98.8 F (37.1 C) (Axillary)   Resp 17   Ht 5\' 4"  (1.626 m)   Wt 98 kg   SpO2 98%   BMI 37.09 kg/m   Sleeping Awakened to pain trach PERRL Tracking Moves BLE to pain, purposeful LUE  IMPRESSION/PLAN 60 y.o. female s/pruptured MCA and vasospasm s/p hemicraniectomy. Stable neurologically. - continue supportive care - Dispo planning

## 2020-08-07 LAB — CBC WITH DIFFERENTIAL/PLATELET
Abs Immature Granulocytes: 0.02 10*3/uL (ref 0.00–0.07)
Basophils Absolute: 0.1 10*3/uL (ref 0.0–0.1)
Basophils Relative: 1 %
Eosinophils Absolute: 0.1 10*3/uL (ref 0.0–0.5)
Eosinophils Relative: 2 %
HCT: 36.9 % (ref 36.0–46.0)
Hemoglobin: 11.7 g/dL — ABNORMAL LOW (ref 12.0–15.0)
Immature Granulocytes: 0 %
Lymphocytes Relative: 26 %
Lymphs Abs: 1.5 10*3/uL (ref 0.7–4.0)
MCH: 28.7 pg (ref 26.0–34.0)
MCHC: 31.7 g/dL (ref 30.0–36.0)
MCV: 90.4 fL (ref 80.0–100.0)
Monocytes Absolute: 0.8 10*3/uL (ref 0.1–1.0)
Monocytes Relative: 15 %
Neutro Abs: 3.1 10*3/uL (ref 1.7–7.7)
Neutrophils Relative %: 56 %
Platelets: 181 10*3/uL (ref 150–400)
RBC: 4.08 MIL/uL (ref 3.87–5.11)
RDW: 17.2 % — ABNORMAL HIGH (ref 11.5–15.5)
WBC: 5.6 10*3/uL (ref 4.0–10.5)
nRBC: 0 % (ref 0.0–0.2)

## 2020-08-07 LAB — GLUCOSE, CAPILLARY
Glucose-Capillary: 127 mg/dL — ABNORMAL HIGH (ref 70–99)
Glucose-Capillary: 104 mg/dL — ABNORMAL HIGH (ref 70–99)
Glucose-Capillary: 98 mg/dL (ref 70–99)
Glucose-Capillary: 87 mg/dL (ref 70–99)
Glucose-Capillary: 96 mg/dL (ref 70–99)

## 2020-08-07 MED ORDER — CEPHALEXIN 250 MG/5ML PO SUSR
500.0000 mg | Freq: Three times a day (TID) | ORAL | Status: DC
  Administered 2020-08-07 – 2020-08-08 (×4): 500 mg via ORAL
  Filled 2020-08-07 (×5): qty 10

## 2020-08-07 MED ORDER — CHLORHEXIDINE GLUCONATE 0.12 % MT SOLN
OROMUCOSAL | Status: AC
  Administered 2020-08-07: 15 mL
  Filled 2020-08-07: qty 15

## 2020-08-07 NOTE — Progress Notes (Signed)
[phone] [name] Daughter would like a call an updated.

## 2020-08-07 NOTE — Progress Notes (Signed)
Pus oozing out from the healed craniotomy incision on pt's head.  Passed on to day shift RN to continue to monitor and  notify the rounding PA.  Patient is afebrile.

## 2020-08-07 NOTE — Progress Notes (Signed)
  NEUROSURGERY PROGRESS NOTE   No issues overnight. Some drainage noted from head overnight.  EXAM:  BP 118/75 (BP Location: Left Arm)   Pulse 97   Temp 98.4 F (36.9 C) (Axillary)   Resp 17   Ht 5\' 4"  (1.626 m)   Wt 96.3 kg   SpO2 98%   BMI 36.44 kg/m   Eyes open sponatneously Tracks Moving BLE and LUE spontaneously, ?flicker of spontaneous RUE movement Not following commands 3 tiny punctate openings at the anterior aspect of wound with some purulent drainage.  IMPRESSION:  60 y.o. 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 Small amount of purulent drainage from anterior aspect of craniectomy wound, likely superficial. Normal WBC, afebrile, unlikely to represent deeper/systemic infection  PLAN: - Will start Keflex x 7d - Cont supportive care   67, MD [facility]

## 2020-08-08 LAB — GLUCOSE, CAPILLARY
Glucose-Capillary: 101 mg/dL — ABNORMAL HIGH (ref 70–99)
Glucose-Capillary: 103 mg/dL — ABNORMAL HIGH (ref 70–99)
Glucose-Capillary: 105 mg/dL — ABNORMAL HIGH (ref 70–99)
Glucose-Capillary: 109 mg/dL — ABNORMAL HIGH (ref 70–99)
Glucose-Capillary: 94 mg/dL (ref 70–99)
Glucose-Capillary: 94 mg/dL (ref 70–99)

## 2020-08-08 LAB — CBC WITH DIFFERENTIAL/PLATELET
Abs Immature Granulocytes: 0.03 10*3/uL (ref 0.00–0.07)
Basophils Absolute: 0 10*3/uL (ref 0.0–0.1)
Basophils Relative: 1 %
Eosinophils Absolute: 0.1 10*3/uL (ref 0.0–0.5)
Eosinophils Relative: 2 %
HCT: 37.8 % (ref 36.0–46.0)
Hemoglobin: 12 g/dL (ref 12.0–15.0)
Immature Granulocytes: 1 %
Lymphocytes Relative: 26 %
Lymphs Abs: 1.6 10*3/uL (ref 0.7–4.0)
MCH: 28.9 pg (ref 26.0–34.0)
MCHC: 31.7 g/dL (ref 30.0–36.0)
MCV: 91.1 fL (ref 80.0–100.0)
Monocytes Absolute: 0.9 10*3/uL (ref 0.1–1.0)
Monocytes Relative: 14 %
Neutro Abs: 3.5 10*3/uL (ref 1.7–7.7)
Neutrophils Relative %: 56 %
Platelets: 186 10*3/uL (ref 150–400)
RBC: 4.15 MIL/uL (ref 3.87–5.11)
RDW: 16.9 % — ABNORMAL HIGH (ref 11.5–15.5)
WBC: 6.2 10*3/uL (ref 4.0–10.5)
nRBC: 0 % (ref 0.0–0.2)

## 2020-08-08 MED ORDER — CEPHALEXIN 250 MG/5ML PO SUSR
500.0000 mg | Freq: Three times a day (TID) | ORAL | Status: DC
  Administered 2020-08-08 – 2020-08-10 (×5): 500 mg
  Filled 2020-08-08 (×6): qty 10

## 2020-08-08 NOTE — Progress Notes (Signed)
Patient ID: Denise Savage, female   DOB: Jan 11, 1961, 59 y.o.   MRN: [id number] Vital signs are stable Patient remains on supportive care Level of function remains significantly diminished  Continue supportive care

## 2020-08-08 NOTE — Plan of Care (Signed)
  Problem: Education: Goal: Knowledge of disease or condition will improve Outcome: Progressing Goal: Knowledge of secondary prevention will improve Outcome: Progressing Goal: Knowledge of patient specific risk factors addressed and post discharge goals established will improve Outcome: Progressing Goal: Individualized Educational Video(s) Outcome: Progressing   Problem: Coping: Goal: Will verbalize positive feelings about self Outcome: Progressing Goal: Will identify appropriate support needs Outcome: Progressing   Problem: Health Behavior/Discharge Planning: Goal: Ability to manage health-related needs will improve Outcome: Progressing   Problem: Self-Care: Goal: Ability to participate in self-care as condition permits will improve Outcome: Progressing Goal: Verbalization of feelings and concerns over difficulty with self-care will improve Outcome: Progressing Goal: Ability to communicate needs accurately will improve Outcome: Progressing   Problem: Nutrition: Goal: Risk of aspiration will decrease Outcome: Progressing Goal: Dietary intake will improve Outcome: Progressing   Problem: Spontaneous Subarachnoid Hemorrhage Tissue Perfusion: Goal: Complications of Spontaneous Subarachnoid Hemorrhage will be minimized Outcome: Progressing   Problem: Education: Goal: Knowledge of General Education information will improve Description: Including pain rating scale, medication(s)/side effects and non-pharmacologic comfort measures Outcome: Progressing   Problem: Health Behavior/Discharge Planning: Goal: Ability to manage health-related needs will improve Outcome: Progressing   Problem: Clinical Measurements: Goal: Ability to maintain clinical measurements within normal limits will improve Outcome: Progressing Goal: Will remain free from infection Outcome: Progressing Goal: Diagnostic test results will improve Outcome: Progressing Goal: Respiratory complications will  improve Outcome: Progressing Goal: Cardiovascular complication will be avoided Outcome: Progressing   Problem: Activity: Goal: Risk for activity intolerance will decrease Outcome: Progressing   Problem: Nutrition: Goal: Adequate nutrition will be maintained Outcome: Progressing   Problem: Coping: Goal: Level of anxiety will decrease Outcome: Progressing   Problem: Elimination: Goal: Will not experience complications related to bowel motility Outcome: Progressing Goal: Will not experience complications related to urinary retention Outcome: Progressing   Problem: Pain Managment: Goal: General experience of comfort will improve Outcome: Progressing   Problem: Safety: Goal: Ability to remain free from injury will improve Outcome: Progressing   Problem: Skin Integrity: Goal: Risk for impaired skin integrity will decrease Outcome: Progressing

## 2020-08-08 NOTE — Plan of Care (Signed)
  Problem: Education: Goal: Knowledge of patient specific risk factors addressed and post discharge goals established will improve Outcome: Progressing   Problem: Education: Goal: Knowledge of secondary prevention will improve Outcome: Progressing   Problem: Education: Goal: Knowledge of disease or condition will improve Outcome: Progressing   

## 2020-08-09 LAB — CBC WITH DIFFERENTIAL/PLATELET
Abs Immature Granulocytes: 0.03 10*3/uL (ref 0.00–0.07)
Basophils Absolute: 0 10*3/uL (ref 0.0–0.1)
Basophils Relative: 0 %
Eosinophils Absolute: 0.2 10*3/uL (ref 0.0–0.5)
Eosinophils Relative: 3 %
HCT: 39.8 % (ref 36.0–46.0)
Hemoglobin: 12.3 g/dL (ref 12.0–15.0)
Immature Granulocytes: 1 %
Lymphocytes Relative: 26 %
Lymphs Abs: 1.6 10*3/uL (ref 0.7–4.0)
MCH: 28.3 pg (ref 26.0–34.0)
MCHC: 30.9 g/dL (ref 30.0–36.0)
MCV: 91.5 fL (ref 80.0–100.0)
Monocytes Absolute: 0.9 10*3/uL (ref 0.1–1.0)
Monocytes Relative: 14 %
Neutro Abs: 3.5 10*3/uL (ref 1.7–7.7)
Neutrophils Relative %: 56 %
Platelets: 173 10*3/uL (ref 150–400)
RBC: 4.35 MIL/uL (ref 3.87–5.11)
RDW: 16.8 % — ABNORMAL HIGH (ref 11.5–15.5)
WBC: 6.2 10*3/uL (ref 4.0–10.5)
nRBC: 0 % (ref 0.0–0.2)

## 2020-08-09 LAB — GLUCOSE, CAPILLARY
Glucose-Capillary: 104 mg/dL — ABNORMAL HIGH (ref 70–99)
Glucose-Capillary: 106 mg/dL — ABNORMAL HIGH (ref 70–99)
Glucose-Capillary: 78 mg/dL (ref 70–99)
Glucose-Capillary: 80 mg/dL (ref 70–99)
Glucose-Capillary: 85 mg/dL (ref 70–99)
Glucose-Capillary: 99 mg/dL (ref 70–99)

## 2020-08-09 NOTE — Progress Notes (Signed)
Patient ID: Denise Savage, female   DOB: 10-06-1960, 60 y.o.   MRN: [id number] Vital signs are stable no events overnight noted.  Patient continues to receive supportive care.  She has spontaneous eye opening but does not appear to follow commands.  Continues with supportive care

## 2020-08-09 NOTE — Plan of Care (Signed)
  Problem: Education: Goal: Knowledge of disease or condition will improve Outcome: Progressing Goal: Knowledge of secondary prevention will improve Outcome: Progressing Goal: Knowledge of patient specific risk factors addressed and post discharge goals established will improve Outcome: Progressing Goal: Individualized Educational Video(s) Outcome: Progressing   Problem: Coping: Goal: Will verbalize positive feelings about self Outcome: Progressing Goal: Will identify appropriate support needs Outcome: Progressing   Problem: Health Behavior/Discharge Planning: Goal: Ability to manage health-related needs will improve Outcome: Progressing   Problem: Self-Care: Goal: Ability to participate in self-care as condition permits will improve Outcome: Progressing Goal: Verbalization of feelings and concerns over difficulty with self-care will improve Outcome: Progressing Goal: Ability to communicate needs accurately will improve Outcome: Progressing   Problem: Nutrition: Goal: Risk of aspiration will decrease Outcome: Progressing Goal: Dietary intake will improve Outcome: Progressing   Problem: Spontaneous Subarachnoid Hemorrhage Tissue Perfusion: Goal: Complications of Spontaneous Subarachnoid Hemorrhage will be minimized Outcome: Progressing   Problem: Education: Goal: Knowledge of General Education information will improve Description: Including pain rating scale, medication(s)/side effects and non-pharmacologic comfort measures Outcome: Progressing   Problem: Health Behavior/Discharge Planning: Goal: Ability to manage health-related needs will improve Outcome: Progressing   Problem: Clinical Measurements: Goal: Ability to maintain clinical measurements within normal limits will improve Outcome: Progressing Goal: Will remain free from infection Outcome: Progressing Goal: Diagnostic test results will improve Outcome: Progressing Goal: Respiratory complications will  improve Outcome: Progressing Goal: Cardiovascular complication will be avoided Outcome: Progressing   Problem: Activity: Goal: Risk for activity intolerance will decrease Outcome: Progressing   Problem: Nutrition: Goal: Adequate nutrition will be maintained Outcome: Progressing   Problem: Coping: Goal: Level of anxiety will decrease Outcome: Progressing   Problem: Elimination: Goal: Will not experience complications related to bowel motility Outcome: Progressing Goal: Will not experience complications related to urinary retention Outcome: Progressing   Problem: Pain Managment: Goal: General experience of comfort will improve Outcome: Progressing   Problem: Safety: Goal: Ability to remain free from injury will improve Outcome: Progressing   Problem: Skin Integrity: Goal: Risk for impaired skin integrity will decrease Outcome: Progressing   

## 2020-08-10 LAB — CBC WITH DIFFERENTIAL/PLATELET
Abs Immature Granulocytes: 0.02 10*3/uL (ref 0.00–0.07)
Basophils Absolute: 0 10*3/uL (ref 0.0–0.1)
Basophils Relative: 0 %
Eosinophils Absolute: 0.2 10*3/uL (ref 0.0–0.5)
Eosinophils Relative: 3 %
HCT: 43.2 % (ref 36.0–46.0)
Hemoglobin: 13.1 g/dL (ref 12.0–15.0)
Immature Granulocytes: 0 %
Lymphocytes Relative: 27 %
Lymphs Abs: 1.5 10*3/uL (ref 0.7–4.0)
MCH: 28 pg (ref 26.0–34.0)
MCHC: 30.3 g/dL (ref 30.0–36.0)
MCV: 92.3 fL (ref 80.0–100.0)
Monocytes Absolute: 0.8 10*3/uL (ref 0.1–1.0)
Monocytes Relative: 14 %
Neutro Abs: 3.1 10*3/uL (ref 1.7–7.7)
Neutrophils Relative %: 56 %
Platelets: 145 10*3/uL — ABNORMAL LOW (ref 150–400)
RBC: 4.68 MIL/uL (ref 3.87–5.11)
RDW: 16.8 % — ABNORMAL HIGH (ref 11.5–15.5)
WBC: 5.6 10*3/uL (ref 4.0–10.5)
nRBC: 0 % (ref 0.0–0.2)

## 2020-08-10 LAB — GLUCOSE, CAPILLARY
Glucose-Capillary: 117 mg/dL — ABNORMAL HIGH (ref 70–99)
Glucose-Capillary: 87 mg/dL (ref 70–99)
Glucose-Capillary: 99 mg/dL (ref 70–99)
Glucose-Capillary: 126 mg/dL — ABNORMAL HIGH (ref 70–99)
Glucose-Capillary: 102 mg/dL — ABNORMAL HIGH (ref 70–99)
Glucose-Capillary: 113 mg/dL — ABNORMAL HIGH (ref 70–99)

## 2020-08-10 MED ORDER — SULFAMETHOXAZOLE-TRIMETHOPRIM 200-40 MG/5ML PO SUSP
20.0000 mL | Freq: Two times a day (BID) | ORAL | Status: AC
  Administered 2020-08-10 – 2020-08-19 (×20): 20 mL
  Filled 2020-08-10 (×20): qty 20

## 2020-08-10 NOTE — Progress Notes (Signed)
  NEUROSURGERY PROGRESS NOTE   No issues overnight.   EXAM:  BP (!) 146/88 (BP Location: Left Leg)   Pulse 95   Temp 98 F (36.7 C) (Axillary)   Resp 20   Ht 5\' 4"  (1.626 m)   Wt 99.8 kg   SpO2 100%   BMI 37.77 kg/m   Eyes open sponatneously PERRL, tracking Spontaneous movement BLE, LUE Not following commands 3 tiny punctate openings at the anterior aspect of wound. Still able to express some purulent discharge. No surrounding redness, streaking  IMPRESSION/PLAN 61 y.o. 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 Superficial wound infection. - continue supportive care - Will switch abx from keflex to bactrim due to persistent drainage. Remains no indication that this represents deeper/systemic infection.

## 2020-08-10 NOTE — Plan of Care (Signed)
  Problem: Safety: Goal: Ability to remain free from injury will improve Outcome: Progressing   Problem: Spontaneous Subarachnoid Hemorrhage Tissue Perfusion: Goal: Complications of Spontaneous Subarachnoid Hemorrhage will be minimized Outcome: Progressing

## 2020-08-11 ENCOUNTER — Inpatient Hospital Stay (HOSPITAL_COMMUNITY): Payer: Medicaid Other

## 2020-08-11 LAB — CBC WITH DIFFERENTIAL/PLATELET
Abs Immature Granulocytes: 0.04 10*3/uL (ref 0.00–0.07)
Basophils Absolute: 0 10*3/uL (ref 0.0–0.1)
Basophils Relative: 1 %
Eosinophils Absolute: 0.1 10*3/uL (ref 0.0–0.5)
Eosinophils Relative: 2 %
HCT: 38.4 % (ref 36.0–46.0)
Hemoglobin: 12.1 g/dL (ref 12.0–15.0)
Immature Granulocytes: 1 %
Lymphocytes Relative: 34 %
Lymphs Abs: 2.1 10*3/uL (ref 0.7–4.0)
MCH: 28.7 pg (ref 26.0–34.0)
MCHC: 31.5 g/dL (ref 30.0–36.0)
MCV: 91 fL (ref 80.0–100.0)
Monocytes Absolute: 0.9 10*3/uL (ref 0.1–1.0)
Monocytes Relative: 14 %
Neutro Abs: 3.1 10*3/uL (ref 1.7–7.7)
Neutrophils Relative %: 48 %
Platelets: 173 10*3/uL (ref 150–400)
RBC: 4.22 MIL/uL (ref 3.87–5.11)
RDW: 16.9 % — ABNORMAL HIGH (ref 11.5–15.5)
WBC: 6.3 10*3/uL (ref 4.0–10.5)
nRBC: 0 % (ref 0.0–0.2)

## 2020-08-11 LAB — GLUCOSE, CAPILLARY
Glucose-Capillary: 104 mg/dL — ABNORMAL HIGH (ref 70–99)
Glucose-Capillary: 115 mg/dL — ABNORMAL HIGH (ref 70–99)
Glucose-Capillary: 119 mg/dL — ABNORMAL HIGH (ref 70–99)
Glucose-Capillary: 84 mg/dL (ref 70–99)
Glucose-Capillary: 88 mg/dL (ref 70–99)
Glucose-Capillary: 95 mg/dL (ref 70–99)

## 2020-08-11 NOTE — Plan of Care (Signed)
Pt has a rash on her labia that needs to be addressed. Reported during shift change. PRN pain med was given because pt already had the prn benadryl.  Problem: Education: Goal: Knowledge of disease or condition will improve Outcome: Progressing Goal: Knowledge of secondary prevention will improve Outcome: Progressing Goal: Knowledge of patient specific risk factors addressed and post discharge goals established will improve Outcome: Progressing Goal: Individualized Educational Video(s) Outcome: Progressing   Problem: Coping: Goal: Will verbalize positive feelings about self Outcome: Progressing Goal: Will identify appropriate support needs Outcome: Progressing   Problem: Health Behavior/Discharge Planning: Goal: Ability to manage health-related needs will improve Outcome: Progressing   Problem: Self-Care: Goal: Ability to participate in self-care as condition permits will improve Outcome: Progressing Goal: Verbalization of feelings and concerns over difficulty with self-care will improve Outcome: Progressing Goal: Ability to communicate needs accurately will improve Outcome: Progressing   Problem: Nutrition: Goal: Risk of aspiration will decrease Outcome: Progressing Goal: Dietary intake will improve Outcome: Progressing   Problem: Spontaneous Subarachnoid Hemorrhage Tissue Perfusion: Goal: Complications of Spontaneous Subarachnoid Hemorrhage will be minimized Outcome: Progressing   Problem: Education: Goal: Knowledge of General Education information will improve Description: Including pain rating scale, medication(s)/side effects and non-pharmacologic comfort measures Outcome: Progressing   Problem: Health Behavior/Discharge Planning: Goal: Ability to manage health-related needs will improve Outcome: Progressing   Problem: Clinical Measurements: Goal: Ability to maintain clinical measurements within normal limits will improve Outcome: Progressing Goal: Will remain  free from infection Outcome: Progressing Goal: Diagnostic test results will improve Outcome: Progressing Goal: Respiratory complications will improve Outcome: Progressing Goal: Cardiovascular complication will be avoided Outcome: Progressing   Problem: Activity: Goal: Risk for activity intolerance will decrease Outcome: Progressing   Problem: Nutrition: Goal: Adequate nutrition will be maintained Outcome: Progressing   Problem: Coping: Goal: Level of anxiety will decrease Outcome: Progressing   Problem: Elimination: Goal: Will not experience complications related to bowel motility Outcome: Progressing Goal: Will not experience complications related to urinary retention Outcome: Progressing   Problem: Pain Managment: Goal: General experience of comfort will improve Outcome: Progressing   Problem: Safety: Goal: Ability to remain free from injury will improve Outcome: Progressing   Problem: Skin Integrity: Goal: Risk for impaired skin integrity will decrease Outcome: Progressing

## 2020-08-11 NOTE — Progress Notes (Signed)
Nutrition Follow-up  DOCUMENTATION CODES:   Obesity unspecified  INTERVENTION:  Continue Tube Feeding via PEG:  Osmolite 1.2 @ 55 ml/hr (1320 ml) 90 ml ProSource BID  Provides: 1744 kcal, 117 grams protein, 208 grams carbohydrate, and 1070 ml free water.   200 ml free water every 6 hours Total free water: 1870 ml   NUTRITION DIAGNOSIS:   Inadequate oral intake related to inability to eat as evidenced by NPO status. Ongoing.   GOAL:   Patient will meet greater than or equal to 90% of their needs Met with TF.   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 Postoperative course complicated by seizure activity.   1/5 cortrak placed (tip gastric)  1/7 hemicraniectomy 1/19 s/p trach and PEG placement  2/8 trach changed to #6 shiley flex  Pt unavailable at time of RD visit; pt care. Per Neurosurgery, level of function remains significantly diminished. Pt tracking, but not following commands. Anterior aspect of crani site with minimal purulent discharge expelled today. No surrounding erythema, streaking. Pt to have CT today to assess superficial wound infection.     Pt unresponsive to RD. Tolerating TC. SNF placement pending.    Pt continues to tolerate TF via PEG. Current orders: Osmolite 1.2 @ 24ml/hr with 28ml Prosource TF BID and 239ml free water flushes Q6H  UOP: 1818ml x24 hours  Admission weight: 100 kg Current weight: 96.8 kg  Suspect wt loss is 2/2 fluid shifts as wt fluctuations appear to mostly coincide with pt's edema. If wt loss continues, consider increasing kcals/protein provided by TF.   Labs and medications reviewed.  CBGs 61-443  Diet Order:   Diet Order    None      EDUCATION NEEDS:   No education needs have  been identified at this time  Skin:  Skin Assessment: Reviewed RN Assessment  Last BM:  2/28 via rectal tube  Height:   Ht Readings from Last 1 Encounters:  06/30/20 $RemoveB'5\' 4"'lSajlAYC$  (1.626 m)    Weight:   Wt Readings from Last 1 Encounters:  08/11/20 96.8 kg    Ideal Body Weight:  54.4 kg  BMI:  Body mass index is 36.63 kg/m.  Estimated Nutritional Needs:   Kcal:  1600-1800  Protein:  105-125 grams  Fluid:  >1.6 L/day  Larkin Ina, MS, RD, LDN RD pager number and weekend/on-call pager number located in [place].

## 2020-08-11 NOTE — Progress Notes (Signed)
  NEUROSURGERY PROGRESS NOTE   No issues overnight.   EXAM:  BP 133/76 (BP Location: Left Leg)   Pulse 90   Temp 98 F (36.7 C) (Oral)   Resp 15   Ht 5\' 4"  (1.626 m)   Wt 96.8 kg   SpO2 100%   BMI 36.63 kg/m   Sleeping when I entered. Easily awakened to voice Tracking, PERRL Spontaenously moving BLE, LUE. ?flicker RUE Not following commands  Anterior aspect of crani site with minimal purulent discharge expelled today. Appears there is a scabbed over area which is able to be flapped open. There is no underlying skin/tissue. No surrounding erythema, streaking.  IMPRESSION/PLAN 60 y.o. 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 Superficial wound infection, wound dehiscence. - CT head today to assess infection.  Continue bactrim

## 2020-08-11 NOTE — Plan of Care (Signed)
  Problem: Education: Goal: Knowledge of disease or condition will improve Outcome: Progressing Goal: Knowledge of secondary prevention will improve Outcome: Progressing Goal: Knowledge of patient specific risk factors addressed and post discharge goals established will improve Outcome: Progressing Goal: Individualized Educational Video(s) Outcome: Progressing   Problem: Coping: Goal: Will verbalize positive feelings about self Outcome: Progressing Goal: Will identify appropriate support needs Outcome: Progressing   Problem: Self-Care: Goal: Ability to participate in self-care as condition permits will improve Outcome: Progressing Goal: Verbalization of feelings and concerns over difficulty with self-care will improve Outcome: Progressing Goal: Ability to communicate needs accurately will improve Outcome: Progressing

## 2020-08-12 LAB — GLUCOSE, CAPILLARY
Glucose-Capillary: 115 mg/dL — ABNORMAL HIGH (ref 70–99)
Glucose-Capillary: 118 mg/dL — ABNORMAL HIGH (ref 70–99)
Glucose-Capillary: 121 mg/dL — ABNORMAL HIGH (ref 70–99)
Glucose-Capillary: 128 mg/dL — ABNORMAL HIGH (ref 70–99)
Glucose-Capillary: 92 mg/dL (ref 70–99)
Glucose-Capillary: 97 mg/dL (ref 70–99)

## 2020-08-12 LAB — CBC WITH DIFFERENTIAL/PLATELET
Abs Immature Granulocytes: 0.03 10*3/uL (ref 0.00–0.07)
Basophils Absolute: 0 10*3/uL (ref 0.0–0.1)
Basophils Relative: 1 %
Eosinophils Absolute: 0.2 10*3/uL (ref 0.0–0.5)
Eosinophils Relative: 2 %
HCT: 37.2 % (ref 36.0–46.0)
Hemoglobin: 12 g/dL (ref 12.0–15.0)
Immature Granulocytes: 1 %
Lymphocytes Relative: 29 %
Lymphs Abs: 1.8 10*3/uL (ref 0.7–4.0)
MCH: 29.3 pg (ref 26.0–34.0)
MCHC: 32.3 g/dL (ref 30.0–36.0)
MCV: 90.7 fL (ref 80.0–100.0)
Monocytes Absolute: 0.8 10*3/uL (ref 0.1–1.0)
Monocytes Relative: 13 %
Neutro Abs: 3.5 10*3/uL (ref 1.7–7.7)
Neutrophils Relative %: 54 %
Platelets: 180 10*3/uL (ref 150–400)
RBC: 4.1 MIL/uL (ref 3.87–5.11)
RDW: 16.7 % — ABNORMAL HIGH (ref 11.5–15.5)
WBC: 6.3 10*3/uL (ref 4.0–10.5)
nRBC: 0 % (ref 0.0–0.2)

## 2020-08-12 NOTE — Progress Notes (Signed)
  NEUROSURGERY PROGRESS NOTE   No issues overnight.   EXAM:  BP 134/81 (BP Location: Left Leg)   Pulse 93   Temp 98.3 F (36.8 C) (Axillary)   Resp 14   Ht 5\' 4"  (1.626 m)   Wt 94.3 kg   SpO2 100%   BMI 35.68 kg/m   Sleeping when I entered. Easily awakened to voice Tracking, PERRL Spontaenously moving BLE, LUE. ?flicker RUE Not following commands  Wound appears stable, minimal purulent drainage.  IMPRESSION/PLAN 60 y.o. 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 Superficial wound infection, wound dehiscence. Do not appreciate any deeper wound infection and CT yesterday stable without concern for underlying infection. Will cont to treat conservatively.  - Continue bactrim - Cont supportive care  66, MD [facility]

## 2020-08-12 NOTE — Plan of Care (Signed)
Pt is alert. Pt still itching on her labia area. Small pustules. Dsg down to her buttocks.  Problem: Education: Goal: Knowledge of disease or condition will improve Outcome: Progressing Goal: Knowledge of secondary prevention will improve Outcome: Progressing Goal: Knowledge of patient specific risk factors addressed and post discharge goals established will improve Outcome: Progressing Goal: Individualized Educational Video(s) Outcome: Progressing   Problem: Coping: Goal: Will verbalize positive feelings about self Outcome: Progressing Goal: Will identify appropriate support needs Outcome: Progressing   Problem: Health Behavior/Discharge Planning: Goal: Ability to manage health-related needs will improve Outcome: Progressing   Problem: Self-Care: Goal: Ability to participate in self-care as condition permits will improve Outcome: Progressing Goal: Verbalization of feelings and concerns over difficulty with self-care will improve Outcome: Progressing Goal: Ability to communicate needs accurately will improve Outcome: Progressing   Problem: Nutrition: Goal: Risk of aspiration will decrease Outcome: Progressing Goal: Dietary intake will improve Outcome: Progressing   Problem: Spontaneous Subarachnoid Hemorrhage Tissue Perfusion: Goal: Complications of Spontaneous Subarachnoid Hemorrhage will be minimized Outcome: Progressing   Problem: Education: Goal: Knowledge of General Education information will improve Description: Including pain rating scale, medication(s)/side effects and non-pharmacologic comfort measures Outcome: Progressing   Problem: Health Behavior/Discharge Planning: Goal: Ability to manage health-related needs will improve Outcome: Progressing   Problem: Clinical Measurements: Goal: Ability to maintain clinical measurements within normal limits will improve Outcome: Progressing Goal: Will remain free from infection Outcome: Progressing Goal:  Diagnostic test results will improve Outcome: Progressing Goal: Respiratory complications will improve Outcome: Progressing Goal: Cardiovascular complication will be avoided Outcome: Progressing   Problem: Activity: Goal: Risk for activity intolerance will decrease Outcome: Progressing   Problem: Nutrition: Goal: Adequate nutrition will be maintained Outcome: Progressing   Problem: Coping: Goal: Level of anxiety will decrease Outcome: Progressing   Problem: Elimination: Goal: Will not experience complications related to bowel motility Outcome: Progressing Goal: Will not experience complications related to urinary retention Outcome: Progressing   Problem: Pain Managment: Goal: General experience of comfort will improve Outcome: Progressing   Problem: Safety: Goal: Ability to remain free from injury will improve Outcome: Progressing   Problem: Skin Integrity: Goal: Risk for impaired skin integrity will decrease Outcome: Progressing

## 2020-08-12 NOTE — Plan of Care (Signed)

## 2020-08-13 LAB — CBC WITH DIFFERENTIAL/PLATELET
Abs Immature Granulocytes: 0.04 10*3/uL (ref 0.00–0.07)
Basophils Absolute: 0 10*3/uL (ref 0.0–0.1)
Basophils Relative: 1 %
Eosinophils Absolute: 0.1 10*3/uL (ref 0.0–0.5)
Eosinophils Relative: 2 %
HCT: 35.7 % — ABNORMAL LOW (ref 36.0–46.0)
Hemoglobin: 11.5 g/dL — ABNORMAL LOW (ref 12.0–15.0)
Immature Granulocytes: 1 %
Lymphocytes Relative: 26 %
Lymphs Abs: 1.7 10*3/uL (ref 0.7–4.0)
MCH: 28.8 pg (ref 26.0–34.0)
MCHC: 32.2 g/dL (ref 30.0–36.0)
MCV: 89.3 fL (ref 80.0–100.0)
Monocytes Absolute: 0.7 10*3/uL (ref 0.1–1.0)
Monocytes Relative: 11 %
Neutro Abs: 3.9 10*3/uL (ref 1.7–7.7)
Neutrophils Relative %: 59 %
Platelets: 139 10*3/uL — ABNORMAL LOW (ref 150–400)
RBC: 4 MIL/uL (ref 3.87–5.11)
RDW: 16.7 % — ABNORMAL HIGH (ref 11.5–15.5)
WBC: 6.5 10*3/uL (ref 4.0–10.5)
nRBC: 0 % (ref 0.0–0.2)

## 2020-08-13 LAB — GLUCOSE, CAPILLARY
Glucose-Capillary: 93 mg/dL (ref 70–99)
Glucose-Capillary: 117 mg/dL — ABNORMAL HIGH (ref 70–99)
Glucose-Capillary: 105 mg/dL — ABNORMAL HIGH (ref 70–99)
Glucose-Capillary: 103 mg/dL — ABNORMAL HIGH (ref 70–99)
Glucose-Capillary: 104 mg/dL — ABNORMAL HIGH (ref 70–99)

## 2020-08-13 NOTE — Progress Notes (Signed)
Due to increased HR >150s and an increase in BP, RRT will hold off on trach change today.

## 2020-08-13 NOTE — Progress Notes (Signed)
  NEUROSURGERY PROGRESS NOTE   No issues overnight.   EXAM:  BP (!) 145/83 (BP Location: Left Leg)   Pulse (!) 103   Temp 99.3 F (37.4 C) (Axillary)   Resp 20   Ht 5\' 4"  (1.626 m)   Wt 98.1 kg   SpO2 99%   BMI 37.12 kg/m   Sleeping when I entered. Easily awakened to voice Tracking, PERRL Spontaenously moving BLE, LUE. ?flicker RUE Not following commands  Wound appears stable, minimal purulent drainage.  IMPRESSION/PLAN 60 y.o. 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 Superficial wound infection, wound dehiscence  - Continue bactrim - Cont supportive care  67, MD [facility]

## 2020-08-14 LAB — CBC WITH DIFFERENTIAL/PLATELET
Abs Immature Granulocytes: 0.05 10*3/uL (ref 0.00–0.07)
Basophils Absolute: 0.1 10*3/uL (ref 0.0–0.1)
Basophils Relative: 1 %
Eosinophils Absolute: 0.2 10*3/uL (ref 0.0–0.5)
Eosinophils Relative: 3 %
HCT: 37.9 % (ref 36.0–46.0)
Hemoglobin: 11.7 g/dL — ABNORMAL LOW (ref 12.0–15.0)
Immature Granulocytes: 1 %
Lymphocytes Relative: 29 %
Lymphs Abs: 2 10*3/uL (ref 0.7–4.0)
MCH: 28.3 pg (ref 26.0–34.0)
MCHC: 30.9 g/dL (ref 30.0–36.0)
MCV: 91.8 fL (ref 80.0–100.0)
Monocytes Absolute: 0.9 10*3/uL (ref 0.1–1.0)
Monocytes Relative: 14 %
Neutro Abs: 3.7 10*3/uL (ref 1.7–7.7)
Neutrophils Relative %: 52 %
Platelets: 187 10*3/uL (ref 150–400)
RBC: 4.13 MIL/uL (ref 3.87–5.11)
RDW: 16.8 % — ABNORMAL HIGH (ref 11.5–15.5)
WBC: 6.9 10*3/uL (ref 4.0–10.5)
nRBC: 0 % (ref 0.0–0.2)

## 2020-08-14 LAB — GLUCOSE, CAPILLARY
Glucose-Capillary: 101 mg/dL — ABNORMAL HIGH (ref 70–99)
Glucose-Capillary: 108 mg/dL — ABNORMAL HIGH (ref 70–99)
Glucose-Capillary: 111 mg/dL — ABNORMAL HIGH (ref 70–99)
Glucose-Capillary: 114 mg/dL — ABNORMAL HIGH (ref 70–99)
Glucose-Capillary: 115 mg/dL — ABNORMAL HIGH (ref 70–99)
Glucose-Capillary: 86 mg/dL (ref 70–99)
Glucose-Capillary: 89 mg/dL (ref 70–99)
Glucose-Capillary: 90 mg/dL (ref 70–99)

## 2020-08-14 NOTE — Progress Notes (Signed)
  NEUROSURGERY PROGRESS NOTE   No issues overnight x episode of tachycardia yesterday am.   EXAM:  BP 109/63   Pulse 86   Temp 98.4 F (36.9 C) (Oral)   Resp (!) 21   Ht 5\' 4"  (1.626 m)   Wt 98.1 kg   SpO2 97%   BMI 37.12 kg/m   Sleeping but easily arouses BLE and LUE spontaneously, minimal movement RUE Wound stable without active drainage  IMPRESSION:  60 y.o. female s/p SAH and RMCA stroke requiring hemicraniectomy. Small superficial wound dehiscence appears stable  PLAN: - Cont Bactrim  - Cont supportive care   66, MD [facility]

## 2020-08-15 LAB — CBC WITH DIFFERENTIAL/PLATELET
Abs Immature Granulocytes: 0.04 10*3/uL (ref 0.00–0.07)
Basophils Absolute: 0 10*3/uL (ref 0.0–0.1)
Basophils Relative: 1 %
Eosinophils Absolute: 0.1 10*3/uL (ref 0.0–0.5)
Eosinophils Relative: 2 %
HCT: 37.3 % (ref 36.0–46.0)
Hemoglobin: 11.7 g/dL — ABNORMAL LOW (ref 12.0–15.0)
Immature Granulocytes: 1 %
Lymphocytes Relative: 27 %
Lymphs Abs: 1.7 10*3/uL (ref 0.7–4.0)
MCH: 28.5 pg (ref 26.0–34.0)
MCHC: 31.4 g/dL (ref 30.0–36.0)
MCV: 90.8 fL (ref 80.0–100.0)
Monocytes Absolute: 0.8 10*3/uL (ref 0.1–1.0)
Monocytes Relative: 12 %
Neutro Abs: 3.6 10*3/uL (ref 1.7–7.7)
Neutrophils Relative %: 57 %
Platelets: 187 10*3/uL (ref 150–400)
RBC: 4.11 MIL/uL (ref 3.87–5.11)
RDW: 16.8 % — ABNORMAL HIGH (ref 11.5–15.5)
WBC: 6.2 10*3/uL (ref 4.0–10.5)
nRBC: 0 % (ref 0.0–0.2)

## 2020-08-15 LAB — GLUCOSE, CAPILLARY
Glucose-Capillary: 116 mg/dL — ABNORMAL HIGH (ref 70–99)
Glucose-Capillary: 107 mg/dL — ABNORMAL HIGH (ref 70–99)
Glucose-Capillary: 110 mg/dL — ABNORMAL HIGH (ref 70–99)
Glucose-Capillary: 110 mg/dL — ABNORMAL HIGH (ref 70–99)
Glucose-Capillary: 118 mg/dL — ABNORMAL HIGH (ref 70–99)

## 2020-08-15 LAB — BASIC METABOLIC PANEL
Anion gap: 8 (ref 5–15)
BUN: 29 mg/dL — ABNORMAL HIGH (ref 6–20)
CO2: 25 mmol/L (ref 22–32)
Calcium: 10.9 mg/dL — ABNORMAL HIGH (ref 8.9–10.3)
Chloride: 100 mmol/L (ref 98–111)
Creatinine, Ser: 0.87 mg/dL (ref 0.44–1.00)
GFR, Estimated: >60 mL/min (ref >60)
Glucose, Bld: 103 mg/dL — ABNORMAL HIGH (ref 70–99)
Potassium: 4.7 mmol/L (ref 3.5–5.1)
Sodium: 133 mmol/L — ABNORMAL LOW (ref 135–145)

## 2020-08-15 LAB — PHOSPHORUS: Phosphorus: 3.6 mg/dL (ref 2.5–4.6)

## 2020-08-15 LAB — MAGNESIUM: Magnesium: 1.9 mg/dL (ref 1.7–2.4)

## 2020-08-15 NOTE — Progress Notes (Signed)
   Providing Compassionate, Quality Care - Together   Subjective: No issues reported overnight.  Objective: Vital signs in last 24 hours: Temp:  [98.4 F (36.9 C)-99.1 F (37.3 C)] 98.6 F (37 C) (03/05 0802) Pulse Rate:  [82-108] 99 (03/05 0859) Resp:  [13-19] 16 (03/05 0859) BP: (121-147)/(76-97) 147/76 (03/05 0802) SpO2:  [97 %-98 %] 97 % (03/05 0859) FiO2 (%):  [21 %] 21 % (03/05 0859) Weight:  [94.1 kg] 94.1 kg (03/05 0500)  Intake/Output from previous day: 03/04 0701 - 03/05 0700 In: 60 [IV Piggyback:60] Out: 401 [Urine:401] Intake/Output this shift: No intake/output data recorded.  Responds to voice, unable to follow commands PERRLA, left gaze preference, does track Trach Purposeful movement LUE, spontaneous movement BLE Unable to elicit response in RUE Left craniectomy scalp flap is concave and soft Small area of scabbing on the anterior portion of crani incision; no drainage noted  Lab Results: Recent Labs    08/14/20 0439 08/15/20 0247  WBC 6.9 6.2  HGB 11.7* 11.7*  HCT 37.9 37.3  PLT 187 187   BMET No results for input(s): NA, K, CL, CO2, GLUCOSE, BUN, CREATININE, CALCIUM in the last 72 hours.  Studies/Results: No results found.  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 Conchita Paris. 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 Postoperative course has been complicated by seizure activity. Tracheostomy placed on 07/01/2020.   LOS: 60 days    -Awaiting SNF placement -Continue Bactrim for surgical wound. Final dose 08/19/2020 -Continue supportive care   Val Eagle, DNP, AGNP-C Nurse Practitioner  [facility] [address], [place], [place] [address] P: [phone]    F: [phone]  08/15/2020, 9:42 AM

## 2020-08-15 NOTE — Progress Notes (Signed)
This was intended for the 8 o clock column

## 2020-08-16 LAB — CBC WITH DIFFERENTIAL/PLATELET
Abs Immature Granulocytes: 0.04 10*3/uL (ref 0.00–0.07)
Basophils Absolute: 0 10*3/uL (ref 0.0–0.1)
Basophils Relative: 1 %
Eosinophils Absolute: 0.2 10*3/uL (ref 0.0–0.5)
Eosinophils Relative: 2 %
HCT: 35.7 % — ABNORMAL LOW (ref 36.0–46.0)
Hemoglobin: 11.7 g/dL — ABNORMAL LOW (ref 12.0–15.0)
Immature Granulocytes: 1 %
Lymphocytes Relative: 19 %
Lymphs Abs: 1.2 10*3/uL (ref 0.7–4.0)
MCH: 29.4 pg (ref 26.0–34.0)
MCHC: 32.8 g/dL (ref 30.0–36.0)
MCV: 89.7 fL (ref 80.0–100.0)
Monocytes Absolute: 1 10*3/uL (ref 0.1–1.0)
Monocytes Relative: 16 %
Neutro Abs: 3.8 10*3/uL (ref 1.7–7.7)
Neutrophils Relative %: 61 %
Platelets: 196 10*3/uL (ref 150–400)
RBC: 3.98 MIL/uL (ref 3.87–5.11)
RDW: 16.8 % — ABNORMAL HIGH (ref 11.5–15.5)
WBC: 6.2 10*3/uL (ref 4.0–10.5)
nRBC: 0 % (ref 0.0–0.2)

## 2020-08-16 LAB — GLUCOSE, CAPILLARY
Glucose-Capillary: 104 mg/dL — ABNORMAL HIGH (ref 70–99)
Glucose-Capillary: 108 mg/dL — ABNORMAL HIGH (ref 70–99)
Glucose-Capillary: 110 mg/dL — ABNORMAL HIGH (ref 70–99)
Glucose-Capillary: 111 mg/dL — ABNORMAL HIGH (ref 70–99)
Glucose-Capillary: 113 mg/dL — ABNORMAL HIGH (ref 70–99)
Glucose-Capillary: 113 mg/dL — ABNORMAL HIGH (ref 70–99)
Glucose-Capillary: 85 mg/dL (ref 70–99)

## 2020-08-16 NOTE — Progress Notes (Signed)
   Providing Compassionate, Quality Care - Together   Subjective: Nurse reports no new events overnight. No further arrhythmias noted since yesterday's 14 beat run of v tach.  Objective: Vital signs in last 24 hours: Temp:  [99 F (37.2 C)-100.1 F (37.8 C)] 99.6 F (37.6 C) (03/06 0743) Pulse Rate:  [85-118] 91 (03/06 0743) Resp:  [15-24] 23 (03/06 0743) BP: (102-136)/(55-79) 136/76 (03/06 0743) SpO2:  [94 %-98 %] 94 % (03/06 0743) FiO2 (%):  [21 %] 21 % (03/05 1602) Weight:  [93.1 kg] 93.1 kg (03/06 0500)  Intake/Output from previous day: 03/05 0701 - 03/06 0700 In: -  Out: 2 [Urine:1; Stool:1] Intake/Output this shift: No intake/output data recorded.  Responds to voice, unable to follow commands PERRLA, left gaze preference, does track Trach Purposeful movement LUE, spontaneous movement BLE Unable to elicit response in RUE Left craniectomy scalp flap is concave and soft Small area of scabbing on the anterior portion of crani incision; no drainage noted  Lab Results: Recent Labs    08/15/20 0247 08/16/20 0300  WBC 6.2 6.2  HGB 11.7* 11.7*  HCT 37.3 35.7*  PLT 187 196   BMET Recent Labs    08/15/20 1621  NA 133*  K 4.7  CL 100  CO2 25  GLUCOSE 103*  BUN 29*  CREATININE 0.87  CALCIUM 10.9*    Studies/Results: No results found.  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 Conchita Paris. 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 Postoperative course has been complicated by seizure activity. Tracheostomy placed on 07/01/2020.   LOS: 61 days    -Awaiting SNFplacement -Continue Bactrim for surgical wound. Final dose 08/19/2020 -Continue supportive care    Val Eagle, DNP, AGNP-C Nurse Practitioner  [facility] [address], [address], [place], [place] [address] P: [phone]    F: [phone]  08/16/2020, 8:32  AM

## 2020-08-17 LAB — GLUCOSE, CAPILLARY
Glucose-Capillary: 78 mg/dL (ref 70–99)
Glucose-Capillary: 104 mg/dL — ABNORMAL HIGH (ref 70–99)
Glucose-Capillary: 109 mg/dL — ABNORMAL HIGH (ref 70–99)
Glucose-Capillary: 98 mg/dL (ref 70–99)
Glucose-Capillary: 101 mg/dL — ABNORMAL HIGH (ref 70–99)

## 2020-08-17 LAB — CBC WITH DIFFERENTIAL/PLATELET
Abs Immature Granulocytes: 0.06 10*3/uL (ref 0.00–0.07)
Basophils Absolute: 0 10*3/uL (ref 0.0–0.1)
Basophils Relative: 1 %
Eosinophils Absolute: 0.2 10*3/uL (ref 0.0–0.5)
Eosinophils Relative: 3 %
HCT: 38.8 % (ref 36.0–46.0)
Hemoglobin: 12.7 g/dL (ref 12.0–15.0)
Immature Granulocytes: 1 %
Lymphocytes Relative: 18 %
Lymphs Abs: 1 10*3/uL (ref 0.7–4.0)
MCH: 29.3 pg (ref 26.0–34.0)
MCHC: 32.7 g/dL (ref 30.0–36.0)
MCV: 89.4 fL (ref 80.0–100.0)
Monocytes Absolute: 1 10*3/uL (ref 0.1–1.0)
Monocytes Relative: 18 %
Neutro Abs: 3.1 10*3/uL (ref 1.7–7.7)
Neutrophils Relative %: 59 %
Platelets: 205 10*3/uL (ref 150–400)
RBC: 4.34 MIL/uL (ref 3.87–5.11)
RDW: 16.7 % — ABNORMAL HIGH (ref 11.5–15.5)
WBC: 5.3 10*3/uL (ref 4.0–10.5)
nRBC: 0 % (ref 0.0–0.2)

## 2020-08-17 MED ORDER — LACOSAMIDE 10 MG/ML PO SOLN
50.0000 mg | Freq: Two times a day (BID) | ORAL | Status: DC
  Administered 2020-08-17 – 2020-08-18 (×2): 50 mg via ORAL
  Filled 2020-08-17 (×2): qty 6

## 2020-08-17 NOTE — Progress Notes (Signed)
  NEUROSURGERY PROGRESS NOTE   No issues overnight.   EXAM:  BP 90/73 (BP Location: Left Leg)   Pulse 94   Temp 98.9 F (37.2 C) (Axillary)   Resp 18   Ht 5\' 4"  (1.626 m)   Wt 97.9 kg   SpO2 97%   BMI 37.05 kg/m   Sleeping, opens eyes to voice PERRL, tracks Trach Spontaneous movement LUE, BLE ?following commands intermittent with toes RLE Incision: c/d/i, prior area of wound dehiscence appears to be healing well. No drainage noted today  IMPRESSION/PLAN 60 y.o. female s/p SAH and RMCA stroke requiring hemicraniectomy. Small superficial wound dehiscence improving. - Cont Bactrim  - Cont supportive care - Dispo planning

## 2020-08-18 LAB — CBC WITH DIFFERENTIAL/PLATELET
Abs Immature Granulocytes: 0.07 10*3/uL (ref 0.00–0.07)
Basophils Absolute: 0 10*3/uL (ref 0.0–0.1)
Basophils Relative: 0 %
Eosinophils Absolute: 0.2 10*3/uL (ref 0.0–0.5)
Eosinophils Relative: 5 %
HCT: 37.7 % (ref 36.0–46.0)
Hemoglobin: 12.2 g/dL (ref 12.0–15.0)
Immature Granulocytes: 1 %
Lymphocytes Relative: 18 %
Lymphs Abs: 1 10*3/uL (ref 0.7–4.0)
MCH: 28.9 pg (ref 26.0–34.0)
MCHC: 32.4 g/dL (ref 30.0–36.0)
MCV: 89.3 fL (ref 80.0–100.0)
Monocytes Absolute: 1.1 10*3/uL — ABNORMAL HIGH (ref 0.1–1.0)
Monocytes Relative: 20 %
Neutro Abs: 3.1 10*3/uL (ref 1.7–7.7)
Neutrophils Relative %: 56 %
Platelets: 209 10*3/uL (ref 150–400)
RBC: 4.22 MIL/uL (ref 3.87–5.11)
RDW: 16.9 % — ABNORMAL HIGH (ref 11.5–15.5)
WBC: 5.4 10*3/uL (ref 4.0–10.5)
nRBC: 0 % (ref 0.0–0.2)

## 2020-08-18 LAB — GLUCOSE, CAPILLARY
Glucose-Capillary: 111 mg/dL — ABNORMAL HIGH (ref 70–99)
Glucose-Capillary: 111 mg/dL — ABNORMAL HIGH (ref 70–99)
Glucose-Capillary: 112 mg/dL — ABNORMAL HIGH (ref 70–99)
Glucose-Capillary: 115 mg/dL — ABNORMAL HIGH (ref 70–99)
Glucose-Capillary: 115 mg/dL — ABNORMAL HIGH (ref 70–99)
Glucose-Capillary: 116 mg/dL — ABNORMAL HIGH (ref 70–99)
Glucose-Capillary: 97 mg/dL (ref 70–99)

## 2020-08-18 MED ORDER — LACOSAMIDE 10 MG/ML PO SOLN
50.0000 mg | Freq: Two times a day (BID) | ORAL | Status: DC
  Administered 2020-08-18 – 2020-09-10 (×46): 50 mg
  Filled 2020-08-18 (×46): qty 6

## 2020-08-18 NOTE — Progress Notes (Signed)
Nutrition Follow-up  DOCUMENTATION CODES:   Obesity unspecified  INTERVENTION:  Continue Tube Feeding via PEG:  Osmolite 1.2 @ 55 ml/hr (1320 ml) 90 ml ProSource BID  Provides: 1744 kcal, 117 grams protein, 208 grams carbohydrate, and 1070 ml free water.   200 ml free water every 6 hours Total free water: 1870 ml   NUTRITION DIAGNOSIS:   Inadequate oral intake related to inability to eat as evidenced by NPO status. Ongoing.   GOAL:   Patient will meet greater than or equal to 90% of their needs Met with TF.   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 Postoperative course complicated by seizure activity.   1/5 cortrak placed (tip gastric)  1/7 hemicraniectomy 1/19 s/p trach and PEG placement  2/8 trach changed to #6 shiley flex  Pt mostly unresponsive to RD, though is tracking.Tolerating TC. SNF placement pending. Per Neurosurgery, prior area of wound dehiscence appears to be healing well; no drainage noted today.   Pt continues to tolerate TF via PEG. Current orders: Osmolite 1.2 @ 73ml/hr with 98ml Prosource TF BID and 211ml free water flushes Q6H  No UOP documented.   Admission weight: 100 kg Current weight: 93.8 kg  Wt fluctuations continue, likely 2/2 fluid shifts. Per RN assessment, pt with mild pitting edema to BLE.   Labs and medications reviewed.  CBGs 112-116-97  Diet Order:   Diet Order    None      EDUCATION NEEDS:   No education needs have been identified at this time  Skin:  Skin Assessment: Reviewed RN Assessment  Last BM:  3/5  Height:   Ht Readings from Last 1 Encounters:  06/30/20 $RemoveB'5\' 4"'GrCLEwOf$  (1.626 m)    Weight:   Wt Readings from Last 1 Encounters:  08/18/20 93.8 kg    Ideal Body Weight:   54.4 kg  BMI:  Body mass index is 35.5 kg/m.  Estimated Nutritional Needs:   Kcal:  1600-1800  Protein:  105-125 grams  Fluid:  >1.6 L/day  Larkin Ina, MS, RD, LDN RD pager number and weekend/on-call pager number located in [place].

## 2020-08-18 NOTE — Progress Notes (Signed)
  NEUROSURGERY PROGRESS NOTE   No issues overnight.   EXAM:  BP 129/90 (BP Location: Left Leg)   Pulse (!) 104   Temp 100 F (37.8 C) (Axillary)   Resp (!) 24   Ht 5\' 4"  (1.626 m)   Wt 93.8 kg   SpO2 98%   BMI 35.50 kg/m   Awake, trach PERRL, tracks Spontaneous movement LUE, BLE Not follow commands Incision: c/d/i, prior area of wound dehiscence appears to be healing well - area is scabbed over. No drainag. No surrounding redness  IMPRESSION/PLAN 60 y.o. female s/p SAH and RMCA stroke requiring hemicraniectomy. Small superficial wound dehiscence improving. -Cont Bactrim  - Cont supportive care - Dispo planning

## 2020-08-19 LAB — CBC WITH DIFFERENTIAL/PLATELET
Abs Immature Granulocytes: 0.05 10*3/uL (ref 0.00–0.07)
Basophils Absolute: 0 10*3/uL (ref 0.0–0.1)
Basophils Relative: 0 %
Eosinophils Absolute: 0.3 10*3/uL (ref 0.0–0.5)
Eosinophils Relative: 8 %
HCT: 36.6 % (ref 36.0–46.0)
Hemoglobin: 11.5 g/dL — ABNORMAL LOW (ref 12.0–15.0)
Immature Granulocytes: 1 %
Lymphocytes Relative: 20 %
Lymphs Abs: 0.8 10*3/uL (ref 0.7–4.0)
MCH: 28.5 pg (ref 26.0–34.0)
MCHC: 31.4 g/dL (ref 30.0–36.0)
MCV: 90.6 fL (ref 80.0–100.0)
Monocytes Absolute: 0.9 10*3/uL (ref 0.1–1.0)
Monocytes Relative: 22 %
Neutro Abs: 2 10*3/uL (ref 1.7–7.7)
Neutrophils Relative %: 49 %
Platelets: 178 10*3/uL (ref 150–400)
RBC: 4.04 MIL/uL (ref 3.87–5.11)
RDW: 16.8 % — ABNORMAL HIGH (ref 11.5–15.5)
WBC: 4.2 10*3/uL (ref 4.0–10.5)
nRBC: 0.5 % — ABNORMAL HIGH (ref 0.0–0.2)

## 2020-08-19 LAB — GLUCOSE, CAPILLARY
Glucose-Capillary: 99 mg/dL (ref 70–99)
Glucose-Capillary: 108 mg/dL — ABNORMAL HIGH (ref 70–99)
Glucose-Capillary: 101 mg/dL — ABNORMAL HIGH (ref 70–99)
Glucose-Capillary: 109 mg/dL — ABNORMAL HIGH (ref 70–99)
Glucose-Capillary: 97 mg/dL (ref 70–99)
Glucose-Capillary: 127 mg/dL — ABNORMAL HIGH (ref 70–99)

## 2020-08-19 NOTE — Progress Notes (Signed)
  NEUROSURGERY PROGRESS NOTE   No issues overnight  EXAM:  BP 115/67 (BP Location: Left Leg)   Pulse 93   Temp 98.9 F (37.2 C) (Axillary)   Resp (!) 21   Ht 5\' 4"  (1.626 m)   Wt 93.8 kg   SpO2 94%   BMI 35.50 kg/m   Awake, trach PERRL, tracks Spontaneous movement LUE, BLE Not follow commands Incision: c/d/i, prior area of wound dehiscence appears to be healing well - area is scabbed over. No drainage. No surrounding redness  IMPRESSION/PLAN 59 y.o.females/p SAH and RMCA stroke requiring hemicraniectomy. Small superficial wound dehiscenceimproving. -Cont Bactrim - last day. Continue to monitor wound - Cont supportive care - Dispo planning

## 2020-08-20 LAB — GLUCOSE, CAPILLARY
Glucose-Capillary: 110 mg/dL — ABNORMAL HIGH (ref 70–99)
Glucose-Capillary: 99 mg/dL (ref 70–99)
Glucose-Capillary: 101 mg/dL — ABNORMAL HIGH (ref 70–99)
Glucose-Capillary: 102 mg/dL — ABNORMAL HIGH (ref 70–99)
Glucose-Capillary: 111 mg/dL — ABNORMAL HIGH (ref 70–99)

## 2020-08-20 LAB — CBC WITH DIFFERENTIAL/PLATELET
Abs Immature Granulocytes: 0.04 10*3/uL (ref 0.00–0.07)
Basophils Absolute: 0 10*3/uL (ref 0.0–0.1)
Basophils Relative: 0 %
Eosinophils Absolute: 0.4 10*3/uL (ref 0.0–0.5)
Eosinophils Relative: 9 %
HCT: 35.7 % — ABNORMAL LOW (ref 36.0–46.0)
Hemoglobin: 11.7 g/dL — ABNORMAL LOW (ref 12.0–15.0)
Immature Granulocytes: 1 %
Lymphocytes Relative: 23 %
Lymphs Abs: 1 10*3/uL (ref 0.7–4.0)
MCH: 29.5 pg (ref 26.0–34.0)
MCHC: 32.8 g/dL (ref 30.0–36.0)
MCV: 89.9 fL (ref 80.0–100.0)
Monocytes Absolute: 0.8 10*3/uL (ref 0.1–1.0)
Monocytes Relative: 19 %
Neutro Abs: 2 10*3/uL (ref 1.7–7.7)
Neutrophils Relative %: 48 %
Platelets: 181 10*3/uL (ref 150–400)
RBC: 3.97 MIL/uL (ref 3.87–5.11)
RDW: 16.7 % — ABNORMAL HIGH (ref 11.5–15.5)
WBC: 4.2 10*3/uL (ref 4.0–10.5)
nRBC: 0 % (ref 0.0–0.2)

## 2020-08-20 NOTE — Progress Notes (Signed)
°  NEUROSURGERY PROGRESS NOTE   No issues overnight.   EXAM:  BP 138/68 (BP Location: Right Leg)    Pulse 91    Temp 97.8 F (36.6 C) (Oral)    Resp (!) 21    Ht 5\' 4"  (1.626 m)    Wt 94 kg    SpO2 94%    BMI 35.57 kg/m   Sleeping, awakened to pain Trach PERRL, tracks Spontaneous movement LUE, BLE Not follow commands Incision: c/d/i, No evidence of infection  IMPRESSION/PLAN 59 y.o.females/p SAH and RMCA stroke requiring hemicraniectomy.  Small superficial wound dehiscencehealed. no further signs of infection. Completed 10 day course of Bactrim. - Cont supportive care - Dispo planning

## 2020-08-20 NOTE — Progress Notes (Signed)
ATC setup changed 

## 2020-08-21 LAB — CBC WITH DIFFERENTIAL/PLATELET
Abs Immature Granulocytes: 0.04 10*3/uL (ref 0.00–0.07)
Basophils Absolute: 0 10*3/uL (ref 0.0–0.1)
Basophils Relative: 1 %
Eosinophils Absolute: 0.4 10*3/uL (ref 0.0–0.5)
Eosinophils Relative: 10 %
HCT: 34.4 % — ABNORMAL LOW (ref 36.0–46.0)
Hemoglobin: 11.2 g/dL — ABNORMAL LOW (ref 12.0–15.0)
Immature Granulocytes: 1 %
Lymphocytes Relative: 33 %
Lymphs Abs: 1.4 10*3/uL (ref 0.7–4.0)
MCH: 29.2 pg (ref 26.0–34.0)
MCHC: 32.6 g/dL (ref 30.0–36.0)
MCV: 89.6 fL (ref 80.0–100.0)
Monocytes Absolute: 1 10*3/uL (ref 0.1–1.0)
Monocytes Relative: 22 %
Neutro Abs: 1.5 10*3/uL — ABNORMAL LOW (ref 1.7–7.7)
Neutrophils Relative %: 33 %
Platelets: 158 10*3/uL (ref 150–400)
RBC: 3.84 MIL/uL — ABNORMAL LOW (ref 3.87–5.11)
RDW: 16.6 % — ABNORMAL HIGH (ref 11.5–15.5)
WBC: 4.4 10*3/uL (ref 4.0–10.5)
nRBC: 0 % (ref 0.0–0.2)

## 2020-08-21 LAB — GLUCOSE, CAPILLARY
Glucose-Capillary: 100 mg/dL — ABNORMAL HIGH (ref 70–99)
Glucose-Capillary: 108 mg/dL — ABNORMAL HIGH (ref 70–99)
Glucose-Capillary: 114 mg/dL — ABNORMAL HIGH (ref 70–99)
Glucose-Capillary: 114 mg/dL — ABNORMAL HIGH (ref 70–99)
Glucose-Capillary: 122 mg/dL — ABNORMAL HIGH (ref 70–99)
Glucose-Capillary: 89 mg/dL (ref 70–99)
Glucose-Capillary: 98 mg/dL (ref 70–99)

## 2020-08-21 NOTE — Progress Notes (Signed)
  NEUROSURGERY PROGRESS NOTE   No issues overnight.  EXAM:  BP 114/83 (BP Location: Right Leg)   Pulse 89   Temp 99.7 F (37.6 C) (Axillary)   Resp 20   Ht 5\' 4"  (1.626 m)   Wt 93.1 kg   SpO2 100%   BMI 35.23 kg/m   Awake, alert Trach PERRL, tracks Spontaneous movement BUE/BLE Not follow commands Incision: c/d/i, No evidence of infection  IMPRESSION/PLAN 59 y.o.females/p SAH and RMCA stroke requiring hemicraniectomy.  Small superficial wound dehiscencehealed. no further signs of infection. Completed 10 day course of Bactrim. Continue to monitor wound. - Cont supportive care - Dispo planning

## 2020-08-21 NOTE — TOC Progression Note (Signed)
Transition of Care [facility]) - Progression Note    Patient Details  Name: Denise Savage MRN: [id number] Date of Birth: 03-14-1961  Transition of Care [facility]) CM/SW [place], [place] Phone Number: 08/21/2020, 10:43 AM  Clinical Narrative:   CSW met at bedside with patient's daughter and guardian ad litem to discuss guardianship proceedings happening this afternoon. Patient's daughter should have guardianship papers over her mother this afternoon after the hearing. CSW asked the daughter to bring a copy in for the patient's chart after she has them. CSW answered questions about barriers to discharge, as well. No bed offers at this time. CSW to follow.    Expected Discharge Plan: [place] Barriers to Discharge: Continued Medical Work up,SNF Pending bed offer  Expected Discharge Plan and Services Expected Discharge Plan: [place] Choice: [place] arrangements for the past 2 months: Single Family Home                                       Social Determinants of Health (SDOH) Interventions    Readmission Risk Interventions No flowsheet data found.

## 2020-08-22 LAB — CBC WITH DIFFERENTIAL/PLATELET
Abs Immature Granulocytes: 0.02 10*3/uL (ref 0.00–0.07)
Basophils Absolute: 0 10*3/uL (ref 0.0–0.1)
Basophils Relative: 0 %
Eosinophils Absolute: 0.3 10*3/uL (ref 0.0–0.5)
Eosinophils Relative: 6 %
HCT: 37.8 % (ref 36.0–46.0)
Hemoglobin: 11.8 g/dL — ABNORMAL LOW (ref 12.0–15.0)
Immature Granulocytes: 1 %
Lymphocytes Relative: 37 %
Lymphs Abs: 1.6 10*3/uL (ref 0.7–4.0)
MCH: 28.4 pg (ref 26.0–34.0)
MCHC: 31.2 g/dL (ref 30.0–36.0)
MCV: 90.9 fL (ref 80.0–100.0)
Monocytes Absolute: 0.7 10*3/uL (ref 0.1–1.0)
Monocytes Relative: 18 %
Neutro Abs: 1.6 10*3/uL — ABNORMAL LOW (ref 1.7–7.7)
Neutrophils Relative %: 38 %
Platelets: 180 10*3/uL (ref 150–400)
RBC: 4.16 MIL/uL (ref 3.87–5.11)
RDW: 16.6 % — ABNORMAL HIGH (ref 11.5–15.5)
WBC: 4.2 10*3/uL (ref 4.0–10.5)
nRBC: 0 % (ref 0.0–0.2)

## 2020-08-22 LAB — GLUCOSE, CAPILLARY
Glucose-Capillary: 91 mg/dL (ref 70–99)
Glucose-Capillary: 104 mg/dL — ABNORMAL HIGH (ref 70–99)
Glucose-Capillary: 90 mg/dL (ref 70–99)
Glucose-Capillary: 85 mg/dL (ref 70–99)
Glucose-Capillary: 96 mg/dL (ref 70–99)
Glucose-Capillary: 98 mg/dL (ref 70–99)

## 2020-08-22 NOTE — Progress Notes (Signed)
Subjective: Patient trach and resting comfortably  Objective: Vital signs in last 24 hours: Temp:  [97.6 F (36.4 C)-99.2 F (37.3 C)] 98.3 F (36.8 C) (03/12 0739) Pulse Rate:  [81-99] 88 (03/12 0752) Resp:  [17-20] 20 (03/12 0752) BP: (114-139)/(69-83) 114/69 (03/12 0739) SpO2:  [95 %-100 %] 100 % (03/12 0752) FiO2 (%):  [21 %] 21 % (03/12 0752) Weight:  [95.3 kg] 95.3 kg (03/12 0500)  Intake/Output from previous day: No intake/output data recorded. Intake/Output this shift: No intake/output data recorded.  Neuro: right hemiplegia unchanged, on trach collar, opens eyes to voice  Lab Results: Lab Results  Component Value Date   WBC 4.2 08/22/2020   HGB 11.8 (L) 08/22/2020   HCT 37.8 08/22/2020   MCV 90.9 08/22/2020   PLT 180 08/22/2020   Lab Results  Component Value Date   INR 1.1 06/30/2020   BMET Lab Results  Component Value Date   NA 133 (L) 08/15/2020   K 4.7 08/15/2020   CL 100 08/15/2020   CO2 25 08/15/2020   GLUCOSE 103 (H) 08/15/2020   BUN 29 (H) 08/15/2020   CREATININE 0.87 08/15/2020   CALCIUM 10.9 (H) 08/15/2020    Studies/Results: No results found.  Assessment/Plan: S/p right sided hemicraniectomy for RMCA stroke and SAH. Dispo planning likely SNF. On trach collar   LOS: 67 days    Tiana Loft [facility] 08/22/2020, 8:56 AM

## 2020-08-23 LAB — CBC WITH DIFFERENTIAL/PLATELET
Abs Immature Granulocytes: 0.02 10*3/uL (ref 0.00–0.07)
Basophils Absolute: 0 10*3/uL (ref 0.0–0.1)
Basophils Relative: 1 %
Eosinophils Absolute: 0.1 10*3/uL (ref 0.0–0.5)
Eosinophils Relative: 2 %
HCT: 36.5 % (ref 36.0–46.0)
Hemoglobin: 11.3 g/dL — ABNORMAL LOW (ref 12.0–15.0)
Immature Granulocytes: 1 %
Lymphocytes Relative: 43 %
Lymphs Abs: 1.8 10*3/uL (ref 0.7–4.0)
MCH: 28.4 pg (ref 26.0–34.0)
MCHC: 31 g/dL (ref 30.0–36.0)
MCV: 91.7 fL (ref 80.0–100.0)
Monocytes Absolute: 0.5 10*3/uL (ref 0.1–1.0)
Monocytes Relative: 13 %
Neutro Abs: 1.6 10*3/uL — ABNORMAL LOW (ref 1.7–7.7)
Neutrophils Relative %: 40 %
Platelets: 175 10*3/uL (ref 150–400)
RBC: 3.98 MIL/uL (ref 3.87–5.11)
RDW: 16.6 % — ABNORMAL HIGH (ref 11.5–15.5)
WBC: 4 10*3/uL (ref 4.0–10.5)
nRBC: 0 % (ref 0.0–0.2)

## 2020-08-23 LAB — GLUCOSE, CAPILLARY
Glucose-Capillary: 93 mg/dL (ref 70–99)
Glucose-Capillary: 101 mg/dL — ABNORMAL HIGH (ref 70–99)
Glucose-Capillary: 101 mg/dL — ABNORMAL HIGH (ref 70–99)
Glucose-Capillary: 97 mg/dL (ref 70–99)
Glucose-Capillary: 92 mg/dL (ref 70–99)
Glucose-Capillary: 103 mg/dL — ABNORMAL HIGH (ref 70–99)

## 2020-08-23 NOTE — Progress Notes (Signed)
NEUROSURGERY PROGRESS NOTE  Doing well. No acute events overnight. Still on trach collar  Temp:  [98.2 F (36.8 C)-99 F (37.2 C)] 98.2 F (36.8 C) (03/13 0735) Pulse Rate:  [61-87] 82 (03/13 0800) Resp:  [15-20] 20 (03/13 0800) BP: (113-141)/(65-79) 118/71 (03/13 0735) SpO2:  [95 %-100 %] 96 % (03/13 0800) FiO2 (%):  [21 %] 21 % (03/13 0800) Weight:  [96.3 kg] 96.3 kg (03/13 0324)  Plan: Awaiting placement. No new nsgy recommendations.   Sherryl Manges, NP 08/23/2020 8:43 AM

## 2020-08-24 LAB — GLUCOSE, CAPILLARY
Glucose-Capillary: 110 mg/dL — ABNORMAL HIGH (ref 70–99)
Glucose-Capillary: 110 mg/dL — ABNORMAL HIGH (ref 70–99)
Glucose-Capillary: 97 mg/dL (ref 70–99)
Glucose-Capillary: 111 mg/dL — ABNORMAL HIGH (ref 70–99)
Glucose-Capillary: 95 mg/dL (ref 70–99)
Glucose-Capillary: 98 mg/dL (ref 70–99)

## 2020-08-24 LAB — CBC WITH DIFFERENTIAL/PLATELET
Abs Immature Granulocytes: 0.02 10*3/uL (ref 0.00–0.07)
Basophils Absolute: 0 10*3/uL (ref 0.0–0.1)
Basophils Relative: 0 %
Eosinophils Absolute: 0.1 10*3/uL (ref 0.0–0.5)
Eosinophils Relative: 1 %
HCT: 35.9 % — ABNORMAL LOW (ref 36.0–46.0)
Hemoglobin: 11.2 g/dL — ABNORMAL LOW (ref 12.0–15.0)
Immature Granulocytes: 0 %
Lymphocytes Relative: 34 %
Lymphs Abs: 1.7 10*3/uL (ref 0.7–4.0)
MCH: 28.4 pg (ref 26.0–34.0)
MCHC: 31.2 g/dL (ref 30.0–36.0)
MCV: 91.1 fL (ref 80.0–100.0)
Monocytes Absolute: 0.5 10*3/uL (ref 0.1–1.0)
Monocytes Relative: 11 %
Neutro Abs: 2.6 10*3/uL (ref 1.7–7.7)
Neutrophils Relative %: 54 %
Platelets: 131 10*3/uL — ABNORMAL LOW (ref 150–400)
RBC: 3.94 MIL/uL (ref 3.87–5.11)
RDW: 16.5 % — ABNORMAL HIGH (ref 11.5–15.5)
WBC: 4.8 10*3/uL (ref 4.0–10.5)
nRBC: 0 % (ref 0.0–0.2)

## 2020-08-24 NOTE — Progress Notes (Signed)
  NEUROSURGERY PROGRESS NOTE   No issues overnight.   EXAM:  BP (!) 146/96 (BP Location: Left Arm)   Pulse 87   Temp 97.6 F (36.4 C) (Oral)   Resp 19   Ht 5\' 4"  (1.626 m)   Wt 96.9 kg   SpO2 98%   BMI 36.67 kg/m   Sleeping Awakened to voice Trach PERRL, tracks Spontaneous movement BUE/BLE Not follow commands Incision: c/d/i, Noevidence of infection  IMPRESSION/PLAN 59 y.o.females/p SAH and RMCA stroke requiring hemicraniectomy.  Small superficial wound dehiscencehealed. no further signs of infection. Completed 10 day course of Bactrim. Continue to monitor wound. - Cont supportive care - Dispo planning

## 2020-08-25 LAB — GLUCOSE, CAPILLARY
Glucose-Capillary: 104 mg/dL — ABNORMAL HIGH (ref 70–99)
Glucose-Capillary: 111 mg/dL — ABNORMAL HIGH (ref 70–99)
Glucose-Capillary: 113 mg/dL — ABNORMAL HIGH (ref 70–99)
Glucose-Capillary: 88 mg/dL (ref 70–99)
Glucose-Capillary: 91 mg/dL (ref 70–99)
Glucose-Capillary: 96 mg/dL (ref 70–99)

## 2020-08-25 LAB — CBC WITH DIFFERENTIAL/PLATELET
Abs Immature Granulocytes: 0.03 10*3/uL (ref 0.00–0.07)
Basophils Absolute: 0 10*3/uL (ref 0.0–0.1)
Basophils Relative: 0 %
Eosinophils Absolute: 0 10*3/uL (ref 0.0–0.5)
Eosinophils Relative: 1 %
HCT: 38 % (ref 36.0–46.0)
Hemoglobin: 12.2 g/dL (ref 12.0–15.0)
Immature Granulocytes: 1 %
Lymphocytes Relative: 26 %
Lymphs Abs: 1.6 10*3/uL (ref 0.7–4.0)
MCH: 29.1 pg (ref 26.0–34.0)
MCHC: 32.1 g/dL (ref 30.0–36.0)
MCV: 90.7 fL (ref 80.0–100.0)
Monocytes Absolute: 0.9 10*3/uL (ref 0.1–1.0)
Monocytes Relative: 15 %
Neutro Abs: 3.6 10*3/uL (ref 1.7–7.7)
Neutrophils Relative %: 57 %
Platelets: 116 10*3/uL — ABNORMAL LOW (ref 150–400)
RBC: 4.19 MIL/uL (ref 3.87–5.11)
RDW: 16.4 % — ABNORMAL HIGH (ref 11.5–15.5)
WBC: 6.3 10*3/uL (ref 4.0–10.5)
nRBC: 0 % (ref 0.0–0.2)

## 2020-08-25 NOTE — Progress Notes (Signed)
Nutrition Follow-up  DOCUMENTATION CODES:   Obesity unspecified  INTERVENTION:  Continue Tube Feeding via PEG:  Osmolite 1.2 @ 55 ml/hr (1320 ml) 90 ml ProSource BID  Provides: 1744 kcal, 117 grams protein, 208 grams carbohydrate, and 1070 ml free water.   200 ml free water every 6 hours Total free water: 1870 ml   NUTRITION DIAGNOSIS:   Inadequate oral intake related to inability to eat as evidenced by NPO status. Ongoing.   GOAL:   Patient will meet greater than or equal to 90% of their needs Met with TF.   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 Postoperative course complicated by seizure activity.   1/5 cortrak placed (tip gastric)  1/7 hemicraniectomy 1/19 s/p trach and PEG placement  2/8 trach changed to #6 shiley flex  Pt continues to be pending d/c to SNF. Pt continues to tolerate TC and TF via PEG.  Current orders: Osmolite 1.2 @ 20ml/hr with 37ml Prosource TF BID and 273ml free water flushes Q6H  No UOP documented.   Admission weight: 100 kg Current weight: 96 kg  No edema noted per RN assessment.   Labs and medications reviewed.  CBGs 454-098-11  Diet Order:   Diet Order    None      EDUCATION NEEDS:   No education needs have been identified at this time  Skin:  Skin Assessment: Reviewed RN Assessment  Last BM:  3/14  Height:   Ht Readings from Last 1 Encounters:  06/30/20 $RemoveB'5\' 4"'bvFXvLvt$  (1.626 m)    Weight:   Wt Readings from Last 1 Encounters:  08/25/20 96 kg    Ideal Body Weight:  54.4 kg  BMI:  Body mass index is 36.33 kg/m.  Estimated Nutritional Needs:   Kcal:  1600-1800  Protein:  105-125 grams  Fluid:  >1.6 L/day  Larkin Ina, MS, RD, LDN RD pager number and  weekend/on-call pager number located in [place].

## 2020-08-25 NOTE — Progress Notes (Signed)
   Providing Compassionate, Quality Care - Together   Subjective: Nurse reports no issues overnight.  Objective: Vital signs in last 24 hours: Temp:  [98.1 F (36.7 C)-98.6 F (37 C)] 98.2 F (36.8 C) (03/15 1157) Pulse Rate:  [79-90] 81 (03/15 1157) Resp:  [18-25] 18 (03/15 1157) BP: (112-148)/(69-87) 141/76 (03/15 1157) SpO2:  [95 %-98 %] 96 % (03/15 0600) FiO2 (%):  [21 %] 21 % (03/15 1115) Weight:  [96 kg] 96 kg (03/15 0500)  Intake/Output from previous day: 03/14 0701 - 03/15 0700 In: 605 [NG/GT:605] Out: -  Intake/Output this shift: Total I/O In: 275 [NG/GT:275] Out: -   Responds to voice, unable to follow commands PERRLA, left gaze preference, does track Trach Purposeful movement LUE,spontaneous movement BLE Unable to elicit response in RUE Left craniectomy scalp flap is concave and soft Incision is clean, dry, and intact  Lab Results: Recent Labs    08/24/20 0211 08/25/20 0251  WBC 4.8 6.3  HGB 11.2* 12.2  HCT 35.9* 38.0  PLT 131* 116*   BMET No results for input(s): NA, K, CL, CO2, GLUCOSE, BUN, CREATININE, CALCIUM in the last 72 hours.  Studies/Results: No results found.  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 Conchita Paris. She is68days status post left hemicraniectomy (06/19/2020) due to malignant cerebral edema. Postoperative course has been complicated by seizure activity. Tracheostomy placed on 07/01/2020.   LOS: 70 days    -Awaiting SNF placement -Continue supportive care   Val Eagle, DNP, AGNP-C Nurse Practitioner  [facility] [address], [address], [place], [place] [address] P: [phone]    F: [phone]  08/25/2020, 12:22 PM

## 2020-08-25 NOTE — Plan of Care (Signed)
  Problem: Health Behavior/Discharge Planning: Goal: Ability to manage health-related needs will improve Outcome: Progressing   Problem: Nutrition: Goal: Risk of aspiration will decrease Outcome: Progressing   Problem: Spontaneous Subarachnoid Hemorrhage Tissue Perfusion: Goal: Complications of Spontaneous Subarachnoid Hemorrhage will be minimized Outcome: Progressing   Problem: Clinical Measurements: Goal: Ability to maintain clinical measurements within normal limits will improve Outcome: Progressing Goal: Will remain free from infection Outcome: Progressing Goal: Diagnostic test results will improve Outcome: Progressing Goal: Respiratory complications will improve Outcome: Progressing Goal: Cardiovascular complication will be avoided Outcome: Progressing   Problem: Activity: Goal: Risk for activity intolerance will decrease Outcome: Progressing   Problem: Nutrition: Goal: Adequate nutrition will be maintained Outcome: Progressing   Problem: Coping: Goal: Level of anxiety will decrease Outcome: Progressing   Problem: Elimination: Goal: Will not experience complications related to bowel motility Outcome: Progressing Goal: Will not experience complications related to urinary retention Outcome: Progressing   Problem: Pain Managment: Goal: General experience of comfort will improve Outcome: Progressing   Problem: Safety: Goal: Ability to remain free from injury will improve Outcome: Progressing   Problem: Skin Integrity: Goal: Risk for impaired skin integrity will decrease Outcome: Progressing

## 2020-08-26 LAB — CBC WITH DIFFERENTIAL/PLATELET
Abs Immature Granulocytes: 0.05 10*3/uL (ref 0.00–0.07)
Basophils Absolute: 0.1 10*3/uL (ref 0.0–0.1)
Basophils Relative: 1 %
Eosinophils Absolute: 0.1 10*3/uL (ref 0.0–0.5)
Eosinophils Relative: 1 %
HCT: 38.8 % (ref 36.0–46.0)
Hemoglobin: 12.3 g/dL (ref 12.0–15.0)
Immature Granulocytes: 1 %
Lymphocytes Relative: 32 %
Lymphs Abs: 1.8 10*3/uL (ref 0.7–4.0)
MCH: 28.5 pg (ref 26.0–34.0)
MCHC: 31.7 g/dL (ref 30.0–36.0)
MCV: 90 fL (ref 80.0–100.0)
Monocytes Absolute: 0.8 10*3/uL (ref 0.1–1.0)
Monocytes Relative: 15 %
Neutro Abs: 2.8 10*3/uL (ref 1.7–7.7)
Neutrophils Relative %: 50 %
Platelets: 242 10*3/uL (ref 150–400)
RBC: 4.31 MIL/uL (ref 3.87–5.11)
RDW: 16.4 % — ABNORMAL HIGH (ref 11.5–15.5)
WBC: 5.5 10*3/uL (ref 4.0–10.5)
nRBC: 0 % (ref 0.0–0.2)

## 2020-08-26 LAB — GLUCOSE, CAPILLARY
Glucose-Capillary: 101 mg/dL — ABNORMAL HIGH (ref 70–99)
Glucose-Capillary: 105 mg/dL — ABNORMAL HIGH (ref 70–99)
Glucose-Capillary: 112 mg/dL — ABNORMAL HIGH (ref 70–99)
Glucose-Capillary: 88 mg/dL (ref 70–99)
Glucose-Capillary: 91 mg/dL (ref 70–99)
Glucose-Capillary: 99 mg/dL (ref 70–99)

## 2020-08-26 MED ORDER — CHLORHEXIDINE GLUCONATE 0.12 % MT SOLN
OROMUCOSAL | Status: AC
  Administered 2020-08-26: 15 mL via OROMUCOSAL
  Filled 2020-08-26: qty 15

## 2020-08-26 NOTE — Plan of Care (Signed)
  Problem: Nutrition: Goal: Risk of aspiration will decrease Outcome: Progressing   Problem: Spontaneous Subarachnoid Hemorrhage Tissue Perfusion: Goal: Complications of Spontaneous Subarachnoid Hemorrhage will be minimized Outcome: Progressing   Problem: Clinical Measurements: Goal: Ability to maintain clinical measurements within normal limits will improve Outcome: Progressing Goal: Will remain free from infection Outcome: Progressing Goal: Diagnostic test results will improve Outcome: Progressing Goal: Respiratory complications will improve Outcome: Progressing Goal: Cardiovascular complication will be avoided Outcome: Progressing   Problem: Nutrition: Goal: Adequate nutrition will be maintained Outcome: Progressing   Problem: Coping: Goal: Level of anxiety will decrease Outcome: Progressing   Problem: Elimination: Goal: Will not experience complications related to bowel motility Outcome: Progressing Goal: Will not experience complications related to urinary retention Outcome: Progressing   Problem: Safety: Goal: Ability to remain free from injury will improve Outcome: Progressing   Problem: Skin Integrity: Goal: Risk for impaired skin integrity will decrease Outcome: Progressing

## 2020-08-26 NOTE — Progress Notes (Signed)
   Providing Compassionate, Quality Care - Together   Subjective: No issues reported overnight.  Objective: Vital signs in last 24 hours: Temp:  [98.3 F (36.8 C)-99 F (37.2 C)] 99 F (37.2 C) (03/16 1124) Pulse Rate:  [81-93] 89 (03/16 1124) Resp:  [14-20] 14 (03/16 1124) BP: (121-156)/(74-85) 121/83 (03/16 1124) SpO2:  [93 %-98 %] 98 % (03/16 1124) FiO2 (%):  [21 %] 21 % (03/16 1124)  Intake/Output from previous day: 03/15 0701 - 03/16 0700 In: 642.6 [NG/GT:642.6] Out: -  Intake/Output this shift: Total I/O In: 1013.8 [NG/GT:1013.8] Out: -   Responds to voice, unable to follow commands PERRLA, left gaze preference, does track Trach Purposeful movement LUE,spontaneous movement BLE Unable to elicit response in RUE Left craniectomy scalp flap is concave and soft Incision is clean, dry, and intact   Lab Results: Recent Labs    08/25/20 0251 08/26/20 0417  WBC 6.3 5.5  HGB 12.2 12.3  HCT 38.0 38.8  PLT 116* 242   BMET No results for input(s): NA, K, CL, CO2, GLUCOSE, BUN, CREATININE, CALCIUM in the last 72 hours.  Studies/Results: No results found.  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 Conchita Paris. She is69days status post left hemicraniectomy (06/19/2020) due to malignant cerebral edema. Postoperative course has been complicated by seizure activity. Tracheostomy placed on 07/01/2020.   LOS: 71 days    -Awaiting SNF placement -Continue supportive care   Val Eagle, DNP, AGNP-C Nurse Practitioner  [facility] [address], [place], [place] [address] P: [phone]    F: [phone]  08/26/2020, 1:08 PM

## 2020-08-27 LAB — CBC WITH DIFFERENTIAL/PLATELET
Abs Immature Granulocytes: 0.05 10*3/uL (ref 0.00–0.07)
Basophils Absolute: 0.1 10*3/uL (ref 0.0–0.1)
Basophils Relative: 1 %
Eosinophils Absolute: 0.1 10*3/uL (ref 0.0–0.5)
Eosinophils Relative: 1 %
HCT: 36.4 % (ref 36.0–46.0)
Hemoglobin: 11.8 g/dL — ABNORMAL LOW (ref 12.0–15.0)
Immature Granulocytes: 1 %
Lymphocytes Relative: 28 %
Lymphs Abs: 1.6 10*3/uL (ref 0.7–4.0)
MCH: 29.1 pg (ref 26.0–34.0)
MCHC: 32.4 g/dL (ref 30.0–36.0)
MCV: 89.9 fL (ref 80.0–100.0)
Monocytes Absolute: 0.8 10*3/uL (ref 0.1–1.0)
Monocytes Relative: 14 %
Neutro Abs: 3.2 10*3/uL (ref 1.7–7.7)
Neutrophils Relative %: 55 %
Platelets: 243 10*3/uL (ref 150–400)
RBC: 4.05 MIL/uL (ref 3.87–5.11)
RDW: 16.4 % — ABNORMAL HIGH (ref 11.5–15.5)
WBC: 5.8 10*3/uL (ref 4.0–10.5)
nRBC: 0 % (ref 0.0–0.2)

## 2020-08-27 LAB — GLUCOSE, CAPILLARY
Glucose-Capillary: 100 mg/dL — ABNORMAL HIGH (ref 70–99)
Glucose-Capillary: 105 mg/dL — ABNORMAL HIGH (ref 70–99)
Glucose-Capillary: 109 mg/dL — ABNORMAL HIGH (ref 70–99)
Glucose-Capillary: 109 mg/dL — ABNORMAL HIGH (ref 70–99)
Glucose-Capillary: 113 mg/dL — ABNORMAL HIGH (ref 70–99)
Glucose-Capillary: 96 mg/dL (ref 70–99)

## 2020-08-27 NOTE — Plan of Care (Signed)
  Problem: Education: Goal: Knowledge of secondary prevention will improve Outcome: Progressing   Problem: Education: Goal: Knowledge of patient specific risk factors addressed and post discharge goals established will improve Outcome: Progressing   Problem: Education: Goal: Knowledge of disease or condition will improve Outcome: Progressing   

## 2020-08-27 NOTE — Progress Notes (Signed)
   Providing Compassionate, Quality Care - Together   Subjective: Nurse reports no issues overnight.  Objective: Vital signs in last 24 hours: Temp:  [98.3 F (36.8 C)-99 F (37.2 C)] 98.4 F (36.9 C) (03/17 0803) Pulse Rate:  [67-91] 79 (03/17 0824) Resp:  [14-24] 21 (03/17 0824) BP: (121-151)/(69-84) 130/84 (03/17 0803) SpO2:  [95 %-100 %] 95 % (03/17 0824) FiO2 (%):  [21 %] 21 % (03/17 0824)  Intake/Output from previous day: 03/16 0701 - 03/17 0700 In: 2397.4 [NG/GT:2397.4] Out: 400 [Stool:400] Intake/Output this shift: Total I/O In: -  Out: 500 [Stool:500]  Responds to voice, unable to follow commands PERRLA, left gaze preference, does track Trach Purposeful movement LUE,spontaneous movement BLE Unable to elicit response in RUE Left craniectomy scalp flap is concave and soft Incision is clean, dry, and intact  Lab Results: Recent Labs    08/26/20 0417 08/27/20 0430  WBC 5.5 5.8  HGB 12.3 11.8*  HCT 38.8 36.4  PLT 242 243   BMET No results for input(s): NA, K, CL, CO2, GLUCOSE, BUN, CREATININE, CALCIUM in the last 72 hours.  Studies/Results: No results found.  Assessment/Plan: Patient is72days status post coiling of LMCA aneurysm (06/16/2020) by Dr. Conchita Paris. She is70days status post left hemicraniectomy (06/19/2020) due to malignant cerebral edema. Postoperative course has been complicated by seizure activity. Tracheostomy placed on 07/01/2020.   LOS: 72 days     -Awaiting SNF placement -Continue supportive care   Val Eagle, DNP, AGNP-C Nurse Practitioner  [facility] [address], [place], [place] [address] P: [phone]    F: [phone]  08/27/2020, 9:10 AM

## 2020-08-28 LAB — CBC WITH DIFFERENTIAL/PLATELET
Abs Immature Granulocytes: 0.08 10*3/uL — ABNORMAL HIGH (ref 0.00–0.07)
Basophils Absolute: 0.1 10*3/uL (ref 0.0–0.1)
Basophils Relative: 1 %
Eosinophils Absolute: 0.1 10*3/uL (ref 0.0–0.5)
Eosinophils Relative: 2 %
HCT: 35.1 % — ABNORMAL LOW (ref 36.0–46.0)
Hemoglobin: 11.4 g/dL — ABNORMAL LOW (ref 12.0–15.0)
Immature Granulocytes: 1 %
Lymphocytes Relative: 28 %
Lymphs Abs: 1.9 10*3/uL (ref 0.7–4.0)
MCH: 29.3 pg (ref 26.0–34.0)
MCHC: 32.5 g/dL (ref 30.0–36.0)
MCV: 90.2 fL (ref 80.0–100.0)
Monocytes Absolute: 1 10*3/uL (ref 0.1–1.0)
Monocytes Relative: 15 %
Neutro Abs: 3.7 10*3/uL (ref 1.7–7.7)
Neutrophils Relative %: 53 %
Platelets: 239 10*3/uL (ref 150–400)
RBC: 3.89 MIL/uL (ref 3.87–5.11)
RDW: 16.5 % — ABNORMAL HIGH (ref 11.5–15.5)
WBC: 6.8 10*3/uL (ref 4.0–10.5)
nRBC: 0 % (ref 0.0–0.2)

## 2020-08-28 LAB — GLUCOSE, CAPILLARY
Glucose-Capillary: 81 mg/dL (ref 70–99)
Glucose-Capillary: 108 mg/dL — ABNORMAL HIGH (ref 70–99)
Glucose-Capillary: 109 mg/dL — ABNORMAL HIGH (ref 70–99)
Glucose-Capillary: 102 mg/dL — ABNORMAL HIGH (ref 70–99)
Glucose-Capillary: 95 mg/dL (ref 70–99)

## 2020-08-28 NOTE — Progress Notes (Signed)
   Providing Compassionate, Quality Care - Together   Subjective: Nurse reports no issues overnight. She reports there is one staple left in scalp and inquires about removing it.  Objective: Vital signs in last 24 hours: Temp:  [98.2 F (36.8 C)-99 F (37.2 C)] 98.2 F (36.8 C) (03/18 0753) Pulse Rate:  [71-89] 71 (03/18 0753) Resp:  [15-24] 15 (03/18 0753) BP: (114-152)/(70-85) 126/72 (03/18 0753) SpO2:  [94 %-99 %] 96 % (03/18 0753) FiO2 (%):  [21 %] 21 % (03/18 0417) Weight:  [92.5 kg] 92.5 kg (03/18 0400)  Intake/Output from previous day: 03/17 0701 - 03/18 0700 In: 1320 [NG/GT:1320] Out: 900 [Urine:400; Stool:500] Intake/Output this shift: No intake/output data recorded.  Responds to voice, unable to follow commands PERRLA, left gaze preference, does track Trach Purposeful movement LUE,spontaneous movement BLE Unable to elicit response in RUE Incision is clean, dry, and intact   Lab Results: Recent Labs    08/27/20 0430 08/28/20 0407  WBC 5.8 6.8  HGB 11.8* 11.4*  HCT 36.4 35.1*  PLT 243 239   BMET No results for input(s): NA, K, CL, CO2, GLUCOSE, BUN, CREATININE, CALCIUM in the last 72 hours.  Studies/Results: No results found.  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 Conchita Paris. She is71days status post left hemicraniectomy (06/19/2020) due to malignant cerebral edema. Postoperative course has been complicated by seizure activity. Tracheostomy placed on 07/01/2020.   LOS: 73 days    -Remove remaining staple at scalp -Awaiting SNF placement -Continue supportive care  Val Eagle, DNP, AGNP-C Nurse Practitioner  [facility] [address], [address], [place], [place] [address] P: [phone]    F: [phone]  08/28/2020, 10:52 AM

## 2020-08-28 NOTE — Plan of Care (Signed)

## 2020-08-29 LAB — GLUCOSE, CAPILLARY
Glucose-Capillary: 107 mg/dL — ABNORMAL HIGH (ref 70–99)
Glucose-Capillary: 108 mg/dL — ABNORMAL HIGH (ref 70–99)
Glucose-Capillary: 109 mg/dL — ABNORMAL HIGH (ref 70–99)
Glucose-Capillary: 110 mg/dL — ABNORMAL HIGH (ref 70–99)
Glucose-Capillary: 113 mg/dL — ABNORMAL HIGH (ref 70–99)
Glucose-Capillary: 116 mg/dL — ABNORMAL HIGH (ref 70–99)
Glucose-Capillary: 95 mg/dL (ref 70–99)

## 2020-08-29 LAB — CBC WITH DIFFERENTIAL/PLATELET
Abs Immature Granulocytes: 0.05 10*3/uL (ref 0.00–0.07)
Basophils Absolute: 0.1 10*3/uL (ref 0.0–0.1)
Basophils Relative: 1 %
Eosinophils Absolute: 0.1 10*3/uL (ref 0.0–0.5)
Eosinophils Relative: 2 %
HCT: 36.2 % (ref 36.0–46.0)
Hemoglobin: 11.9 g/dL — ABNORMAL LOW (ref 12.0–15.0)
Immature Granulocytes: 1 %
Lymphocytes Relative: 24 %
Lymphs Abs: 1.6 10*3/uL (ref 0.7–4.0)
MCH: 29.3 pg (ref 26.0–34.0)
MCHC: 32.9 g/dL (ref 30.0–36.0)
MCV: 89.2 fL (ref 80.0–100.0)
Monocytes Absolute: 1 10*3/uL (ref 0.1–1.0)
Monocytes Relative: 14 %
Neutro Abs: 4 10*3/uL (ref 1.7–7.7)
Neutrophils Relative %: 58 %
Platelets: 191 10*3/uL (ref 150–400)
RBC: 4.06 MIL/uL (ref 3.87–5.11)
RDW: 16.5 % — ABNORMAL HIGH (ref 11.5–15.5)
WBC: 6.8 10*3/uL (ref 4.0–10.5)
nRBC: 0 % (ref 0.0–0.2)

## 2020-08-29 NOTE — Progress Notes (Signed)
   Providing Compassionate, Quality Care - Together  NEUROSURGERY PROGRESS NOTE   S: No new issues  O: EXAM:  BP (!) 155/74 (BP Location: Left Leg)   Pulse 82   Temp 98.6 F (37 C) (Axillary)   Resp 20   Ht 5\' 4"  (1.626 m)   Wt 92.5 kg Comment: removed equipment off bed for weight  SpO2 98%   BMI 35.00 kg/m   Awake, alert, nonconversant Left flap soft, sunken Spontaneously moves all extremities, slight weakness in the right upper extremity PERRL Trach in place  ASSESSMENT:  60 y.o. female with  Status post left MCA aneurysm coiling, hemicraniectomy  PLAN: -Continue supportive care, awaiting SNF placement    Thank you for allowing me to participate in this patient's care.  Please do not hesitate to call with questions or concerns.   66, DO Neurosurgeon [facility] Cell: [phone]

## 2020-08-30 LAB — CBC WITH DIFFERENTIAL/PLATELET
Abs Immature Granulocytes: 0.04 10*3/uL (ref 0.00–0.07)
Basophils Absolute: 0.1 10*3/uL (ref 0.0–0.1)
Basophils Relative: 1 %
Eosinophils Absolute: 0.1 10*3/uL (ref 0.0–0.5)
Eosinophils Relative: 2 %
HCT: 36.7 % (ref 36.0–46.0)
Hemoglobin: 12.1 g/dL (ref 12.0–15.0)
Immature Granulocytes: 1 %
Lymphocytes Relative: 25 %
Lymphs Abs: 1.6 10*3/uL (ref 0.7–4.0)
MCH: 29.6 pg (ref 26.0–34.0)
MCHC: 33 g/dL (ref 30.0–36.0)
MCV: 89.7 fL (ref 80.0–100.0)
Monocytes Absolute: 1 10*3/uL (ref 0.1–1.0)
Monocytes Relative: 15 %
Neutro Abs: 3.7 10*3/uL (ref 1.7–7.7)
Neutrophils Relative %: 56 %
Platelets: 218 10*3/uL (ref 150–400)
RBC: 4.09 MIL/uL (ref 3.87–5.11)
RDW: 16.3 % — ABNORMAL HIGH (ref 11.5–15.5)
WBC: 6.5 10*3/uL (ref 4.0–10.5)
nRBC: 0 % (ref 0.0–0.2)

## 2020-08-30 LAB — GLUCOSE, CAPILLARY
Glucose-Capillary: 107 mg/dL — ABNORMAL HIGH (ref 70–99)
Glucose-Capillary: 90 mg/dL (ref 70–99)
Glucose-Capillary: 122 mg/dL — ABNORMAL HIGH (ref 70–99)
Glucose-Capillary: 100 mg/dL — ABNORMAL HIGH (ref 70–99)
Glucose-Capillary: 98 mg/dL (ref 70–99)

## 2020-08-30 NOTE — Plan of Care (Signed)
  Problem: Education: Goal: Knowledge of disease or condition will improve Outcome: Progressing Goal: Knowledge of secondary prevention will improve Outcome: Progressing Goal: Knowledge of patient specific risk factors addressed and post discharge goals established will improve Outcome: Progressing Goal: Individualized Educational Video(s) Outcome: Progressing   Problem: Coping: Goal: Will verbalize positive feelings about self Outcome: Progressing Goal: Will identify appropriate support needs Outcome: Progressing   Problem: Health Behavior/Discharge Planning: Goal: Ability to manage health-related needs will improve Outcome: Progressing   Problem: Self-Care: Goal: Ability to participate in self-care as condition permits will improve Outcome: Progressing Goal: Verbalization of feelings and concerns over difficulty with self-care will improve Outcome: Progressing Goal: Ability to communicate needs accurately will improve Outcome: Progressing   Problem: Nutrition: Goal: Risk of aspiration will decrease Outcome: Progressing Goal: Dietary intake will improve Outcome: Progressing   Problem: Spontaneous Subarachnoid Hemorrhage Tissue Perfusion: Goal: Complications of Spontaneous Subarachnoid Hemorrhage will be minimized Outcome: Progressing   Problem: Education: Goal: Knowledge of General Education information will improve Description: Including pain rating scale, medication(s)/side effects and non-pharmacologic comfort measures Outcome: Progressing   Problem: Health Behavior/Discharge Planning: Goal: Ability to manage health-related needs will improve Outcome: Progressing   Problem: Clinical Measurements: Goal: Ability to maintain clinical measurements within normal limits will improve Outcome: Progressing Goal: Will remain free from infection Outcome: Progressing Goal: Diagnostic test results will improve Outcome: Progressing Goal: Respiratory complications will  improve Outcome: Progressing Goal: Cardiovascular complication will be avoided Outcome: Progressing   Problem: Activity: Goal: Risk for activity intolerance will decrease Outcome: Progressing   Problem: Nutrition: Goal: Adequate nutrition will be maintained Outcome: Progressing   Problem: Coping: Goal: Level of anxiety will decrease Outcome: Progressing   Problem: Elimination: Goal: Will not experience complications related to bowel motility Outcome: Progressing Goal: Will not experience complications related to urinary retention Outcome: Progressing   Problem: Pain Managment: Goal: General experience of comfort will improve Outcome: Progressing   Problem: Safety: Goal: Ability to remain free from injury will improve Outcome: Progressing   Problem: Skin Integrity: Goal: Risk for impaired skin integrity will decrease Outcome: Progressing

## 2020-08-30 NOTE — Progress Notes (Signed)
Patient ID: Denise Savage, female   DOB: [birth year], 60 y.o.   MRN: [id number]  BP 140/83 (BP Location: Left Leg)   Pulse 79   Temp 98 F (36.7 C) (Oral)   Resp 20   Ht 5\' 4"  (1.626 m)   Wt 96.4 kg   SpO2 100%   BMI 36.48 kg/m  On trach collar No neurological changes Awaiting placement No new recommendations

## 2020-08-30 NOTE — TOC Progression Note (Signed)
Transition of Care [facility]) - Progression Note    Patient Details  Name: CLESSIE KARRAS MRN: [id number] Date of Birth: 1961/05/11  Transition of Care [facility]) CM/SW Contact  Carley Hammed, [place] Phone Number: 08/30/2020, 9:49 AM  Clinical Narrative:     CSW was requested to follow up with [facility] during morning rounds to see if they would take pt. Per previous notes, CM and SW had previously attempted to place this pt in both [facility] and [facility], but cannot, due to no payor source. CSW attempted to follow up with [facility], however admissions is not available on the weekend. Weekday SW is actively attempting to find SNF placement for this pt, however with medical needs and no payor source this is difficult. SW will continue working diligently to find an appropriate next venue for discharge.  Expected Discharge Plan: Skilled Nursing Facility Barriers to Discharge: Continued Medical Work up,SNF Pending bed offer  Expected Discharge Plan and Services Expected Discharge Plan: Skilled Nursing Facility     Post Acute Care Choice: Skilled Nursing Facility Living arrangements for the past 2 months: Single Family Home                                       Social Determinants of Health (SDOH) Interventions    Readmission Risk Interventions No flowsheet data found.

## 2020-08-31 LAB — CBC WITH DIFFERENTIAL/PLATELET
Abs Immature Granulocytes: 0.02 10*3/uL (ref 0.00–0.07)
Basophils Absolute: 0.1 10*3/uL (ref 0.0–0.1)
Basophils Relative: 1 %
Eosinophils Absolute: 0.2 10*3/uL (ref 0.0–0.5)
Eosinophils Relative: 3 %
HCT: 43.8 % (ref 36.0–46.0)
Hemoglobin: 13.7 g/dL (ref 12.0–15.0)
Immature Granulocytes: 0 %
Lymphocytes Relative: 26 %
Lymphs Abs: 1.6 10*3/uL (ref 0.7–4.0)
MCH: 28.4 pg (ref 26.0–34.0)
MCHC: 31.3 g/dL (ref 30.0–36.0)
MCV: 90.7 fL (ref 80.0–100.0)
Monocytes Absolute: 0.6 10*3/uL (ref 0.1–1.0)
Monocytes Relative: 11 %
Neutro Abs: 3.5 10*3/uL (ref 1.7–7.7)
Neutrophils Relative %: 59 %
Platelets: 239 10*3/uL (ref 150–400)
RBC: 4.83 MIL/uL (ref 3.87–5.11)
RDW: 15.9 % — ABNORMAL HIGH (ref 11.5–15.5)
WBC: 6 10*3/uL (ref 4.0–10.5)
nRBC: 0 % (ref 0.0–0.2)

## 2020-08-31 LAB — GLUCOSE, CAPILLARY
Glucose-Capillary: 102 mg/dL — ABNORMAL HIGH (ref 70–99)
Glucose-Capillary: 116 mg/dL — ABNORMAL HIGH (ref 70–99)
Glucose-Capillary: 120 mg/dL — ABNORMAL HIGH (ref 70–99)
Glucose-Capillary: 84 mg/dL (ref 70–99)
Glucose-Capillary: 88 mg/dL (ref 70–99)
Glucose-Capillary: 95 mg/dL (ref 70–99)

## 2020-08-31 NOTE — Progress Notes (Signed)
  NEUROSURGERY PROGRESS NOTE   No issues overnight.   EXAM:  BP (!) 138/97 (BP Location: Left Leg)   Pulse 86   Temp 98.8 F (37.1 C) (Axillary)   Resp 20   Ht 5\' 4"  (1.626 m)   Wt 94.9 kg   SpO2 97%   BMI 35.91 kg/m   Sleeping Easily awakened Trach PERRL, tracks Spontaneous movementBUE/BLE Not follow commands Incision: c/d/i, Noevidence of infection  IMPRESSION/PLAN 59 y.o.females/p SAH and RMCA stroke requiring hemicraniectomy.  Small superficial wound dehiscencehealed. no further signs of infection. Completed 10 day course of Bactrim.Continue to monitor wound.  - Cont supportive care - Dispo planning

## 2020-09-01 LAB — CBC WITH DIFFERENTIAL/PLATELET
Abs Immature Granulocytes: 0.02 10*3/uL (ref 0.00–0.07)
Basophils Absolute: 0.1 10*3/uL (ref 0.0–0.1)
Basophils Relative: 1 %
Eosinophils Absolute: 0.1 10*3/uL (ref 0.0–0.5)
Eosinophils Relative: 1 %
HCT: 45.7 % (ref 36.0–46.0)
Hemoglobin: 14.9 g/dL (ref 12.0–15.0)
Immature Granulocytes: 0 %
Lymphocytes Relative: 18 %
Lymphs Abs: 1.2 10*3/uL (ref 0.7–4.0)
MCH: 29.3 pg (ref 26.0–34.0)
MCHC: 32.6 g/dL (ref 30.0–36.0)
MCV: 89.8 fL (ref 80.0–100.0)
Monocytes Absolute: 0.8 10*3/uL (ref 0.1–1.0)
Monocytes Relative: 11 %
Neutro Abs: 4.6 10*3/uL (ref 1.7–7.7)
Neutrophils Relative %: 69 %
Platelets: 244 10*3/uL (ref 150–400)
RBC: 5.09 MIL/uL (ref 3.87–5.11)
RDW: 16.1 % — ABNORMAL HIGH (ref 11.5–15.5)
WBC: 6.6 10*3/uL (ref 4.0–10.5)
nRBC: 0 % (ref 0.0–0.2)

## 2020-09-01 LAB — GLUCOSE, CAPILLARY
Glucose-Capillary: 101 mg/dL — ABNORMAL HIGH (ref 70–99)
Glucose-Capillary: 103 mg/dL — ABNORMAL HIGH (ref 70–99)
Glucose-Capillary: 104 mg/dL — ABNORMAL HIGH (ref 70–99)
Glucose-Capillary: 123 mg/dL — ABNORMAL HIGH (ref 70–99)
Glucose-Capillary: 128 mg/dL — ABNORMAL HIGH (ref 70–99)
Glucose-Capillary: 135 mg/dL — ABNORMAL HIGH (ref 70–99)
Glucose-Capillary: 99 mg/dL (ref 70–99)

## 2020-09-01 NOTE — Progress Notes (Signed)
  NEUROSURGERY PROGRESS NOTE   No issues overnight.   EXAM:  BP (!) 160/80 (BP Location: Left Leg)   Pulse 98   Temp 98.3 F (36.8 C) (Axillary)   Resp (!) 22   Ht 5\' 4"  (1.626 m)   Wt 94.6 kg   SpO2 98%   BMI 35.80 kg/m   Sleeping. Did not awaken patient. Per nursing, patient has been stable without changes neurologically.  IMPRESSION/PLAN 60 y.o. female s/p SAH and RMCA stroke requiring hemicraniectomy. Resolved superficial wound dehiscence/infection. - Cont supportive care - Dispo planning

## 2020-09-01 NOTE — Progress Notes (Signed)
Nutrition Follow-up  DOCUMENTATION CODES:   Obesity unspecified  INTERVENTION:  Continue Tube Feeding via PEG:  Osmolite 1.2 @ 55 ml/hr (1320 ml) 90 ml ProSource BID  Provides: 1744 kcal, 117 grams protein, 208 grams carbohydrate, and 1070 ml free water.   200 ml free water every 6 hours Total free water: 1870 ml   NUTRITION DIAGNOSIS:   Inadequate oral intake related to inability to eat as evidenced by NPO status. Ongoing.   GOAL:   Patient will meet greater than or equal to 90% of their needs Met with TF.   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 Postoperative course complicated by seizure activity.   1/5 cortrak placed (tip gastric)  1/7 hemicraniectomy 1/19 s/p trach and PEG placement  2/8 trach changed to #6 shiley flex  Pt continues to await discharge. Pt continues to tolerate TC and TF via PEG. Current orders: Osmolite 1.2 @ 59m/hr with 945mProsource TF BID and 2002mree water flushes Q6H  No UOP documented.   Admission weight: 100 kg Current weight: 94.6 kg  No edema noted per RN assessment.   Labs and medications reviewed.  CBGs [id number] Order:   Diet Order    None      EDUCATION NEEDS:   No education needs have been identified at this time  Skin:  Skin Assessment: Reviewed RN Assessment  Last BM:  3/21 type 7  Height:   Ht Readings from Last 1 Encounters:  06/30/20 _0  (1.626 m)    Weight:   Wt Readings from Last 1 Encounters:  09/01/20 94.6 kg    Ideal Body Weight:  54.4 kg  BMI:  Body mass index is 35.8 kg/m.  Estimated Nutritional Needs:   Kcal:  1600-1800  Protein:  105-125 grams  Fluid:  >1.6 L/day  AmaLarkin InaS, RD, LDN RD pager number and  weekend/on-call pager number located in [place]

## 2020-09-02 LAB — GLUCOSE, CAPILLARY
Glucose-Capillary: 104 mg/dL — ABNORMAL HIGH (ref 70–99)
Glucose-Capillary: 113 mg/dL — ABNORMAL HIGH (ref 70–99)
Glucose-Capillary: 120 mg/dL — ABNORMAL HIGH (ref 70–99)
Glucose-Capillary: 84 mg/dL (ref 70–99)
Glucose-Capillary: 103 mg/dL — ABNORMAL HIGH (ref 70–99)
Glucose-Capillary: 126 mg/dL — ABNORMAL HIGH (ref 70–99)

## 2020-09-02 LAB — CBC WITH DIFFERENTIAL/PLATELET
Abs Immature Granulocytes: 0.04 10*3/uL (ref 0.00–0.07)
Basophils Absolute: 0 10*3/uL (ref 0.0–0.1)
Basophils Relative: 1 %
Eosinophils Absolute: 0.1 10*3/uL (ref 0.0–0.5)
Eosinophils Relative: 1 %
HCT: 40.4 % (ref 36.0–46.0)
Hemoglobin: 12.7 g/dL (ref 12.0–15.0)
Immature Granulocytes: 1 %
Lymphocytes Relative: 17 %
Lymphs Abs: 1.5 10*3/uL (ref 0.7–4.0)
MCH: 28.6 pg (ref 26.0–34.0)
MCHC: 31.4 g/dL (ref 30.0–36.0)
MCV: 91 fL (ref 80.0–100.0)
Monocytes Absolute: 0.9 10*3/uL (ref 0.1–1.0)
Monocytes Relative: 10 %
Neutro Abs: 6.2 10*3/uL (ref 1.7–7.7)
Neutrophils Relative %: 70 %
Platelets: 229 10*3/uL (ref 150–400)
RBC: 4.44 MIL/uL (ref 3.87–5.11)
RDW: 16.1 % — ABNORMAL HIGH (ref 11.5–15.5)
WBC: 8.7 10*3/uL (ref 4.0–10.5)
nRBC: 0 % (ref 0.0–0.2)

## 2020-09-02 NOTE — Progress Notes (Signed)
  NEUROSURGERY PROGRESS NOTE   No issues overnight.   EXAM:  BP (!) 151/79   Pulse 87   Temp 98.9 F (37.2 C) (Axillary)   Resp (!) 22   Ht 5\' 4"  (1.626 m)   Wt 94.5 kg   SpO2 97%   BMI 35.76 kg/m   Awake, alert PERRL, tracks Spontaneous movementBUE Not follow commands Incision: c/d/i, Noevidence of infection  IMPRESSION/PLAN 59 y.o.females/p SAH and RMCA stroke requiring hemicraniectomy.  Small superficial wound dehiscencehealed - Cont supportive care - Dispo planning

## 2020-09-03 LAB — CBC WITH DIFFERENTIAL/PLATELET
Abs Immature Granulocytes: 0.03 10*3/uL (ref 0.00–0.07)
Basophils Absolute: 0.1 10*3/uL (ref 0.0–0.1)
Basophils Relative: 1 %
Eosinophils Absolute: 0.2 10*3/uL (ref 0.0–0.5)
Eosinophils Relative: 3 %
HCT: 37.1 % (ref 36.0–46.0)
Hemoglobin: 12.1 g/dL (ref 12.0–15.0)
Immature Granulocytes: 0 %
Lymphocytes Relative: 18 %
Lymphs Abs: 1.3 10*3/uL (ref 0.7–4.0)
MCH: 29.2 pg (ref 26.0–34.0)
MCHC: 32.6 g/dL (ref 30.0–36.0)
MCV: 89.6 fL (ref 80.0–100.0)
Monocytes Absolute: 1.1 10*3/uL — ABNORMAL HIGH (ref 0.1–1.0)
Monocytes Relative: 16 %
Neutro Abs: 4.4 10*3/uL (ref 1.7–7.7)
Neutrophils Relative %: 62 %
Platelets: 198 10*3/uL (ref 150–400)
RBC: 4.14 MIL/uL (ref 3.87–5.11)
RDW: 15.9 % — ABNORMAL HIGH (ref 11.5–15.5)
WBC: 7.1 10*3/uL (ref 4.0–10.5)
nRBC: 0 % (ref 0.0–0.2)

## 2020-09-03 LAB — GLUCOSE, CAPILLARY
Glucose-Capillary: 117 mg/dL — ABNORMAL HIGH (ref 70–99)
Glucose-Capillary: 84 mg/dL (ref 70–99)
Glucose-Capillary: 105 mg/dL — ABNORMAL HIGH (ref 70–99)
Glucose-Capillary: 87 mg/dL (ref 70–99)
Glucose-Capillary: 109 mg/dL — ABNORMAL HIGH (ref 70–99)

## 2020-09-03 NOTE — Progress Notes (Signed)
  NEUROSURGERY PROGRESS NOTE   No issues overnight.  Received call from nursing yesterday that daughter was requesting RUE xray due to "pain" with PROM.  EXAM:  BP (!) 163/84 (BP Location: Right Leg)   Pulse 95   Temp 98.5 F (36.9 C) (Axillary)   Resp 14   Ht 5\' 4"  (1.626 m)   Wt 94.7 kg   SpO2 98%   BMI 35.84 kg/m   Awake, alert PERRL, tracks Spontaneous movementBUE Not follow commands Incision: c/d/i, Noevidence of infection  RUE:  No swelling, redness, warmth No tenderness to palpation PROM performed with all joints. No wincing to any PROM.  IMPRESSION/PLAN 59 y.o.females/p SAH and RMCA stroke requiring hemicraniectomy.  Small superficial wound dehiscencehealed No indication for Xray RUE - Cont supportive care - Dispo planning

## 2020-09-04 LAB — CBC WITH DIFFERENTIAL/PLATELET
Abs Immature Granulocytes: 0.03 10*3/uL (ref 0.00–0.07)
Basophils Absolute: 0.1 10*3/uL (ref 0.0–0.1)
Basophils Relative: 1 %
Eosinophils Absolute: 0.3 10*3/uL (ref 0.0–0.5)
Eosinophils Relative: 3 %
HCT: 35.8 % — ABNORMAL LOW (ref 36.0–46.0)
Hemoglobin: 11.5 g/dL — ABNORMAL LOW (ref 12.0–15.0)
Immature Granulocytes: 0 %
Lymphocytes Relative: 19 %
Lymphs Abs: 1.6 10*3/uL (ref 0.7–4.0)
MCH: 28.8 pg (ref 26.0–34.0)
MCHC: 32.1 g/dL (ref 30.0–36.0)
MCV: 89.7 fL (ref 80.0–100.0)
Monocytes Absolute: 1.2 10*3/uL — ABNORMAL HIGH (ref 0.1–1.0)
Monocytes Relative: 14 %
Neutro Abs: 5.5 10*3/uL (ref 1.7–7.7)
Neutrophils Relative %: 63 %
Platelets: 214 10*3/uL (ref 150–400)
RBC: 3.99 MIL/uL (ref 3.87–5.11)
RDW: 15.9 % — ABNORMAL HIGH (ref 11.5–15.5)
WBC: 8.6 10*3/uL (ref 4.0–10.5)
nRBC: 0 % (ref 0.0–0.2)

## 2020-09-04 LAB — GLUCOSE, CAPILLARY
Glucose-Capillary: 106 mg/dL — ABNORMAL HIGH (ref 70–99)
Glucose-Capillary: 108 mg/dL — ABNORMAL HIGH (ref 70–99)
Glucose-Capillary: 108 mg/dL — ABNORMAL HIGH (ref 70–99)
Glucose-Capillary: 112 mg/dL — ABNORMAL HIGH (ref 70–99)
Glucose-Capillary: 132 mg/dL — ABNORMAL HIGH (ref 70–99)
Glucose-Capillary: 78 mg/dL (ref 70–99)
Glucose-Capillary: 91 mg/dL (ref 70–99)

## 2020-09-04 NOTE — Progress Notes (Signed)
  NEUROSURGERY PROGRESS NOTE   No issues overnight.  EXAM:  BP (!) 157/59 (BP Location: Left Leg)   Pulse 94   Temp 98.3 F (36.8 C) (Axillary)   Resp (!) 23   Ht 5\' 4"  (1.626 m)   Wt 94.7 kg   SpO2 97%   BMI 35.84 kg/m   Awake, alert,  Tracking ?follow simple commands including wiggling toes, moving thumb on left. Some spontaneous movement RLE, RUE  IMPRESSION:  60 y.o. 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

## 2020-09-05 LAB — CBC WITH DIFFERENTIAL/PLATELET
Abs Immature Granulocytes: 0.02 10*3/uL (ref 0.00–0.07)
Basophils Absolute: 0.1 10*3/uL (ref 0.0–0.1)
Basophils Relative: 1 %
Eosinophils Absolute: 0.3 10*3/uL (ref 0.0–0.5)
Eosinophils Relative: 4 %
HCT: 36.4 % (ref 36.0–46.0)
Hemoglobin: 11.9 g/dL — ABNORMAL LOW (ref 12.0–15.0)
Immature Granulocytes: 0 %
Lymphocytes Relative: 21 %
Lymphs Abs: 1.6 10*3/uL (ref 0.7–4.0)
MCH: 29.3 pg (ref 26.0–34.0)
MCHC: 32.7 g/dL (ref 30.0–36.0)
MCV: 89.7 fL (ref 80.0–100.0)
Monocytes Absolute: 1.1 10*3/uL — ABNORMAL HIGH (ref 0.1–1.0)
Monocytes Relative: 14 %
Neutro Abs: 4.5 10*3/uL (ref 1.7–7.7)
Neutrophils Relative %: 60 %
Platelets: 213 10*3/uL (ref 150–400)
RBC: 4.06 MIL/uL (ref 3.87–5.11)
RDW: 15.9 % — ABNORMAL HIGH (ref 11.5–15.5)
WBC: 7.5 10*3/uL (ref 4.0–10.5)
nRBC: 0 % (ref 0.0–0.2)

## 2020-09-05 LAB — GLUCOSE, CAPILLARY
Glucose-Capillary: 100 mg/dL — ABNORMAL HIGH (ref 70–99)
Glucose-Capillary: 109 mg/dL — ABNORMAL HIGH (ref 70–99)
Glucose-Capillary: 117 mg/dL — ABNORMAL HIGH (ref 70–99)
Glucose-Capillary: 120 mg/dL — ABNORMAL HIGH (ref 70–99)
Glucose-Capillary: 123 mg/dL — ABNORMAL HIGH (ref 70–99)
Glucose-Capillary: 96 mg/dL (ref 70–99)

## 2020-09-05 NOTE — Progress Notes (Signed)
  NEUROSURGERY PROGRESS NOTE   No issues overnight.   EXAM:  BP (!) 146/75 (BP Location: Left Arm)   Pulse (!) 104   Temp 98.9 F (37.2 C) (Axillary)   Resp 20   Ht 5\' 4"  (1.626 m)   Wt 94.7 kg   SpO2 95%   BMI 35.84 kg/m   Awake, alert PERRL, tracks Spontaneous movementBUE Not follow commands Incision: c/d/i, Noevidence of infection  IMPRESSION/PLAN 60 y.o. female s/p SAH and RMCA stroke requiring hemicraniectomy.  - Cont supportive care - Dispo planning

## 2020-09-06 LAB — CBC WITH DIFFERENTIAL/PLATELET
Abs Immature Granulocytes: 0.02 10*3/uL (ref 0.00–0.07)
Basophils Absolute: 0.1 10*3/uL (ref 0.0–0.1)
Basophils Relative: 1 %
Eosinophils Absolute: 0.3 10*3/uL (ref 0.0–0.5)
Eosinophils Relative: 4 %
HCT: 36.4 % (ref 36.0–46.0)
Hemoglobin: 11.8 g/dL — ABNORMAL LOW (ref 12.0–15.0)
Immature Granulocytes: 0 %
Lymphocytes Relative: 25 %
Lymphs Abs: 1.8 10*3/uL (ref 0.7–4.0)
MCH: 29.4 pg (ref 26.0–34.0)
MCHC: 32.4 g/dL (ref 30.0–36.0)
MCV: 90.8 fL (ref 80.0–100.0)
Monocytes Absolute: 1.1 10*3/uL — ABNORMAL HIGH (ref 0.1–1.0)
Monocytes Relative: 15 %
Neutro Abs: 4 10*3/uL (ref 1.7–7.7)
Neutrophils Relative %: 55 %
Platelets: 222 10*3/uL (ref 150–400)
RBC: 4.01 MIL/uL (ref 3.87–5.11)
RDW: 15.8 % — ABNORMAL HIGH (ref 11.5–15.5)
WBC: 7.2 10*3/uL (ref 4.0–10.5)
nRBC: 0 % (ref 0.0–0.2)

## 2020-09-06 LAB — GLUCOSE, CAPILLARY
Glucose-Capillary: 115 mg/dL — ABNORMAL HIGH (ref 70–99)
Glucose-Capillary: 95 mg/dL (ref 70–99)
Glucose-Capillary: 96 mg/dL (ref 70–99)
Glucose-Capillary: 100 mg/dL — ABNORMAL HIGH (ref 70–99)
Glucose-Capillary: 111 mg/dL — ABNORMAL HIGH (ref 70–99)
Glucose-Capillary: 130 mg/dL — ABNORMAL HIGH (ref 70–99)

## 2020-09-06 NOTE — Progress Notes (Signed)
Subjective: Patient reports not following commands. No acute events overnight.  Objective: Vital signs in last 24 hours: Temp:  [98.6 F (37 C)-99.1 F (37.3 C)] 99.1 F (37.3 C) (03/27 0338) Pulse Rate:  [86-113] 86 (03/27 0600) Resp:  [19-26] 20 (03/27 0600) BP: (117-146)/(74-87) 137/87 (03/27 0400) SpO2:  [96 %-100 %] 100 % (03/27 0600) FiO2 (%):  [21 %] 21 % (03/27 0340) Weight:  [93.1 kg] 93.1 kg (03/27 0500)  Intake/Output from previous day: 03/26 0701 - 03/27 0700 In: 1912 [NG/GT:1912] Out: 550 [Urine:550] Intake/Output this shift: No intake/output data recorded.  Physical Exam: Patient is alert and awake. She is not following commands. She does track me as I walk around the room. PERRL, EOMI. Spontaneous movement in her BUE. Incision is well approximated with no drainage, erythema, or edema.   Lab Results: Recent Labs    09/05/20 0214 09/06/20 0157  WBC 7.5 7.2  HGB 11.9* 11.8*  HCT 36.4 36.4  PLT 213 222   BMET No results for input(s): NA, K, CL, CO2, GLUCOSE, BUN, CREATININE, CALCIUM in the last 72 hours.  Studies/Results: No results found.  Assessment/Plan: IMPRESSION/PLAN 60 y.o. female who is s/p SAH and RMCA stroke requiring decompressive hemicraniectomy. Continue supportive care. Will need disposition planning.   LOS: 82 days     Council Mechanic, DNP, NP-C 09/06/2020, 8:34 AM

## 2020-09-07 LAB — GLUCOSE, CAPILLARY
Glucose-Capillary: 91 mg/dL (ref 70–99)
Glucose-Capillary: 87 mg/dL (ref 70–99)
Glucose-Capillary: 91 mg/dL (ref 70–99)
Glucose-Capillary: 99 mg/dL (ref 70–99)
Glucose-Capillary: 91 mg/dL (ref 70–99)

## 2020-09-07 LAB — CBC WITH DIFFERENTIAL/PLATELET
Abs Immature Granulocytes: 0.02 10*3/uL (ref 0.00–0.07)
Basophils Absolute: 0.1 10*3/uL (ref 0.0–0.1)
Basophils Relative: 1 %
Eosinophils Absolute: 0.3 10*3/uL (ref 0.0–0.5)
Eosinophils Relative: 5 %
HCT: 36.3 % (ref 36.0–46.0)
Hemoglobin: 11.5 g/dL — ABNORMAL LOW (ref 12.0–15.0)
Immature Granulocytes: 0 %
Lymphocytes Relative: 26 %
Lymphs Abs: 1.5 10*3/uL (ref 0.7–4.0)
MCH: 28.7 pg (ref 26.0–34.0)
MCHC: 31.7 g/dL (ref 30.0–36.0)
MCV: 90.5 fL (ref 80.0–100.0)
Monocytes Absolute: 0.8 10*3/uL (ref 0.1–1.0)
Monocytes Relative: 14 %
Neutro Abs: 3.1 10*3/uL (ref 1.7–7.7)
Neutrophils Relative %: 54 %
Platelets: 224 10*3/uL (ref 150–400)
RBC: 4.01 MIL/uL (ref 3.87–5.11)
RDW: 15.4 % (ref 11.5–15.5)
WBC: 5.9 10*3/uL (ref 4.0–10.5)
nRBC: 0 % (ref 0.0–0.2)

## 2020-09-07 NOTE — Progress Notes (Signed)
Nutrition Follow-up  DOCUMENTATION CODES:   Obesity unspecified  INTERVENTION:  Continue Tube Feeding via PEG:  Osmolite 1.2 @ 55 ml/hr (1320 ml) 90 ml ProSource BID  Provides: 1744 kcal, 117 grams protein, 208 grams carbohydrate, and 1070 ml free water.   200 ml free water every 6 hours Total free water: 1870 ml   NUTRITION DIAGNOSIS:   Inadequate oral intake related to inability to eat as evidenced by NPO status. Ongoing.   GOAL:   Patient will meet greater than or equal to 90% of their needs Met with TF.   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 Postoperative course complicated by seizure activity.   1/5 cortrak placed (tip gastric)  1/7 hemicraniectomy 1/19 s/p trach and PEG placement  2/8 trach changed to #6 shiley flex  Per MD, pt remains stable with global aphasia and right hemiparesis. Continues to await discharge. Pt continues to tolerate TC and TF via PEG. Current orders: Osmolite 1.2 @ 63ml/hr with 12ml Prosource TF BID and 221ml free water flushes Q6H  UOP: 1412ml x24 hours  Admission weight: 100 kg Current weight: 94.8 kg  Labs and medications reviewed.  CBGs 38-38-18  Diet Order:   Diet Order    None     EDUCATION NEEDS:   No education needs have been identified at this time  Skin:  Skin Assessment: Reviewed RN Assessment  Last BM:  3/28 type 4  Height:   Ht Readings from Last 1 Encounters:  06/30/20 $RemoveB'5\' 4"'uUxthZYm$  (1.626 m)    Weight:   Wt Readings from Last 1 Encounters:  09/07/20 94.8 kg    Ideal Body Weight:  54.4 kg  BMI:  Body mass index is 35.87 kg/m.  Estimated Nutritional Needs:   Kcal:  1600-1800  Protein:  105-125 grams  Fluid:  >1.6 L/day  Larkin Ina, MS, RD, LDN RD  pager number and weekend/on-call pager number located in [place].

## 2020-09-07 NOTE — Progress Notes (Signed)
CSW spoke with Denise Savage at [facility] - she is agreeable to review referral. CSW sent secure email with referral attached.  [name], MSW, LCSW Transitions of Care  Clinical Social Worker II [phone]

## 2020-09-07 NOTE — NC FL2 (Signed)
[place] LEVEL OF CARE SCREENING TOOL     IDENTIFICATION  Patient Name: Denise Savage Birthdate: [birth year] Sex: female Admission Date (Current Location): 06/15/2020  [facility] and [place] Number:  Herbalist and Address:  [facility] [place], [place] [address], [place], [place] [address]      Provider Number: [id number]  Attending Physician Name and Address:  Consuella Lose, MD  Relative Name and Phone Number:  Riki Sheer Daughter   [phone]    Current Level of Care: Hospital Recommended Level of Care: [place] Prior Approval Number:    Date Approved/Denied:   PASRR Number: [id number]  Discharge Plan: SNF    Current Diagnoses: Patient Active Problem List   Diagnosis Date Noted  . Acute respiratory failure ([place])   . ICH (intracerebral hemorrhage) ([place]) 06/18/2020  . Aneurysmal subarachnoid hemorrhage ([place]) 06/16/2020  . SAH (subarachnoid hemorrhage) ([place]) 06/15/2020    Orientation RESPIRATION BLADDER Height & Weight      (Unable to follow to commands but is awake)  Tracheostomy (Trach collar on room air) Incontinent,External catheter Weight: 208 lb 15.9 oz (94.8 kg) Height:  $Remove'5\' 4"'KgVcHHm$  (162.6 cm)  BEHAVIORAL SYMPTOMS/MOOD NEUROLOGICAL BOWEL NUTRITION STATUS      Incontinent Feeding tube  AMBULATORY STATUS COMMUNICATION OF NEEDS Skin   Total Care Non-Verbally Surgical wounds                       Personal Care Assistance Level of Assistance  Bathing,Feeding,Dressing,Total care Bathing Assistance: Maximum assistance Feeding assistance: Maximum assistance Dressing Assistance: Maximum assistance Total Care Assistance: Maximum assistance   Functional Limitations Info  Sight,Hearing,Speech Sight Info: Adequate Hearing Info: Adequate Speech Info: Impaired    SPECIAL CARE FACTORS FREQUENCY  PT (By licensed PT),OT (By licensed OT)     PT Frequency: 5x weekly OT Frequency: 5x weekly             Contractures Contractures Info: Not present    Additional Factors Info  Allergies,Code Status Code Status Info: Full Allergies Info: Hydrocodone           Current Medications (09/07/2020):  This is the current hospital active medication list Current Facility-Administered Medications  Medication Dose Route Frequency Provider Last Rate Last Admin  . acetaminophen (TYLENOL) tablet 650 mg  650 mg Per Tube Q4H PRN Judith Part, MD   650 mg at 09/04/20 0020   Or  . acetaminophen (TYLENOL) suppository 650 mg  650 mg Rectal Q4H PRN Judith Part, MD      . chlorhexidine gluconate (MEDLINE KIT) (PERIDEX) 0.12 % solution 15 mL  15 mL Mouth Rinse BID Judith Part, MD   15 mL at 09/07/20 2035  . Chlorhexidine Gluconate Cloth 2 % PADS 6 each  6 each Topical Q0600 Greta Doom, MD   6 each at 09/07/20 [phone]  . dextrose 50 % solution 25-50 mL  25-50 mL Intravenous Q4H PRN Jacky Kindle, MD      . diphenhydrAMINE (BENADRYL) 12.5 MG/5ML elixir 25 mg  25 mg Per Tube Q12H PRN Dawley, Troy C, DO   25 mg at 08/31/20 0104  . feeding supplement (OSMOLITE 1.2 CAL) liquid 1,000 mL  1,000 mL Per Tube Continuous Kipp Brood, MD 55 mL/hr at 09/06/20 0600 Infusion Verify at 09/06/20 0600  . feeding supplement (PROSource TF) liquid 90 mL  90 mL Per Tube BID Kipp Brood, MD   90 mL at 09/07/20 0838  . free  water 200 mL  200 mL Per Tube Q6H Jacky Kindle, MD   200 mL at 09/07/20 0539  . Gerhardt's butt cream   Topical BID Consuella Lose, MD   Given at 09/07/20 [phone]  . guaiFENesin (ROBITUSSIN) 100 MG/5ML solution 300 mg  15 mL Per Tube Q4H Jennelle Human B, NP   300 mg at 09/07/20 0839  . heparin injection 5,000 Units  5,000 Units Subcutaneous Q8H Kipp Brood, MD   5,000 Units at 09/07/20 0538  . HYDROcodone-acetaminophen (NORCO/VICODIN) 5-325 MG per tablet 1 tablet  1 tablet Per Tube Q4H PRN Joselyn Glassman A, RPH   1 tablet at 09/05/20 2220  . ipratropium-albuterol (DUONEB)  0.5-2.5 (3) MG/3ML nebulizer solution 3 mL  3 mL Nebulization Q4H PRN Jennelle Human B, NP      . labetalol (NORMODYNE) injection 10-40 mg  10-40 mg Intravenous Q10 min PRN Judith Part, MD   20 mg at 09/04/20 0020  . lacosamide (VIMPAT) oral solution 50 mg  50 mg Per Tube BID Joselyn Glassman A, RPH   50 mg at 09/07/20 0840  . levETIRAcetam (KEPPRA) 100 MG/ML solution 1,500 mg  1,500 mg Per Tube BID Rigoberto Noel, MD   1,500 mg at 09/07/20 0839  . LORazepam (ATIVAN) injection 2 mg  2 mg Intravenous PRN Lora Havens, MD   2 mg at 08/11/20 1414  . MEDLINE mouth rinse  15 mL Mouth Rinse 10 times per day Judith Part, MD   15 mL at 09/07/20 0839  . modafinil (PROVIGIL) tablet 100 mg  100 mg Per Tube Daily Darrick Meigs, Rylee, MD   100 mg at 09/07/20 0840  . ondansetron (ZOFRAN) tablet 4 mg  4 mg Per Tube Q4H PRN Levada Dy, Dwayne A, RPH       Or  . ondansetron (ZOFRAN) injection 4 mg  4 mg Intravenous Q4H PRN Joselyn Glassman A, RPH      . pantoprazole sodium (PROTONIX) 40 mg/20 mL oral suspension 40 mg  40 mg Per Tube Daily Judith Part, MD   40 mg at 09/07/20 5462  . polyethylene glycol (MIRALAX / GLYCOLAX) packet 17 g  17 g Per Tube Daily PRN Levada Dy, Dwayne A, RPH      . valproic acid (DEPAKENE) 250 MG/5ML solution 750 mg  750 mg Per Tube Q8H Pham, Minh Q, RPH-CPP   750 mg at 09/07/20 7035     Discharge Medications: Please see discharge summary for a list of discharge medications.  Relevant Imaging Results:  Relevant Lab Results:   Additional Information SSN [id number]  Archie Endo, LCSW

## 2020-09-07 NOTE — Progress Notes (Signed)
  NEUROSURGERY PROGRESS NOTE   No issues overnight.   EXAM:  BP 136/84 (BP Location: Right Arm)   Pulse 89   Temp 98.6 F (37 C) (Oral)   Resp 20   Ht 5\' 4"  (1.626 m)   Wt 94.8 kg   SpO2 94%   BMI 35.87 kg/m   Sleeping, awakened to voice PERRL, tracks Spontaneous movementBUE Not follow commands Incision: c/d/i, Noevidence of infection   IMPRESSION/PLAN 60 y.o. female s/p SAH and RMCA stroke requiring hemicraniectomy.  - Cont supportive care - Dispo planning

## 2020-09-07 NOTE — Plan of Care (Signed)

## 2020-09-08 LAB — GLUCOSE, CAPILLARY
Glucose-Capillary: 102 mg/dL — ABNORMAL HIGH (ref 70–99)
Glucose-Capillary: 106 mg/dL — ABNORMAL HIGH (ref 70–99)
Glucose-Capillary: 110 mg/dL — ABNORMAL HIGH (ref 70–99)
Glucose-Capillary: 117 mg/dL — ABNORMAL HIGH (ref 70–99)
Glucose-Capillary: 120 mg/dL — ABNORMAL HIGH (ref 70–99)
Glucose-Capillary: 84 mg/dL (ref 70–99)
Glucose-Capillary: 99 mg/dL (ref 70–99)

## 2020-09-08 LAB — CBC WITH DIFFERENTIAL/PLATELET
Abs Immature Granulocytes: 0.02 10*3/uL (ref 0.00–0.07)
Basophils Absolute: 0.1 10*3/uL (ref 0.0–0.1)
Basophils Relative: 1 %
Eosinophils Absolute: 0.3 10*3/uL (ref 0.0–0.5)
Eosinophils Relative: 4 %
HCT: 36.4 % (ref 36.0–46.0)
Hemoglobin: 11.9 g/dL — ABNORMAL LOW (ref 12.0–15.0)
Immature Granulocytes: 0 %
Lymphocytes Relative: 25 %
Lymphs Abs: 1.6 10*3/uL (ref 0.7–4.0)
MCH: 29.6 pg (ref 26.0–34.0)
MCHC: 32.7 g/dL (ref 30.0–36.0)
MCV: 90.5 fL (ref 80.0–100.0)
Monocytes Absolute: 0.9 10*3/uL (ref 0.1–1.0)
Monocytes Relative: 14 %
Neutro Abs: 3.5 10*3/uL (ref 1.7–7.7)
Neutrophils Relative %: 56 %
Platelets: 217 10*3/uL (ref 150–400)
RBC: 4.02 MIL/uL (ref 3.87–5.11)
RDW: 15.4 % (ref 11.5–15.5)
WBC: 6.3 10*3/uL (ref 4.0–10.5)
nRBC: 0 % (ref 0.0–0.2)

## 2020-09-08 LAB — SARS CORONAVIRUS 2 (TAT 6-24 HRS): SARS Coronavirus 2: NEGATIVE

## 2020-09-08 MED ORDER — VALPROIC ACID 250 MG/5ML PO SOLN: 750.0000 mg | Freq: Three times a day (TID) | ORAL | 0 refills | Status: DC

## 2020-09-08 MED ORDER — LACOSAMIDE 10 MG/ML PO SOLN: 50.0000 mg | Freq: Two times a day (BID) | ORAL | 0 refills | Status: DC

## 2020-09-08 MED ORDER — LEVETIRACETAM 100 MG/ML PO SOLN: 1500.0000 mg | Freq: Two times a day (BID) | ORAL | 12 refills | Status: DC

## 2020-09-08 NOTE — Discharge Summary (Addendum)
Physician Discharge Summary  Patient ID: Denise Savage MRN: [id number] DOB/AGE: Sep 14, 1960 60 y.o.  Admit date: 06/15/2020 Discharge date: 09/10/2020  Admission Diagnoses:  SAH, ruptured aneurysm  Discharge Diagnoses:  Same Active Problems:   SAH (subarachnoid hemorrhage) (HCC)   Aneurysmal subarachnoid hemorrhage (HCC)   ICH (intracerebral hemorrhage) (HCC)   Acute respiratory failure [facility])   Discharged Condition: Stable  Hospital Course:  Denise Savage is a 60 y.o. female who presented to [facility] 06/15/2020 for headache x 5 days found to have left sylvian fissure subarachnoid hemorrhage with a left MCA bifurcation aneurysm. [facility]. She underwent diagnostic cerebral angiogram and coiling of aneurysm. Post operative course complicated by: - MCA infarct, s/p craniectomy with bone flap in abdomen. Profound paraparesis. Will need cranioplasty in approximately 3 months. - subclinical seizures: now on 3 AED per neurology.  - pneumonia, UTI s/p abx - respiratory failure: s/p trach 1/19, now on trach collar. PCCM has been following patient. They do not believe patient is a candidate for deccannulation at this time - dysphagia: s/p PEG. Continue NPO - superficial wound infection of crani site: s/p Bactrim x10 days. No further evidence of infection   She worked with PT/OT/SLP. Rec for SNF. She was unfortunate a difficult to place due to lack of insurance and trach status and was in the hospital for 86 days. A bed was offered in [place], [place]. Family updated. Patient transferred 3/31 in hemodynamically stable condition.  Treatments:  1/4 diagnostic angiogram with coiling 1/7 left hemicraniectomy 1/7 R IJ central line >> out 1/19 PEG tube 1/19 tracheostomy  Discharge Exam: Blood pressure 123/90, pulse 96, temperature 98.9 F (37.2 C), temperature source Axillary, resp. rate 20, height 5\' 4"  (1.626 m), weight 97.6 kg, SpO2 95 %. Sleeping, awakened to  voice PERRL, tracks Followed commands wiggling bl feet Not follow commands Incision: c/d/i, Noevidence of infection   Disposition: Discharge disposition: 03-Skilled Nursing Facility       Discharge Instructions    No wound care   Complete by: As directed      Allergies as of 09/10/2020      Reactions   Hydrocodone Nausea Only      Medication List    STOP taking these medications   acetaminophen 500 MG tablet Commonly known as: TYLENOL   APPLE CIDER VINEGAR PO   Calcium 150 MG Tabs   CENTRUM ADULTS PO   doxylamine (Sleep) 25 MG tablet Commonly known as: UNISOM   etodolac 400 MG tablet Commonly known as: Lodine   EXCEDRIN PO   naproxen sodium 220 MG tablet Commonly known as: ALEVE   Turmeric Curcumin Caps   vitamin C 100 MG tablet   Vitamin D3 1.25 MG (50000 UT) Caps   zinc gluconate 50 MG tablet     TAKE these medications   lacosamide 10 MG/ML oral solution Commonly known as: VIMPAT Take 5 mLs (50 mg total) by mouth 2 (two) times daily.   levETIRAcetam 100 MG/ML solution Commonly known as: KEPPRA Place 15 mLs (1,500 mg total) into feeding tube 2 (two) times daily.   valproic acid 250 MG/5ML solution Commonly known as: DEPAKENE Place 15 mLs (750 mg total) into feeding tube every 8 (eight) hours.       Follow-up Information    09/12/2020, MD. Schedule an appointment as soon as possible for a visit in 2 month(s).   Specialty: Neurosurgery Contact information: [address] [place] [phone]  Signed: Darci Current Amritha Yorke 09/10/2020, 7:45 AM

## 2020-09-08 NOTE — Progress Notes (Addendum)
11:30am: CSW and RN CM visited with patient at bedside - patient awake but did not engage with staff. CSW and RN CM informed RN of plan for discharge to [facility] in [place] with an anticipated discharge date of 09/10/20.  CSW will complete LOG and send to [facility] administration for approval.  8:30am: CSW received return call from [place] at [facility] in [place] - she can offer the patient a LOG bed, bed offer will be presented to the patient's daughters.  [name], MSW, LCSW Transitions of Care  Clinical Social Worker II [phone]

## 2020-09-08 NOTE — Progress Notes (Signed)
NAME:  Denise Savage, MRN:  [id number], DOB:  February 15, 1961, LOS: 84 ADMISSION DATE:  06/15/2020, CONSULTATION DATE:  06/16/20 REFERRING MD:  Dr. Maurice Small, CHIEF COMPLAINT:  VDRF   Brief History:  60 year old female presented headache x 5 days found to have left sylvian fissure subarachnoid hemorrhage with a left MCA bifurcation aneurysm.  Transferred to [facility] for further care and went for diagnostic angiography with coiling, complicated by intraoperative rupture and subsequent severe vasospasm leading to malignant left MCA stroke.  She returned to the ICU intubated.  Past Medical History:   has a past medical history of Abdominal hernia.  Significant Hospital Events:  1/3 present to [facility] emergency department 1/4 transferred to [facility], NSGY admitting/ taken to neuro IR for coiling.  Remained on vent postop 1/5 more lethargic and unresponsive, stat head CT was done which showed progressive left MCA infarct with brain edema and 6 mm midline shift.  Persistent nonprogressive subarachnoid hemorrhage. 1/6 worsening edema on CT, 3% saline continued 1/7 Keppra increased for subclinical seizures on LTM EEG 1/8 Low-grade febrile 100.9 1/12 modafinil started 1/26 Seizure-like activity >>Given Ativan, LTM EEG hooked up, keprra increased 1/27 seizure activity noted on EEG 1/28 tolerated PSV at 10/5 all day 2/14 down to 5 L trach collar and 28% FiO2 3/29 PCCM called back to re-eval re: could she be candidate for decannulation   Consults:  Neuro IR PCCM  Procedures:  1/4 diagnostic angiogram with coiling 1/7 left hemicraniectomy 1/7 R IJ central line >> out 1/19 PEG tube 1/19 tracheostomy   Interim History / Subjective:  No distress.    Objective   Blood pressure 110/64, pulse 85, temperature 98.4 F (36.9 C), temperature source Axillary, resp. rate (Abnormal) 23, height 5\' 4"  (1.626 m), weight 94.8 kg, SpO2 97 %.    FiO2 (%):  [21 %] 21 %   Intake/Output Summary (Last 24  hours) at 09/08/2020 1131 Last data filed at 09/08/2020 0112 Gross per 24 hour  Intake 2065 ml  Output 450 ml  Net 1615 ml   Filed Weights   09/03/20 0500 09/06/20 0500 09/07/20 0500  Weight: 94.7 kg 93.1 kg 94.8 kg    General this is a 60 year old female still trach dependent  HENT 6 cuffless trach good cough. Not able to phonate currently on atc Pulm clear has occ rhonchi that improve w/ cough Card rrr abd soft PEG in place Neuro awake. Tracks me in the room. She readily localizes w/ the left upper. Has some weakness of the RUE. Moving LEs independently. Will not follow commands. Not verbal Ext warm and dry    Resolved Hospital Problem list   Sepsis Aneurysmal subarachnoid hemorrhage due to ruptured left MCA aneurysm s/p coiling.  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  Current Oxygen needs: ATC 21 % Current trach: 6 cuffless.  I really don't think from my exam she can reliably protect her airway. Her cough is fairly strong but I think the risk for airway compromise fairly high as given she is not handling her oral secretions. I had to suction her mouth free of sputum during this visit. With more time  she may continue to improve but at this point I still do not think we are at a point we can safely decannulate.   Plan/rec Cont ATC w/ routine trach change q 12 weeks (I would NOT decannulate or down-size at this time). If she is actually going to SNF today would recommend SLP eval as out-pt. Would be reasonable to assess for supervised PMV trials but should  not be left unsupervised w/ PMV in place.   We can continue to see weekly while still here.   Simonne Martinet ACNP-BC [facility] Pulmonary/Critical Care Pager # [phone] OR # [phone] if no answer

## 2020-09-08 NOTE — Progress Notes (Signed)
  NEUROSURGERY PROGRESS NOTE   No issues overnight.   EXAM:  BP 110/64 (BP Location: Left Arm)   Pulse 85   Temp 98.4 F (36.9 C) (Axillary)   Resp (!) 23   Ht 5\' 4"  (1.626 m)   Wt 94.8 kg   SpO2 97%   BMI 35.87 kg/m   Sleeping, awakened to voice PERRL, tracks Followed commands wiggling bl feet Not follow commands Incision: c/d/i, Noevidence of infection   IMPRESSION/PLAN 60 y.o. female s/p SAH and RMCA stroke requiring hemicraniectomy.  - Cont supportive care - Dispo planning. Possibly has bed available in [place]. Okay for discharge.

## 2020-09-08 NOTE — TOC Progression Note (Addendum)
Transition of Care [facility]) - Progression Note    Patient Details  Name: Denise Savage MRN: [id number] Date of Birth: [birth year]  Transition of Care [facility]) CM/SW Contact  [name], RN Phone Number: 09/08/2020, 8:52 AM  Clinical Narrative:    Case management spoke with [name], MSW this morning and [facility] in [place], [place] has offered a LTC bed to the patient at the facility.  I called and spoke with the patient's daughters, [name] and [name], on the phone regarding the bed offer and both daughters were concerned and hesitant about accepting the bed offer since the facility is more than 2 hours away from [place], [place].  The daughter states that she would like to speak to the medical provider today concerning decannulation of the trach.  I left a secure message with Dr. Conchita Paris regarding the matter since the family would like a medical update and have questions regarding the trach and disposition to a rehabilitation facility in [place], [place].  CM and MSW will continue to follow the patient for SNF placement.  09/08/2020 1102 - I called and spoke with [name], Neurosurgery PA on the phone regarding family's request to speak to the medical team.  The daughter, [name] expressed that she is hesitant to send her mother to a SNF facility far away from home and had questions concerning the patient's trach being de-cannulated at some point.  [name], PA confirmed that he has spoken with the family frequently throughout the week regarding the patient's progress and need for [facility] placement.  [name], PA states that he would explain to the family that the patient was medically stable to discharge to a facility and if the patient's family declined - them DTP team could speak with the family to discuss discharging home with home health services in stead.  No other SNF facilities in the [place], [place] area have offer the patient a bed offer.  09/08/2020 1158 - CM spoke  with [name], PA on the phone and he spoke with the patient's daughter, [name] on the phone in regards to offered bed at [facility] in [place], [place].  I spoke with the RN at the bedside and she is going to collect a COVID screen on the patient, in hopes that the family is willing accept the bed offer for the facility and arrangement to the facility by PTAR can be arranged this week for admission.  The patient will continue to need the trach at this time to maintain her airway and respiratory status.  At this time, [name] is speaking with the family regarding patient's bed offer and I will follow up with her this afternoon regarding transitions of care needs.  09/08/2020 1430 - CM spoke with [name], daughter on the phone and she was in agreement for SNF placement at [facility] in [place], [place].  I spoke with  [name], CM at [facility] and she will call the daughter and sign the patient in to the facility over the phone/email.  CM and MSW are in the process of coordinating log paperwork for both admission and transport via PTAR.  The patient's daughter is aware.  The facility is ready to accept the patient on 09/10/2020 and patient will need to arrive to the facility by 3 pm in the afternoon by ambulance for admission assessment. [name], MSW supervisor is aware and will approve appropriate paperwork for transport and admission so that PTAR can be set up.    Expected Discharge Plan: Skilled Nursing  Facility Barriers to Discharge: Continued Medical Work up,SNF Pending payor source - LOG  Expected Discharge Plan and Services Expected Discharge Plan: Skilled Nursing Facility In-house Referral: Clinical Social Work Discharge Planning Services: CM Consult Post Acute Care Choice: Skilled Nursing Facility Living arrangements for the past 2 months: Single Family Home                                       Social Determinants of Health (SDOH) Interventions     Readmission Risk Interventions Readmission Risk Prevention Plan 09/08/2020  Transportation Screening Complete  PCP or Specialist Appt within 5-7 Days Complete  Home Care Screening Complete  Medication Review (RN CM) Complete  Some recent data might be hidden

## 2020-09-09 LAB — CBC WITH DIFFERENTIAL/PLATELET
Abs Immature Granulocytes: 0.02 10*3/uL (ref 0.00–0.07)
Basophils Absolute: 0 10*3/uL (ref 0.0–0.1)
Basophils Relative: 1 %
Eosinophils Absolute: 0.3 10*3/uL (ref 0.0–0.5)
Eosinophils Relative: 4 %
HCT: 40.3 % (ref 36.0–46.0)
Hemoglobin: 12.5 g/dL (ref 12.0–15.0)
Immature Granulocytes: 0 %
Lymphocytes Relative: 24 %
Lymphs Abs: 1.5 10*3/uL (ref 0.7–4.0)
MCH: 28.9 pg (ref 26.0–34.0)
MCHC: 31 g/dL (ref 30.0–36.0)
MCV: 93.1 fL (ref 80.0–100.0)
Monocytes Absolute: 0.8 10*3/uL (ref 0.1–1.0)
Monocytes Relative: 13 %
Neutro Abs: 3.5 10*3/uL (ref 1.7–7.7)
Neutrophils Relative %: 58 %
Platelets: 247 10*3/uL (ref 150–400)
RBC: 4.33 MIL/uL (ref 3.87–5.11)
RDW: 15.4 % (ref 11.5–15.5)
WBC: 6.1 10*3/uL (ref 4.0–10.5)
nRBC: 0 % (ref 0.0–0.2)

## 2020-09-09 LAB — GLUCOSE, CAPILLARY
Glucose-Capillary: 112 mg/dL — ABNORMAL HIGH (ref 70–99)
Glucose-Capillary: 111 mg/dL — ABNORMAL HIGH (ref 70–99)
Glucose-Capillary: 113 mg/dL — ABNORMAL HIGH (ref 70–99)
Glucose-Capillary: 116 mg/dL — ABNORMAL HIGH (ref 70–99)
Glucose-Capillary: 123 mg/dL — ABNORMAL HIGH (ref 70–99)
Glucose-Capillary: 116 mg/dL — ABNORMAL HIGH (ref 70–99)

## 2020-09-09 NOTE — Progress Notes (Signed)
  NEUROSURGERY PROGRESS NOTE   No issues overnight.  EXAM:  BP 133/72 (BP Location: Left Arm)   Pulse 93   Temp 98.6 F (37 C) (Axillary)   Resp 17   Ht 5\' 4"  (1.626 m)   Wt 94.8 kg   SpO2 98%   BMI 35.87 kg/m   Awake, alert PERRL, tracks Followed commands wiggling bl feet ?nodding head appropriately although inconsistently Incision: c/d/i, Noevidence of infection  IMPRESSION/PLAN 60 y.o. female s/pSAH and RMCA stroke requiring hemicraniectomy.  - Cont supportive care  CSW/CM have received a bed offer in [place], [place]. CM updated daughter, [place]. I also personally called [name] twice yesterday to discuss the situation. While [name] is further than family would obviously like, this is her only option at this time given lack of insurance and trach status. [name] did ask about possible decannulation. Patient is not consistently following commands. I advised her it would likely not be possible. I did call CCM to evaluate the patient and they agree, she is not a candidate for decannulation. I appreciate their assistance. I again relayed this message to [name]. She is obviously upset patient will be placed in [place], but [name] tells me she was thinking about moving towards that area anyway. CM/CSW updated. Plan for discharge, likely tomorrow.

## 2020-09-09 NOTE — Plan of Care (Signed)
  Problem: Education: Goal: Knowledge of disease or condition will improve Outcome: Not Progressing Goal: Knowledge of secondary prevention will improve Outcome: Not Progressing Goal: Knowledge of patient specific risk factors addressed and post discharge goals established will improve Outcome: Not Progressing Goal: Individualized Educational Video(s) Outcome: Not Progressing   Problem: Coping: Goal: Will verbalize positive feelings about self Outcome: Not Progressing Goal: Will identify appropriate support needs Outcome: Not Progressing   Problem: Health Behavior/Discharge Planning: Goal: Ability to manage health-related needs will improve Outcome: Not Progressing   Problem: Self-Care: Goal: Ability to participate in self-care as condition permits will improve Outcome: Not Progressing Goal: Verbalization of feelings and concerns over difficulty with self-care will improve Outcome: Not Progressing Goal: Ability to communicate needs accurately will improve Outcome: Not Progressing   Problem: Nutrition: Goal: Risk of aspiration will decrease Outcome: Not Progressing Goal: Dietary intake will improve Outcome: Not Progressing   Problem: Education: Goal: Knowledge of General Education information will improve Description: Including pain rating scale, medication(s)/side effects and non-pharmacologic comfort measures Outcome: Not Progressing   Problem: Health Behavior/Discharge Planning: Goal: Ability to manage health-related needs will improve Outcome: Not Progressing   Problem: Clinical Measurements: Goal: Ability to maintain clinical measurements within normal limits will improve Outcome: Not Progressing Goal: Will remain free from infection Outcome: Not Progressing Goal: Diagnostic test results will improve Outcome: Not Progressing Goal: Respiratory complications will improve Outcome: Not Progressing Goal: Cardiovascular complication will be avoided Outcome: Not  Progressing   Problem: Activity: Goal: Risk for activity intolerance will decrease Outcome: Not Progressing   Problem: Nutrition: Goal: Adequate nutrition will be maintained Outcome: Not Progressing   Problem: Elimination: Goal: Will not experience complications related to bowel motility Outcome: Not Progressing Goal: Will not experience complications related to urinary retention Outcome: Not Progressing   Problem: Pain Managment: Goal: General experience of comfort will improve Outcome: Not Progressing   Problem: Safety: Goal: Ability to remain free from injury will improve Outcome: Not Progressing   Problem: Skin Integrity: Goal: Risk for impaired skin integrity will decrease Outcome: Not Progressing

## 2020-09-09 NOTE — Progress Notes (Addendum)
10:30am: CSW received confirmation from Zack that the PTAR ride has been approved. TOC will arrange for pick up early tomorrow morning.  8:15am: CSW requested assistance from [facility] Director Zack Shon Baton to schedule charity PTAR ride to [place] tomorrow - Zack to speak with staff at [facility] to arrange ride.  [name], MSW, LCSW Transitions of Care  Clinical Social Worker II [phone]

## 2020-09-10 LAB — CBC WITH DIFFERENTIAL/PLATELET
Abs Immature Granulocytes: 0.03 10*3/uL (ref 0.00–0.07)
Basophils Absolute: 0.1 10*3/uL (ref 0.0–0.1)
Basophils Relative: 1 %
Eosinophils Absolute: 0.3 10*3/uL (ref 0.0–0.5)
Eosinophils Relative: 5 %
HCT: 38.6 % (ref 36.0–46.0)
Hemoglobin: 12.3 g/dL (ref 12.0–15.0)
Immature Granulocytes: 1 %
Lymphocytes Relative: 30 %
Lymphs Abs: 1.7 10*3/uL (ref 0.7–4.0)
MCH: 29.5 pg (ref 26.0–34.0)
MCHC: 31.9 g/dL (ref 30.0–36.0)
MCV: 92.6 fL (ref 80.0–100.0)
Monocytes Absolute: 0.8 10*3/uL (ref 0.1–1.0)
Monocytes Relative: 14 %
Neutro Abs: 2.8 10*3/uL (ref 1.7–7.7)
Neutrophils Relative %: 49 %
Platelets: 240 10*3/uL (ref 150–400)
RBC: 4.17 MIL/uL (ref 3.87–5.11)
RDW: 15.5 % (ref 11.5–15.5)
WBC: 5.7 10*3/uL (ref 4.0–10.5)
nRBC: 0 % (ref 0.0–0.2)

## 2020-09-10 LAB — GLUCOSE, CAPILLARY
Glucose-Capillary: 96 mg/dL (ref 70–99)
Glucose-Capillary: 98 mg/dL (ref 70–99)

## 2020-09-10 NOTE — Progress Notes (Signed)
CSW sent discharge summary to [place] at [facility].  RN CM spoke with patient's daughter [name] to inform her of discharge plan - admissions paperwork will be obtained from daughter and sent to facility.  CSW arranged for PTAR transport with a pickup time of 10am. The number to call for report is [phone].  CSW made RN aware of discharge plan.  [name], MSW, LCSW Transitions of Care  Clinical Social Worker II [phone]

## 2020-09-10 NOTE — TOC Transition Note (Signed)
Transition of Care [facility]) - CM/SW Discharge Note   Patient Details  Name: Denise Savage MRN: [id number] Date of Birth: 09-Oct-1960  Transition of Care [facility]) CM/SW Contact:  Curlene Labrum, RN Phone Number: 09/10/2020, 9:45 AM   Clinical Narrative:    Case management met with the patient and daughter, Denise Savage, at the bedside to discuss transitions of care to [facility] today.  PTAR will be transporting the patient to [facility] in [place], [place] and daughter is aware and is at bedside this morning.  The patient's daughter, Denise Savage, brought signed admission paperwork for the skilled nursing facility and the documents were placed in the packet to transfer with the patient on the ambulance.  Denise Single, PA has signed and updated discharge summary and it was placed in the [place]. Packet.  PTAR was called by Madilyn Fireman this morning and patient will transfer to the facility once the ambulance arrives.   Final next level of care: Skilled Nursing Facility Barriers to Discharge: Continued Medical Work up,SNF Pending payor source - LOG   Patient Goals and CMS Choice Patient states their goals for this hospitalization and ongoing recovery are:: Family needs skilled nursing facility for the patient for 24 hour care. CMS Medicare.gov Compare Post Acute Care list provided to:: Patient Represenative (must comment) Raina Mina, daughter - [phone]) Choice offered to / list presented to : Adult Children  Discharge Placement                       Discharge Plan and Services In-house Referral: Clinical Social Work Discharge Planning Services: CM Consult Post Acute Care Choice: [facility]                               Social Determinants of Health (SDOH) Interventions     Readmission Risk Interventions Readmission Risk Prevention Plan 09/08/2020  Transportation Screening Complete  PCP or Specialist Appt within 5-7 Days Complete   Home Care Screening Complete  Medication Review (RN CM) Complete  Some recent data might be hidden

## 2020-09-10 NOTE — Progress Notes (Signed)
Handoff report given to Selena Batten, RN at receiving facility at 1250.

## 2020-09-10 NOTE — Progress Notes (Signed)
Pt discharged with PTAR to transfer facility, patient education and discharge instructions reviewed with daughter.

## 2020-09-16 DIAGNOSIS — G4089 Other seizures: Secondary | ICD-10-CM | POA: Diagnosis not present

## 2020-09-16 DIAGNOSIS — I619 Nontraumatic intracerebral hemorrhage, unspecified: Secondary | ICD-10-CM | POA: Diagnosis not present

## 2020-09-16 DIAGNOSIS — Z430 Encounter for attention to tracheostomy: Secondary | ICD-10-CM | POA: Diagnosis not present

## 2020-09-16 DIAGNOSIS — I609 Nontraumatic subarachnoid hemorrhage, unspecified: Secondary | ICD-10-CM | POA: Diagnosis not present

## 2020-09-16 DIAGNOSIS — R1312 Dysphagia, oropharyngeal phase: Secondary | ICD-10-CM | POA: Diagnosis not present

## 2020-09-25 DIAGNOSIS — I6930 Unspecified sequelae of cerebral infarction: Secondary | ICD-10-CM | POA: Insufficient documentation

## 2020-09-25 DIAGNOSIS — J9503 Malfunction of tracheostomy stoma: Secondary | ICD-10-CM | POA: Insufficient documentation

## 2020-09-25 DIAGNOSIS — Z430 Encounter for attention to tracheostomy: Secondary | ICD-10-CM | POA: Diagnosis not present

## 2020-09-29 DIAGNOSIS — Z430 Encounter for attention to tracheostomy: Secondary | ICD-10-CM | POA: Diagnosis not present

## 2020-10-05 DIAGNOSIS — Z430 Encounter for attention to tracheostomy: Secondary | ICD-10-CM | POA: Diagnosis not present

## 2020-10-05 DIAGNOSIS — Z931 Gastrostomy status: Secondary | ICD-10-CM | POA: Diagnosis not present

## 2020-10-05 DIAGNOSIS — R1312 Dysphagia, oropharyngeal phase: Secondary | ICD-10-CM | POA: Diagnosis not present

## 2020-10-05 DIAGNOSIS — I609 Nontraumatic subarachnoid hemorrhage, unspecified: Secondary | ICD-10-CM | POA: Diagnosis not present

## 2020-10-05 DIAGNOSIS — R0603 Acute respiratory distress: Secondary | ICD-10-CM | POA: Diagnosis not present

## 2020-10-05 DIAGNOSIS — G4089 Other seizures: Secondary | ICD-10-CM | POA: Diagnosis not present

## 2020-10-08 DIAGNOSIS — R1311 Dysphagia, oral phase: Secondary | ICD-10-CM | POA: Diagnosis not present

## 2020-10-08 DIAGNOSIS — G4089 Other seizures: Secondary | ICD-10-CM | POA: Diagnosis not present

## 2020-10-08 DIAGNOSIS — Z931 Gastrostomy status: Secondary | ICD-10-CM | POA: Diagnosis not present

## 2020-10-08 DIAGNOSIS — R1312 Dysphagia, oropharyngeal phase: Secondary | ICD-10-CM | POA: Diagnosis not present

## 2020-10-08 DIAGNOSIS — Z430 Encounter for attention to tracheostomy: Secondary | ICD-10-CM | POA: Diagnosis not present

## 2020-10-08 DIAGNOSIS — I609 Nontraumatic subarachnoid hemorrhage, unspecified: Secondary | ICD-10-CM | POA: Diagnosis not present

## 2020-10-12 DIAGNOSIS — Z430 Encounter for attention to tracheostomy: Secondary | ICD-10-CM | POA: Diagnosis not present

## 2020-10-12 DIAGNOSIS — G4089 Other seizures: Secondary | ICD-10-CM | POA: Diagnosis not present

## 2020-10-12 DIAGNOSIS — I619 Nontraumatic intracerebral hemorrhage, unspecified: Secondary | ICD-10-CM | POA: Diagnosis not present

## 2020-10-12 DIAGNOSIS — Z931 Gastrostomy status: Secondary | ICD-10-CM | POA: Diagnosis not present

## 2020-10-12 DIAGNOSIS — R0603 Acute respiratory distress: Secondary | ICD-10-CM | POA: Diagnosis not present

## 2020-10-12 DIAGNOSIS — R1312 Dysphagia, oropharyngeal phase: Secondary | ICD-10-CM | POA: Diagnosis not present

## 2020-10-16 DIAGNOSIS — I619 Nontraumatic intracerebral hemorrhage, unspecified: Secondary | ICD-10-CM | POA: Diagnosis not present

## 2020-10-16 DIAGNOSIS — R0603 Acute respiratory distress: Secondary | ICD-10-CM | POA: Diagnosis not present

## 2020-10-16 DIAGNOSIS — G4089 Other seizures: Secondary | ICD-10-CM | POA: Diagnosis not present

## 2020-10-16 DIAGNOSIS — Z430 Encounter for attention to tracheostomy: Secondary | ICD-10-CM | POA: Diagnosis not present

## 2020-10-16 DIAGNOSIS — Z931 Gastrostomy status: Secondary | ICD-10-CM | POA: Diagnosis not present

## 2020-10-16 DIAGNOSIS — R1312 Dysphagia, oropharyngeal phase: Secondary | ICD-10-CM | POA: Diagnosis not present

## 2020-10-26 DIAGNOSIS — I619 Nontraumatic intracerebral hemorrhage, unspecified: Secondary | ICD-10-CM | POA: Diagnosis not present

## 2020-10-26 DIAGNOSIS — G894 Chronic pain syndrome: Secondary | ICD-10-CM | POA: Diagnosis not present

## 2020-11-02 DIAGNOSIS — E1169 Type 2 diabetes mellitus with other specified complication: Secondary | ICD-10-CM | POA: Diagnosis not present

## 2020-11-02 DIAGNOSIS — I10 Essential (primary) hypertension: Secondary | ICD-10-CM | POA: Diagnosis not present

## 2020-11-02 DIAGNOSIS — G4089 Other seizures: Secondary | ICD-10-CM | POA: Diagnosis not present

## 2020-11-02 DIAGNOSIS — I619 Nontraumatic intracerebral hemorrhage, unspecified: Secondary | ICD-10-CM | POA: Diagnosis not present

## 2020-11-02 DIAGNOSIS — R0603 Acute respiratory distress: Secondary | ICD-10-CM | POA: Diagnosis not present

## 2020-11-02 DIAGNOSIS — R1312 Dysphagia, oropharyngeal phase: Secondary | ICD-10-CM | POA: Diagnosis not present

## 2020-11-02 DIAGNOSIS — N184 Chronic kidney disease, stage 4 (severe): Secondary | ICD-10-CM | POA: Diagnosis not present

## 2020-11-02 DIAGNOSIS — Z430 Encounter for attention to tracheostomy: Secondary | ICD-10-CM | POA: Diagnosis not present

## 2020-11-02 DIAGNOSIS — Z931 Gastrostomy status: Secondary | ICD-10-CM | POA: Diagnosis not present

## 2020-11-05 DIAGNOSIS — I619 Nontraumatic intracerebral hemorrhage, unspecified: Secondary | ICD-10-CM | POA: Diagnosis not present

## 2020-11-05 DIAGNOSIS — Z931 Gastrostomy status: Secondary | ICD-10-CM | POA: Diagnosis not present

## 2020-11-05 DIAGNOSIS — G894 Chronic pain syndrome: Secondary | ICD-10-CM | POA: Diagnosis not present

## 2020-11-05 DIAGNOSIS — R1312 Dysphagia, oropharyngeal phase: Secondary | ICD-10-CM | POA: Diagnosis not present

## 2020-11-05 DIAGNOSIS — R0603 Acute respiratory distress: Secondary | ICD-10-CM | POA: Diagnosis not present

## 2020-11-05 DIAGNOSIS — G4089 Other seizures: Secondary | ICD-10-CM | POA: Diagnosis not present

## 2020-11-05 DIAGNOSIS — E1169 Type 2 diabetes mellitus with other specified complication: Secondary | ICD-10-CM | POA: Diagnosis not present

## 2020-11-05 DIAGNOSIS — N184 Chronic kidney disease, stage 4 (severe): Secondary | ICD-10-CM | POA: Diagnosis not present

## 2020-11-05 DIAGNOSIS — I10 Essential (primary) hypertension: Secondary | ICD-10-CM | POA: Diagnosis not present

## 2020-11-11 DIAGNOSIS — N184 Chronic kidney disease, stage 4 (severe): Secondary | ICD-10-CM | POA: Diagnosis not present

## 2020-11-11 DIAGNOSIS — B351 Tinea unguium: Secondary | ICD-10-CM | POA: Diagnosis not present

## 2020-11-11 DIAGNOSIS — E1169 Type 2 diabetes mellitus with other specified complication: Secondary | ICD-10-CM | POA: Diagnosis not present

## 2020-11-12 DIAGNOSIS — I607 Nontraumatic subarachnoid hemorrhage from unspecified intracranial artery: Secondary | ICD-10-CM | POA: Diagnosis not present

## 2020-11-12 DIAGNOSIS — M952 Other acquired deformity of head: Secondary | ICD-10-CM | POA: Diagnosis not present

## 2020-11-12 DIAGNOSIS — R030 Elevated blood-pressure reading, without diagnosis of hypertension: Secondary | ICD-10-CM | POA: Diagnosis not present

## 2020-11-17 ENCOUNTER — Other Ambulatory Visit: Payer: Self-pay | Admitting: Neurosurgery

## 2020-11-18 DIAGNOSIS — J189 Pneumonia, unspecified organism: Secondary | ICD-10-CM | POA: Diagnosis not present

## 2020-11-18 DIAGNOSIS — A419 Sepsis, unspecified organism: Secondary | ICD-10-CM | POA: Diagnosis not present

## 2020-11-18 DIAGNOSIS — R509 Fever, unspecified: Secondary | ICD-10-CM | POA: Diagnosis not present

## 2020-11-18 DIAGNOSIS — Z8679 Personal history of other diseases of the circulatory system: Secondary | ICD-10-CM | POA: Diagnosis not present

## 2020-11-18 DIAGNOSIS — G40909 Epilepsy, unspecified, not intractable, without status epilepticus: Secondary | ICD-10-CM | POA: Diagnosis not present

## 2020-11-18 DIAGNOSIS — L89892 Pressure ulcer of other site, stage 2: Secondary | ICD-10-CM | POA: Diagnosis not present

## 2020-11-18 DIAGNOSIS — R4182 Altered mental status, unspecified: Secondary | ICD-10-CM | POA: Diagnosis not present

## 2020-11-18 DIAGNOSIS — I340 Nonrheumatic mitral (valve) insufficiency: Secondary | ICD-10-CM | POA: Diagnosis not present

## 2020-11-18 DIAGNOSIS — L89894 Pressure ulcer of other site, stage 4: Secondary | ICD-10-CM | POA: Diagnosis not present

## 2020-11-18 DIAGNOSIS — N202 Calculus of kidney with calculus of ureter: Secondary | ICD-10-CM | POA: Diagnosis not present

## 2020-11-18 DIAGNOSIS — I69391 Dysphagia following cerebral infarction: Secondary | ICD-10-CM | POA: Diagnosis not present

## 2020-11-18 DIAGNOSIS — G9341 Metabolic encephalopathy: Secondary | ICD-10-CM | POA: Diagnosis not present

## 2020-11-18 DIAGNOSIS — R5081 Fever presenting with conditions classified elsewhere: Secondary | ICD-10-CM | POA: Diagnosis not present

## 2020-11-18 DIAGNOSIS — I69351 Hemiplegia and hemiparesis following cerebral infarction affecting right dominant side: Secondary | ICD-10-CM | POA: Diagnosis not present

## 2020-11-18 DIAGNOSIS — I63512 Cerebral infarction due to unspecified occlusion or stenosis of left middle cerebral artery: Secondary | ICD-10-CM | POA: Diagnosis not present

## 2020-11-18 DIAGNOSIS — I361 Nonrheumatic tricuspid (valve) insufficiency: Secondary | ICD-10-CM | POA: Diagnosis not present

## 2020-11-18 DIAGNOSIS — G9389 Other specified disorders of brain: Secondary | ICD-10-CM | POA: Diagnosis not present

## 2020-11-18 DIAGNOSIS — J9601 Acute respiratory failure with hypoxia: Secondary | ICD-10-CM | POA: Diagnosis not present

## 2020-11-18 DIAGNOSIS — R1312 Dysphagia, oropharyngeal phase: Secondary | ICD-10-CM | POA: Diagnosis not present

## 2020-11-18 DIAGNOSIS — Z9889 Other specified postprocedural states: Secondary | ICD-10-CM | POA: Diagnosis not present

## 2020-11-18 DIAGNOSIS — E870 Hyperosmolality and hypernatremia: Secondary | ICD-10-CM | POA: Diagnosis not present

## 2020-11-18 DIAGNOSIS — Z20822 Contact with and (suspected) exposure to covid-19: Secondary | ICD-10-CM | POA: Diagnosis not present

## 2020-11-18 DIAGNOSIS — I2699 Other pulmonary embolism without acute cor pulmonale: Secondary | ICD-10-CM | POA: Diagnosis not present

## 2020-11-18 DIAGNOSIS — R29818 Other symptoms and signs involving the nervous system: Secondary | ICD-10-CM | POA: Diagnosis not present

## 2020-11-18 DIAGNOSIS — K769 Liver disease, unspecified: Secondary | ICD-10-CM | POA: Diagnosis not present

## 2020-11-18 DIAGNOSIS — R6520 Severe sepsis without septic shock: Secondary | ICD-10-CM | POA: Diagnosis not present

## 2020-11-18 DIAGNOSIS — E669 Obesity, unspecified: Secondary | ICD-10-CM | POA: Diagnosis not present

## 2020-11-18 DIAGNOSIS — E876 Hypokalemia: Secondary | ICD-10-CM | POA: Diagnosis not present

## 2020-11-18 DIAGNOSIS — R0602 Shortness of breath: Secondary | ICD-10-CM | POA: Diagnosis not present

## 2020-11-18 DIAGNOSIS — R918 Other nonspecific abnormal finding of lung field: Secondary | ICD-10-CM | POA: Diagnosis not present

## 2020-11-18 DIAGNOSIS — Z6838 Body mass index (BMI) 38.0-38.9, adult: Secondary | ICD-10-CM | POA: Diagnosis not present

## 2020-11-18 DIAGNOSIS — J9621 Acute and chronic respiratory failure with hypoxia: Secondary | ICD-10-CM | POA: Diagnosis not present

## 2020-11-19 DIAGNOSIS — G9341 Metabolic encephalopathy: Secondary | ICD-10-CM | POA: Diagnosis not present

## 2020-11-19 DIAGNOSIS — J189 Pneumonia, unspecified organism: Secondary | ICD-10-CM | POA: Diagnosis not present

## 2020-11-19 DIAGNOSIS — E870 Hyperosmolality and hypernatremia: Secondary | ICD-10-CM | POA: Diagnosis not present

## 2020-11-19 DIAGNOSIS — J9601 Acute respiratory failure with hypoxia: Secondary | ICD-10-CM | POA: Diagnosis not present

## 2020-11-19 DIAGNOSIS — A403 Sepsis due to Streptococcus pneumoniae: Secondary | ICD-10-CM | POA: Diagnosis not present

## 2020-11-20 DIAGNOSIS — A4102 Sepsis due to Methicillin resistant Staphylococcus aureus: Secondary | ICD-10-CM | POA: Diagnosis not present

## 2020-11-21 DIAGNOSIS — A4101 Sepsis due to Methicillin susceptible Staphylococcus aureus: Secondary | ICD-10-CM | POA: Diagnosis not present

## 2020-11-21 DIAGNOSIS — R1310 Dysphagia, unspecified: Secondary | ICD-10-CM | POA: Diagnosis not present

## 2020-11-22 DIAGNOSIS — R1310 Dysphagia, unspecified: Secondary | ICD-10-CM | POA: Diagnosis not present

## 2020-11-22 DIAGNOSIS — A4101 Sepsis due to Methicillin susceptible Staphylococcus aureus: Secondary | ICD-10-CM | POA: Diagnosis not present

## 2020-11-23 DIAGNOSIS — J189 Pneumonia, unspecified organism: Secondary | ICD-10-CM | POA: Diagnosis not present

## 2020-11-23 NOTE — Progress Notes (Signed)
Per daughter, Denise Savage, patient has been admitted to [facility] for sepsis.   [name] states that she has left a message at Dr. Val Riles office.  I also notified Nikki at Dr. Val Riles office.

## 2020-11-24 ENCOUNTER — Inpatient Hospital Stay: Admit: 2020-11-24 | Payer: Medicaid Other | Admitting: Neurosurgery

## 2020-11-24 DIAGNOSIS — J189 Pneumonia, unspecified organism: Secondary | ICD-10-CM | POA: Diagnosis not present

## 2020-11-24 SURGERY — CRANIOPLASTY
Anesthesia: General | Laterality: Left

## 2020-11-25 DIAGNOSIS — J189 Pneumonia, unspecified organism: Secondary | ICD-10-CM | POA: Diagnosis not present

## 2020-11-26 DIAGNOSIS — J189 Pneumonia, unspecified organism: Secondary | ICD-10-CM | POA: Diagnosis not present

## 2020-11-27 DIAGNOSIS — J189 Pneumonia, unspecified organism: Secondary | ICD-10-CM | POA: Diagnosis not present

## 2020-11-28 DIAGNOSIS — A4101 Sepsis due to Methicillin susceptible Staphylococcus aureus: Secondary | ICD-10-CM | POA: Diagnosis not present

## 2020-11-29 DIAGNOSIS — A4101 Sepsis due to Methicillin susceptible Staphylococcus aureus: Secondary | ICD-10-CM | POA: Diagnosis not present

## 2020-11-30 DIAGNOSIS — A4101 Sepsis due to Methicillin susceptible Staphylococcus aureus: Secondary | ICD-10-CM | POA: Diagnosis not present

## 2021-01-21 ENCOUNTER — Encounter (HOSPITAL_COMMUNITY): Payer: Self-pay | Admitting: *Deleted

## 2021-01-21 ENCOUNTER — Other Ambulatory Visit: Payer: Self-pay

## 2021-01-21 ENCOUNTER — Emergency Department (HOSPITAL_COMMUNITY): Payer: Medicaid Other

## 2021-01-21 ENCOUNTER — Inpatient Hospital Stay (HOSPITAL_COMMUNITY)
Admission: EM | Admit: 2021-01-21 | Discharge: 2021-02-02 | DRG: 439 | Disposition: A | Discharge status: Skilled Nursing Facility | Payer: Medicaid Other | Source: Skilled Nursing Facility | Attending: Family Medicine | Admitting: Family Medicine

## 2021-01-21 DIAGNOSIS — K853 Drug induced acute pancreatitis without necrosis or infection: Secondary | ICD-10-CM | POA: Diagnosis not present

## 2021-01-21 DIAGNOSIS — E669 Obesity, unspecified: Secondary | ICD-10-CM | POA: Diagnosis present

## 2021-01-21 DIAGNOSIS — Z885 Allergy status to narcotic agent status: Secondary | ICD-10-CM

## 2021-01-21 DIAGNOSIS — D751 Secondary polycythemia: Secondary | ICD-10-CM | POA: Diagnosis present

## 2021-01-21 DIAGNOSIS — R1111 Vomiting without nausea: Secondary | ICD-10-CM | POA: Diagnosis not present

## 2021-01-21 DIAGNOSIS — R4182 Altered mental status, unspecified: Secondary | ICD-10-CM | POA: Diagnosis not present

## 2021-01-21 DIAGNOSIS — E876 Hypokalemia: Secondary | ICD-10-CM | POA: Diagnosis not present

## 2021-01-21 DIAGNOSIS — Z931 Gastrostomy status: Secondary | ICD-10-CM | POA: Diagnosis not present

## 2021-01-21 DIAGNOSIS — I69051 Hemiplegia and hemiparesis following nontraumatic subarachnoid hemorrhage affecting right dominant side: Secondary | ICD-10-CM

## 2021-01-21 DIAGNOSIS — Z7401 Bed confinement status: Secondary | ICD-10-CM | POA: Diagnosis not present

## 2021-01-21 DIAGNOSIS — R1310 Dysphagia, unspecified: Secondary | ICD-10-CM

## 2021-01-21 DIAGNOSIS — Z20822 Contact with and (suspected) exposure to covid-19: Secondary | ICD-10-CM | POA: Diagnosis present

## 2021-01-21 DIAGNOSIS — I608 Other nontraumatic subarachnoid hemorrhage: Secondary | ICD-10-CM | POA: Diagnosis not present

## 2021-01-21 DIAGNOSIS — R0902 Hypoxemia: Secondary | ICD-10-CM | POA: Diagnosis not present

## 2021-01-21 DIAGNOSIS — Z6839 Body mass index (BMI) 39.0-39.9, adult: Secondary | ICD-10-CM

## 2021-01-21 DIAGNOSIS — Z7901 Long term (current) use of anticoagulants: Secondary | ICD-10-CM | POA: Diagnosis not present

## 2021-01-21 DIAGNOSIS — R1110 Vomiting, unspecified: Secondary | ICD-10-CM | POA: Diagnosis not present

## 2021-01-21 DIAGNOSIS — I69091 Dysphagia following nontraumatic subarachnoid hemorrhage: Secondary | ICD-10-CM | POA: Diagnosis not present

## 2021-01-21 DIAGNOSIS — K859 Acute pancreatitis without necrosis or infection, unspecified: Principal | ICD-10-CM | POA: Diagnosis present

## 2021-01-21 DIAGNOSIS — R404 Transient alteration of awareness: Secondary | ICD-10-CM | POA: Diagnosis not present

## 2021-01-21 DIAGNOSIS — E162 Hypoglycemia, unspecified: Secondary | ICD-10-CM | POA: Diagnosis not present

## 2021-01-21 DIAGNOSIS — G40919 Epilepsy, unspecified, intractable, without status epilepticus: Secondary | ICD-10-CM | POA: Diagnosis not present

## 2021-01-21 DIAGNOSIS — R569 Unspecified convulsions: Secondary | ICD-10-CM | POA: Diagnosis not present

## 2021-01-21 DIAGNOSIS — Z86711 Personal history of pulmonary embolism: Secondary | ICD-10-CM

## 2021-01-21 DIAGNOSIS — R9389 Abnormal findings on diagnostic imaging of other specified body structures: Secondary | ICD-10-CM | POA: Diagnosis present

## 2021-01-21 DIAGNOSIS — K2970 Gastritis, unspecified, without bleeding: Secondary | ICD-10-CM | POA: Diagnosis present

## 2021-01-21 DIAGNOSIS — Z79899 Other long term (current) drug therapy: Secondary | ICD-10-CM | POA: Diagnosis not present

## 2021-01-21 DIAGNOSIS — I2699 Other pulmonary embolism without acute cor pulmonale: Secondary | ICD-10-CM | POA: Diagnosis not present

## 2021-01-21 DIAGNOSIS — R9431 Abnormal electrocardiogram [ECG] [EKG]: Secondary | ICD-10-CM | POA: Diagnosis present

## 2021-01-21 DIAGNOSIS — K2980 Duodenitis without bleeding: Secondary | ICD-10-CM | POA: Diagnosis not present

## 2021-01-21 DIAGNOSIS — G459 Transient cerebral ischemic attack, unspecified: Secondary | ICD-10-CM | POA: Diagnosis not present

## 2021-01-21 HISTORY — DX: Other pulmonary embolism without acute cor pulmonale: I26.99

## 2021-01-21 HISTORY — DX: Gastrostomy status: Z93.1

## 2021-01-21 HISTORY — DX: Unspecified convulsions: R56.9

## 2021-01-21 HISTORY — DX: Dysphagia, unspecified: R13.10

## 2021-01-21 HISTORY — DX: Hemiplegia and hemiparesis following cerebral infarction affecting right dominant side: I69.351

## 2021-01-21 LAB — CBC WITH DIFFERENTIAL/PLATELET
Abs Immature Granulocytes: 0.04 10*3/uL (ref 0.00–0.07)
Basophils Absolute: 0 10*3/uL (ref 0.0–0.1)
Basophils Relative: 0 %
Eosinophils Absolute: 0 10*3/uL (ref 0.0–0.5)
Eosinophils Relative: 0 %
HCT: 49.2 % — ABNORMAL HIGH (ref 36.0–46.0)
Hemoglobin: 15.5 g/dL — ABNORMAL HIGH (ref 12.0–15.0)
Immature Granulocytes: 0 %
Lymphocytes Relative: 7 %
Lymphs Abs: 0.7 10*3/uL (ref 0.7–4.0)
MCH: 30 pg (ref 26.0–34.0)
MCHC: 31.5 g/dL (ref 30.0–36.0)
MCV: 95.2 fL (ref 80.0–100.0)
Monocytes Absolute: 1.1 10*3/uL — ABNORMAL HIGH (ref 0.1–1.0)
Monocytes Relative: 11 %
Neutro Abs: 8.3 10*3/uL — ABNORMAL HIGH (ref 1.7–7.7)
Neutrophils Relative %: 82 %
Platelets: 140 10*3/uL — ABNORMAL LOW (ref 150–400)
RBC: 5.17 MIL/uL — ABNORMAL HIGH (ref 3.87–5.11)
RDW: 15.4 % (ref 11.5–15.5)
WBC: 10.2 10*3/uL (ref 4.0–10.5)
nRBC: 0 % (ref 0.0–0.2)

## 2021-01-21 LAB — COMPREHENSIVE METABOLIC PANEL
ALT: 12 U/L (ref 0–44)
AST: 38 U/L (ref 15–41)
Albumin: 2.7 g/dL — ABNORMAL LOW (ref 3.5–5.0)
Alkaline Phosphatase: 63 U/L (ref 38–126)
Anion gap: 6 (ref 5–15)
BUN: 21 mg/dL — ABNORMAL HIGH (ref 6–20)
CO2: 30 mmol/L (ref 22–32)
Calcium: 10.6 mg/dL — ABNORMAL HIGH (ref 8.9–10.3)
Chloride: 101 mmol/L (ref 98–111)
Creatinine, Ser: 0.68 mg/dL (ref 0.44–1.00)
GFR, Estimated: >60 mL/min (ref >60)
Glucose, Bld: 118 mg/dL — ABNORMAL HIGH (ref 70–99)
Potassium: 4 mmol/L (ref 3.5–5.1)
Sodium: 137 mmol/L (ref 135–145)
Total Bilirubin: 0.5 mg/dL (ref 0.3–1.2)
Total Protein: 7 g/dL (ref 6.5–8.1)

## 2021-01-21 LAB — RESP PANEL BY RT-PCR (FLU A&B, COVID) ARPGX2
Influenza A by PCR: NEGATIVE
Influenza B by PCR: NEGATIVE
SARS Coronavirus 2 by RT PCR: NEGATIVE

## 2021-01-21 LAB — LIPASE, BLOOD: Lipase: 643 U/L — ABNORMAL HIGH (ref 11–51)

## 2021-01-21 LAB — VALPROIC ACID LEVEL: Valproic Acid Lvl: 37 ug/mL — ABNORMAL LOW (ref 50.0–100.0)

## 2021-01-21 LAB — MAGNESIUM: Magnesium: 2.2 mg/dL (ref 1.7–2.4)

## 2021-01-21 LAB — CBG MONITORING, ED: Glucose-Capillary: 96 mg/dL (ref 70–99)

## 2021-01-21 LAB — TRIGLYCERIDES: Triglycerides: 91 mg/dL (ref <150)

## 2021-01-21 LAB — TROPONIN I (HIGH SENSITIVITY)
Troponin I (High Sensitivity): 3 ng/L (ref <18)
Troponin I (High Sensitivity): 3 ng/L (ref <18)

## 2021-01-21 LAB — LACTIC ACID, PLASMA: Lactic Acid, Venous: 1.9 mmol/L (ref 0.5–1.9)

## 2021-01-21 MED ORDER — VALPROATE SODIUM 500 MG/5ML IV SOLN
INTRAVENOUS | Status: AC
  Filled 2021-01-21: qty 10

## 2021-01-21 MED ORDER — SODIUM CHLORIDE 0.9 % IV SOLN
Freq: Once | INTRAVENOUS | Status: AC
Start: 2021-01-21 — End: 2021-01-22

## 2021-01-21 MED ORDER — ACETAMINOPHEN 325 MG PO TABS: 650.0000 mg | ORAL_TABLET | Freq: Four times a day (QID) | ORAL | Status: DC | PRN

## 2021-01-21 MED ORDER — SODIUM CHLORIDE 0.9 % IV SOLN: INTRAVENOUS | Status: DC

## 2021-01-21 MED ORDER — ONDANSETRON HCL 4 MG/2ML IJ SOLN
4.0000 mg | Freq: Once | INTRAMUSCULAR | Status: AC
  Administered 2021-01-21: 4 mg via INTRAVENOUS
  Filled 2021-01-21: qty 2

## 2021-01-21 MED ORDER — FENTANYL CITRATE PF 50 MCG/ML IJ SOSY
12.5000 ug | PREFILLED_SYRINGE | INTRAMUSCULAR | Status: DC | PRN
Start: 2021-01-21 — End: 2021-02-02

## 2021-01-21 MED ORDER — VALPROATE SODIUM 100 MG/ML IV SOLN
750.0000 mg | Freq: Three times a day (TID) | INTRAVENOUS | Status: DC
  Administered 2021-01-22 – 2021-01-27 (×19): 750 mg via INTRAVENOUS
  Filled 2021-01-21 (×21): qty 7.5

## 2021-01-21 MED ORDER — APIXABAN 5 MG PO TABS
5.0000 mg | ORAL_TABLET | Freq: Two times a day (BID) | ORAL | Status: DC
  Administered 2021-01-22 – 2021-02-02 (×23): 5 mg
  Filled 2021-01-21 (×23): qty 1

## 2021-01-21 MED ORDER — IOHEXOL 350 MG/ML SOLN
85.0000 mL | Freq: Once | INTRAVENOUS | Status: AC | PRN
  Administered 2021-01-21: 85 mL via INTRAVENOUS

## 2021-01-21 MED ORDER — LEVETIRACETAM IN NACL 1500 MG/100ML IV SOLN
1500.0000 mg | Freq: Two times a day (BID) | INTRAVENOUS | Status: DC
  Administered 2021-01-21 – 2021-01-27 (×11): 1500 mg via INTRAVENOUS
  Filled 2021-01-21 (×11): qty 100

## 2021-01-21 MED ORDER — ACETAMINOPHEN 650 MG RE SUPP: 650.0000 mg | Freq: Four times a day (QID) | RECTAL | Status: DC | PRN

## 2021-01-21 MED ORDER — SODIUM CHLORIDE 0.9 % IV SOLN
50.0000 mg | Freq: Two times a day (BID) | INTRAVENOUS | Status: DC
  Administered 2021-01-21 – 2021-01-27 (×12): 50 mg via INTRAVENOUS
  Filled 2021-01-21 (×14): qty 5

## 2021-01-21 MED ORDER — LACOSAMIDE 200 MG/20ML IV SOLN
INTRAVENOUS | Status: AC
  Filled 2021-01-21: qty 20

## 2021-01-21 MED ORDER — SODIUM CHLORIDE 0.9 % IV BOLUS
3000.0000 mL | Freq: Once | INTRAVENOUS | Status: AC
  Administered 2021-01-21: 3000 mL via INTRAVENOUS

## 2021-01-21 NOTE — ED Triage Notes (Signed)
Pt brought in by [facility] from [facility] with c/o projectile vomiting. Nursing home reports pt has a PEG tube and they stopped her PEG tube feeding at 0700 this morning after her first episode of vomiting. They report despite stopping the PEG tube feeding, pt has continued to vomit. EMS reports pt had a right sided gaze and was not responding at her baseline.

## 2021-01-21 NOTE — ED Provider Notes (Signed)
[facility] EMERGENCY DEPARTMENT Provider Note   CSN: [id number] Arrival date & time: 01/21/21  1519     History No chief complaint on file.   Denise Savage is a 60 y.o. female.  Level 5 caveat secondary to altered mental status.  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  Also was recently admitted for PE.  Currently at [facility] nursing home.  They called EMS after she was vomiting this morning.  She is fed via PEG tube.  EMS was concerned that she had some gaze preference and some right-sided facial droop.  Daughter is here now and says she is at her baseline.  She is sometimes communicative although with garbled speech.  She typically has tremors in her arms and legs.  The history is provided by the EMS personnel and a relative.  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   SAH (subarachnoid hemorrhage) (HCC) 06/15/2020    Past Surgical History:  Procedure Laterality Date   CRANIOTOMY Left 06/19/2020   Procedure: CRANIOTOMY HEMATOMA EVACUATION SUBDURAL;  Surgeon: Jadene Pierini, MD;  Location: [facility] OR;  Service: Neurosurgery;  Laterality: Left;   ESOPHAGOGASTRODUODENOSCOPY N/A 07/01/2020   Procedure: ESOPHAGOGASTRODUODENOSCOPY (EGD);  Surgeon: Diamantina Monks, MD;  Location[facility] ENDOSCOPY;  Service: General;  Laterality: N/A;   IR  ANGIO INTRA EXTRACRAN SEL INTERNAL CAROTID BILAT MOD SED  06/16/2020   IR ANGIO VERTEBRAL SEL VERTEBRAL UNI R MOD SED  06/16/2020   IR ANGIOGRAM FOLLOW UP STUDY  06/16/2020   IR ANGIOGRAM FOLLOW UP STUDY  06/16/2020   IR ANGIOGRAM FOLLOW UP STUDY  06/16/2020   IR CT HEAD LTD  06/16/2020   IR NEURO EACH ADD'L AFTER BASIC UNI RIGHT (MS)  06/16/2020   IR TRANSCATH/EMBOLIZ  06/16/2020   IR US GUIDE VASC ACCESS RIGHT  06/16/2020   PEG PLACEMENT N/A 07/01/2020   Procedure: PERCUTANEOUS ENDOSCOPIC GASTROSTOMY (PEG) PLACEMENT;  Surgeon: Diamantina Monks, MD;  Location: [facility] ENDOSCOPY;  Service: General;  Laterality: N/A;   RADIOLOGY WITH ANESTHESIA N/A 06/16/2020   Procedure: IR WITH ANESTHESIA;  Surgeon: Lisbeth Renshaw, MD;  Location: [facility] OR;  Service: Radiology;  Laterality: N/A;   TUBAL LIGATION     VENTRAL HERNIA REPAIR N/A 12/29/2014   Procedure: LAPAROSCOPIC VENTRAL HERNIA WITH MESH;  Surgeon: Franky Macho, MD;  Location: [facility] ORS;  Service: General;  Laterality: N/A;     OB History     Gravida  3   Para  3   Term  3   Preterm      AB      Living  3      SAB      IAB      Ectopic      Multiple      Live Births  No family history on file.  Social History   Tobacco Use   Smoking status: Never   Smokeless tobacco: Never  Vaping Use   Vaping Use: Never used  Substance Use Topics   Alcohol use: No   Drug use: No    Home Medications Prior to Admission medications   Medication Sig Start Date End Date Taking? Authorizing Provider  lacosamide (VIMPAT) 10 MG/ML oral solution Take 5 mLs (50 mg total) by mouth 2 (two) times daily. Patient not taking: Reported on 11/20/2020 09/08/20   Alyson Ingles, PA-C  levETIRAcetam (KEPPRA) 100 MG/ML solution Place 15 mLs (1,500 mg total) into feeding tube 2 (two) times daily. Patient taking differently: Place 1,500 mg into feeding tube 2 (two) times daily. (0900 & 1700) 09/08/20   Costella, Darci Current, PA-C  valproic acid (DEPAKENE)  250 MG/5ML solution Place 15 mLs (750 mg total) into feeding tube every 8 (eight) hours. Patient taking differently: Place 750 mg into feeding tube every 8 (eight) hours. (0600, 1400 & 2200) 09/08/20   Costella, Darci Current, PA-C    Allergies    Hydrocodone  Review of Systems   Review of Systems  Unable to perform ROS: Patient nonverbal  Gastrointestinal:  Positive for vomiting.   Physical Exam Updated Vital Signs BP 104/74 (BP Location: Right Arm)   Pulse 79   Temp 97.6 F (36.4 C) (Oral)   Resp 17   Ht 5\' 4"  (1.626 m)   Wt 98 kg   SpO2 96%   BMI 37.09 kg/m   Physical Exam Vitals and nursing note reviewed.  Constitutional:      General: She is not in acute distress.    Appearance: Normal appearance. She is well-developed.  HENT:     Head: Atraumatic.     Comments: Cranial defect left skull Eyes:     Conjunctiva/sclera: Conjunctivae normal.  Cardiovascular:     Rate and Rhythm: Normal rate and regular rhythm.     Heart sounds: No murmur heard. Pulmonary:     Effort: Pulmonary effort is normal. No respiratory distress.     Breath sounds: Normal breath sounds.  Abdominal:     Palpations: Abdomen is soft.     Tenderness: There is no abdominal tenderness. There is no guarding or rebound.     Comments: PEG tube in place.  Palpable foreign body left lower quadrant that daughter says is her skull piece  Musculoskeletal:        General: No deformity or signs of injury. Normal range of motion.     Cervical back: No rigidity.  Skin:    General: Skin is warm and dry.  Neurological:     Mental Status: She is alert. Mental status is at baseline.     Comments: Patient is awake.  She will sometimes make eye contact.  Difficult to say if she is able to nod yes or no with questions although appears to nod no for chest pain and abdominal pain.  She is not following commands for arms or legs, has symmetric tremor activity.  Seems to spontaneously move extremities back to position of  comfort when moved.    ED Results / Procedures / Treatments   Labs (all labs ordered are listed, but only abnormal results are displayed) Labs Reviewed  CBC WITH DIFFERENTIAL/PLATELET - Abnormal; Notable for the following components:      Result Value   RBC 5.17 (*)    Hemoglobin 15.5 (*)    HCT 49.2 (*)  Platelets 140 (*)    Neutro Abs 8.3 (*)    Monocytes Absolute 1.1 (*)    All other components within normal limits  COMPREHENSIVE METABOLIC PANEL - Abnormal; Notable for the following components:   Glucose, Bld 118 (*)    BUN 21 (*)    Calcium 10.6 (*)    Albumin 2.7 (*)    All other components within normal limits  LIPASE, BLOOD - Abnormal; Notable for the following components:   Lipase 643 (*)    All other components within normal limits  VALPROIC ACID LEVEL - Abnormal; Notable for the following components:   Valproic Acid Lvl 37 (*)    All other components within normal limits  BASIC METABOLIC PANEL - Abnormal; Notable for the following components:   Glucose, Bld 102 (*)    BUN 21 (*)    All other components within normal limits  HEPATIC FUNCTION PANEL - Abnormal; Notable for the following components:   Total Protein 5.8 (*)    Albumin 2.2 (*)    All other components within normal limits  CBC - Abnormal; Notable for the following components:   WBC 18.6 (*)    HCT 46.3 (*)    RDW 15.7 (*)    Platelets 132 (*)    All other components within normal limits  LIPASE, BLOOD - Abnormal; Notable for the following components:   Lipase 511 (*)    All other components within normal limits  RESP PANEL BY RT-PCR (FLU A&B, COVID) ARPGX2  LACTIC ACID, PLASMA  TRIGLYCERIDES  MAGNESIUM  URINALYSIS, ROUTINE W REFLEX MICROSCOPIC  HIV ANTIBODY (ROUTINE TESTING W REFLEX)  CBG MONITORING, ED  TROPONIN I (HIGH SENSITIVITY)  TROPONIN I (HIGH SENSITIVITY)    EKG EKG Interpretation  Date/Time:  Thursday January 21 2021 15:36:30 EDT Ventricular Rate:  87 PR Interval:  116 QRS  Duration: 56 QT Interval:  527 QTC Calculation: 635 R Axis:   -10 Text Interpretation: Sinus rhythm Borderline short PR interval Borderline T abnormalities, anterior leads Prolonged QT interval Confirmed by Meridee Score [id number]) on 01/21/2021 3:48:08 PM  Radiology CT Head Wo Contrast  Result Date: 01/21/2021 CLINICAL DATA:  Mental status change, unknown cause EXAM: CT HEAD WITHOUT CONTRAST TECHNIQUE: Contiguous axial images were obtained from the base of the skull through the vertex without intravenous contrast. 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 Ex vacuo dilatation of the left lateral ventricle. No acute infarct or hemorrhage. Vascular: Embolization coils seen in the left middle cerebral artery. No hyperdense vessel. Skull: Prior large left craniectomy. Sinuses/Orbits: No acute findings Other: None IMPRESSION: Prior left craniectomy with large area of encephalomalacia throughout much of the left cerebral hemisphere. Ex vacuo dilatation of the left lateral ventricle. No acute intracranial abnormality. Electronically Signed   By: Charlett Nose M.D.   On: 01/21/2021 19:38   CT Abdomen Pelvis W Contrast  Result Date: 01/21/2021 CLINICAL DATA:  Vomiting peg tube EXAM: CT ABDOMEN AND PELVIS WITH CONTRAST TECHNIQUE: Multidetector CT imaging of the abdomen and pelvis was performed using the standard protocol following bolus administration of intravenous contrast. CONTRAST:  66mL OMNIPAQUE IOHEXOL 350 MG/ML SOLN COMPARISON:  None. FINDINGS: Lower chest: Lung bases demonstrate partial lower lobe consolidation which may reflect atelectasis or aspiration. No pleural effusion. Cardiomegaly. Hepatobiliary: Subcentimeter hypodense lesion posterior right hepatic lobe too small to further characterize. No calcified gallstone or biliary dilatation Pancreas: Pancreatic enlargement with marked peripancreatic inflammatory change  and  fluid consistent with acute pancreatitis. No organized fluid collections. Homogeneous enhancement. Spleen: Normal in size without focal abnormality. Adrenals/Urinary Tract: Adrenal glands are normal. Cyst lower pole left kidney. Small bilateral kidney stones. The bladder is thick walled and indistinct. Stomach/Bowel: Stomach nonenlarged. Mild wall thickening and mucosal enhancement of distal stomach and first portion of duodenum. No dilated small bowel. Hyperdense material in the colon. No acute bowel wall thickening. Gastrostomy tube in the distal body of the stomach. Vascular/Lymphatic: Nonaneurysmal aorta with mild atherosclerosis. No suspicious lymph nodes. Portal and splenic veins normally enhance. Reproductive: Prominent size uterus. Endometrial stripe slightly thickened up to 11 mm with focal hypodense area in the posterior lower uterine segment questionable for fibroid. No adnexal mass Other: No free air. Small free fluid in the abdomen and pelvis. 11 by 12 by 9.4 cm suspected foreign body within the subcutaneous fat of the left anterior abdominal wall with small adjacent fluid. This is seen at the level of the umbilicus. Evidence of prior ventral hernia repair with small fat containing recurrent ventral hernia. Musculoskeletal: No acute osseous abnormality IMPRESSION: 1. Findings consistent with acute pancreatitis. No organized fluid collection or evidence for necrosis at this time. Small amount of abdominopelvic ascites 2. Gastrostomy tube present in the distal body of stomach. Mild wall thickening and mucosal enhancement of distal stomach and first portion of duodenum likely reflecting gastritis/duodenitis versus reactive inflammation from adjacent inflamed pancreas 3. 11 x 12 x 9.4 cm suspected foreign body within the subcutaneous fat of the left anterior body wall at the level of the umbilicus. Small amount of fluid surrounding the foreign body. 4. Cardiomegaly. Patchy atelectasis or aspiration  changes at the bases. 5. Thickened endometrial stripe with slightly enlarged appearing uterus for age. Possible hypoechoic mass within the posterior lower uterine segment. Recommend correlation with nonemergent ultrasound. Electronically Signed   By: Jasmine PangKim  Fujinaga M.D.   On: 01/21/2021 19:47   DG Chest Port 1 View  Result Date: 01/21/2021 CLINICAL DATA:  Altered mental status. EXAM: PORTABLE CHEST 1 VIEW COMPARISON:  July 15, 2020. FINDINGS: Stable cardiomediastinal silhouette. Tracheostomy tube has been removed. Both lungs are clear. The visualized skeletal structures are unremarkable. IMPRESSION: No active disease. Electronically Signed   By: Lupita RaiderJames  Green Jr M.D.   On: 01/21/2021 16:27    Procedures Procedures   Medications Ordered in ED Medications  apixaban (ELIQUIS) tablet 5 mg (5 mg Per Tube Given 01/22/21 0844)  acetaminophen (TYLENOL) tablet 650 mg (has no administration in time range)    Or  acetaminophen (TYLENOL) suppository 650 mg (has no administration in time range)  fentaNYL (SUBLIMAZE) injection 12.5-50 mcg (has no administration in time range)  lacosamide (VIMPAT) 50 mg in sodium chloride 0.9 % 25 mL IVPB (50 mg Intravenous New Bag/Given 01/22/21 1034)  levETIRAcetam (KEPPRA) IVPB 1500 mg/ 100 mL premix (1,500 mg Intravenous New Bag/Given 01/22/21 0843)  valproate (DEPACON) 750 mg in dextrose 5 % 50 mL IVPB (750 mg Intravenous New Bag/Given 01/22/21 0540)  dextrose 5 %-0.45 % sodium chloride infusion ( Intravenous New Bag/Given 01/22/21 0836)  iohexol (OMNIPAQUE) 350 MG/ML injection 85 mL (85 mLs Intravenous Contrast Given 01/21/21 1912)  0.9 %  sodium chloride infusion (0 mLs Intravenous Stopped 01/22/21 0836)  ondansetron (ZOFRAN) injection 4 mg (4 mg Intravenous Given 01/21/21 2021)  sodium chloride 0.9 % bolus 3,000 mL (3,000 mLs Intravenous New Bag/Given 01/21/21 2323)    ED Course  I have reviewed the triage vital signs and the nursing notes.  Pertinent  labs & imaging  results that were available during my care of the patient were reviewed by me and considered in my medical decision making (see chart for details).  Clinical Course as of 01/22/21 1032  Thu Jan 21, 2021  1641 Patient's nurse said she had a unresponsive episode.  She could not get her to open her eyes with sternal rub.  She then had an episode of vomiting.  Daughter was there with her and she was not sure if that was a seizure or not.  She seems to be back to baseline at least as far as what I saw on arrival. [MB]  2004 Reviewed the results with the daughter.  She is agreeable to plan for admission to the hospital.  She said the patient is a full code. [MB]    Clinical Course User Index [MB] Terrilee Files, MD   MDM Rules/Calculators/A&P                           This patient complains of vomiting, question new neurologic symptoms; this involves an extensive number of treatment Options and is a complaint that carries with it a high risk of complications and Morbidity. The differential includes gastritis, peptic ulcer disease, pancreatitis, colitis, diverticulitis, obstruction, stroke, bleed, aspiration  I ordered, reviewed and interpreted labs, which included CBC with normal white count normal hemoglobin, chemistries and LFTs fairly unremarkable, lipase elevated, lactate not elevated, Depakote subtherapeutic, troponins flat I ordered medication IV fluids and nausea medication I ordered imaging studies which included chest x-ray, CT head, CT abdomen and pelvis and I independently    visualized and interpreted imaging which showed pancreatic inflammation consistent with pancreatitis. Additional history obtained from patient's daughter and EMS Previous records obtained and reviewed in epic I consulted [facility] hospitalist Dr. Antionette Char and discussed lab and imaging findings  Critical Interventions: None  After the interventions stated above, I reevaluated the patient and found patient to be  hemodynamically stable.  She will he be admitted to the hospital for further management.  Daughter in agreement with plan.  Final Clinical Impression(s) / ED Diagnoses Final diagnoses:  Acute pancreatitis    Rx / DC Orders ED Discharge Orders     None        Terrilee Files, MD 01/22/21 1037

## 2021-01-21 NOTE — H&P (Signed)
History and Physical    Denise Savage:[id number] DOB: January 22, 1961 DOA: 01/21/2021  PCP: Denise Pander, MD   Patient coming from: SNF   Chief Complaint: N/V   HPI: Denise Savage is a 60 y.o. 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  Patient was noted to develop nausea and vomiting this morning at her SNF, tube feeds were stopped, but she continued to have nausea and vomiting was sent to the ED.  She has never had pancreatitis before per report of her family.  Neurologically, her family reports that she has been making improvements since her ruptured aneurysm in January but is often nonverbal and when she does try to speak, cannot be understood.  Family suspects that she was in some pain today as they noted her blood pressure to be higher than normal.  Patient seemed more somnolent than usual today.   ED Course: Upon arrival to the ED, patient is found to be afebrile, saturating well on room air, and with stable blood pressure.  EKG features sinus rhythm with QTc interval of 635 ms.  Chemistry panel notable for elevated BUN to creatinine ratio.  CBC with mild polycythemia and slight thrombocytopenia.  Lactic acid normal.  Lipase 643.  Head CT negative for acute findings.  CT of the abdomen and pelvis concerning for acute pancreatitis without organized fluid collection or necrosis.  Patient was given IV fluids and Zofran in the ED.  Review of Systems:  Unable to complete ROS secondary to the patient's clinical condition.  Past Medical History:  Diagnosis Date   Abdominal hernia    Dysphagia    Gastrostomy status (HCC)    Hemiplegia and hemiparesis following cerebral infarction affecting right dominant side (HCC)    Pulmonary embolus (HCC)     Seizures (HCC)     Past Surgical History:  Procedure Laterality Date   CRANIOTOMY Left 06/19/2020   Procedure: CRANIOTOMY HEMATOMA EVACUATION SUBDURAL;  Surgeon: Jadene Pierini, MD;  Location: [facility] OR;  Service: Neurosurgery;  Laterality: Left;   ESOPHAGOGASTRODUODENOSCOPY N/A 07/01/2020   Procedure: ESOPHAGOGASTRODUODENOSCOPY (EGD);  Surgeon: Diamantina Monks, MD;  Location: [facility] ENDOSCOPY;  Service: General;  Laterality: N/A;   IR ANGIO INTRA EXTRACRAN SEL INTERNAL CAROTID BILAT MOD SED  06/16/2020   IR ANGIO VERTEBRAL SEL VERTEBRAL UNI R MOD SED  06/16/2020   IR ANGIOGRAM FOLLOW UP STUDY  06/16/2020   IR ANGIOGRAM FOLLOW UP STUDY  06/16/2020   IR ANGIOGRAM FOLLOW UP STUDY  06/16/2020   IR CT HEAD LTD  06/16/2020   IR NEURO EACH ADD'L AFTER BASIC UNI RIGHT (MS)  06/16/2020   IR TRANSCATH/EMBOLIZ  06/16/2020   IR US GUIDE VASC ACCESS RIGHT  06/16/2020   PEG PLACEMENT N/A 07/01/2020   Procedure: PERCUTANEOUS ENDOSCOPIC GASTROSTOMY (PEG) PLACEMENT;  Surgeon: Diamantina Monks, MD;  Location: [facility] ENDOSCOPY;  Service: General;  Laterality: N/A;   RADIOLOGY WITH ANESTHESIA N/A 06/16/2020   Procedure: IR WITH ANESTHESIA;  Surgeon: Lisbeth Renshaw, MD;  Location: [facility] OR;  Service: Radiology;  Laterality: N/A;   TUBAL LIGATION     VENTRAL HERNIA REPAIR N/A 12/29/2014   Procedure: LAPAROSCOPIC VENTRAL HERNIA WITH MESH;  Surgeon: Franky Macho, MD;  Location: [facility] ORS;  Service: General;  Laterality: N/A;    Social History:   reports  that she has never smoked. She has never used smokeless tobacco. She reports that she does not drink alcohol and does not use drugs.  Allergies  Allergen Reactions   Hydrocodone Nausea Only    Family History  Problem Relation Age of Onset   Pancreatitis Neg Hx      Prior to Admission medications   Medication Sig Start Date End Date Taking? Authorizing Provider  ELIQUIS 5 MG TABS tablet Take 5 mg by mouth 2 (two) times daily. 01/06/21  Yes [provider]   HYDROcodone-acetaminophen (NORCO) 7.5-325 MG tablet Place 1 tablet into feeding tube every 8 (eight) hours as needed for moderate pain.   Yes [provider]  lacosamide (VIMPAT) 10 MG/ML oral solution Take 5 mLs (50 mg total) by mouth 2 (two) times daily. Patient taking differently: Take 50 mg by mouth 2 (two) times daily. Peg-tube 09/08/20  Yes Costella, Darci Current, PA-C  levETIRAcetam (KEPPRA) 100 MG/ML solution Place 15 mLs (1,500 mg total) into feeding tube 2 (two) times daily. Patient taking differently: Place 1,500 mg into feeding tube 2 (two) times daily. 900 & 2100 09/08/20  Yes Costella, Darci Current, PA-C  losartan (COZAAR) 25 MG tablet Place 25 mg into feeding tube daily. 01/08/21  Yes [provider]  valproic acid (DEPAKENE) 250 MG/5ML solution Place 15 mLs (750 mg total) into feeding tube every 8 (eight) hours. Patient taking differently: Place 750 mg into feeding tube every 6 (six) hours. 09/08/20  Yes Costella, Darci Current, PA-C  levETIRAcetam (KEPPRA) 500 MG/5ML injection     [provider]  tamsulosin (FLOMAX) 0.4 MG CAPS capsule Take 0.4 mg by mouth daily. Patient not taking: No sig reported 12/25/20   [provider]    Physical Exam: Vitals:   01/21/21 1540 01/21/21 1541 01/21/21 1630 01/21/21 1900  BP:  (!) 140/95 114/82 (!) 118/107  Pulse:  84 86 96  Resp: 19 (!) 22 14 (!) 22  Temp:  98.2 F (36.8 C)    TempSrc:  Oral    SpO2:  98% 96% 95%  Weight:      Height:        Constitutional: NAD, calm  Eyes: PERTLA, lids and conjunctivae normal ENMT: Mucous membranes are moist. Posterior pharynx clear of any exudate or lesions.   Neck: supple, no masses  Respiratory: no wheezing, no crackles. No accessory muscle use.  Cardiovascular: S1 & S2 heard, regular rate and rhythm. Trace edema.   Abdomen: No distension, soft, firm subcutaneous mass under surgical incision at LLQ (corresponds to skull bone flap per surgical records). Bowel sounds  active.  Musculoskeletal: Gross skull deformity. No joint deformity upper and lower extremities.   Skin: no significant rashes, lesions, ulcers. Warm, dry, well-perfused. Neurologic: Somnolent. Opens eyes to voice and makes eye contact. Right hemiplegia.     Labs and Imaging on Admission: I have personally reviewed following labs and imaging studies  CBC: Recent Labs  Lab 01/21/21 1724  WBC 10.2  NEUTROABS 8.3*  HGB 15.5*  HCT 49.2*  MCV 95.2  PLT 140*   Basic Metabolic Panel: Recent Labs  Lab 01/21/21 1724  NA 137  K 4.0  CL 101  CO2 30  GLUCOSE 118*  BUN 21*  CREATININE 0.68  CALCIUM 10.6*   GFR: Estimated Creatinine Clearance: 84.9 mL/min (by C-G formula based on SCr of 0.68 mg/dL). Liver Function Tests: Recent Labs  Lab 01/21/21 1724  AST 38  ALT 12  ALKPHOS 63  BILITOT 0.5  PROT 7.0  ALBUMIN 2.7*   Recent Labs  Lab 01/21/21 1724  LIPASE 643*   No results for input(s): AMMONIA in the last 168 hours. Coagulation Profile: No results for input(s): INR, PROTIME in the last 168 hours. Cardiac Enzymes: No results for input(s): CKTOTAL, CKMB, CKMBINDEX, TROPONINI in the last 168 hours. BNP (last 3 results) No results for input(s): PROBNP in the last 8760 hours. HbA1C: No results for input(s): HGBA1C in the last 72 hours. CBG: Recent Labs  Lab 01/21/21 1613  GLUCAP 96   Lipid Profile: No results for input(s): CHOL, HDL, LDLCALC, TRIG, CHOLHDL, LDLDIRECT in the last 72 hours. Thyroid Function Tests: No results for input(s): TSH, T4TOTAL, FREET4, T3FREE, THYROIDAB in the last 72 hours. Anemia Panel: No results for input(s): VITAMINB12, FOLATE, FERRITIN, TIBC, IRON, RETICCTPCT in the last 72 hours. Urine analysis:    Component Value Date/Time   COLORURINE YELLOW 07/16/2020 0418   APPEARANCEUR CLEAR 07/16/2020 0418   LABSPEC 1.026 07/16/2020 0418   PHURINE 5.0 07/16/2020 0418   GLUCOSEU NEGATIVE 07/16/2020 0418   HGBUR NEGATIVE 07/16/2020 0418    BILIRUBINUR NEGATIVE 07/16/2020 0418   KETONESUR NEGATIVE 07/16/2020 0418   PROTEINUR NEGATIVE 07/16/2020 0418   NITRITE NEGATIVE 07/16/2020 0418   LEUKOCYTESUR TRACE (A) 07/16/2020 0418   Sepsis Labs: @LABRCNTIP (procalcitonin:4,lacticidven:4) ) Recent Results (from the past 240 hour(s))  Resp Panel by RT-PCR (Flu A&B, Covid) Nasopharyngeal Swab     Status: None   Collection Time: 01/21/21  6:34 PM   Specimen: Nasopharyngeal Swab; Nasopharyngeal(NP) swabs in vial transport medium  Result Value Ref Range Status   SARS Coronavirus 2 by RT PCR NEGATIVE NEGATIVE Final    Comment: (NOTE) SARS-CoV-2 target nucleic acids are NOT DETECTED.  The SARS-CoV-2 RNA is generally detectable in upper respiratory specimens during the acute phase of infection. The lowest concentration of SARS-CoV-2 viral copies this assay can detect is 138 copies/mL. A negative result does not preclude SARS-Cov-2 infection and should not be used as the sole basis for treatment or other patient management decisions. A negative result may occur with  improper specimen collection/handling, submission of specimen other than nasopharyngeal swab, presence of viral mutation(s) within the areas targeted by this assay, and inadequate number of viral copies(<138 copies/mL). A negative result must be combined with clinical observations, patient history, and epidemiological information. The expected result is Negative.  Fact Sheet for Patients:  03/23/21  Fact Sheet for Healthcare Providers:  BloggerCourse.com  This test is no t yet approved or cleared by the [employer] FDA and  has been authorized for detection and/or diagnosis of SARS-CoV-2 by FDA under an Emergency Use Authorization (EUA). This EUA will remain  in effect (meaning this test can be used) for the duration of the COVID-19 declaration under Section 564(b)(1) of the Act, 21 U.S.C.section  360bbb-3(b)(1), unless the authorization is terminated  or revoked sooner.       Influenza A by PCR NEGATIVE NEGATIVE Final   Influenza B by PCR NEGATIVE NEGATIVE Final    Comment: (NOTE) The Xpert Xpress SARS-CoV-2/FLU/RSV plus assay is intended as an aid in the diagnosis of influenza from Nasopharyngeal swab specimens and should not be used as a sole basis for treatment. Nasal washings and aspirates are unacceptable for Xpert Xpress SARS-CoV-2/FLU/RSV testing.  Fact Sheet for Patients: [place]  Fact Sheet for Healthcare Providers: BloggerCourse.com  This test is not yet approved or cleared by the [employer] FDA and has been authorized for detection and/or diagnosis of SARS-CoV-2  by FDA under an Emergency Use Authorization (EUA). This EUA will remain in effect (meaning this test can be used) for the duration of the COVID-19 declaration under Section 564(b)(1) of the Act, 21 U.S.C. section 360bbb-3(b)(1), unless the authorization is terminated or revoked.  Performed at [facility], [address] [place], [place] [id number]      Radiological Exams on Admission: CT Head Wo Contrast  Result Date: 01/21/2021 CLINICAL DATA:  Mental status change, unknown cause EXAM: CT HEAD WITHOUT CONTRAST TECHNIQUE: Contiguous axial images were obtained from the base of the skull through the vertex without intravenous contrast. 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 Ex vacuo dilatation of the left lateral ventricle. No acute infarct or hemorrhage. Vascular: Embolization coils seen in the left middle cerebral artery. No hyperdense vessel. Skull: Prior large left craniectomy. Sinuses/Orbits: No acute findings Other: None IMPRESSION: Prior left craniectomy with large area of encephalomalacia throughout much of the left cerebral hemisphere. Ex vacuo  dilatation of the left lateral ventricle. No acute intracranial abnormality. Electronically Signed   By: Charlett NoseKevin  Dover M.D.   On: 01/21/2021 19:38   CT Abdomen Pelvis W Contrast  Result Date: 01/21/2021 CLINICAL DATA:  Vomiting peg tube EXAM: CT ABDOMEN AND PELVIS WITH CONTRAST TECHNIQUE: Multidetector CT imaging of the abdomen and pelvis was performed using the standard protocol following bolus administration of intravenous contrast. CONTRAST:  85mL OMNIPAQUE IOHEXOL 350 MG/ML SOLN COMPARISON:  None. FINDINGS: Lower chest: Lung bases demonstrate partial lower lobe consolidation which may reflect atelectasis or aspiration. No pleural effusion. Cardiomegaly. Hepatobiliary: Subcentimeter hypodense lesion posterior right hepatic lobe too small to further characterize. No calcified gallstone or biliary dilatation Pancreas: Pancreatic enlargement with marked peripancreatic inflammatory change and fluid consistent with acute pancreatitis. No organized fluid collections. Homogeneous enhancement. Spleen: Normal in size without focal abnormality. Adrenals/Urinary Tract: Adrenal glands are normal. Cyst lower pole left kidney. Small bilateral kidney stones. The bladder is thick walled and indistinct. Stomach/Bowel: Stomach nonenlarged. Mild wall thickening and mucosal enhancement of distal stomach and first portion of duodenum. No dilated small bowel. Hyperdense material in the colon. No acute bowel wall thickening. Gastrostomy tube in the distal body of the stomach. Vascular/Lymphatic: Nonaneurysmal aorta with mild atherosclerosis. No suspicious lymph nodes. Portal and splenic veins normally enhance. Reproductive: Prominent size uterus. Endometrial stripe slightly thickened up to 11 mm with focal hypodense area in the posterior lower uterine segment questionable for fibroid. No adnexal mass Other: No free air. Small free fluid in the abdomen and pelvis. 11 by 12 by 9.4 cm suspected foreign body within the subcutaneous fat  of the left anterior abdominal wall with small adjacent fluid. This is seen at the level of the umbilicus. Evidence of prior ventral hernia repair with small fat containing recurrent ventral hernia. Musculoskeletal: No acute osseous abnormality IMPRESSION: 1. Findings consistent with acute pancreatitis. No organized fluid collection or evidence for necrosis at this time. Small amount of abdominopelvic ascites 2. Gastrostomy tube present in the distal body of stomach. Mild wall thickening and mucosal enhancement of distal stomach and first portion of duodenum likely reflecting gastritis/duodenitis versus reactive inflammation from adjacent inflamed pancreas 3. 11 x 12 x 9.4 cm suspected foreign body within the subcutaneous fat of the left anterior body wall at the level of the umbilicus. Small amount of fluid surrounding the foreign body. 4. Cardiomegaly. Patchy atelectasis or aspiration changes at the bases. 5. Thickened endometrial stripe with slightly enlarged  appearing uterus for age. Possible hypoechoic mass within the posterior lower uterine segment. Recommend correlation with nonemergent ultrasound. Electronically Signed   By: Jasmine Pang M.D.   On: 01/21/2021 19:47   DG Chest Port 1 View  Result Date: 01/21/2021 CLINICAL DATA:  Altered mental status. EXAM: PORTABLE CHEST 1 VIEW COMPARISON:  July 15, 2020. FINDINGS: Stable cardiomediastinal silhouette. Tracheostomy tube has been removed. Both lungs are clear. The visualized skeletal structures are unremarkable. IMPRESSION: No active disease. Electronically Signed   By: Lupita Raider M.D.   On: 01/21/2021 16:27    EKG: Independently reviewed. Sinus rhythm, QTc 635 ms.   Assessment/Plan   1. Acute pancreatitis  - Presents with one day of N/V and is found to have lipase of 643 and CT findings consistent with acute pancreatitis  - LFTs normal, no biliary dilation on CT  - Check triglycerides and RUQ [place], continue to hold tube feeds, start fluid  resuscitation, pain-control, supportive care    2. Prolonged QT interval  - QTc is 635 ms in ED  - Check magnesium level, minimize QT-prolonging medications, repeat EKG    3. PE   - Admitted 11/18/20 with small RLL PE without heart strain and was discharged on Eliquis  - Continue Eliquis    4. Seizures  - Give antiepileptics IV for now    5. History of SAH; dysphagia; hemiparesis  - Pt suffered ruptured intracranial aneurysm in January 2022  - She has ongoing dysphagia with PEG dependence, right hemiparesis, only occasionally tried to speak and is unintelligible  - Hold tube feeds for now, continue supportive care    6. Endometrial thickening  - Noted incidentally on CT, non-emergent [place] recommended    DVT prophylaxis: Eliquis  Code Status: Full  Level of Care: Level of care: Telemetry Family Communication: Both daughters updated by phone  Disposition Plan:  Patient is from: SNF  Anticipated d/c is to: SNF  Anticipated d/c date is: 01/24/21  Patient currently: pending improvement in pancreatitis, tolerance of tube feeds  Consults called: none  Admission status: Inpatient     Briscoe Deutscher, MD [facility]  01/21/2021, 9:02 PM

## 2021-01-21 NOTE — ED Notes (Signed)
IV access attempted x2. Pt is a hard stick. Will require ultrasound to place.

## 2021-01-22 ENCOUNTER — Inpatient Hospital Stay (HOSPITAL_COMMUNITY): Payer: Medicaid Other

## 2021-01-22 DIAGNOSIS — I608 Other nontraumatic subarachnoid hemorrhage: Secondary | ICD-10-CM | POA: Diagnosis not present

## 2021-01-22 DIAGNOSIS — R1310 Dysphagia, unspecified: Secondary | ICD-10-CM

## 2021-01-22 DIAGNOSIS — R9431 Abnormal electrocardiogram [ECG] [EKG]: Secondary | ICD-10-CM | POA: Diagnosis not present

## 2021-01-22 DIAGNOSIS — K859 Acute pancreatitis without necrosis or infection, unspecified: Secondary | ICD-10-CM | POA: Diagnosis not present

## 2021-01-22 DIAGNOSIS — K853 Drug induced acute pancreatitis without necrosis or infection: Secondary | ICD-10-CM | POA: Diagnosis not present

## 2021-01-22 DIAGNOSIS — R9389 Abnormal findings on diagnostic imaging of other specified body structures: Secondary | ICD-10-CM | POA: Diagnosis not present

## 2021-01-22 DIAGNOSIS — R569 Unspecified convulsions: Secondary | ICD-10-CM

## 2021-01-22 DIAGNOSIS — I2699 Other pulmonary embolism without acute cor pulmonale: Secondary | ICD-10-CM

## 2021-01-22 DIAGNOSIS — R188 Other ascites: Secondary | ICD-10-CM | POA: Diagnosis not present

## 2021-01-22 LAB — HEPATIC FUNCTION PANEL
ALT: 9 U/L (ref 0–44)
AST: 28 U/L (ref 15–41)
Albumin: 2.2 g/dL — ABNORMAL LOW (ref 3.5–5.0)
Alkaline Phosphatase: 50 U/L (ref 38–126)
Bilirubin, Direct: 0.2 mg/dL (ref 0.0–0.2)
Indirect Bilirubin: 0.3 mg/dL (ref 0.3–0.9)
Total Bilirubin: 0.5 mg/dL (ref 0.3–1.2)
Total Protein: 5.8 g/dL — ABNORMAL LOW (ref 6.5–8.1)

## 2021-01-22 LAB — CBC
HCT: 46.3 % — ABNORMAL HIGH (ref 36.0–46.0)
Hemoglobin: 14.2 g/dL (ref 12.0–15.0)
MCH: 29.6 pg (ref 26.0–34.0)
MCHC: 30.7 g/dL (ref 30.0–36.0)
MCV: 96.7 fL (ref 80.0–100.0)
Platelets: 132 10*3/uL — ABNORMAL LOW (ref 150–400)
RBC: 4.79 MIL/uL (ref 3.87–5.11)
RDW: 15.7 % — ABNORMAL HIGH (ref 11.5–15.5)
WBC: 18.6 10*3/uL — ABNORMAL HIGH (ref 4.0–10.5)
nRBC: 0 % (ref 0.0–0.2)

## 2021-01-22 LAB — BASIC METABOLIC PANEL WITH GFR
Anion gap: 7 (ref 5–15)
BUN: 21 mg/dL — ABNORMAL HIGH (ref 6–20)
CO2: 26 mmol/L (ref 22–32)
Calcium: 9.6 mg/dL (ref 8.9–10.3)
Chloride: 108 mmol/L (ref 98–111)
Creatinine, Ser: 0.7 mg/dL (ref 0.44–1.00)
GFR, Estimated: >60 mL/min
Glucose, Bld: 102 mg/dL — ABNORMAL HIGH (ref 70–99)
Potassium: 4.9 mmol/L (ref 3.5–5.1)
Sodium: 141 mmol/L (ref 135–145)

## 2021-01-22 LAB — HIV ANTIBODY (ROUTINE TESTING W REFLEX): HIV Screen 4th Generation wRfx: NONREACTIVE

## 2021-01-22 LAB — LIPASE, BLOOD: Lipase: 511 U/L — ABNORMAL HIGH (ref 11–51)

## 2021-01-22 MED ORDER — LACOSAMIDE 200 MG/20ML IV SOLN
INTRAVENOUS | Status: AC
  Filled 2021-01-22: qty 20

## 2021-01-22 MED ORDER — DEXTROSE-NACL 5-0.45 % IV SOLN: INTRAVENOUS | Status: DC

## 2021-01-22 NOTE — Progress Notes (Signed)
PROGRESS NOTE    Patient: Denise Savage                            PCP: Charlynne Pander, MD                    DOB: [birth year]            DOA: 01/21/2021 RXY:[id number]             DOS: 01/22/2021, 2:18 PM   LOS: 1 day   Date of Service: The patient was seen and examined on 01/22/2021  Subjective:   The patient was seen and examined this morning. Hemodynamically stable, At baseline nonverbal personal and ambulating Seen and examined in bed comfortable   Brief Narrative:   KHALIL BELOTE is a 60 y.o. 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  Patient was noted to develop nausea and vomiting this morning at her SNF, tube feeds were stopped, but she continued to have nausea and vomiting was sent to the ED.  She has never had pancreatitis before per report of her family.  Neurologically, her family reports that she has been making improvements since her ruptured aneurysm in January but is often nonverbal and when she does try to speak, cannot be understood.  Family suspects that she was in some pain today as they noted her blood pressure to be higher than normal.  Patient seemed more somnolent than usual today.    ED Course: Upon arrival to the ED, patient is found to be afebrile, saturating well on room air, and with stable blood pressure.  EKG features sinus rhythm with QTc interval of 635 ms.  Chemistry panel notable for elevated BUN to creatinine ratio.  CBC with mild polycythemia and slight thrombocytopenia.  Lactic acid normal.  Lipase 643.  Head CT negative for acute findings.  CT of the abdomen and pelvis concerning for acute pancreatitis without organized fluid collection or necrosis.  Patient was given IV fluids and Zofran in the  ED.    Assessment & Plan:   Principal Problem:   Acute pancreatitis Active Problems:   Aneurysmal subarachnoid hemorrhage (HCC)   Seizures (HCC)   Pulmonary embolus (HCC)   Dysphagia   Prolonged QT interval   Thickened endometrium    1. Acute pancreatitis  -No acute distress, not complaining of abdominal pain -Improved nausea vomiting... Continue as needed IV antiemetics - Lipase of 643 >>> 511  - CT findings consistent with acute pancreatitis  - LFTs normal, no biliary dilation on CT  - Triglycerides 91  -  RUQ [place] - 1. No findings of gallstones or biliary dilatation. - continue to hold tube feeds, start fluid resuscitation, pain-control, supportive care   - LA 1.9   2. Prolonged QT interval  - QTc is 635 ms in ED  - Check magnesium level, minimize QT-prolonging medications, repeat EKG     3. PE   - Admitted 11/18/20 with small RLL PE without heart strain and was discharged on Eliquis  - Continue Eliquis   - Stable    4. Seizures  - Give antiepileptics IV for now    5. History of SAH; dysphagia; hemiparesis  -Nonverbal, bedbound   - Pt suffered ruptured intracranial  aneurysm in January 2022  - She has ongoing dysphagia with PEG dependence, right hemiparesis, only occasionally tried to speak and is unintelligible  - Hold tube feeds for now, continue supportive care  - stable    6. Endometrial thickening  - Noted incidentally on CT, non-emergent US recommended      ----------------------------------------------------------------------------------------------------------------------------------------------- Nutritional status:  The patient's BMI is: Body mass index is 37.09 kg/m. I agree with the assessment and plan as outlined below: Nutrition Status: Nutrition Problem: Inadequate oral intake Etiology: inability to eat Signs/Symptoms: NPO status Interventions: Tube feeding,  Prostat ------------------------------------------------------------------------------------------------------------------------------------------------ Cultures / Antimicrobials: none    Consultants: None   ------------------------------------------------------------------------------------------------------------------------------------------------  DVT prophylaxis:  SCD/Compression stockings and NW:GNFAOZHOn:Eliquis Code Status:   Code Status: Full Code  Family Communication: Discussed with her son at bedside The above findings and plan of care has been discussed with patient (and family)  in detail,  they expressed understanding and agreement of above. -Advance care planning has been discussed.   Admission status:   Status is: Inpatient  Remains inpatient appropriate because:IV treatments appropriate due to intensity of illness or inability to take PO and Inpatient level of care appropriate due to severity of illness  Dispo: The patient is from: SNF              Anticipated d/c is to: SNF 1-2 days               Patient currently is not medically stable to d/c.   Difficult to place patient No    Level of care: Telemetry   Procedures:   No admission procedures for hospital encounter.    Antimicrobials:  None    Medication:   apixaban  5 mg Per Tube BID    acetaminophen **OR** acetaminophen, fentaNYL (SUBLIMAZE) injection   Objective:   Vitals:   01/22/21 0318 01/22/21 0500 01/22/21 0518 01/22/21 1300  BP: 129/85  104/74 110/76  Pulse: 78  79 88  Resp: 17  17 16   Temp: (!) 97.5 F (36.4 C)  97.6 F (36.4 C) (!) 97 F (36.1 C)  TempSrc: Oral  Oral Oral  SpO2: (!) 85%  96% 94%  Weight:  98 kg    Height:        Intake/Output Summary (Last 24 hours) at 01/22/2021 1418 Last data filed at 01/22/2021 1300 Gross per 24 hour  Intake 452.7 ml  Output --  Net 452.7 ml   Filed Weights   01/21/21 1534 01/22/21 0500  Weight: 97.6 kg 98 kg     Examination:    Physical Exam  Constitution: Lethargic- somnolent -nonverbal... Bedbound HEENT: Normocephalic, PERRL, otherwise with in Normal limits  Chest:Chest symmetric Cardio vascular:  S1/S2, RRR, No murmure, No Rubs or Gallops  pulmonary: Clear to auscultation bilaterally, respirations unlabored, negative wheezes / crackles Abdomen: Soft, non-tender, non-distended, bowel sounds,no masses, no organomegaly Muscular skeletal: generalized weaknesses  Limited exam - in bed,  Neuro: Limited exam -responding to pain stimuli Extremities: No pitting edema lower extremities, +2 pulses  Skin: Dry, warm to touch, negative for any Rashes, No open wounds Wounds: per nursing documentation    ------------------------------------------------------------------------------------------------------------------------------------------    LABs:  CBC Latest Ref Rng & Units 01/22/2021 01/21/2021 09/10/2020  WBC 4.0 - 10.5 K/uL 18.6(H) 10.2 5.7  Hemoglobin 12.0 - 15.0 g/dL 08.614.2 15.5(H) 12.3  Hematocrit 36.0 - 46.0 % 46.3(H) 49.2(H) 38.6  Platelets 150 - 400 K/uL 132(L) 140(L) 240   CMP Latest Ref Rng & Units 01/22/2021 01/21/2021 08/15/2020  Glucose 70 - 99 mg/dL 349(Z) 791(T) 056(P)  BUN 6 - 20 mg/dL 79(Y) 80(X) 65(V)  Creatinine 0.44 - 1.00 mg/dL 3.74 8.27 0.78  Sodium 135 - 145 mmol/L 141 137 133(L)  Potassium 3.5 - 5.1 mmol/L 4.9 4.0 4.7  Chloride 98 - 111 mmol/L 108 101 100  CO2 22 - 32 mmol/L 26 30 25   Calcium 8.9 - 10.3 mg/dL 9.6 10.6(H) 10.9(H)  Total Protein 6.5 - 8.1 g/dL 6.7(J) 7.0 -  Total Bilirubin 0.3 - 1.2 mg/dL 0.5 0.5 -  Alkaline Phos 38 - 126 U/L 50 63 -  AST 15 - 41 U/L 28 38 -  ALT 0 - 44 U/L 9 12 -       Micro Results Recent Results (from the past 240 hour(s))  Resp Panel by RT-PCR (Flu A&B, Covid) Nasopharyngeal Swab     Status: None   Collection Time: 01/21/21  6:34 PM   Specimen: Nasopharyngeal Swab; Nasopharyngeal(NP) swabs in vial transport medium  Result Value Ref Range Status    SARS Coronavirus 2 by RT PCR NEGATIVE NEGATIVE Final    Comment: (NOTE) SARS-CoV-2 target nucleic acids are NOT DETECTED.  The SARS-CoV-2 RNA is generally detectable in upper respiratory specimens during the acute phase of infection. The lowest concentration of SARS-CoV-2 viral copies this assay can detect is 138 copies/mL. A negative result does not preclude SARS-Cov-2 infection and should not be used as the sole basis for treatment or other patient management decisions. A negative result may occur with  improper specimen collection/handling, submission of specimen other than nasopharyngeal swab, presence of viral mutation(s) within the areas targeted by this assay, and inadequate number of viral copies(<138 copies/mL). A negative result must be combined with clinical observations, patient history, and epidemiological information. The expected result is Negative.  Fact Sheet for Patients:  BloggerCourse.com  Fact Sheet for Healthcare Providers:  SeriousBroker.it  This test is no t yet approved or cleared by the [place] FDA and  has been authorized for detection and/or diagnosis of SARS-CoV-2 by FDA under an Emergency Use Authorization (EUA). This EUA will remain  in effect (meaning this test can be used) for the duration of the COVID-19 declaration under Section 564(b)(1) of the Act, 21 U.S.C.section 360bbb-3(b)(1), unless the authorization is terminated  or revoked sooner.       Influenza A by PCR NEGATIVE NEGATIVE Final   Influenza B by PCR NEGATIVE NEGATIVE Final    Comment: (NOTE) The Xpert Xpress SARS-CoV-2/FLU/RSV plus assay is intended as an aid in the diagnosis of influenza from Nasopharyngeal swab specimens and should not be used as a sole basis for treatment. Nasal washings and aspirates are unacceptable for Xpert Xpress SARS-CoV-2/FLU/RSV testing.  Fact Sheet for  Patients: BloggerCourse.com  Fact Sheet for Healthcare Providers: SeriousBroker.it  This test is not yet approved or cleared by the [place] FDA and has been authorized for detection and/or diagnosis of SARS-CoV-2 by FDA under an Emergency Use Authorization (EUA). This EUA will remain in effect (meaning this test can be used) for the duration of the COVID-19 declaration under Section 564(b)(1) of the Act, 21 U.S.C. section 360bbb-3(b)(1), unless the authorization is terminated or revoked.  Performed at [facility], [address], [place], [place] [address]     Radiology Reports CT Head Wo Contrast  Result Date: 01/21/2021 CLINICAL DATA:  Mental status change, unknown cause EXAM: CT HEAD WITHOUT CONTRAST TECHNIQUE: Contiguous axial images were obtained from the base of the skull through the vertex  without intravenous contrast. 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 Ex vacuo dilatation of the left lateral ventricle. No acute infarct or hemorrhage. Vascular: Embolization coils seen in the left middle cerebral artery. No hyperdense vessel. Skull: Prior large left craniectomy. Sinuses/Orbits: No acute findings Other: None IMPRESSION: Prior left craniectomy with large area of encephalomalacia throughout much of the left cerebral hemisphere. Ex vacuo dilatation of the left lateral ventricle. No acute intracranial abnormality. Electronically Signed   By: Charlett Nose M.D.   On: 01/21/2021 19:38   CT Abdomen Pelvis W Contrast  Result Date: 01/21/2021 CLINICAL DATA:  Vomiting peg tube EXAM: CT ABDOMEN AND PELVIS WITH CONTRAST TECHNIQUE: Multidetector CT imaging of the abdomen and pelvis was performed using the standard protocol following bolus administration of intravenous contrast. CONTRAST:  12mL OMNIPAQUE IOHEXOL 350 MG/ML SOLN COMPARISON:  None. FINDINGS:  Lower chest: Lung bases demonstrate partial lower lobe consolidation which may reflect atelectasis or aspiration. No pleural effusion. Cardiomegaly. Hepatobiliary: Subcentimeter hypodense lesion posterior right hepatic lobe too small to further characterize. No calcified gallstone or biliary dilatation Pancreas: Pancreatic enlargement with marked peripancreatic inflammatory change and fluid consistent with acute pancreatitis. No organized fluid collections. Homogeneous enhancement. Spleen: Normal in size without focal abnormality. Adrenals/Urinary Tract: Adrenal glands are normal. Cyst lower pole left kidney. Small bilateral kidney stones. The bladder is thick walled and indistinct. Stomach/Bowel: Stomach nonenlarged. Mild wall thickening and mucosal enhancement of distal stomach and first portion of duodenum. No dilated small bowel. Hyperdense material in the colon. No acute bowel wall thickening. Gastrostomy tube in the distal body of the stomach. Vascular/Lymphatic: Nonaneurysmal aorta with mild atherosclerosis. No suspicious lymph nodes. Portal and splenic veins normally enhance. Reproductive: Prominent size uterus. Endometrial stripe slightly thickened up to 11 mm with focal hypodense area in the posterior lower uterine segment questionable for fibroid. No adnexal mass Other: No free air. Small free fluid in the abdomen and pelvis. 11 by 12 by 9.4 cm suspected foreign body within the subcutaneous fat of the left anterior abdominal wall with small adjacent fluid. This is seen at the level of the umbilicus. Evidence of prior ventral hernia repair with small fat containing recurrent ventral hernia. Musculoskeletal: No acute osseous abnormality IMPRESSION: 1. Findings consistent with acute pancreatitis. No organized fluid collection or evidence for necrosis at this time. Small amount of abdominopelvic ascites 2. Gastrostomy tube present in the distal body of stomach. Mild wall thickening and mucosal enhancement of  distal stomach and first portion of duodenum likely reflecting gastritis/duodenitis versus reactive inflammation from adjacent inflamed pancreas 3. 11 x 12 x 9.4 cm suspected foreign body within the subcutaneous fat of the left anterior body wall at the level of the umbilicus. Small amount of fluid surrounding the foreign body. 4. Cardiomegaly. Patchy atelectasis or aspiration changes at the bases. 5. Thickened endometrial stripe with slightly enlarged appearing uterus for age. Possible hypoechoic mass within the posterior lower uterine segment. Recommend correlation with nonemergent ultrasound. Electronically Signed   By: Jasmine Pang M.D.   On: 01/21/2021 19:47   DG Chest Port 1 View  Result Date: 01/21/2021 CLINICAL DATA:  Altered mental status. EXAM: PORTABLE CHEST 1 VIEW COMPARISON:  July 15, 2020. FINDINGS: Stable cardiomediastinal silhouette. Tracheostomy tube has been removed. Both lungs are clear. The visualized skeletal structures are unremarkable. IMPRESSION: No active disease. Electronically Signed   By: Lupita Raider M.D.   On: 01/21/2021 16:27   US Abdomen  Limited RUQ (LIVER/GB)  Result Date: 01/22/2021 CLINICAL DATA:  Acute pancreatitis EXAM: ULTRASOUND ABDOMEN LIMITED RIGHT UPPER QUADRANT COMPARISON:  CT abdomen 01/21/2021 FINDINGS: Gallbladder: Gallbladder wall is within normal limits at 0.2 cm. No gallstones or sonographic Murphy sign identified. Common bile duct: Diameter: 0.3 cm Liver: Mildly coarsened echotexture, without substantial focal lesion identified. Portal vein is patent on color Doppler imaging with normal direction of blood flow towards the liver. Other: Trace perihepatic ascites. IMPRESSION: 1. No findings of gallstones or biliary dilatation. 2. Trace perihepatic ascites. 3. Subtle coarsening of the echotexture of the liver is nonspecific but can be seen in a variety of conditions including hepatitis, metabolic abnormalities, cirrhosis, and hemochromatosis.  Electronically Signed   By: Gaylyn Rong M.D.   On: 01/22/2021 10:46    SIGNED: Kendell Bane, MD, FHM. [facility],  Pager (please use [email or web address] to page/text) Please use Epic Secure Chat for non-urgent communication (7AM-7PM)  If 7PM-7AM, please contact night-coverage [email or web address], 01/22/2021, 2:18 PM

## 2021-01-22 NOTE — Progress Notes (Signed)
Initial Nutrition Assessment  DOCUMENTATION CODES:  Obesity unspecified  INTERVENTION:  Once medically able, initiate tube feeding via PEG: Osmolite 1.5 at 20 ml/h and increase by 10 ml every 8 hrs to goal rate of 55 ml/h (1320 ml per day) Prosource TF 45 ml BID  Provides 2060 kcal, 105 gm protein, 1003 ml free water daily.  Free water flushes 150 ml q 4 hrs.  NUTRITION DIAGNOSIS:  Inadequate oral intake related to inability to eat as evidenced by NPO status.  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  Patient was noted to develop nausea and vomiting this morning at her SNF, tube feeds were stopped, but she continued to have nausea and vomiting was sent to the ED.  She has never had pancreatitis before per report of her family.  Neurologically, her family reports that she has been making improvements since her ruptured aneurysm in January but is often nonverbal and when she does try to speak, cannot be understood.  Family suspects that she was in some pain today as they noted her blood pressure to be higher than normal.  Patient seemed more somnolent than usual today.  Pt asleep during RD visit. Spoke with son in the room, son unaware of patient's usual tube feeding regimen at this time.  Per Care Everywhere, pt was hospitalized from 11/18/20-12/01/20 with sepsis.   She was discharged on TF regimen of Osmolite 1.5 @ 55 ml/hr and 250 ml FWF q 4 hrs.  As of 11/29/20, pt weighed 97.5 kg, indicating her weight has been stable the past two months. Before that, she weighed 97.6 kg on 09/10/20,  indicating stable weight over the past 5 months.  No depletions felt on exam.  Recommend titration of tube feeding slowly back to goal rate. See specifics in intervention section above.  Medications: reviewed; D5 with NaCl @ 125 ml via IV, lacosamide BID via IV, Keppra BID via IV, Depacon TID via IV  Labs: reviewed; Glucose 102 (H)  NUTRITION - FOCUSED PHYSICAL EXAM: Flowsheet Row Most Recent Value  Orbital Region No depletion  Upper Arm Region No depletion  Thoracic and Lumbar Region No depletion  Buccal Region No depletion  Temple Region No depletion  Clavicle Bone Region No depletion  Clavicle and Acromion Bone Region No depletion  Scapular Bone Region No depletion  Dorsal Hand No depletion  Patellar Region No depletion  Anterior Thigh Region No depletion  Posterior Calf Region No depletion  Edema (RD Assessment) Mild  [Generalized]  Hair Reviewed  Eyes Reviewed  Mouth Reviewed  Skin Reviewed  Nails Reviewed   Diet Order:   Diet Order             Diet NPO time specified  Diet effective now                  EDUCATION NEEDS:  Not appropriate for education at this time  Skin:  Skin Assessment: Reviewed RN Assessment (PEG tube in place)  Last BM:  unknown  Height:  Ht Readings from Last 1 Encounters:  01/21/21 5\' 4"  (1.626  m)   Weight:  Wt Readings from Last 1 Encounters:  01/22/21 98 kg   BMI:  Body mass index is 37.09 kg/m.  Estimated Nutritional Needs:  Kcal:  1900-2100 Protein:  105-120 grams Fluid:  >1.9 L  Vertell Limber, RD, LDN (she/her/hers) Registered Dietitian I After-Hours/Weekend Pager # in [email or web address]

## 2021-01-23 DIAGNOSIS — Z6839 Body mass index (BMI) 39.0-39.9, adult: Secondary | ICD-10-CM | POA: Diagnosis not present

## 2021-01-23 DIAGNOSIS — Z20822 Contact with and (suspected) exposure to covid-19: Secondary | ICD-10-CM | POA: Diagnosis not present

## 2021-01-23 DIAGNOSIS — I69051 Hemiplegia and hemiparesis following nontraumatic subarachnoid hemorrhage affecting right dominant side: Secondary | ICD-10-CM | POA: Diagnosis not present

## 2021-01-23 DIAGNOSIS — I69091 Dysphagia following nontraumatic subarachnoid hemorrhage: Secondary | ICD-10-CM | POA: Diagnosis not present

## 2021-01-23 DIAGNOSIS — I608 Other nontraumatic subarachnoid hemorrhage: Secondary | ICD-10-CM | POA: Diagnosis not present

## 2021-01-23 DIAGNOSIS — G40919 Epilepsy, unspecified, intractable, without status epilepticus: Secondary | ICD-10-CM | POA: Diagnosis not present

## 2021-01-23 DIAGNOSIS — K2980 Duodenitis without bleeding: Secondary | ICD-10-CM | POA: Diagnosis not present

## 2021-01-23 DIAGNOSIS — R9431 Abnormal electrocardiogram [ECG] [EKG]: Secondary | ICD-10-CM | POA: Diagnosis not present

## 2021-01-23 DIAGNOSIS — K859 Acute pancreatitis without necrosis or infection, unspecified: Secondary | ICD-10-CM | POA: Diagnosis not present

## 2021-01-23 DIAGNOSIS — R569 Unspecified convulsions: Secondary | ICD-10-CM | POA: Diagnosis not present

## 2021-01-23 DIAGNOSIS — K2970 Gastritis, unspecified, without bleeding: Secondary | ICD-10-CM | POA: Diagnosis not present

## 2021-01-23 DIAGNOSIS — R1310 Dysphagia, unspecified: Secondary | ICD-10-CM | POA: Diagnosis not present

## 2021-01-23 DIAGNOSIS — E669 Obesity, unspecified: Secondary | ICD-10-CM | POA: Diagnosis not present

## 2021-01-23 DIAGNOSIS — Z86711 Personal history of pulmonary embolism: Secondary | ICD-10-CM | POA: Diagnosis not present

## 2021-01-23 LAB — BASIC METABOLIC PANEL
Anion gap: 10 (ref 5–15)
BUN: 21 mg/dL — ABNORMAL HIGH (ref 6–20)
CO2: 25 mmol/L (ref 22–32)
Calcium: 10 mg/dL (ref 8.9–10.3)
Chloride: 105 mmol/L (ref 98–111)
Creatinine, Ser: 0.61 mg/dL (ref 0.44–1.00)
GFR, Estimated: >60 mL/min (ref >60)
Glucose, Bld: 96 mg/dL (ref 70–99)
Potassium: 4.4 mmol/L (ref 3.5–5.1)
Sodium: 140 mmol/L (ref 135–145)

## 2021-01-23 LAB — CBC
HCT: 49.3 % — ABNORMAL HIGH (ref 36.0–46.0)
Hemoglobin: 15 g/dL (ref 12.0–15.0)
MCH: 30.1 pg (ref 26.0–34.0)
MCHC: 30.4 g/dL (ref 30.0–36.0)
MCV: 98.8 fL (ref 80.0–100.0)
Platelets: 135 10*3/uL — ABNORMAL LOW (ref 150–400)
RBC: 4.99 MIL/uL (ref 3.87–5.11)
RDW: 16.5 % — ABNORMAL HIGH (ref 11.5–15.5)
WBC: 24.7 10*3/uL — ABNORMAL HIGH (ref 4.0–10.5)
nRBC: 0 % (ref 0.0–0.2)

## 2021-01-23 LAB — GLUCOSE, CAPILLARY
Glucose-Capillary: 80 mg/dL (ref 70–99)
Glucose-Capillary: 85 mg/dL (ref 70–99)

## 2021-01-23 LAB — LIPASE, BLOOD: Lipase: 144 U/L — ABNORMAL HIGH (ref 11–51)

## 2021-01-23 MED ORDER — PANTOPRAZOLE SODIUM 40 MG PO PACK
40.0000 mg | PACK | Freq: Every day | ORAL | Status: DC
  Administered 2021-01-23 – 2021-02-02 (×11): 40 mg
  Filled 2021-01-23 (×11): qty 20

## 2021-01-23 NOTE — Progress Notes (Addendum)
PROGRESS NOTE    Patient: Casandra Doffing                            PCP: Charlynne Pander, MD                    DOB: [birth year]            DOA: 01/21/2021 BSJ:[id number]             DOS: 01/23/2021, 3:57 PM   LOS: 2 days   Date of Service: The patient was seen and examined on 01/23/2021  Subjective:   -Appears comfortable, nonverbal  -Patient's daughter Ignacia Palma visited No fevers no vomiting   Brief Narrative:   DENNA FRYBERGER is a 60 y.o. 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  Patient was noted to develop nausea and vomiting this morning at her SNF, tube feeds were stopped, but she continued to have nausea and vomiting was sent to the ED.  She has never had pancreatitis before per report of her family.  Neurologically, her family reports that she has been making improvements since her ruptured aneurysm in January but is often nonverbal and when she does try to speak, cannot be understood.  Family suspects that she was in some pain today as they noted her blood pressure to be higher than normal.  Patient seemed more somnolent than usual today.    ED Course: Upon arrival to the ED, patient is found to be afebrile, saturating well on room air, and with stable blood pressure.  EKG features sinus rhythm with QTc interval of 635 ms.  Chemistry panel notable for elevated BUN to creatinine ratio.  CBC with mild polycythemia and slight thrombocytopenia.  Lactic acid normal.  Lipase 643.  Head CT negative for acute findings.  CT of the abdomen and pelvis concerning for acute pancreatitis without organized fluid collection or necrosis.  Patient was given IV fluids and Zofran in the ED.    Assessment & Plan:   Principal Problem:   Acute  pancreatitis Active Problems:   Aneurysmal subarachnoid hemorrhage (HCC)   Seizures (HCC)   Pulmonary embolus (HCC)   Dysphagia   Prolonged QT interval   Thickened endometrium    1. Acute pancreatitis /gastro duodenitis -No acute distress, not complaining of abdominal pain -Improved nausea vomiting... Continue as needed IV antiemetics - Lipase of 643 >>> 511 >>144  - CT findings consistent with acute pancreatitis as well as gastritis and duodenitis - LFTs normal, no biliary dilation on CT  -Right upper quadrant ultrasound without cholelithiasis or cholecystitis findings - Triglycerides 91  - continue to hold tube feeds,  Continue IV fluids, pain-control, supportive care   - LA 1.9  -Can possibly resume tube feed on 01/24/2021 -Start IV PPI  2. Prolonged QT interval  - QTc is 635 ms in ED  -Repeat EKG on 01/23/2021 with QTC less than 350 -Keep magnesium close to 2 and keep potassium close to 4   3. PE   - Admitted 11/18/20 with small RLL PE without heart strain and was discharged on Eliquis  - Continue Eliquis   - Stable    4. Seizures  -Continue antiepileptics IV for now while observing bowel rest for acute  pancreatitis   5. History of SAH; dysphagia; hemiparesis  -Nonverbal, bedbound   - Pt suffered ruptured intracranial aneurysm in January 2022  - She has ongoing dysphagia with PEG dependence, right hemiparesis, only occasionally tried to speak and is unintelligible  - Hold tube feeds for now, continue supportive care  - stable    6. Endometrial thickening  - Noted incidentally on CT, non-emergent US recommended      ----------------------------------------------------------------------------------------------------------------------------------------------- Nutritional status:  The patient's BMI is: Body mass index is 37.43 kg/m. I agree with the assessment and plan as outlined below: Nutrition Status: Nutrition Problem: Inadequate oral intake Etiology:  inability to eat Signs/Symptoms: NPO status Interventions: Tube feeding, Prostat ------------------------------------------------------------------------------------------------------------------------------------------------ Cultures / Antimicrobials: none    Consultants: None   ------------------------------------------------------------------------------------------------------------------------------------------------  DVT prophylaxis:  SCD/Compression stockings and ZO:XWRUEAVOn:Eliquis Code Status:   Code Status: Full Code  Family Communication: Discussed with her daughter Ms. Neal   Admission status:   Status is: Inpatient  Remains inpatient appropriate because:IV treatments appropriate due to intensity of illness or inability to take PO and Inpatient level of care appropriate due to severity of illness  Dispo: The patient is from: SNF              Anticipated d/c is to: SNF 1-2 days               Patient currently is not medically stable to d/c.   Difficult to place patient No    Level of care: Telemetry   Procedures:   No admission procedures for hospital encounter.    Antimicrobials:  None    Medication:   apixaban  5 mg Per Tube BID   pantoprazole sodium  40 mg Per Tube Daily    acetaminophen **OR** acetaminophen, fentaNYL (SUBLIMAZE) injection   Objective:   Vitals:   01/22/21 1926 01/23/21 0422 01/23/21 0424 01/23/21 1402  BP: 124/84 (!) 134/91  132/81  Pulse: 93 95  90  Resp: 20 16  16   Temp: 99.1 F (37.3 C) 99.1 F (37.3 C)  98.8 F (37.1 C)  TempSrc: Axillary Oral  Oral  SpO2: 95% 100%  100%  Weight:   98.9 kg   Height:        Intake/Output Summary (Last 24 hours) at 01/23/2021 1557 Last data filed at 01/23/2021 0615 Gross per 24 hour  Intake 3015.75 ml  Output --  Net 3015.75 ml   Filed Weights   01/21/21 1534 01/22/21 0500 01/23/21 0424  Weight: 97.6 kg 98 kg 98.9 kg     Examination:   Physical Exam  Constitution: Lethargic-  somnolent -nonverbal... Bedbound HEENT: Normocephalic, PERRL, otherwise with in Normal limits  Chest:Chest symmetric Cardio vascular:  S1/S2, RRR, No murmure, No Rubs or Gallops  pulmonary: Clear to auscultation bilaterally, respirations unlabored, negative wheezes / crackles Abdomen: Soft,  non-distended, bowel sounds,no masses, PEG tube site is clean dry intact Muscular skeletal: generalized weaknesses  Limited exam - in bed,  Neuro: Limited exam -responding to pain stimuli Extremities: No pitting edema lower extremities, +2 pulses  Skin: Dry, warm to touch, negative for any Rashes, No open wounds Wounds: per nursing documentation    ------------------------------------------------------------------------------------------------------------------------------------------    LABs:  CBC Latest Ref Rng & Units 01/23/2021 01/22/2021 01/21/2021  WBC 4.0 - 10.5 K/uL 24.7(H) 18.6(H) 10.2  Hemoglobin 12.0 - 15.0 g/dL 40.915.0 81.114.2 15.5(H)  Hematocrit 36.0 - 46.0 % 49.3(H) 46.3(H) 49.2(H)  Platelets 150 - 400 K/uL 135(L) 132(L) 140(L)   CMP Latest Ref Rng &  Units 01/23/2021 01/22/2021 01/21/2021  Glucose 70 - 99 mg/dL 96 517(G) 017(C)  BUN 6 - 20 mg/dL 94(W) 96(P) 59(F)  Creatinine 0.44 - 1.00 mg/dL 6.38 4.66 5.99  Sodium 135 - 145 mmol/L 140 141 137  Potassium 3.5 - 5.1 mmol/L 4.4 4.9 4.0  Chloride 98 - 111 mmol/L 105 108 101  CO2 22 - 32 mmol/L 25 26 30   Calcium 8.9 - 10.3 mg/dL 9.6 10.6(H)  Total Protein 6.5 - 8.1 g/dL - 5.8(L) 7.0  Total Bilirubin 0.3 - 1.2 mg/dL - 0.5 0.5  Alkaline Phos 38 - 126 U/L - 50 63  AST 15 - 41 U/L - 28 38  ALT 0 - 44 U/L - 9 12     Micro Results Recent Results (from the past 240 hour(s))  Resp Panel by RT-PCR (Flu A&B, Covid) Nasopharyngeal Swab     Status: None   Collection Time: 01/21/21  6:34 PM   Specimen: Nasopharyngeal Swab; Nasopharyngeal(NP) swabs in vial transport medium  Result Value Ref Range Status   SARS Coronavirus 2 by RT PCR NEGATIVE  NEGATIVE Final    Comment: (NOTE) SARS-CoV-2 target nucleic acids are NOT DETECTED.  The SARS-CoV-2 RNA is generally detectable in upper respiratory specimens during the acute phase of infection. The lowest concentration of SARS-CoV-2 viral copies this assay can detect is 138 copies/mL. A negative result does not preclude SARS-Cov-2 infection and should not be used as the sole basis for treatment or other patient management decisions. A negative result may occur with  improper specimen collection/handling, submission of specimen other than nasopharyngeal swab, presence of viral mutation(s) within the areas targeted by this assay, and inadequate number of viral copies(<138 copies/mL). A negative result must be combined with clinical observations, patient history, and epidemiological information. The expected result is Negative.  Fact Sheet for Patients:  03/23/21  Fact Sheet for Healthcare Providers:  BloggerCourse.com  This test is no t yet approved or cleared by the [employer] FDA and  has been authorized for detection and/or diagnosis of SARS-CoV-2 by FDA under an Emergency Use Authorization (EUA). This EUA will remain  in effect (meaning this test can be used) for the duration of the COVID-19 declaration under Section 564(b)(1) of the Act, 21 U.S.C.section 360bbb-3(b)(1), unless the authorization is terminated  or revoked sooner.       Influenza A by PCR NEGATIVE NEGATIVE Final   Influenza B by PCR NEGATIVE NEGATIVE Final    Comment: (NOTE) The Xpert Xpress SARS-CoV-2/FLU/RSV plus assay is intended as an aid in the diagnosis of influenza from Nasopharyngeal swab specimens and should not be used as a sole basis for treatment. Nasal washings and aspirates are unacceptable for Xpert Xpress SARS-CoV-2/FLU/RSV testing.  Fact Sheet for Patients: [place]  Fact Sheet for Healthcare  Providers: BloggerCourse.com  This test is not yet approved or cleared by the [employer] FDA and has been authorized for detection and/or diagnosis of SARS-CoV-2 by FDA under an Emergency Use Authorization (EUA). This EUA will remain in effect (meaning this test can be used) for the duration of the COVID-19 declaration under Section 564(b)(1) of the Act, 21 U.S.C. section 360bbb-3(b)(1), unless the authorization is terminated or revoked.  Performed at [facility], [address], [place], [place]     Radiology Reports CT Head Wo Contrast  Result Date: 01/21/2021 CLINICAL DATA:  Mental status change, unknown cause EXAM: CT HEAD WITHOUT CONTRAST TECHNIQUE: Contiguous axial images were obtained from the base of the skull  through the vertex without intravenous contrast. 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 Ex vacuo dilatation of the left lateral ventricle. No acute infarct or hemorrhage. Vascular: Embolization coils seen in the left middle cerebral artery. No hyperdense vessel. Skull: Prior large left craniectomy. Sinuses/Orbits: No acute findings Other: None IMPRESSION: Prior left craniectomy with large area of encephalomalacia throughout much of the left cerebral hemisphere. Ex vacuo dilatation of the left lateral ventricle. No acute intracranial abnormality. Electronically Signed   By: Charlett Nose M.D.   On: 01/21/2021 19:38   CT Abdomen Pelvis W Contrast  Result Date: 01/21/2021 CLINICAL DATA:  Vomiting peg tube EXAM: CT ABDOMEN AND PELVIS WITH CONTRAST TECHNIQUE: Multidetector CT imaging of the abdomen and pelvis was performed using the standard protocol following bolus administration of intravenous contrast. CONTRAST:  17mL OMNIPAQUE IOHEXOL 350 MG/ML SOLN COMPARISON:  None. FINDINGS: Lower chest: Lung bases demonstrate partial lower lobe consolidation which may  reflect atelectasis or aspiration. No pleural effusion. Cardiomegaly. Hepatobiliary: Subcentimeter hypodense lesion posterior right hepatic lobe too small to further characterize. No calcified gallstone or biliary dilatation Pancreas: Pancreatic enlargement with marked peripancreatic inflammatory change and fluid consistent with acute pancreatitis. No organized fluid collections. Homogeneous enhancement. Spleen: Normal in size without focal abnormality. Adrenals/Urinary Tract: Adrenal glands are normal. Cyst lower pole left kidney. Small bilateral kidney stones. The bladder is thick walled and indistinct. Stomach/Bowel: Stomach nonenlarged. Mild wall thickening and mucosal enhancement of distal stomach and first portion of duodenum. No dilated small bowel. Hyperdense material in the colon. No acute bowel wall thickening. Gastrostomy tube in the distal body of the stomach. Vascular/Lymphatic: Nonaneurysmal aorta with mild atherosclerosis. No suspicious lymph nodes. Portal and splenic veins normally enhance. Reproductive: Prominent size uterus. Endometrial stripe slightly thickened up to 11 mm with focal hypodense area in the posterior lower uterine segment questionable for fibroid. No adnexal mass Other: No free air. Small free fluid in the abdomen and pelvis. 11 by 12 by 9.4 cm suspected foreign body within the subcutaneous fat of the left anterior abdominal wall with small adjacent fluid. This is seen at the level of the umbilicus. Evidence of prior ventral hernia repair with small fat containing recurrent ventral hernia. Musculoskeletal: No acute osseous abnormality IMPRESSION: 1. Findings consistent with acute pancreatitis. No organized fluid collection or evidence for necrosis at this time. Small amount of abdominopelvic ascites 2. Gastrostomy tube present in the distal body of stomach. Mild wall thickening and mucosal enhancement of distal stomach and first portion of duodenum likely reflecting  gastritis/duodenitis versus reactive inflammation from adjacent inflamed pancreas 3. 11 x 12 x 9.4 cm suspected foreign body within the subcutaneous fat of the left anterior body wall at the level of the umbilicus. Small amount of fluid surrounding the foreign body. 4. Cardiomegaly. Patchy atelectasis or aspiration changes at the bases. 5. Thickened endometrial stripe with slightly enlarged appearing uterus for age. Possible hypoechoic mass within the posterior lower uterine segment. Recommend correlation with nonemergent ultrasound. Electronically Signed   By: Jasmine Pang M.D.   On: 01/21/2021 19:47   DG Chest Port 1 View  Result Date: 01/21/2021 CLINICAL DATA:  Altered mental status. EXAM: PORTABLE CHEST 1 VIEW COMPARISON:  July 15, 2020. FINDINGS: Stable cardiomediastinal silhouette. Tracheostomy tube has been removed. Both lungs are clear. The visualized skeletal structures are unremarkable. IMPRESSION: No active disease. Electronically Signed   By: Lupita Raider M.D.   On: 01/21/2021 16:27  US Abdomen Limited RUQ (LIVER/GB)  Result Date: 01/22/2021 CLINICAL DATA:  Acute pancreatitis EXAM: ULTRASOUND ABDOMEN LIMITED RIGHT UPPER QUADRANT COMPARISON:  CT abdomen 01/21/2021 FINDINGS: Gallbladder: Gallbladder wall is within normal limits at 0.2 cm. No gallstones or sonographic Murphy sign identified. Common bile duct: Diameter: 0.3 cm Liver: Mildly coarsened echotexture, without substantial focal lesion identified. Portal vein is patent on color Doppler imaging with normal direction of blood flow towards the liver. Other: Trace perihepatic ascites. IMPRESSION: 1. No findings of gallstones or biliary dilatation. 2. Trace perihepatic ascites. 3. Subtle coarsening of the echotexture of the liver is nonspecific but can be seen in a variety of conditions including hepatitis, metabolic abnormalities, cirrhosis, and hemochromatosis. Electronically Signed   By: Gaylyn Rong M.D.   On: 01/22/2021 10:46     SIGNED: Shon Hale, MD,  [facility],  Pager (please use [email or web address] to page/text) Please use Epic Secure Chat for non-urgent communication (7AM-7PM)  If 7PM-7AM, please contact night-coverage [email or web address], 01/23/2021, 3:57 PM

## 2021-01-23 NOTE — Progress Notes (Signed)
**Note De-identified  Obfuscation** EKG complete and placed in patient chart 

## 2021-01-24 DIAGNOSIS — K859 Acute pancreatitis without necrosis or infection, unspecified: Secondary | ICD-10-CM | POA: Diagnosis not present

## 2021-01-24 DIAGNOSIS — R569 Unspecified convulsions: Secondary | ICD-10-CM | POA: Diagnosis not present

## 2021-01-24 DIAGNOSIS — R9431 Abnormal electrocardiogram [ECG] [EKG]: Secondary | ICD-10-CM | POA: Diagnosis not present

## 2021-01-24 DIAGNOSIS — R1310 Dysphagia, unspecified: Secondary | ICD-10-CM | POA: Diagnosis not present

## 2021-01-24 DIAGNOSIS — I608 Other nontraumatic subarachnoid hemorrhage: Secondary | ICD-10-CM | POA: Diagnosis not present

## 2021-01-24 LAB — GLUCOSE, CAPILLARY
Glucose-Capillary: 66 mg/dL — ABNORMAL LOW (ref 70–99)
Glucose-Capillary: 107 mg/dL — ABNORMAL HIGH (ref 70–99)
Glucose-Capillary: 90 mg/dL (ref 70–99)
Glucose-Capillary: 91 mg/dL (ref 70–99)

## 2021-01-24 LAB — BASIC METABOLIC PANEL
Anion gap: 7 (ref 5–15)
BUN: 18 mg/dL (ref 6–20)
CO2: 23 mmol/L (ref 22–32)
Calcium: 9.4 mg/dL (ref 8.9–10.3)
Chloride: 106 mmol/L (ref 98–111)
Creatinine, Ser: 0.52 mg/dL (ref 0.44–1.00)
GFR, Estimated: >60 mL/min (ref >60)
Glucose, Bld: 67 mg/dL — ABNORMAL LOW (ref 70–99)
Potassium: 4.2 mmol/L (ref 3.5–5.1)
Sodium: 136 mmol/L (ref 135–145)

## 2021-01-24 LAB — CBC
HCT: 40.2 % (ref 36.0–46.0)
Hemoglobin: 12.5 g/dL (ref 12.0–15.0)
MCH: 30.1 pg (ref 26.0–34.0)
MCHC: 31.1 g/dL (ref 30.0–36.0)
MCV: 96.9 fL (ref 80.0–100.0)
Platelets: 151 10*3/uL (ref 150–400)
RBC: 4.15 MIL/uL (ref 3.87–5.11)
RDW: 16.3 % — ABNORMAL HIGH (ref 11.5–15.5)
WBC: 20.8 10*3/uL — ABNORMAL HIGH (ref 4.0–10.5)
nRBC: 0 % (ref 0.0–0.2)

## 2021-01-24 LAB — LIPASE, BLOOD: Lipase: 123 U/L — ABNORMAL HIGH (ref 11–51)

## 2021-01-24 MED ORDER — FREE WATER
30.0000 mL | Status: DC
  Administered 2021-01-24 – 2021-02-02 (×55): 30 mL

## 2021-01-24 MED ORDER — DEXTROSE 50 % IV SOLN: 50.0000 mL | Freq: Once | INTRAVENOUS | Status: AC

## 2021-01-24 MED ORDER — OSMOLITE 1.2 CAL PO LIQD
1000.0000 mL | ORAL | Status: DC
  Administered 2021-01-24: 1000 mL

## 2021-01-24 MED ORDER — DEXTROSE 50 % IV SOLN
INTRAVENOUS | Status: AC
  Administered 2021-01-24: 50 mL via INTRAVENOUS
  Filled 2021-01-24: qty 50

## 2021-01-24 NOTE — Progress Notes (Signed)
PROGRESS NOTE    Patient: Denise Savage                            PCP: Charlynne Pander, MD                    DOB: [birth year]            DOA: 01/21/2021 PYP:[id number]             DOS: 01/24/2021, 4:19 PM   LOS: 3 days   Date of Service: The patient was seen and examined on 01/24/2021  Subjective:   Patient's son Ethelene Browns at bedside -Patient had hypoglycemic episode, at baseline patient is really nonverbal   Brief Narrative:   [name] is a 60 y.o. 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  Patient was noted to develop nausea and vomiting this morning at her SNF, tube feeds were stopped, but she continued to have nausea and vomiting was sent to the ED.  She has never had pancreatitis before per report of her family.  Neurologically, her family reports that she has been making improvements since her ruptured aneurysm in January but is often nonverbal and when she does try to speak, cannot be understood.  Family suspects that she was in some pain today as they noted her blood pressure to be higher than normal.  Patient seemed more somnolent than usual today.    ED Course: Upon arrival to the ED, patient is found to be afebrile, saturating well on room air, and with stable blood pressure.  EKG features sinus rhythm with QTc interval of 635 ms.  Chemistry panel notable for elevated BUN to creatinine ratio.  CBC with mild polycythemia and slight thrombocytopenia.  Lactic acid normal.  Lipase 643.  Head CT negative for acute findings.  CT of the abdomen and pelvis concerning for acute pancreatitis without organized fluid collection or necrosis.  Patient was given IV fluids and Zofran in the ED.    Assessment & Plan:   Principal Problem:   Acute  pancreatitis Active Problems:   Aneurysmal subarachnoid hemorrhage (HCC)   Seizures (HCC)   Pulmonary embolus (HCC)   Dysphagia   Prolonged QT interval   Thickened endometrium    1. Acute pancreatitis /gastro duodenitis -No acute distress, not complaining of abdominal pain -Improved nausea vomiting... Continue as needed IV antiemetics - Lipase of 643 >>> 511 >>144 >>123 - CT findings consistent with acute pancreatitis as well as gastritis and duodenitis - LFTs normal, no biliary dilation on CT  -Right upper quadrant ultrasound without cholelithiasis or cholecystitis findings - Triglycerides 91  - continue to hold tube feeds,  Continue IV fluids, pain-control, supportive care   - LA 1.9  Give Osmolite 1.2 tube feeds at 20 mL an hour starting 01/24/2021 -Continue PPI  2. Prolonged QT interval  - QTc is 635 ms in ED  -Repeat EKG on 01/23/2021 with QTC less than 350 -Keep magnesium close to 2 and keep potassium close to 4   3. PE   - Admitted 11/18/20 with small RLL PE without heart strain and was discharged on Eliquis  - Continue Eliquis   - Stable    4. Seizures  -Continue antiepileptics IV for now  while observing bowel rest for acute pancreatitis   5. History of SAH; dysphagia; hemiparesis  -Nonverbal, bedbound   - Pt suffered ruptured intracranial aneurysm in January 2022  - She has ongoing dysphagia with PEG dependence, right hemiparesis, only occasionally tried to speak and is unintelligible  - Hold tube feeds for now, continue supportive care  - stable    6. Endometrial thickening and possible fibroid - Noted incidentally on CT, non-emergent [place] recommended  -Discussed with patient's son Ethelene Browns, outpatient follow-up advised   7) hypoglycemia due to n.p.o. status--- resume Osmolite 1.2 to feed, patient did receive D50    ----------------------------------------------------------------------------------------------------------------------------------------------- Nutritional status:  The patient's BMI is: Body mass index is 37.99 kg/m. I agree with the assessment and plan as outlined below: Nutrition Status: Nutrition Problem: Inadequate oral intake Etiology: inability to eat Signs/Symptoms: NPO status Interventions: Tube feeding, Prostat ------------------------------------------------------------------------------------------------------------------------------------------------ Cultures / Antimicrobials: none    Consultants: None  ------------------------------------------------------------------------------------------------------------------------------------------------  DVT prophylaxis:  SCD/Compression stockings and DY:JWLKHVF Code Status:   Code Status: Full Code  Family Communication: Discussed with her daughter Ms. Jennette Kettle and son Ethelene Browns  Admission status:   Status is: Inpatient  Remains inpatient appropriate because:IV treatments appropriate due to intensity of illness or inability to take PO and Inpatient level of care appropriate due to severity of illness  Dispo: The patient is from: SNF              Anticipated d/c is to: SNF 1-2 days               Patient currently is not medically stable to d/c.   Difficult to place patient No    Level of care: Telemetry   Procedures:   No admission procedures for hospital encounter.    Antimicrobials:  None    Medication:   apixaban  5 mg Per Tube BID   feeding supplement (OSMOLITE 1.2 CAL)  1,000 mL Per Tube Q24H   free water  30 mL Per Tube Q4H   pantoprazole sodium  40 mg Per Tube Daily    acetaminophen **OR** acetaminophen, fentaNYL (SUBLIMAZE) injection   Objective:   Vitals:   01/23/21 1402 01/23/21 2049 01/24/21 0532 01/24/21 1427  BP: 132/81 139/79 (!) 136/93 132/79  Pulse: 90 (!) 107 (!) 102 99  Resp: 16 20  20 19   Temp: 98.8 F (37.1 C) 98.5 F (36.9 C) 98.9 F (37.2 C) 98.8 F (37.1 C)  TempSrc: Oral Oral Oral Axillary  SpO2: 100% 95% 96% 96%  Weight:   100.4 kg   Height:        Intake/Output Summary (Last 24 hours) at 01/24/2021 1619 Last data filed at 01/24/2021 1526 Gross per 24 hour  Intake 3042.54 ml  Output 1 ml  Net 3041.54 ml   Filed Weights   01/22/21 0500 01/23/21 0424 01/24/21 0532  Weight: 98 kg 98.9 kg 100.4 kg     Examination:   Physical Exam  Constitution:  -nonverbal... Bedbound HEENT: Normocephalic, PERRL, otherwise with in Normal limits  Chest:Chest symmetric Cardio vascular:  S1/S2, RRR, No murmure, No Rubs or Gallops  pulmonary: Clear to auscultation bilaterally, respirations unlabored, negative wheezes / crackles Abdomen: Soft,  non-distended, bowel sounds,no masses, PEG tube site is clean dry intact Muscular skeletal: generalized weaknesses  Limited exam - in bed,  Neuro: Limited exam -responding to pain stimuli Extremities: No pitting edema lower extremities, +2 pulses  Skin: Dry, warm to touch, negative for any Rashes, No open wounds Wounds: per nursing documentation    ------------------------------------------------------------------------------------------------------------------------------------------  LABs:  CBC Latest Ref Rng & Units 01/24/2021 01/23/2021 01/22/2021  WBC 4.0 - 10.5 K/uL 20.8(H) 24.7(H) 18.6(H)  Hemoglobin 12.0 - 15.0 g/dL 14.9 70.2 63.7  Hematocrit 36.0 - 46.0 % 40.2 49.3(H) 46.3(H)  Platelets 150 - 400 K/uL 151 135(L) 132(L)   CMP Latest Ref Rng & Units 01/24/2021 01/23/2021 01/22/2021  Glucose 70 - 99 mg/dL 85(Y) 96 850(Y)  BUN 6 - 20 mg/dL 18 77(A) 12(I)  Creatinine 0.44 - 1.00 mg/dL 7.86 7.67 2.09  Sodium 135 - 145 mmol/L 136 140 141  Potassium 3.5 - 5.1 mmol/L 4.2 4.4 4.9  Chloride 98 - 111 mmol/L 106 105 108  CO2 22 - 32 mmol/L 23 25 26   Calcium 8.9 - 10.3 mg/dL 9.4 9.6  Total Protein 6.5 - 8.1 g/dL - -  5.8(L)  Total Bilirubin 0.3 - 1.2 mg/dL - - 0.5  Alkaline Phos 38 - 126 U/L - - 50  AST 15 - 41 U/L - - 28  ALT 0 - 44 U/L - - 9     Micro Results Recent Results (from the past 240 hour(s))  Resp Panel by RT-PCR (Flu A&B, Covid) Nasopharyngeal Swab     Status: None   Collection Time: 01/21/21  6:34 PM   Specimen: Nasopharyngeal Swab; Nasopharyngeal(NP) swabs in vial transport medium  Result Value Ref Range Status   SARS Coronavirus 2 by RT PCR NEGATIVE NEGATIVE Final    Comment: (NOTE) SARS-CoV-2 target nucleic acids are NOT DETECTED.  The SARS-CoV-2 RNA is generally detectable in upper respiratory specimens during the acute phase of infection. The lowest concentration of SARS-CoV-2 viral copies this assay can detect is 138 copies/mL. A negative result does not preclude SARS-Cov-2 infection and should not be used as the sole basis for treatment or other patient management decisions. A negative result may occur with  improper specimen collection/handling, submission of specimen other than nasopharyngeal swab, presence of viral mutation(s) within the areas targeted by this assay, and inadequate number of viral copies(<138 copies/mL). A negative result must be combined with clinical observations, patient history, and epidemiological information. The expected result is Negative.  Fact Sheet for Patients:  03/23/21  Fact Sheet for Healthcare Providers:  BloggerCourse.com  This test is no t yet approved or cleared by the [employer] FDA and  has been authorized for detection and/or diagnosis of SARS-CoV-2 by FDA under an Emergency Use Authorization (EUA). This EUA will remain  in effect (meaning this test can be used) for the duration of the COVID-19 declaration under Section 564(b)(1) of the Act, 21 U.S.C.section 360bbb-3(b)(1), unless the authorization is terminated  or revoked sooner.       Influenza A by PCR  NEGATIVE NEGATIVE Final   Influenza B by PCR NEGATIVE NEGATIVE Final    Comment: (NOTE) The Xpert Xpress SARS-CoV-2/FLU/RSV plus assay is intended as an aid in the diagnosis of influenza from Nasopharyngeal swab specimens and should not be used as a sole basis for treatment. Nasal washings and aspirates are unacceptable for Xpert Xpress SARS-CoV-2/FLU/RSV testing.  Fact Sheet for Patients: [place]  Fact Sheet for Healthcare Providers: BloggerCourse.com  This test is not yet approved or cleared by the [employer] FDA and has been authorized for detection and/or diagnosis of SARS-CoV-2 by FDA under an Emergency Use Authorization (EUA). This EUA will remain in effect (meaning this test can be used) for the duration of the COVID-19 declaration under Section 564(b)(1) of the Act, 21 U.S.C. section 360bbb-3(b)(1), unless the authorization is  terminated or revoked.  Performed at [facility], [address], [place], [place] [address]     Radiology Reports CT Head Wo Contrast  Result Date: 01/21/2021 CLINICAL DATA:  Mental status change, unknown cause EXAM: CT HEAD WITHOUT CONTRAST TECHNIQUE: Contiguous axial images were obtained from the base of the skull through the vertex without intravenous contrast. 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 Ex vacuo dilatation of the left lateral ventricle. No acute infarct or hemorrhage. Vascular: Embolization coils seen in the left middle cerebral artery. No hyperdense vessel. Skull: Prior large left craniectomy. Sinuses/Orbits: No acute findings Other: None IMPRESSION: Prior left craniectomy with large area of encephalomalacia throughout much of the left cerebral hemisphere. Ex vacuo dilatation of the left lateral ventricle. No acute intracranial abnormality. Electronically Signed   By: Charlett Nose M.D.   On:  01/21/2021 19:38   CT Abdomen Pelvis W Contrast  Result Date: 01/21/2021 CLINICAL DATA:  Vomiting peg tube EXAM: CT ABDOMEN AND PELVIS WITH CONTRAST TECHNIQUE: Multidetector CT imaging of the abdomen and pelvis was performed using the standard protocol following bolus administration of intravenous contrast. CONTRAST:  63mL OMNIPAQUE IOHEXOL 350 MG/ML SOLN COMPARISON:  None. FINDINGS: Lower chest: Lung bases demonstrate partial lower lobe consolidation which may reflect atelectasis or aspiration. No pleural effusion. Cardiomegaly. Hepatobiliary: Subcentimeter hypodense lesion posterior right hepatic lobe too small to further characterize. No calcified gallstone or biliary dilatation Pancreas: Pancreatic enlargement with marked peripancreatic inflammatory change and fluid consistent with acute pancreatitis. No organized fluid collections. Homogeneous enhancement. Spleen: Normal in size without focal abnormality. Adrenals/Urinary Tract: Adrenal glands are normal. Cyst lower pole left kidney. Small bilateral kidney stones. The bladder is thick walled and indistinct. Stomach/Bowel: Stomach nonenlarged. Mild wall thickening and mucosal enhancement of distal stomach and first portion of duodenum. No dilated small bowel. Hyperdense material in the colon. No acute bowel wall thickening. Gastrostomy tube in the distal body of the stomach. Vascular/Lymphatic: Nonaneurysmal aorta with mild atherosclerosis. No suspicious lymph nodes. Portal and splenic veins normally enhance. Reproductive: Prominent size uterus. Endometrial stripe slightly thickened up to 11 mm with focal hypodense area in the posterior lower uterine segment questionable for fibroid. No adnexal mass Other: No free air. Small free fluid in the abdomen and pelvis. 11 by 12 by 9.4 cm suspected foreign body within the subcutaneous fat of the left anterior abdominal wall with small adjacent fluid. This is seen at the level of the umbilicus. Evidence of prior  ventral hernia repair with small fat containing recurrent ventral hernia. Musculoskeletal: No acute osseous abnormality IMPRESSION: 1. Findings consistent with acute pancreatitis. No organized fluid collection or evidence for necrosis at this time. Small amount of abdominopelvic ascites 2. Gastrostomy tube present in the distal body of stomach. Mild wall thickening and mucosal enhancement of distal stomach and first portion of duodenum likely reflecting gastritis/duodenitis versus reactive inflammation from adjacent inflamed pancreas 3. 11 x 12 x 9.4 cm suspected foreign body within the subcutaneous fat of the left anterior body wall at the level of the umbilicus. Small amount of fluid surrounding the foreign body. 4. Cardiomegaly. Patchy atelectasis or aspiration changes at the bases. 5. Thickened endometrial stripe with slightly enlarged appearing uterus for age. Possible hypoechoic mass within the posterior lower uterine segment. Recommend correlation with nonemergent ultrasound. Electronically Signed   By: Jasmine Pang M.D.   On: 01/21/2021 19:47   DG Chest Port 1 View  Result Date: 01/21/2021 CLINICAL  DATA:  Altered mental status. EXAM: PORTABLE CHEST 1 VIEW COMPARISON:  July 15, 2020. FINDINGS: Stable cardiomediastinal silhouette. Tracheostomy tube has been removed. Both lungs are clear. The visualized skeletal structures are unremarkable. IMPRESSION: No active disease. Electronically Signed   By: Lupita RaiderJames  Green Jr M.D.   On: 01/21/2021 16:27   US Abdomen Limited RUQ (LIVER/GB)  Result Date: 01/22/2021 CLINICAL DATA:  Acute pancreatitis EXAM: ULTRASOUND ABDOMEN LIMITED RIGHT UPPER QUADRANT COMPARISON:  CT abdomen 01/21/2021 FINDINGS: Gallbladder: Gallbladder wall is within normal limits at 0.2 cm. No gallstones or sonographic Murphy sign identified. Common bile duct: Diameter: 0.3 cm Liver: Mildly coarsened echotexture, without substantial focal lesion identified. Portal vein is patent on color  Doppler imaging with normal direction of blood flow towards the liver. Other: Trace perihepatic ascites. IMPRESSION: 1. No findings of gallstones or biliary dilatation. 2. Trace perihepatic ascites. 3. Subtle coarsening of the echotexture of the liver is nonspecific but can be seen in a variety of conditions including hepatitis, metabolic abnormalities, cirrhosis, and hemochromatosis. Electronically Signed   By: Gaylyn RongWalter  Liebkemann M.D.   On: 01/22/2021 10:46    SIGNED: Shon Haleourage Barba Solt, MD,  [facility],  Pager (please use [email or web address] to page/text) Please use Epic Secure Chat for non-urgent communication (7AM-7PM)  If 7PM-7AM, please contact night-coverage [email or web address], 01/24/2021, 4:19 PM

## 2021-01-24 NOTE — Progress Notes (Signed)
Hypoglycemic Event  CBG: 66  Treatment: D50 50 mL (25 gm)  Symptoms: None  Follow-up CBG: Time:1037 CBG Result:107  Possible Reasons for Event: Other: Patient NPO  Comments/MD notified: MD Courage aware.    Jesus Genera

## 2021-01-25 DIAGNOSIS — R569 Unspecified convulsions: Secondary | ICD-10-CM | POA: Diagnosis not present

## 2021-01-25 DIAGNOSIS — R1310 Dysphagia, unspecified: Secondary | ICD-10-CM | POA: Diagnosis not present

## 2021-01-25 DIAGNOSIS — I608 Other nontraumatic subarachnoid hemorrhage: Secondary | ICD-10-CM | POA: Diagnosis not present

## 2021-01-25 DIAGNOSIS — K859 Acute pancreatitis without necrosis or infection, unspecified: Secondary | ICD-10-CM | POA: Diagnosis not present

## 2021-01-25 DIAGNOSIS — R9431 Abnormal electrocardiogram [ECG] [EKG]: Secondary | ICD-10-CM | POA: Diagnosis not present

## 2021-01-25 LAB — GLUCOSE, CAPILLARY
Glucose-Capillary: 101 mg/dL — ABNORMAL HIGH (ref 70–99)
Glucose-Capillary: 111 mg/dL — ABNORMAL HIGH (ref 70–99)
Glucose-Capillary: 114 mg/dL — ABNORMAL HIGH (ref 70–99)
Glucose-Capillary: 116 mg/dL — ABNORMAL HIGH (ref 70–99)
Glucose-Capillary: 66 mg/dL — ABNORMAL LOW (ref 70–99)
Glucose-Capillary: 70 mg/dL (ref 70–99)
Glucose-Capillary: 70 mg/dL (ref 70–99)
Glucose-Capillary: 76 mg/dL (ref 70–99)

## 2021-01-25 LAB — LIPASE, BLOOD: Lipase: 213 U/L — ABNORMAL HIGH (ref 11–51)

## 2021-01-25 MED ORDER — DEXTROSE 50 % IV SOLN: 12.5000 g | INTRAVENOUS | Status: AC

## 2021-01-25 MED ORDER — DEXTROSE 50 % IV SOLN
INTRAVENOUS | Status: AC
  Administered 2021-01-25: 50 mL
  Filled 2021-01-25: qty 50

## 2021-01-25 MED ORDER — OSMOLITE 1.2 CAL PO LIQD: 1000.0000 mL | ORAL | Status: DC

## 2021-01-25 MED ORDER — PROSOURCE TF PO LIQD
45.0000 mL | Freq: Two times a day (BID) | ORAL | Status: DC
  Administered 2021-01-25 – 2021-01-27 (×5): 45 mL
  Filled 2021-01-25 (×5): qty 45

## 2021-01-25 MED ORDER — OSMOLITE 1.2 CAL PO LIQD
1000.0000 mL | ORAL | Status: DC
  Administered 2021-01-25 – 2021-01-26 (×2): 1000 mL

## 2021-01-25 NOTE — Progress Notes (Signed)
Patient Blood Glucose was 70 mg/dl. Patient did not have IV at the time, patient pale. Patient given 4 oz of juice with sugar by Peg tube. Re-checked blood glucose 76. MD Courage aware.

## 2021-01-25 NOTE — Progress Notes (Addendum)
Initial Nutrition Assessment  DOCUMENTATION CODES:   Obesity unspecified  INTERVENTION:  Advance Osmolite 1.2 @ 75ml/hr via PEG tube. Goal rate 55 ml/hr (1320 ml/day).  ProSource TF 45 ml BID per tube  Provides:1784 kcal, 95 gr protein, 1082 ml water  Free water 150 ml q 4 hrs -if no IV fluids.  NUTRITION DIAGNOSIS:   Inadequate oral intake related to inability to eat as evidenced by NPO status.  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  Patient was noted to develop nausea and vomiting this morning at her SNF, tube feeds were stopped, but she continued to have nausea and vomiting was sent to the ED.  She has never had pancreatitis before per report of her family.  Neurologically, her family reports that she has been making improvements since her ruptured aneurysm in January but is often nonverbal and when she does try to speak, cannot be understood.  Family suspects that she was in some pain today as they noted her blood pressure to be higher than normal.  Patient seemed more somnolent than usual today.  8/15 Patient discussed during rounds. Cleared per MD to continue advancing to goal rate. RD to adjust. Discussed with nursing- patient tolerating nutrition at current rate. Hypoglycemia this morning. D5% being resumed. Last BM unknown??  Medications reviewed and include: Reviewed.   Labs: BMP Latest Ref Rng & Units 01/24/2021 01/23/2021 01/22/2021  Glucose 70 - 99 mg/dL 21(H) 96 086(V)  BUN 6 - 20 mg/dL 18 78(I) 69(G)  Creatinine 0.44 - 1.00 mg/dL 2.95 2.84 1.32  Sodium  135 - 145 mmol/L 136 140 141  Potassium 3.5 - 5.1 mmol/L 4.2 4.4 4.9  Chloride 98 - 111 mmol/L 106 105 108  CO2 22 - 32 mmol/L 23 25 26   Calcium 8.9 - 10.3 mg/dL 9.4 9.6      Diet Order:   Diet Order             Diet NPO time specified  Diet effective now                   EDUCATION NEEDS:   Not appropriate for education at this time  Skin:  Skin Assessment: Reviewed RN Assessment (PEG tube in place)  Last BM:  unknown  Height:   Ht Readings from Last 1 Encounters:  01/21/21 5\' 4"  (1.626 m)    Weight:   Wt Readings from Last 1 Encounters:  01/25/21 104.1 kg    Ideal Body Weight:   55 kg  BMI:  Body mass index is 39.39 kg/m.  Estimated Nutritional Needs:   Kcal:  1900-2100  Protein:  105-120 grams  Fluid:  >1.9 L   MS,RD,CSG,LDN Contact: AMION

## 2021-01-25 NOTE — TOC Progression Note (Signed)
Transition of Care [facility]) - Progression Note    Patient Details  Name: Denise Savage MRN: [id number] Date of Birth: 03-Jan-1961  [facility]) CM/SW Contact  Karn [place], [place] Phone Number: 01/25/2021, 10:42 AM  Clinical Narrative:  Pt admitted from [facility]. Per Eunice Blase at facility, pt will be long term and okay to return. LCSW confirmed plan with pt's daughter, [name]. FL2 completed. TOC will continue to follow.        Barriers to Discharge: Continued Medical Work up  Expected Discharge Plan and Services                                                 Social Determinants of Health (SDOH) Interventions    Readmission Risk Interventions Readmission Risk Prevention Plan 09/08/2020  Transportation Screening Complete  PCP or Specialist Appt within 5-7 Days Complete  Home Care Screening Complete  Medication Review (RN CM) Complete  Some recent data might be hidden

## 2021-01-25 NOTE — Progress Notes (Signed)
Hypoglycemic Event  CBG: 66  Treatment: D50 50 mL (25 gm)  Symptoms: None  Follow-up CBG: Time:1752 CBG Result:114  Possible Reasons for Event: Unknown  Comments/MD notified: Per MD Courage give 1 amp (50 mL).     Jesus Genera

## 2021-01-25 NOTE — Progress Notes (Signed)
PROGRESS NOTE    Patient: Casandra Doffing                            PCP: Charlynne Pander, MD                    DOB: 1961/05/18            DOA: 01/21/2021 VOP:[id number]             DOS: 01/25/2021, 7:32 PM   LOS: 4 days   Date of Service: The patient was seen and examined on 01/25/2021  Subjective:   Patient's son Ethelene Browns  and daughter [name] at bedside -Patient had hypoglycemic episode again--- dextrose given, at baseline patient is really nonverbal   Brief Narrative:   NYA BALAM is a 60 y.o. 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  Patient was noted to develop nausea and vomiting this morning at her SNF, tube feeds were stopped, but she continued to have nausea and vomiting was sent to the ED.  She has never had pancreatitis before per report of her family.  Neurologically, her family reports that she has been making improvements since her ruptured aneurysm in January but is often nonverbal and when she does try to speak, cannot be understood.  Family suspects that she was in some pain today as they noted her blood pressure to be higher than normal.  Patient seemed more somnolent than usual today.    ED Course: Upon arrival to the ED, patient is found to be afebrile, saturating well on room air, and with stable blood pressure.  EKG features sinus rhythm with QTc interval of 635 ms.  Chemistry panel notable for elevated BUN to creatinine ratio.  CBC with mild polycythemia and slight thrombocytopenia.  Lactic acid normal.  Lipase 643.  Head CT negative for acute findings.  CT of the abdomen and pelvis concerning for acute pancreatitis without organized fluid collection or necrosis.  Patient was given IV fluids and Zofran in the ED.    Assessment &  Plan:   Principal Problem:   Acute pancreatitis Active Problems:   Aneurysmal subarachnoid hemorrhage (HCC)   Seizures (HCC)   Pulmonary embolus (HCC)   Dysphagia   Prolonged QT interval   Thickened endometrium    1. Acute pancreatitis /gastro duodenitis -No acute distress, not complaining of abdominal pain -Improved nausea vomiting... Continue as needed IV antiemetics - Lipase of 643 >>> 511 >>144 >>123>.213 - CT findings consistent with acute pancreatitis as well as gastritis and duodenitis - LFTs normal, no biliary dilation on CT  -Right upper quadrant ultrasound without cholelithiasis or cholecystitis findings - Triglycerides 91  - continue to hold tube feeds,  Continue IV fluids, pain-control, supportive care   - LA 1.9  -Patient was restarted on tube feed on 01/24/2021, titrate up tube feeding, follow lipase and clinical response -Continue PPI  2. Prolonged QT interval  - QTc is 635 ms in ED  -Repeat EKG on 01/23/2021 with QTC less than 350 -Keep magnesium close to 2 and keep potassium close to 4   3. PE   - Admitted 11/18/20 with small RLL PE without heart strain and was discharged on Eliquis  - Continue Eliquis   -  Stable    4. Seizures  -Continue antiepileptics IV for now while observing bowel rest for acute pancreatitis   5. History of SAH; dysphagia; hemiparesis  -Nonverbal, bedbound   - Pt suffered ruptured intracranial aneurysm in January 2022  - She has ongoing dysphagia with PEG dependence, right hemiparesis, only occasionally tried to speak and is unintelligible  - Hold tube feeds for now, continue supportive care  - stable    6. Endometrial thickening and possible fibroid - Noted incidentally on CT, non-emergent [place] recommended  -Discussed with patient's son Ethelene Browns, outpatient follow-up advised   7) hypoglycemia due to n.p.o. status---  patient did receive D50 again on 01/25/2021 --Anticipate blood sugars will improve once patient tolerating tube  feeding better   ----------------------------------------------------------------------------------------------------------------------------------------------- Nutritional status:  The patient's BMI is: Body mass index is 39.39 kg/m. I agree with the assessment and plan as outlined below: Nutrition Status: Nutrition Problem: Inadequate oral intake Etiology: inability to eat Signs/Symptoms: NPO status Interventions: Tube feeding, Prostat ------------------------------------------------------------------------------------------------------------------------------------------------ Cultures / Antimicrobials: none    Consultants: None  ------------------------------------------------------------------------------------------------------------------------------------------------  DVT prophylaxis:  SCD/Compression stockings and TG:GYIRSWN Code Status:   Code Status: Full Code  Family Communication: Discussed with her daughters and son Ethelene Browns  Admission status:   Status is: Inpatient  Remains inpatient appropriate because:IV treatments appropriate due to intensity of illness or inability to take PO and Inpatient level of care appropriate due to severity of illness  Dispo: The patient is from: SNF              Anticipated d/c is to: SNF 1-2 days               Patient currently is not medically stable to d/c.   Difficult to place patient No    Level of care: Telemetry   Procedures:   No admission procedures for hospital encounter.    Antimicrobials:  None    Medication:   apixaban  5 mg Per Tube BID   feeding supplement (OSMOLITE 1.2 CAL)  1,000 mL Per Tube Q24H   feeding supplement (PROSource TF)  45 mL Per Tube BID   free water  30 mL Per Tube Q4H   pantoprazole sodium  40 mg Per Tube Daily    acetaminophen **OR** acetaminophen, fentaNYL (SUBLIMAZE) injection   Objective:   Vitals:   01/25/21 0500 01/25/21 0615 01/25/21 1205 01/25/21 1424  BP:  (!) 147/86  119/77 121/69  Pulse:  (!) 103 95 (!) 102  Resp:  19 20 20   Temp:  98.3 F (36.8 C) 98.5 F (36.9 C) (!) 97.4 F (36.3 C)  TempSrc:  Oral Axillary Oral  SpO2:   98% 100%  Weight: 104.1 kg     Height:        Intake/Output Summary (Last 24 hours) at 01/25/2021 1932 Last data filed at 01/25/2021 1747 Gross per 24 hour  Intake 2046.24 ml  Output 100 ml  Net 1946.24 ml   Filed Weights   01/23/21 0424 01/24/21 0532 01/25/21 0500  Weight: 98.9 kg 100.4 kg 104.1 kg     Examination:   Physical Exam  Constitution:  -nonverbal... Bedbound HEENT: Normocephalic, PERRL, otherwise with in Normal limits  Chest:Chest symmetric Cardio vascular:  S1/S2, RRR, No murmure, No Rubs or Gallops  pulmonary: Clear to auscultation bilaterally, respirations unlabored, negative wheezes / crackles Abdomen: Soft,  non-distended, bowel sounds,no masses, PEG tube site is clean dry intact Muscular skeletal: generalized weaknesses  Limited exam - in bed,  Neuro: Limited exam -  responding to pain stimuli Extremities: No pitting edema lower extremities, +2 pulses  Skin: Dry, warm to touch, negative for any Rashes, No open wounds Wounds: per nursing documentation    ------------------------------------------------------------------------------------------------------------------------------------------    LABs:  CBC Latest Ref Rng & Units 01/24/2021 01/23/2021 01/22/2021  WBC 4.0 - 10.5 K/uL 20.8(H) 24.7(H) 18.6(H)  Hemoglobin 12.0 - 15.0 g/dL 16.112.5 09.615.0 04.514.2  Hematocrit 36.0 - 46.0 % 40.2 49.3(H) 46.3(H)  Platelets 150 - 400 K/uL 151 135(L) 132(L)   CMP Latest Ref Rng & Units 01/24/2021 01/23/2021 01/22/2021  Glucose 70 - 99 mg/dL 40(J67(L) 96 811(B102(H)  BUN 6 - 20 mg/dL 18 14(N21(H) 82(N21(H)  Creatinine 0.44 - 1.00 mg/dL 5.620.52 1.300.61 8.650.70  Sodium 135 - 145 mmol/L 136 140 141  Potassium 3.5 - 5.1 mmol/L 4.2 4.4 4.9  Chloride 98 - 111 mmol/L 106 105 108  CO2 22 - 32 mmol/L 23 25 26   Calcium 8.9 - 10.3 mg/dL 9.4 78.410.0 9.6   Total Protein 6.5 - 8.1 g/dL - - 5.8(L)  Total Bilirubin 0.3 - 1.2 mg/dL - - 0.5  Alkaline Phos 38 - 126 U/L - - 50  AST 15 - 41 U/L - - 28  ALT 0 - 44 U/L - - 9     Micro Results Recent Results (from the past 240 hour(s))  Resp Panel by RT-PCR (Flu A&B, Covid) Nasopharyngeal Swab     Status: None   Collection Time: 01/21/21  6:34 PM   Specimen: Nasopharyngeal Swab; Nasopharyngeal(NP) swabs in vial transport medium  Result Value Ref Range Status   SARS Coronavirus 2 by RT PCR NEGATIVE NEGATIVE Final    Comment: (NOTE) SARS-CoV-2 target nucleic acids are NOT DETECTED.  The SARS-CoV-2 RNA is generally detectable in upper respiratory specimens during the acute phase of infection. The lowest concentration of SARS-CoV-2 viral copies this assay can detect is 138 copies/mL. A negative result does not preclude SARS-Cov-2 infection and should not be used as the sole basis for treatment or other patient management decisions. A negative result may occur with  improper specimen collection/handling, submission of specimen other than nasopharyngeal swab, presence of viral mutation(s) within the areas targeted by this assay, and inadequate number of viral copies(<138 copies/mL). A negative result must be combined with clinical observations, patient history, and epidemiological information. The expected result is Negative.  Fact Sheet for Patients:  BloggerCourse.comhttps://www.fda.gov/media/152166/download  Fact Sheet for Healthcare Providers:  SeriousBroker.ithttps://www.fda.gov/media/152162/download  This test is no t yet approved or cleared by the [place] FDA and  has been authorized for detection and/or diagnosis of SARS-CoV-2 by FDA under an Emergency Use Authorization (EUA). This EUA will remain  in effect (meaning this test can be used) for the duration of the COVID-19 declaration under Section 564(b)(1) of the Act, 21 U.S.C.section 360bbb-3(b)(1), unless the authorization is terminated  or revoked sooner.        Influenza A by PCR NEGATIVE NEGATIVE Final   Influenza B by PCR NEGATIVE NEGATIVE Final    Comment: (NOTE) The Xpert Xpress SARS-CoV-2/FLU/RSV plus assay is intended as an aid in the diagnosis of influenza from Nasopharyngeal swab specimens and should not be used as a sole basis for treatment. Nasal washings and aspirates are unacceptable for Xpert Xpress SARS-CoV-2/FLU/RSV testing.  Fact Sheet for Patients: BloggerCourse.comhttps://www.fda.gov/media/152166/download  Fact Sheet for Healthcare Providers: SeriousBroker.ithttps://www.fda.gov/media/152162/download  This test is not yet approved or cleared by the [place] FDA and has been authorized for detection and/or diagnosis of SARS-CoV-2 by FDA under an Emergency  Use Authorization (EUA). This EUA will remain in effect (meaning this test can be used) for the duration of the COVID-19 declaration under Section 564(b)(1) of the Act, 21 U.S.C. section 360bbb-3(b)(1), unless the authorization is terminated or revoked.  Performed at [facility], [address], [place], [place] [address]     Radiology Reports CT Head Wo Contrast  Result Date: 01/21/2021 CLINICAL DATA:  Mental status change, unknown cause EXAM: CT HEAD WITHOUT CONTRAST TECHNIQUE: Contiguous axial images were obtained from the base of the skull through the vertex without intravenous contrast. 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 Ex vacuo dilatation of the left lateral ventricle. No acute infarct or hemorrhage. Vascular: Embolization coils seen in the left middle cerebral artery. No hyperdense vessel. Skull: Prior large left craniectomy. Sinuses/Orbits: No acute findings Other: None IMPRESSION: Prior left craniectomy with large area of encephalomalacia throughout much of the left cerebral hemisphere. Ex vacuo dilatation of the left lateral ventricle. No acute intracranial abnormality. Electronically Signed    By: Charlett Nose M.D.   On: 01/21/2021 19:38   CT Abdomen Pelvis W Contrast  Result Date: 01/21/2021 CLINICAL DATA:  Vomiting peg tube EXAM: CT ABDOMEN AND PELVIS WITH CONTRAST TECHNIQUE: Multidetector CT imaging of the abdomen and pelvis was performed using the standard protocol following bolus administration of intravenous contrast. CONTRAST:  71mL OMNIPAQUE IOHEXOL 350 MG/ML SOLN COMPARISON:  None. FINDINGS: Lower chest: Lung bases demonstrate partial lower lobe consolidation which may reflect atelectasis or aspiration. No pleural effusion. Cardiomegaly. Hepatobiliary: Subcentimeter hypodense lesion posterior right hepatic lobe too small to further characterize. No calcified gallstone or biliary dilatation Pancreas: Pancreatic enlargement with marked peripancreatic inflammatory change and fluid consistent with acute pancreatitis. No organized fluid collections. Homogeneous enhancement. Spleen: Normal in size without focal abnormality. Adrenals/Urinary Tract: Adrenal glands are normal. Cyst lower pole left kidney. Small bilateral kidney stones. The bladder is thick walled and indistinct. Stomach/Bowel: Stomach nonenlarged. Mild wall thickening and mucosal enhancement of distal stomach and first portion of duodenum. No dilated small bowel. Hyperdense material in the colon. No acute bowel wall thickening. Gastrostomy tube in the distal body of the stomach. Vascular/Lymphatic: Nonaneurysmal aorta with mild atherosclerosis. No suspicious lymph nodes. Portal and splenic veins normally enhance. Reproductive: Prominent size uterus. Endometrial stripe slightly thickened up to 11 mm with focal hypodense area in the posterior lower uterine segment questionable for fibroid. No adnexal mass Other: No free air. Small free fluid in the abdomen and pelvis. 11 by 12 by 9.4 cm suspected foreign body within the subcutaneous fat of the left anterior abdominal wall with small adjacent fluid. This is seen at the level of the  umbilicus. Evidence of prior ventral hernia repair with small fat containing recurrent ventral hernia. Musculoskeletal: No acute osseous abnormality IMPRESSION: 1. Findings consistent with acute pancreatitis. No organized fluid collection or evidence for necrosis at this time. Small amount of abdominopelvic ascites 2. Gastrostomy tube present in the distal body of stomach. Mild wall thickening and mucosal enhancement of distal stomach and first portion of duodenum likely reflecting gastritis/duodenitis versus reactive inflammation from adjacent inflamed pancreas 3. 11 x 12 x 9.4 cm suspected foreign body within the subcutaneous fat of the left anterior body wall at the level of the umbilicus. Small amount of fluid surrounding the foreign body. 4. Cardiomegaly. Patchy atelectasis or aspiration changes at the bases. 5. Thickened endometrial stripe with slightly enlarged appearing uterus for age. Possible hypoechoic mass within  the posterior lower uterine segment. Recommend correlation with nonemergent ultrasound. Electronically Signed   By: Jasmine Pang M.D.   On: 01/21/2021 19:47   DG Chest Port 1 View  Result Date: 01/21/2021 CLINICAL DATA:  Altered mental status. EXAM: PORTABLE CHEST 1 VIEW COMPARISON:  July 15, 2020. FINDINGS: Stable cardiomediastinal silhouette. Tracheostomy tube has been removed. Both lungs are clear. The visualized skeletal structures are unremarkable. IMPRESSION: No active disease. Electronically Signed   By: Lupita Raider M.D.   On: 01/21/2021 16:27   US Abdomen Limited RUQ (LIVER/GB)  Result Date: 01/22/2021 CLINICAL DATA:  Acute pancreatitis EXAM: ULTRASOUND ABDOMEN LIMITED RIGHT UPPER QUADRANT COMPARISON:  CT abdomen 01/21/2021 FINDINGS: Gallbladder: Gallbladder wall is within normal limits at 0.2 cm. No gallstones or sonographic Murphy sign identified. Common bile duct: Diameter: 0.3 cm Liver: Mildly coarsened echotexture, without substantial focal lesion identified. Portal  vein is patent on color Doppler imaging with normal direction of blood flow towards the liver. Other: Trace perihepatic ascites. IMPRESSION: 1. No findings of gallstones or biliary dilatation. 2. Trace perihepatic ascites. 3. Subtle coarsening of the echotexture of the liver is nonspecific but can be seen in a variety of conditions including hepatitis, metabolic abnormalities, cirrhosis, and hemochromatosis. Electronically Signed   By: Gaylyn Rong M.D.   On: 01/22/2021 10:46    SIGNED: Shon Hale, MD,  [facility],  Pager (please use [email or web address] to page/text) Please use Epic Secure Chat for non-urgent communication (7AM-7PM)  If 7PM-7AM, please contact night-coverage [email or web address], 01/25/2021, 7:32 PM

## 2021-01-25 NOTE — [place] FL2 (Signed)
[place] MEDICAID FL2 LEVEL OF CARE SCREENING TOOL     IDENTIFICATION  Patient Name: Denise Savage Birthdate: 09/03/1960 Sex: female Admission Date (Current Location): 01/21/2021  [place] and [place] Number:  Aaron Edelman [id number] Facility and Address:  [facility],  [address] [place] [address]      Provider Number: [id number]  Attending Physician Name and Address:  Shon Hale, MD  Relative Name and Phone Number:       Current Level of Care: Hospital Recommended Level of Care: Skilled Nursing Facility Prior Approval Number:    Date Approved/Denied:   PASRR Number:    Discharge Plan: SNF    Current Diagnoses: Patient Active Problem List   Diagnosis Date Noted   Acute pancreatitis 01/21/2021   Seizures (HCC)    Pulmonary embolus (HCC)    Dysphagia    Prolonged QT interval    Thickened endometrium    Acute respiratory failure (HCC)    ICH (intracerebral hemorrhage) (HCC) 06/18/2020   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   SAH (subarachnoid hemorrhage) (HCC) 06/15/2020    Orientation RESPIRATION BLADDER Height & Weight     Self  Normal Incontinent Weight: 229 lb 8 oz (104.1 kg) Height:  5\' 4"  (162.6 cm)  BEHAVIORAL SYMPTOMS/MOOD NEUROLOGICAL BOWEL NUTRITION STATUS    Convulsions/Seizures Incontinent Feeding tube  AMBULATORY STATUS COMMUNICATION OF NEEDS Skin   Total Care  (difficult to communicate) Other (Comment) (Right groin raw. Left groin open skin.)                       Personal Care Assistance Level of Assistance  Bathing, Dressing, Feeding Bathing Assistance: Maximum assistance Feeding assistance: Limited assistance Dressing Assistance: Maximum assistance     Functional Limitations Info  Sight, Hearing, Speech Sight Info: Impaired Hearing Info: Adequate Speech Info: Adequate    SPECIAL CARE FACTORS FREQUENCY                       Contractures      Additional Factors Info  Code  Status, Allergies Code Status Info: Full code Allergies Info: Hydrocodone           Current Medications (01/25/2021):  This is the current hospital active medication list Current Facility-Administered Medications  Medication Dose Route Frequency Provider Last Rate Last Admin   acetaminophen (TYLENOL) tablet 650 mg  650 mg Per Tube Q6H PRN Opyd, 01/27/2021, MD       Or   acetaminophen (TYLENOL) suppository 650 mg  650 mg Rectal Q6H PRN Opyd, Lavone Neri, MD       apixaban (ELIQUIS) tablet 5 mg  5 mg Per Tube BID Opyd, Lavone Neri, MD   5 mg at 01/25/21 1013   dextrose 5 %-0.45 % sodium chloride infusion   Intravenous Continuous Emokpae, Courage, MD 100 mL/hr at 01/24/21 1932 New Bag at 01/24/21 1932   feeding supplement (OSMOLITE 1.2 CAL) liquid 1,000 mL  1,000 mL Per Tube Q24H 01/26/21, MD 20 mL/hr at 01/24/21 1619 Infusion Verify at 01/24/21 1619   fentaNYL (SUBLIMAZE) injection 12.5-50 mcg  12.5-50 mcg Intravenous Q2H PRN Opyd, [birth year]1982, MD       free water 30 mL  30 mL Per Tube Q4H Emokpae, Courage, MD   30 mL at 01/25/21 0800   lacosamide (VIMPAT) 50 mg in sodium chloride 0.9 % 25 mL IVPB  50 mg Intravenous Q12H Opyd, 01/27/21, MD 60 mL/hr at 01/24/21 2142 50 mg  at 01/24/21 2142   levETIRAcetam (KEPPRA) IVPB 1500 mg/ 100 mL premix  1,500 mg Intravenous Q12H Opyd, Lavone Neri, MD 400 mL/hr at 01/25/21 0325 1,500 mg at 01/25/21 0325   pantoprazole sodium (PROTONIX) 40 mg/20 mL oral suspension 40 mg  40 mg Per Tube Daily Emokpae, Courage, MD   40 mg at 01/25/21 1013   valproate (DEPACON) 750 mg in dextrose 5 % 50 mL IVPB  750 mg Intravenous Q8H Opyd, Lavone Neri, MD 57.5 mL/hr at 01/25/21 0505 750 mg at 01/25/21 0505     Discharge Medications: Please see discharge summary for a list of discharge medications.  Relevant Imaging Results:  Relevant Lab Results:   Additional Information    Karn Cassis, LCSW

## 2021-01-26 DIAGNOSIS — K859 Acute pancreatitis without necrosis or infection, unspecified: Secondary | ICD-10-CM | POA: Diagnosis not present

## 2021-01-26 DIAGNOSIS — R569 Unspecified convulsions: Secondary | ICD-10-CM | POA: Diagnosis not present

## 2021-01-26 DIAGNOSIS — R1310 Dysphagia, unspecified: Secondary | ICD-10-CM | POA: Diagnosis not present

## 2021-01-26 DIAGNOSIS — I608 Other nontraumatic subarachnoid hemorrhage: Secondary | ICD-10-CM | POA: Diagnosis not present

## 2021-01-26 DIAGNOSIS — R9431 Abnormal electrocardiogram [ECG] [EKG]: Secondary | ICD-10-CM | POA: Diagnosis not present

## 2021-01-26 LAB — COMPREHENSIVE METABOLIC PANEL
ALT: 9 U/L (ref 0–44)
AST: 25 U/L (ref 15–41)
Albumin: 1.7 g/dL — ABNORMAL LOW (ref 3.5–5.0)
Alkaline Phosphatase: 48 U/L (ref 38–126)
Anion gap: 6 (ref 5–15)
BUN: 11 mg/dL (ref 6–20)
CO2: 27 mmol/L (ref 22–32)
Calcium: 9.4 mg/dL (ref 8.9–10.3)
Chloride: 103 mmol/L (ref 98–111)
Creatinine, Ser: 0.41 mg/dL — ABNORMAL LOW (ref 0.44–1.00)
GFR, Estimated: >60 mL/min (ref >60)
Glucose, Bld: 151 mg/dL — ABNORMAL HIGH (ref 70–99)
Potassium: 3.1 mmol/L — ABNORMAL LOW (ref 3.5–5.1)
Sodium: 136 mmol/L (ref 135–145)
Total Bilirubin: 0.4 mg/dL (ref 0.3–1.2)
Total Protein: 5.3 g/dL — ABNORMAL LOW (ref 6.5–8.1)

## 2021-01-26 LAB — CBC
HCT: 35.5 % — ABNORMAL LOW (ref 36.0–46.0)
Hemoglobin: 11.3 g/dL — ABNORMAL LOW (ref 12.0–15.0)
MCH: 30 pg (ref 26.0–34.0)
MCHC: 31.8 g/dL (ref 30.0–36.0)
MCV: 94.2 fL (ref 80.0–100.0)
Platelets: 163 10*3/uL (ref 150–400)
RBC: 3.77 MIL/uL — ABNORMAL LOW (ref 3.87–5.11)
RDW: 16.4 % — ABNORMAL HIGH (ref 11.5–15.5)
WBC: 9.5 10*3/uL (ref 4.0–10.5)
nRBC: 0 % (ref 0.0–0.2)

## 2021-01-26 LAB — GLUCOSE, CAPILLARY
Glucose-Capillary: 102 mg/dL — ABNORMAL HIGH (ref 70–99)
Glucose-Capillary: 130 mg/dL — ABNORMAL HIGH (ref 70–99)
Glucose-Capillary: 155 mg/dL — ABNORMAL HIGH (ref 70–99)
Glucose-Capillary: 89 mg/dL (ref 70–99)

## 2021-01-26 LAB — LIPASE, BLOOD: Lipase: 326 U/L — ABNORMAL HIGH (ref 11–51)

## 2021-01-26 MED ORDER — OSMOLITE 1.2 CAL PO LIQD
1000.0000 mL | ORAL | Status: DC
  Administered 2021-01-26 – 2021-01-27 (×2): 1000 mL

## 2021-01-26 NOTE — Progress Notes (Signed)
PROGRESS NOTE    Patient: Denise Savage                            PCP: Denise Pander, MD                    DOB: Jul 06, 1960            DOA: 01/21/2021 QIO:[id number]             DOS: 01/26/2021, 6:55 PM   LOS: 5 days   Date of Service: The patient was seen and examined on 01/26/2021  Subjective:   -Resting comfortably, no fevers, nonverbal -Appears comfortable   Brief Narrative:   Denise Savage is a 60 y.o. 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  Patient was noted to develop nausea and vomiting this morning at her SNF, tube feeds were stopped, but she continued to have nausea and vomiting was sent to the ED.  She has never had pancreatitis before per report of her family.  Neurologically, her family reports that she has been making improvements since her ruptured aneurysm in January but is often nonverbal and when she does try to speak, cannot be understood.  Family suspects that she was in some pain today as they noted her blood pressure to be higher than normal.  Patient seemed more somnolent than usual today.    ED Course: Upon arrival to the ED, patient is found to be afebrile, saturating well on room air, and with stable blood pressure.  EKG features sinus rhythm with QTc interval of 635 ms.  Chemistry panel notable for elevated BUN to creatinine ratio.  CBC with mild polycythemia and slight thrombocytopenia.  Lactic acid normal.  Lipase 643.  Head CT negative for acute findings.  CT of the abdomen and pelvis concerning for acute pancreatitis without organized fluid collection or necrosis.  Patient was given IV fluids and Zofran in the ED.    Assessment & Plan:   Principal Problem:   Acute pancreatitis Active Problems:   Aneurysmal  subarachnoid hemorrhage (HCC)   Seizures (HCC)   Pulmonary embolus (HCC)   Dysphagia   Prolonged QT interval   Thickened endometrium    1. Acute pancreatitis /gastro duodenitis -No acute distress, not complaining of abdominal pain -Improved nausea vomiting... Continue as needed IV antiemetics - Lipase of 643 >>> 511 >>144 >>123>.213>>326 - CT findings consistent with acute pancreatitis as well as gastritis and duodenitis - LFTs normal, no biliary dilation on CT  -Right upper quadrant ultrasound without cholelithiasis or cholecystitis findings - Triglycerides 91  - continue to hold tube feeds,  Continue IV fluids, pain-control, supportive care   - LA 1.9  -Patient was restarted on tube feed on 01/24/2021, however lipase started to trend up,, clinical exam is challenging as patient is nonverbal, no emesis, abdomen does not appear distended--patient unable to report if she has abdominal pain or tenderness -Discussed with GI service--ideally we will not follow lipase but we have poor clinical ways to tell if patient's pancreatitis is worsening due to her somewhat vegetative state from a neuro psychiatric standpoint -We will reduce tube feeding with Osmolite to 20 mL an hour--titrate up very slowly - ProSource TF 45 ml BID per tube -Water  flushes 150 mL every 4 hours -Continue PPI  2. Prolonged QT interval  - QTc is 635 ms in ED  -Repeat EKG on 01/23/2021 with QTC less than 350 -Keep magnesium close to 2 and keep potassium close to 4   3. PE   - Admitted 11/18/20 with small RLL PE without heart strain and was discharged on Eliquis  - Continue Eliquis   - Stable    4. Seizures--status post prior craniectomy --very high risk for seizures, status post craniectomy, patient's Skull is currently in her left abdominal area, plan is to put her skull  back at a later date -PTA patient was on Vimpat, Keppra and Depakote -Continue antiepileptics IV for now while observing bowel rest for acute  pancreatitis   5. History of SAH; dysphagia; hemiparesis  -Nonverbal, bedbound   - Pt suffered ruptured intracranial aneurysm in January 2022  - She has ongoing dysphagia with PEG dependence, right hemiparesis, only occasionally tried to speak and is unintelligible  - Hold tube feeds for now, continue supportive care  - stable    6. Endometrial thickening and possible fibroid - Noted incidentally on CT, non-emergent [place] recommended  -Discussed with patient's son Ethelene Browns, outpatient follow-up advised   7) hypoglycemia due to n.p.o. status---  patient did receive D50 a from time to time --Anticipate blood sugars will improve once patient tolerating tube feeding better   8) hypercalcemia--- episodes of hypercalcemia from time to time usually improves with hydration  Disposition--- discharge back to SNF when tolerating tube feed well - ----------------------------------------------------------------------------------------------------------------------------------------------- Nutritional status:  The patient's BMI is: Body mass index is 39.43 kg/m. I agree with the assessment and plan as outlined below: Nutrition Status: Nutrition Problem: Inadequate oral intake Etiology: inability to eat Signs/Symptoms: NPO status Interventions: Tube feeding, Prostat ------------------------------------------------------------------------------------------------------------------------------------------------ Cultures / Antimicrobials: none    Consultants: None  ------------------------------------------------------------------------------------------------------------------------------------------------  DVT prophylaxis:  SCD/Compression stockings and NU:UVOZDGU Code Status:   Code Status: Full Code  Family Communication: Discussed with her daughters and son Ethelene Browns  Admission status:   Status is: Inpatient  Remains inpatient appropriate because:IV treatments appropriate due to intensity  of illness or inability to take PO and Inpatient level of care appropriate due to severity of illness  Dispo: The patient is from: SNF              Anticipated d/c is to: SNF 1-2 days - discharge back to SNF when tolerating tube feed well              Patient currently is not medically stable to d/c.   Difficult to place patient No    Level of care: Med-Surg   Procedures:   No admission procedures for hospital encounter.    Antimicrobials:  None    Medication:   apixaban  5 mg Per Tube BID   feeding supplement (OSMOLITE 1.2 CAL)  1,000 mL Per Tube Q24H   feeding supplement (PROSource TF)  45 mL Per Tube BID   free water  30 mL Per Tube Q4H   pantoprazole sodium  40 mg Per Tube Daily    acetaminophen **OR** acetaminophen, fentaNYL (SUBLIMAZE) injection   Objective:   Vitals:   01/25/21 2055 01/26/21 0500 01/26/21 0528 01/26/21 1507  BP: (!) 142/87  133/79 (!) 132/91  Pulse: (!) 110  (!) 103 94  Resp: 17  18 (!) 24  Temp: 98.2 F (36.8 C)  98.6 F (37 C) 98.3 F (36.8 C)  TempSrc:    Oral  SpO2:  99%  96% 99%  Weight:  104.2 kg    Height:        Intake/Output Summary (Last 24 hours) at 01/26/2021 1855 Last data filed at 01/26/2021 1700 Gross per 24 hour  Intake 4584.98 ml  Output 900 ml  Net 3684.98 ml   Filed Weights   01/24/21 0532 01/25/21 0500 01/26/21 0500  Weight: 100.4 kg 104.1 kg 104.2 kg     Examination:   Physical Exam  Constitution:  -nonverbal... Bedbound HEENT: Left side of patient's skull is Not present ,  Chest:Chest symmetric Cardio vascular:  S1/S2, RRR, No murmure, No Rubs or Gallops  pulmonary: Clear to auscultation bilaterally, respirations unlabored, negative wheezes / crackles Abdomen: Soft,  non-distended, bowel sounds,no masses, PEG tube site is clean dry intact, patient's skull is actually in her left abdominal area- Muscular skeletal: generalized weaknesses , bedbound Neuro: Limited exam -nonverbal, does not follow commands   extremities: +ve edema, +2 pulses  Skin: Dry, warm to touch, negative for any Rashes, No open wounds Wounds: per nursing documentation    ------------------------------------------------------------------------------------------------------------------------------------------    LABs:  CBC Latest Ref Rng & Units 01/26/2021 01/24/2021 01/23/2021  WBC 4.0 - 10.5 K/uL 9.5 20.8(H) 24.7(H)  Hemoglobin 12.0 - 15.0 g/dL 11.3(L) 12.5 15.0  Hematocrit 36.0 - 46.0 % 35.5(L) 40.2 49.3(H)  Platelets 150 - 400 K/uL 163 151 135(L)   CMP Latest Ref Rng & Units 01/26/2021 01/24/2021 01/23/2021  Glucose 70 - 99 mg/dL 353(G) 99(M) 96  BUN 6 - 20 mg/dL 11 18 42(A)  Creatinine 0.44 - 1.00 mg/dL 8.34(H) 9.62 2.29  Sodium 135 - 145 mmol/L 136 136 140  Potassium 3.5 - 5.1 mmol/L 3.1(L) 4.2 4.4  Chloride 98 - 111 mmol/L 103 106 105  CO2 22 - 32 mmol/L 27 23 25   Calcium 8.9 - 10.3 mg/dL 9.4 9.4  Total Protein 6.5 - 8.1 g/dL 5.3(L) - -  Total Bilirubin 0.3 - 1.2 mg/dL 0.4 - -  Alkaline Phos 38 - 126 U/L 48 - -  AST 15 - 41 U/L 25 - -  ALT 0 - 44 U/L 9 - -     Micro Results Recent Results (from the past 240 hour(s))  Resp Panel by RT-PCR (Flu A&B, Covid) Nasopharyngeal Swab     Status: None   Collection Time: 01/21/21  6:34 PM   Specimen: Nasopharyngeal Swab; Nasopharyngeal(NP) swabs in vial transport medium  Result Value Ref Range Status   SARS Coronavirus 2 by RT PCR NEGATIVE NEGATIVE Final    Comment: (NOTE) SARS-CoV-2 target nucleic acids are NOT DETECTED.  The SARS-CoV-2 RNA is generally detectable in upper respiratory specimens during the acute phase of infection. The lowest concentration of SARS-CoV-2 viral copies this assay can detect is 138 copies/mL. A negative result does not preclude SARS-Cov-2 infection and should not be used as the sole basis for treatment or other patient management decisions. A negative result may occur with  improper specimen collection/handling, submission of  specimen other than nasopharyngeal swab, presence of viral mutation(s) within the areas targeted by this assay, and inadequate number of viral copies(<138 copies/mL). A negative result must be combined with clinical observations, patient history, and epidemiological information. The expected result is Negative.  Fact Sheet for Patients:  03/23/21  Fact Sheet for Healthcare Providers:  BloggerCourse.com  This test is no t yet approved or cleared by the [place] FDA and  has been authorized for detection and/or diagnosis of SARS-CoV-2 by FDA under an Emergency Use  Authorization (EUA). This EUA will remain  in effect (meaning this test can be used) for the duration of the COVID-19 declaration under Section 564(b)(1) of the Act, 21 U.S.C.section 360bbb-3(b)(1), unless the authorization is terminated  or revoked sooner.       Influenza A by PCR NEGATIVE NEGATIVE Final   Influenza B by PCR NEGATIVE NEGATIVE Final    Comment: (NOTE) The Xpert Xpress SARS-CoV-2/FLU/RSV plus assay is intended as an aid in the diagnosis of influenza from Nasopharyngeal swab specimens and should not be used as a sole basis for treatment. Nasal washings and aspirates are unacceptable for Xpert Xpress SARS-CoV-2/FLU/RSV testing.  Fact Sheet for Patients: BloggerCourse.comhttps://www.fda.gov/media/152166/download  Fact Sheet for Healthcare Providers: SeriousBroker.ithttps://www.fda.gov/media/152162/download  This test is not yet approved or cleared by the [place] FDA and has been authorized for detection and/or diagnosis of SARS-CoV-2 by FDA under an Emergency Use Authorization (EUA). This EUA will remain in effect (meaning this test can be used) for the duration of the COVID-19 declaration under Section 564(b)(1) of the Act, 21 U.S.C. section 360bbb-3(b)(1), unless the authorization is terminated or revoked.  Performed at [facility], [address]  [place], [place] [address]     Radiology Reports CT Head Wo Contrast  Result Date: 01/21/2021 CLINICAL DATA:  Mental status change, unknown cause EXAM: CT HEAD WITHOUT CONTRAST TECHNIQUE: Contiguous axial images were obtained from the base of the skull through the vertex without intravenous contrast. 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 Ex vacuo dilatation of the left lateral ventricle. No acute infarct or hemorrhage. Vascular: Embolization coils seen in the left middle cerebral artery. No hyperdense vessel. Skull: Prior large left craniectomy. Sinuses/Orbits: No acute findings Other: None IMPRESSION: Prior left craniectomy with large area of encephalomalacia throughout much of the left cerebral hemisphere. Ex vacuo dilatation of the left lateral ventricle. No acute intracranial abnormality. Electronically Signed   By: Charlett NoseKevin  Dover M.D.   On: 01/21/2021 19:38   CT Abdomen Pelvis W Contrast  Result Date: 01/21/2021 CLINICAL DATA:  Vomiting peg tube EXAM: CT ABDOMEN AND PELVIS WITH CONTRAST TECHNIQUE: Multidetector CT imaging of the abdomen and pelvis was performed using the standard protocol following bolus administration of intravenous contrast. CONTRAST:  85mL OMNIPAQUE IOHEXOL 350 MG/ML SOLN COMPARISON:  None. FINDINGS: Lower chest: Lung bases demonstrate partial lower lobe consolidation which may reflect atelectasis or aspiration. No pleural effusion. Cardiomegaly. Hepatobiliary: Subcentimeter hypodense lesion posterior right hepatic lobe too small to further characterize. No calcified gallstone or biliary dilatation Pancreas: Pancreatic enlargement with marked peripancreatic inflammatory change and fluid consistent with acute pancreatitis. No organized fluid collections. Homogeneous enhancement. Spleen: Normal in size without focal abnormality. Adrenals/Urinary Tract: Adrenal glands are normal. Cyst lower pole left  kidney. Small bilateral kidney stones. The bladder is thick walled and indistinct. Stomach/Bowel: Stomach nonenlarged. Mild wall thickening and mucosal enhancement of distal stomach and first portion of duodenum. No dilated small bowel. Hyperdense material in the colon. No acute bowel wall thickening. Gastrostomy tube in the distal body of the stomach. Vascular/Lymphatic: Nonaneurysmal aorta with mild atherosclerosis. No suspicious lymph nodes. Portal and splenic veins normally enhance. Reproductive: Prominent size uterus. Endometrial stripe slightly thickened up to 11 mm with focal hypodense area in the posterior lower uterine segment questionable for fibroid. No adnexal mass Other: No free air. Small free fluid in the abdomen and pelvis. 11 by 12 by 9.4 cm suspected foreign body within the subcutaneous fat of the  left anterior abdominal wall with small adjacent fluid. This is seen at the level of the umbilicus. Evidence of prior ventral hernia repair with small fat containing recurrent ventral hernia. Musculoskeletal: No acute osseous abnormality IMPRESSION: 1. Findings consistent with acute pancreatitis. No organized fluid collection or evidence for necrosis at this time. Small amount of abdominopelvic ascites 2. Gastrostomy tube present in the distal body of stomach. Mild wall thickening and mucosal enhancement of distal stomach and first portion of duodenum likely reflecting gastritis/duodenitis versus reactive inflammation from adjacent inflamed pancreas 3. 11 x 12 x 9.4 cm suspected foreign body within the subcutaneous fat of the left anterior body wall at the level of the umbilicus. Small amount of fluid surrounding the foreign body. 4. Cardiomegaly. Patchy atelectasis or aspiration changes at the bases. 5. Thickened endometrial stripe with slightly enlarged appearing uterus for age. Possible hypoechoic mass within the posterior lower uterine segment. Recommend correlation with nonemergent ultrasound.  Electronically Signed   By: Jasmine Pang M.D.   On: 01/21/2021 19:47   DG Chest Port 1 View  Result Date: 01/21/2021 CLINICAL DATA:  Altered mental status. EXAM: PORTABLE CHEST 1 VIEW COMPARISON:  July 15, 2020. FINDINGS: Stable cardiomediastinal silhouette. Tracheostomy tube has been removed. Both lungs are clear. The visualized skeletal structures are unremarkable. IMPRESSION: No active disease. Electronically Signed   By: Lupita Raider M.D.   On: 01/21/2021 16:27   US Abdomen Limited RUQ (LIVER/GB)  Result Date: 01/22/2021 CLINICAL DATA:  Acute pancreatitis EXAM: ULTRASOUND ABDOMEN LIMITED RIGHT UPPER QUADRANT COMPARISON:  CT abdomen 01/21/2021 FINDINGS: Gallbladder: Gallbladder wall is within normal limits at 0.2 cm. No gallstones or sonographic Murphy sign identified. Common bile duct: Diameter: 0.3 cm Liver: Mildly coarsened echotexture, without substantial focal lesion identified. Portal vein is patent on color Doppler imaging with normal direction of blood flow towards the liver. Other: Trace perihepatic ascites. IMPRESSION: 1. No findings of gallstones or biliary dilatation. 2. Trace perihepatic ascites. 3. Subtle coarsening of the echotexture of the liver is nonspecific but can be seen in a variety of conditions including hepatitis, metabolic abnormalities, cirrhosis, and hemochromatosis. Electronically Signed   By: Gaylyn Rong M.D.   On: 01/22/2021 10:46    SIGNED: Shon Hale, MD,  [facility],  Pager (please use [email or web address] to page/text) Please use Epic Secure Chat for non-urgent communication (7AM-7PM)  If 7PM-7AM, please contact night-coverage [email or web address], 01/26/2021, 6:55 PM

## 2021-01-26 NOTE — Plan of Care (Signed)
  Problem: Education: Goal: Knowledge of General Education information will improve Description: Including pain rating scale, medication(s)/side effects and non-pharmacologic comfort measures Outcome: Not Progressing   Problem: Health Behavior/Discharge Planning: Goal: Ability to manage health-related needs will improve Outcome: Not Progressing   Problem: Clinical Measurements: Goal: Ability to maintain clinical measurements within normal limits will improve Outcome: Not Progressing Goal: Will remain free from infection Outcome: Progressing Goal: Diagnostic test results will improve Outcome: Progressing Goal: Respiratory complications will improve Outcome: Progressing Goal: Cardiovascular complication will be avoided Outcome: Progressing   Problem: Activity: Goal: Risk for activity intolerance will decrease Outcome: Not Progressing   Problem: Nutrition: Goal: Adequate nutrition will be maintained Outcome: Not Progressing   Problem: Coping: Goal: Level of anxiety will decrease Outcome: Progressing   Problem: Elimination: Goal: Will not experience complications related to bowel motility Outcome: Progressing Goal: Will not experience complications related to urinary retention Outcome: Progressing   Problem: Pain Managment: Goal: General experience of comfort will improve Outcome: Progressing   Problem: Safety: Goal: Ability to remain free from injury will improve Outcome: Progressing   Problem: Skin Integrity: Goal: Risk for impaired skin integrity will decrease Outcome: Not Progressing

## 2021-01-26 NOTE — Progress Notes (Signed)
Initial Nutrition Assessment  DOCUMENTATION CODES:   Obesity unspecified  INTERVENTION:  Osmolite 1.2 @ 20 ml/hr via PEG tube currently.   ProSource TF 45 ml BID per tube  Free water 150 ml q 4 hrs -if no IV fluids.  NUTRITION DIAGNOSIS:   Inadequate oral intake related to inability to eat as evidenced by NPO status.  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  Patient was noted to develop nausea and vomiting this morning at her SNF, tube feeds were stopped, but she continued to have nausea and vomiting was sent to the ED.  She has never had pancreatitis before per report of her family.  Neurologically, her family reports that she has been making improvements since her ruptured aneurysm in January but is often nonverbal and when she does try to speak, cannot be understood.  Family suspects that she was in some pain today as they noted her blood pressure to be higher than normal.  Patient seemed more somnolent than usual today.  8/15 Patient discussed during rounds. Cleared per MD to continue advancing to goal rate. RD to adjust. Discussed with nursing- patient tolerating nutrition at current rate. Hypoglycemia this morning. D5% being resumed. Last BM unknown??  8/16 Talked with nursing bedside. Tube feeding titrated down around 1000 this morning to 20 ml/hr due to increased lipase. Providing 576 kcal, 27 gr protein and 394 ml water. Meeting 30% energy, 26% protein needs. Question if pt may be candidate for Creon via PEG?    Medications reviewed and include: Reviewed.    Labs: BMP Latest Ref Rng & Units 01/26/2021 01/24/2021 01/23/2021  Glucose 70 - 99 mg/dL 102(H) 85(I) 96  BUN 6 - 20 mg/dL 11 18 77(O)  Creatinine 0.44 - 1.00 mg/dL 2.42(P) 5.36 1.44  Sodium 135 - 145 mmol/L 136 136 140  Potassium 3.5 - 5.1 mmol/L 3.1(L) 4.2 4.4  Chloride 98 - 111 mmol/L 103 106 105  CO2 22 - 32 mmol/L 27 23 25   Calcium 8.9 - 10.3 mg/dL 9.4 9.4      Diet Order:   Diet Order             Diet NPO time specified  Diet effective now                   EDUCATION NEEDS:   Not appropriate for education at this time  Skin:  Skin Assessment: Reviewed RN Assessment (PEG tube in place)  Last BM:  8/15 type 6 x 2  Height:   Ht Readings from Last 1 Encounters:  01/21/21 5\' 4"  (1.626 m)    Weight:   Wt Readings from Last 1 Encounters:  01/26/21 104.2 kg    Ideal Body Weight:   55 kg  BMI:  Body mass index is 39.43 kg/m.  Estimated Nutritional Needs:   Kcal:  1900-2100  Protein:  105-120 grams  Fluid:  >1.9 L   MS,RD,CSG,LDN Contact: AMION

## 2021-01-26 NOTE — Consult Note (Signed)
@LOGO @   Referring Provider: [facility] hospitalist Primary Care Physician:  , MD Primary Gastroenterologist:  Dr. Charlynne Pander, previously unassigned  Date of Admission: 01/21/2021 Date of Consultation: 01/26/2021  Reason for Consultation: Elevated lipase, pancreatitis.   HPI:  Denise Savage is a 60 y.o. 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   ED Course: Upon arrival to the ED, patient is found to be afebrile, saturating well on room air, and with stable blood pressure.  EKG features sinus rhythm with QTc interval of 635 ms.  Chemistry panel with elevated BUN to creatinine ratio (BUN 21, creatinine 0.68), calcium elevated at 10.6, albumin low at 2.7, glucose mildly elevated at 118, otherwise within normal limits.  CBC with hemoglobin 15.5, platelets low at 140, WBC normal at 10.2.  Lipase elevated at 643.  Lactic acid 1.9.  Head CT negative for acute findings.   CT of the abdomen and pelvis concerning for acute pancreatitis without organized fluid collection or necrosis.  Also with mild wall thickening and mucosal enhancement of distal stomach and first portion of duodenum likely reflecting gastritis/duodenitis versus reactive inflammation from pancreatitis.  Small amount of free fluid in the abdomen and pelvis. She was started on IV fluids and Zofran.  Subsequently, she had right upper quadrant ultrasound without cholelithiasis or cholecystitis.  Triglycerides 91. Tube feeds were initially held.  Lipase was trending down.  Tube feeds were restarted on 8/14.  Noted increasing lipase up to 213 on 8/15 and 326 on 8/16.  Thus, GI was consulted.  Today:  Patient is unable to provide any history today.  She  is nonverbal.  Spoke with nursing staff who states patient has seemed "fine".  No reported nausea or vomiting in the last 24 hours.  She did have 2 documented bowel movements yesterday.  No reports of BRBPR or melena.  Tube feeds were decreased today to 20 cc/h.  Attempted to call patient's daughter to get additional information, but I was unable to reach her.  Spoke with nurse at 9/16. Admitted on 6/21. No nausea/vomiting typically. Acute onset vomiting day she was sent to the ED. No hematemesis. No brpr or melena. No NSAIDs. Not on a PPI outpatient. No known history of GERD.    EGD January 2022: Normal esophagus, normal stomach, normal examined duodenum.  Past Medical History:  Diagnosis Date   Abdominal hernia    Dysphagia    Gastrostomy status (HCC)    Hemiplegia and hemiparesis following cerebral infarction affecting right dominant side (HCC)    Pulmonary embolus (HCC)    Seizures (HCC)     Past Surgical History:  Procedure Laterality Date   CRANIOTOMY Left 06/19/2020   Procedure: CRANIOTOMY HEMATOMA EVACUATION SUBDURAL;  Surgeon: 08/17/2020, MD;  Location: [facility] OR;  Service: Neurosurgery;  Laterality: Left;   ESOPHAGOGASTRODUODENOSCOPY N/A 07/01/2020   Procedure: ESOPHAGOGASTRODUODENOSCOPY (EGD);  Surgeon: 07/03/2020, MD;  Location: [facility] ENDOSCOPY;  Service: General;  Laterality: N/A;   IR ANGIO INTRA EXTRACRAN SEL INTERNAL CAROTID BILAT MOD SED  06/16/2020   IR ANGIO VERTEBRAL SEL VERTEBRAL UNI R MOD SED  06/16/2020   IR ANGIOGRAM FOLLOW UP STUDY  06/16/2020   IR ANGIOGRAM FOLLOW UP STUDY  06/16/2020   IR ANGIOGRAM FOLLOW UP STUDY  06/16/2020   IR CT HEAD LTD  06/16/2020   IR NEURO EACH ADD'L AFTER BASIC UNI RIGHT (MS)  06/16/2020   IR TRANSCATH/EMBOLIZ  06/16/2020   IR US GUIDE VASC ACCESS RIGHT  06/16/2020   PEG PLACEMENT N/A 07/01/2020   Procedure: PERCUTANEOUS ENDOSCOPIC GASTROSTOMY (PEG) PLACEMENT;  Surgeon: Diamantina Monks, MD;  Location: [facility] ENDOSCOPY;  Service: General;   Laterality: N/A;   RADIOLOGY WITH ANESTHESIA N/A 06/16/2020   Procedure: IR WITH ANESTHESIA;  Surgeon: Lisbeth Renshaw, MD;  Location: [facility] OR;  Service: Radiology;  Laterality: N/A;   TUBAL LIGATION     VENTRAL HERNIA REPAIR N/A 12/29/2014   Procedure: LAPAROSCOPIC VENTRAL HERNIA WITH MESH;  Surgeon: Franky Macho, MD;  Location: [facility] ORS;  Service: General;  Laterality: N/A;    Prior to Admission medications   Medication Sig Start Date End Date Taking? Authorizing Provider  ELIQUIS 5 MG TABS tablet Take 5 mg by mouth 2 (two) times daily. 01/06/21  Yes [provider]  HYDROcodone-acetaminophen (NORCO) 7.5-325 MG tablet Place 1 tablet into feeding tube every 8 (eight) hours as needed for moderate pain.   Yes [provider]  lacosamide (VIMPAT) 10 MG/ML oral solution Take 5 mLs (50 mg total) by mouth 2 (two) times daily. Patient taking differently: Take 50 mg by mouth 2 (two) times daily. Peg-tube 09/08/20  Yes Costella, Darci Current, PA-C  levETIRAcetam (KEPPRA) 100 MG/ML solution Place 15 mLs (1,500 mg total) into feeding tube 2 (two) times daily. Patient taking differently: Place 1,500 mg into feeding tube 2 (two) times daily. 900 & 2100 09/08/20  Yes Costella, Darci Current, PA-C  losartan (COZAAR) 25 MG tablet Place 25 mg into feeding tube daily. 01/08/21  Yes [provider]  valproic acid (DEPAKENE) 250 MG/5ML solution Place 15 mLs (750 mg total) into feeding tube every 8 (eight) hours. Patient taking differently: Place 750 mg into feeding tube every 6 (six) hours. 09/08/20  Yes Costella, Darci Current, PA-C  levETIRAcetam (KEPPRA) 500 MG/5ML injection     [provider]  tamsulosin (FLOMAX) 0.4 MG CAPS capsule Take 0.4 mg by mouth daily. Patient not taking: No sig reported 12/25/20   [provider]    Current Facility-Administered Medications  Medication Dose Route Frequency Provider Last Rate Last Admin   acetaminophen (TYLENOL) tablet 650 mg  650 mg Per  Tube Q6H PRN Opyd, Lavone Neri, MD       Or   acetaminophen (TYLENOL) suppository 650 mg  650 mg Rectal Q6H PRN Opyd, Lavone Neri, MD       apixaban (ELIQUIS) tablet 5 mg  5 mg Per Tube BID Opyd, Lavone Neri, MD   5 mg at 01/26/21 1013   dextrose 5 %-0.45 % sodium chloride infusion   Intravenous Continuous Emokpae, Courage, MD 100 mL/hr at 01/26/21 1042 New Bag at 01/26/21 1042   feeding supplement (OSMOLITE 1.2 CAL) liquid 1,000 mL  1,000 mL Per Tube Q24H Emokpae, Courage, MD 55 mL/hr at 01/26/21 0649 1,000 mL at 01/26/21 0649   feeding supplement (PROSource TF) liquid 45 mL  45 mL Per Tube BID Emokpae, Courage, MD   45 mL at 01/26/21 1013   fentaNYL (SUBLIMAZE) injection 12.5-50 mcg  12.5-50 mcg Intravenous Q2H PRN Opyd, Lavone Neri, MD       free water 30 mL  30 mL Per Tube Q4H Emokpae, Courage, MD   30 mL at 01/26/21 0800   lacosamide (VIMPAT) 50 mg in sodium chloride 0.9 % 25 mL  IVPB  50 mg Intravenous Q12H Opyd, Lavone Neri, MD 60 mL/hr at 01/26/21 0040 50 mg at 01/26/21 0040   levETIRAcetam (KEPPRA) IVPB 1500 mg/ 100 mL premix  1,500 mg Intravenous Q12H Briscoe Deutscher, MD 400 mL/hr at 01/26/21 0233 1,500 mg at 01/26/21 0233   pantoprazole sodium (PROTONIX) 40 mg/20 mL oral suspension 40 mg  40 mg Per Tube Daily Emokpae, Courage, MD   40 mg at 01/26/21 1013   valproate (DEPACON) 750 mg in dextrose 5 % 50 mL IVPB  750 mg Intravenous Q8H Opyd, Lavone Neri, MD 57.5 mL/hr at 01/26/21 0639 750 mg at 01/26/21 0639    Allergies as of 01/21/2021 - Review Complete 01/21/2021  Allergen Reaction Noted   Hydrocodone Nausea Only 12/22/2014    Family History  Problem Relation Age of Onset   Pancreatitis Neg Hx     Social History   Socioeconomic History   Marital status: Single    Spouse name: Not on file   Number of children: Not on file   Years of education: Not on file   Highest education level: Not on file  Occupational History   Not on file  Tobacco Use   Smoking status: Never   Smokeless  tobacco: Never  Vaping Use   Vaping Use: Never used  Substance and Sexual Activity   Alcohol use: No   Drug use: No   Sexual activity: Not on file  Other Topics Concern   Not on file  Social History Narrative   Not on file   Social Determinants of Health   Financial Resource Strain: Not on file  Food Insecurity: Not on file  Transportation Needs: Not on file  Physical Activity: Not on file  Stress: Not on file  Social Connections: Not on file  Intimate Partner Violence: Not on file    Review of Systems: Unable to assess due to patient being nonverbal.  Physical Exam: Vital signs in last 24 hours: Temp:  [97.4 F (36.3 C)-98.6 F (37 C)] 98.6 F (37 C) (08/16 0528) Pulse Rate:  [102-110] 103 (08/16 0528) Resp:  [17-20] 18 (08/16 0528) BP: (121-142)/(69-87) 133/79 (08/16 0528) SpO2:  [96 %-100 %] 96 % (08/16 0528) Weight:  [104.2 kg] 104.2 kg (08/16 0500)   General:   Resting comfortably in bed, no acute distress, opens her eyes, but does not make eye contact.   Head:  Normocephalic and atraumatic. Eyes:  Sclera clear, no icterus.   Conjunctiva pink. Lungs:  Clear throughout to auscultation anteriorly.   No wheezes, crackles, or rhonchi. No acute distress. Heart:  Regular rate and rhythm; no murmurs, clicks, rubs,  or gallops. Abdomen: Normoactive bowel sounds. Obese abdomen, soft, no appreciable tenderness palpation.  Scar in the left abdomen with associated firmness in this area. No masses, hepatosplenomegaly or hernias noted.  Rectal:  Deferred  Msk:  Symmetrical without gross deformities. Normal posture. Extremities:  With 2+ pitting edema up to knees.  Also with pitting edema in her arms bilaterally. Neurologic:  Alert.  Unable to assess orientation due to patient being nonverbal. Skin:  Intact without significant lesions or rashes. Psych:  Flat affect.   Intake/Output from previous day: 08/15 0701 - 08/16 0700 In: 3222.4 [I.V.:2378.1; NG/GT:300; IV  Piggyback:544.2] Out: 1000 [Urine:1000] Intake/Output this shift: No intake/output data recorded.  Lab Results: Recent Labs    01/24/21 0439 01/26/21 0336  WBC 20.8* 9.5  HGB 12.5 11.3*  HCT 40.2 35.5*  PLT 151 163   BMET Recent  Labs    01/24/21 0439 01/26/21 0336  NA 136 136  K 4.2 3.1*  CL 106 103  CO2 23 27  GLUCOSE 67* 151*  BUN 18 11  CREATININE 0.52 0.41*  CALCIUM 9.4 9.4   LFT Recent Labs    01/26/21 0336  PROT 5.3*  ALBUMIN 1.7*  AST 25  ALT 9  ALKPHOS 48  BILITOT 0.4    Impression: 60 y.o. 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  Acute pancreatitis: Lipase 643 on admission, CT with acute pancreatitis without organized fluid collections or necrosis.  Etiology unclear.  No known history of pancreatitis.  Ultrasound this admission without gallstones.  Triglycerides 91.  Calcium was elevated at 10.6 on admission, but this may be due to some dehydration as this is now normalized.  Can't rule out medication induced pancreatitis in the setting of valproic acid and losartan. Valproate has been continued this admission as she is very high risk for seizures.  Clinically, there is been some improvement as vomiting has resolved and tube feeds were restarted on 8/13. Lipase was trended and improved to 123 on 8/14.  However, lipase increased to 213 on 8/15, and 326 today.  Due to increasing lipase, tube feeds were decreased to 20 cc/h today. Unfortunately, patient is nonverbal and unable to provide any information. On exam, her abdomen is soft and non-distended. No grimacing during deep palpation to suggest pain.    Clinically, patient seems to be improving. Lipase is not a reliable predictor of improvement. Would recommend following clinically for now. Continue supportive measures. Ok to continue tube feels at a low rate for now.   Gastric and duodenal wall thickening: Mild wall thickening and mucosal enhancement of distal stomach and first portion of duodenum noted on CT.  Suspect this is likely reactive to inflammation from pancreatitis.  Recent EGD January 2022 normal esophagus, normal stomach, normal examined duodenum.   Plan: Continue supportive measures. May need to consider decreasing IV fluids in the setting of pitting edema in arms and legs.  Monitor for recurrent vomiting. Would not recheck lipase. May continue tube feeds at a low dose.  Continue PPI daily.    LOS: 5 days    01/26/2021, 12:41 PM   Ermalinda Memos, [facility]

## 2021-01-27 DIAGNOSIS — K859 Acute pancreatitis without necrosis or infection, unspecified: Secondary | ICD-10-CM | POA: Diagnosis not present

## 2021-01-27 DIAGNOSIS — R569 Unspecified convulsions: Secondary | ICD-10-CM | POA: Diagnosis not present

## 2021-01-27 DIAGNOSIS — R9389 Abnormal findings on diagnostic imaging of other specified body structures: Secondary | ICD-10-CM | POA: Diagnosis not present

## 2021-01-27 DIAGNOSIS — R1310 Dysphagia, unspecified: Secondary | ICD-10-CM | POA: Diagnosis not present

## 2021-01-27 DIAGNOSIS — R9431 Abnormal electrocardiogram [ECG] [EKG]: Secondary | ICD-10-CM | POA: Diagnosis not present

## 2021-01-27 DIAGNOSIS — I2699 Other pulmonary embolism without acute cor pulmonale: Secondary | ICD-10-CM | POA: Diagnosis not present

## 2021-01-27 DIAGNOSIS — I608 Other nontraumatic subarachnoid hemorrhage: Secondary | ICD-10-CM | POA: Diagnosis not present

## 2021-01-27 DIAGNOSIS — K853 Drug induced acute pancreatitis without necrosis or infection: Secondary | ICD-10-CM | POA: Diagnosis not present

## 2021-01-27 LAB — GLUCOSE, CAPILLARY
Glucose-Capillary: 99 mg/dL (ref 70–99)
Glucose-Capillary: 86 mg/dL (ref 70–99)
Glucose-Capillary: 110 mg/dL — ABNORMAL HIGH (ref 70–99)
Glucose-Capillary: 81 mg/dL (ref 70–99)
Glucose-Capillary: 114 mg/dL — ABNORMAL HIGH (ref 70–99)

## 2021-01-27 LAB — COMPREHENSIVE METABOLIC PANEL
ALT: 10 U/L (ref 0–44)
AST: 34 U/L (ref 15–41)
Albumin: 1.7 g/dL — ABNORMAL LOW (ref 3.5–5.0)
Alkaline Phosphatase: 47 U/L (ref 38–126)
Anion gap: 4 — ABNORMAL LOW (ref 5–15)
BUN: 10 mg/dL (ref 6–20)
CO2: 30 mmol/L (ref 22–32)
Calcium: 9.5 mg/dL (ref 8.9–10.3)
Chloride: 104 mmol/L (ref 98–111)
Creatinine, Ser: 0.37 mg/dL — ABNORMAL LOW (ref 0.44–1.00)
GFR, Estimated: >60 mL/min (ref >60)
Glucose, Bld: 108 mg/dL — ABNORMAL HIGH (ref 70–99)
Potassium: 3.4 mmol/L — ABNORMAL LOW (ref 3.5–5.1)
Sodium: 138 mmol/L (ref 135–145)
Total Bilirubin: 0.5 mg/dL (ref 0.3–1.2)
Total Protein: 5.3 g/dL — ABNORMAL LOW (ref 6.5–8.1)

## 2021-01-27 MED ORDER — LACOSAMIDE 200 MG/20ML IV SOLN
INTRAVENOUS | Status: AC
  Filled 2021-01-27: qty 20

## 2021-01-27 MED ORDER — LEVETIRACETAM IN NACL 1000 MG/100ML IV SOLN
1000.0000 mg | Freq: Two times a day (BID) | INTRAVENOUS | Status: DC
  Administered 2021-01-27 – 2021-01-29 (×4): 1000 mg via INTRAVENOUS
  Filled 2021-01-27 (×4): qty 100

## 2021-01-27 MED ORDER — POTASSIUM CHLORIDE 10 MEQ/100ML IV SOLN
10.0000 meq | INTRAVENOUS | Status: AC
  Administered 2021-01-27 (×2): 10 meq via INTRAVENOUS
  Filled 2021-01-27: qty 100

## 2021-01-27 MED ORDER — VALPROATE SODIUM 100 MG/ML IV SOLN
500.0000 mg | Freq: Two times a day (BID) | INTRAVENOUS | Status: AC
  Administered 2021-01-28 – 2021-01-29 (×4): 500 mg via INTRAVENOUS
  Filled 2021-01-27 (×4): qty 5

## 2021-01-27 MED ORDER — LACOSAMIDE 200 MG/20ML IV SOLN
100.0000 mg | Freq: Two times a day (BID) | INTRAVENOUS | Status: AC
  Administered 2021-01-28 – 2021-01-29 (×4): 100 mg via INTRAVENOUS
  Filled 2021-01-27 (×10): qty 10

## 2021-01-27 MED ORDER — POTASSIUM CHLORIDE 10 MEQ/100ML IV SOLN
10.0000 meq | INTRAVENOUS | Status: AC
  Administered 2021-01-28 (×2): 10 meq via INTRAVENOUS
  Filled 2021-01-27 (×2): qty 100

## 2021-01-27 MED ORDER — FUROSEMIDE 10 MG/ML IJ SOLN
40.0000 mg | Freq: Every day | INTRAMUSCULAR | Status: AC
  Administered 2021-01-27 – 2021-01-28 (×2): 40 mg via INTRAVENOUS
  Filled 2021-01-27 (×2): qty 4

## 2021-01-27 MED ORDER — PROSOURCE TF PO LIQD
90.0000 mL | Freq: Four times a day (QID) | ORAL | Status: DC
  Administered 2021-01-27 – 2021-02-02 (×24): 90 mL
  Filled 2021-01-27 (×25): qty 90

## 2021-01-27 NOTE — Progress Notes (Signed)
Subjective: Patient is non verbal, therefore unable to obtain any subjective history.   Objective: Vital signs in last 24 hours: Temp:  [98.3 F (36.8 C)-98.9 F (37.2 C)] 98.3 F (36.8 C) (08/17 0541) Pulse Rate:  [93-96] 93 (08/17 0541) Resp:  [16-24] 16 (08/17 0541) BP: (130-142)/(87-91) 142/87 (08/17 0541) SpO2:  [98 %-100 %] 98 % (08/17 0541) Weight:  [107.2 kg] 107.2 kg (08/17 0500) Last BM Date: 01/25/21 General:   Alert, calm. Head:  Normocephalic and atraumatic. Eyes:  No icterus, sclera clear. Conjuctiva pink. Marland Kitchen  Heart:  S1, S2 present, no murmurs noted.  Lungs: Clear to auscultation bilaterally, without wheezing, rales, or rhonchi.  Abdomen:  Bowel sounds present, soft, non-tender, non-distended. No HSM or hernias noted. No rebound or guarding. No masses appreciated. Scar to Left abdomen with associated firmness in this area. Rectal: deferred Msk:  Symmetrical without gross deformities. Normal posture. Pulses:  Normal pulses noted. Extremities:  2+ pitting edema up to knees, bilateral pitting edema in arms. Neurologic:  Alert and  oriented x4;  grossly normal neurologically. Skin:  Warm and dry, intact without significant lesions.  Psych:  alert, non verbal.  Intake/Output from previous day: 08/16 0701 - 08/17 0700 In: 3408.9 [I.V.:1172.7; NG/GT:1990.1; IV Piggyback:246.1] Out: 200 [Urine:200] Intake/Output this shift: No intake/output data recorded.  Lab Results: Recent Labs    01/26/21 0336  WBC 9.5  HGB 11.3*  HCT 35.5*  PLT 163   BMET Recent Labs    01/26/21 0336 01/27/21 0806  NA 136 138  K 3.1* 3.4*  CL 103 104  CO2 27 30  GLUCOSE 151* 108*  BUN 11 10  CREATININE 0.41* 0.37*  CALCIUM 9.4 9.5   LFT Recent Labs    01/26/21 0336 01/27/21 0806  PROT 5.3* 5.3*  ALBUMIN 1.7* 1.7*  AST 25 34  ALT 9 10  ALKPHOS 48 47  BILITOT 0.4 0.5    Assessment: 60 year old female with pmh significant for ruptured intracranial aneurysm in January  with residual hemiparesis, asphasia, and dysphagia with PEG tube dependence, seizure disorder and PE in June, now on Eliquis, presented to the ED on 8/11 for evaluation of acute onset of nausea and vomiting, found to have elevated lipse and documented acute pancreatitis on CT abdomen.  Acute Pancreatitis: lipase was 643 on admission, CT with acute pancreatitis without organized fluid collection or necrosis. Patient also found to have gastric and duodenal wall thickening with mucosal enhancement of stomach and first portion of duodenum as revealed on CT. Suspect this is likely related to reactive inflammation from pancreatitis. Recent EGD January 2022 was normal. No known history of pancreatitis and [place] on this admission without gallstones. Triglycerides 91, not significant for inducing pancreatitis. Calcium was elevated to 10.6 initially but has normalized. Etiology of pancreatitis unclear, however, could be related to valproic acid or losartan.   Valproic acid has been continued during admission due to patient's high risk of seizure occurrence, however, it would be important to discuss possible options of other anti seizure medications with neurology given this could have played a role in the development of patient's pancreatitis.   Vomiting has resolved, tube feedings were restarted on 8/13. Lipase was trended and improved to 123 on 8/14 then increased again to 213 on 8/15 and 326 yesterday. Tube feeds were decreased to 20 cc/h yesterday due to rising lipase. Would recommend following pancreatitis from clinical standpoint and discontinue trending lipase as this is not a reliable correlation for severity of illness.  Patient is unfortunately non verbal and unable to provide any clinical history or subjective report. BP was slightly elevated this morning which could be from underlying pain, however, she does not appear to be in any pain upon physical assessment, no moaning or grimacing during abdominal exam.    GI following clinically, however, with continued improvement, we will likely sign off for patient to return back to SNF.  Plan: Continue supportive measures Monitor for recurrent vomiting Avoid trending lipase due to lack of clinical correlation Continue tube feeds at low dose as tolerated Continue PPI daily Consider consulting neuro in regards to changing valproic acid as this can induce pancreatitis   LOS: 6 days    01/27/2021, 9:45 AM   Dreana Britz L. Jeanmarie Hubert, MSN, APRN, AGNP-C Adult-Gerontology Nurse Practitioner [facility]

## 2021-01-27 NOTE — Progress Notes (Signed)
Initial Nutrition Assessment  DOCUMENTATION CODES:   Obesity unspecified  INTERVENTION:  Osmolite 1.2 @ 20 ml/hr via PEG tube currently. Recommend consider advancing to 30 ml/hr. If pt remains unable to meet nutrition needs - trial of elemental formula may be better tolerated.  Increase ProSource TF 90 ml QID per tube (provides 320 kcal, 88 gr protein).  Free water 150 ml q 4 hrs -if no IV fluids.  NUTRITION DIAGNOSIS:   Inadequate oral intake related to inability to eat as evidenced by NPO status.  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  Patient was noted to develop nausea and vomiting this morning at her SNF, tube feeds were stopped, but she continued to have nausea and vomiting was sent to the ED.  She has never had pancreatitis before per report of her family.  Neurologically, her family reports that she has been making improvements since her ruptured aneurysm in January but is often nonverbal and when she does try to speak, cannot be understood.  Family suspects that she was in some pain today as they noted her blood pressure to be higher than normal.  Patient seemed more somnolent than usual today.  8/15 Patient discussed during rounds. Cleared per MD to continue advancing to goal rate. RD to adjust. Discussed with nursing- patient tolerating nutrition at current rate. Hypoglycemia this morning. D5% being resumed. Last BM unknown??  8/16 Talked with nursing bedside. Tube feeding titrated down around 1000 this morning to 20 ml/hr due to increased lipase. Providing 576  kcal, 27 gr protein and 394 ml water. Meeting 30% energy, 26% protein needs. Question if pt may be candidate for Creon via PEG?    8/17 Patient receiving tube feeding at 20 ml/hr and protein modular BID. D5% supporting fluid intake and glucose stability. Recommend advance feeding due to her hypocaloric, limited protein intake. Will increase protein modular to QID.   Medications reviewed and include: Reviewed.   Labs: BMP Latest Ref Rng & Units 01/27/2021 01/26/2021 01/24/2021  Glucose 70 - 99 mg/dL 720(N) 470(J) 62(E)  BUN 6 - 20 mg/dL 10 11 18   Creatinine 0.44 - 1.00 mg/dL ) 3.66(Q) 9.47(M  Sodium 135 - 145 mmol/L 138 136 136  Potassium 3.5 - 5.1 mmol/L 3.4(L) 3.1(L) 4.2  Chloride 98 - 111 mmol/L 104 103 106  CO2 22 - 32 mmol/L 30 27 23   Calcium 8.9 - 10.3 mg/dL 9.5 9.4 9.4      Diet Order:   Diet Order             Diet NPO time specified  Diet effective now                   EDUCATION NEEDS:   Not appropriate for education at this time  Skin:  Skin Assessment: Reviewed RN Assessment (PEG tube in place)  Last BM:  8/15 type 6 x 2  Height:   Ht Readings from Last 1 Encounters:  01/21/21 5\' 4"  (1.626 m)    Weight:   Wt Readings from Last 1 Encounters:  01/27/21 107.2 kg    Ideal Body Weight:   55 kg  BMI:  Body mass index is 40.57 kg/m.  Estimated Nutritional Needs:   Kcal:  1900-2100  Protein:  105-120 grams  Fluid:  >1.9 L   Royann Shivers MS,RD,CSG,LDN Contact: AMION

## 2021-01-27 NOTE — Consult Note (Signed)
[facility] Donika Butner A. Gerilyn Pilgrim, MD     [email or web address]          Denise Savage is an 60 y.o. female.   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 The dose of Vimpat will be escalated. Will continue with the current dose of Keppra. 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 The workup has been extensive with the opinion being that the pancreatitis is most likely due to Depakote. 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 This was several months ago. He does not appear that she has had recurrent seizures since he was hospitalized several months ago. She has been devastated neurologically since the unfortunate complications of the aneurysm. She currently resides in a nursing home facility. She is unable to provide a history.  GENERAL: The patient is resting in bed. She is markedly edematous/marked anasarca.  HEENT: Large neck; large left temporal craniotomy defect  ABDOMEN: Soft  EXTREMITIES: Marked edema  BACK: Normal alignment.  SKIN: Normal by inspection.    MENTAL STATUS: Slightly med with eyes closed. She does open her eyes to light sternal rub. She does not follow commands. There is no verbal output. CRANIAL NERVES: Pupils are equal, round and reactive to light; the eyes are tending towards deviation to the left/left gaze preference although she over comes midline about 80% spontaneously. Facial muscle strength is symmetric.  MOTOR: There is minimal response to painful stimuli.  COORDINATION: No tremors, myoclonus or dysmetria noted.  REFLEXES: Deep tendon reflexes are symmetrical and normal.   SENSATION: Minimal response to deep painful  stimuli.       NEURO NOTES 06-2020 - Unclear if episodes of forced gaze deviation are epileptic or not. No eeg changes on scalp eeg during these episodes, mostly noted when patient is awake and patient able to wince and move other left extremities raises suspicion that these are due to underlying injury rather than seizure. Could be due to stimulation induced due to cortical irritability from underlying injury   Recommendations: -Continue Keppra 1500 mg twice daily, Depakote 750 mg every 8 hours, Vimpat 50 mg twice daily    HPI: Denise Savage is a 60 y.o. female who initially presented on 06/15/2020 for headache.  CT head showed small volume acute subarachnoid hemorrhage at the base of the left sylvian fissure. CTA head and neck showed 6 x 6 x 9 mm irregular aneurysm arising from left MCA bifurcation.  Neurosurgery was consulted and patient had coiling done on 06/16/2020 complicated by intraoperative aneurysmal rupture and bleeding.  Was noted to have seizures and therefore started on Keppra followed by Depakote.  However seizures persisted.  Therefore neurology was consulted.   HPI: Denise Savage is a 60 y.o. female who initially presented on 06/15/2020 for headache.  CT head showed small volume acute subarachnoid hemorrhage at the base of the left sylvian fissure. CTA head and neck showed 6 x 6 x 9 mm irregular aneurysm arising from left MCA bifurcation.  Neurosurgery was consulted and patient had coiling done on 06/16/2020 complicated by intraoperative aneurysmal rupture and bleeding.  Was noted to have seizures and therefore started on Keppra followed by Depakote.  However seizures persisted.  Therefore neurology was consulted.  Blood pressure (!) 145/89, pulse 96, temperature 98.9 F (37.2 C), temperature source Oral, resp. rate 20, height 5\' 4"  (1.626 m), weight 107.2 kg, SpO2 95 %.  Past Medical History:  Diagnosis Date   Abdominal hernia    Dysphagia    Gastrostomy status (HCC)     Hemiplegia and hemiparesis following cerebral infarction affecting right dominant side (HCC)    Pulmonary embolus (HCC)    Seizures (HCC)     Past Surgical History:  Procedure Laterality Date   CRANIOTOMY Left 06/19/2020   Procedure: CRANIOTOMY HEMATOMA EVACUATION SUBDURAL;  Surgeon: 08/17/2020, MD;  Location: [facility] OR;  Service: Neurosurgery;  Laterality: Left;   ESOPHAGOGASTRODUODENOSCOPY N/A 07/01/2020   Procedure: ESOPHAGOGASTRODUODENOSCOPY (EGD);  Surgeon: 07/03/2020, MD;  Location: [facility] ENDOSCOPY;  Service: General;  Laterality: N/A;   IR ANGIO INTRA EXTRACRAN SEL INTERNAL CAROTID BILAT MOD SED  06/16/2020   IR ANGIO VERTEBRAL SEL VERTEBRAL UNI R MOD SED  06/16/2020   IR ANGIOGRAM FOLLOW UP STUDY  06/16/2020   IR ANGIOGRAM FOLLOW UP STUDY  06/16/2020   IR ANGIOGRAM FOLLOW UP STUDY  06/16/2020   IR CT HEAD LTD  06/16/2020   IR NEURO EACH ADD'L AFTER BASIC UNI RIGHT (MS)  06/16/2020   IR TRANSCATH/EMBOLIZ  06/16/2020   IR 08/14/2020 GUIDE VASC ACCESS RIGHT  06/16/2020   PEG PLACEMENT N/A 07/01/2020   Procedure: PERCUTANEOUS ENDOSCOPIC GASTROSTOMY (PEG) PLACEMENT;  Surgeon: 07/03/2020, MD;  Location: [facility] ENDOSCOPY;  Service: General;  Laterality: N/A;   RADIOLOGY WITH ANESTHESIA N/A 06/16/2020   Procedure: IR WITH ANESTHESIA;  Surgeon: 08/14/2020, MD;  Location: [facility] OR;  Service: Radiology;  Laterality: N/A;   TUBAL LIGATION     VENTRAL HERNIA REPAIR N/A 12/29/2014   Procedure: LAPAROSCOPIC VENTRAL HERNIA WITH MESH;  Surgeon: 12/31/2014, MD;  Location: [facility] ORS;  Service: General;  Laterality: N/A;    Family History  Problem Relation Age of Onset   Pancreatitis Neg Hx     Social History:  reports that she has never smoked. She has never used smokeless tobacco. She reports that she does not drink alcohol and does not use drugs.  Allergies:  Allergies  Allergen Reactions   Hydrocodone Nausea Only    Medications: Prior to Admission medications   Medication Sig Start Date End Date  Taking? Authorizing Provider  ELIQUIS 5 MG TABS tablet Take 5 mg by mouth 2 (two) times daily. 01/06/21  Yes [provider]  HYDROcodone-acetaminophen (NORCO) 7.5-325 MG tablet Place 1 tablet into feeding tube every 8 (eight) hours as needed for moderate pain.   Yes [provider]  lacosamide (VIMPAT) 10 MG/ML oral solution Take 5 mLs (50 mg total) by mouth 2 (two) times daily. Patient taking differently: Take 50 mg by mouth 2 (two) times daily. Peg-tube 09/08/20  Yes Costella, 09/10/20, PA-C  levETIRAcetam (KEPPRA) 100 MG/ML solution Place 15 mLs (1,500 mg total) into feeding tube 2 (two) times daily. Patient taking differently: Place 1,500 mg into feeding tube 2 (two) times daily. 900 & 2100 09/08/20  Yes Costella, 09/10/20, PA-C  losartan (COZAAR) 25 MG tablet Place 25 mg into feeding tube daily. 01/08/21  Yes [provider]  valproic acid (DEPAKENE) 250 MG/5ML solution Place 15 mLs (750 mg total) into feeding tube every 8 (eight) hours. Patient taking differently: Place 750 mg into feeding tube every 6 (six) hours. 09/08/20  Yes Costella, 09/10/20, PA-C  levETIRAcetam (KEPPRA) 500 MG/5ML injection  [provider]  tamsulosin (FLOMAX) 0.4 MG CAPS capsule Take 0.4 mg by mouth daily. Patient not taking: No sig reported 12/25/20   [provider]    Scheduled Meds:  apixaban  5 mg Per Tube BID   feeding supplement (OSMOLITE 1.2 CAL)  1,000 mL Per Tube Q24H   feeding supplement (PROSource TF)  90 mL Per Tube QID   free water  30 mL Per Tube Q4H   furosemide  40 mg Intravenous Daily   pantoprazole sodium  40 mg Per Tube Daily   Continuous Infusions:  dextrose 5 % and 0.45% NaCl 10 mL/hr at 01/27/21 1418   lacosamide (VIMPAT) IV 50 mg (01/27/21 1418)   levETIRAcetam     [START ON 01/28/2021] potassium chloride     valproate sodium 750 mg (01/27/21 1727)   PRN Meds:.acetaminophen **OR** acetaminophen, fentaNYL (SUBLIMAZE)  injection     Results for orders placed or performed during the hospital encounter of 01/21/21 (from the past 48 hour(s))  Glucose, capillary     Status: Abnormal   Collection Time: 01/25/21 11:37 PM  Result Value Ref Range   Glucose-Capillary 101 (H) 70 - 99 mg/dL    Comment: Glucose reference range applies only to samples taken after fasting for at least 8 hours.  Lipase, blood     Status: Abnormal   Collection Time: 01/26/21  3:36 AM  Result Value Ref Range   Lipase 326 (H) 11 - 51 U/L    Comment: Performed at [facility], [address], [place], [place] [address]  CBC     Status: Abnormal   Collection Time: 01/26/21  3:36 AM  Result Value Ref Range   WBC 9.5 4.0 - 10.5 K/uL   RBC 3.77 (L) 3.87 - 5.11 MIL/uL   Hemoglobin 11.3 (L) 12.0 - 15.0 g/dL   HCT 36.6 (L) 29.4 - 76.5 %   MCV 94.2 80.0 - 100.0 fL   MCH 30.0 26.0 - 34.0 pg   MCHC 31.8 30.0 - 36.0 g/dL   RDW 46.5 (H) 03.5 - 46.5 %   Platelets 163 150 - 400 K/uL   nRBC 0.0 0.0 - 0.2 %    Comment: Performed at [facility], [address], [place], [place] [address]  Comprehensive metabolic panel     Status: Abnormal   Collection Time: 01/26/21  3:36 AM  Result Value Ref Range   Sodium 136 135 - 145 mmol/L   Potassium 3.1 (L) 3.5 - 5.1 mmol/L    Comment: DELTA CHECK NOTED   Chloride 103 98 - 111 mmol/L   CO2 27 22 - 32 mmol/L   Glucose, Bld 151 (H) 70 - 99 mg/dL    Comment: Glucose reference range applies only to samples taken after fasting for at least 8 hours.   BUN 11 6 - 20 mg/dL   Creatinine, Ser 5.17 (L) 0.44 - 1.00 mg/dL   Calcium 9.4 8.9 - 00.1 mg/dL   Total Protein 5.3 (L) 6.5 - 8.1 g/dL   Albumin 1.7 (L) 3.5 - 5.0 g/dL   AST 25 15 - 41 U/L   ALT 9 0 - 44 U/L   Alkaline Phosphatase 48 38 - 126 U/L   Total Bilirubin 0.4 0.3 - 1.2 mg/dL   GFR, Estimated >74 >94 mL/min    Comment: (NOTE) Calculated using the CKD-EPI Creatinine Equation (2021)    Anion gap 6 5 - 15    Comment: Performed at [facility], [address] [place],  [place] [address]  Glucose, capillary     Status: Abnormal   Collection Time: 01/26/21  5:29 AM  Result Value Ref Range   Glucose-Capillary 155 (H) 70 - 99 mg/dL    Comment: Glucose reference range applies only to samples taken after fasting for at least 8 hours.  Glucose, capillary     Status: Abnormal   Collection Time: 01/26/21 11:15 AM  Result Value Ref Range   Glucose-Capillary 130 (H) 70 - 99 mg/dL    Comment: Glucose reference range applies only to samples taken after fasting for at least 8 hours.   Comment 1 Notify RN    Comment 2 Document in Chart   Glucose, capillary     Status: Abnormal   Collection Time: 01/26/21  6:47 PM  Result Value Ref Range   Glucose-Capillary 102 (H) 70 - 99 mg/dL    Comment: Glucose reference range applies only to samples taken after fasting for at least 8 hours.   Comment 1 Notify RN    Comment 2 Document in Chart   Glucose, capillary     Status: None   Collection Time: 01/26/21 11:44 PM  Result Value Ref Range   Glucose-Capillary 89 70 - 99 mg/dL    Comment: Glucose reference range applies only to samples taken after fasting for at least 8 hours.  Glucose, capillary     Status: None   Collection Time: 01/27/21  4:19 AM  Result Value Ref Range   Glucose-Capillary 99 70 - 99 mg/dL    Comment: Glucose reference range applies only to samples taken after fasting for at least 8 hours.  Glucose, capillary     Status: None   Collection Time: 01/27/21  7:47 AM  Result Value Ref Range   Glucose-Capillary 86 70 - 99 mg/dL    Comment: Glucose reference range applies only to samples taken after fasting for at least 8 hours.  Comprehensive metabolic panel     Status: Abnormal   Collection Time: 01/27/21  8:06 AM  Result Value Ref Range   Sodium 138 135 - 145 mmol/L   Potassium 3.4 (L) 3.5 - 5.1 mmol/L   Chloride 104 98 - 111 mmol/L   CO2 30 22 - 32 mmol/L   Glucose, Bld 108 (H) 70 - 99 mg/dL    Comment: Glucose reference  range applies only to samples taken after fasting for at least 8 hours.   BUN 10 6 - 20 mg/dL   Creatinine, Ser 8.31 (L) 0.44 - 1.00 mg/dL   Calcium 9.5 8.9 - 51.7 mg/dL   Total Protein 5.3 (L) 6.5 - 8.1 g/dL   Albumin 1.7 (L) 3.5 - 5.0 g/dL   AST 34 15 - 41 U/L   ALT 10 0 - 44 U/L   Alkaline Phosphatase 47 38 - 126 U/L   Total Bilirubin 0.5 0.3 - 1.2 mg/dL   GFR, Estimated >61 >60 mL/min    Comment: (NOTE) Calculated using the CKD-EPI Creatinine Equation (2021)    Anion gap 4 (L) 5 - 15    Comment: Performed at [facility], [address] [place], [place] [address]  Glucose, capillary     Status: Abnormal   Collection Time: 01/27/21 11:25 AM  Result Value Ref Range   Glucose-Capillary 110 (H) 70 - 99 mg/dL    Comment: Glucose reference range applies only to samples taken after fasting for at least 8 hours.  Glucose, capillary     Status: None   Collection Time: 01/27/21  4:25 PM  Result Value Ref Range   Glucose-Capillary 81 70 - 99 mg/dL    Comment: Glucose reference range applies only to samples taken after fasting for at least 8 hours.    Studies/Results:   HEAD  CT FINDINGS: Brain: Large area of encephalomalacia involving much of the left cerebral hemisphere, stable. Ex vacuo dilatation of the left lateral ventricle. No acute infarct or hemorrhage.   Vascular: Embolization coils seen in the left middle cerebral artery. No hyperdense vessel.   Skull: Prior large left craniectomy.   Sinuses/Orbits: No acute findings   Other: None   IMPRESSION: Prior left craniectomy with large area of encephalomalacia throughout much of the left cerebral hemisphere. Ex vacuo dilatation of the left lateral ventricle.   No acute intracranial abnormality.       Laylonie Marzec A. Gerilyn Pilgrimoonquah, M.D.  Diplomate, Biomedical engineerAmerican Board of Psychiatry and Neurology ( Neurology). 01/27/2021, 6:22 PM

## 2021-01-27 NOTE — Progress Notes (Signed)
PROGRESS NOTE    Patient: Denise Savage                            PCP: Charlynne Pander, MD                    DOB: 10-12-1960            DOA: 01/21/2021 MWN:[id number]             DOS: 01/27/2021, 12:48 PM   LOS: 6 days   Date of Service: The patient was seen and examined on 01/27/2021  Subjective:   The patient was seen and examined this morning, awake nonverbal bedbound in no acute distress. Noted for diffuse edema On room Air not in any respiratory distress  Patient's son present at bedside concerned about diffuse swelling....    Brief Narrative:   Denise Savage is a 60 y.o. 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  Patient was noted to develop nausea and vomiting this morning at her SNF, tube feeds were stopped, but she continued to have nausea and vomiting was sent to the ED.  She has never had pancreatitis before per report of her family.  Neurologically, her family reports that she has been making improvements since her ruptured aneurysm in January but is often nonverbal and when she does try to speak, cannot be understood.  Family suspects that she was in some pain today as they noted her blood pressure to be higher than normal.  Patient seemed more somnolent than usual today.    ED Course: Upon arrival to the ED, patient is found to be afebrile, saturating well on room air, and with stable blood pressure.  EKG features sinus rhythm with QTc interval of 635 ms.  Chemistry panel notable for elevated BUN to creatinine ratio.  CBC with mild polycythemia and slight thrombocytopenia.  Lactic acid normal.  Lipase 643.  Head CT negative for acute findings.  CT of the abdomen and pelvis concerning for acute pancreatitis without organized fluid  collection or necrosis.  Patient was given IV fluids and Zofran in the ED.    Assessment & Plan:   Principal Problem:   Acute pancreatitis Active Problems:   Aneurysmal subarachnoid hemorrhage (HCC)   Seizures (HCC)   Pulmonary embolus (HCC)   Dysphagia   Prolonged QT interval   Thickened endometrium    1. Acute pancreatitis /gastro duodenitis -Seems comfortable, in no acute distress  -Improved nausea vomiting... Continue as needed IV antiemetics - Lipase of 643 >>> 511 >>144 >>123>213>>326 - CT findings consistent with acute pancreatitis as well as gastritis and duodenitis - LFTs normal, no biliary dilation on CT  -Right upper quadrant ultrasound without cholelithiasis or cholecystitis findings - Triglycerides 91  - continue to hold tube feeds,  Continue IV fluids, pain-control, supportive care   - LA 1.9  -Patient was restarted on tube feed on 01/24/2021, however lipase started to trend up,, clinical exam is challenging as patient is nonverbal, no emesis, abdomen does not appear distended-- Patient seems comfortable, unable to express 7E abdominal pain  -Discussed with GI service--ideally we will not follow lipase but we have poor clinical ways to tell if patient's pancreatitis is worsening due to her somewhat vegetative  state from a neuro psychiatric standpoint -We will reduce tube feeding with Osmolite to 20 mL an hour--titrate up very slowly - ProSource TF 45 ml BID per tube -Water flushes 150 mL every 4 hours -Continue PPI -Remained stable  2. Prolonged QT interval  - QTc is 635 ms in ED  -Repeat EKG on 01/23/2021 with QTC less than 350 -Keep magnesium close to 2 and keep potassium close to 4   3. PE   - Admitted 11/18/20 with small RLL PE without heart strain and was discharged on Eliquis  - Continue Eliquis   -Stable   4. Seizures--status post prior craniectomy --very high risk for seizures, status post craniectomy, patient's Skull is currently in her left  abdominal area, plan is to put her skull  back at a later date -PTA patient was on Vimpat, Keppra and Depakote -Continue antiepileptics IV for now while observing bowel rest for acute pancreatitis Anticipating switching medication to p.o.   5. History of SAH; dysphagia; hemiparesis  -Nonverbal, bedbound   - Pt suffered ruptured intracranial aneurysm in January 2022  - She has ongoing dysphagia with PEG dependence, right hemiparesis, only occasionally tried to speak and is unintelligible  - Hold tube feeds for now, continue supportive care  - stable    6. Endometrial thickening and possible fibroid - Noted incidentally on CT, non-emergent US recommended  -Discussed with patient's son Ethelene Brownsnthony, outpatient follow-up advised   7) hypoglycemia due to n.p.o. status---  patient did receive D50 a from time to time --Anticipate blood sugars will improve once patient tolerating tube feeding better   8) hypercalcemia--- episodes of hypercalcemia from time to time usually improves with hydration  Disposition--- discharge back to SNF when tolerating tube feed well - ----------------------------------------------------------------------------------------------------------------------------------------------- Nutritional status:  The patient's BMI is: Body mass index is 40.57 kg/m. I agree with the assessment and plan as outlined below: Nutrition Status: Nutrition Problem: Inadequate oral intake Etiology: inability to eat Signs/Symptoms: NPO status Interventions: Tube feeding, Prostat ------------------------------------------------------------------------------------------------------------------------------------------------ Cultures / Antimicrobials: none    Consultants: None  ------------------------------------------------------------------------------------------------------------------------------------------------  DVT prophylaxis:  SCD/Compression stockings and ZO:XWRUEAVOn:Eliquis Code  Status:   Code Status: Full Code  Family Communication: Discussed with her daughters and son Ethelene Brownsnthony  Admission status:   Status is: Inpatient  Remains inpatient appropriate because:IV treatments appropriate due to intensity of illness or inability to take PO and Inpatient level of care appropriate due to severity of illness  Dispo: The patient is from: SNF              Anticipated d/c is to: SNF 1-2 days - discharge back to SNF when tolerating tube feed well              Patient currently is not medically stable to d/c.   Difficult to place patient No    Level of care: Med-Surg   Procedures:   No admission procedures for hospital encounter.    Antimicrobials:  None    Medication:   apixaban  5 mg Per Tube BID   feeding supplement (OSMOLITE 1.2 CAL)  1,000 mL Per Tube Q24H   feeding supplement (PROSource TF)  45 mL Per Tube BID   free water  30 mL Per Tube Q4H   furosemide  40 mg Intravenous Daily   pantoprazole sodium  40 mg Per Tube Daily    acetaminophen **OR** acetaminophen, fentaNYL (SUBLIMAZE) injection   Objective:   Vitals:   01/26/21 1507 01/26/21 2134 01/27/21 0500 01/27/21 0541  BP: Marland Kitchen(!)  132/91 130/89  (!) 142/87  Pulse: 94 96  93  Resp: (!) 24 20  16   Temp: 98.3 F (36.8 C) 98.9 F (37.2 C)  98.3 F (36.8 C)  TempSrc: Oral Oral    SpO2: 99% 100%  98%  Weight:   107.2 kg   Height:        Intake/Output Summary (Last 24 hours) at 01/27/2021 1248 Last data filed at 01/27/2021 0900 Gross per 24 hour  Intake 3408.87 ml  Output 200 ml  Net 3208.87 ml   Filed Weights   01/25/21 0500 01/26/21 0500 01/27/21 0500  Weight: 104.1 kg 104.2 kg 107.2 kg     Examination:      Physical Exam:   General:  Awake nonverbal, bedbound  HEENT:  Normocephalic, PERRL, otherwise with in Normal limits  Left side skull not present  Neuro:  CNII-XII intact. , normal motor and sensation, reflexes intact   Lungs:   Clear to auscultation BL, Respirations  unlabored, no wheezes / crackles  Cardio:    S1/S2, RRR, No murmure, No Rubs or Gallops   Abdomen:   Soft, non-tender, bowel sounds active all four quadrants,  no guarding or peritoneal signs.... PEG tube in place,  patient's skull is actually in her left abdominal area-  Muscular skeletal:  Bedbound, +3 pitting edema upper and lower extremities..   Skin:  Dry, warm to touch, negative for any Rashes,   Wounds: Please see nursing documentation          ------------------------------------------------------------------------------------------------------------------------------------------    LABs:  CBC Latest Ref Rng & Units 01/26/2021 01/24/2021 01/23/2021  WBC 4.0 - 10.5 K/uL 9.5 20.8(H) 24.7(H)  Hemoglobin 12.0 - 15.0 g/dL 11.3(L) 12.5 15.0  Hematocrit 36.0 - 46.0 % 35.5(L) 40.2 49.3(H)  Platelets 150 - 400 K/uL 163 151 135(L)   CMP Latest Ref Rng & Units 01/27/2021 01/26/2021 01/24/2021  Glucose 70 - 99 mg/dL 01/26/2021) 016(W) 109(N)  BUN 6 - 20 mg/dL 10 11 18   Creatinine 0.44 - 1.00 mg/dL 23(F) ) 5.73(U  Sodium 135 - 145 mmol/L 138 136 136  Potassium 3.5 - 5.1 mmol/L 3.4(L) 3.1(L) 4.2  Chloride 98 - 111 mmol/L 104 103 106  CO2 22 - 32 mmol/L 30 27 23   Calcium 8.9 - 10.3 mg/dL 9.5 9.4 9.4  Total Protein 6.5 - 8.1 g/dL 5.3(L) 5.3(L) -  Total Bilirubin 0.3 - 1.2 mg/dL 0.5 0.4 -  Alkaline Phos 38 - 126 U/L 47 48 -  AST 15 - 41 U/L 34 25 -  ALT 0 - 44 U/L 10 9 -     Micro Results Recent Results (from the past 240 hour(s))  Resp Panel by RT-PCR (Flu A&B, Covid) Nasopharyngeal Swab     Status: None   Collection Time: 01/21/21  6:34 PM   Specimen: Nasopharyngeal Swab; Nasopharyngeal(NP) swabs in vial transport medium  Result Value Ref Range Status   SARS Coronavirus 2 by RT PCR NEGATIVE NEGATIVE Final    Comment: (NOTE) SARS-CoV-2 target nucleic acids are NOT DETECTED.  The SARS-CoV-2 RNA is generally detectable in upper respiratory specimens during the acute phase of  infection. The lowest concentration of SARS-CoV-2 viral copies this assay can detect is 138 copies/mL. A negative result does not preclude SARS-Cov-2 infection and should not be used as the sole basis for treatment or other patient management decisions. A negative result may occur with  improper specimen collection/handling, submission of specimen other than nasopharyngeal swab, presence of viral mutation(s) within the areas targeted  by this assay, and inadequate number of viral copies(<138 copies/mL). A negative result must be combined with clinical observations, patient history, and epidemiological information. The expected result is Negative.  Fact Sheet for Patients:  BloggerCourse.com  Fact Sheet for Healthcare Providers:  SeriousBroker.it  This test is no t yet approved or cleared by the [place] FDA and  has been authorized for detection and/or diagnosis of SARS-CoV-2 by FDA under an Emergency Use Authorization (EUA). This EUA will remain  in effect (meaning this test can be used) for the duration of the COVID-19 declaration under Section 564(b)(1) of the Act, 21 U.S.C.section 360bbb-3(b)(1), unless the authorization is terminated  or revoked sooner.       Influenza A by PCR NEGATIVE NEGATIVE Final   Influenza B by PCR NEGATIVE NEGATIVE Final    Comment: (NOTE) The Xpert Xpress SARS-CoV-2/FLU/RSV plus assay is intended as an aid in the diagnosis of influenza from Nasopharyngeal swab specimens and should not be used as a sole basis for treatment. Nasal washings and aspirates are unacceptable for Xpert Xpress SARS-CoV-2/FLU/RSV testing.  Fact Sheet for Patients: BloggerCourse.com  Fact Sheet for Healthcare Providers: SeriousBroker.it  This test is not yet approved or cleared by the [place] FDA and has been authorized for detection and/or diagnosis of SARS-CoV-2  by FDA under an Emergency Use Authorization (EUA). This EUA will remain in effect (meaning this test can be used) for the duration of the COVID-19 declaration under Section 564(b)(1) of the Act, 21 U.S.C. section 360bbb-3(b)(1), unless the authorization is terminated or revoked.  Performed at [facility], [address], [place], [place] [address]     Radiology Reports CT Head Wo Contrast  Result Date: 01/21/2021 CLINICAL DATA:  Mental status change, unknown cause EXAM: CT HEAD WITHOUT CONTRAST TECHNIQUE: Contiguous axial images were obtained from the base of the skull through the vertex without intravenous contrast. 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 Ex vacuo dilatation of the left lateral ventricle. No acute infarct or hemorrhage. Vascular: Embolization coils seen in the left middle cerebral artery. No hyperdense vessel. Skull: Prior large left craniectomy. Sinuses/Orbits: No acute findings Other: None IMPRESSION: Prior left craniectomy with large area of encephalomalacia throughout much of the left cerebral hemisphere. Ex vacuo dilatation of the left lateral ventricle. No acute intracranial abnormality. Electronically Signed   By: Charlett Nose M.D.   On: 01/21/2021 19:38   CT Abdomen Pelvis W Contrast  Result Date: 01/21/2021 CLINICAL DATA:  Vomiting peg tube EXAM: CT ABDOMEN AND PELVIS WITH CONTRAST TECHNIQUE: Multidetector CT imaging of the abdomen and pelvis was performed using the standard protocol following bolus administration of intravenous contrast. CONTRAST:  54mL OMNIPAQUE IOHEXOL 350 MG/ML SOLN COMPARISON:  None. FINDINGS: Lower chest: Lung bases demonstrate partial lower lobe consolidation which may reflect atelectasis or aspiration. No pleural effusion. Cardiomegaly. Hepatobiliary: Subcentimeter hypodense lesion posterior right hepatic lobe too small to further characterize. No calcified  gallstone or biliary dilatation Pancreas: Pancreatic enlargement with marked peripancreatic inflammatory change and fluid consistent with acute pancreatitis. No organized fluid collections. Homogeneous enhancement. Spleen: Normal in size without focal abnormality. Adrenals/Urinary Tract: Adrenal glands are normal. Cyst lower pole left kidney. Small bilateral kidney stones. The bladder is thick walled and indistinct. Stomach/Bowel: Stomach nonenlarged. Mild wall thickening and mucosal enhancement of distal stomach and first portion of duodenum. No dilated small bowel. Hyperdense material in the colon. No acute bowel wall thickening. Gastrostomy tube in the  distal body of the stomach. Vascular/Lymphatic: Nonaneurysmal aorta with mild atherosclerosis. No suspicious lymph nodes. Portal and splenic veins normally enhance. Reproductive: Prominent size uterus. Endometrial stripe slightly thickened up to 11 mm with focal hypodense area in the posterior lower uterine segment questionable for fibroid. No adnexal mass Other: No free air. Small free fluid in the abdomen and pelvis. 11 by 12 by 9.4 cm suspected foreign body within the subcutaneous fat of the left anterior abdominal wall with small adjacent fluid. This is seen at the level of the umbilicus. Evidence of prior ventral hernia repair with small fat containing recurrent ventral hernia. Musculoskeletal: No acute osseous abnormality IMPRESSION: 1. Findings consistent with acute pancreatitis. No organized fluid collection or evidence for necrosis at this time. Small amount of abdominopelvic ascites 2. Gastrostomy tube present in the distal body of stomach. Mild wall thickening and mucosal enhancement of distal stomach and first portion of duodenum likely reflecting gastritis/duodenitis versus reactive inflammation from adjacent inflamed pancreas 3. 11 x 12 x 9.4 cm suspected foreign body within the subcutaneous fat of the left anterior body wall at the level of the  umbilicus. Small amount of fluid surrounding the foreign body. 4. Cardiomegaly. Patchy atelectasis or aspiration changes at the bases. 5. Thickened endometrial stripe with slightly enlarged appearing uterus for age. Possible hypoechoic mass within the posterior lower uterine segment. Recommend correlation with nonemergent ultrasound. Electronically Signed   By: Jasmine Pang M.D.   On: 01/21/2021 19:47   DG Chest Port 1 View  Result Date: 01/21/2021 CLINICAL DATA:  Altered mental status. EXAM: PORTABLE CHEST 1 VIEW COMPARISON:  July 15, 2020. FINDINGS: Stable cardiomediastinal silhouette. Tracheostomy tube has been removed. Both lungs are clear. The visualized skeletal structures are unremarkable. IMPRESSION: No active disease. Electronically Signed   By: Lupita Raider M.D.   On: 01/21/2021 16:27   US Abdomen Limited RUQ (LIVER/GB)  Result Date: 01/22/2021 CLINICAL DATA:  Acute pancreatitis EXAM: ULTRASOUND ABDOMEN LIMITED RIGHT UPPER QUADRANT COMPARISON:  CT abdomen 01/21/2021 FINDINGS: Gallbladder: Gallbladder wall is within normal limits at 0.2 cm. No gallstones or sonographic Murphy sign identified. Common bile duct: Diameter: 0.3 cm Liver: Mildly coarsened echotexture, without substantial focal lesion identified. Portal vein is patent on color Doppler imaging with normal direction of blood flow towards the liver. Other: Trace perihepatic ascites. IMPRESSION: 1. No findings of gallstones or biliary dilatation. 2. Trace perihepatic ascites. 3. Subtle coarsening of the echotexture of the liver is nonspecific but can be seen in a variety of conditions including hepatitis, metabolic abnormalities, cirrhosis, and hemochromatosis. Electronically Signed   By: Gaylyn Rong M.D.   On: 01/22/2021 10:46    SIGNED: Kendell Bane, MD,  [facility],  Pager (please use [email or web address] to page/text) Please use Epic Secure Chat for non-urgent communication (7AM-7PM)  If 7PM-7AM, please contact  night-coverage [email or web address], 01/27/2021, 12:48 PM

## 2021-01-28 DIAGNOSIS — R1310 Dysphagia, unspecified: Secondary | ICD-10-CM | POA: Diagnosis not present

## 2021-01-28 DIAGNOSIS — R9431 Abnormal electrocardiogram [ECG] [EKG]: Secondary | ICD-10-CM | POA: Diagnosis not present

## 2021-01-28 DIAGNOSIS — I608 Other nontraumatic subarachnoid hemorrhage: Secondary | ICD-10-CM | POA: Diagnosis not present

## 2021-01-28 DIAGNOSIS — R9389 Abnormal findings on diagnostic imaging of other specified body structures: Secondary | ICD-10-CM | POA: Diagnosis not present

## 2021-01-28 DIAGNOSIS — R569 Unspecified convulsions: Secondary | ICD-10-CM | POA: Diagnosis not present

## 2021-01-28 DIAGNOSIS — I2699 Other pulmonary embolism without acute cor pulmonale: Secondary | ICD-10-CM | POA: Diagnosis not present

## 2021-01-28 DIAGNOSIS — K853 Drug induced acute pancreatitis without necrosis or infection: Secondary | ICD-10-CM | POA: Diagnosis not present

## 2021-01-28 DIAGNOSIS — K859 Acute pancreatitis without necrosis or infection, unspecified: Secondary | ICD-10-CM | POA: Diagnosis not present

## 2021-01-28 LAB — GLUCOSE, CAPILLARY
Glucose-Capillary: 123 mg/dL — ABNORMAL HIGH (ref 70–99)
Glucose-Capillary: 75 mg/dL (ref 70–99)
Glucose-Capillary: 82 mg/dL (ref 70–99)
Glucose-Capillary: 83 mg/dL (ref 70–99)
Glucose-Capillary: 88 mg/dL (ref 70–99)
Glucose-Capillary: 91 mg/dL (ref 70–99)
Glucose-Capillary: 97 mg/dL (ref 70–99)

## 2021-01-28 MED ORDER — LACOSAMIDE 200 MG/20ML IV SOLN
INTRAVENOUS | Status: AC
  Filled 2021-01-28: qty 20

## 2021-01-28 NOTE — Progress Notes (Signed)
Dr. Flossie Dibble notified that Osmolite 1.2 is unavailable via secure chat asked MD to change order he replied ok to change to Osmolite 1.5 at same rate.

## 2021-01-28 NOTE — Progress Notes (Signed)
No verbalization  Neurology consultation reviewed.  Vital signs in last 24 hours: Temp:  [98.6 F (37 C)-98.7 F (37.1 C)] 98.7 F (37.1 C) (08/18 1503) Pulse Rate:  [87-95] 87 (08/18 1503) Resp:  [20] 20 (08/18 1503) BP: (142-145)/(75-87) 145/87 (08/18 1503) SpO2:  [96 %-100 %] 100 % (08/18 1503) Weight:  [102.2 kg] 102.2 kg (08/18 0556) Last BM Date: 01/28/21 General:   Awake.  Nonverbal Abdomen: Obese.  PEG tube in place.  Tube feeds underway.  Abdomen is soft; no grimacing to fairly deep palpation. no appreciable mass.    Intake/Output from previous day: 08/17 0701 - 08/18 0700 In: 1020 [NG/GT:920; IV Piggyback:100] Out: 2100 [Urine:2100] Intake/Output this shift: No intake/output data recorded.  Lab Results: Recent Labs    01/26/21 0336  WBC 9.5  HGB 11.3*  HCT 35.5*  PLT 163   BMET Recent Labs    01/26/21 0336 01/27/21 0806  NA 136 138  K 3.1* 3.4*  CL 103 104  CO2 27 30  GLUCOSE 151* 108*  BUN 11 10  CREATININE 0.41* 0.37*  CALCIUM 9.4 9.5   LFT Recent Labs    01/27/21 0806  PROT 5.3*  ALBUMIN 1.7*  AST 34  ALT 10  ALKPHOS 47  BILITOT 0.5    Impression: 60 year old lady with diminished mental capacity secondary to ruptured intracranial aneurysm with devastating neurological effect admitted to the hospital with nausea, vomiting;  found to have acute pancreatitis biochemically and radiographically.  At this time, her pancreatitis appears uncomplicated and improving.  She is not tachycardic or febrile which is reassuring.  Given work-up thus far, I agree that it is reasonable to conclude valproic acid may well be the culprit as to the cause of pancreatitis.  Neurology has seen patient and is discontinuing the valproic acid in tapering fashion.  Recommendations:  Continue supportive measures.  Continue tube feedings.  Agree with minimizing lab draws specifically for serum lipase.  We will continue to follow.

## 2021-01-28 NOTE — Progress Notes (Signed)
PROGRESS NOTE    Patient: Denise Savage                            PCP: Charlynne PanderBlass, Joel, MD                    DOB: 09-Jul-1960            DOA: 01/21/2021 [id number]             DOS: 01/28/2021, 1:35 PM   LOS: 7 days   Date of Service: The patient was seen and examined on 01/28/2021  Subjective:   The patient was seen and examined this morning, excessively somnolent. Hemodynamically stable Nonverbal, bedbound at baseline  Patient son present at bedside Explained to the patient's son that neurologist was consulted, planning to taper down Depakote, increase Vimpat...   Brief Narrative:   Denise DoffingCarol J Camacho is a 60 y.o. 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  Patient was noted to develop nausea and vomiting this morning at her SNF, tube feeds were stopped, but she continued to have nausea and vomiting was sent to the ED.  She has never had pancreatitis before per report of her family.  Neurologically, her family reports that she has been making improvements since her ruptured aneurysm in January but is often nonverbal and when she does try to speak, cannot be understood.  Family suspects that she was in some pain today as they noted her blood pressure to be higher than normal.  Patient seemed more somnolent than usual today.    ED Course: Upon arrival to the ED, patient is found to be afebrile, saturating well on room air, and with stable blood pressure.  EKG features sinus rhythm with QTc interval of 635 ms.  Chemistry panel notable for elevated BUN to creatinine ratio.  CBC with mild polycythemia and slight thrombocytopenia.  Lactic acid normal.  Lipase 643.  Head CT negative for acute findings.  CT of the abdomen and pelvis concerning for acute  pancreatitis without organized fluid collection or necrosis.  Patient was given IV fluids and Zofran in the ED.    Assessment & Plan:   Principal Problem:   Acute pancreatitis Active Problems:   Aneurysmal subarachnoid hemorrhage (HCC)   Seizures (HCC)   Pulmonary embolus (HCC)   Dysphagia   Prolonged QT interval   Thickened endometrium    1. Acute pancreatitis /gastro duodenitis -Seems comfortable in no distress, tube feeds has been advanced  -Improved nausea vomiting... Continue as needed IV antiemetics - Lipase of 643 >>> 511 >>144 >>123>213>>326 - CT findings consistent with acute pancreatitis as well as gastritis and duodenitis - LFTs normal, no biliary dilation on CT  -Right upper quadrant ultrasound without cholelithiasis or cholecystitis findings - Triglycerides 91  - continue to hold tube feeds,  Continue IV fluids, pain-control, supportive care   - LA 1.9  -Patient was restarted on tube feed on 01/24/2021, however lipase started to trend up,, clinical exam is challenging as patient is nonverbal, no emesis, abdomen does not appear distended-- Patient seems comfortable, unable to express 7E abdominal pain  -Discussed with GI service--ideally we will not follow lipase but we have poor clinical ways to tell if patient's pancreatitis is worsening due  to her somewhat vegetative state from a neuro psychiatric standpoint -We will reduce tube feeding with Osmolite to 20 mL an hour--titrate up very slowly - ProSource TF 45 ml BID per tube -Water flushes 150 mL every 4 hours -Continue PPI -Remained stable  2. Prolonged QT interval  -Much improved - QTc is 635 ms in ED  -Repeat EKG on 01/23/2021 with QTC less than 350 -Keep magnesium close to 2 and keep potassium close to 4   3. PE   - Admitted 11/18/20 with small RLL PE without heart strain and was discharged on Eliquis  - Continue Eliquis   -Remained stable   4. Seizures--status post prior craniectomy --very high risk  for seizures, status post craniectomy, patient's Skull is currently in her left abdominal area, plan is to put her skull  back at a later date -PTA patient is on Vimpat, Keppra and Depakote -Continue antiepileptics IV for now while observing bowel rest for acute pancreatitis Anticipating switching medication to p.o. when can tolerate p.o. or via PEG tube -Consulting neurologist planning to taper off Depakote, increasing Vimpat, continue Keppra Appreciate neurology's input -Patient seems to be more somnolent today   5. History of SAH; dysphagia; hemiparesis  -Nonverbal, bedbound   - Pt suffered ruptured intracranial aneurysm in January 2022  - She has ongoing dysphagia with PEG dependence, right hemiparesis, only occasionally tried to speak and is unintelligible  - Hold tube feeds for now, continue supportive care  - stable    6. Endometrial thickening and possible fibroid - Noted incidentally on CT, non-emergent [place] recommended  -Discussed with patient's son Ethelene Browns, outpatient follow-up advised   7) hypoglycemia due to n.p.o. status---  patient did receive D50 a from time to time --Anticipate blood sugars will improve once patient tolerating tube feeding better   8) hypercalcemia--- episodes of hypercalcemia from time to time usually improves with hydration  Disposition--- discharge back to SNF when tolerating tube feed well - ----------------------------------------------------------------------------------------------------------------------------------------------- Nutritional status:  The patient's BMI is: Body mass index is 38.67 kg/m. I agree with the assessment and plan as outlined below: Nutrition Status: Nutrition Problem: Inadequate oral intake Etiology: inability to eat Signs/Symptoms: NPO status Interventions: Tube feeding,  Prostat ------------------------------------------------------------------------------------------------------------------------------------------------ Cultures / Antimicrobials: none    Consultants: None  ------------------------------------------------------------------------------------------------------------------------------------------------  DVT prophylaxis:  SCD/Compression stockings and KG:MWNUUVO Code Status:   Code Status: Full Code  Family Communication: Discussed with her daughters and son Ethelene Browns  Admission status:   Status is: Inpatient  Remains inpatient appropriate because:IV treatments appropriate due to intensity of illness or inability to take PO and Inpatient level of care appropriate due to severity of illness  Dispo: The patient is from: SNF              Anticipated d/c is to: SNF 1-2 days - discharge back to SNF when tolerating tube feed well              Patient currently is not medically stable to d/c.   Difficult to place patient No    Level of care: Med-Surg   Procedures:   No admission procedures for hospital encounter.    Antimicrobials:  None    Medication:   apixaban  5 mg Per Tube BID   feeding supplement (OSMOLITE 1.2 CAL)  1,000 mL Per Tube Q24H   feeding supplement (PROSource TF)  90 mL Per Tube QID   free water  30 mL Per Tube Q4H   pantoprazole sodium  40 mg Per Tube Daily  acetaminophen **OR** acetaminophen, fentaNYL (SUBLIMAZE) injection   Objective:   Vitals:   01/27/21 2100 01/28/21 0500 01/28/21 0523 01/28/21 0556  BP: (!) 143/93  (!) 142/75   Pulse: 99  95   Resp: 19  20   Temp: 98.9 F (37.2 C)  98.6 F (37 C)   TempSrc: Axillary  Oral   SpO2: 99%  96%   Weight:  102.2 kg  102.2 kg  Height:        Intake/Output Summary (Last 24 hours) at 01/28/2021 1335 Last data filed at 01/28/2021 0900 Gross per 24 hour  Intake 1020 ml  Output 2100 ml  Net -1080 ml   Filed Weights   01/27/21 0500 01/28/21 0500  01/28/21 0556  Weight: 107.2 kg 102.2 kg 102.2 kg     Examination:       Physical Exam:   General:  Somnolent this morning, nonverbal at baseline  HEENT:  Normocephalic, PERRL, otherwise with in Normal limits   Neuro:  Limited exam, somnolent this morning, nonverbal, bedbound at baseline  Lungs:   Clear to auscultation BL, Respirations unlabored, no wheezes / crackles  Cardio:    S1/S2, RRR, No murmure, No Rubs or Gallops   Abdomen:   Soft, non-tender, bowel sounds active all four quadrants,  no guarding or peritoneal signs PEG tube in place skull is actually in her left abdominal area-  Muscular skeletal:  Limited exam - in bed, bed bound,  2+ pulses, , +2 pitting edema  Skin:  Dry, warm to touch, negative for any Rashes,  Wounds: Please see nursing documentation         ------------------------------------------------------------------------------------------------------------------------------------------    LABs:  CBC Latest Ref Rng & Units 01/26/2021 01/24/2021 01/23/2021  WBC 4.0 - 10.5 K/uL 9.5 20.8(H) 24.7(H)  Hemoglobin 12.0 - 15.0 g/dL 11.3(L) 12.5 15.0  Hematocrit 36.0 - 46.0 % 35.5(L) 40.2 49.3(H)  Platelets 150 - 400 K/uL 163 151 135(L)   CMP Latest Ref Rng & Units 01/27/2021 01/26/2021 01/24/2021  Glucose 70 - 99 mg/dL 741(O) 878(M) 76(H)  BUN 6 - 20 mg/dL 10 11 18   Creatinine 0.44 - 1.00 mg/dL ) 2.09(O) 7.09(G  Sodium 135 - 145 mmol/L 138 136 136  Potassium 3.5 - 5.1 mmol/L 3.4(L) 3.1(L) 4.2  Chloride 98 - 111 mmol/L 104 103 106  CO2 22 - 32 mmol/L 30 27 23   Calcium 8.9 - 10.3 mg/dL 9.5 9.4 9.4  Total Protein 6.5 - 8.1 g/dL 5.3(L) 5.3(L) -  Total Bilirubin 0.3 - 1.2 mg/dL 0.5 0.4 -  Alkaline Phos 38 - 126 U/L 47 48 -  AST 15 - 41 U/L 34 25 -  ALT 0 - 44 U/L 10 9 -     Micro Results Recent Results (from the past 240 hour(s))  Resp Panel by RT-PCR (Flu A&B, Covid) Nasopharyngeal Swab     Status: None   Collection Time: 01/21/21  6:34 PM    Specimen: Nasopharyngeal Swab; Nasopharyngeal(NP) swabs in vial transport medium  Result Value Ref Range Status   SARS Coronavirus 2 by RT PCR NEGATIVE NEGATIVE Final    Comment: (NOTE) SARS-CoV-2 target nucleic acids are NOT DETECTED.  The SARS-CoV-2 RNA is generally detectable in upper respiratory specimens during the acute phase of infection. The lowest concentration of SARS-CoV-2 viral copies this assay can detect is 138 copies/mL. A negative result does not preclude SARS-Cov-2 infection and should not be used as the sole basis for treatment or other patient management decisions. A negative result may  occur with  improper specimen collection/handling, submission of specimen other than nasopharyngeal swab, presence of viral mutation(s) within the areas targeted by this assay, and inadequate number of viral copies(<138 copies/mL). A negative result must be combined with clinical observations, patient history, and epidemiological information. The expected result is Negative.  Fact Sheet for Patients:  BloggerCourse.com  Fact Sheet for Healthcare Providers:  SeriousBroker.it  This test is no t yet approved or cleared by the [place] FDA and  has been authorized for detection and/or diagnosis of SARS-CoV-2 by FDA under an Emergency Use Authorization (EUA). This EUA will remain  in effect (meaning this test can be used) for the duration of the COVID-19 declaration under Section 564(b)(1) of the Act, 21 U.S.C.section 360bbb-3(b)(1), unless the authorization is terminated  or revoked sooner.       Influenza A by PCR NEGATIVE NEGATIVE Final   Influenza B by PCR NEGATIVE NEGATIVE Final    Comment: (NOTE) The Xpert Xpress SARS-CoV-2/FLU/RSV plus assay is intended as an aid in the diagnosis of influenza from Nasopharyngeal swab specimens and should not be used as a sole basis for treatment. Nasal washings and aspirates are  unacceptable for Xpert Xpress SARS-CoV-2/FLU/RSV testing.  Fact Sheet for Patients: BloggerCourse.com  Fact Sheet for Healthcare Providers: SeriousBroker.it  This test is not yet approved or cleared by the [place] FDA and has been authorized for detection and/or diagnosis of SARS-CoV-2 by FDA under an Emergency Use Authorization (EUA). This EUA will remain in effect (meaning this test can be used) for the duration of the COVID-19 declaration under Section 564(b)(1) of the Act, 21 U.S.C. section 360bbb-3(b)(1), unless the authorization is terminated or revoked.  Performed at [facility], [address] [place], [place] [address]     Radiology Reports CT Head Wo Contrast  Result Date: 01/21/2021 CLINICAL DATA:  Mental status change, unknown cause EXAM: CT HEAD WITHOUT CONTRAST TECHNIQUE: Contiguous axial images were obtained from the base of the skull through the vertex without intravenous contrast. 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 Ex vacuo dilatation of the left lateral ventricle. No acute infarct or hemorrhage. Vascular: Embolization coils seen in the left middle cerebral artery. No hyperdense vessel. Skull: Prior large left craniectomy. Sinuses/Orbits: No acute findings Other: None IMPRESSION: Prior left craniectomy with large area of encephalomalacia throughout much of the left cerebral hemisphere. Ex vacuo dilatation of the left lateral ventricle. No acute intracranial abnormality. Electronically Signed   By: Charlett Nose M.D.   On: 01/21/2021 19:38   CT Abdomen Pelvis W Contrast  Result Date: 01/21/2021 CLINICAL DATA:  Vomiting peg tube EXAM: CT ABDOMEN AND PELVIS WITH CONTRAST TECHNIQUE: Multidetector CT imaging of the abdomen and pelvis was performed using the standard protocol following bolus administration of intravenous contrast.  CONTRAST:  70mL OMNIPAQUE IOHEXOL 350 MG/ML SOLN COMPARISON:  None. FINDINGS: Lower chest: Lung bases demonstrate partial lower lobe consolidation which may reflect atelectasis or aspiration. No pleural effusion. Cardiomegaly. Hepatobiliary: Subcentimeter hypodense lesion posterior right hepatic lobe too small to further characterize. No calcified gallstone or biliary dilatation Pancreas: Pancreatic enlargement with marked peripancreatic inflammatory change and fluid consistent with acute pancreatitis. No organized fluid collections. Homogeneous enhancement. Spleen: Normal in size without focal abnormality. Adrenals/Urinary Tract: Adrenal glands are normal. Cyst lower pole left kidney. Small bilateral kidney stones. The bladder is thick walled and indistinct. Stomach/Bowel: Stomach nonenlarged. Mild wall thickening and mucosal enhancement of distal stomach and first  portion of duodenum. No dilated small bowel. Hyperdense material in the colon. No acute bowel wall thickening. Gastrostomy tube in the distal body of the stomach. Vascular/Lymphatic: Nonaneurysmal aorta with mild atherosclerosis. No suspicious lymph nodes. Portal and splenic veins normally enhance. Reproductive: Prominent size uterus. Endometrial stripe slightly thickened up to 11 mm with focal hypodense area in the posterior lower uterine segment questionable for fibroid. No adnexal mass Other: No free air. Small free fluid in the abdomen and pelvis. 11 by 12 by 9.4 cm suspected foreign body within the subcutaneous fat of the left anterior abdominal wall with small adjacent fluid. This is seen at the level of the umbilicus. Evidence of prior ventral hernia repair with small fat containing recurrent ventral hernia. Musculoskeletal: No acute osseous abnormality IMPRESSION: 1. Findings consistent with acute pancreatitis. No organized fluid collection or evidence for necrosis at this time. Small amount of abdominopelvic ascites 2. Gastrostomy tube present  in the distal body of stomach. Mild wall thickening and mucosal enhancement of distal stomach and first portion of duodenum likely reflecting gastritis/duodenitis versus reactive inflammation from adjacent inflamed pancreas 3. 11 x 12 x 9.4 cm suspected foreign body within the subcutaneous fat of the left anterior body wall at the level of the umbilicus. Small amount of fluid surrounding the foreign body. 4. Cardiomegaly. Patchy atelectasis or aspiration changes at the bases. 5. Thickened endometrial stripe with slightly enlarged appearing uterus for age. Possible hypoechoic mass within the posterior lower uterine segment. Recommend correlation with nonemergent ultrasound. Electronically Signed   By: Jasmine Pang M.D.   On: 01/21/2021 19:47   DG Chest Port 1 View  Result Date: 01/21/2021 CLINICAL DATA:  Altered mental status. EXAM: PORTABLE CHEST 1 VIEW COMPARISON:  July 15, 2020. FINDINGS: Stable cardiomediastinal silhouette. Tracheostomy tube has been removed. Both lungs are clear. The visualized skeletal structures are unremarkable. IMPRESSION: No active disease. Electronically Signed   By: Lupita Raider M.D.   On: 01/21/2021 16:27   US Abdomen Limited RUQ (LIVER/GB)  Result Date: 01/22/2021 CLINICAL DATA:  Acute pancreatitis EXAM: ULTRASOUND ABDOMEN LIMITED RIGHT UPPER QUADRANT COMPARISON:  CT abdomen 01/21/2021 FINDINGS: Gallbladder: Gallbladder wall is within normal limits at 0.2 cm. No gallstones or sonographic Murphy sign identified. Common bile duct: Diameter: 0.3 cm Liver: Mildly coarsened echotexture, without substantial focal lesion identified. Portal vein is patent on color Doppler imaging with normal direction of blood flow towards the liver. Other: Trace perihepatic ascites. IMPRESSION: 1. No findings of gallstones or biliary dilatation. 2. Trace perihepatic ascites. 3. Subtle coarsening of the echotexture of the liver is nonspecific but can be seen in a variety of conditions including  hepatitis, metabolic abnormalities, cirrhosis, and hemochromatosis. Electronically Signed   By: Gaylyn Rong M.D.   On: 01/22/2021 10:46    SIGNED: Kendell Bane, MD,  [facility],  Pager (please use [email or web address] to page/text) Please use Epic Secure Chat for non-urgent communication (7AM-7PM)  If 7PM-7AM, please contact night-coverage [email or web address], 01/28/2021, 1:35 PM

## 2021-01-28 NOTE — TOC Progression Note (Signed)
Transition of Care [facility]) - Progression Note    Patient Details  Name: Denise Savage MRN: [id number] Date of Birth: 03-Apr-1961  Transition of Care [facility]) CM/SW Contact  Annice Needy, LCSW Phone Number: 01/28/2021, 4:26 PM  Clinical Narrative:    Eunice Blase at [facility] advised of potential weekend discharge. Eunice Blase indicated that she would start managed Medicaid auth.      Barriers to Discharge: Continued Medical Work up  Expected Discharge Plan and Services                                                 Social Determinants of Health (SDOH) Interventions    Readmission Risk Interventions Readmission Risk Prevention Plan 09/08/2020  Transportation Screening Complete  PCP or Specialist Appt within 5-7 Days Complete  Home Care Screening Complete  Medication Review (RN CM) Complete  Some recent data might be hidden

## 2021-01-29 DIAGNOSIS — K853 Drug induced acute pancreatitis without necrosis or infection: Secondary | ICD-10-CM | POA: Diagnosis not present

## 2021-01-29 DIAGNOSIS — R9389 Abnormal findings on diagnostic imaging of other specified body structures: Secondary | ICD-10-CM | POA: Diagnosis not present

## 2021-01-29 DIAGNOSIS — R569 Unspecified convulsions: Secondary | ICD-10-CM | POA: Diagnosis not present

## 2021-01-29 DIAGNOSIS — I608 Other nontraumatic subarachnoid hemorrhage: Secondary | ICD-10-CM | POA: Diagnosis not present

## 2021-01-29 DIAGNOSIS — R1310 Dysphagia, unspecified: Secondary | ICD-10-CM | POA: Diagnosis not present

## 2021-01-29 DIAGNOSIS — I2699 Other pulmonary embolism without acute cor pulmonale: Secondary | ICD-10-CM | POA: Diagnosis not present

## 2021-01-29 DIAGNOSIS — R9431 Abnormal electrocardiogram [ECG] [EKG]: Secondary | ICD-10-CM | POA: Diagnosis not present

## 2021-01-29 DIAGNOSIS — K859 Acute pancreatitis without necrosis or infection, unspecified: Secondary | ICD-10-CM | POA: Diagnosis not present

## 2021-01-29 LAB — COMPREHENSIVE METABOLIC PANEL
ALT: 13 U/L (ref 0–44)
AST: 37 U/L (ref 15–41)
Albumin: 2 g/dL — ABNORMAL LOW (ref 3.5–5.0)
Alkaline Phosphatase: 56 U/L (ref 38–126)
Anion gap: 7 (ref 5–15)
BUN: 22 mg/dL — ABNORMAL HIGH (ref 6–20)
CO2: 33 mmol/L — ABNORMAL HIGH (ref 22–32)
Calcium: 9.8 mg/dL (ref 8.9–10.3)
Chloride: 98 mmol/L (ref 98–111)
Creatinine, Ser: 0.53 mg/dL (ref 0.44–1.00)
GFR, Estimated: >60 mL/min (ref >60)
Glucose, Bld: 132 mg/dL — ABNORMAL HIGH (ref 70–99)
Potassium: 2.8 mmol/L — ABNORMAL LOW (ref 3.5–5.1)
Sodium: 138 mmol/L (ref 135–145)
Total Bilirubin: 0.3 mg/dL (ref 0.3–1.2)
Total Protein: 6.3 g/dL — ABNORMAL LOW (ref 6.5–8.1)

## 2021-01-29 LAB — GLUCOSE, CAPILLARY
Glucose-Capillary: 97 mg/dL (ref 70–99)
Glucose-Capillary: 90 mg/dL (ref 70–99)
Glucose-Capillary: 106 mg/dL — ABNORMAL HIGH (ref 70–99)
Glucose-Capillary: 81 mg/dL (ref 70–99)
Glucose-Capillary: 87 mg/dL (ref 70–99)

## 2021-01-29 MED ORDER — LACOSAMIDE 50 MG PO TABS
100.0000 mg | ORAL_TABLET | Freq: Two times a day (BID) | ORAL | Status: DC
  Administered 2021-01-29 – 2021-02-02 (×8): 100 mg
  Filled 2021-01-29 (×8): qty 2

## 2021-01-29 MED ORDER — LEVETIRACETAM 100 MG/ML PO SOLN
1000.0000 mg | Freq: Two times a day (BID) | ORAL | Status: DC
  Administered 2021-01-29 – 2021-02-02 (×8): 1000 mg
  Filled 2021-01-29 (×11): qty 10

## 2021-01-29 MED ORDER — OSMOLITE 1.5 CAL PO LIQD
1000.0000 mL | ORAL | Status: DC
  Administered 2021-01-29 – 2021-02-01 (×4): 1000 mL

## 2021-01-29 MED ORDER — FUROSEMIDE 10 MG/ML IJ SOLN
40.0000 mg | Freq: Every day | INTRAMUSCULAR | Status: AC
  Administered 2021-01-29 – 2021-01-30 (×2): 40 mg via INTRAVENOUS
  Filled 2021-01-29 (×2): qty 4

## 2021-01-29 NOTE — Progress Notes (Signed)
[facility] Denise Fielder A. Gerilyn Pilgrim, MD     [email or web address]          Denise Savage is an 60 y.o. female.   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  Depakote will be weaned off by tomorrow. Severe neurological deficits status/  Minimally responsive state - post ruptured aneurysm     GENERAL: The patient is resting in bed. She is markedly edematous/marked anasarca.  HEENT: Large neck; large left temporal craniotomy defect  ABDOMEN: Soft  EXTREMITIES: Marked edema  BACK: Normal alignment.  SKIN: Normal by inspection.    MENTAL STATUS:  She lies in bed with eyes closed. She opens her eyes to light sternal rub and tracks but does not follow commands. No verbal output is noted.  CRANIAL NERVES: Pupils are equal, round and reactive to light; the eyes are tending towards deviation to the left/left gaze preference although she over comes midline about 80% spontaneously. Facial muscle strength is symmetric.  MOTOR:  She localizes the pain involving the left upper extremity.  COORDINATION: No tremors, myoclonus or dysmetria noted.  REFLEXES: Deep tendon reflexes are symmetrical and normal.   SENSATION: Minimal response to deep painful stimuli.       NEURO NOTES 06-2020 - Unclear if episodes of forced gaze deviation are epileptic or not. No eeg changes on scalp eeg during these episodes, mostly noted when patient is awake and patient able to wince and move other left extremities raises suspicion that these are due to underlying injury rather than seizure. Could be due to stimulation induced due to cortical irritability from underlying injury   Recommendations: -Continue Keppra 1500 mg twice daily, Depakote 750 mg every 8 hours, Vimpat 50 mg twice daily    HPI: Denise Savage is a 60 y.o. female who initially presented on  06/15/2020 for headache.  CT head showed small volume acute subarachnoid hemorrhage at the base of the left sylvian fissure. CTA head and neck showed 6 x 6 x 9 mm irregular aneurysm arising from left MCA bifurcation.  Neurosurgery was consulted and patient had coiling done on 06/16/2020 complicated by intraoperative aneurysmal rupture and bleeding.  Was noted to have seizures and therefore started on Keppra followed by Depakote.  However seizures persisted.  Therefore neurology was consulted.   HPI: Denise Savage is a 60 y.o. female who initially presented on 06/15/2020 for headache.  CT head showed small volume acute subarachnoid hemorrhage at the base of the left sylvian fissure. CTA head and neck showed 6 x 6 x 9 mm irregular aneurysm arising from left MCA bifurcation.  Neurosurgery was consulted and patient had coiling done on 06/16/2020 complicated by intraoperative aneurysmal rupture and bleeding.  Was noted to have seizures and therefore started on Keppra followed by Depakote.  However seizures persisted.  Therefore neurology was consulted.    Blood pressure 140/89, pulse 89, temperature 98 F (36.7 C), temperature source Oral, resp. rate 18, height 5\' 4"  (1.626 m), weight 103.2 kg, SpO2 97 %.  Past Medical History:  Diagnosis Date   Abdominal hernia    Dysphagia    Gastrostomy status (HCC)    Hemiplegia and hemiparesis following cerebral infarction affecting right dominant side (HCC)    Pulmonary embolus (HCC)    Seizures (HCC)     Past Surgical History:  Procedure Laterality Date   CRANIOTOMY Left 06/19/2020   Procedure:  CRANIOTOMY HEMATOMA EVACUATION SUBDURAL;  Surgeon: Jadene Pierini, MD;  Location: [facility] OR;  Service: Neurosurgery;  Laterality: Left;   ESOPHAGOGASTRODUODENOSCOPY N/A 07/01/2020   Procedure: ESOPHAGOGASTRODUODENOSCOPY (EGD);  Surgeon: Diamantina Monks, MD;  Location: [facility] ENDOSCOPY;  Service: General;  Laterality: N/A;   IR ANGIO INTRA EXTRACRAN SEL INTERNAL CAROTID BILAT  MOD SED  06/16/2020   IR ANGIO VERTEBRAL SEL VERTEBRAL UNI R MOD SED  06/16/2020   IR ANGIOGRAM FOLLOW UP STUDY  06/16/2020   IR ANGIOGRAM FOLLOW UP STUDY  06/16/2020   IR ANGIOGRAM FOLLOW UP STUDY  06/16/2020   IR CT HEAD LTD  06/16/2020   IR NEURO EACH ADD'L AFTER BASIC UNI RIGHT (MS)  06/16/2020   IR TRANSCATH/EMBOLIZ  06/16/2020   IR US GUIDE VASC ACCESS RIGHT  06/16/2020   PEG PLACEMENT N/A 07/01/2020   Procedure: PERCUTANEOUS ENDOSCOPIC GASTROSTOMY (PEG) PLACEMENT;  Surgeon: Diamantina Monks, MD;  Location: [facility] ENDOSCOPY;  Service: General;  Laterality: N/A;   RADIOLOGY WITH ANESTHESIA N/A 06/16/2020   Procedure: IR WITH ANESTHESIA;  Surgeon: Lisbeth Renshaw, MD;  Location: [facility] OR;  Service: Radiology;  Laterality: N/A;   TUBAL LIGATION     VENTRAL HERNIA REPAIR N/A 12/29/2014   Procedure: LAPAROSCOPIC VENTRAL HERNIA WITH MESH;  Surgeon: Franky Macho, MD;  Location: [facility] ORS;  Service: General;  Laterality: N/A;    Family History  Problem Relation Age of Onset   Pancreatitis Neg Hx     Social History:  reports that she has never smoked. She has never used smokeless tobacco. She reports that she does not drink alcohol and does not use drugs.  Allergies:  Allergies  Allergen Reactions   Hydrocodone Nausea Only    Medications: Prior to Admission medications   Medication Sig Start Date End Date Taking? Authorizing Provider  ELIQUIS 5 MG TABS tablet Take 5 mg by mouth 2 (two) times daily. 01/06/21  Yes [provider]  HYDROcodone-acetaminophen (NORCO) 7.5-325 MG tablet Place 1 tablet into feeding tube every 8 (eight) hours as needed for moderate pain.   Yes [provider]  lacosamide (VIMPAT) 10 MG/ML oral solution Take 5 mLs (50 mg total) by mouth 2 (two) times daily. Patient taking differently: Take 50 mg by mouth 2 (two) times daily. Peg-tube 09/08/20  Yes Costella, Darci Current, PA-C  levETIRAcetam (KEPPRA) 100 MG/ML solution Place 15 mLs (1,500 mg total) into feeding tube 2 (two)  times daily. Patient taking differently: Place 1,500 mg into feeding tube 2 (two) times daily. 900 & 2100 09/08/20  Yes Costella, Darci Current, PA-C  losartan (COZAAR) 25 MG tablet Place 25 mg into feeding tube daily. 01/08/21  Yes [provider]  valproic acid (DEPAKENE) 250 MG/5ML solution Place 15 mLs (750 mg total) into feeding tube every 8 (eight) hours. Patient taking differently: Place 750 mg into feeding tube every 6 (six) hours. 09/08/20  Yes Costella, Darci Current, PA-C  levETIRAcetam (KEPPRA) 500 MG/5ML injection     [provider]  tamsulosin (FLOMAX) 0.4 MG CAPS capsule Take 0.4 mg by mouth daily. Patient not taking: No sig reported 12/25/20   [provider]    Scheduled Meds:  apixaban  5 mg Per Tube BID   feeding supplement (OSMOLITE 1.5 CAL)  1,000 mL Per Tube Q24H   feeding supplement (PROSource TF)  90 mL Per Tube QID   free water  30 mL Per Tube Q4H   furosemide  40 mg Intravenous Daily   lacosamide  100 mg  Per Tube BID   levETIRAcetam  1,000 mg Per Tube BID   pantoprazole sodium  40 mg Per Tube Daily   Continuous Infusions:  valproate sodium 500 mg (01/29/21 1123)   PRN Meds:.acetaminophen **OR** acetaminophen, fentaNYL (SUBLIMAZE) injection     Results for orders placed or performed during the hospital encounter of 01/21/21 (from the past 48 hour(s))  Glucose, capillary     Status: None   Collection Time: 01/27/21  4:25 PM  Result Value Ref Range   Glucose-Capillary 81 70 - 99 mg/dL    Comment: Glucose reference range applies only to samples taken after fasting for at least 8 hours.  Glucose, capillary     Status: Abnormal   Collection Time: 01/27/21  8:36 PM  Result Value Ref Range   Glucose-Capillary 114 (H) 70 - 99 mg/dL    Comment: Glucose reference range applies only to samples taken after fasting for at least 8 hours.  Glucose, capillary     Status: None   Collection Time: 01/28/21 12:42 AM  Result Value Ref Range    Glucose-Capillary 97 70 - 99 mg/dL    Comment: Glucose reference range applies only to samples taken after fasting for at least 8 hours.  Glucose, capillary     Status: None   Collection Time: 01/28/21  5:33 AM  Result Value Ref Range   Glucose-Capillary 91 70 - 99 mg/dL    Comment: Glucose reference range applies only to samples taken after fasting for at least 8 hours.  Glucose, capillary     Status: None   Collection Time: 01/28/21  7:30 AM  Result Value Ref Range   Glucose-Capillary 88 70 - 99 mg/dL    Comment: Glucose reference range applies only to samples taken after fasting for at least 8 hours.  Glucose, capillary     Status: None   Collection Time: 01/28/21 11:09 AM  Result Value Ref Range   Glucose-Capillary 83 70 - 99 mg/dL    Comment: Glucose reference range applies only to samples taken after fasting for at least 8 hours.  Glucose, capillary     Status: None   Collection Time: 01/28/21  5:44 PM  Result Value Ref Range   Glucose-Capillary 75 70 - 99 mg/dL    Comment: Glucose reference range applies only to samples taken after fasting for at least 8 hours.   Comment 1 Notify RN    Comment 2 Document in Chart   Glucose, capillary     Status: None   Collection Time: 01/28/21  8:23 PM  Result Value Ref Range   Glucose-Capillary 82 70 - 99 mg/dL    Comment: Glucose reference range applies only to samples taken after fasting for at least 8 hours.  Glucose, capillary     Status: Abnormal   Collection Time: 01/28/21 11:51 PM  Result Value Ref Range   Glucose-Capillary 123 (H) 70 - 99 mg/dL    Comment: Glucose reference range applies only to samples taken after fasting for at least 8 hours.  Glucose, capillary     Status: None   Collection Time: 01/29/21  5:00 AM  Result Value Ref Range   Glucose-Capillary 97 70 - 99 mg/dL    Comment: Glucose reference range applies only to samples taken after fasting for at least 8 hours.  Glucose, capillary     Status: None   Collection  Time: 01/29/21  7:57 AM  Result Value Ref Range   Glucose-Capillary 90 70 - 99 mg/dL  Comment: Glucose reference range applies only to samples taken after fasting for at least 8 hours.   Comment 1 Notify RN    Comment 2 Document in Chart   Glucose, capillary     Status: Abnormal   Collection Time: 01/29/21 12:07 PM  Result Value Ref Range   Glucose-Capillary 106 (H) 70 - 99 mg/dL    Comment: Glucose reference range applies only to samples taken after fasting for at least 8 hours.   Comment 1 Notify RN    Comment 2 Document in Chart   Comprehensive metabolic panel     Status: Abnormal   Collection Time: 01/29/21 12:37 PM  Result Value Ref Range   Sodium 138 135 - 145 mmol/L   Potassium 2.8 (L) 3.5 - 5.1 mmol/L   Chloride 98 98 - 111 mmol/L   CO2 33 (H) 22 - 32 mmol/L   Glucose, Bld 132 (H) 70 - 99 mg/dL    Comment: Glucose reference range applies only to samples taken after fasting for at least 8 hours.   BUN 22 (H) 6 - 20 mg/dL   Creatinine, Ser 1.19 0.44 - 1.00 mg/dL   Calcium 9.8 8.9 - 14.7 mg/dL   Total Protein 6.3 (L) 6.5 - 8.1 g/dL   Albumin 2.0 (L) 3.5 - 5.0 g/dL   AST 37 15 - 41 U/L   ALT 13 0 - 44 U/L   Alkaline Phosphatase 56 38 - 126 U/L   Total Bilirubin 0.3 0.3 - 1.2 mg/dL   GFR, Estimated >82 >95 mL/min    Comment: (NOTE) Calculated using the CKD-EPI Creatinine Equation (2021)    Anion gap 7 5 - 15    Comment: Performed at [facility], [address], [place], [place] [address]    Studies/Results:   HEAD  CT FINDINGS: Brain: Large area of encephalomalacia involving much of the left cerebral hemisphere, stable. Ex vacuo dilatation of the left lateral ventricle. No acute infarct or hemorrhage.   Vascular: Embolization coils seen in the left middle cerebral artery. No hyperdense vessel.   Skull: Prior large left craniectomy.   Sinuses/Orbits: No acute findings   Other: None   IMPRESSION: Prior left craniectomy with large area of  encephalomalacia throughout much of the left cerebral hemisphere. Ex vacuo dilatation of the left lateral ventricle.   No acute intracranial abnormality.       Vitaliy Eisenhour A. Gerilyn Savage, M.D.  Diplomate, Biomedical engineer of Psychiatry and Neurology ( Neurology). 01/29/2021, 4:00 PM

## 2021-01-29 NOTE — Progress Notes (Signed)
Initial Nutrition Assessment  DOCUMENTATION CODES:   Obesity unspecified  INTERVENTION:  Enteral feeding changed per nursing note. Osmolite 1.5@ 20 ml/hr via PEG tube currently. Recommend consider continue advancing to  55 ml/hr. If pt remains unable to meet nutrition needs - trial of elemental formula may be better tolerated.  ProSource TF 90 ml QID per tube (provides 320 kcal, 88 gr protein).  Free water 150 ml q 4 hrs -if no IV fluids.  NUTRITION DIAGNOSIS:   Inadequate oral intake related to inability to eat as evidenced by NPO status.  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  Patient was noted to develop nausea and vomiting this morning at her SNF, tube feeds were stopped, but she continued to have nausea and vomiting was sent to the ED.  She has never had pancreatitis before per report of her family.  Neurologically, her family reports that she has been making improvements since her ruptured aneurysm in January but is often nonverbal and when she does try to speak, cannot be understood.  Family suspects that she was in some pain today as they noted her blood pressure to be higher than normal.  Patient seemed more somnolent than usual today.  8/15 Patient discussed during rounds. Cleared per MD to continue advancing to goal rate. RD to adjust. Discussed with nursing- patient tolerating nutrition at current rate. Hypoglycemia this morning. D5% being resumed. Last BM unknown??  8/16 Talked with nursing bedside. Tube feeding titrated down around 1000 this morning to 20  ml/hr due to increased lipase. Providing 576 kcal, 27 gr protein and 394 ml water. Meeting 30% energy, 26% protein needs. Question if pt may be candidate for Creon via PEG?    8/17 Patient receiving tube feeding at 20 ml/hr and protein modular BID. D5% supporting fluid intake and glucose stability. Recommend advance feeding due to her hypocaloric, limited protein intake. Will increase protein modular to QID.   8/19 Pt tolerating tube feeding at 20 ml/hr. Son bedside and pt has her eyes open this morning. Continue advancing tube feeding to goal of 55 ml/hr as tolerated. Will adjust protein modular as tube feeds are advanced per MD. At current rate of 20 ml hr new formula provides 720 kcal, 26.6 gr protein and 394 ml water. Protein modular adds-320 kcal, 88 gr protein.   Medications reviewed and include: Reviewed.   Labs: BMP Latest Ref Rng & Units 01/27/2021 01/26/2021 01/24/2021  Glucose 70 - 99 mg/dL 403(K) 742(V) 95(G)  BUN 6 - 20 mg/dL 10 11 18   Creatinine 0.44 - 1.00 mg/dL ) 3.87(F) 6.43(P  Sodium 135 - 145 mmol/L 138 136 136  Potassium 3.5 - 5.1 mmol/L 3.4(L) 3.1(L) 4.2  Chloride 98 - 111 mmol/L 104 103 106  CO2 22 - 32 mmol/L 30 27 23   Calcium 8.9 - 10.3 mg/dL 9.5 9.4 9.4      Diet Order:   Diet Order             Diet NPO time specified  Diet effective now  EDUCATION NEEDS:   Not appropriate for education at this time  Skin:  Skin Assessment: Reviewed RN Assessment (PEG tube in place)  Last BM:  8/19  Height:   Ht Readings from Last 1 Encounters:  01/21/21 5\' 4"  (1.626 m)    Weight:   Wt Readings from Last 1 Encounters:  01/29/21 103.2 kg    Ideal Body Weight:   55 kg  BMI:  Body mass index is 39.05 kg/m.  Estimated Nutritional Needs:   Kcal:  1900-2100  Protein:  105-120 grams  Fluid:  >1.9 L   01/31/21 MS,RD,CSG,LDN Contact: AMION

## 2021-01-29 NOTE — Progress Notes (Signed)
PROGRESS NOTE    Patient: Denise Savage                            PCP: Charlynne Pander, MD                    DOB: 05-Jan-1961            DOA: 01/21/2021 ERD:[id number]             DOS: 01/29/2021, 12:18 PM   LOS: 8 days   Date of Service: The patient was seen and examined on 01/29/2021  Subjective:   The patient was seen and examined this morning, awake tracking with her eyes otherwise not interactive nonverbal..  Not moving her extremities,... Seems to be at baseline   Patient son present at bedside, findings were discussed with the plan to taper down Depakote, increase Vimpat, continue Keppra.Marland Kitchen  Optimize tube feeding With plan to discharge back to facility in 1 to 2 days   Brief Narrative:   Denise Savage is a 60 y.o. 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  Patient was noted to develop nausea and vomiting this morning at her SNF, tube feeds were stopped, but she continued to have nausea and vomiting was sent to the ED.  She has never had pancreatitis before per report of her family.  Neurologically, her family reports that she has been making improvements since her ruptured aneurysm in January but is often nonverbal and when she does try to speak, cannot be understood.  Family suspects that she was in some pain today as they noted her blood pressure to be higher than normal.  Patient seemed more somnolent than usual today.    ED Course: Upon arrival to the ED, patient is found to be afebrile, saturating well on room air, and with stable blood pressure.  EKG features sinus rhythm with QTc interval of 635 ms.  Chemistry panel notable for elevated BUN to creatinine ratio.  CBC with mild polycythemia and slight thrombocytopenia.  Lactic acid normal.   Lipase 643.  Head CT negative for acute findings.  CT of the abdomen and pelvis concerning for acute pancreatitis without organized fluid collection or necrosis.  Patient was given IV fluids and Zofran in the ED.    Assessment & Plan:   Principal Problem:   Acute pancreatitis Active Problems:   Aneurysmal subarachnoid hemorrhage (HCC)   Seizures (HCC)   Pulmonary embolus (HCC)   Dysphagia   Prolonged QT interval   Thickened endometrium    1. Acute pancreatitis /gastro duodenitis -Resolved,  - Lipase of 643 >>> 511 >>144 >>123>213>>326 - CT findings consistent with acute pancreatitis as well as gastritis and duodenitis - LFTs normal, no biliary dilation on CT  -Right upper quadrant ultrasound without cholelithiasis or cholecystitis findings - Triglycerides 91, lactic acid 1.9 - continue to hold tube feeds, (discontinue subsequent IV fluids)  -Patient was restarted on tube feed on 01/24/2021, (improved lipase level)   -Discussed with GI service--to optimize nutrition and tube feeds -treatment for pancreatitis   -cont. Tube Feeding with Osmolite - ProSource TF 45 ml BID per tube -Water flushes 150 mL every 4 hours -Continue PPI -Remained stable  2. Prolonged QT interval  -Much improved -  QTc is 635 ms in ED  -Repeat EKG on 01/23/2021 with QTC less than 350 -Keep magnesium close to 2 and keep potassium close to 4   3. PE   - Admitted 11/18/20 with small RLL PE without heart strain and was discharged on Eliquis  - Continue Eliquis   - stable    4. Seizures--status post prior craniectomy -very high risk for seizures, status post craniectomy, patient's Skull is currently in her left abdominal area, plan is to put her skull  back at a later date -PTA patient is on Vimpat, Keppra and Depakote (during hospitalization medications were given IV_ Switching back to p.o. -Neurologist consulted, with plan to taper off Depakote increase Vimpat continue Keppra Plan for titration  over the next 2 days    5. 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 Endometrial thickening and possible fibroid - Noted incidentally on CT, non-emergent [place] recommended  -Discussed with patient's son Denise Savage,  -Treatment as above   7) hypoglycemia due to n.p.o. status--- resolved as tube feeds resumed   8) hypercalcemia--- episodes of hypercalcemia from time to time usually improves with hydration  Disposition--- discharge back to SNF when tolerating tube feed well  ----------------------------------------------------------------------------------------------------------------------------------------------- Nutritional status:  The patient's BMI is: Body mass index is 39.05 kg/m. I agree with the assessment and plan as outlined below: Nutrition Status: Nutrition Problem: Inadequate oral intake Etiology: inability to eat Signs/Symptoms: NPO status Interventions: Tube feeding, Prostat ------------------------------------------------------------------------------------------------------------------------------------------------ Cultures / Antimicrobials: none    Consultants: None  ------------------------------------------------------------------------------------------------------------------------------------------------  DVT prophylaxis:  SCD/Compression stockings and TM:LYYTKPT Code Status:   Code Status: Full Code  Family Communication: Discussed with her son Denise Savage at bedside  Called her daughter 01/28/2021 left a message  Admission status:   Status is: Inpatient  Remains inpatient appropriate because:IV treatments appropriate due to intensity of illness or inability to take PO and Inpatient level of  care appropriate due to severity of illness  Dispo: The patient is from: SNF              Anticipated d/c is to: SNF 1-2 days - discharge back to SNF when tolerating tube feed well-seizure meds titrated              Patient currently is not medically stable to d/c.   Difficult to place patient No    Level of care: Med-Surg   Procedures:   No admission procedures for hospital encounter.    Antimicrobials:  None    Medication:   apixaban  5 mg Per Tube BID   feeding supplement (OSMOLITE 1.2 CAL)  1,000 mL Per Tube Q24H   feeding supplement (PROSource TF)  90 mL Per Tube QID   free water  30 mL Per Tube Q4H   furosemide  40 mg Intravenous Daily   lacosamide  100 mg Per Tube BID   levETIRAcetam  1,000 mg Per Tube BID   pantoprazole sodium  40 mg Per Tube Daily    acetaminophen **OR** acetaminophen, fentaNYL (SUBLIMAZE) injection   Objective:   Vitals:   01/28/21 1503 01/28/21 2143 01/29/21 0500 01/29/21 0504  BP: (!) 145/87 94/62  131/90  Pulse: 87 97  97  Resp: 20 18  17   Temp: 98.7 F (37.1 C) 98.9 F (37.2 C)  98.4 F (36.9 C)  TempSrc: Oral Oral  Oral  SpO2: 100% 95%  96%  Weight:   103.2 kg   Height:        Intake/Output Summary (Last 24 hours) at 01/29/2021 1218 Last data filed at 01/28/2021 1900 Gross per 24 hour  Intake 0 ml  Output 1300 ml  Net -1300 ml   Filed Weights   01/28/21 0500 01/28/21 0556 01/29/21 0500  Weight: 102.2 kg 102.2 kg 103.2 kg     Examination:        Physical Exam:   General:  Alert, nonverbal, minimally interactive, bedbound  HEENT:  Left skull absent, soft tissue, PERRL, otherwise with in Normal limits   Neuro:  Limited exam patient is noninteractive CNII-XII intact. ,  Minimal motor movement in extremities  Lungs:   Clear to auscultation BL, Respirations unlabored, no wheezes / crackles  Cardio:    S1/S2, RRR, No murmure, No Rubs or Gallops   Abdomen:   Soft, non-tender, bowel sounds active all four quadrants,   no guarding or peritoneal signs. Understand left abdomen skeletal present, PEG tube present functional  Muscular skeletal:  Bedbound, severe generalized weaknesses, not moving extremities  Limited exam - 2+ pulses,  +2 pitting edema  Skin:  Dry, warm to touch, negative for any Rashes,  Wounds: Please see nursing documentation           ------------------------------------------------------------------------------------------------------------------------------------------    LABs:  CBC Latest Ref Rng & Units 01/26/2021 01/24/2021 01/23/2021  WBC 4.0 - 10.5 K/uL 9.5 20.8(H) 24.7(H)  Hemoglobin 12.0 - 15.0 g/dL 11.3(L) 12.5 15.0  Hematocrit 36.0 - 46.0 % 35.5(L) 40.2 49.3(H)  Platelets 150 - 400 K/uL 163 151 135(L)   CMP Latest Ref Rng & Units 01/27/2021 01/26/2021 01/24/2021  Glucose 70 - 99 mg/dL 294(T) 654(Y) 50(P)  BUN 6 - 20 mg/dL 10 11 18   Creatinine 0.44 - 1.00 mg/dL 5.46(F) 6.81(E) 7.51  Sodium 135 - 145 mmol/L 138 136 136  Potassium 3.5 - 5.1 mmol/L 3.4(L) 3.1(L) 4.2  Chloride 98 - 111 mmol/L 104 103 106  CO2 22 - 32 mmol/L 30 27 23   Calcium 8.9 - 10.3 mg/dL 9.5 9.4 9.4  Total Protein 6.5 - 8.1 g/dL 5.3(L) 5.3(L) -  Total Bilirubin 0.3 - 1.2 mg/dL 0.5 0.4 -  Alkaline Phos 38 - 126 U/L 47 48 -  AST 15 - 41 U/L 34 25 -  ALT 0 - 44 U/L 10 9 -     Micro Results Recent Results (from the past 240 hour(s))  Resp Panel by RT-PCR (Flu A&B, Covid) Nasopharyngeal Swab     Status: None   Collection Time: 01/21/21  6:34 PM   Specimen: Nasopharyngeal Swab; Nasopharyngeal(NP) swabs in vial transport medium  Result Value Ref Range Status   SARS Coronavirus 2 by RT PCR NEGATIVE NEGATIVE Final    Comment: (NOTE) SARS-CoV-2 target nucleic acids are NOT DETECTED.  The SARS-CoV-2 RNA is generally detectable in upper respiratory specimens during the acute phase of infection. The lowest concentration of SARS-CoV-2 viral copies this assay can detect is 138 copies/mL. A negative result  does not preclude SARS-Cov-2 infection and should not be used as the sole basis for treatment or other patient management decisions. A negative result may occur with  improper specimen collection/handling, submission of specimen other than nasopharyngeal swab, presence of viral mutation(s) within the areas targeted by this assay, and inadequate number of viral copies(<138 copies/mL). A negative result must be combined with clinical observations,  patient history, and epidemiological information. The expected result is Negative.  Fact Sheet for Patients:  BloggerCourse.com  Fact Sheet for Healthcare Providers:  SeriousBroker.it  This test is no t yet approved or cleared by the [place] FDA and  has been authorized for detection and/or diagnosis of SARS-CoV-2 by FDA under an Emergency Use Authorization (EUA). This EUA will remain  in effect (meaning this test can be used) for the duration of the COVID-19 declaration under Section 564(b)(1) of the Act, 21 U.S.C.section 360bbb-3(b)(1), unless the authorization is terminated  or revoked sooner.       Influenza A by PCR NEGATIVE NEGATIVE Final   Influenza B by PCR NEGATIVE NEGATIVE Final    Comment: (NOTE) The Xpert Xpress SARS-CoV-2/FLU/RSV plus assay is intended as an aid in the diagnosis of influenza from Nasopharyngeal swab specimens and should not be used as a sole basis for treatment. Nasal washings and aspirates are unacceptable for Xpert Xpress SARS-CoV-2/FLU/RSV testing.  Fact Sheet for Patients: BloggerCourse.com  Fact Sheet for Healthcare Providers: SeriousBroker.it  This test is not yet approved or cleared by the [place] FDA and has been authorized for detection and/or diagnosis of SARS-CoV-2 by FDA under an Emergency Use Authorization (EUA). This EUA will remain in effect (meaning this test can be used) for  the duration of the COVID-19 declaration under Section 564(b)(1) of the Act, 21 U.S.C. section 360bbb-3(b)(1), unless the authorization is terminated or revoked.  Performed at [facility], [address], [place], [place] [address]     Radiology Reports CT Head Wo Contrast  Result Date: 01/21/2021 CLINICAL DATA:  Mental status change, unknown cause EXAM: CT HEAD WITHOUT CONTRAST TECHNIQUE: Contiguous axial images were obtained from the base of the skull through the vertex without intravenous contrast. 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 Ex vacuo dilatation of the left lateral ventricle. No acute infarct or hemorrhage. Vascular: Embolization coils seen in the left middle cerebral artery. No hyperdense vessel. Skull: Prior large left craniectomy. Sinuses/Orbits: No acute findings Other: None IMPRESSION: Prior left craniectomy with large area of encephalomalacia throughout much of the left cerebral hemisphere. Ex vacuo dilatation of the left lateral ventricle. No acute intracranial abnormality. Electronically Signed   By: Charlett Nose M.D.   On: 01/21/2021 19:38   CT Abdomen Pelvis W Contrast  Result Date: 01/21/2021 CLINICAL DATA:  Vomiting peg tube EXAM: CT ABDOMEN AND PELVIS WITH CONTRAST TECHNIQUE: Multidetector CT imaging of the abdomen and pelvis was performed using the standard protocol following bolus administration of intravenous contrast. CONTRAST:  91mL OMNIPAQUE IOHEXOL 350 MG/ML SOLN COMPARISON:  None. FINDINGS: Lower chest: Lung bases demonstrate partial lower lobe consolidation which may reflect atelectasis or aspiration. No pleural effusion. Cardiomegaly. Hepatobiliary: Subcentimeter hypodense lesion posterior right hepatic lobe too small to further characterize. No calcified gallstone or biliary dilatation Pancreas: Pancreatic enlargement with marked peripancreatic inflammatory change and fluid  consistent with acute pancreatitis. No organized fluid collections. Homogeneous enhancement. Spleen: Normal in size without focal abnormality. Adrenals/Urinary Tract: Adrenal glands are normal. Cyst lower pole left kidney. Small bilateral kidney stones. The bladder is thick walled and indistinct. Stomach/Bowel: Stomach nonenlarged. Mild wall thickening and mucosal enhancement of distal stomach and first portion of duodenum. No dilated small bowel. Hyperdense material in the colon. No acute bowel wall thickening. Gastrostomy tube in the distal body of the stomach. Vascular/Lymphatic: Nonaneurysmal aorta with mild atherosclerosis. No suspicious lymph nodes. Portal and splenic veins  normally enhance. Reproductive: Prominent size uterus. Endometrial stripe slightly thickened up to 11 mm with focal hypodense area in the posterior lower uterine segment questionable for fibroid. No adnexal mass Other: No free air. Small free fluid in the abdomen and pelvis. 11 by 12 by 9.4 cm suspected foreign body within the subcutaneous fat of the left anterior abdominal wall with small adjacent fluid. This is seen at the level of the umbilicus. Evidence of prior ventral hernia repair with small fat containing recurrent ventral hernia. Musculoskeletal: No acute osseous abnormality IMPRESSION: 1. Findings consistent with acute pancreatitis. No organized fluid collection or evidence for necrosis at this time. Small amount of abdominopelvic ascites 2. Gastrostomy tube present in the distal body of stomach. Mild wall thickening and mucosal enhancement of distal stomach and first portion of duodenum likely reflecting gastritis/duodenitis versus reactive inflammation from adjacent inflamed pancreas 3. 11 x 12 x 9.4 cm suspected foreign body within the subcutaneous fat of the left anterior body wall at the level of the umbilicus. Small amount of fluid surrounding the foreign body. 4. Cardiomegaly. Patchy atelectasis or aspiration changes at the  bases. 5. Thickened endometrial stripe with slightly enlarged appearing uterus for age. Possible hypoechoic mass within the posterior lower uterine segment. Recommend correlation with nonemergent ultrasound. Electronically Signed   By: Jasmine PangKim  Fujinaga M.D.   On: 01/21/2021 19:47   DG Chest Port 1 View  Result Date: 01/21/2021 CLINICAL DATA:  Altered mental status. EXAM: PORTABLE CHEST 1 VIEW COMPARISON:  July 15, 2020. FINDINGS: Stable cardiomediastinal silhouette. Tracheostomy tube has been removed. Both lungs are clear. The visualized skeletal structures are unremarkable. IMPRESSION: No active disease. Electronically Signed   By: Lupita RaiderJames  Green Jr M.D.   On: 01/21/2021 16:27   US Abdomen Limited RUQ (LIVER/GB)  Result Date: 01/22/2021 CLINICAL DATA:  Acute pancreatitis EXAM: ULTRASOUND ABDOMEN LIMITED RIGHT UPPER QUADRANT COMPARISON:  CT abdomen 01/21/2021 FINDINGS: Gallbladder: Gallbladder wall is within normal limits at 0.2 cm. No gallstones or sonographic Murphy sign identified. Common bile duct: Diameter: 0.3 cm Liver: Mildly coarsened echotexture, without substantial focal lesion identified. Portal vein is patent on color Doppler imaging with normal direction of blood flow towards the liver. Other: Trace perihepatic ascites. IMPRESSION: 1. No findings of gallstones or biliary dilatation. 2. Trace perihepatic ascites. 3. Subtle coarsening of the echotexture of the liver is nonspecific but can be seen in a variety of conditions including hepatitis, metabolic abnormalities, cirrhosis, and hemochromatosis. Electronically Signed   By: Gaylyn RongWalter  Liebkemann M.D.   On: 01/22/2021 10:46    SIGNED: Kendell BaneSeyed A Greysen Devino, MD,  [facility],  Pager (please use [email or web address] to page/text) Please use Epic Secure Chat for non-urgent communication (7AM-7PM)  If 7PM-7AM, please contact night-coverage [email or web address], 01/29/2021, 12:18 PM

## 2021-01-29 NOTE — Progress Notes (Signed)
Denise Savage, M.D. Gastroenterology & Hepatology   Interval History:  No acute vents overnight. Patient has been doing well.  Nursing staff reports that he has been doing her current rate of tube feeds 20 cc/h.  Has not presented any nausea or vomiting.  Patient is nonverbal but has not been grimacing and has been moving her bowels. Liver enzymes have remained stable without any significant increase.  Inpatient Medications:  Current Facility-Administered Medications:    acetaminophen (TYLENOL) tablet 650 mg, 650 mg, Per Tube, Q6H PRN **OR** acetaminophen (TYLENOL) suppository 650 mg, 650 mg, Rectal, Q6H PRN, Opyd, Ilene Qua, MD   apixaban (ELIQUIS) tablet 5 mg, 5 mg, Per Tube, BID, Opyd, Ilene Qua, MD, 5 mg at 01/29/21 1123   feeding supplement (OSMOLITE 1.5 CAL) liquid 1,000 mL, 1,000 mL, Per Tube, Q24H, Shahmehdi, Seyed A, MD, Last Rate: 20 mL/hr at 01/29/21 1446, 1,000 mL at 01/29/21 1446   feeding supplement (PROSource TF) liquid 90 mL, 90 mL, Per Tube, QID, Shahmehdi, Seyed A, MD, 90 mL at 01/29/21 1437   fentaNYL (SUBLIMAZE) injection 12.5-50 mcg, 12.5-50 mcg, Intravenous, Q2H PRN, Opyd, Ilene Qua, MD   free water 30 mL, 30 mL, Per Tube, Q4H, Emokpae, Courage, MD, 30 mL at 01/29/21 1124   furosemide (LASIX) injection 40 mg, 40 mg, Intravenous, Daily, Shahmehdi, Seyed A, MD, 40 mg at 01/29/21 1124   lacosamide (VIMPAT) tablet 100 mg, 100 mg, Per Tube, BID, Shahmehdi, Seyed A, MD   levETIRAcetam (KEPPRA) 100 MG/ML solution 1,000 mg, 1,000 mg, Per Tube, BID, Shahmehdi, Seyed A, MD   pantoprazole sodium (PROTONIX) 40 mg/20 mL oral suspension 40 mg, 40 mg, Per Tube, Daily, Emokpae, Courage, MD, 40 mg at 01/29/21 1123   valproate (DEPACON) 500 mg in dextrose 5 % 50 mL IVPB, 500 mg, Intravenous, Q12H, [place], Kofi, MD, Last Rate: 55 mL/hr at 01/29/21 1123, 500 mg at 01/29/21 1123   I/O    Intake/Output Summary (Last 24 hours) at 01/29/2021 1533 Last data filed at 01/29/2021 1300 Gross  per 24 hour  Intake 0 ml  Output 1300 ml  Net -1300 ml     Physical Exam: Temp:  [98 F (36.7 C)-98.9 F (37.2 C)] 98 F (36.7 C) (08/19 1342) Pulse Rate:  [89-97] 89 (08/19 1342) Resp:  [17-18] 18 (08/19 1342) BP: (94-140)/(62-90) 140/89 (08/19 1342) SpO2:  [95 %-97 %] 97 % (08/19 1342) Weight:  [103.2 kg] 103.2 kg (08/19 0500)  Temp (24hrs), Avg:98.4 F (36.9 C), Min:98 F (36.7 C), Max:98.9 F (37.2 C) General:   Resting comfortably in bed, no acute distress, opens her eyes, but does not make eye contact.   Head:  Normocephalic and atraumatic. Eyes:  Sclera clear, no icterus.   Conjunctiva pink. Lungs:  Clear throughout to auscultation anteriorly.   No wheezes, crackles, or rhonchi. No acute distress. Heart:  Regular rate and rhythm; no murmurs, clicks, rubs,  or gallops. Abdomen: Normoactive bowel sounds. Obese abdomen, soft, no appreciable tenderness/grimacing upon palpation.  Scar in the left abdomen with associated firmness in this area. No masses, hepatosplenomegaly or hernias noted.  Rectal:  Deferred  Msk:  Symmetrical without gross deformities. Normal posture. Extremities:  With 2+ pitting edema up to knees.  Also with pitting edema in her arms bilaterally. Neurologic:  Alert.  Unable to assess orientation due to patient being nonverbal. Skin:  Intact without significant lesions or rashes. Psych:  Flat affect.   Laboratory Data: CBC:     Component Value Date/Time  WBC 9.5 01/26/2021 0336   RBC 3.77 (L) 01/26/2021 0336   HGB 11.3 (L) 01/26/2021 0336   HCT 35.5 (L) 01/26/2021 0336   PLT 163 01/26/2021 0336   MCV 94.2 01/26/2021 0336   MCH 30.0 01/26/2021 0336   MCHC 31.8 01/26/2021 0336   RDW 16.4 (H) 01/26/2021 0336   LYMPHSABS 0.7 01/21/2021 1724   MONOABS 1.1 (H) 01/21/2021 1724   EOSABS 0.0 01/21/2021 1724   BASOSABS 0.0 01/21/2021 1724   COAG:  Lab Results  Component Value Date   INR 1.1 06/30/2020   INR 1.1 06/16/2020    BMP:  BMP Latest Ref  Rng & Units 01/29/2021 01/27/2021 01/26/2021  Glucose 70 - 99 mg/dL 132(H) 108(H) 151(H)  BUN 6 - 20 mg/dL 22(H) 10 11  Creatinine 0.44 - 1.00 mg/dL 0.53 0.37(L) 0.41(L)  Sodium 135 - 145 mmol/L 138 138 136  Potassium 3.5 - 5.1 mmol/L 2.8(L) 3.4(L) 3.1(L)  Chloride 98 - 111 mmol/L 98 104 103  CO2 22 - 32 mmol/L 33(H) 30 27  Calcium 8.9 - 10.3 mg/dL 9.8 9.5 9.4    HEPATIC:  Hepatic Function Latest Ref Rng & Units 01/29/2021 01/27/2021 01/26/2021  Total Protein 6.5 - 8.1 g/dL 6.3(L) 5.3(L) 5.3(L)  Albumin 3.5 - 5.0 g/dL 2.0(L) 1.7(L) 1.7(L)  AST 15 - 41 U/L 37 34 25  ALT 0 - 44 U/L _0 Alk Phosphatase 38 - 126 U/L 56 47 48  Total Bilirubin 0.3 - 1.2 mg/dL 0.3 0.5 0.4  Bilirubin, Direct 0.0 - 0.2 mg/dL - - -    CARDIAC:  Lab Results  Component Value Date   TROPONINI <0.30 08/11/2013      Imaging: I personally reviewed and interpreted the available labs, imaging and endoscopic files.   Assessment/Plan: 60 year old female with past medical history of ruptured intracranial aneurysm in January 2022 with significant complications characterized by residual hemiparesis, aphasia and dysphagia status post PEG tube placement, seizure disorder and history of PE, who was admitted to the hospital after presenting with cute onset of nausea and vomiting with blood from [facility] on 5/92/9244.  At that time the patient presented new onset of vomiting for which she was brought to the ED but the patient was aphasic and it was difficult to obtain any history.  Labs upon admission were remarkable for mildly elevated calcium of 10.6, lipase was elevated at 643.  Due to this she underwent a CT of the abdomen and pelvis which showed findings consistent with acute pancreatitis without any collections or necrosis. There was some concern regarding lipase elevation when tube feeds were started but clinically asymptomatic.  As patient has tolerated feeds well and has not presented any worsening symptoms, would  recommend holding off on checking this lab unless clinically symptomatic. Most likely etiology of pancreatitis is valproate.  - Consider switching valproate to other antiepilectic - Advance tube feeds to goal as tolerated  - Hold off on checking lipase unless clinically symptomatic - GI service will sign-off, please call us back if you have any more questions.  Denise Peppers, MD Gastroenterology and Hepatology [facility]

## 2021-01-30 DIAGNOSIS — I608 Other nontraumatic subarachnoid hemorrhage: Secondary | ICD-10-CM | POA: Diagnosis not present

## 2021-01-30 DIAGNOSIS — I2699 Other pulmonary embolism without acute cor pulmonale: Secondary | ICD-10-CM | POA: Diagnosis not present

## 2021-01-30 DIAGNOSIS — R1310 Dysphagia, unspecified: Secondary | ICD-10-CM | POA: Diagnosis not present

## 2021-01-30 DIAGNOSIS — R9389 Abnormal findings on diagnostic imaging of other specified body structures: Secondary | ICD-10-CM | POA: Diagnosis not present

## 2021-01-30 DIAGNOSIS — R569 Unspecified convulsions: Secondary | ICD-10-CM | POA: Diagnosis not present

## 2021-01-30 DIAGNOSIS — R9431 Abnormal electrocardiogram [ECG] [EKG]: Secondary | ICD-10-CM | POA: Diagnosis not present

## 2021-01-30 DIAGNOSIS — K853 Drug induced acute pancreatitis without necrosis or infection: Secondary | ICD-10-CM | POA: Diagnosis not present

## 2021-01-30 DIAGNOSIS — K859 Acute pancreatitis without necrosis or infection, unspecified: Secondary | ICD-10-CM | POA: Diagnosis not present

## 2021-01-30 LAB — COMPREHENSIVE METABOLIC PANEL
ALT: 11 U/L (ref 0–44)
AST: 35 U/L (ref 15–41)
Albumin: 2 g/dL — ABNORMAL LOW (ref 3.5–5.0)
Alkaline Phosphatase: 56 U/L (ref 38–126)
Anion gap: 6 (ref 5–15)
BUN: 24 mg/dL — ABNORMAL HIGH (ref 6–20)
CO2: 35 mmol/L — ABNORMAL HIGH (ref 22–32)
Calcium: 9.5 mg/dL (ref 8.9–10.3)
Chloride: 99 mmol/L (ref 98–111)
Creatinine, Ser: 0.48 mg/dL (ref 0.44–1.00)
GFR, Estimated: >60 mL/min (ref >60)
Glucose, Bld: 121 mg/dL — ABNORMAL HIGH (ref 70–99)
Potassium: 2.7 mmol/L — CL (ref 3.5–5.1)
Sodium: 140 mmol/L (ref 135–145)
Total Bilirubin: 0.4 mg/dL (ref 0.3–1.2)
Total Protein: 6.2 g/dL — ABNORMAL LOW (ref 6.5–8.1)

## 2021-01-30 LAB — GLUCOSE, CAPILLARY
Glucose-Capillary: 102 mg/dL — ABNORMAL HIGH (ref 70–99)
Glucose-Capillary: 118 mg/dL — ABNORMAL HIGH (ref 70–99)
Glucose-Capillary: 120 mg/dL — ABNORMAL HIGH (ref 70–99)
Glucose-Capillary: 123 mg/dL — ABNORMAL HIGH (ref 70–99)
Glucose-Capillary: 87 mg/dL (ref 70–99)
Glucose-Capillary: 94 mg/dL (ref 70–99)

## 2021-01-30 MED ORDER — POTASSIUM CHLORIDE 20 MEQ PO PACK
20.0000 meq | PACK | Freq: Two times a day (BID) | ORAL | Status: DC
  Administered 2021-01-30: 20 meq via ORAL
  Filled 2021-01-30: qty 1

## 2021-01-30 NOTE — Progress Notes (Signed)
PROGRESS NOTE    Patient: Denise Savage                            PCP: Charlynne Pander, MD                    DOB: Jun 12, 1961            DOA: 01/21/2021 [id number]:[id number]             DOS: 01/30/2021, 1:52 PM   LOS: 9 days   Date of Service: The patient was seen and examined on 01/30/2021  Subjective:   The patient was seen and examined this morning, awake tracking with her eyes otherwise not interactive nonverbal..  Not moving her extremities,... Seems to be at baseline   Patient son present at bedside, findings were discussed with the plan to taper down Depakote, increase Vimpat, continue Keppra.Marland Kitchen  Optimize tube feeding With plan to discharge back to facility in 1 to 2 days   Brief Narrative:   Denise Savage is a 60 y.o. 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  Patient was noted to develop nausea and vomiting this morning at her SNF, tube feeds were stopped, but she continued to have nausea and vomiting was sent to the ED.  She has never had pancreatitis before per report of her family.  Neurologically, her family reports that she has been making improvements since her ruptured aneurysm in January but is often nonverbal and when she does try to speak, cannot be understood.  Family suspects that she was in some pain today as they noted her blood pressure to be higher than normal.  Patient seemed more somnolent than usual today.    ED Course: Upon arrival to the ED, patient is found to be afebrile, saturating well on room air, and with stable blood pressure.  EKG features sinus rhythm with QTc interval of 635 ms.  Chemistry panel notable for elevated BUN to creatinine ratio.  CBC with mild polycythemia and slight thrombocytopenia.  Lactic acid normal.   Lipase 643.  Head CT negative for acute findings.  CT of the abdomen and pelvis concerning for acute pancreatitis without organized fluid collection or necrosis.  Patient was given IV fluids and Zofran in the ED.    Assessment & Plan:   Principal Problem:   Acute pancreatitis Active Problems:   Aneurysmal subarachnoid hemorrhage (HCC)   Seizures (HCC)   Pulmonary embolus (HCC)   Dysphagia   Prolonged QT interval   Thickened endometrium    1. Acute pancreatitis /gastro duodenitis -Resolved  - Lipase of 643 >>> 326 - CT findings consistent with acute pancreatitis as well as gastritis and duodenitis - LFTs normal, no biliary dilation on CT  -Right upper quadrant ultrasound without cholelithiasis or cholecystitis findings - Triglycerides 91, lactic acid 1.9 -  -Patient was restarted on tube feed on 01/24/2021, (improved lipase level) -Discussed with GI service--optimizing tube feeds   -cont. Tube Feeding with Osmolite - ProSource TF 45 ml BID per tube -Water flushes 150 mL every 4 hours -Continue PPI -Remained stable  2. Prolonged QT interval  -Much improved - QTc is 635 ms in ED  -Repeat EKG on 01/23/2021 with QTC less than 350 -Keep magnesium  close to 2 and keep potassium close to 4   3. PE   - Admitted 11/18/20 with small RLL PE without heart strain and was discharged on Eliquis  - Continue Eliquis      4. Seizures--status post prior craniectomy -very high risk for seizures, status post craniectomy, patient's Skull is currently in her left abdominal area, plan is to put her skull  back at a later date -PTA patient is on Vimpat, Keppra and Depakote (during hospitalization medications were given IV_ Switching back to PEG tube -Neurologist consulted: It was tapered off Depakote  - increase Vimpat, and continue Keppra via PEG tube -Stable    5. 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 Endometrial thickening and possible fibroid - Noted incidentally on CT, non-emergent US recommended  -Discussed with patient's son Denise Savage,  -Treatment as above   7) hypoglycemia due to n.p.o. status--- resolved as tube feeds resumed   8) hypercalcemia--- episodes of hypercalcemia from time to time usually improves with hydration  9) Hypokalemia -monitoring and replete accordingly   disposition--- discharge back to SNF when tolerating tube feed well  ----------------------------------------------------------------------------------------------------------------------------------------------- Nutritional status:  The patient's BMI is: Body mass index is 37.92 kg/m. I agree with the assessment and plan as outlined below: Nutrition Status: Nutrition Problem: Inadequate oral intake Etiology: inability to eat Signs/Symptoms: NPO status Interventions: Tube feeding, Prostat ------------------------------------------------------------------------------------------------------------------------------------------------ Cultures / Antimicrobials: none    Consultants: None  ------------------------------------------------------------------------------------------------------------------------------------------------  DVT prophylaxis:  SCD/Compression stockings and ZO:XWRUEAVOn:Eliquis Code Status:   Code Status: Full Code  Family Communication: Discussed with her son Denise Savage at bedside  Called her daughter 01/28/2021 left a message  Admission status:   Status is: Inpatient  Remains inpatient appropriate because:IV treatments appropriate due to intensity of illness or inability to take PO and Inpatient level of care appropriate due to severity of illness  Dispo: The patient is from: SNF               Anticipated d/c is to: SNF 1-2 days - discharge back to SNF when tolerating tube feed well-seizure meds titrated              Patient currently is not medically stable to d/c.   Difficult to place patient No    Level of care: Med-Surg   Procedures:   No admission procedures for hospital encounter.    Antimicrobials:  None    Medication:   apixaban  5 mg Per Tube BID   feeding supplement (OSMOLITE 1.5 CAL)  1,000 mL Per Tube Q24H   feeding supplement (PROSource TF)  90 mL Per Tube QID   free water  30 mL Per Tube Q4H   lacosamide  100 mg Per Tube BID   levETIRAcetam  1,000 mg Per Tube BID   pantoprazole sodium  40 mg Per Tube Daily   potassium chloride  20 mEq Oral BID    acetaminophen **OR** acetaminophen, fentaNYL (SUBLIMAZE) injection   Objective:   Vitals:   01/29/21 1342 01/29/21 2008 01/30/21 0542 01/30/21 0547  BP: 140/89 (!) 149/81  (!) 155/86  Pulse: 89 95  89  Resp: 18 20  18   Temp: 98 F (36.7 C) 98.2 F (36.8 C)  98.5 F (36.9 C)  TempSrc: Oral Axillary  Oral  SpO2: 97% 100%  96%  Weight:   100.2 kg   Height:        Intake/Output Summary (Last 24 hours) at 01/30/2021 1352 Last data filed at 01/30/2021 0200 Gross per 24 hour  Intake 489.67 ml  Output 700 ml  Net -210.33 ml   Filed Weights   01/28/21 0556 01/29/21 0500 01/30/21 0542  Weight: 102.2 kg 103.2 kg 100.2 kg     Examination:        Physical Exam:   General:  Somnolent minimally interactive, eyes open does not follow commands  HEENT:  Right cranium absent otherwise within normal limits  Neuro:  Limited exam withdraws to pain stimuli nonverbal  Lungs:   Clear to auscultation BL, Respirations unlabored, no wheezes / crackles  Cardio:    S1/S2, RRR, No murmure, No Rubs or Gallops   Abdomen:   Soft, non-tender, bowel sounds active all four quadrants,  no guarding or peritoneal signs.  Positive PEG tube, functional, partial cranium underskin at the left side of abdomen  Muscular  skeletal:  Limited exam -comfortable in bed, not moving extremities, +2 edema upper and lower extremities  Skin:  Dry, warm to touch, negative for any Rashes,  Wounds: Please see nursing documentation              ------------------------------------------------------------------------------------------------------------------------------------------    LABs:  CBC Latest Ref Rng & Units 01/26/2021 01/24/2021 01/23/2021  WBC 4.0 - 10.5 K/uL 9.5 20.8(H) 24.7(H)  Hemoglobin 12.0 - 15.0 g/dL 11.3(L) 12.5 15.0  Hematocrit 36.0 - 46.0 % 35.5(L) 40.2 49.3(H)  Platelets 150 - 400 K/uL 163 151 135(L)   CMP Latest Ref Rng & Units 01/30/2021 01/29/2021 01/27/2021  Glucose 70 - 99 mg/dL 244(W) 102(V) 253(G)  BUN 6 - 20 mg/dL 64(Q) 03(K) 10  Creatinine 0.44 - 1.00 mg/dL 7.42 5.95 6.38(V)  Sodium 135 - 145 mmol/L 140 138 138  Potassium 3.5 - 5.1 mmol/L 2.7(LL) 2.8(L) 3.4(L)  Chloride 98 - 111 mmol/L 99 98 104  CO2 22 - 32 mmol/L 35(H) 33(H) 30  Calcium 8.9 - 10.3 mg/dL 9.5 9.8 9.5  Total Protein 6.5 - 8.1 g/dL 6.2(L) 6.3(L) 5.3(L)  Total Bilirubin 0.3 - 1.2 mg/dL 0.4 0.3 0.5  Alkaline Phos 38 - 126 U/L 56 56 47  AST 15 - 41 U/L 35 37 34  ALT 0 - 44 U/L 11 13 10      Micro Results Recent Results (from the past 240 hour(s))  Resp Panel by RT-PCR (Flu A&B, Covid) Nasopharyngeal Swab     Status: None   Collection Time: 01/21/21  6:34 PM   Specimen: Nasopharyngeal Swab; Nasopharyngeal(NP) swabs in vial transport medium  Result Value Ref Range Status   SARS Coronavirus 2 by RT PCR NEGATIVE NEGATIVE Final    Comment: (NOTE) SARS-CoV-2 target nucleic acids are NOT DETECTED.  The SARS-CoV-2 RNA is generally detectable in upper respiratory specimens during the acute phase of infection. The lowest concentration of SARS-CoV-2 viral copies this assay can detect is 138 copies/mL. A negative result does not preclude SARS-Cov-2 infection and should not be used as the sole basis for treatment or other  patient management decisions. A negative result may occur with  improper specimen collection/handling, submission of specimen other than nasopharyngeal swab, presence of viral mutation(s) within the areas targeted by this assay, and inadequate number of viral copies(<138 copies/mL). A negative result must be combined with clinical observations, patient history, and epidemiological information. The expected result is Negative.  Fact Sheet for  Patients:  BloggerCourse.com  Fact Sheet for Healthcare Providers:  SeriousBroker.it  This test is no t yet approved or cleared by the [place] FDA and  has been authorized for detection and/or diagnosis of SARS-CoV-2 by FDA under an Emergency Use Authorization (EUA). This EUA will remain  in effect (meaning this test can be used) for the duration of the COVID-19 declaration under Section 564(b)(1) of the Act, 21 U.S.C.section 360bbb-3(b)(1), unless the authorization is terminated  or revoked sooner.       Influenza A by PCR NEGATIVE NEGATIVE Final   Influenza B by PCR NEGATIVE NEGATIVE Final    Comment: (NOTE) The Xpert Xpress SARS-CoV-2/FLU/RSV plus assay is intended as an aid in the diagnosis of influenza from Nasopharyngeal swab specimens and should not be used as a sole basis for treatment. Nasal washings and aspirates are unacceptable for Xpert Xpress SARS-CoV-2/FLU/RSV testing.  Fact Sheet for Patients: BloggerCourse.com  Fact Sheet for Healthcare Providers: SeriousBroker.it  This test is not yet approved or cleared by the [place] FDA and has been authorized for detection and/or diagnosis of SARS-CoV-2 by FDA under an Emergency Use Authorization (EUA). This EUA will remain in effect (meaning this test can be used) for the duration of the COVID-19 declaration under Section 564(b)(1) of the Act, 21 U.S.C. section  360bbb-3(b)(1), unless the authorization is terminated or revoked.  Performed at [facility], [address], [place], [place] [address]     Radiology Reports CT Head Wo Contrast  Result Date: 01/21/2021 CLINICAL DATA:  Mental status change, unknown cause EXAM: CT HEAD WITHOUT CONTRAST TECHNIQUE: Contiguous axial images were obtained from the base of the skull through the vertex without intravenous contrast. 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 Ex vacuo dilatation of the left lateral ventricle. No acute infarct or hemorrhage. Vascular: Embolization coils seen in the left middle cerebral artery. No hyperdense vessel. Skull: Prior large left craniectomy. Sinuses/Orbits: No acute findings Other: None IMPRESSION: Prior left craniectomy with large area of encephalomalacia throughout much of the left cerebral hemisphere. Ex vacuo dilatation of the left lateral ventricle. No acute intracranial abnormality. Electronically Signed   By: Charlett Nose M.D.   On: 01/21/2021 19:38   CT Abdomen Pelvis W Contrast  Result Date: 01/21/2021 CLINICAL DATA:  Vomiting peg tube EXAM: CT ABDOMEN AND PELVIS WITH CONTRAST TECHNIQUE: Multidetector CT imaging of the abdomen and pelvis was performed using the standard protocol following bolus administration of intravenous contrast. CONTRAST:  55mL OMNIPAQUE IOHEXOL 350 MG/ML SOLN COMPARISON:  None. FINDINGS: Lower chest: Lung bases demonstrate partial lower lobe consolidation which may reflect atelectasis or aspiration. No pleural effusion. Cardiomegaly. Hepatobiliary: Subcentimeter hypodense lesion posterior right hepatic lobe too small to further characterize. No calcified gallstone or biliary dilatation Pancreas: Pancreatic enlargement with marked peripancreatic inflammatory change and fluid consistent with acute pancreatitis. No organized fluid collections. Homogeneous enhancement.  Spleen: Normal in size without focal abnormality. Adrenals/Urinary Tract: Adrenal glands are normal. Cyst lower pole left kidney. Small bilateral kidney stones. The bladder is thick walled and indistinct. Stomach/Bowel: Stomach nonenlarged. Mild wall thickening and mucosal enhancement of distal stomach and first portion of duodenum. No dilated small bowel. Hyperdense material in the colon. No acute bowel wall thickening. Gastrostomy tube in the distal body of the stomach. Vascular/Lymphatic: Nonaneurysmal aorta with mild atherosclerosis. No suspicious lymph nodes. Portal and splenic veins normally enhance. Reproductive: Prominent size uterus. Endometrial stripe slightly thickened up to 11 mm  with focal hypodense area in the posterior lower uterine segment questionable for fibroid. No adnexal mass Other: No free air. Small free fluid in the abdomen and pelvis. 11 by 12 by 9.4 cm suspected foreign body within the subcutaneous fat of the left anterior abdominal wall with small adjacent fluid. This is seen at the level of the umbilicus. Evidence of prior ventral hernia repair with small fat containing recurrent ventral hernia. Musculoskeletal: No acute osseous abnormality IMPRESSION: 1. Findings consistent with acute pancreatitis. No organized fluid collection or evidence for necrosis at this time. Small amount of abdominopelvic ascites 2. Gastrostomy tube present in the distal body of stomach. Mild wall thickening and mucosal enhancement of distal stomach and first portion of duodenum likely reflecting gastritis/duodenitis versus reactive inflammation from adjacent inflamed pancreas 3. 11 x 12 x 9.4 cm suspected foreign body within the subcutaneous fat of the left anterior body wall at the level of the umbilicus. Small amount of fluid surrounding the foreign body. 4. Cardiomegaly. Patchy atelectasis or aspiration changes at the bases. 5. Thickened endometrial stripe with slightly enlarged appearing uterus for age.  Possible hypoechoic mass within the posterior lower uterine segment. Recommend correlation with nonemergent ultrasound. Electronically Signed   By: Jasmine Pang M.D.   On: 01/21/2021 19:47   DG Chest Port 1 View  Result Date: 01/21/2021 CLINICAL DATA:  Altered mental status. EXAM: PORTABLE CHEST 1 VIEW COMPARISON:  July 15, 2020. FINDINGS: Stable cardiomediastinal silhouette. Tracheostomy tube has been removed. Both lungs are clear. The visualized skeletal structures are unremarkable. IMPRESSION: No active disease. Electronically Signed   By: Lupita Raider M.D.   On: 01/21/2021 16:27   US Abdomen Limited RUQ (LIVER/GB)  Result Date: 01/22/2021 CLINICAL DATA:  Acute pancreatitis EXAM: ULTRASOUND ABDOMEN LIMITED RIGHT UPPER QUADRANT COMPARISON:  CT abdomen 01/21/2021 FINDINGS: Gallbladder: Gallbladder wall is within normal limits at 0.2 cm. No gallstones or sonographic Murphy sign identified. Common bile duct: Diameter: 0.3 cm Liver: Mildly coarsened echotexture, without substantial focal lesion identified. Portal vein is patent on color Doppler imaging with normal direction of blood flow towards the liver. Other: Trace perihepatic ascites. IMPRESSION: 1. No findings of gallstones or biliary dilatation. 2. Trace perihepatic ascites. 3. Subtle coarsening of the echotexture of the liver is nonspecific but can be seen in a variety of conditions including hepatitis, metabolic abnormalities, cirrhosis, and hemochromatosis. Electronically Signed   By: Gaylyn Rong M.D.   On: 01/22/2021 10:46    SIGNED: Kendell Bane, MD,  [facility],  Pager (please use [email or web address] to page/text) Please use Epic Secure Chat for non-urgent communication (7AM-7PM)  If 7PM-7AM, please contact night-coverage [email or web address], 01/30/2021, 1:52 PM

## 2021-01-30 NOTE — Plan of Care (Signed)
  Problem: Education: Goal: Knowledge of General Education information will improve Description Including pain rating scale, medication(s)/side effects and non-pharmacologic comfort measures Outcome: Progressing   Problem: Health Behavior/Discharge Planning: Goal: Ability to manage health-related needs will improve Outcome: Progressing   

## 2021-01-31 DIAGNOSIS — K853 Drug induced acute pancreatitis without necrosis or infection: Secondary | ICD-10-CM | POA: Diagnosis not present

## 2021-01-31 DIAGNOSIS — R569 Unspecified convulsions: Secondary | ICD-10-CM | POA: Diagnosis not present

## 2021-01-31 DIAGNOSIS — K859 Acute pancreatitis without necrosis or infection, unspecified: Secondary | ICD-10-CM | POA: Diagnosis not present

## 2021-01-31 DIAGNOSIS — R9389 Abnormal findings on diagnostic imaging of other specified body structures: Secondary | ICD-10-CM | POA: Diagnosis not present

## 2021-01-31 DIAGNOSIS — R9431 Abnormal electrocardiogram [ECG] [EKG]: Secondary | ICD-10-CM | POA: Diagnosis not present

## 2021-01-31 DIAGNOSIS — R1310 Dysphagia, unspecified: Secondary | ICD-10-CM | POA: Diagnosis not present

## 2021-01-31 DIAGNOSIS — I608 Other nontraumatic subarachnoid hemorrhage: Secondary | ICD-10-CM | POA: Diagnosis not present

## 2021-01-31 DIAGNOSIS — I2699 Other pulmonary embolism without acute cor pulmonale: Secondary | ICD-10-CM | POA: Diagnosis not present

## 2021-01-31 LAB — RESP PANEL BY RT-PCR (FLU A&B, COVID) ARPGX2
Influenza A by PCR: NEGATIVE
Influenza B by PCR: NEGATIVE
SARS Coronavirus 2 by RT PCR: NEGATIVE

## 2021-01-31 LAB — GLUCOSE, CAPILLARY
Glucose-Capillary: 83 mg/dL (ref 70–99)
Glucose-Capillary: 124 mg/dL — ABNORMAL HIGH (ref 70–99)
Glucose-Capillary: 107 mg/dL — ABNORMAL HIGH (ref 70–99)
Glucose-Capillary: 101 mg/dL — ABNORMAL HIGH (ref 70–99)
Glucose-Capillary: 109 mg/dL — ABNORMAL HIGH (ref 70–99)

## 2021-01-31 LAB — BASIC METABOLIC PANEL
Anion gap: 9 (ref 5–15)
BUN: 23 mg/dL — ABNORMAL HIGH (ref 6–20)
CO2: 34 mmol/L — ABNORMAL HIGH (ref 22–32)
Calcium: 9.6 mg/dL (ref 8.9–10.3)
Chloride: 97 mmol/L — ABNORMAL LOW (ref 98–111)
Creatinine, Ser: 0.43 mg/dL — ABNORMAL LOW (ref 0.44–1.00)
GFR, Estimated: >60 mL/min (ref >60)
Glucose, Bld: 114 mg/dL — ABNORMAL HIGH (ref 70–99)
Potassium: 2.7 mmol/L — CL (ref 3.5–5.1)
Sodium: 140 mmol/L (ref 135–145)

## 2021-01-31 LAB — MAGNESIUM: Magnesium: 1.6 mg/dL — ABNORMAL LOW (ref 1.7–2.4)

## 2021-01-31 MED ORDER — PROSOURCE TF PO LIQD: 90.0000 mL | Freq: Four times a day (QID) | ORAL | 0 refills | Status: AC

## 2021-01-31 MED ORDER — ACETAMINOPHEN 325 MG PO TABS: 650.0000 mg | ORAL_TABLET | Freq: Four times a day (QID) | ORAL | 0 refills | Status: AC | PRN

## 2021-01-31 MED ORDER — PANTOPRAZOLE SODIUM 40 MG PO PACK: 40.0000 mg | PACK | Freq: Every day | ORAL | 1 refills | Status: DC

## 2021-01-31 MED ORDER — FREE WATER: 30.0000 mL | 0 refills | Status: AC

## 2021-01-31 MED ORDER — LEVETIRACETAM 100 MG/ML PO SOLN: 1000.0000 mg | Freq: Two times a day (BID) | ORAL | 12 refills | Status: DC

## 2021-01-31 MED ORDER — POTASSIUM CHLORIDE 20 MEQ PO PACK
60.0000 meq | PACK | Freq: Two times a day (BID) | ORAL | Status: AC
  Administered 2021-01-31 (×2): 60 meq via ORAL
  Filled 2021-01-31 (×2): qty 3

## 2021-01-31 MED ORDER — LACOSAMIDE 100 MG PO TABS: 100.0000 mg | ORAL_TABLET | Freq: Two times a day (BID) | ORAL | 2 refills | Status: DC

## 2021-01-31 MED ORDER — OSMOLITE 1.5 CAL PO LIQD: 1000.0000 mL | ORAL | 0 refills | Status: AC

## 2021-01-31 NOTE — Plan of Care (Signed)
  Problem: Education: Goal: Knowledge of General Education information will improve Description Including pain rating scale, medication(s)/side effects and non-pharmacologic comfort measures Outcome: Progressing   

## 2021-01-31 NOTE — Discharge Summary (Signed)
Physician Discharge Summary [facility]    Patient: Denise Savage                   Admit date: 01/21/2021   DOB: 08-11-1960             Discharge date:01/31/2021/11:05 AM [id number]                          PCP: Denise PanderBlass, Joel, MD  Disposition:  SNF Recommendations for Outpatient Follow-up:   Follow up: With PCP, neurosurgery within the next 3-4 weeks Check BMP in 1 to 2 days, monitor potassium electro levels closely and weekly with Continue tube feeds Aspiration precaution  Discharge Condition: Stable   Code Status:   Code Status: Full Code  Diet recommendation: Continue current tube feeds Aspiration precaution   Discharge Diagnoses:    Principal Problem:   Acute pancreatitis Active Problems:   Aneurysmal subarachnoid hemorrhage (HCC)   Seizures (HCC)   Pulmonary embolus (HCC)   Dysphagia   Prolonged QT interval   Thickened endometrium   History of Present Illness/ Hospital Course Charline Bills/Brief Summary:   Denise DoffingCarol J Savage is a 60 y.o. 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  Patient was noted to develop nausea and vomiting this morning at her SNF, tube feeds were stopped, but she continued to have nausea and vomiting was sent to the ED.  She has never had pancreatitis before per report of her family.  Neurologically, her family reports that she has been making improvements since her ruptured aneurysm in January but is often nonverbal and when she does try to speak, cannot be understood.  Family suspects that she was in some pain today as they noted her blood pressure to be higher than normal.  Patient seemed more somnolent than usual today.    ED Course: Upon arrival to the ED, patient is found to be afebrile, saturating  well on room air, and with stable blood pressure.  EKG features sinus rhythm with QTc interval of 635 ms.  Chemistry panel notable for elevated BUN to creatinine ratio.  CBC with mild polycythemia and slight thrombocytopenia.  Lactic acid normal.  Lipase 643.  Head CT negative for acute findings.  CT of the abdomen and pelvis concerning for acute pancreatitis without organized fluid collection or necrosis.  Patient was given IV fluids and Zofran in the ED.     1. Acute pancreatitis /gastro duodenitis -Resolved   - Lipase of 643 >>> 326 - CT findings consistent with acute pancreatitis as well as gastritis and duodenitis - LFTs normal, no biliary dilation on CT  -Right upper quadrant ultrasound without cholelithiasis or cholecystitis findings - Triglycerides 91, lactic acid 1.9 -   -Patient was restarted on tube feed on 01/24/2021, (improved lipase level) -Discussed with GI service--optimizing tube feeds     -cont. Tube Feeding with Osmolite - ProSource TF 45 ml BID per tube -Water flushes 150 mL every 4 hours -Continue PPI -Remained stable   2. Prolonged QT interval  -Much improved - QTc is 635 ms in ED  -Repeat EKG on 01/23/2021 with QTC less than 350 -Keep magnesium close to 2 and keep potassium close to 4   3. PE   - Admitted 11/18/20 with small  RLL PE without heart strain and was discharged on Eliquis  - Continue Eliquis       4. Seizures--status post prior craniectomy -very high risk for seizures, status post craniectomy, patient's Skull is currently in her left abdominal area, plan is to put her skull  back at a later date -PTA patient is on Vimpat, Keppra and Depakote (during hospitalization medications were given IV_ Switching back to PEG tube -Neurologist consulted: It was tapered off Depakote  - increase Vimpat, and continue Keppra via PEG tube -Stable     5. 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 Endometrial thickening and possible fibroid - Noted incidentally on CT, non-emergent [place] recommended  -Discussed with patient's son Denise Savage,  -Treatment as above   7) hypoglycemia due to n.p.o. status--- resolved as tube feeds resumed   8) hypercalcemia--- episodes of hypercalcemia from time to time usually improves with hydration   9) Hypokalemia -monitoring and replete accordingly     disposition--- discharge back to SNF when tolerating tube feed well   ----------------------------------------------------------------------------------------------------------------------------------------------- Nutritional status:  The patient's BMI is: Body mass index is 37.92 kg/m. I agree with the assessment and plan as outlined below: Nutrition Status: Nutrition Problem: Inadequate oral intake Etiology: inability to eat Signs/Symptoms: NPO status Interventions: Tube feeding, Prostat ------------------------------------------------------------------------------------------------------------------------------------------------ Cultures / Antimicrobials: none    Consultants: Allergy/gastroenterology   ------------------------------------------------------------------------------------------------------------------------------------------------   DVT prophylaxis: continue SCD/Compression stockings and Eliquis Code Status:   Code Status: Full Code   Family Communication: Discussed with her son Denise Savage at bedside  Called her daughter left a message   Admission status:      Dispo: The patient is from: SNF              Anticipated d/c is to: SNF      Discharge Instructions:   Discharge Instructions     Activity as tolerated - No restrictions   Complete by: As directed    Discharge  instructions   Complete by: As directed    Continue current medication as prescribed.Marland Kitchen  BMP in 2 days, monitor potassium level closely -Need to be repositioned every 2 hours -Continue tube feeds as instructed -Follow-up with neurosurgery as scheduled -Recommending palliative consultation and follow-up   Increase activity slowly   Complete by: As directed         Medication List     STOP taking these medications    HYDROcodone-acetaminophen 7.5-325 MG tablet Commonly known as: NORCO   lacosamide 10 MG/ML oral solution Commonly known as: VIMPAT Replaced by: Lacosamide 100 MG Tabs   losartan 25 MG tablet Commonly known as: COZAAR   tamsulosin 0.4 MG Caps capsule Commonly known as: FLOMAX   valproic acid 250 MG/5ML solution Commonly known as: DEPAKENE       TAKE these medications    acetaminophen 325 MG tablet Commonly known as: TYLENOL Place 2 tablets (650 mg total) into feeding tube every 6 (six) hours as needed for mild pain (or Fever >/= 101).   Eliquis 5 MG Tabs tablet Generic drug: apixaban Take 5 mg by mouth 2 (two) times daily.   feeding supplement (PROSource TF) liquid Place 90 mLs into feeding tube 4 (four) times daily.   feeding supplement (OSMOLITE 1.5 CAL) Liqd Place 1,000 mLs into feeding  tube daily.   free water Soln Place 30 mLs into feeding tube every 4 (four) hours.   Lacosamide 100 MG Tabs Place 1 tablet (100 mg total) into feeding tube 2 (two) times daily. Replaces: lacosamide 10 MG/ML oral solution   levETIRAcetam 100 MG/ML solution Commonly known as: KEPPRA Place 10 mLs (1,000 mg total) into feeding tube 2 (two) times daily. What changed:  how much to take Another medication with the same name was removed. Continue taking this medication, and follow the directions you see here.   pantoprazole sodium 40 mg Pack Commonly known as: PROTONIX Place 20 mLs (40 mg total) into feeding tube daily. Start taking on: February 01, 2021         Allergies  Allergen Reactions   Hydrocodone Nausea Only     Procedures /Studies:   CT Head Wo Contrast  Result Date: 01/21/2021 CLINICAL DATA:  Mental status change, unknown cause EXAM: CT HEAD WITHOUT CONTRAST TECHNIQUE: Contiguous axial images were obtained from the base of the skull through the vertex without intravenous contrast. 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 Ex vacuo dilatation of the left lateral ventricle. No acute infarct or hemorrhage. Vascular: Embolization coils seen in the left middle cerebral artery. No hyperdense vessel. Skull: Prior large left craniectomy. Sinuses/Orbits: No acute findings Other: None IMPRESSION: Prior left craniectomy with large area of encephalomalacia throughout much of the left cerebral hemisphere. Ex vacuo dilatation of the left lateral ventricle. No acute intracranial abnormality. Electronically Signed   By: Charlett Nose M.D.   On: 01/21/2021 19:38   CT Abdomen Pelvis W Contrast  Result Date: 01/21/2021 CLINICAL DATA:  Vomiting peg tube EXAM: CT ABDOMEN AND PELVIS WITH CONTRAST TECHNIQUE: Multidetector CT imaging of the abdomen and pelvis was performed using the standard protocol following bolus administration of intravenous contrast. CONTRAST:  75mL OMNIPAQUE IOHEXOL 350 MG/ML SOLN COMPARISON:  None. FINDINGS: Lower chest: Lung bases demonstrate partial lower lobe consolidation which may reflect atelectasis or aspiration. No pleural effusion. Cardiomegaly. Hepatobiliary: Subcentimeter hypodense lesion posterior right hepatic lobe too small to further characterize. No calcified gallstone or biliary dilatation Pancreas: Pancreatic enlargement with marked peripancreatic inflammatory change and fluid consistent with acute pancreatitis. No organized fluid collections. Homogeneous enhancement. Spleen: Normal in size without focal abnormality. Adrenals/Urinary Tract:  Adrenal glands are normal. Cyst lower pole left kidney. Small bilateral kidney stones. The bladder is thick walled and indistinct. Stomach/Bowel: Stomach nonenlarged. Mild wall thickening and mucosal enhancement of distal stomach and first portion of duodenum. No dilated small bowel. Hyperdense material in the colon. No acute bowel wall thickening. Gastrostomy tube in the distal body of the stomach. Vascular/Lymphatic: Nonaneurysmal aorta with mild atherosclerosis. No suspicious lymph nodes. Portal and splenic veins normally enhance. Reproductive: Prominent size uterus. Endometrial stripe slightly thickened up to 11 mm with focal hypodense area in the posterior lower uterine segment questionable for fibroid. No adnexal mass Other: No free air. Small free fluid in the abdomen and pelvis. 11 by 12 by 9.4 cm suspected foreign body within the subcutaneous fat of the left anterior abdominal wall with small adjacent fluid. This is seen at the level of the umbilicus. Evidence of prior ventral hernia repair with small fat containing recurrent ventral hernia. Musculoskeletal: No acute osseous abnormality IMPRESSION: 1. Findings consistent with acute pancreatitis. No organized fluid collection or evidence for necrosis at this time. Small amount of abdominopelvic ascites 2. Gastrostomy tube present in the distal body  of stomach. Mild wall thickening and mucosal enhancement of distal stomach and first portion of duodenum likely reflecting gastritis/duodenitis versus reactive inflammation from adjacent inflamed pancreas 3. 11 x 12 x 9.4 cm suspected foreign body within the subcutaneous fat of the left anterior body wall at the level of the umbilicus. Small amount of fluid surrounding the foreign body. 4. Cardiomegaly. Patchy atelectasis or aspiration changes at the bases. 5. Thickened endometrial stripe with slightly enlarged appearing uterus for age. Possible hypoechoic mass within the posterior lower uterine segment. Recommend  correlation with nonemergent ultrasound. Electronically Signed   By: Jasmine Pang M.D.   On: 01/21/2021 19:47   DG Chest Port 1 View  Result Date: 01/21/2021 CLINICAL DATA:  Altered mental status. EXAM: PORTABLE CHEST 1 VIEW COMPARISON:  July 15, 2020. FINDINGS: Stable cardiomediastinal silhouette. Tracheostomy tube has been removed. Both lungs are clear. The visualized skeletal structures are unremarkable. IMPRESSION: No active disease. Electronically Signed   By: Lupita Raider M.D.   On: 01/21/2021 16:27   US Abdomen Limited RUQ (LIVER/GB)  Result Date: 01/22/2021 CLINICAL DATA:  Acute pancreatitis EXAM: ULTRASOUND ABDOMEN LIMITED RIGHT UPPER QUADRANT COMPARISON:  CT abdomen 01/21/2021 FINDINGS: Gallbladder: Gallbladder wall is within normal limits at 0.2 cm. No gallstones or sonographic Murphy sign identified. Common bile duct: Diameter: 0.3 cm Liver: Mildly coarsened echotexture, without substantial focal lesion identified. Portal vein is patent on color Doppler imaging with normal direction of blood flow towards the liver. Other: Trace perihepatic ascites. IMPRESSION: 1. No findings of gallstones or biliary dilatation. 2. Trace perihepatic ascites. 3. Subtle coarsening of the echotexture of the liver is nonspecific but can be seen in a variety of conditions including hepatitis, metabolic abnormalities, cirrhosis, and hemochromatosis. Electronically Signed   By: Gaylyn Rong M.D.   On: 01/22/2021 10:46    Subjective:   Patient was seen and examined 01/31/2021, 11:05 AM Patient stable today. No acute distress.  No issues overnight Stable for discharge.  Discharge Exam:    Vitals:   01/30/21 1524 01/30/21 2138 01/31/21 0427 01/31/21 0500  BP: (!) 142/80 (!) 146/91 (!) 146/81   Pulse: 78 82 91   Resp:  19 18   Temp: 98 F (36.7 C) 98.7 F (37.1 C) 98.6 F (37 C)   TempSrc: Oral Oral Oral   SpO2: 96% 94% 94%   Weight:    100.2 kg  Height:        General: Patient is at  baseline nonverbal not communicating not ambulating needs assist with all ADLs Pt lying comfortably in bed & appears in no obvious distress. Cardiovascular: S1 & S2 heard, RRR, S1/S2 +. No murmurs, rubs, gallops or clicks. No JVD or pedal edema. Respiratory: Clear to auscultation without wheezing, rhonchi or crackles. No increased work of breathing. Abdominal:  Non-distended, non-tender & soft. No organomegaly or masses appreciated. Normal bowel sounds heard. CNS: Alert and oriented. No focal deficits. Extremities: no edema, no cyanosis      The results of significant diagnostics from this hospitalization (including imaging, microbiology, ancillary and laboratory) are listed below for reference.      Microbiology:   Recent Results (from the past 240 hour(s))  Resp Panel by RT-PCR (Flu A&B, Covid) Nasopharyngeal Swab     Status: None   Collection Time: 01/21/21  6:34 PM   Specimen: Nasopharyngeal Swab; Nasopharyngeal(NP) swabs in vial transport medium  Result Value Ref Range Status   SARS Coronavirus 2 by RT PCR NEGATIVE NEGATIVE Final  Comment: (NOTE) SARS-CoV-2 target nucleic acids are NOT DETECTED.  The SARS-CoV-2 RNA is generally detectable in upper respiratory specimens during the acute phase of infection. The lowest concentration of SARS-CoV-2 viral copies this assay can detect is 138 copies/mL. A negative result does not preclude SARS-Cov-2 infection and should not be used as the sole basis for treatment or other patient management decisions. A negative result may occur with  improper specimen collection/handling, submission of specimen other than nasopharyngeal swab, presence of viral mutation(s) within the areas targeted by this assay, and inadequate number of viral copies(<138 copies/mL). A negative result must be combined with clinical observations, patient history, and epidemiological information. The expected result is Negative.  Fact Sheet for Patients:   BloggerCourse.com  Fact Sheet for Healthcare Providers:  SeriousBroker.it  This test is no t yet approved or cleared by the [place] FDA and  has been authorized for detection and/or diagnosis of SARS-CoV-2 by FDA under an Emergency Use Authorization (EUA). This EUA will remain  in effect (meaning this test can be used) for the duration of the COVID-19 declaration under Section 564(b)(1) of the Act, 21 U.S.C.section 360bbb-3(b)(1), unless the authorization is terminated  or revoked sooner.       Influenza A by PCR NEGATIVE NEGATIVE Final   Influenza B by PCR NEGATIVE NEGATIVE Final    Comment: (NOTE) The Xpert Xpress SARS-CoV-2/FLU/RSV plus assay is intended as an aid in the diagnosis of influenza from Nasopharyngeal swab specimens and should not be used as a sole basis for treatment. Nasal washings and aspirates are unacceptable for Xpert Xpress SARS-CoV-2/FLU/RSV testing.  Fact Sheet for Patients: BloggerCourse.com  Fact Sheet for Healthcare Providers: SeriousBroker.it  This test is not yet approved or cleared by the [place] FDA and has been authorized for detection and/or diagnosis of SARS-CoV-2 by FDA under an Emergency Use Authorization (EUA). This EUA will remain in effect (meaning this test can be used) for the duration of the COVID-19 declaration under Section 564(b)(1) of the Act, 21 U.S.C. section 360bbb-3(b)(1), unless the authorization is terminated or revoked.  Performed at [facility], [address] [place], [place] [address]      Labs:   CBC: Recent Labs  Lab 01/26/21 0336  WBC 9.5  HGB 11.3*  HCT 35.5*  MCV 94.2  PLT 163   Basic Metabolic Panel: Recent Labs  Lab 01/26/21 0336 01/27/21 0806 01/29/21 1237 01/30/21 1008 01/31/21 0547  NA 136 138 138 140 140  K 3.1* 3.4* 2.8* 2.7* 2.7*  CL 103 104 98 99 97*  CO2 27 30 33* 35* 34*   GLUCOSE 151* 108* 132* 121* 114*  BUN 11 10 22* 24* 23*  CREATININE 0.41* 0.37* 0.53 0.48 0.43*  CALCIUM 9.4 9.5 9.8 9.5 9.6   Liver Function Tests: Recent Labs  Lab 01/26/21 0336 01/27/21 0806 01/29/21 1237 01/30/21 1008  AST 25 34 37 35  ALT 9 10 13 11   ALKPHOS 48 47 56 56  BILITOT 0.4 0.5 0.3 0.4  PROT 5.3* 5.3* 6.3* 6.2*  ALBUMIN 1.7* 1.7* 2.0* 2.0*   BNP (last 3 results) No results for input(s): BNP in the last 8760 hours. Cardiac Enzymes: No results for input(s): CKTOTAL, CKMB, CKMBINDEX, TROPONINI in the last 168 hours. CBG: Recent Labs  Lab 01/30/21 1120 01/30/21 1627 01/30/21 2147 01/31/21 0239 01/31/21 0822  GLUCAP 123* 102* 94 83 124*   Hgb A1c No results for input(s): HGBA1C in the last 72 hours. Lipid Profile No results for input(s):  CHOL, HDL, LDLCALC, TRIG, CHOLHDL, LDLDIRECT in the last 72 hours. Thyroid function studies No results for input(s): TSH, T4TOTAL, T3FREE, THYROIDAB in the last 72 hours.  Invalid input(s): FREET3 Anemia work up No results for input(s): VITAMINB12, FOLATE, FERRITIN, TIBC, IRON, RETICCTPCT in the last 72 hours. Urinalysis    Component Value Date/Time   COLORURINE YELLOW 07/16/2020 0418   APPEARANCEUR CLEAR 07/16/2020 0418   LABSPEC 1.026 07/16/2020 0418   PHURINE 5.0 07/16/2020 0418   GLUCOSEU NEGATIVE 07/16/2020 0418   HGBUR NEGATIVE 07/16/2020 0418   BILIRUBINUR NEGATIVE 07/16/2020 0418   KETONESUR NEGATIVE 07/16/2020 0418   PROTEINUR NEGATIVE 07/16/2020 0418   NITRITE NEGATIVE 07/16/2020 0418   LEUKOCYTESUR TRACE (A) 07/16/2020 0418         Time coordinating discharge: Over 55 minutes  SIGNED: Kendell Bane, MD, FACP, FHM. [facility],  Please use [email or web address] to Page If 7PM-7AM, please contact night-coverage [email or web address], Password [facility] 01/31/2021, 11:05 AM

## 2021-01-31 NOTE — TOC Progression Note (Signed)
Transition of Care [facility]) - Progression Note    Patient Details  Name: Denise Savage MRN: [id number] Date of Birth: [birth year]  Transition of Care [facility]) CM/SW Contact  Barry Brunner, LCSW Phone Number: 01/31/2021, 12:58 PM  Clinical Narrative:    CSW contacted Debbie with [facility]. Debbie reported no auth received. TOC to follow.      Barriers to Discharge: Continued Medical Work up  Expected Discharge Plan and Services           Expected Discharge Date: 01/31/21                                     Social Determinants of Health (SDOH) Interventions    Readmission Risk Interventions Readmission Risk Prevention Plan 09/08/2020  Transportation Screening Complete  PCP or Specialist Appt within 5-7 Days Complete  Home Care Screening Complete  Medication Review (RN CM) Complete  Some recent data might be hidden

## 2021-02-01 DIAGNOSIS — R569 Unspecified convulsions: Secondary | ICD-10-CM | POA: Diagnosis not present

## 2021-02-01 DIAGNOSIS — K859 Acute pancreatitis without necrosis or infection, unspecified: Secondary | ICD-10-CM | POA: Diagnosis not present

## 2021-02-01 DIAGNOSIS — R9389 Abnormal findings on diagnostic imaging of other specified body structures: Secondary | ICD-10-CM | POA: Diagnosis not present

## 2021-02-01 DIAGNOSIS — K853 Drug induced acute pancreatitis without necrosis or infection: Secondary | ICD-10-CM | POA: Diagnosis not present

## 2021-02-01 DIAGNOSIS — I2699 Other pulmonary embolism without acute cor pulmonale: Secondary | ICD-10-CM | POA: Diagnosis not present

## 2021-02-01 DIAGNOSIS — R9431 Abnormal electrocardiogram [ECG] [EKG]: Secondary | ICD-10-CM | POA: Diagnosis not present

## 2021-02-01 DIAGNOSIS — R1310 Dysphagia, unspecified: Secondary | ICD-10-CM | POA: Diagnosis not present

## 2021-02-01 DIAGNOSIS — I608 Other nontraumatic subarachnoid hemorrhage: Secondary | ICD-10-CM | POA: Diagnosis not present

## 2021-02-01 LAB — BASIC METABOLIC PANEL
Anion gap: 8 (ref 5–15)
BUN: 23 mg/dL — ABNORMAL HIGH (ref 6–20)
CO2: 31 mmol/L (ref 22–32)
Calcium: 9.9 mg/dL (ref 8.9–10.3)
Chloride: 101 mmol/L (ref 98–111)
Creatinine, Ser: 0.44 mg/dL (ref 0.44–1.00)
GFR, Estimated: >60 mL/min (ref >60)
Glucose, Bld: 96 mg/dL (ref 70–99)
Potassium: 3.7 mmol/L (ref 3.5–5.1)
Sodium: 140 mmol/L (ref 135–145)

## 2021-02-01 LAB — GLUCOSE, CAPILLARY
Glucose-Capillary: 100 mg/dL — ABNORMAL HIGH (ref 70–99)
Glucose-Capillary: 100 mg/dL — ABNORMAL HIGH (ref 70–99)
Glucose-Capillary: 105 mg/dL — ABNORMAL HIGH (ref 70–99)
Glucose-Capillary: 112 mg/dL — ABNORMAL HIGH (ref 70–99)
Glucose-Capillary: 114 mg/dL — ABNORMAL HIGH (ref 70–99)
Glucose-Capillary: 118 mg/dL — ABNORMAL HIGH (ref 70–99)

## 2021-02-01 NOTE — Progress Notes (Signed)
Nutrition Follow-up  DOCUMENTATION CODES:   Obesity unspecified  INTERVENTION:  Osmolite 1.5@ 20 ml/hr via PEG tube currently. Patient to discharge back to SNF today. Strongly recommend continue advancing to 55 ml/hr.   Decrease ProSource TF from 90 ml QID per tube to 45 ml BID per tube IF tube feeding is advanced otherwise continue at current rate.   Free water 150 ml q 4 hrs -if no IV fluids.   NUTRITION DIAGNOSIS:  Inadequate oral intake related to inability to eat as evidenced by NPO status.  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  Patient was noted to develop nausea and vomiting this morning at her SNF, tube feeds were stopped, but she continued to have nausea and vomiting was sent to the ED.  She has never had pancreatitis before per report of her family.  Neurologically, her family reports that she has been making improvements since her ruptured aneurysm in January but is often nonverbal and when she does try to speak, cannot be understood.  Family suspects that she was in some pain today as they noted her blood pressure to be higher than normal.  Patient seemed more somnolent than usual today.  8/15 Patient discussed during rounds. Cleared per MD to continue advancing to goal rate. RD to adjust. Discussed with nursing- patient tolerating nutrition at current rate. Hypoglycemia this morning. D5% being resumed. Last BM unknown??  8/16 Talked with nursing bedside. Tube feeding titrated down around 1000 this morning to 20 ml/hr due to increased lipase. Providing 576  kcal, 27 gr protein and 394 ml water. Meeting 30% energy, 26% protein needs. Question if pt may be candidate for Creon via PEG?    8/17 Patient receiving tube feeding at 20 ml/hr and protein modular BID. D5% supporting fluid intake and glucose stability. Recommend advance feeding due to her hypocaloric, limited protein intake. Will increase protein modular to QID.   8/19 Pt tolerating tube feeding at 20 ml/hr. Son bedside and pt has her eyes open this morning. Continue advancing tube feeding to goal of 55 ml/hr as tolerated. Will adjust protein modular as tube feeds are advanced per MD. At current rate of 20 ml hr new formula provides 720 kcal, 26.6 gr protein and 394 ml water. Protein modular adds-320 kcal, 88 gr protein.   8/22 Pt continues on Osm 1.5 @ 20 ml/hr. Talked with nursing about concern regarding advancement of TF. She is to discharge back to SNF today. Recommendations for discharge regimen noted above.  Medications reviewed and include: Reviewed.   Labs: BMP Latest Ref Rng & Units 02/01/2021 01/31/2021 01/30/2021  Glucose 70 - 99 mg/dL 96 782(N) 562(Z)  BUN 6 - 20 mg/dL 30(Q) 65(H) 84(O)  Creatinine 0.44 - 1.00 mg/dL 9.62 9.52(W) 4.13  Sodium 135 - 145 mmol/L 140 140 140  Potassium 3.5 - 5.1 mmol/L 3.7 2.7(LL) 2.7(LL)  Chloride 98 - 111 mmol/L 101 97(L) 99  CO2 22 - 32 mmol/L 31 34(H) 35(H)  Calcium 8.9 - 10.3 mg/dL 9.9 9.6 9.5      Diet Order:  Diet Order             Diet NPO time specified  Diet effective now                   EDUCATION NEEDS:   Not appropriate for education at this time  Skin:  Skin Assessment: Reviewed RN Assessment (PEG tube in place)  Last BM:  8/21  Height:   Ht Readings from Last 1 Encounters:  01/21/21 5\' 4"  (1.626 m)    Weight:   Wt Readings from Last 1 Encounters:  02/01/21 97.7 kg    Ideal Body Weight:   55 kg  BMI:  Body mass index is 36.97 kg/m.  Estimated Nutritional Needs:   Kcal:  1900-2100  Protein:  105-120  grams  Fluid:  >1.9 L   02/03/21 MS,RD,CSG,LDN Contact: AMION

## 2021-02-01 NOTE — TOC Progression Note (Signed)
Transition of Care [facility]) - Progression Note    Patient Details  Name: Denise Savage MRN: [id number] Date of Birth: [birth year]  Transition of Care [facility]) CM/SW Contact  Annice Needy, LCSW Phone Number: 02/01/2021, 4:25 PM  Clinical Narrative:    Facility recently received insurance authorization, however her room will not be ready until 8/23.     Barriers to Discharge: Insurance Authorization  Expected Discharge Plan and Services           Expected Discharge Date: 01/31/21                                     Social Determinants of Health (SDOH) Interventions    Readmission Risk Interventions Readmission Risk Prevention Plan 09/08/2020  Transportation Screening Complete  PCP or Specialist Appt within 5-7 Days Complete  Home Care Screening Complete  Medication Review (RN CM) Complete  Some recent data might be hidden

## 2021-02-01 NOTE — Discharge Summary (Signed)
Physician Discharge Summary [facility]    Patient: Denise Savage                   Admit date: 01/21/2021   DOB: 04-25-1961             Discharge date:02/01/2021/11:35 AM [id number]                          PCP: Denise PanderBlass, Joel, MD  Disposition:  SNF Recommendations for Outpatient Follow-up:   Follow up: With PCP, neurosurgery within the next 3-4 weeks Check BMP in 1 to 2 days, monitor potassium electro levels closely and weekly with Continue tube feeds Aspiration precaution  Discharge Condition: Stable   Code Status:   Code Status: Full Code  Diet recommendation: Continue current tube feeds:  Osm 1.5 ml/hr at 55 ml/hr PTA.  protein 30 ml BID  Free water 150 ml q 6hr.   Aspiration precaution   Discharge Diagnoses:    Principal Problem:   Acute pancreatitis Active Problems:   Aneurysmal subarachnoid hemorrhage (HCC)   Seizures (HCC)   Pulmonary embolus (HCC)   Dysphagia   Prolonged QT interval   Thickened endometrium   History of Present Illness/ Hospital Course Denise Savage/Brief Summary:   Denise DoffingCarol J Savage is a 60 y.o. 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  Patient was noted to develop nausea and vomiting this morning at her SNF, tube feeds were stopped, but she continued to have nausea and vomiting was sent to the ED.  She has never had pancreatitis before per report of her family.  Neurologically, her family reports that she has been making improvements since her ruptured aneurysm in January but is often nonverbal and when she does try to speak, cannot be understood.  Family suspects that she was in some pain today as they noted her blood pressure to be higher than normal.  Patient seemed more somnolent than usual today.     ED Course: Upon arrival to the ED, patient is found to be afebrile, saturating well on room air, and with stable blood pressure.  EKG features sinus rhythm with QTc interval of 635 ms.  Chemistry panel notable for elevated BUN to creatinine ratio.  CBC with mild polycythemia and slight thrombocytopenia.  Lactic acid normal.  Lipase 643.  Head CT negative for acute findings.  CT of the abdomen and pelvis concerning for acute pancreatitis without organized fluid collection or necrosis.  Patient was given IV fluids and Zofran in the ED.     1. Acute pancreatitis /gastro duodenitis -Resolved   - Lipase of 643 >>> 326 - CT findings consistent with acute pancreatitis as well as gastritis and duodenitis - LFTs normal, no biliary dilation on CT  -Right upper quadrant ultrasound without cholelithiasis or cholecystitis findings - Triglycerides 91, lactic acid 1.9 -   -Patient was restarted on tube feed on 01/24/2021, (improved lipase level) -Discussed with GI service--optimizing tube feeds     Osm 1.5 ml/hr at 55 ml/hr PTA.  protein 30 ml BID  Free water 150 ml q 6hr.  -Remained stable   2. Prolonged QT interval  -Much improved - QTc is 635 ms in ED  -Repeat EKG on 01/23/2021 with QTC less than 350 -Keep magnesium  close to 2 and keep potassium close to 4   3. PE   - Admitted 11/18/20 with small RLL PE without heart strain and was discharged on Eliquis  - Continue Eliquis       4. Seizures--status post prior craniectomy -very high risk for seizures, status post craniectomy, patient's Skull is currently in her left abdominal area, plan is to put her skull  back at a later date -PTA patient is on Vimpat, Keppra and Depakote (during hospitalization medications were given IV) Switching back to PEG tube -Neurologist consulted: It was tapered off Depakote  - increase Vimpat, and continue Keppra via PEG tube -Stable     5. 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 Endometrial thickening and possible fibroid - Noted incidentally on CT, non-emergent [place] recommended  -Discussed with patient's son Denise Savage,  -Treatment as above   7) hypoglycemia due to n.p.o. status--- resolved as tube feeds resumed   8) hypercalcemia--- episodes of hypercalcemia from time to time usually improves with hydration   9) Hypokalemia -monitoring and replete accordingly     disposition--- discharge back to SNF when tolerating tube feed well   ----------------------------------------------------------------------------------------------------------------------------------------------- Nutritional status:  The patient's BMI is: Body mass index is 37.92 kg/m. I agree with the assessment and plan as outlined below: Nutrition Status: Nutrition Problem: Inadequate oral intake Etiology: inability to eat Signs/Symptoms: NPO status Interventions: Tube feeding, Prostat ------------------------------------------------------------------------------------------------------------------------------------------------ Cultures / Antimicrobials: none    Consultants: Allergy/gastroenterology   ------------------------------------------------------------------------------------------------------------------------------------------------   DVT prophylaxis: continue SCD/Compression stockings and Eliquis Code Status:   Code Status: Full Code   Family Communication: Discussed with her son Denise Savage at bedside  Called her daughter left a message   Admission status:      Dispo: The patient is from: SNF              Anticipated d/c is to: SNF      Discharge Instructions:   Discharge Instructions     Activity as tolerated - No restrictions    Complete by: As directed    Discharge instructions   Complete by: As directed    Continue current medication as prescribed.Marland Kitchen  BMP in 2 days, monitor potassium level closely -Need to be repositioned every 2 hours -Continue tube feeds as instructed -Follow-up with neurosurgery as scheduled -Recommending palliative consultation and follow-up   Increase activity slowly   Complete by: As directed         Medication List     STOP taking these medications    HYDROcodone-acetaminophen 7.5-325 MG tablet Commonly known as: NORCO   lacosamide 10 MG/ML oral solution Commonly known as: VIMPAT Replaced by: Lacosamide 100 MG Tabs   losartan 25 MG tablet Commonly known as: COZAAR   tamsulosin 0.4 MG Caps capsule Commonly known as: FLOMAX   valproic acid 250 MG/5ML solution Commonly known as: DEPAKENE       TAKE these medications    acetaminophen 325 MG tablet Commonly known as: TYLENOL Place 2 tablets (650 mg total) into feeding tube every 6 (six) hours as needed for mild pain (or Fever >/= 101).   Eliquis 5 MG Tabs tablet Generic drug: apixaban Take 5 mg by mouth 2 (two) times daily.   feeding supplement (PROSource TF) liquid Place 90 mLs  into feeding tube 4 (four) times daily.   feeding supplement (OSMOLITE 1.5 CAL) Liqd Place 1,000 mLs into feeding tube daily.   free water Soln Place 30 mLs into feeding tube every 4 (four) hours.   Lacosamide 100 MG Tabs Place 1 tablet (100 mg total) into feeding tube 2 (two) times daily. Replaces: lacosamide 10 MG/ML oral solution   levETIRAcetam 100 MG/ML solution Commonly known as: KEPPRA Place 10 mLs (1,000 mg total) into feeding tube 2 (two) times daily. What changed:  how much to take Another medication with the same name was removed. Continue taking this medication, and follow the directions you see here.   pantoprazole sodium 40 mg Pack Commonly known as: PROTONIX Place 20 mLs (40 mg total) into feeding tube daily.         Allergies  Allergen Reactions   Hydrocodone Nausea Only     Procedures /Studies:   CT Head Wo Contrast  Result Date: 01/21/2021 CLINICAL DATA:  Mental status change, unknown cause EXAM: CT HEAD WITHOUT CONTRAST TECHNIQUE: Contiguous axial images were obtained from the base of the skull through the vertex without intravenous contrast. 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 Ex vacuo dilatation of the left lateral ventricle. No acute infarct or hemorrhage. Vascular: Embolization coils seen in the left middle cerebral artery. No hyperdense vessel. Skull: Prior large left craniectomy. Sinuses/Orbits: No acute findings Other: None IMPRESSION: Prior left craniectomy with large area of encephalomalacia throughout much of the left cerebral hemisphere. Ex vacuo dilatation of the left lateral ventricle. No acute intracranial abnormality. Electronically Signed   By: Charlett Nose M.D.   On: 01/21/2021 19:38   CT Abdomen Pelvis W Contrast  Result Date: 01/21/2021 CLINICAL DATA:  Vomiting peg tube EXAM: CT ABDOMEN AND PELVIS WITH CONTRAST TECHNIQUE: Multidetector CT imaging of the abdomen and pelvis was performed using the standard protocol following bolus administration of intravenous contrast. CONTRAST:  81mL OMNIPAQUE IOHEXOL 350 MG/ML SOLN COMPARISON:  None. FINDINGS: Lower chest: Lung bases demonstrate partial lower lobe consolidation which may reflect atelectasis or aspiration. No pleural effusion. Cardiomegaly. Hepatobiliary: Subcentimeter hypodense lesion posterior right hepatic lobe too small to further characterize. No calcified gallstone or biliary dilatation Pancreas: Pancreatic enlargement with marked peripancreatic inflammatory change and fluid consistent with acute pancreatitis. No organized fluid collections. Homogeneous enhancement. Spleen: Normal in size without focal abnormality. Adrenals/Urinary  Tract: Adrenal glands are normal. Cyst lower pole left kidney. Small bilateral kidney stones. The bladder is thick walled and indistinct. Stomach/Bowel: Stomach nonenlarged. Mild wall thickening and mucosal enhancement of distal stomach and first portion of duodenum. No dilated small bowel. Hyperdense material in the colon. No acute bowel wall thickening. Gastrostomy tube in the distal body of the stomach. Vascular/Lymphatic: Nonaneurysmal aorta with mild atherosclerosis. No suspicious lymph nodes. Portal and splenic veins normally enhance. Reproductive: Prominent size uterus. Endometrial stripe slightly thickened up to 11 mm with focal hypodense area in the posterior lower uterine segment questionable for fibroid. No adnexal mass Other: No free air. Small free fluid in the abdomen and pelvis. 11 by 12 by 9.4 cm suspected foreign body within the subcutaneous fat of the left anterior abdominal wall with small adjacent fluid. This is seen at the level of the umbilicus. Evidence of prior ventral hernia repair with small fat containing recurrent ventral hernia. Musculoskeletal: No acute osseous abnormality IMPRESSION: 1. Findings consistent with acute pancreatitis. No organized fluid collection or evidence for necrosis at this  time. Small amount of abdominopelvic ascites 2. Gastrostomy tube present in the distal body of stomach. Mild wall thickening and mucosal enhancement of distal stomach and first portion of duodenum likely reflecting gastritis/duodenitis versus reactive inflammation from adjacent inflamed pancreas 3. 11 x 12 x 9.4 cm suspected foreign body within the subcutaneous fat of the left anterior body wall at the level of the umbilicus. Small amount of fluid surrounding the foreign body. 4. Cardiomegaly. Patchy atelectasis or aspiration changes at the bases. 5. Thickened endometrial stripe with slightly enlarged appearing uterus for age. Possible hypoechoic mass within the posterior lower uterine segment.  Recommend correlation with nonemergent ultrasound. Electronically Signed   By: Jasmine Pang M.D.   On: 01/21/2021 19:47   DG Chest Port 1 View  Result Date: 01/21/2021 CLINICAL DATA:  Altered mental status. EXAM: PORTABLE CHEST 1 VIEW COMPARISON:  July 15, 2020. FINDINGS: Stable cardiomediastinal silhouette. Tracheostomy tube has been removed. Both lungs are clear. The visualized skeletal structures are unremarkable. IMPRESSION: No active disease. Electronically Signed   By: Lupita Raider M.D.   On: 01/21/2021 16:27   US Abdomen Limited RUQ (LIVER/GB)  Result Date: 01/22/2021 CLINICAL DATA:  Acute pancreatitis EXAM: ULTRASOUND ABDOMEN LIMITED RIGHT UPPER QUADRANT COMPARISON:  CT abdomen 01/21/2021 FINDINGS: Gallbladder: Gallbladder wall is within normal limits at 0.2 cm. No gallstones or sonographic Murphy sign identified. Common bile duct: Diameter: 0.3 cm Liver: Mildly coarsened echotexture, without substantial focal lesion identified. Portal vein is patent on color Doppler imaging with normal direction of blood flow towards the liver. Other: Trace perihepatic ascites. IMPRESSION: 1. No findings of gallstones or biliary dilatation. 2. Trace perihepatic ascites. 3. Subtle coarsening of the echotexture of the liver is nonspecific but can be seen in a variety of conditions including hepatitis, metabolic abnormalities, cirrhosis, and hemochromatosis. Electronically Signed   By: Gaylyn Rong M.D.   On: 01/22/2021 10:46    Subjective:   Patient was seen and examined 02/01/2021, 11:35 AM Patient stable today. No acute distress.  No issues overnight Stable for discharge.  Discharge Exam:    Vitals:   01/31/21 1347 01/31/21 2130 02/01/21 0421 02/01/21 0500  BP: 137/81 (!) 146/108 (!) 136/93   Pulse: 76 95 (!) 105   Resp: 16 18 18    Temp: 99.1 F (37.3 C) 98.2 F (36.8 C) 98.3 F (36.8 C)   TempSrc: Oral Oral Oral   SpO2: 100% 100% 96%   Weight:    97.7 kg  Height:         General: Patient is at baseline nonverbal not communicating not ambulating needs assist with all ADLs Pt lying comfortably in bed & appears in no obvious distress. Cardiovascular: S1 & S2 heard, RRR, S1/S2 +. No murmurs, rubs, gallops or clicks. No JVD or pedal edema. Respiratory: Clear to auscultation without wheezing, rhonchi or crackles. No increased work of breathing. Abdominal:  Non-distended, non-tender & soft. No organomegaly or masses appreciated. Normal bowel sounds heard. CNS: Alert and oriented. No focal deficits. Extremities: no edema, no cyanosis      The results of significant diagnostics from this hospitalization (including imaging, microbiology, ancillary and laboratory) are listed below for reference.      Microbiology:   Recent Results (from the past 240 hour(s))  Resp Panel by RT-PCR (Flu A&B, Covid) Nasopharyngeal Swab     Status: None   Collection Time: 01/31/21 10:26 AM   Specimen: Nasopharyngeal Swab; Nasopharyngeal(NP) swabs in vial transport medium  Result Value Ref Range Status  SARS Coronavirus 2 by RT PCR NEGATIVE NEGATIVE Final    Comment: (NOTE) SARS-CoV-2 target nucleic acids are NOT DETECTED.  The SARS-CoV-2 RNA is generally detectable in upper respiratory specimens during the acute phase of infection. The lowest concentration of SARS-CoV-2 viral copies this assay can detect is 138 copies/mL. A negative result does not preclude SARS-Cov-2 infection and should not be used as the sole basis for treatment or other patient management decisions. A negative result may occur with  improper specimen collection/handling, submission of specimen other than nasopharyngeal swab, presence of viral mutation(s) within the areas targeted by this assay, and inadequate number of viral copies(<138 copies/mL). A negative result must be combined with clinical observations, patient history, and epidemiological information. The expected result is Negative.  Fact  Sheet for Patients:  BloggerCourse.com  Fact Sheet for Healthcare Providers:  SeriousBroker.it  This test is no t yet approved or cleared by the [place] FDA and  has been authorized for detection and/or diagnosis of SARS-CoV-2 by FDA under an Emergency Use Authorization (EUA). This EUA will remain  in effect (meaning this test can be used) for the duration of the COVID-19 declaration under Section 564(b)(1) of the Act, 21 U.S.C.section 360bbb-3(b)(1), unless the authorization is terminated  or revoked sooner.       Influenza A by PCR NEGATIVE NEGATIVE Final   Influenza B by PCR NEGATIVE NEGATIVE Final    Comment: (NOTE) The Xpert Xpress SARS-CoV-2/FLU/RSV plus assay is intended as an aid in the diagnosis of influenza from Nasopharyngeal swab specimens and should not be used as a sole basis for treatment. Nasal washings and aspirates are unacceptable for Xpert Xpress SARS-CoV-2/FLU/RSV testing.  Fact Sheet for Patients: BloggerCourse.com  Fact Sheet for Healthcare Providers: SeriousBroker.it  This test is not yet approved or cleared by the [place] FDA and has been authorized for detection and/or diagnosis of SARS-CoV-2 by FDA under an Emergency Use Authorization (EUA). This EUA will remain in effect (meaning this test can be used) for the duration of the COVID-19 declaration under Section 564(b)(1) of the Act, 21 U.S.C. section 360bbb-3(b)(1), unless the authorization is terminated or revoked.  Performed at [facility], [address], [place], [place] [address]      Labs:   CBC: Recent Labs  Lab 01/26/21 0336  WBC 9.5  HGB 11.3*  HCT 35.5*  MCV 94.2  PLT 163   Basic Metabolic Panel: Recent Labs  Lab 01/27/21 0806 01/29/21 1237 01/30/21 1008 01/31/21 0547 02/01/21 0531  NA 138 138 140 140 140  K 3.4* 2.8* 2.7* 2.7* 3.7  CL 104 98 99 97* 101   CO2 30 33* 35* 34* 31  GLUCOSE 108* 132* 121* 114* 96  BUN 10 22* 24* 23* 23*  CREATININE 0.37* 0.53 0.48 0.43* 0.44  CALCIUM 9.5 9.8 9.5 9.6 9.9  MG  --   --   --  1.6*  --    Liver Function Tests: Recent Labs  Lab 01/26/21 0336 01/27/21 0806 01/29/21 1237 01/30/21 1008  AST 25 34 37 35  ALT 9 10 13 11   ALKPHOS 48 47 56 56  BILITOT 0.4 0.5 0.3 0.4  PROT 5.3* 5.3* 6.3* 6.2*  ALBUMIN 1.7* 1.7* 2.0* 2.0*   BNP (last 3 results) No results for input(s): BNP in the last 8760 hours. Cardiac Enzymes: No results for input(s): CKTOTAL, CKMB, CKMBINDEX, TROPONINI in the last 168 hours. CBG: Recent Labs  Lab 01/31/21 2128 02/01/21 0046 02/01/21 0418 02/01/21 0723 02/01/21 1121  GLUCAP 109* 100* 112* 114* 118*   Hgb A1c No results for input(s): HGBA1C in the last 72 hours. Lipid Profile No results for input(s): CHOL, HDL, LDLCALC, TRIG, CHOLHDL, LDLDIRECT in the last 72 hours. Thyroid function studies No results for input(s): TSH, T4TOTAL, T3FREE, THYROIDAB in the last 72 hours.  Invalid input(s): FREET3 Anemia work up No results for input(s): VITAMINB12, FOLATE, FERRITIN, TIBC, IRON, RETICCTPCT in the last 72 hours. Urinalysis    Component Value Date/Time   COLORURINE YELLOW 07/16/2020 0418   APPEARANCEUR CLEAR 07/16/2020 0418   LABSPEC 1.026 07/16/2020 0418   PHURINE 5.0 07/16/2020 0418   GLUCOSEU NEGATIVE 07/16/2020 0418   HGBUR NEGATIVE 07/16/2020 0418   BILIRUBINUR NEGATIVE 07/16/2020 0418   KETONESUR NEGATIVE 07/16/2020 0418   PROTEINUR NEGATIVE 07/16/2020 0418   NITRITE NEGATIVE 07/16/2020 0418   LEUKOCYTESUR TRACE (A) 07/16/2020 0418         Time coordinating discharge: Over 55 minutes  SIGNED: Kendell Bane, MD, FACP, FHM. [facility],  Please use [email or web address] to Page If 7PM-7AM, please contact night-coverage [email or web address] 02/01/2021, 11:35 AM

## 2021-02-01 NOTE — TOC Transition Note (Signed)
Transition of Care [facility]) - CM/SW Discharge Note   Patient Details  Name: Denise Savage MRN: [id number] Date of Birth: 1961/03/21  [facility]) CM/SW Contact:  Annice Needy, LCSW Phone Number: 02/01/2021, 12:30 PM   Clinical Narrative:    Daughter, Denise Savage, advised of discharge once facility receives insurance authorization.  Debbie at [employer] authorization, expects to receive today.    Final next level of care: Skilled Nursing Facility Barriers to Discharge: Insurance Authorization   Patient Goals and CMS Choice        Discharge Placement                       Discharge Plan and Services                                     Social Determinants of Health (SDOH) Interventions     Readmission Risk Interventions Readmission Risk Prevention Plan 09/08/2020  Transportation Screening Complete  PCP or Specialist Appt within 5-7 Days Complete  Home Care Screening Complete  Medication Review (RN CM) Complete  Some recent data might be hidden

## 2021-02-02 DIAGNOSIS — K859 Acute pancreatitis without necrosis or infection, unspecified: Secondary | ICD-10-CM | POA: Diagnosis not present

## 2021-02-02 DIAGNOSIS — R9431 Abnormal electrocardiogram [ECG] [EKG]: Secondary | ICD-10-CM | POA: Diagnosis not present

## 2021-02-02 DIAGNOSIS — K853 Drug induced acute pancreatitis without necrosis or infection: Secondary | ICD-10-CM | POA: Diagnosis not present

## 2021-02-02 DIAGNOSIS — R569 Unspecified convulsions: Secondary | ICD-10-CM | POA: Diagnosis not present

## 2021-02-02 DIAGNOSIS — I608 Other nontraumatic subarachnoid hemorrhage: Secondary | ICD-10-CM | POA: Diagnosis not present

## 2021-02-02 DIAGNOSIS — R1310 Dysphagia, unspecified: Secondary | ICD-10-CM | POA: Diagnosis not present

## 2021-02-02 DIAGNOSIS — R9389 Abnormal findings on diagnostic imaging of other specified body structures: Secondary | ICD-10-CM | POA: Diagnosis not present

## 2021-02-02 DIAGNOSIS — I2699 Other pulmonary embolism without acute cor pulmonale: Secondary | ICD-10-CM | POA: Diagnosis not present

## 2021-02-02 LAB — BASIC METABOLIC PANEL
Anion gap: 7 (ref 5–15)
BUN: 27 mg/dL — ABNORMAL HIGH (ref 6–20)
CO2: 30 mmol/L (ref 22–32)
Calcium: 9.5 mg/dL (ref 8.9–10.3)
Chloride: 103 mmol/L (ref 98–111)
Creatinine, Ser: 0.49 mg/dL (ref 0.44–1.00)
GFR, Estimated: >60 mL/min (ref >60)
Glucose, Bld: 110 mg/dL — ABNORMAL HIGH (ref 70–99)
Potassium: 3.3 mmol/L — ABNORMAL LOW (ref 3.5–5.1)
Sodium: 140 mmol/L (ref 135–145)

## 2021-02-02 LAB — GLUCOSE, CAPILLARY
Glucose-Capillary: 98 mg/dL (ref 70–99)
Glucose-Capillary: 96 mg/dL (ref 70–99)
Glucose-Capillary: 106 mg/dL — ABNORMAL HIGH (ref 70–99)
Glucose-Capillary: 113 mg/dL — ABNORMAL HIGH (ref 70–99)

## 2021-02-02 MED ORDER — POTASSIUM CHLORIDE 20 MEQ PO PACK: 20.0000 meq | PACK | Freq: Every day | ORAL | 0 refills | Status: DC

## 2021-02-02 MED ORDER — POTASSIUM CHLORIDE 20 MEQ PO PACK
20.0000 meq | PACK | Freq: Every day | ORAL | Status: DC
  Administered 2021-02-02: 20 meq via ORAL
  Filled 2021-02-02: qty 1

## 2021-02-02 NOTE — Progress Notes (Signed)
Called report to [facility] in [employer]. Will continue to monitor patient until transport arrives to pick her up to take her to the facility. Family is aware.

## 2021-02-02 NOTE — Discharge Summary (Signed)
Physician Discharge Summary [facility]    Patient: Denise Savage                   Admit date: 01/21/2021   DOB: [birth year]             Discharge date:02/02/2021/11:19 AM ZOX:[id number]                          PCP: Charlynne Pander, MD  Disposition:  SNF Recommendations for Outpatient Follow-up:   Follow up: With PCP, neurosurgery within the next 3-4 weeks Check BMP in 1 to 2 days, monitor potassium electro levels closely and weekly with Continue tube feeds Aspiration precaution  Discharge Condition: Stable   Code Status:   Code Status: Full Code  Diet recommendation: Continue current tube feeds:  Osm 1.5 ml/hr at 55 ml/hr PTA.  protein 30 ml BID  Free water 150 ml q 6hr.  Aspiration precaution   Patient was seen and examined this morning, stable awake alert, cleared for discharge back to SNF  Discharge Diagnoses:    Principal Problem:   Acute pancreatitis Active Problems:   Aneurysmal subarachnoid hemorrhage (HCC)   Seizures (HCC)   Pulmonary embolus (HCC)   Dysphagia   Prolonged QT interval   Thickened endometrium   History of Present Illness/ Hospital Course Charline Bills Summary:   Denise Savage is a 60 y.o. 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  Patient was noted to develop nausea and vomiting this morning at her SNF, tube feeds were stopped, but she continued to have nausea and vomiting was sent to the ED.  She has never had pancreatitis before per report of her family.  Neurologically, her family reports that she has been making improvements since her ruptured aneurysm in January but is often nonverbal and when she does try to speak, cannot be understood.  Family suspects that she was in some pain today as they  noted her blood pressure to be higher than normal.  Patient seemed more somnolent than usual today.    ED Course: Upon arrival to the ED, patient is found to be afebrile, saturating well on room air, and with stable blood pressure.  EKG features sinus rhythm with QTc interval of 635 ms.  Chemistry panel notable for elevated BUN to creatinine ratio.  CBC with mild polycythemia and slight thrombocytopenia.  Lactic acid normal.  Lipase 643.  Head CT negative for acute findings.  CT of the abdomen and pelvis concerning for acute pancreatitis without organized fluid collection or necrosis.  Patient was given IV fluids and Zofran in the ED.     1. Acute pancreatitis /gastro duodenitis -Resolved  - Lipase of 643 >>> 326 - CT findings consistent with acute pancreatitis as well as gastritis and duodenitis - LFTs normal, no biliary dilation on CT  -Right upper quadrant ultrasound without cholelithiasis or cholecystitis findings - Triglycerides 91, lactic acid 1.9 -   -Patient was restarted on tube feed on 01/24/2021, (improved lipase level) -Discussed with GI service--optimizing tube feeds     Osm 1.5 ml/hr at 55 ml/hr PTA.  protein 30 ml BID  Free water 150 ml q 6hr.  -Remained stable   2. Prolonged QT interval  -Much improved - QTc is 635  ms in ED  -Repeat EKG on 01/23/2021 with QTC less than 350 -Keep magnesium close to 2 and keep potassium close to 4   3. PE   - Admitted 11/18/20 with small RLL PE without heart strain and was discharged on Eliquis  - Continue Eliquis       4. Seizures--status post prior craniectomy -very high risk for seizures, status post craniectomy, patient's Skull is currently in her left abdominal area, plan is to put her skull  back at a later date -PTA patient is on Vimpat, Keppra and Depakote (during hospitalization medications were given IV) Switching back to PEG tube -Neurologist consulted: It was tapered off Depakote  - increase Vimpat, and continue Keppra  via PEG tube -Stable     5. 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 Endometrial thickening and possible fibroid - Noted incidentally on CT, non-emergent US recommended  -Discussed with patient's son Denise Savage,  -Treatment as above   7) hypoglycemia due to n.p.o. status--- resolved as tube feeds resumed   8) hypercalcemia--- episodes of hypercalcemia from time to time usually improves with hydration   9) Hypokalemia -monitoring and replete accordingly     disposition--- discharge back to SNF when tolerating tube feed well   ----------------------------------------------------------------------------------------------------------------------------------------------- Nutritional status:  The patient's BMI is: Body mass index is 37.92 kg/m. I agree with the assessment and plan as outlined below: Nutrition Status: Nutrition Problem: Inadequate oral intake Etiology: inability to eat Signs/Symptoms: NPO status Interventions: Tube feeding, Prostat ------------------------------------------------------------------------------------------------------------------------------------------------ Cultures / Antimicrobials: none    Consultants: Allergy/gastroenterology   ------------------------------------------------------------------------------------------------------------------------------------------------   DVT prophylaxis: continue SCD/Compression stockings and Eliquis Code Status:   Code Status: Full Code   Family Communication: Discussed with her son Denise Savage at bedside  Called her daughter left a message   Admission status:      Dispo: The patient is from: SNF              Anticipated d/c is to: SNF       Discharge Instructions:   Discharge Instructions     Activity as tolerated - No restrictions   Complete by: As directed    Discharge instructions   Complete by: As directed    Continue current medication as prescribed.Marland Kitchen.  BMP in 2 days, monitor potassium level closely -Need to be repositioned every 2 hours -Continue tube feeds as instructed -Follow-up with neurosurgery as scheduled -Recommending palliative consultation and follow-up   Increase activity slowly   Complete by: As directed         Medication List     STOP taking these medications    HYDROcodone-acetaminophen 7.5-325 MG tablet Commonly known as: NORCO   lacosamide 10 MG/ML oral solution Commonly known as: VIMPAT Replaced by: Lacosamide 100 MG Tabs   losartan 25 MG tablet Commonly known as: COZAAR   tamsulosin 0.4 MG Caps capsule Commonly known as: FLOMAX   valproic acid 250 MG/5ML solution Commonly known as: DEPAKENE       TAKE these medications    acetaminophen 325 MG tablet Commonly known as: TYLENOL Place 2 tablets (650 mg total) into feeding tube every 6 (six) hours as needed for mild pain (or Fever >/= 101).   Eliquis 5 MG Tabs tablet Generic drug: apixaban Take 5 mg by  mouth 2 (two) times daily.   feeding supplement (PROSource TF) liquid Place 90 mLs into feeding tube 4 (four) times daily.   feeding supplement (OSMOLITE 1.5 CAL) Liqd Place 1,000 mLs into feeding tube daily.   free water Soln Place 30 mLs into feeding tube every 4 (four) hours.   Lacosamide 100 MG Tabs Place 1 tablet (100 mg total) into feeding tube 2 (two) times daily. Replaces: lacosamide 10 MG/ML oral solution   levETIRAcetam 100 MG/ML solution Commonly known as: KEPPRA Place 10 mLs (1,000 mg total) into feeding tube 2 (two) times daily. What changed:  how much to take Another medication with the same name was removed. Continue taking this medication, and follow the directions you see here.    pantoprazole sodium 40 mg Pack Commonly known as: PROTONIX Place 20 mLs (40 mg total) into feeding tube daily.   potassium chloride 20 MEQ packet Commonly known as: KLOR-CON Take 20 mEq by mouth daily for 10 days.        Contact information for after-discharge care     Destination     [facility] .   Service: Skilled Nursing Contact information: [address] [place] [address] [phone]                    Allergies  Allergen Reactions   Hydrocodone Nausea Only     Procedures /Studies:   CT Head Wo Contrast  Result Date: 01/21/2021 CLINICAL DATA:  Mental status change, unknown cause EXAM: CT HEAD WITHOUT CONTRAST TECHNIQUE: Contiguous axial images were obtained from the base of the skull through the vertex without intravenous contrast. 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 Ex vacuo dilatation of the left lateral ventricle. No acute infarct or hemorrhage. Vascular: Embolization coils seen in the left middle cerebral artery. No hyperdense vessel. Skull: Prior large left craniectomy. Sinuses/Orbits: No acute findings Other: None IMPRESSION: Prior left craniectomy with large area of encephalomalacia throughout much of the left cerebral hemisphere. Ex vacuo dilatation of the left lateral ventricle. No acute intracranial abnormality. Electronically Signed   By: Charlett Nose M.D.   On: 01/21/2021 19:38   CT Abdomen Pelvis W Contrast  Result Date: 01/21/2021 CLINICAL DATA:  Vomiting peg tube EXAM: CT ABDOMEN AND PELVIS WITH CONTRAST TECHNIQUE: Multidetector CT imaging of the abdomen and pelvis was performed using the standard protocol following bolus administration of intravenous contrast. CONTRAST:  27mL OMNIPAQUE IOHEXOL 350 MG/ML SOLN COMPARISON:  None. FINDINGS: Lower chest: Lung bases demonstrate partial lower lobe consolidation  which may reflect atelectasis or aspiration. No pleural effusion. Cardiomegaly. Hepatobiliary: Subcentimeter hypodense lesion posterior right hepatic lobe too small to further characterize. No calcified gallstone or biliary dilatation Pancreas: Pancreatic enlargement with marked peripancreatic inflammatory change and fluid consistent with acute pancreatitis. No organized fluid collections. Homogeneous enhancement. Spleen: Normal in size without focal abnormality. Adrenals/Urinary Tract: Adrenal glands are normal. Cyst lower pole left kidney. Small bilateral kidney stones. The bladder is thick walled and indistinct. Stomach/Bowel: Stomach nonenlarged. Mild wall thickening and mucosal enhancement of distal stomach and first portion of duodenum. No dilated small bowel. Hyperdense material in the colon. No acute bowel wall thickening. Gastrostomy tube in the distal body of the stomach. Vascular/Lymphatic: Nonaneurysmal aorta with mild atherosclerosis. No suspicious lymph nodes. Portal and splenic veins normally enhance. Reproductive: Prominent size uterus. Endometrial stripe slightly thickened up to 11 mm with focal hypodense area in the  posterior lower uterine segment questionable for fibroid. No adnexal mass Other: No free air. Small free fluid in the abdomen and pelvis. 11 by 12 by 9.4 cm suspected foreign body within the subcutaneous fat of the left anterior abdominal wall with small adjacent fluid. This is seen at the level of the umbilicus. Evidence of prior ventral hernia repair with small fat containing recurrent ventral hernia. Musculoskeletal: No acute osseous abnormality IMPRESSION: 1. Findings consistent with acute pancreatitis. No organized fluid collection or evidence for necrosis at this time. Small amount of abdominopelvic ascites 2. Gastrostomy tube present in the distal body of stomach. Mild wall thickening and mucosal enhancement of distal stomach and first portion of duodenum likely reflecting  gastritis/duodenitis versus reactive inflammation from adjacent inflamed pancreas 3. 11 x 12 x 9.4 cm suspected foreign body within the subcutaneous fat of the left anterior body wall at the level of the umbilicus. Small amount of fluid surrounding the foreign body. 4. Cardiomegaly. Patchy atelectasis or aspiration changes at the bases. 5. Thickened endometrial stripe with slightly enlarged appearing uterus for age. Possible hypoechoic mass within the posterior lower uterine segment. Recommend correlation with nonemergent ultrasound. Electronically Signed   By: Jasmine Pang M.D.   On: 01/21/2021 19:47   DG Chest Port 1 View  Result Date: 01/21/2021 CLINICAL DATA:  Altered mental status. EXAM: PORTABLE CHEST 1 VIEW COMPARISON:  July 15, 2020. FINDINGS: Stable cardiomediastinal silhouette. Tracheostomy tube has been removed. Both lungs are clear. The visualized skeletal structures are unremarkable. IMPRESSION: No active disease. Electronically Signed   By: Lupita Raider M.D.   On: 01/21/2021 16:27   US Abdomen Limited RUQ (LIVER/GB)  Result Date: 01/22/2021 CLINICAL DATA:  Acute pancreatitis EXAM: ULTRASOUND ABDOMEN LIMITED RIGHT UPPER QUADRANT COMPARISON:  CT abdomen 01/21/2021 FINDINGS: Gallbladder: Gallbladder wall is within normal limits at 0.2 cm. No gallstones or sonographic Murphy sign identified. Common bile duct: Diameter: 0.3 cm Liver: Mildly coarsened echotexture, without substantial focal lesion identified. Portal vein is patent on color Doppler imaging with normal direction of blood flow towards the liver. Other: Trace perihepatic ascites. IMPRESSION: 1. No findings of gallstones or biliary dilatation. 2. Trace perihepatic ascites. 3. Subtle coarsening of the echotexture of the liver is nonspecific but can be seen in a variety of conditions including hepatitis, metabolic abnormalities, cirrhosis, and hemochromatosis. Electronically Signed   By: Gaylyn Rong M.D.   On: 01/22/2021 10:46     Subjective:   Patient was seen and examined 02/02/2021, 11:19 AM Patient stable today. No acute distress.  No issues overnight Stable for discharge.  Discharge Exam:    Vitals:   02/01/21 0500 02/01/21 1352 02/01/21 2156 02/02/21 0519  BP:  130/79 122/70 (!) 96/52  Pulse:  92 (!) 105 92  Resp:  18 20 18   Temp:  99.6 F (37.6 C) 98.9 F (37.2 C) 98 F (36.7 C)  TempSrc:   Oral   SpO2:  96% 97%   Weight: 97.7 kg     Height:       No changes  General: Patient is at baseline nonverbal not communicating not ambulating needs assist with all ADLs Pt lying comfortably in bed & appears in no obvious distress. Cardiovascular: S1 & S2 heard, RRR, S1/S2 +. No murmurs, rubs, gallops or clicks. No JVD or pedal edema. Respiratory: Clear to auscultation without wheezing, rhonchi or crackles. No increased work of breathing. Abdominal:  Non-distended, non-tender & soft. No organomegaly or masses appreciated. Normal bowel sounds heard. CNS: Alert  and oriented. No focal deficits. Extremities: no edema, no cyanosis      The results of significant diagnostics from this hospitalization (including imaging, microbiology, ancillary and laboratory) are listed below for reference.      Microbiology:   Recent Results (from the past 240 hour(s))  Resp Panel by RT-PCR (Flu A&B, Covid) Nasopharyngeal Swab     Status: None   Collection Time: 01/31/21 10:26 AM   Specimen: Nasopharyngeal Swab; Nasopharyngeal(NP) swabs in vial transport medium  Result Value Ref Range Status   SARS Coronavirus 2 by RT PCR NEGATIVE NEGATIVE Final    Comment: (NOTE) SARS-CoV-2 target nucleic acids are NOT DETECTED.  The SARS-CoV-2 RNA is generally detectable in upper respiratory specimens during the acute phase of infection. The lowest concentration of SARS-CoV-2 viral copies this assay can detect is 138 copies/mL. A negative result does not preclude SARS-Cov-2 infection and should not be used as the sole basis for  treatment or other patient management decisions. A negative result may occur with  improper specimen collection/handling, submission of specimen other than nasopharyngeal swab, presence of viral mutation(s) within the areas targeted by this assay, and inadequate number of viral copies(<138 copies/mL). A negative result must be combined with clinical observations, patient history, and epidemiological information. The expected result is Negative.  Fact Sheet for Patients:  BloggerCourse.com  Fact Sheet for Healthcare Providers:  SeriousBroker.it  This test is no t yet approved or cleared by the [place] FDA and  has been authorized for detection and/or diagnosis of SARS-CoV-2 by FDA under an Emergency Use Authorization (EUA). This EUA will remain  in effect (meaning this test can be used) for the duration of the COVID-19 declaration under Section 564(b)(1) of the Act, 21 U.S.C.section 360bbb-3(b)(1), unless the authorization is terminated  or revoked sooner.       Influenza A by PCR NEGATIVE NEGATIVE Final   Influenza B by PCR NEGATIVE NEGATIVE Final    Comment: (NOTE) The Xpert Xpress SARS-CoV-2/FLU/RSV plus assay is intended as an aid in the diagnosis of influenza from Nasopharyngeal swab specimens and should not be used as a sole basis for treatment. Nasal washings and aspirates are unacceptable for Xpert Xpress SARS-CoV-2/FLU/RSV testing.  Fact Sheet for Patients: BloggerCourse.com  Fact Sheet for Healthcare Providers: SeriousBroker.it  This test is not yet approved or cleared by the [place] FDA and has been authorized for detection and/or diagnosis of SARS-CoV-2 by FDA under an Emergency Use Authorization (EUA). This EUA will remain in effect (meaning this test can be used) for the duration of the COVID-19 declaration under Section 564(b)(1) of the Act, 21  U.S.C. section 360bbb-3(b)(1), unless the authorization is terminated or revoked.  Performed at [facility], [address] [place], [place] [address]      Labs:   CBC: No results for input(s): WBC, NEUTROABS, HGB, HCT, MCV, PLT in the last 168 hours.  Basic Metabolic Panel: Recent Labs  Lab 01/29/21 1237 01/30/21 1008 01/31/21 0547 02/01/21 0531 02/02/21 0507  NA 138 140 140 140 140  K 2.8* 2.7* 2.7* 3.7 3.3*  CL 98 99 97* 101 103  CO2 33* 35* 34* 31 30  GLUCOSE 132* 121* 114* 96 110*  BUN 22* 24* 23* 23* 27*  CREATININE 0.53 0.48 0.43* 0.44 0.49  CALCIUM 9.8 9.5 9.6 9.9 9.5  MG  --   --  1.6*  --   --    Liver Function Tests: Recent Labs  Lab 01/27/21 0806 01/29/21 1237 01/30/21 1008  AST 34 37 35  ALT 10 13 11   ALKPHOS 47 56 56  BILITOT 0.5 0.3 0.4  PROT 5.3* 6.3* 6.2*  ALBUMIN 1.7* 2.0* 2.0*   BNP (last 3 results) No results for input(s): BNP in the last 8760 hours. Cardiac Enzymes: No results for input(s): CKTOTAL, CKMB, CKMBINDEX, TROPONINI in the last 168 hours. CBG: Recent Labs  Lab 02/01/21 2154 02/02/21 0117 02/02/21 0359 02/02/21 0747 02/02/21 1105  GLUCAP 100* 98 96 106* 113*   Urinalysis    Component Value Date/Time   COLORURINE YELLOW 07/16/2020 0418   APPEARANCEUR CLEAR 07/16/2020 0418   LABSPEC 1.026 07/16/2020 0418   PHURINE 5.0 07/16/2020 0418   GLUCOSEU NEGATIVE 07/16/2020 0418   HGBUR NEGATIVE 07/16/2020 0418   BILIRUBINUR NEGATIVE 07/16/2020 0418   KETONESUR NEGATIVE 07/16/2020 0418   PROTEINUR NEGATIVE 07/16/2020 0418   NITRITE NEGATIVE 07/16/2020 0418   LEUKOCYTESUR TRACE (A) 07/16/2020 0418       Time coordinating discharge: Over 55 minutes  SIGNED: 09/13/2020, MD, FACP, FHM. [facility],  Please use [email or web address] to Page If 7PM-7AM, please contact night-coverage [email or web address] 02/02/2021, 11:19 AM

## 2021-02-02 NOTE — TOC Transition Note (Signed)
Transition of Care [facility]) - CM/SW Discharge Note   Patient Details  Name: Denise Savage MRN: [id number] Date of Birth: September 11, 1960  [facility] CM/SW Contact:  Annice Needy, LCSW Phone Number: 02/02/2021, 11:33 AM   Clinical Narrative:    EMS has been called to transport to [place]. Message sent to daughter, [name], regarding discharge. TOC signing off.     Final next level of care: Skilled Nursing Facility Barriers to Discharge: Insurance Authorization   Patient Goals and CMS Choice        Discharge Placement                       Discharge Plan and Services                                     Social Determinants of Health (SDOH) Interventions     Readmission Risk Interventions Readmission Risk Prevention Plan 09/08/2020  Transportation Screening Complete  PCP or Specialist Appt within 5-7 Days Complete  Home Care Screening Complete  Medication Review (RN CM) Complete  Some recent data might be hidden

## 2021-02-22 ENCOUNTER — Other Ambulatory Visit: Payer: Self-pay | Admitting: Neurosurgery

## 2021-03-10 ENCOUNTER — Other Ambulatory Visit: Payer: Self-pay | Admitting: Neurosurgery

## 2021-03-11 ENCOUNTER — Encounter (HOSPITAL_COMMUNITY): Payer: Self-pay | Admitting: Radiology

## 2021-03-15 ENCOUNTER — Encounter (HOSPITAL_COMMUNITY): Payer: Self-pay | Admitting: Neurosurgery

## 2021-03-17 ENCOUNTER — Encounter (HOSPITAL_COMMUNITY): Payer: Self-pay | Admitting: Neurosurgery

## 2021-03-17 NOTE — Progress Notes (Signed)
Informed Annasha, LPN at [facility] that the patient's 03/18/21 surgery is cancelled per MD due to not stopping Eliquis.  Dr Val Riles office will let [facility] know when they reschedule her surgery.  Informed Nurse Marissa Nestle that patient has to stop Eliquis 3 days prior to the surgical procedure.  April from OR desk notified of cancellation.  Lindsi, RN from [facility] informed of cancellation.

## 2021-03-17 NOTE — Progress Notes (Signed)
Called Dr Val Riles Office, spoke with [place].  Informed her that patient has not stopped Eliquis.  Last dose of Eliquis was today, 03/17/21.  Clair Gulling will talk with MD and call me back concerning 03/18/21 surgery.

## 2021-03-17 NOTE — Progress Notes (Signed)
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 PE in June (notes in care everywhere) now on Eliquis  Uses PEG for nutrition  Hospitalized 8/11-23/22 for acute pancreatitis  Trach stoma closed in April 2022 (notes in care everywhere)   Hospitalized 06/15/20-09/10/20 for [facility], ruptured aneurysm.  S/p craniectomy with bone flap in abdomen. Complicated by acute respiratory failure (s/p trach 1/19), profound paraparesis, subclinical seizures, pneumonia, UTI. PEG tube placed.   Resides in SNF  Surgery was originally scheduled for 03/18/21 but was postponed because pt did not stop eliquis.    PROVIDERS: - PCP is Charlynne Pander, MD   LABS:  BMP 02/02/21 acceptable for surgery CBC 01/26/21 acceptable for surgery   IMAGES: 1 view CXR 01/21/21: No active disease.   EKG 01/23/21: Sinus rhythm with short PR. Nonspecific T wave abnormality   CV: Echo 06/22/20:  1. Left ventricular ejection fraction, by estimation, is 60 to 65%. The left ventricle has normal function. The left ventricle has no regional wall motion abnormalities. Left ventricular diastolic parameters were normal.  2. Right ventricular systolic function is normal. The right ventricular size is normal. Tricuspid regurgitation signal is inadequate for assessing PA pressure.  3. The mitral valve is normal in structure. No evidence of mitral valve regurgitation. No evidence of mitral stenosis.  4. The aortic valve is normal in structure. Aortic valve regurgitation is not visualized. No aortic stenosis is present.  5. The  inferior vena cava is normal in size with greater than 50% respiratory variability, suggesting right atrial pressure of 3 mmHg.     Past Medical History:  Diagnosis Date   Abdominal hernia    Anxiety    Depression    Dysphagia    G tube feedings (HCC)    G tube feedings (HCC)    Gastrostomy status (HCC)    GERD (gastroesophageal reflux disease)    Hemiplegia and hemiparesis following cerebral infarction affecting right dominant side (HCC)    Non-verbal learning disorder    Pulmonary embolus (HCC)    Seizure (HCC)    Seizures (HCC)    Wheelchair dependent     Past Surgical History:  Procedure Laterality Date   CRANIOTOMY Left 06/19/2020   Procedure: CRANIOTOMY HEMATOMA EVACUATION SUBDURAL;  Surgeon: Jadene Pierini, MD;  Location: [facility];  Service: Neurosurgery;  Laterality: Left;   ESOPHAGOGASTRODUODENOSCOPY N/A 07/01/2020   Procedure: ESOPHAGOGASTRODUODENOSCOPY (EGD);  Surgeon: Diamantina Monks, MD;  Location: [facility] ENDOSCOPY;  Service: General;  Laterality: N/A;   IR ANGIO INTRA EXTRACRAN SEL INTERNAL CAROTID BILAT MOD SED  06/16/2020   IR ANGIO VERTEBRAL SEL VERTEBRAL UNI R MOD SED  06/16/2020   IR ANGIOGRAM FOLLOW UP STUDY  06/16/2020   IR ANGIOGRAM FOLLOW UP STUDY  06/16/2020   IR ANGIOGRAM FOLLOW UP STUDY  06/16/2020   IR CT HEAD LTD  06/16/2020   IR NEURO EACH ADD'L AFTER BASIC UNI RIGHT (MS)  06/16/2020   IR TRANSCATH/EMBOLIZ  06/16/2020   IR US GUIDE VASC ACCESS RIGHT  06/16/2020   PEG PLACEMENT N/A 07/01/2020  Procedure: PERCUTANEOUS ENDOSCOPIC GASTROSTOMY (PEG) PLACEMENT;  Surgeon: Diamantina Monks, MD;  Location[facility];  Service: General;  Laterality: N/A;   RADIOLOGY WITH ANESTHESIA N/A 06/16/2020   Procedure: IR WITH ANESTHESIA;  Surgeon: Lisbeth Renshaw, MD;  Location: [facility] OR;  Service: Radiology;  Laterality: N/A;   TUBAL LIGATION     VENTRAL HERNIA REPAIR N/A 12/29/2014   Procedure: LAPAROSCOPIC VENTRAL HERNIA WITH MESH;  Surgeon: Franky Macho, MD;  Location: [facility] ORS;   Service: General;  Laterality: N/A;    MEDICATIONS: No current facility-administered medications for this encounter.    acetaminophen (TYLENOL) 325 MG tablet   Amino Acids-Protein Hydrolys (PRO-STAT PO)   ELIQUIS 5 MG TABS tablet   escitalopram (LEXAPRO) 10 MG tablet   lacosamide (VIMPAT) 10 MG/ML oral solution   LORazepam (ATIVAN) 1 MG tablet   Nutritional Supplements (ISOSOURCE 1.5 CAL) LIQD   pantoprazole sodium (PROTONIX) 40 mg PACK   traZODone (DESYREL) 50 MG tablet   valproic acid (DEPAKENE) 250 MG/5ML solution   Water For Irrigation, Sterile (FREE WATER) SOLN   lacosamide 100 MG TABS   levETIRAcetam (KEPPRA) 100 MG/ML solution   potassium chloride (KLOR-CON) 20 MEQ packet    If no changes and pt holds eliquis prior to surgery, I anticipate pt can proceed with surgery as scheduled.   Rica Mast, PhD, FNP-BC [facility]/Anesthesiology Phone: [phone] 03/17/2021 3:27 PM

## 2021-03-17 NOTE — Anesthesia Preprocedure Evaluation (Addendum)
Anesthesia Evaluation  Patient identified by MRN, date of birth, ID band Patient awake  General Assessment Comment:Patient's daughter at bedsde to answer any questions.  Reviewed: Allergy & Precautions, NPO status , Patient's Chart, lab work & pertinent test results  Airway Mallampati: II  TM Distance: >3 FB Neck ROM: Full    Dental  (+) Teeth Intact, Dental Advisory Given   Pulmonary    breath sounds clear to auscultation       Cardiovascular negative cardio ROS Normal cardiovascular exam Rhythm:Regular Rate:Normal     Neuro/Psych Seizures -,     GI/Hepatic Neg liver ROS, GERD  ,  Endo/Other  negative endocrine ROS  Renal/GU negative Renal ROS     Musculoskeletal   Abdominal   Peds  Hematology   Anesthesia Other Findings   Reproductive/Obstetrics                           Anesthesia Physical Anesthesia Plan  ASA: 3  Anesthesia Plan: General   Post-op Pain Management:    Induction:   PONV Risk Score and Plan: 3 and Ondansetron and Dexamethasone  Airway Management Planned: Oral ETT  Additional Equipment:   Intra-op Plan:   Post-operative Plan: Possible Post-op intubation/ventilation  Informed Consent: I have reviewed the patients History and Physical, chart, labs and discussed the procedure including the risks, benefits and alternatives for the proposed anesthesia with the patient or authorized representative who has indicated his/her understanding and acceptance.     Dental advisory given  Plan Discussed with: CRNA and Anesthesiologist  Anesthesia Plan Comments: (See APP note by Joslyn Hy, FNP )      Anesthesia Quick Evaluation

## 2021-03-22 ENCOUNTER — Other Ambulatory Visit: Payer: Self-pay

## 2021-03-22 ENCOUNTER — Encounter (HOSPITAL_COMMUNITY): Payer: Self-pay | Admitting: Neurosurgery

## 2021-03-22 NOTE — Pre-Procedure Instructions (Addendum)
    Denise Savage  03/22/2021     Your procedure is scheduled on Tuesday, October 11.  Report to [facility] Admitting at 7:15 A.M.  Call this number if you have problems the morning of surgery: [phone]- this is the Pre- Surgery Desk  Major Important: >>>>>Please send patient's Medication Record with medications administrated documentation. ( this information is required prior to OR. This includes medications that may have been on hold for surgery)<<<<<    Remember:  Stop tube feeding at midnight tonight.    Take these medicines the morning of surgery through feeding tube in AM: LORazepam (ATIVAN) pantoprazole sodium (PROTONIX)  valproic acid (DEPAKENE)      Flush with 30 ml water. Patient should have a shower, with antibacteria soap, the morning of surgery . Dry off with a clean towel.  Patient should not have lotions, powders, colognes, deodorant, jewelry, or piercing's. Wear clean comfortable clothes.  Brush teeth.    Do not wear jewelry, make-up or nail polish.  Do not wear lotions, powders, or perfumes, or deodorant.  Do not shave 48 hours prior to surgery.    Do not bring valuables to the hospital.  [facility] is not responsible for any belongings or valuables.

## 2021-03-22 NOTE — Progress Notes (Addendum)
I spoke to Meeker , Ms Kasik's nurse at [employer]. Chelesa rerports that patient was given the last dose of ELiquis on 03/17/21. Chelesa reports that Ms Segall is non verbal, no weight bearing, moves extremities.  I faxed  instructions to Chelesa at [facility] , attention Chelesa.  Tube feeding to be stopped at midnight tonight.   I listed the medications Ms Rebuck may have via feeding tube in am ; with a 30 ml flush.  I also included hygiene instructions. I spoke to Jacalyn Lefevre, Ms [facility] Power of attorney, she will be at the hospital by 0830 to sign consent.

## 2021-03-23 ENCOUNTER — Inpatient Hospital Stay (HOSPITAL_COMMUNITY)
Admission: RE | Admit: 2021-03-23 | Discharge: 2021-03-26 | DRG: 026 | Disposition: A | Discharge status: Skilled Nursing Facility | Payer: Medicaid Other | Source: Skilled Nursing Facility | Attending: Neurosurgery | Admitting: Neurosurgery

## 2021-03-23 ENCOUNTER — Inpatient Hospital Stay (HOSPITAL_COMMUNITY): Payer: Medicaid Other | Admitting: Vascular Surgery

## 2021-03-23 ENCOUNTER — Encounter (HOSPITAL_COMMUNITY): Payer: Self-pay | Admitting: Neurosurgery

## 2021-03-23 ENCOUNTER — Encounter (HOSPITAL_COMMUNITY)
Admission: RE | Disposition: A | Discharge status: Skilled Nursing Facility | Payer: Self-pay | Source: Skilled Nursing Facility | Attending: Neurosurgery

## 2021-03-23 ENCOUNTER — Other Ambulatory Visit: Payer: Self-pay

## 2021-03-23 DIAGNOSIS — Z9851 Tubal ligation status: Secondary | ICD-10-CM

## 2021-03-23 DIAGNOSIS — Z885 Allergy status to narcotic agent status: Secondary | ICD-10-CM

## 2021-03-23 DIAGNOSIS — F32A Depression, unspecified: Secondary | ICD-10-CM | POA: Diagnosis present

## 2021-03-23 DIAGNOSIS — Z993 Dependence on wheelchair: Secondary | ICD-10-CM

## 2021-03-23 DIAGNOSIS — Z79899 Other long term (current) drug therapy: Secondary | ICD-10-CM

## 2021-03-23 DIAGNOSIS — Z20822 Contact with and (suspected) exposure to covid-19: Secondary | ICD-10-CM | POA: Diagnosis present

## 2021-03-23 DIAGNOSIS — Z931 Gastrostomy status: Secondary | ICD-10-CM

## 2021-03-23 DIAGNOSIS — Z7901 Long term (current) use of anticoagulants: Secondary | ICD-10-CM | POA: Diagnosis not present

## 2021-03-23 DIAGNOSIS — G9782 Other postprocedural complications and disorders of nervous system: Secondary | ICD-10-CM | POA: Diagnosis present

## 2021-03-23 DIAGNOSIS — Z9889 Other specified postprocedural states: Secondary | ICD-10-CM

## 2021-03-23 DIAGNOSIS — Z86711 Personal history of pulmonary embolism: Secondary | ICD-10-CM | POA: Diagnosis not present

## 2021-03-23 DIAGNOSIS — G40909 Epilepsy, unspecified, not intractable, without status epilepticus: Secondary | ICD-10-CM | POA: Diagnosis present

## 2021-03-23 DIAGNOSIS — K219 Gastro-esophageal reflux disease without esophagitis: Secondary | ICD-10-CM | POA: Diagnosis present

## 2021-03-23 DIAGNOSIS — Y838 Other surgical procedures as the cause of abnormal reaction of the patient, or of later complication, without mention of misadventure at the time of the procedure: Secondary | ICD-10-CM | POA: Diagnosis present

## 2021-03-23 DIAGNOSIS — I69351 Hemiplegia and hemiparesis following cerebral infarction affecting right dominant side: Secondary | ICD-10-CM

## 2021-03-23 DIAGNOSIS — F419 Anxiety disorder, unspecified: Secondary | ICD-10-CM | POA: Diagnosis present

## 2021-03-23 DIAGNOSIS — M952 Other acquired deformity of head: Secondary | ICD-10-CM | POA: Diagnosis present

## 2021-03-23 DIAGNOSIS — F819 Developmental disorder of scholastic skills, unspecified: Secondary | ICD-10-CM | POA: Diagnosis present

## 2021-03-23 DIAGNOSIS — R4701 Aphasia: Secondary | ICD-10-CM | POA: Diagnosis present

## 2021-03-23 HISTORY — DX: Dependence on wheelchair: Z99.3

## 2021-03-23 HISTORY — DX: Other developmental disorders of scholastic skills: F81.89

## 2021-03-23 HISTORY — PX: CRANIOPLASTY: SHX1407

## 2021-03-23 HISTORY — DX: Unspecified convulsions: R56.9

## 2021-03-23 HISTORY — DX: Gastrostomy status: Z93.1

## 2021-03-23 HISTORY — DX: Gastro-esophageal reflux disease without esophagitis: K21.9

## 2021-03-23 HISTORY — DX: Depression, unspecified: F32.A

## 2021-03-23 HISTORY — DX: Anxiety disorder, unspecified: F41.9

## 2021-03-23 LAB — BASIC METABOLIC PANEL
Anion gap: 8 (ref 5–15)
BUN: 18 mg/dL (ref 6–20)
CO2: 26 mmol/L (ref 22–32)
Calcium: 10.8 mg/dL — ABNORMAL HIGH (ref 8.9–10.3)
Chloride: 103 mmol/L (ref 98–111)
Creatinine, Ser: 0.73 mg/dL (ref 0.44–1.00)
GFR, Estimated: >60 mL/min (ref >60)
Glucose, Bld: 95 mg/dL (ref 70–99)
Potassium: 3.9 mmol/L (ref 3.5–5.1)
Sodium: 137 mmol/L (ref 135–145)

## 2021-03-23 LAB — CBC
HCT: 39 % (ref 36.0–46.0)
Hemoglobin: 12 g/dL (ref 12.0–15.0)
MCH: 28.4 pg (ref 26.0–34.0)
MCHC: 30.8 g/dL (ref 30.0–36.0)
MCV: 92.2 fL (ref 80.0–100.0)
Platelets: 251 10*3/uL (ref 150–400)
RBC: 4.23 MIL/uL (ref 3.87–5.11)
RDW: 14.8 % (ref 11.5–15.5)
WBC: 6.4 10*3/uL (ref 4.0–10.5)
nRBC: 0 % (ref 0.0–0.2)

## 2021-03-23 LAB — GLUCOSE, CAPILLARY
Glucose-Capillary: 150 mg/dL — ABNORMAL HIGH (ref 70–99)
Glucose-Capillary: 166 mg/dL — ABNORMAL HIGH (ref 70–99)

## 2021-03-23 LAB — MRSA NEXT GEN BY PCR, NASAL: MRSA by PCR Next Gen: NOT DETECTED

## 2021-03-23 LAB — SARS CORONAVIRUS 2 BY RT PCR (HOSPITAL ORDER, PERFORMED IN ~~LOC~~ HOSPITAL LAB): SARS Coronavirus 2: NEGATIVE

## 2021-03-23 SURGERY — CRANIOPLASTY
Anesthesia: General | Site: Head | Laterality: Left

## 2021-03-23 MED ORDER — BUPIVACAINE HCL (PF) 0.5 % IJ SOLN
INTRAMUSCULAR | Status: AC
  Filled 2021-03-23: qty 30

## 2021-03-23 MED ORDER — ESMOLOL HCL 100 MG/10ML IV SOLN
INTRAVENOUS | Status: AC
  Filled 2021-03-23: qty 10

## 2021-03-23 MED ORDER — ONDANSETRON HCL 4 MG/2ML IJ SOLN
INTRAMUSCULAR | Status: DC | PRN
  Administered 2021-03-23: 4 mg via INTRAVENOUS

## 2021-03-23 MED ORDER — PANTOPRAZOLE SODIUM 40 MG PO PACK: 40.0000 mg | PACK | Freq: Every day | ORAL | Status: DC

## 2021-03-23 MED ORDER — MORPHINE SULFATE (PF) 2 MG/ML IV SOLN
2.0000 mg | INTRAVENOUS | Status: DC | PRN
Start: 2021-03-23 — End: 2021-03-26

## 2021-03-23 MED ORDER — FENTANYL CITRATE (PF) 250 MCG/5ML IJ SOLN
INTRAMUSCULAR | Status: DC | PRN
  Administered 2021-03-23: 150 ug via INTRAVENOUS
  Administered 2021-03-23: 50 ug via INTRAVENOUS

## 2021-03-23 MED ORDER — ESMOLOL HCL 100 MG/10ML IV SOLN
INTRAVENOUS | Status: DC | PRN
  Administered 2021-03-23: 20 mg via INTRAVENOUS

## 2021-03-23 MED ORDER — SUGAMMADEX SODIUM 200 MG/2ML IV SOLN
INTRAVENOUS | Status: DC | PRN
  Administered 2021-03-23: 200 mg via INTRAVENOUS

## 2021-03-23 MED ORDER — CEFAZOLIN SODIUM-DEXTROSE 2-4 GM/100ML-% IV SOLN
2.0000 g | Freq: Three times a day (TID) | INTRAVENOUS | Status: AC
  Administered 2021-03-23 – 2021-03-24 (×2): 2 g via INTRAVENOUS
  Filled 2021-03-23 (×2): qty 100

## 2021-03-23 MED ORDER — PRO-STAT PO LIQD: Freq: Two times a day (BID) | ORAL | Status: DC

## 2021-03-23 MED ORDER — PHENYLEPHRINE 40 MCG/ML (10ML) SYRINGE FOR IV PUSH (FOR BLOOD PRESSURE SUPPORT)
PREFILLED_SYRINGE | INTRAVENOUS | Status: DC | PRN
  Administered 2021-03-23 (×3): 120 ug via INTRAVENOUS

## 2021-03-23 MED ORDER — ISOSOURCE 1.5 CAL PO LIQD: 55.0000 mL/d | Freq: Every day | ORAL | Status: DC

## 2021-03-23 MED ORDER — BACITRACIN ZINC 500 UNIT/GM EX OINT
TOPICAL_OINTMENT | CUTANEOUS | Status: AC
  Filled 2021-03-23: qty 28.35

## 2021-03-23 MED ORDER — TRAZODONE HCL 50 MG PO TABS: 100.0000 mg | ORAL_TABLET | Freq: Every day | ORAL | Status: DC

## 2021-03-23 MED ORDER — PROMETHAZINE HCL 25 MG PO TABS: 12.5000 mg | ORAL_TABLET | ORAL | Status: DC | PRN

## 2021-03-23 MED ORDER — BACITRACIN ZINC 500 UNIT/GM EX OINT
TOPICAL_OINTMENT | CUTANEOUS | Status: DC | PRN
  Administered 2021-03-23: 1 via TOPICAL

## 2021-03-23 MED ORDER — ROCURONIUM BROMIDE 10 MG/ML (PF) SYRINGE
PREFILLED_SYRINGE | INTRAVENOUS | Status: DC | PRN
  Administered 2021-03-23: 70 mg via INTRAVENOUS

## 2021-03-23 MED ORDER — MIDAZOLAM HCL 2 MG/2ML IJ SOLN
INTRAMUSCULAR | Status: AC
  Filled 2021-03-23: qty 2

## 2021-03-23 MED ORDER — JEVITY 1.5 CAL/FIBER PO LIQD
1000.0000 mL | ORAL | Status: DC
  Administered 2021-03-23 – 2021-03-24 (×2): 1000 mL
  Filled 2021-03-23 (×5): qty 1000

## 2021-03-23 MED ORDER — ESCITALOPRAM OXALATE 10 MG PO TABS
10.0000 mg | ORAL_TABLET | Freq: Every morning | ORAL | Status: DC
  Administered 2021-03-24 – 2021-03-26 (×3): 10 mg
  Filled 2021-03-23 (×3): qty 1

## 2021-03-23 MED ORDER — ACETAMINOPHEN 325 MG PO TABS: 650.0000 mg | ORAL_TABLET | ORAL | Status: DC | PRN

## 2021-03-23 MED ORDER — LIDOCAINE-EPINEPHRINE 1 %-1:100000 IJ SOLN
INTRAMUSCULAR | Status: AC
  Filled 2021-03-23: qty 1

## 2021-03-23 MED ORDER — THROMBIN 5000 UNITS EX SOLR
OROMUCOSAL | Status: DC | PRN
  Administered 2021-03-23: 5 mL via TOPICAL

## 2021-03-23 MED ORDER — PROPOFOL 10 MG/ML IV BOLUS
INTRAVENOUS | Status: DC | PRN
  Administered 2021-03-23: 150 mg via INTRAVENOUS

## 2021-03-23 MED ORDER — SODIUM CHLORIDE 0.9 % IV SOLN: INTRAVENOUS | Status: DC

## 2021-03-23 MED ORDER — CHLORHEXIDINE GLUCONATE 0.12 % MT SOLN
15.0000 mL | Freq: Two times a day (BID) | OROMUCOSAL | Status: DC
  Administered 2021-03-23 – 2021-03-26 (×6): 15 mL via OROMUCOSAL
  Filled 2021-03-23 (×2): qty 15

## 2021-03-23 MED ORDER — TRAZODONE HCL 50 MG PO TABS
100.0000 mg | ORAL_TABLET | Freq: Every day | ORAL | Status: DC
  Administered 2021-03-23 – 2021-03-25 (×3): 100 mg
  Filled 2021-03-23 (×3): qty 2

## 2021-03-23 MED ORDER — LACOSAMIDE 10 MG/ML PO SOLN
100.0000 mg | Freq: Two times a day (BID) | ORAL | Status: DC
  Administered 2021-03-23 – 2021-03-26 (×6): 100 mg
  Filled 2021-03-23 (×3): qty 12
  Filled 2021-03-23: qty 10
  Filled 2021-03-23 (×4): qty 12

## 2021-03-23 MED ORDER — LACOSAMIDE 10 MG/ML PO SOLN: 100.0000 mg | Freq: Two times a day (BID) | ORAL | Status: DC

## 2021-03-23 MED ORDER — PHENYLEPHRINE HCL-NACL 20-0.9 MG/250ML-% IV SOLN
INTRAVENOUS | Status: DC | PRN
  Administered 2021-03-23: 10 ug/min via INTRAVENOUS

## 2021-03-23 MED ORDER — HEPARIN SODIUM (PORCINE) 5000 UNIT/ML IJ SOLN
5000.0000 [IU] | Freq: Three times a day (TID) | INTRAMUSCULAR | Status: DC
  Administered 2021-03-25 – 2021-03-26 (×5): 5000 [IU] via SUBCUTANEOUS
  Filled 2021-03-23 (×5): qty 1

## 2021-03-23 MED ORDER — ORAL CARE MOUTH RINSE
15.0000 mL | Freq: Two times a day (BID) | OROMUCOSAL | Status: DC
  Administered 2021-03-23 – 2021-03-26 (×6): 15 mL via OROMUCOSAL

## 2021-03-23 MED ORDER — SODIUM CHLORIDE 0.9 % IV SOLN: INTRAVENOUS | Status: DC | PRN

## 2021-03-23 MED ORDER — ONDANSETRON HCL 4 MG/2ML IJ SOLN
4.0000 mg | INTRAMUSCULAR | Status: DC | PRN
  Administered 2021-03-25: 4 mg via INTRAVENOUS
  Filled 2021-03-23: qty 2

## 2021-03-23 MED ORDER — LORAZEPAM 1 MG PO TABS
1.0000 mg | ORAL_TABLET | Freq: Three times a day (TID) | ORAL | Status: DC
  Administered 2021-03-23 – 2021-03-26 (×10): 1 mg
  Filled 2021-03-23 (×10): qty 1

## 2021-03-23 MED ORDER — PROPOFOL 10 MG/ML IV BOLUS
INTRAVENOUS | Status: AC
  Filled 2021-03-23: qty 20

## 2021-03-23 MED ORDER — LIDOCAINE 2% (20 MG/ML) 5 ML SYRINGE
INTRAMUSCULAR | Status: AC
  Filled 2021-03-23: qty 5

## 2021-03-23 MED ORDER — PROSOURCE TF PO LIQD
30.0000 mL | Freq: Two times a day (BID) | ORAL | Status: DC
  Administered 2021-03-23 – 2021-03-24 (×2): 30 mL
  Filled 2021-03-23 (×2): qty 45

## 2021-03-23 MED ORDER — 0.9 % SODIUM CHLORIDE (POUR BTL) OPTIME
TOPICAL | Status: DC | PRN
  Administered 2021-03-23: 2000 mL

## 2021-03-23 MED ORDER — LIDOCAINE-EPINEPHRINE 1 %-1:100000 IJ SOLN
INTRAMUSCULAR | Status: DC | PRN
  Administered 2021-03-23: 5 mL

## 2021-03-23 MED ORDER — DEXAMETHASONE SODIUM PHOSPHATE 10 MG/ML IJ SOLN
INTRAMUSCULAR | Status: DC | PRN
  Administered 2021-03-23: 10 mg via INTRAVENOUS

## 2021-03-23 MED ORDER — THROMBIN 5000 UNITS EX SOLR
CUTANEOUS | Status: AC
  Filled 2021-03-23: qty 5000

## 2021-03-23 MED ORDER — FENTANYL CITRATE (PF) 100 MCG/2ML IJ SOLN
25.0000 ug | INTRAMUSCULAR | Status: DC | PRN
  Administered 2021-03-23: 25 ug via INTRAVENOUS

## 2021-03-23 MED ORDER — FENTANYL CITRATE (PF) 100 MCG/2ML IJ SOLN
INTRAMUSCULAR | Status: AC
  Filled 2021-03-23: qty 2

## 2021-03-23 MED ORDER — CEFAZOLIN SODIUM-DEXTROSE 2-4 GM/100ML-% IV SOLN
2.0000 g | INTRAVENOUS | Status: AC
  Administered 2021-03-23: 2 g via INTRAVENOUS
  Filled 2021-03-23: qty 100

## 2021-03-23 MED ORDER — ONDANSETRON HCL 4 MG/2ML IJ SOLN: 4.0000 mg | INTRAMUSCULAR | Status: DC | PRN

## 2021-03-23 MED ORDER — ROCURONIUM BROMIDE 10 MG/ML (PF) SYRINGE
PREFILLED_SYRINGE | INTRAVENOUS | Status: AC
  Filled 2021-03-23: qty 10

## 2021-03-23 MED ORDER — FREE WATER: 30.0000 mL | Status: DC | PRN

## 2021-03-23 MED ORDER — CHLORHEXIDINE GLUCONATE 0.12 % MT SOLN
15.0000 mL | Freq: Once | OROMUCOSAL | Status: AC
  Administered 2021-03-23: 15 mL via OROMUCOSAL
  Filled 2021-03-23: qty 15

## 2021-03-23 MED ORDER — BUPIVACAINE HCL (PF) 0.5 % IJ SOLN
INTRAMUSCULAR | Status: DC | PRN
  Administered 2021-03-23: 5 mL

## 2021-03-23 MED ORDER — CHLORHEXIDINE GLUCONATE CLOTH 2 % EX PADS: 6.0000 | MEDICATED_PAD | Freq: Once | CUTANEOUS | Status: DC

## 2021-03-23 MED ORDER — ORAL CARE MOUTH RINSE: 15.0000 mL | Freq: Once | OROMUCOSAL | Status: AC

## 2021-03-23 MED ORDER — ONDANSETRON HCL 4 MG PO TABS: 4.0000 mg | ORAL_TABLET | ORAL | Status: DC | PRN

## 2021-03-23 MED ORDER — FENTANYL CITRATE (PF) 250 MCG/5ML IJ SOLN
INTRAMUSCULAR | Status: AC
  Filled 2021-03-23: qty 5

## 2021-03-23 MED ORDER — PANTOPRAZOLE SODIUM 40 MG IV SOLR
40.0000 mg | Freq: Every day | INTRAVENOUS | Status: DC
  Administered 2021-03-23 – 2021-03-25 (×3): 40 mg via INTRAVENOUS
  Filled 2021-03-23 (×3): qty 40

## 2021-03-23 MED ORDER — ONDANSETRON HCL 4 MG/2ML IJ SOLN
INTRAMUSCULAR | Status: AC
  Filled 2021-03-23: qty 2

## 2021-03-23 MED ORDER — VALPROIC ACID 250 MG/5ML PO SOLN
125.0000 mg | Freq: Two times a day (BID) | ORAL | Status: DC
  Administered 2021-03-23 – 2021-03-26 (×6): 125 mg
  Filled 2021-03-23 (×6): qty 5

## 2021-03-23 MED ORDER — LIDOCAINE 2% (20 MG/ML) 5 ML SYRINGE
INTRAMUSCULAR | Status: DC | PRN
  Administered 2021-03-23: 100 mg via INTRAVENOUS

## 2021-03-23 MED ORDER — CHLORHEXIDINE GLUCONATE CLOTH 2 % EX PADS
6.0000 | MEDICATED_PAD | Freq: Every day | CUTANEOUS | Status: DC
  Administered 2021-03-23 – 2021-03-26 (×3): 6 via TOPICAL

## 2021-03-23 MED ORDER — SUCCINYLCHOLINE CHLORIDE 200 MG/10ML IV SOSY
PREFILLED_SYRINGE | INTRAVENOUS | Status: AC
  Filled 2021-03-23: qty 10

## 2021-03-23 MED ORDER — SUCCINYLCHOLINE CHLORIDE 200 MG/10ML IV SOSY
PREFILLED_SYRINGE | INTRAVENOUS | Status: DC | PRN
  Administered 2021-03-23: 140 mg via INTRAVENOUS

## 2021-03-23 MED ORDER — LABETALOL HCL 5 MG/ML IV SOLN: 10.0000 mg | INTRAVENOUS | Status: DC | PRN

## 2021-03-23 MED ORDER — THROMBIN 20000 UNITS EX SOLR
CUTANEOUS | Status: AC
  Filled 2021-03-23: qty 20000

## 2021-03-23 MED ORDER — ACETAMINOPHEN 650 MG RE SUPP: 650.0000 mg | RECTAL | Status: DC | PRN

## 2021-03-23 MED ORDER — LACTATED RINGERS IV SOLN: INTRAVENOUS | Status: DC

## 2021-03-23 MED ORDER — HEMOSTATIC AGENTS (NO CHARGE) OPTIME
TOPICAL | Status: DC | PRN
  Administered 2021-03-23: 1 via TOPICAL

## 2021-03-23 SURGICAL SUPPLY — 72 items
ADH SKN CLS APL DERMABOND .7 (GAUZE/BANDAGES/DRESSINGS) ×1
APL SKNCLS STERI-STRIP NONHPOA (GAUZE/BANDAGES/DRESSINGS)
BAG COUNTER SPONGE SURGICOUNT (BAG) ×2 IMPLANT
BAG SPNG CNTER NS LX DISP (BAG) ×1
BENZOIN TINCTURE PRP APPL 2/3 (GAUZE/BANDAGES/DRESSINGS) IMPLANT
BLADE CLIPPER SURG (BLADE) ×2 IMPLANT
BNDG CMPR 75X41 PLY ABS (GAUZE/BANDAGES/DRESSINGS) ×1
BNDG GAUZE ELAST 4 BULKY (GAUZE/BANDAGES/DRESSINGS) ×4 IMPLANT
BNDG STRETCH 4X75 NS LF (GAUZE/BANDAGES/DRESSINGS) ×2 IMPLANT
BUR ACORN 6.0 PRECISION (BURR) IMPLANT
BUR MATCHSTICK NEURO 3.0 LAGG (BURR) IMPLANT
BUR SPIRAL ROUTER 2.3 (BUR) IMPLANT
CANISTER SUCT 3000ML PPV (MISCELLANEOUS) ×3 IMPLANT
CARTRIDGE OIL MAESTRO DRILL (MISCELLANEOUS) ×1 IMPLANT
CLIP VESOCCLUDE MED 6/CT (CLIP) IMPLANT
DERMABOND ADVANCED (GAUZE/BANDAGES/DRESSINGS) ×1
DERMABOND ADVANCED .7 DNX12 (GAUZE/BANDAGES/DRESSINGS) ×1 IMPLANT
DIFFUSER DRILL AIR PNEUMATIC (MISCELLANEOUS) IMPLANT
DRAPE NEUROLOGICAL W/INCISE (DRAPES) ×2 IMPLANT
DRAPE SURG 17X23 STRL (DRAPES) IMPLANT
DRAPE WARM FLUID 44X44 (DRAPES) ×2 IMPLANT
DRSG TELFA 3X8 NADH (GAUZE/BANDAGES/DRESSINGS) ×2 IMPLANT
DURAPREP 6ML APPLICATOR 50/CS (WOUND CARE) ×3 IMPLANT
ELECT REM PT RETURN 9FT ADLT (ELECTROSURGICAL) ×2
ELECTRODE REM PT RTRN 9FT ADLT (ELECTROSURGICAL) ×1 IMPLANT
GAUZE 4X4 16PLY ~~LOC~~+RFID DBL (SPONGE) ×3 IMPLANT
GAUZE SPONGE 4X4 12PLY STRL (GAUZE/BANDAGES/DRESSINGS) IMPLANT
GLOVE EXAM NITRILE XL STR (GLOVE) IMPLANT
GLOVE SURG ENC MOIS LTX SZ7 (GLOVE) ×1 IMPLANT
GLOVE SURG LTX SZ7 (GLOVE) ×4 IMPLANT
GLOVE SURG UNDER POLY LF SZ7 (GLOVE) ×2 IMPLANT
GLOVE SURG UNDER POLY LF SZ7.5 (GLOVE) ×3 IMPLANT
GOWN STRL REUS W/ TWL LRG LVL3 (GOWN DISPOSABLE) ×1 IMPLANT
GOWN STRL REUS W/ TWL XL LVL3 (GOWN DISPOSABLE) ×1 IMPLANT
GOWN STRL REUS W/TWL 2XL LVL3 (GOWN DISPOSABLE) ×1 IMPLANT
GOWN STRL REUS W/TWL LRG LVL3 (GOWN DISPOSABLE) ×2
GOWN STRL REUS W/TWL XL LVL3 (GOWN DISPOSABLE) ×2
HEMOSTAT POWDER KIT SURGIFOAM (HEMOSTASIS) ×1 IMPLANT
HEMOSTAT SURGICEL 2X14 (HEMOSTASIS) IMPLANT
KIT BASIN OR (CUSTOM PROCEDURE TRAY) ×2 IMPLANT
KIT TURNOVER KIT B (KITS) ×2 IMPLANT
NEEDLE HYPO 25X1 1.5 SAFETY (NEEDLE) ×2 IMPLANT
NS IRRIG 1000ML POUR BTL (IV SOLUTION) ×3 IMPLANT
OIL CARTRIDGE MAESTRO DRILL (MISCELLANEOUS)
PACK BATTERY CMF DISP FOR DVR (ORTHOPEDIC DISPOSABLE SUPPLIES) ×1 IMPLANT
PACK CRANIOTOMY CUSTOM (CUSTOM PROCEDURE TRAY) ×2 IMPLANT
PAD DRESSING TELFA 3X8 NADH (GAUZE/BANDAGES/DRESSINGS) IMPLANT
PATTIES SURGICAL .5 X.5 (GAUZE/BANDAGES/DRESSINGS) IMPLANT
PATTIES SURGICAL .5 X3 (DISPOSABLE) IMPLANT
PATTIES SURGICAL 1X1 (DISPOSABLE) IMPLANT
PLATE BONE 12 2H TARGET XL (Plate) ×5 IMPLANT
SCREW UNIII AXS SD 1.5X4 (Screw) ×10 IMPLANT
SPONGE NEURO XRAY DETECT 1X3 (DISPOSABLE) IMPLANT
SPONGE SURGIFOAM ABS GEL 100 (HEMOSTASIS) ×2 IMPLANT
SPONGE T-LAP 4X18 ~~LOC~~+RFID (SPONGE) ×1 IMPLANT
STAPLER VISISTAT 35W (STAPLE) ×2 IMPLANT
STOCKINETTE 6  STRL (DRAPES)
STOCKINETTE 6 STRL (DRAPES) ×1 IMPLANT
SUT ETHILON 3 0 FSL (SUTURE) IMPLANT
SUT ETHILON 3 0 PS 1 (SUTURE) IMPLANT
SUT NURALON 4 0 TR CR/8 (SUTURE) ×4 IMPLANT
SUT STEEL 0 (SUTURE)
SUT STEEL 0 18XMFL TIE 17 (SUTURE) IMPLANT
SUT VIC AB 0 CT1 18XCR BRD8 (SUTURE) ×2 IMPLANT
SUT VIC AB 0 CT1 8-18 (SUTURE) ×8
SUT VIC AB 3-0 SH 8-18 (SUTURE) ×2 IMPLANT
TOWEL GREEN STERILE (TOWEL DISPOSABLE) ×2 IMPLANT
TOWEL GREEN STERILE FF (TOWEL DISPOSABLE) ×2 IMPLANT
TRAY FOLEY MTR SLVR 16FR STAT (SET/KITS/TRAYS/PACK) ×1 IMPLANT
TUBE CONNECTING 12X1/4 (SUCTIONS) ×1 IMPLANT
UNDERPAD 30X36 HEAVY ABSORB (UNDERPADS AND DIAPERS) ×2 IMPLANT
WATER STERILE IRR 1000ML POUR (IV SOLUTION) ×4 IMPLANT

## 2021-03-23 NOTE — Progress Notes (Signed)
20 mg Vimpat wasted. Francoise Schaumann, RN witness

## 2021-03-23 NOTE — Anesthesia Postprocedure Evaluation (Signed)
Anesthesia Post Note  Patient: Denise Savage  Procedure(s) Performed: Collier Salina ABDOMINAL BONE FLAP (Left: Head)     Patient location during evaluation: PACU Anesthesia Type: General Level of consciousness: awake Pain management: pain level controlled Vital Signs Assessment: post-procedure vital signs reviewed and stable Respiratory status: spontaneous breathing Cardiovascular status: stable Postop Assessment: no apparent nausea or vomiting Anesthetic complications: no   No notable events documented.  Last Vitals:  Vitals:   03/23/21 1630 03/23/21 1645  BP: 101/77 (!) 122/95  Pulse: 92 90  Resp: 15 14  Temp:    SpO2: 97% 97%    Last Pain:  Vitals:   03/23/21 1453  TempSrc:   PainSc: Asleep                 Chieko Neises

## 2021-03-23 NOTE — Transfer of Care (Signed)
Immediate Anesthesia Transfer of Care Note  Patient: Denise Savage  Procedure(s) Performed: Collier Salina ABDOMINAL BONE FLAP (Left: Head)  Patient Location: PACU  Anesthesia Type:General  Level of Consciousness: unresponsive  Airway & Oxygen Therapy: Patient Spontanous Breathing and Patient connected to face mask oxygen  Post-op Assessment: Report given to RN and Post -op Vital signs reviewed and stable  Post vital signs: Reviewed  Last Vitals:  Vitals Value Taken Time  BP 149/98 03/23/21 1425  Temp    Pulse 91 03/23/21 1434  Resp 16 03/23/21 1434  SpO2 98 % 03/23/21 1434  Vitals shown include unvalidated device data.  Last Pain:  Vitals:   03/23/21 1425  TempSrc:   PainSc: Asleep         Complications: No notable events documented.

## 2021-03-23 NOTE — Anesthesia Procedure Notes (Signed)
Procedure Name: Intubation Date/Time: 03/23/2021 11:55 AM Performed by: Lovie Chol, CRNA Pre-anesthesia Checklist: Patient identified, Emergency Drugs available, Suction available and Patient being monitored Patient Re-evaluated:Patient Re-evaluated prior to induction Oxygen Delivery Method: Circle System Utilized Preoxygenation: Pre-oxygenation with 100% oxygen Induction Type: IV induction Ventilation: Mask ventilation without difficulty Laryngoscope Size: Miller and 3 Grade View: Grade I Tube type: Oral Tube size: 7.0 mm Number of attempts: 1 Airway Equipment and Method: Stylet Placement Confirmation: ETT inserted through vocal cords under direct vision, positive ETCO2 and breath sounds checked- equal and bilateral Secured at: 22 cm Tube secured with: Tape Dental Injury: Teeth and Oropharynx as per pre-operative assessment

## 2021-03-23 NOTE — Op Note (Signed)
  NEUROSURGERY OPERATIVE NOTE   PREOP DIAGNOSIS:  Cranial defect   POSTOP DIAGNOSIS: Same  PROCEDURE: Left cranioplasty Harvest of subcutaneous abdominal bone flap  SURGEON: Dr. Lisbeth Renshaw, MD  ASSISTANT: Dr. Barnett Abu, MD  ANESTHESIA: General Endotracheal  EBL: 100cc  SPECIMENS: None  DRAINS: None  COMPLICATIONS: None immediate  CONDITION: Hemodynamically stable to PACU  HISTORY: Denise Savage is a 60 y.o. female with a history of subarachnoid hemorrhage complicated by intraoperative aneurysm rupture during treatment.  This led to large left MCA territory stroke requiring hemicraniectomy.  Patient has made somewhat of a recovery, is currently cared for at a skilled nursing facility.  She now presents for left cranioplasty.  The risks, benefits, and alternatives to the surgery were all reviewed in detail with the patient's daughters.  After all her questions were answered informed consent was obtained and witnessed.  PROCEDURE IN DETAIL: The patient was brought to the operating room. After induction of general anesthesia, the patient was positioned on the operative table in the supine position. All pressure points were meticulously padded.  Previous left skin incision was then marked out and prepped and draped in the usual sterile fashion, as well as the previous left lower quadrant abdominal incision.  Timeout was conducted, the cranial incision was opened sharply.  The bone edge was then identified and the incision was carried through the galea.  Raney clips were applied.  A plane was then identified between the galea and the underlying dural graft.  This plane was developed until the skin flap was reflected anteriorly.  This did require dissection of the temporalis muscle away from the dura at the inferior margin of the craniectomy.  Once the skin flap was reflected, the dura was dissected away from the bone edges in order to provide some laxity of the underlying dura  and brain.  Hemostasis in the epidural plane was secured with bipolar electrocautery and morselized Gelfoam with thrombin.  At this point the previous abdominal incision was infiltrated with local anesthetic with epinephrine.  The incision was then made sharply and Bovie was used to dissect through the subcutaneous tissue.  The bone flap was then identified and removed.  Hemostasis was then secured.  The bone flap was then plated with standard titanium plates and screws.  Bone flap was then replaced and it did appear that there had been some resorption of the bone edges both around the cranial edges as well as within the abdominal cavity.  Nonetheless, the bone flap did appear to fit reasonably well.  It was secured with titanium screws.  At this point the wound was irrigated with normal saline.  A piece of Gelfoam was placed over the inferior portion of the defect along the squamosal temporal bone.  The temporalis was then reapproximated with interrupted 0 Vicryl stitches.  The galea was reapproximated with interrupted 0 Vicryl stitches and the skin was closed with staples.  The abdominal wound was closed with interrupted 0 and 3-0 Vicryl and a layer of Dermabond was applied.  At the end of the case all sponge, needle, instrument, and cottonoid counts were correct.  A head wrap was then placed.  The patient was then extubated and taken to the postanesthesia care unit in stable hemodynamic condition.   Lisbeth Renshaw, MD [facility]

## 2021-03-23 NOTE — H&P (Signed)
Chief Complaint  Cranial defect  History of Present Illness  Denise Savage is a 60 y.o. 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  Procedure was complicated by intraoperative rupture leading to severe spasm and ultimate complete left MCA territory stroke requiring hemicraniectomy.  She has recovered somewhat, and is cared for at a skilled nursing facility.  She is verbal but not easily comprehensible.  She is nonambulatory.  She presents today for cranioplasty.  She was diagnosed with pulmonary embolus a few months ago and is maintained on Eliquis.  This has been held for the last 6 days.  Past Medical History   Past Medical History:  Diagnosis Date   Abdominal hernia    Anxiety    Depression    Dysphagia    G tube feedings (HCC)    G tube feedings (HCC)    Gastrostomy status (HCC)    GERD (gastroesophageal reflux disease)    Hemiplegia and hemiparesis following cerebral infarction affecting right dominant side (HCC)    Non-verbal learning disorder    Pulmonary embolus (HCC)    Seizure (HCC)    Seizures (HCC)    Wheelchair dependent     Past Surgical History   Past Surgical History:  Procedure Laterality Date   CRANIOTOMY Left 06/19/2020   Procedure: CRANIOTOMY HEMATOMA EVACUATION SUBDURAL;  Surgeon: Jadene Pierini, MD;  Location: [facility] OR;  Service: Neurosurgery;  Laterality: Left;   ESOPHAGOGASTRODUODENOSCOPY N/A 07/01/2020   Procedure: ESOPHAGOGASTRODUODENOSCOPY (EGD);  Surgeon: Diamantina Monks, MD;  Location: [facility] ENDOSCOPY;  Service: General;  Laterality: N/A;   IR ANGIO INTRA EXTRACRAN SEL INTERNAL CAROTID BILAT MOD SED  06/16/2020   IR ANGIO VERTEBRAL SEL VERTEBRAL UNI R MOD SED  06/16/2020   IR ANGIOGRAM FOLLOW UP STUDY  06/16/2020   IR ANGIOGRAM FOLLOW UP STUDY  06/16/2020   IR ANGIOGRAM FOLLOW UP STUDY  06/16/2020   IR CT HEAD LTD  06/16/2020   IR NEURO EACH ADD'L AFTER BASIC UNI RIGHT (MS)   06/16/2020   IR TRANSCATH/EMBOLIZ  06/16/2020   IR US GUIDE VASC ACCESS RIGHT  06/16/2020   PEG PLACEMENT N/A 07/01/2020   Procedure: PERCUTANEOUS ENDOSCOPIC GASTROSTOMY (PEG) PLACEMENT;  Surgeon: Diamantina Monks, MD;  Location[facility] ENDOSCOPY;  Service: General;  Laterality: N/A;   RADIOLOGY WITH ANESTHESIA N/A 06/16/2020   Procedure: IR WITH ANESTHESIA;  Surgeon: Lisbeth Renshaw, MD;  Location: [facility] OR;  Service: Radiology;  Laterality: N/A;   TUBAL LIGATION     VENTRAL HERNIA REPAIR N/A 12/29/2014   Procedure: LAPAROSCOPIC VENTRAL HERNIA WITH MESH;  Surgeon: Franky Macho, MD;  Location: [facility] ORS;  Service: General;  Laterality: N/A;    Social History   Social History   Tobacco Use   Smoking status: Never   Smokeless tobacco: Never  Vaping Use   Vaping Use: Never used  Substance Use Topics   Alcohol use: No   Drug use: No    Medications   Prior to Admission medications   Medication Sig Start Date End Date Taking? Authorizing Provider  acetaminophen (TYLENOL) 325 MG tablet Place 650 mg into feeding tube every 6 (six) hours as needed (mild pain).   Yes [provider]  Amino Acids-Protein Hydrolys (PRO-STAT PO) Take 30 mLs by mouth in the morning and at bedtime. (0900 & 2100)   Yes [provider]  ELIQUIS 5 MG TABS tablet Place 5 mg into feeding tube 2 (two) times  daily. (0900 & 2100) 01/06/21  Yes [provider]  escitalopram (LEXAPRO) 10 MG tablet Place 10 mg into feeding tube in the morning. (0900)   Yes [provider]  lacosamide (VIMPAT) 10 MG/ML oral solution Take 100 mg by mouth 2 (two) times daily. (0900 & 1700)   Yes [provider]  LORazepam (ATIVAN) 1 MG tablet Place 1 mg into feeding tube every 8 (eight) hours. (0000, 0800 & 1600)   Yes [provider]  Nutritional Supplements (ISOSOURCE 1.5 CAL) LIQD Take 55 mL/day by mouth. With Fiber   Yes [provider]  pantoprazole sodium (PROTONIX) 40 mg PACK Place 20 mLs  (40 mg total) into feeding tube daily. 02/01/21 03/17/23 Yes Shahmehdi, Gemma Payor, MD  traZODone (DESYREL) 50 MG tablet Take 100 mg by mouth at bedtime. (2100) 02/21/21  Yes [provider]  valproic acid (DEPAKENE) 250 MG/5ML solution Place 125 mg into feeding tube in the morning and at bedtime.   Yes [provider]  Water For Irrigation, Sterile (FREE WATER) SOLN Place 30 mLs into feeding tube See admin instructions. Flush with 30 cc of water before and after medication administration   Yes [provider]  lacosamide 100 MG TABS Place 1 tablet (100 mg total) into feeding tube 2 (two) times daily. Patient not taking: No sig reported 01/31/21   Kendell Bane, MD  levETIRAcetam (KEPPRA) 100 MG/ML solution Place 10 mLs (1,000 mg total) into feeding tube 2 (two) times daily. Patient not taking: Reported on 03/16/2021 01/31/21 03/02/21  Kendell Bane, MD  potassium chloride (KLOR-CON) 20 MEQ packet Take 20 mEq by mouth daily for 10 days. Patient not taking: Reported on 03/16/2021 02/02/21 02/12/21  Kendell Bane, MD    Allergies   Allergies  Allergen Reactions   Hydrocodone Nausea Only    Review of Systems  ROS  Neurologic Exam  Awake, alert Verbal but incomprehensible Moves left upper extremity and left lower extremity Minimal movement right upper extremity right lower extremity with increased tone Left cranial defect is soft and sunken Left abdominal incision is well-healed with palpable subcutaneous bone flap   Impression  - 60 y.o. female status post subarachnoid hemorrhage and left MCA stroke requiring hemicraniectomy.  Plan  -We will plan on proceeding with left cranioplasty with harvest of abdominal bone flap  I have reviewed the details of the procedure with the patient's daughters.  We have discussed the expected postoperative course and recovery.  We have also discussed the associated risks, benefits, and alternatives to surgery.  All their  questions today were answered.  They provided informed consent on the patient's behalf to proceed.  Lisbeth Renshaw, MD [facility]

## 2021-03-24 ENCOUNTER — Encounter (HOSPITAL_COMMUNITY): Payer: Self-pay | Admitting: Neurosurgery

## 2021-03-24 LAB — GLUCOSE, CAPILLARY
Glucose-Capillary: 128 mg/dL — ABNORMAL HIGH (ref 70–99)
Glucose-Capillary: 141 mg/dL — ABNORMAL HIGH (ref 70–99)
Glucose-Capillary: 123 mg/dL — ABNORMAL HIGH (ref 70–99)
Glucose-Capillary: 113 mg/dL — ABNORMAL HIGH (ref 70–99)
Glucose-Capillary: 128 mg/dL — ABNORMAL HIGH (ref 70–99)
Glucose-Capillary: 108 mg/dL — ABNORMAL HIGH (ref 70–99)

## 2021-03-24 MED ORDER — PROSOURCE TF PO LIQD
30.0000 mL | Freq: Every day | ORAL | Status: DC
  Administered 2021-03-25 – 2021-03-26 (×2): 30 mL
  Filled 2021-03-24 (×2): qty 45

## 2021-03-24 NOTE — TOC Initial Note (Signed)
Transition of Care [facility]) - Initial/Assessment Note    Patient Details  Name: Denise Savage MRN: [id number] Date of Birth: 1960/08/21  Transition of Care [facility]) CM/SW Contact:    Denise Latin, LCSW Phone Number: 03/24/2021, 1:06 PM  Clinical Narrative:                 Patient resides under long term care at [facility]. CSW confirmed that [facility] can accept patient back tomorrow if medically stable for discharge. No authorization required and patient's Covid test from yesterday will still be valid for tomorrow.   Expected Discharge Plan: Skilled Nursing Facility Barriers to Discharge: Continued Medical Work up   Patient Goals and CMS Choice   CMS Medicare.gov Compare Post Acute Care list provided to:: Patient Represenative (must comment) Choice offered to / list presented to : Adult Children  Expected Discharge Plan and Services Expected Discharge Plan: Skilled Nursing Facility In-house Referral: Clinical Social Work   Post Acute Care Choice: Skilled Nursing Facility Living arrangements for the past 2 months: Skilled Nursing Facility                                      Prior Living Arrangements/Services Living arrangements for the past 2 months: Skilled Nursing Facility Lives with:: Facility Resident Patient language and need for interpreter reviewed:: Yes Do you feel safe going back to the place where you live?: Yes      Need for Family Participation in Patient Care: Yes (Comment) Care giver support system in place?: Yes (comment)   Criminal Activity/Legal Involvement Pertinent to Current Situation/Hospitalization: No - Comment as needed  Activities of Daily Living Home Assistive Devices/Equipment: Wheelchair, Eyeglasses ADL Screening (condition at time of admission) Patient's cognitive ability adequate to safely complete daily activities?: Yes Is the patient deaf or have difficulty hearing?: No Does the patient have difficulty seeing, even when wearing  glasses/contacts?: No Does the patient have difficulty concentrating, remembering, or making decisions?: Yes Patient able to express need for assistance with ADLs?: No Does the patient have difficulty dressing or bathing?: Yes Independently performs ADLs?: No Communication: Needs assistance Is this a change from baseline?: Pre-admission baseline Dressing (OT): Needs assistance Is this a change from baseline?: Pre-admission baseline Grooming: Needs assistance Is this a change from baseline?: Pre-admission baseline Feeding:  (feeding tube) Toileting:  (incontinent) Walks in Home:  (wheelchair bound) Does the patient have difficulty walking or climbing stairs?: Yes Weakness of Legs: Both Weakness of Arms/Hands: Both  Permission Sought/Granted Permission sought to share information with : Facility Medical sales representative, Family Supports Permission granted to share information with : Yes, Verbal Permission Granted  Share Information with NAME: Denise Savage  Permission granted to share info w AGENCY: [facility]  Permission granted to share info w Relationship: Daughter  Permission granted to share info w Contact Information: [phone]  Emotional Assessment Appearance:: Appears stated age Attitude/Demeanor/Rapport: Unable to Assess Affect (typically observed): Unable to Assess Orientation: :  (expressive aphasia) Alcohol / Substance Use: Not Applicable Psych Involvement: No (comment)  Admission diagnosis:  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 [place] [address] Phone: [phone] Fax: [phone]     Social Determinants of Health (SDOH) Interventions    Readmission Risk Interventions Readmission Risk Prevention Plan 09/08/2020  Transportation Screening Complete  PCP or Specialist Appt within 5-7 Days Complete  Home Care Screening Complete  Medication Review (RN CM) Complete  Some recent data might be hidden

## 2021-03-24 NOTE — NC FL2 (Signed)
[place] MEDICAID FL2 LEVEL OF CARE SCREENING TOOL     IDENTIFICATION  Patient Name: Denise Savage Birthdate: 1961-04-01 Sex: female Admission Date (Current Location): 03/23/2021  [facility] and [place] Number:  [employer] and Address:  [facility], 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   Acute pancreatitis 01/21/2021   Seizures (HCC)    Pulmonary embolus (HCC)    Dysphagia    Prolonged QT interval    Thickened endometrium    Acute respiratory failure (HCC)    ICH (intracerebral hemorrhage) (HCC) 06/18/2020   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   SAH (subarachnoid hemorrhage) (HCC) 06/15/2020    Orientation RESPIRATION BLADDER Height & Weight      (baseline; expressive aphasia)  Normal Incontinent, External catheter Weight: 203 lb 0.7 oz (92.1 kg) Height:  5\' 7"  (170.2 cm)  BEHAVIORAL SYMPTOMS/MOOD NEUROLOGICAL BOWEL NUTRITION STATUS      Continent Feeding tube (JEVITY 1.5 CAL/FIBER) liquid 1,000 mL)  AMBULATORY STATUS COMMUNICATION OF NEEDS Skin   Extensive Assist Verbally Surgical wounds (Closed incision on head and abdomen)                       Personal Care Assistance Level of Assistance  Bathing, Feeding, Dressing Bathing Assistance: Maximum assistance Feeding assistance: Maximum assistance Dressing Assistance: Maximum assistance     Functional Limitations Info             SPECIAL CARE FACTORS FREQUENCY                        Contractures Contractures Info: Not present    Additional Factors Info  Code Status, Allergies, Psychotropic Code Status Info: Full Allergies Info: Hydrocodone Psychotropic Info: Lexapro;Trazadone;Ativan         Current Medications (03/24/2021):  This is the current hospital active medication list Current Facility-Administered Medications  Medication Dose Route Frequency Provider Last Rate Last Admin   0.9 %  sodium chloride infusion   Intravenous Continuous 05/24/2021, MD 75 mL/hr at 03/24/21 1200 Infusion Verify at 03/24/21 1200   acetaminophen (TYLENOL) tablet 650 mg  650 mg Per Tube Q4H PRN 05/24/21, MD       Or   acetaminophen (TYLENOL) suppository 650 mg  650 mg Rectal Q4H PRN Lisbeth Renshaw, MD       chlorhexidine (PERIDEX) 0.12 % solution 15 mL  15 mL Mouth Rinse BID Lisbeth Renshaw, MD   15 mL at 03/24/21 0904   Chlorhexidine Gluconate Cloth 2 % PADS 6 each  6 each Topical Daily 05/24/21, MD   6 each at 03/24/21 0904   escitalopram (LEXAPRO) tablet 10 mg  10 mg Per Tube q AM 05/24/21, MD   10 mg at 03/24/21 0904   feeding supplement (JEVITY 1.5 CAL/FIBER) liquid 1,000 mL  1,000 mL Per Tube Continuous 05/24/21, MD 55 mL/hr at 03/23/21 1713 1,000 mL at 03/23/21 1713   feeding supplement (PROSource  TF) liquid 30 mL  30 mL Per Tube BID Lisbeth Renshaw, MD   30 mL at 03/24/21 0804   free water 30 mL  30 mL Per Tube PRN Lisbeth Renshaw, MD       Melene Muller ON 03/25/2021] heparin injection 5,000 Units  5,000 Units Subcutaneous Q8H Lisbeth Renshaw, MD       labetalol (NORMODYNE) injection 10-40 mg  10-40 mg Intravenous Q10 min PRN Lisbeth Renshaw, MD       lacosamide (VIMPAT) oral solution 100 mg  100 mg Per Tube BID Lisbeth Renshaw, MD   100 mg at 03/24/21 0804   LORazepam (ATIVAN) tablet 1 mg  1 mg Per Tube Brynda Peon, MD   1 mg at 03/24/21 0805   MEDLINE mouth rinse  15 mL Mouth Rinse q12n4p Lisbeth Renshaw, MD   15 mL at 03/23/21 1724   morphine 2 MG/ML injection 2 mg  2 mg Intravenous Q2H PRN Lisbeth Renshaw, MD       ondansetron (ZOFRAN) tablet 4 mg  4 mg Per Tube Q4H PRN Lisbeth Renshaw, MD       Or   ondansetron (ZOFRAN) injection 4 mg  4 mg Intravenous Q4H PRN Lisbeth Renshaw, MD       pantoprazole (PROTONIX) injection 40 mg  40 mg Intravenous QHS Lisbeth Renshaw, MD   40 mg at 03/23/21 2207   promethazine (PHENERGAN) tablet 12.5-25 mg  12.5-25 mg Per Tube Q4H PRN Lisbeth Renshaw, MD       traZODone (DESYREL) tablet 100 mg  100 mg Per Tube QHS Lisbeth Renshaw, MD   100 mg at 03/23/21 2208   valproic acid (DEPAKENE) 250 MG/5ML solution 125 mg  125 mg Per Tube BID Lisbeth Renshaw, MD   125 mg at 03/24/21 6803     Discharge Medications: Please see discharge summary for a list of discharge medications.  Relevant Imaging Results:  Relevant Lab Results:   Additional Information SSN [id number]  Mearl Latin, LCSW

## 2021-03-24 NOTE — Progress Notes (Signed)
  NEUROSURGERY PROGRESS NOTE   No issues overnight.   EXAM:  BP (!) 138/96 (BP Location: Left Arm)   Pulse (!) 102   Temp 99.4 F (37.4 C) (Axillary)   Resp 20   Ht 5\' 7"  (1.702 m)   Wt 92.1 kg   SpO2 93%   BMI 31.80 kg/m   Sleeping this am but arouses easily Pupils reactive Moving LUE/LLE well Moves RUE, minimal RLE Headwrap in place, some shadowing  IMPRESSION:  60 y.o. female POD#1 left cranioplasty, at baseline  PLAN: - Cont to monitor in ICU - Can likely d/c back to SNF tomorrow   66, MD [facility]

## 2021-03-24 NOTE — Progress Notes (Addendum)
Initial Nutrition Assessment  DOCUMENTATION CODES:   Not applicable  INTERVENTION:   Continue TF via PEG: Jevity 1.5 @ 55 mL/hr (1320 mL/day) 45 mL ProSource Daily 150 mL water flushes q4h  Provides 2020 kcal, 95 gm PRO, 1003 mL of free water (1903 mL total including flushes)  NUTRITION DIAGNOSIS:   Increased nutrient needs related to post-op healing as evidenced by estimated needs.  GOAL:   Patient will meet greater than or equal to 90% of their needs  MONITOR:   Skin, TF tolerance, I & O's, Weight trends  REASON FOR ASSESSMENT:   New TF    ASSESSMENT:   60 y.o. presents for cranioplasty from SNF. PMH includes GERD, dysphagia w/ PEG tube, and chair dependent.   10/11: OR for L cranioplasty  Pt cleared for discharge back to SNF, tentative tomorrow.   Pt with PEG tube, Jevity 1.5 hanging running at 55 mL/hr.  Daughter at bedside and was able to provide nutrition related information. Daughter could not recall TF formula that pt is on at SNF, stated Osmolite sounds familiar. Reports that she does not seem to have any signs of intolerance. Pt noted to be on Osmolite at previous admissions.   Daughter reports that she had a SLP evaluation a few months ago and that she was cleared to swallow.  Reports that pt drank water recently at SNF with no problem. Questioning if pt can have food via PO here. Discussed with RN, RN does not feel pt is able to follow commands and cannot participate in swallow evaluation at this time.   Per daughter, she believes that the pts UBW is about 220# and has not noticed any weight loss. Per EMR, pt has had 6% weight loss in 2 months.   Medications reviewed and include: Heparin, Ativan, Protonix Labs reviewed: 24 hr BG trends 123-166   NUTRITION - FOCUSED PHYSICAL EXAM:  Flowsheet Row Most Recent Value  Orbital Region No depletion  Upper Arm Region No depletion  Thoracic and Lumbar Region No depletion  Buccal Region No depletion  Temple  Region Mild depletion  Clavicle Bone Region No depletion  Clavicle and Acromion Bone Region No depletion  Scapular Bone Region No depletion  Dorsal Hand No depletion  Patellar Region Mild depletion  Anterior Thigh Region Mild depletion  Posterior Calf Region Mild depletion  Edema (RD Assessment) None  Hair Unable to assess  [Surgical Wrap]  Eyes Reviewed  Mouth Unable to assess  [Pt doesn't follow commands]  Skin Reviewed  Nails Reviewed       Diet Order:   Diet Order     None       EDUCATION NEEDS:   No education needs have been identified at this time  Skin:  Skin Assessment: Skin Integrity Issues: Skin Integrity Issues:: Incisions Incisions: Abdomen; Head  Last BM:  03/23/2021  Height:   Ht Readings from Last 1 Encounters:  03/23/21 5\' 7"  (1.702 m)    Weight:   Wt Readings from Last 1 Encounters:  03/23/21 92.1 kg    Ideal Body Weight:  61.4 kg  BMI:  Body mass index is 31.8 kg/m.  Estimated Nutritional Needs:   Kcal:  1800-2000  Protein:  90-105 grams  Fluid:  >/= 1.8 L    Cederick Broadnax BS, PLDN Clinical Dietitian See AMiON for contact information.

## 2021-03-25 LAB — GLUCOSE, CAPILLARY
Glucose-Capillary: 107 mg/dL — ABNORMAL HIGH (ref 70–99)
Glucose-Capillary: 120 mg/dL — ABNORMAL HIGH (ref 70–99)
Glucose-Capillary: 106 mg/dL — ABNORMAL HIGH (ref 70–99)
Glucose-Capillary: 106 mg/dL — ABNORMAL HIGH (ref 70–99)
Glucose-Capillary: 114 mg/dL — ABNORMAL HIGH (ref 70–99)
Glucose-Capillary: 116 mg/dL — ABNORMAL HIGH (ref 70–99)

## 2021-03-25 MED ORDER — JEVITY 1.2 CAL PO LIQD
1000.0000 mL | ORAL | Status: DC
  Filled 2021-03-25: qty 1000

## 2021-03-25 MED ORDER — JEVITY 1.5 CAL/FIBER PO LIQD
1000.0000 mL | ORAL | Status: DC
  Administered 2021-03-25: 1000 mL
  Filled 2021-03-25 (×2): qty 1000

## 2021-03-25 NOTE — Progress Notes (Signed)
  NEUROSURGERY PROGRESS NOTE   No issues overnight.   EXAM:  BP 128/80   Pulse 92   Temp 100.3 F (37.9 C) (Axillary)   Resp 20   Ht 5\' 7"  (1.702 m)   Wt 92.1 kg   SpO2 95%   BMI 31.80 kg/m   Awake, alert, Vocalizes but largely incomprehensible CN grossly intact  Good strength LUE/LLE Antigravity RUE, min movement RLE Wound c/d/I  IMPRESSION:  60 y.o. female POD#2 s/p cranioplasty, at baseline  PLAN: - transfer to stepdown today - plan on transfer back to SNF tomorrow   66, MD [facility]

## 2021-03-26 LAB — GLUCOSE, CAPILLARY
Glucose-Capillary: 122 mg/dL — ABNORMAL HIGH (ref 70–99)
Glucose-Capillary: 110 mg/dL — ABNORMAL HIGH (ref 70–99)
Glucose-Capillary: 99 mg/dL (ref 70–99)

## 2021-03-26 MED ORDER — LACOSAMIDE 10 MG/ML PO SOLN: 100.0000 mg | Freq: Two times a day (BID) | ORAL | 0 refills | Status: DC

## 2021-03-26 MED ORDER — LORAZEPAM 1 MG PO TABS
1.0000 mg | ORAL_TABLET | Freq: Three times a day (TID) | ORAL | 0 refills | Status: DC | PRN
Start: 2021-03-26 — End: 2023-02-03

## 2021-03-26 MED ORDER — ELIQUIS 5 MG PO TABS: 5.0000 mg | ORAL_TABLET | Freq: Two times a day (BID) | ORAL | Status: DC

## 2021-03-26 NOTE — Discharge Summary (Signed)
Physician Discharge Summary  Patient ID: Denise Savage MRN: [id number] DOB/AGE: 1960-11-29 60 y.o.  Admit date: 03/23/2021 Discharge date: 03/26/2021  Admission Diagnoses:  Cranial defect s/p craniectomy  Discharge Diagnoses:  Same Active Problems:   Skull defect   Status post craniectomy   Discharged Condition: Stable  Hospital Course:  Denise Savage is a 60 y.o. female electively admitted after left craniplasty. She was at baseline postop. She was monitored in the ICU without change. She was therefore discharged to her nursing facility in stable condition.  Treatments: Surgery - left cranioplasty  Discharge Exam: Blood pressure 131/77, pulse 85, temperature 99.3 F (37.4 C), temperature source Axillary, resp. rate 16, height 5\' 7"  (1.702 m), weight 92.1 kg, SpO2 93 %. Awake, alert Verbalizes but largely incomprehensible CN grossly intact 5/5 LUE/LLE Moves RUE well, minimal LLE Wound c/d/i  Disposition: Discharge disposition: 03-Skilled Nursing Facility       Discharge Instructions     Call MD for:  redness, tenderness, or signs of infection (pain, swelling, redness, odor or green/yellow discharge around incision site)   Complete by: As directed    Call MD for:  temperature >100.4   Complete by: As directed    Discharge instructions   Complete by: As directed    Walk at home as much as possible, at least 4 times / day   Increase activity slowly   Complete by: As directed    May shower / Bathe   Complete by: As directed    48 hours after surgery   No dressing needed   Complete by: As directed       Allergies as of 03/26/2021       Reactions   Hydrocodone Nausea Only        Medication List     TAKE these medications    acetaminophen 325 MG tablet Commonly known as: TYLENOL Place 650 mg into feeding tube every 6 (six) hours as needed (mild pain).   Eliquis 5 MG Tabs tablet Generic drug: apixaban Place 1 tablet (5 mg total) into feeding  tube 2 (two) times daily. (0900 & 2100) Start taking on: March 28, 2021 What changed: These instructions start on March 28, 2021. If you are unsure what to do until then, ask your doctor or other care provider.   escitalopram 10 MG tablet Commonly known as: LEXAPRO Place 10 mg into feeding tube in the morning. (0900)   free water Soln Place 30 mLs into feeding tube See admin instructions. Flush with 30 cc of water before and after medication administration   Isosource 1.5 Cal Liqd Take 55 mL/day by mouth. With Fiber   lacosamide 10 MG/ML oral solution Commonly known as: VIMPAT Take 100 mg by mouth 2 (two) times daily. (0900 & 1700)   Lacosamide 100 MG Tabs Place 1 tablet (100 mg total) into feeding tube 2 (two) times daily.   levETIRAcetam 100 MG/ML solution Commonly known as: KEPPRA Place 10 mLs (1,000 mg total) into feeding tube 2 (two) times daily.   LORazepam 1 MG tablet Commonly known as: ATIVAN Place 1 mg into feeding tube every 8 (eight) hours. (0000, 0800 & 1600)   pantoprazole sodium 40 mg Commonly known as: PROTONIX Place 20 mLs (40 mg total) into feeding tube daily.   potassium chloride 20 MEQ packet Commonly known as: KLOR-CON Take 20 mEq by mouth daily for 10 days.   PRO-STAT PO Take 30 mLs by mouth in the morning and at bedtime. (0900 &  2100)   traZODone 50 MG tablet Commonly known as: DESYREL Take 100 mg by mouth at bedtime. (2100)   valproic acid 250 MG/5ML solution Commonly known as: DEPAKENE Place 125 mg into feeding tube in the morning and at bedtime.               Discharge Care Instructions  (From admission, onward)           Start     Ordered   03/26/21 0000  No dressing needed        03/26/21 7096            Follow-up Information     Lisbeth Renshaw, MD Follow up in 2 week(s).   Specialty: Neurosurgery Why: For wound re-check Contact information: [address] [place]  [address] [phone]                 Signed: Jackelyn Hoehn 03/26/2021, 7:43 AM

## 2021-03-26 NOTE — TOC Transition Note (Signed)
Transition of Care [facility]) - CM/SW Discharge Note   Patient Details  Name: Denise Savage MRN: [id number] Date of Birth: 27-Nov-1960  Transition of [facility]) CM/SW Contact:  Mearl Latin, LCSW Phone Number: 03/26/2021, 11:56 AM   Clinical Narrative:    [facility] will DC to: [facility] Anticipated DC date: 03/26/21 Family notified: Daughter, Runner, broadcasting/film/video by: Hubert Azure 2:30pm  [facility] staff coming at 12:30pm to pick up their wheelchair from hospital.  Per MD patient ready for DC to [facility]. RN to call report prior to discharge [phone], B5 bed 1 ). RN, patient, patient's family, and facility notified of DC. Discharge Summary and FL2 sent to facility. DC packet on chart. Ambulance transport requested for patient.   CSW will sign off for now as social work intervention is no longer needed. Please consult [place] again if new needs arise.     Final next level of care: Skilled Nursing Facility Barriers to Discharge: Barriers Resolved   Patient Goals and CMS Choice Patient states their goals for this hospitalization and ongoing recovery are:: Return to snf CMS Medicare.gov Compare Post Acute Care list provided to:: Patient Represenative (must comment) Choice offered to / list presented to : Adult Children  Discharge Placement   Existing PASRR number confirmed : 03/26/21          Patient chooses bed at: [facility] Patient to be transferred to facility by: [facility] Name of family member notified: Daughter [name] Patient and family notified of of transfer: 03/26/21  Discharge Plan and Services In-house Referral: Clinical Social Work   Post Acute Care Choice: Skilled Nursing Facility                               Social Determinants of Health (SDOH) Interventions     Readmission Risk Interventions Readmission Risk Prevention Plan 09/08/2020  Transportation Screening Complete  PCP or Specialist Appt within 5-7 Days Complete  Home Care Screening  Complete  Medication Review (RN CM) Complete  Some recent data might be hidden

## 2021-03-26 NOTE — TOC Progression Note (Addendum)
Transition of Care [facility]) - Progression Note    Patient Details  Name: GERALDINE SANDBERG MRN: [id number] Date of Birth: Jul 01, 1960  Transition of Care [facility]) CM/SW Contact  Mearl Latin, LCSW Phone Number: 03/26/2021, 9:10 AM  Clinical Narrative:    [facility] ready to receive patient. RN assisting to obtain signed Ativan script for SNF. CSW updated patient's daughter, [name].   Update: Scripts for Ativan and Vimpat can be sent to [facility] ([address] [place], [place] [address]).   Expected Discharge Plan: Skilled Nursing Facility Barriers to Discharge: Barriers Resolved  Expected Discharge Plan and Services Expected Discharge Plan: Skilled Nursing Facility In-house Referral: Clinical Social Work   Post Acute Care Choice: Skilled Nursing Facility Living arrangements for the past 2 months: Skilled Nursing Facility Expected Discharge Date: 03/26/21                                     Social Determinants of Health (SDOH) Interventions    Readmission Risk Interventions Readmission Risk Prevention Plan 09/08/2020  Transportation Screening Complete  PCP or Specialist Appt within 5-7 Days Complete  Home Care Screening Complete  Medication Review (RN CM) Complete  Some recent data might be hidden

## 2021-03-26 NOTE — Plan of Care (Addendum)
Patiet's surgical site clean dry and intact, vital signs stable and physician has written instructions to discharge back to [facility].   Report was given to Annasha at receiving facility. A [facility] representative collected her wheel chair and belongings prior to transport arriving. I also called and updated her daughter/legal guardian Lorean Ekstrand) of patient's transport.

## 2021-10-20 ENCOUNTER — Ambulatory Visit (INDEPENDENT_AMBULATORY_CARE_PROVIDER_SITE_OTHER): Payer: Medicaid Other | Admitting: Podiatry

## 2021-10-20 ENCOUNTER — Encounter: Payer: Self-pay | Admitting: Podiatry

## 2021-10-20 DIAGNOSIS — B351 Tinea unguium: Secondary | ICD-10-CM

## 2021-10-20 DIAGNOSIS — M79675 Pain in left toe(s): Secondary | ICD-10-CM

## 2021-10-20 DIAGNOSIS — M79674 Pain in right toe(s): Secondary | ICD-10-CM

## 2021-10-21 NOTE — Progress Notes (Signed)
Subjective:  ? ?Patient ID: Denise Savage, female   DOB: 61 y.o.   MRN: [id number]  ? ?HPI ?Patient who comes with 2 caregivers who has had severe nail disease of both feet that have not been taken care of in a long time and is very poor health with difficulty of any form of communication and is very combative.  Patient does not smoke is in wheelchair ? ? ?Review of Systems  ?All other systems reviewed and are negative. ? ? ?   ?Objective:  ?Physical Exam ?Vitals and nursing note reviewed.  ?Constitutional:   ?   Appearance: She is well-developed.  ?Pulmonary:  ?   Effort: Pulmonary effort is normal.  ?Musculoskeletal:     ?   General: Normal range of motion.  ?Skin: ?   General: Skin is warm.  ?Neurological:  ?   Mental Status: She is alert.  ?  ?Vascular status moderately diminished with patient found to have no muscle strength no range of motion with severe elongation of nailbeds 1-5 both feet that are painful and she cries every time I get near them.  Patient no ability to communicate did hit digital perfusion adequate and again no mental status that I was able to communicate with except for caregivers ? ?   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 ?   ?Plan:  ?NP discussed nail care and aggressive debridement technique 1-5 both feet accomplished no iatrogenic bleeding reappoint routine care ?   ? ? ?

## 2022-01-16 ENCOUNTER — Emergency Department (HOSPITAL_COMMUNITY)
Admission: EM | Admit: 2022-01-16 | Discharge: 2022-01-16 | Disposition: A | Discharge status: Skilled Nursing Facility | Payer: Medicaid Other | Attending: Emergency Medicine | Admitting: Emergency Medicine

## 2022-01-16 ENCOUNTER — Other Ambulatory Visit: Payer: Self-pay

## 2022-01-16 ENCOUNTER — Encounter (HOSPITAL_COMMUNITY): Payer: Self-pay | Admitting: *Deleted

## 2022-01-16 DIAGNOSIS — T85598A Other mechanical complication of other gastrointestinal prosthetic devices, implants and grafts, initial encounter: Secondary | ICD-10-CM

## 2022-01-16 DIAGNOSIS — Z7901 Long term (current) use of anticoagulants: Secondary | ICD-10-CM | POA: Insufficient documentation

## 2022-01-16 DIAGNOSIS — K9423 Gastrostomy malfunction: Secondary | ICD-10-CM | POA: Insufficient documentation

## 2022-01-16 NOTE — ED Notes (Signed)
[facility] called to setup transportation back to [facility] at this time.

## 2022-01-16 NOTE — ED Notes (Signed)
Distal end of pts PEG tube wrapped with tape to contain small fluid leak that occurs when flushing or bending tube. No leak present with tape in place.

## 2022-01-16 NOTE — ED Provider Notes (Signed)
[facility] EMERGENCY DEPARTMENT Provider Note   CSN: [id number] Arrival date & time: 01/16/22  1455     History  Chief Complaint  Patient presents with   leaking feeding tube    Denise Savage is a 61 y.o. female.  Patient is a 61 year old female who presents with a leaking PEG tube.  On chart review, she has a history of a ruptured cerebral aneurysm with hemiparesis, dysphagia and aphasia.  She has PEG tube in place that was placed in January 2022.  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  Per EMS report, nursing home reports that patient's PEG tube has been leaking for the last couple of days.  No further information is obtained.       Home Medications Prior to Admission medications   Medication Sig Start Date End Date Taking? Authorizing Provider  acetaminophen (TYLENOL) 325 MG tablet Place 650 mg into feeding tube every 6 (six) hours as needed (mild pain).    [provider]  Amino Acids-Protein Hydrolys (PRO-STAT PO) Take 30 mLs by mouth in the morning and at bedtime. (0900 & 2100)    [provider]  ELIQUIS 5 MG TABS tablet Place 1 tablet (5 mg total) into feeding tube 2 (two) times daily. (0900 & 2100) 03/28/21   Lisbeth Renshaw, MD  escitalopram (LEXAPRO) 10 MG tablet Place 10 mg into feeding tube in the morning. (0900)    [provider]  lacosamide (VIMPAT) 10 MG/ML oral solution Take 10 mLs (100 mg total) by mouth 2 (two) times daily. (0900 & 1700) 03/26/21   Lisbeth Renshaw, MD  lacosamide 100 MG TABS Place 1 tablet (100 mg total) into feeding tube 2 (two) times daily. 01/31/21   Shahmehdi, Gemma Payor, MD  levETIRAcetam (KEPPRA) 100 MG/ML solution Place 10 mLs (1,000 mg total) into feeding tube 2 (two) times daily. 01/31/21 03/02/21  Shahmehdi, Gemma Payor, MD  LORazepam (ATIVAN) 1 MG tablet Place 1 tablet (1 mg total) into feeding tube every 8 (eight) hours as needed for anxiety. (0000, 0800 & 1600) 03/26/21   Lisbeth Renshaw, MD  Nutritional  Supplements (ISOSOURCE 1.5 CAL) LIQD Take 55 mL/day by mouth. With Fiber    [provider]  pantoprazole sodium (PROTONIX) 40 mg PACK Place 20 mLs (40 mg total) into feeding tube daily. 02/01/21 03/17/23  Shahmehdi, Gemma Payor, MD  potassium chloride (KLOR-CON) 20 MEQ packet Take 20 mEq by mouth daily for 10 days. 02/02/21 02/12/21  ShahmehdiGemma Payor, MD  traZODone (DESYREL) 50 MG tablet Take 100 mg by mouth at bedtime. (2100) 02/21/21   [provider]  valproic acid (DEPAKENE) 250 MG/5ML solution Place 125 mg into feeding tube in the morning and at bedtime.    [provider]  Water For Irrigation, Sterile (FREE WATER) SOLN Place 30 mLs into feeding tube See admin instructions. Flush with 30 cc of water before and after medication administration    [provider]      Allergies    Hydrocodone    Review of Systems   Review of Systems  Unable to perform ROS: Patient nonverbal    Physical Exam Updated Vital Signs BP (!) 116/99 (BP Location: Left Arm)   Pulse 74   Temp 98.5 F (36.9 C) (Axillary)   Resp 20   SpO2 95%  Physical Exam Constitutional:      Appearance: She is well-developed.  HENT:     Head: Normocephalic and atraumatic.  Eyes:  Pupils: Pupils are equal, round, and reactive to light.  Cardiovascular:     Rate and Rhythm: Normal rate and regular rhythm.     Heart sounds: Normal heart sounds.  Pulmonary:     Effort: Pulmonary effort is normal. No respiratory distress.     Breath sounds: Normal breath sounds. No wheezing or rales.  Chest:     Chest wall: No tenderness.  Abdominal:     General: Bowel sounds are normal.     Palpations: Abdomen is soft.     Tenderness: There is no abdominal tenderness. There is no guarding or rebound.     Comments: Patient has a PEG tube in place.  There is no inflammation around the tube.  No leakage is visible.  Musculoskeletal:        General: Normal range of motion.     Cervical back: Normal range  of motion and neck supple.  Lymphadenopathy:     Cervical: No cervical adenopathy.  Skin:    General: Skin is warm and dry.     Findings: No rash.  Neurological:     Mental Status: She is alert.     Comments: Patient is awake and alert, will attempt to answer questions but appears to be mostly nonverbal     ED Results / Procedures / Treatments   Labs (all labs ordered are listed, but only abnormal results are displayed) Labs Reviewed - No data to display  EKG None  Radiology No results found.  Procedures Procedures    Medications Ordered in ED Medications - No data to display  ED Course/ Medical Decision Making/ A&P                           Medical Decision Making  Patient is a 61 year old female who presents with a leaking PEG tube.  On chart review, it was placed January 2022.  It does not appear to have a balloon port.  It likely has a Oncologist button.  We were able to successfully push water through the tube.  There is a pinhole leak at the distal end of the tube near the.  It appears to be otherwise working successfully.  Unfortunately, we do not have replacement G-tubes.  I would potentially be able to place a Foley catheter however the tube appears to be working without difficulty.  Tape was placed over the pinhole leak.  I did not feel that we should remove it given that it still working.  However will need to be ultimately replaced with a new G-tube.  I attempted twice to contact the nursing facility to relay this information.  This was unsuccessful.  I did place it in the discharge instructions and I also relayed this information to the family member who is at bedside.  She reports that she is the patient's aunt.  Return precautions were given.  Final Clinical Impression(s) / ED Diagnoses Final diagnoses:  Feeding tube dysfunction, initial encounter    Rx / DC Orders ED Discharge Orders     None         Rolan Bucco, MD 01/16/22 1623

## 2022-01-16 NOTE — Discharge Instructions (Signed)
YOU WILL NEED TO SCHEDULE THE PATIENT TO HAVE THE FEEDING TUBE REPLACED, LIKELY IN INTERVENTIONAL RADIOLOGY.

## 2022-01-16 NOTE — ED Triage Notes (Signed)
Reported pt with leaking feeding tube, ongoing at least since Friday.

## 2022-01-20 ENCOUNTER — Other Ambulatory Visit (HOSPITAL_COMMUNITY): Payer: Self-pay | Admitting: Nurse Practitioner

## 2022-01-20 DIAGNOSIS — Z431 Encounter for attention to gastrostomy: Secondary | ICD-10-CM

## 2022-01-24 ENCOUNTER — Ambulatory Visit (HOSPITAL_COMMUNITY)
Admission: RE | Admit: 2022-01-24 | Discharge: 2022-01-24 | Disposition: A | Discharge status: Home / Self Care | Payer: Medicaid Other | Source: Ambulatory Visit | Attending: Nurse Practitioner | Admitting: Nurse Practitioner

## 2022-01-24 ENCOUNTER — Other Ambulatory Visit (HOSPITAL_COMMUNITY): Payer: Self-pay | Admitting: Nurse Practitioner

## 2022-01-24 ENCOUNTER — Encounter (HOSPITAL_COMMUNITY): Payer: Self-pay

## 2022-01-24 DIAGNOSIS — Z431 Encounter for attention to gastrostomy: Secondary | ICD-10-CM

## 2022-01-24 HISTORY — PX: IR RADIOLOGIST EVAL & MGMT: IMG5224

## 2022-01-24 NOTE — Progress Notes (Signed)
Pt on schedule for Gastrostomy tube exchange due to leakage/malfunction. Chart reviewed, pull through PEG placed by surgical services  07/01/2020  Tube inspected, noted to have tape around end of tubing/hub. Tape removed to reveal hole in the tube itself, source of leak. Tube flushes easily into the stomach and skin site is clean. No other sources of leak seen.  Tube cut below the hole and new hub/cap inserted. Tube may used as before.  Brayton El PA-C Interventional Radiology 01/24/2022 2:58 PM

## 2022-01-27 ENCOUNTER — Encounter (HOSPITAL_COMMUNITY): Payer: Self-pay

## 2022-01-27 HISTORY — PX: GASTROSTOMY: SHX5249

## 2022-01-27 NOTE — Procedures (Signed)
Tube inspected, noted to have tape around end of tubing/hub. Tape removed to reveal hole in the tube itself, source of leak.   Tube flushes easily into the stomach and skin site is clean.   No other sources of leak seen.   Tube cut below the hole and new hub/cap inserted.   Tube may used as before.   Signed,   Brayton El PA-C

## 2022-03-02 ENCOUNTER — Encounter: Payer: Self-pay | Admitting: Obstetrics & Gynecology

## 2022-03-02 ENCOUNTER — Ambulatory Visit (INDEPENDENT_AMBULATORY_CARE_PROVIDER_SITE_OTHER): Payer: Medicaid Other | Admitting: Obstetrics & Gynecology

## 2022-03-02 DIAGNOSIS — I609 Nontraumatic subarachnoid hemorrhage, unspecified: Secondary | ICD-10-CM | POA: Diagnosis not present

## 2022-03-02 DIAGNOSIS — R4701 Aphasia: Secondary | ICD-10-CM | POA: Diagnosis not present

## 2022-03-02 DIAGNOSIS — N950 Postmenopausal bleeding: Secondary | ICD-10-CM

## 2022-03-02 DIAGNOSIS — I69359 Hemiplegia and hemiparesis following cerebral infarction affecting unspecified side: Secondary | ICD-10-CM

## 2022-03-02 DIAGNOSIS — R9389 Abnormal findings on diagnostic imaging of other specified body structures: Secondary | ICD-10-CM | POA: Diagnosis not present

## 2022-03-02 NOTE — Progress Notes (Addendum)
   GYN VISIT Patient name: Denise Savage MRN [id number]  Date of birth: 1961/01/21 Chief Complaint:   Vaginal Bleeding  History of Present Illness:   BLUE RUGGERIO is a 61 y.o. [phone] female with significant medical history for ruptured cerebral aneurysm with hemiparesis, dysphagia and aphasia currently on Eliquis  Pt is unable to provide any medical information and the aid who is with her is also not aware of her current concern.    Called daughterLorrin Goodell- who is POA.  Her daughter notes that women in their family had their periods late in life well into their 41s.  She was not able to elaborate on the bleeding, but rather when they were notified of this issue she did not think much of it.  -Recent US 02/09/2022: 12.5cm uterus with thickened lining 59mm.  No LMP recorded. (Menstrual status: Other).      No data to display          Review of Systems:   Unable to complete Pertinent History Reviewed:  Reviewed past medical,surgical, social, obstetrical and family history.  Reviewed problem list, medications and allergies. Physical Assessment:  There were no vitals filed for this visit.There is no height or weight on file to calculate BMI.       Physical Examination:   General appearance: pt in wheelchair, drowsy, non-verbal  Skin: warm & dry   Cardiovascular: normal heart rate noted  Respiratory: normal respiratory effort, no distress  Abdomen: soft, non-tender, no rebound, no guarding  Pelvic: unable to complete  Extremities: no edema   Chaperone:  Aid from nursing home present     Assessment & Plan:  1) Postmenopausal bleeding -With daughter on the phone reviewed that bleeding at 61yo is not typical -based on [place] thickened lining noted and would recommend further intervention if treatment was wanted.  Discussed that the point of the biopsy would be to r/o endometrial cancer.  Should cancer be noted the plan would be to proceed with surgical intervention.  The daughter  voiced understanding and stated they would want to proceed with that procedure if indicated -Due to the patient's immobility we are not able to complete an in-office EMB and would recommend outpatient [place], D&C -plan to coordinate care with PCP regarding ability to undergo procedure -Questions/concerns were addressed, will plan to schedule in [place], DO Attending [place], [place] for [facility] [place]

## 2022-03-03 ENCOUNTER — Encounter: Payer: Self-pay | Admitting: Obstetrics & Gynecology

## 2022-04-11 ENCOUNTER — Encounter: Payer: Self-pay | Admitting: Obstetrics & Gynecology

## 2022-04-11 NOTE — Progress Notes (Signed)
         This patient is scheduled for a Hysteroscopy, Dilation and curettage on 04/19/2022.  We are requesting that you review her medical history, medications and confirm that she is able to undergo the above procedure.   Please complete the information below.      Is this patient cleared for surgery?               YES           NO   If not, what needs to be completed prior to surgery:   ________________________________________________________________________________________________________________________________________________________________________________________________________________________________________________________________________________________________________________________________________________________________________   Any preop or postop recommendations:   ________________________________________________________________________________________________________________________________________________________________________________________________________________________________________________________________________________________________________________________________________________________________________   Print Name:    _____________________________________________   Signature:       _______________________________________________   Date:  _____________________________________   -Please fax form to our office [facility]: [phone]   -If you have any questions or concerns you may contact Dr. Otis Peak directly: [phone]

## 2022-04-15 ENCOUNTER — Encounter (HOSPITAL_COMMUNITY)
Admission: RE | Admit: 2022-04-15 | Discharge: 2022-04-15 | Disposition: A | Discharge status: Home / Self Care | Payer: Medicaid Other | Source: Ambulatory Visit | Attending: Obstetrics & Gynecology | Admitting: Obstetrics & Gynecology

## 2022-04-15 NOTE — Pre-Procedure Instructions (Signed)
Pt's PCP notified Dr. Nelda Marseille with recommendation for presurgical EKG, chest xray and cardiac clearance. Dr. Nelda Marseille suggested thay the case may need to be postponed to have tests done and clearance from cardiologist.Will notify cardiologist ASAP.

## 2022-04-19 ENCOUNTER — Ambulatory Visit (HOSPITAL_COMMUNITY)
Admission: RE | Admit: 2022-04-19 | Payer: Medicaid Other | Source: Home / Self Care | Admitting: Obstetrics & Gynecology

## 2022-04-19 ENCOUNTER — Encounter (HOSPITAL_COMMUNITY): Admission: RE | Payer: Self-pay | Source: Home / Self Care

## 2022-04-19 DIAGNOSIS — N950 Postmenopausal bleeding: Secondary | ICD-10-CM

## 2022-04-19 DIAGNOSIS — R9389 Abnormal findings on diagnostic imaging of other specified body structures: Secondary | ICD-10-CM

## 2022-04-19 SURGERY — DILATATION AND CURETTAGE /HYSTEROSCOPY
Anesthesia: General

## 2022-04-20 ENCOUNTER — Other Ambulatory Visit: Payer: Self-pay | Admitting: Obstetrics & Gynecology

## 2022-05-25 IMAGING — CT CT HEAD W/O CM
3 series · 15 of 47 positions shown, 18 images · non-contrast
Comparison: Earlier same day

CLINICAL DATA: Aneurysmal hemorrhage post coiling, left MCA infarct
follow-up

EXAM:
CT HEAD WITHOUT CONTRAST
TECHNIQUE: Contiguous axial images were obtained from the base of the skull
through the vertex without intravenous contrast.

[Series 3: head 5.0 h30s · axial · 0.42mm/px · z∈[-356,-231]mm · 9 of 30 slices shown, 12 images]
[im 3/30  brain]
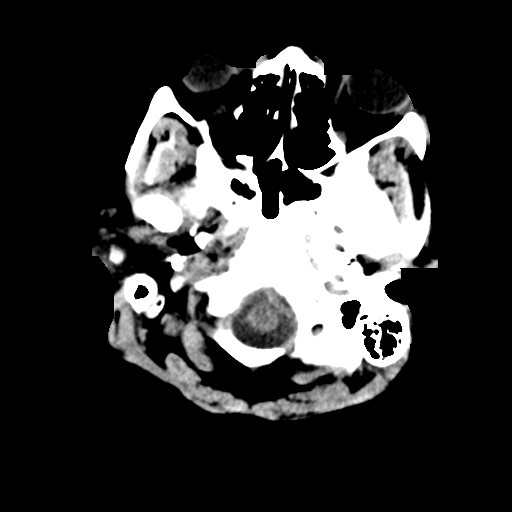
[im 3/30  bone]
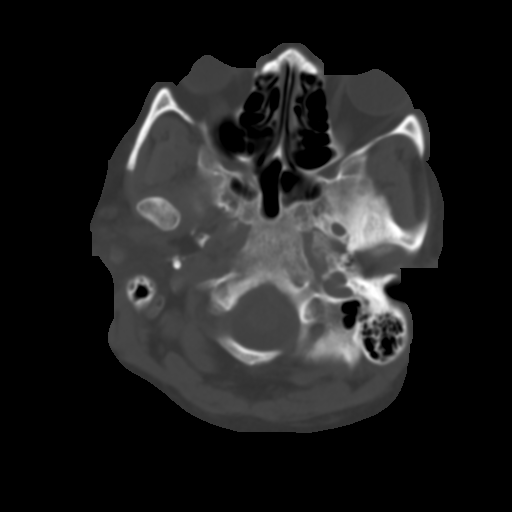
[im 6/30  brain]
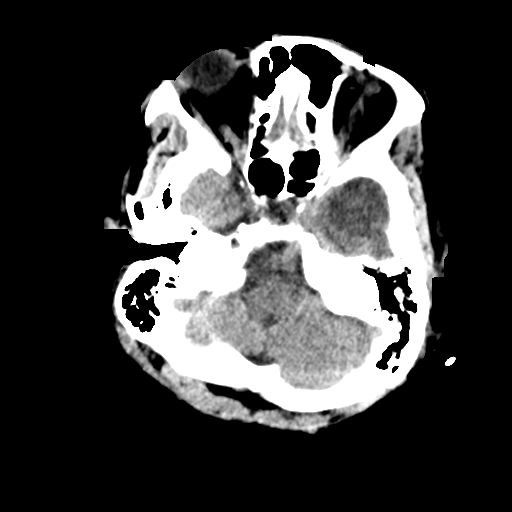
[im 9/30  brain]
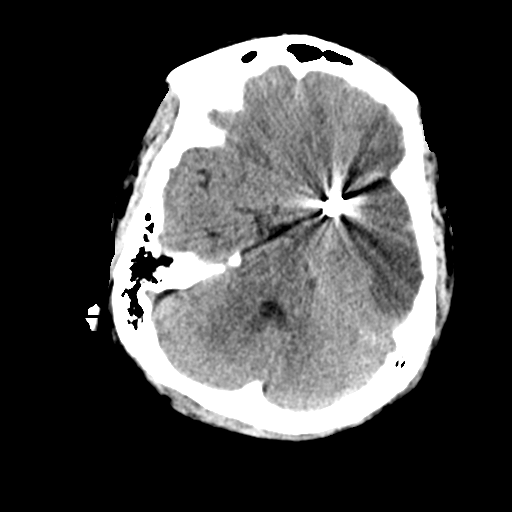
[im 12/30  brain]
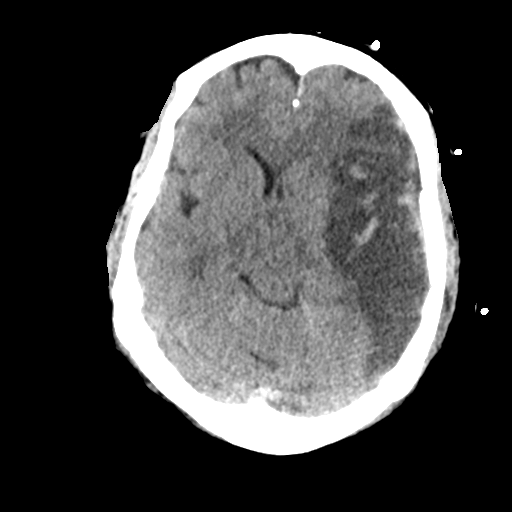
[im 16/30  brain]
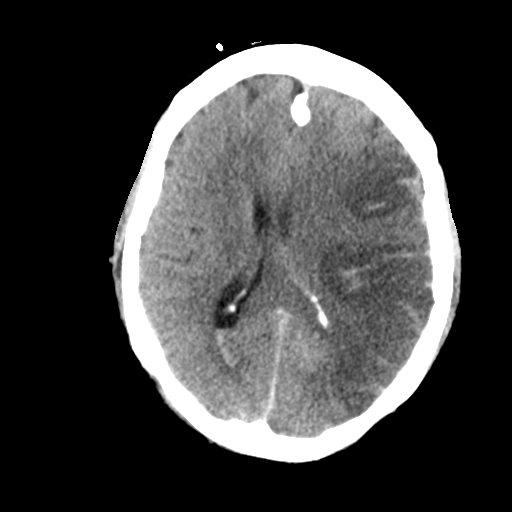
[im 16/30  bone]
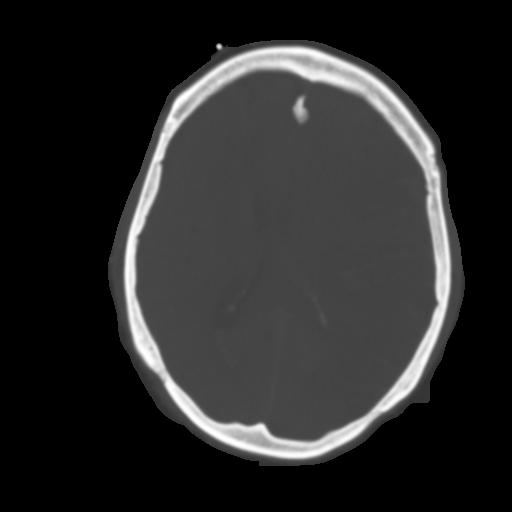
[im 19/30  brain]
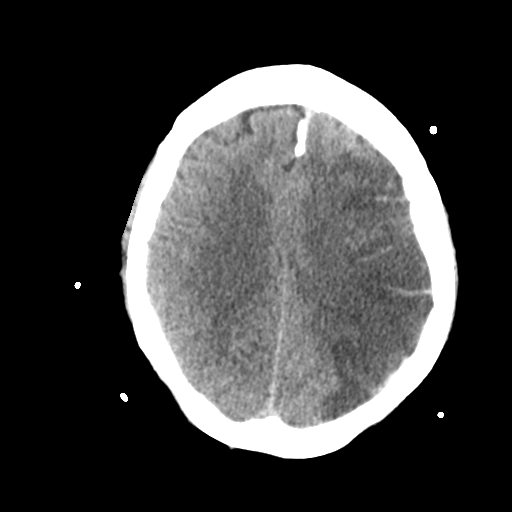
[im 22/30  brain]
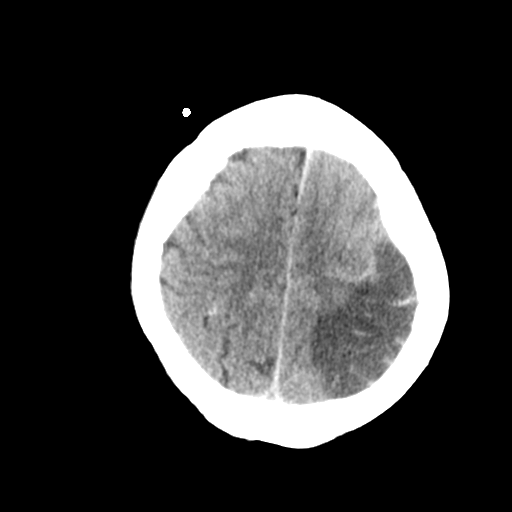
[im 25/30  brain]
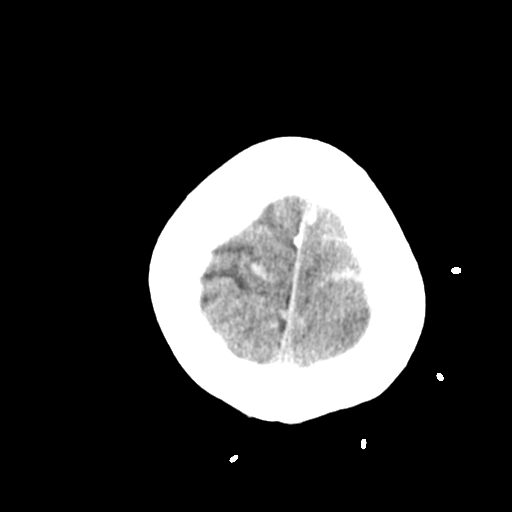
[im 28/30  brain]
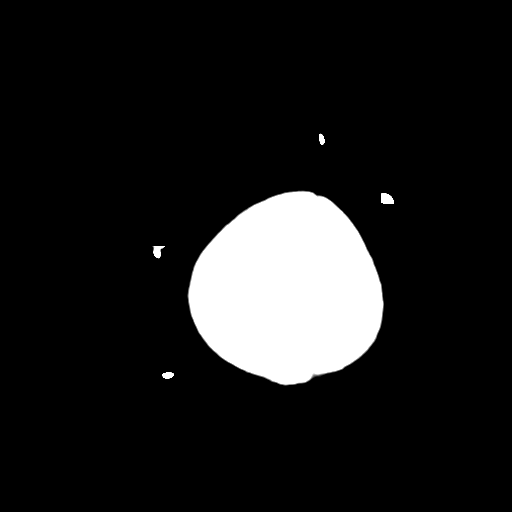
[im 28/30  bone]
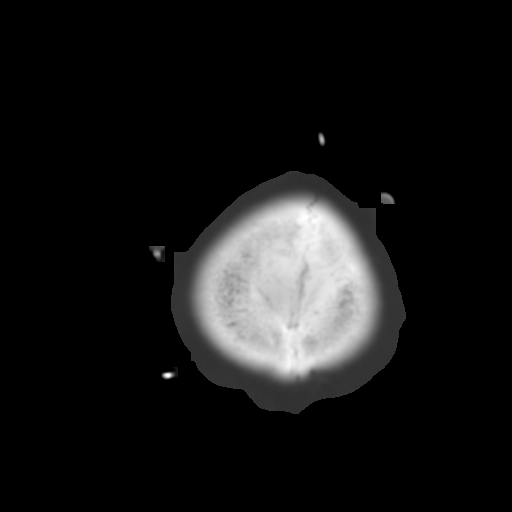

[Series 5: head 3.0 mpr cor · coronal · 0.32mm/px · 3 of 65 slices shown]
[im 22/65  brain]
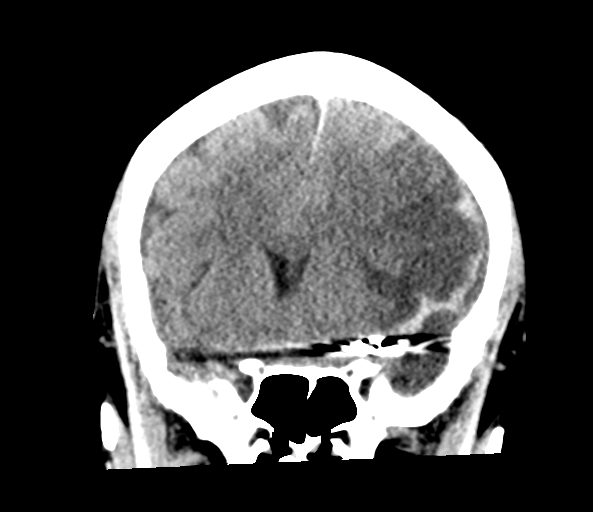
[im 29/65  brain]
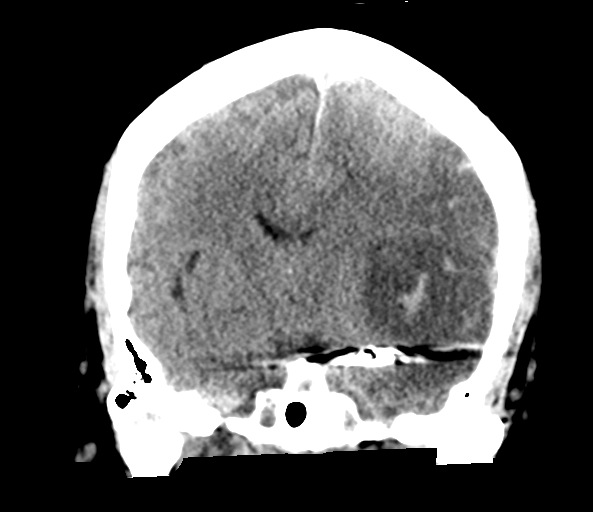
[im 36/65  brain]
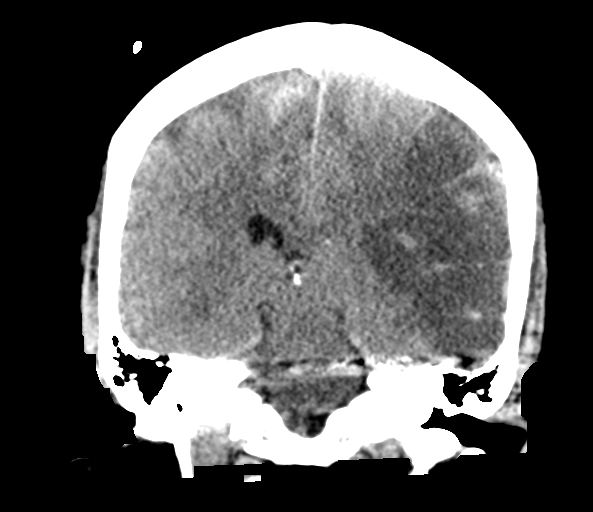

[Series 6: head 3.0 mpr sag · sagittal · 0.32mm/px · 3 of 56 slices shown]
[im 19/56  brain]
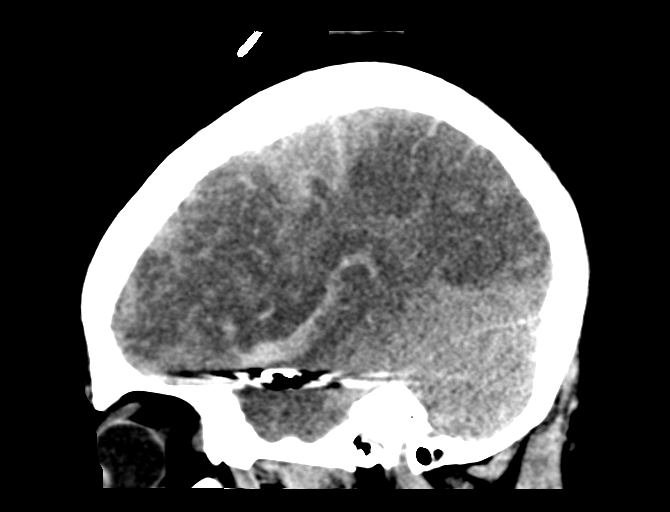
[im 28/56  brain]
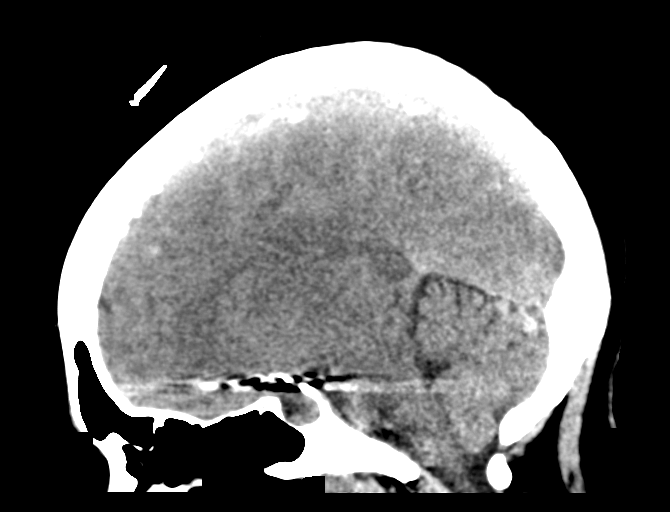
[im 37/56  brain]
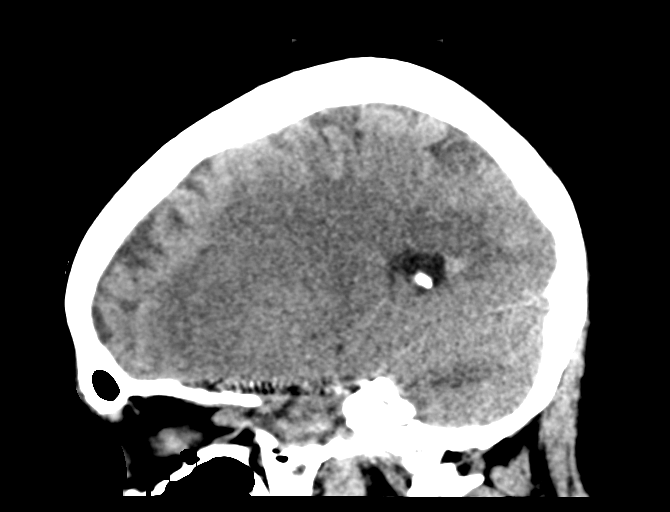

[15 of 47 positions shown; findings below may reference images not displayed]

FINDINGS: Brain: Large left MCA territory infarction is again identified. Mass
effect is similar with rightward midline shift measuring 7 mm
(previously 6 mm). There is no ventricle trapping or hydrocephalus.

Left much greater than right sulcal subarachnoid hemorrhage is not
substantially changed. There is similar layering hemorrhage in the
right occipital horn.

No new loss of gray-white differentiation.

Vascular: Post coiling of left MCA aneurysm with associated streak
artifact.

Skull: Calvarium is unremarkable.

Sinuses/Orbits: No acute finding.

Other: None.
IMPRESSION: Evolving large left MCA territory infarction with similar mass
effect. No hydrocephalus.

Similar subarachnoid hemorrhage.

## 2022-05-25 IMAGING — DX DG CHEST 1V PORT
1 series · 1 of 1 positions shown · non-contrast
Comparison: Two days ago

CLINICAL DATA: Subarachnoid hemorrhage

EXAM:
PORTABLE CHEST 1 VIEW

[chest]
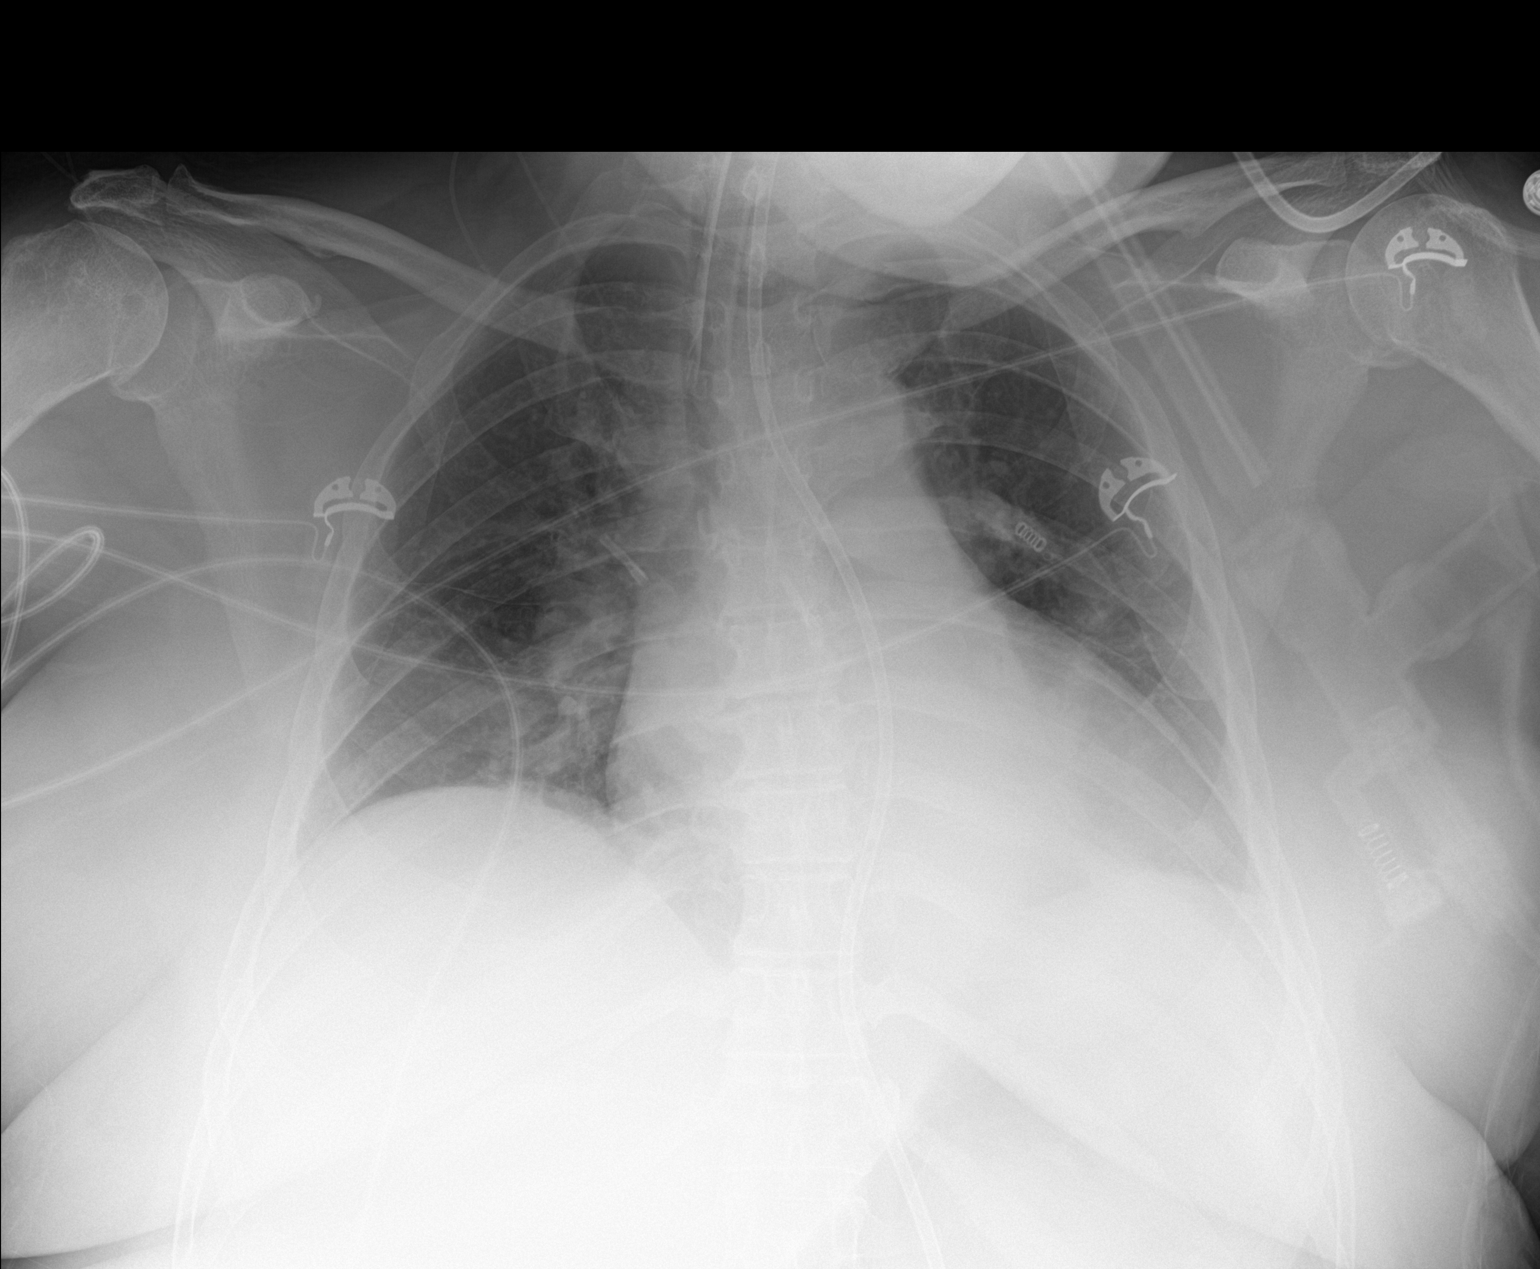

[1 of 1 positions shown; findings below may reference images not displayed]

FINDINGS: Stable cardiomegaly. Endotracheal tube with tip at the clavicular
heads. The enteric tube at least reaches the stomach. Mildly
improved retrocardiac aeration with better seen diaphragm. No
effusion or air leak.
IMPRESSION: 1. Unremarkable hardware positioning.
2. Mildly improved retrocardiac aeration.

## 2022-05-26 ENCOUNTER — Encounter (HOSPITAL_COMMUNITY): Payer: Self-pay

## 2022-05-26 ENCOUNTER — Encounter (HOSPITAL_COMMUNITY)
Admission: RE | Admit: 2022-05-26 | Discharge: 2022-05-26 | Disposition: A | Discharge status: Home / Self Care | Payer: Medicaid Other | Source: Ambulatory Visit | Attending: Obstetrics & Gynecology | Admitting: Obstetrics & Gynecology

## 2022-05-26 ENCOUNTER — Encounter (HOSPITAL_COMMUNITY): Payer: Self-pay | Admitting: Certified Registered Nurse Anesthetist

## 2022-05-26 DIAGNOSIS — R9431 Abnormal electrocardiogram [ECG] [EKG]: Secondary | ICD-10-CM | POA: Diagnosis not present

## 2022-05-26 DIAGNOSIS — Z01818 Encounter for other preprocedural examination: Secondary | ICD-10-CM | POA: Diagnosis present

## 2022-05-26 DIAGNOSIS — I609 Nontraumatic subarachnoid hemorrhage, unspecified: Secondary | ICD-10-CM

## 2022-05-26 DIAGNOSIS — I608 Other nontraumatic subarachnoid hemorrhage: Secondary | ICD-10-CM | POA: Insufficient documentation

## 2022-05-26 DIAGNOSIS — Z9889 Other specified postprocedural states: Secondary | ICD-10-CM | POA: Insufficient documentation

## 2022-05-26 DIAGNOSIS — I493 Ventricular premature depolarization: Secondary | ICD-10-CM | POA: Diagnosis not present

## 2022-05-26 DIAGNOSIS — R000 Tachycardia, unspecified: Secondary | ICD-10-CM | POA: Insufficient documentation

## 2022-05-26 HISTORY — DX: Cerebral infarction, unspecified: I63.9

## 2022-05-26 LAB — COMPREHENSIVE METABOLIC PANEL
ALT: 15 U/L (ref 0–44)
AST: 20 U/L (ref 15–41)
Albumin: 3.2 g/dL — ABNORMAL LOW (ref 3.5–5.0)
Alkaline Phosphatase: 81 U/L (ref 38–126)
Anion gap: 7 (ref 5–15)
BUN: 23 mg/dL (ref 8–23)
CO2: 25 mmol/L (ref 22–32)
Calcium: 10.5 mg/dL — ABNORMAL HIGH (ref 8.9–10.3)
Chloride: 107 mmol/L (ref 98–111)
Creatinine, Ser: 0.71 mg/dL (ref 0.44–1.00)
GFR, Estimated: >60 mL/min (ref >60)
Glucose, Bld: 123 mg/dL — ABNORMAL HIGH (ref 70–99)
Potassium: 3.9 mmol/L (ref 3.5–5.1)
Sodium: 139 mmol/L (ref 135–145)
Total Bilirubin: 0.3 mg/dL (ref 0.3–1.2)
Total Protein: 7.6 g/dL (ref 6.5–8.1)

## 2022-05-26 LAB — CBC
HCT: 37.5 % (ref 36.0–46.0)
Hemoglobin: 11.3 g/dL — ABNORMAL LOW (ref 12.0–15.0)
MCH: 26 pg (ref 26.0–34.0)
MCHC: 30.1 g/dL (ref 30.0–36.0)
MCV: 86.4 fL (ref 80.0–100.0)
RBC: 4.34 MIL/uL (ref 3.87–5.11)
RDW: 15.3 % (ref 11.5–15.5)
WBC: 4.6 10*3/uL (ref 4.0–10.5)
nRBC: 0 % (ref 0.0–0.2)

## 2022-05-26 NOTE — Pre-Procedure Instructions (Signed)
Daughter and POA, Denise Savage has some concerns about patient's surgery. I sent a text to Dr Charlotta Newton with daughter's name and number for her to call daughter. I also asked Dr Charlotta Newton about holding patient's blood thinner.

## 2022-05-26 NOTE — Patient Instructions (Signed)
Denise Savage  05/26/2022     @PREFPERIOPPHARMACY @   Your procedure is scheduled on  05/31/2022.   Report to [facility] at  1050  A.M.   Call this number if you have problems the morning of surgery:  [phone]  If you experience any cold or flu symptoms such as cough, fever, chills, shortness of breath, etc. between now and your scheduled surgery, please notify [phone] at the above number.   Remember:  Do not eat or drink after midnight.      Take these medicines the morning of surgery with A SIP OF WATER        nuedexta, escitalopram, robinul, vimpat, keppra, ativan, omeprazole, valporic acid.     Do not wear jewelry, make-up or nail polish.  Do not wear lotions, powders, or perfumes, or deodorant.  Do not shave 48 hours prior to surgery.  Men may shave face and neck.  Do not bring valuables to the hospital.  [facility] is not responsible for any belongings or valuables.  Contacts, dentures or bridgework may not be worn into surgery.  Leave your suitcase in the car.  After surgery it may be brought to your room.  For patients admitted to the hospital, discharge time will be determined by your treatment team.  Patients discharged the day of surgery will not be allowed to drive home and must have someone with them for 24 hours.    Special instructions:   DO NOT smoke tobacco or vape for 24 hours before your procedure.  Please read over the following fact sheets that you were given. Anesthesia Post-op Instructions and Care and Recovery After Surgery      Dilation and Curettage or Vacuum Curettage, Care After The following information offers guidance on how to care for yourself after your procedure. Your doctor may also give you more specific instructions. If you have problems or questions, contact your doctor. What can I expect after the procedure? After the procedure, it is common to have: Mild pain or cramps. Some bleeding or spotting from the  vagina. These may last for up to 2 weeks. Follow these instructions at home: Medicines Take over-the-counter and prescription medicines only as told by your doctor. If told, take steps to prevent problems with pooping (constipation). You may need to: Drink enough fluid to keep your pee (urine) pale yellow. Take medicines. You will be told what medicines to take. Eat foods that are high in fiber. These include beans, whole grains, and fresh fruits and vegetables. Limit foods that are high in fat and sugar. These include fried or sweet foods. Ask your doctor if you should avoid driving or using machines while you are taking your medicine. Activity  If you were given a medicine to help you relax (sedative) during your procedure, it can affect you for many hours. Do not drive or use machinery until your doctor says that it is safe. Rest as told by your doctor. Get up to take short walks every 1-2 hours. Ask for help if you feel weak or unsteady. Do not lift anything that is heavier than 10 lb (4.5 kg), or the limit that you are told. Return to your normal activities when your doctor says that it is safe. Lifestyle For at least 2 weeks, or as long as told by your doctor: Do not douche. Do not use tampons. Do not have sex. General instructions Do not take baths, swim, or use a hot tub.  Ask your doctor if you may take showers. Do not smoke or use any products that contain nicotine or tobacco. These can delay healing. If you need help quitting, ask your doctor. Wear compression stockings as told by your doctor. It is up to you to get the results of your procedure. Ask how to get your results when they are ready. Keep all follow-up visits. Contact a doctor if: You have very bad cramps that get worse or do not get better with medicine. You have very bad pain in your belly (abdomen). You cannot drink fluids without vomiting. You have pain in the area just above your thighs. You have fluid from  your vagina that smells bad. You have a rash. Get help right away if: You are bleeding a lot from your vagina. This means soaking more than one sanitary pad in 1 hour, and this happens for 2 hours in a row. You have a fever that is above 100.76F (38C). Your belly feels very tender or hard. You have chest pain. You have trouble breathing. You feel dizzy or light-headed. You faint. You have pain in your neck or shoulder area. These symptoms may be an emergency. Get help right away. Call your local emergency services (911 in the [place] Do not wait to see if the symptoms will go away. Do not drive yourself to the hospital. Summary After your procedure, it is common to have pain or cramping. It is also common to have bleeding or spotting from your vagina. Rest as told. Get up to take short walks every 1-2 hours. Do not lift anything that is heavier than 10 lb (4.5 kg), or the limit that you are told. Get help right away if you have problems from the procedure. Ask your doctor what problems to watch for. This information is not intended to replace advice given to you by your health care provider. Make sure you discuss any questions you have with your health care provider. Document Revised: 05/20/2020 Document Reviewed: 05/20/2020 Elsevier Patient Education  [place] Anesthesia, Adult, Care After The following information offers guidance on how to care for yourself after your procedure. Your health care provider may also give you more specific instructions. If you have problems or questions, contact your health care provider. What can I expect after the procedure? After the procedure, it is common for people to: Have pain or discomfort at the IV site. Have nausea or vomiting. Have a sore throat or hoarseness. Have trouble concentrating. Feel cold or chills. Feel weak, sleepy, or tired (fatigue). Have soreness and body aches. These can affect parts of the body that were  not involved in surgery. Follow these instructions at home: For the time period you were told by your health care provider:  Rest. Do not participate in activities where you could fall or become injured. Do not drive or use machinery. Do not drink alcohol. Do not take sleeping pills or medicines that cause drowsiness. Do not make important decisions or sign legal documents. Do not take care of children on your own. General instructions Drink enough fluid to keep your urine pale yellow. If you have sleep apnea, surgery and certain medicines can increase your risk for breathing problems. Follow instructions from your health care provider about wearing your sleep device: Anytime you are sleeping, including during daytime naps. While taking prescription pain medicines, sleeping medicines, or medicines that make you drowsy. Return to your normal activities as told by your health care provider. Ask your  health care provider what activities are safe for you. Take over-the-counter and prescription medicines only as told by your health care provider. Do not use any products that contain nicotine or tobacco. These products include cigarettes, chewing tobacco, and vaping devices, such as e-cigarettes. These can delay incision healing after surgery. If you need help quitting, ask your health care provider. Contact a health care provider if: You have nausea or vomiting that does not get better with medicine. You vomit every time you eat or drink. You have pain that does not get better with medicine. You cannot urinate or have bloody urine. You develop a skin rash. You have a fever. Get help right away if: You have trouble breathing. You have chest pain. You vomit blood. These symptoms may be an emergency. Get help right away. Call 911. Do not wait to see if the symptoms will go away. Do not drive yourself to the hospital. Summary After the procedure, it is common to have a sore throat,  hoarseness, nausea, vomiting, or to feel weak, sleepy, or fatigue. For the time period you were told by your health care provider, do not drive or use machinery. Get help right away if you have difficulty breathing, have chest pain, or vomit blood. These symptoms may be an emergency. This information is not intended to replace advice given to you by your health care provider. Make sure you discuss any questions you have with your health care provider. Document Revised: 08/27/2021 Document Reviewed: 08/27/2021 Elsevier Patient Education  [place].

## 2022-05-27 NOTE — Progress Notes (Signed)
Patient procedure is cancelled for D&C per Dr Charlotta Newton.

## 2022-05-31 ENCOUNTER — Telehealth: Payer: Self-pay | Admitting: Obstetrics & Gynecology

## 2022-05-31 ENCOUNTER — Encounter (HOSPITAL_COMMUNITY): Admission: RE | Payer: Self-pay | Source: Home / Self Care

## 2022-05-31 ENCOUNTER — Ambulatory Visit (HOSPITAL_COMMUNITY)
Admission: RE | Admit: 2022-05-31 | Payer: Medicaid Other | Source: Home / Self Care | Admitting: Obstetrics & Gynecology

## 2022-05-31 DIAGNOSIS — Z01818 Encounter for other preprocedural examination: Secondary | ICD-10-CM

## 2022-05-31 SURGERY — DILATATION AND CURETTAGE /HYSTEROSCOPY
Anesthesia: General

## 2022-05-31 NOTE — Telephone Encounter (Signed)
The patients daughter [name] called this morning stating that she went and visited her mother at the nursing home and they showed them her moms depends and the patient has been blood and passing a lot of clots. Daughter understands that they canceled the surgery for their mother but now they are wanting to talk to you about rescheduling due to the clots that she is passing. She states if you would call her at [phone]

## 2022-06-02 ENCOUNTER — Other Ambulatory Visit (HOSPITAL_COMMUNITY): Payer: Medicaid Other

## 2022-07-13 ENCOUNTER — Observation Stay (HOSPITAL_COMMUNITY)
Admission: EM | Admit: 2022-07-13 | Discharge: 2022-07-14 | DRG: 760 | Disposition: A | Discharge status: Skilled Nursing Facility | Payer: Medicaid Other | Source: Skilled Nursing Facility | Attending: Family Medicine | Admitting: Family Medicine

## 2022-07-13 ENCOUNTER — Emergency Department (HOSPITAL_COMMUNITY): Payer: Medicaid Other

## 2022-07-13 ENCOUNTER — Other Ambulatory Visit: Payer: Self-pay

## 2022-07-13 ENCOUNTER — Inpatient Hospital Stay (HOSPITAL_COMMUNITY): Payer: Medicaid Other

## 2022-07-13 ENCOUNTER — Encounter (HOSPITAL_COMMUNITY): Payer: Self-pay

## 2022-07-13 DIAGNOSIS — Z86711 Personal history of pulmonary embolism: Secondary | ICD-10-CM | POA: Diagnosis not present

## 2022-07-13 DIAGNOSIS — N938 Other specified abnormal uterine and vaginal bleeding: Secondary | ICD-10-CM | POA: Diagnosis not present

## 2022-07-13 DIAGNOSIS — Z885 Allergy status to narcotic agent status: Secondary | ICD-10-CM | POA: Diagnosis not present

## 2022-07-13 DIAGNOSIS — I2699 Other pulmonary embolism without acute cor pulmonale: Secondary | ICD-10-CM | POA: Diagnosis present

## 2022-07-13 DIAGNOSIS — Z515 Encounter for palliative care: Secondary | ICD-10-CM | POA: Diagnosis not present

## 2022-07-13 DIAGNOSIS — Z7189 Other specified counseling: Secondary | ICD-10-CM

## 2022-07-13 DIAGNOSIS — K219 Gastro-esophageal reflux disease without esophagitis: Secondary | ICD-10-CM | POA: Diagnosis not present

## 2022-07-13 DIAGNOSIS — I609 Nontraumatic subarachnoid hemorrhage, unspecified: Secondary | ICD-10-CM

## 2022-07-13 DIAGNOSIS — Z993 Dependence on wheelchair: Secondary | ICD-10-CM | POA: Diagnosis not present

## 2022-07-13 DIAGNOSIS — Z931 Gastrostomy status: Secondary | ICD-10-CM | POA: Diagnosis not present

## 2022-07-13 DIAGNOSIS — Z79899 Other long term (current) drug therapy: Secondary | ICD-10-CM

## 2022-07-13 DIAGNOSIS — N950 Postmenopausal bleeding: Secondary | ICD-10-CM | POA: Diagnosis not present

## 2022-07-13 DIAGNOSIS — I4891 Unspecified atrial fibrillation: Secondary | ICD-10-CM | POA: Diagnosis present

## 2022-07-13 DIAGNOSIS — R1310 Dysphagia, unspecified: Secondary | ICD-10-CM | POA: Diagnosis not present

## 2022-07-13 DIAGNOSIS — F32A Depression, unspecified: Secondary | ICD-10-CM | POA: Diagnosis not present

## 2022-07-13 DIAGNOSIS — R9389 Abnormal findings on diagnostic imaging of other specified body structures: Secondary | ICD-10-CM | POA: Diagnosis present

## 2022-07-13 DIAGNOSIS — Z8679 Personal history of other diseases of the circulatory system: Secondary | ICD-10-CM | POA: Diagnosis not present

## 2022-07-13 DIAGNOSIS — N8500 Endometrial hyperplasia, unspecified: Secondary | ICD-10-CM | POA: Diagnosis present

## 2022-07-13 DIAGNOSIS — I608 Other nontraumatic subarachnoid hemorrhage: Secondary | ICD-10-CM | POA: Diagnosis present

## 2022-07-13 DIAGNOSIS — D649 Anemia, unspecified: Secondary | ICD-10-CM | POA: Diagnosis present

## 2022-07-13 DIAGNOSIS — R4701 Aphasia: Secondary | ICD-10-CM | POA: Diagnosis present

## 2022-07-13 DIAGNOSIS — Z7901 Long term (current) use of anticoagulants: Secondary | ICD-10-CM

## 2022-07-13 DIAGNOSIS — F419 Anxiety disorder, unspecified: Secondary | ICD-10-CM | POA: Diagnosis not present

## 2022-07-13 DIAGNOSIS — I619 Nontraumatic intracerebral hemorrhage, unspecified: Secondary | ICD-10-CM | POA: Diagnosis present

## 2022-07-13 DIAGNOSIS — Z9889 Other specified postprocedural states: Secondary | ICD-10-CM

## 2022-07-13 DIAGNOSIS — R569 Unspecified convulsions: Secondary | ICD-10-CM

## 2022-07-13 LAB — FOLATE: Folate: 29.9 ng/mL (ref >5.9)

## 2022-07-13 LAB — URINALYSIS, ROUTINE W REFLEX MICROSCOPIC
Bilirubin Urine: NEGATIVE
Glucose, UA: NEGATIVE mg/dL
Ketones, ur: NEGATIVE mg/dL
Nitrite: NEGATIVE
Protein, ur: NEGATIVE mg/dL
RBC / HPF: >50 RBC/hpf (ref 0–5)
Specific Gravity, Urine: 1.017 (ref 1.005–1.030)
pH: 7 (ref 5.0–8.0)

## 2022-07-13 LAB — CBC WITH DIFFERENTIAL/PLATELET
Abs Immature Granulocytes: 0 10*3/uL (ref 0.00–0.07)
Basophils Absolute: 0 10*3/uL (ref 0.0–0.1)
Basophils Relative: 1 %
Eosinophils Absolute: 0 10*3/uL (ref 0.0–0.5)
Eosinophils Relative: 1 %
HCT: 21.7 % — ABNORMAL LOW (ref 36.0–46.0)
Hemoglobin: 5.6 g/dL — CL (ref 12.0–15.0)
Immature Granulocytes: 0 %
Lymphocytes Relative: 36 %
Lymphs Abs: 1.5 10*3/uL (ref 0.7–4.0)
MCH: 19.5 pg — ABNORMAL LOW (ref 26.0–34.0)
MCHC: 25.8 g/dL — ABNORMAL LOW (ref 30.0–36.0)
MCV: 75.6 fL — ABNORMAL LOW (ref 80.0–100.0)
Monocytes Absolute: 0.6 10*3/uL (ref 0.1–1.0)
Monocytes Relative: 14 %
Neutro Abs: 2.1 10*3/uL (ref 1.7–7.7)
Neutrophils Relative %: 48 %
Platelets: 283 10*3/uL (ref 150–400)
RBC: 2.87 MIL/uL — ABNORMAL LOW (ref 3.87–5.11)
RDW: 19.4 % — ABNORMAL HIGH (ref 11.5–15.5)
WBC: 4.2 10*3/uL (ref 4.0–10.5)
nRBC: 0.5 % — ABNORMAL HIGH (ref 0.0–0.2)

## 2022-07-13 LAB — COMPREHENSIVE METABOLIC PANEL
ALT: 7 U/L (ref 0–44)
AST: 13 U/L — ABNORMAL LOW (ref 15–41)
Albumin: 2.9 g/dL — ABNORMAL LOW (ref 3.5–5.0)
Alkaline Phosphatase: 78 U/L (ref 38–126)
Anion gap: 7 (ref 5–15)
BUN: 17 mg/dL (ref 8–23)
CO2: 28 mmol/L (ref 22–32)
Calcium: 10.2 mg/dL (ref 8.9–10.3)
Chloride: 104 mmol/L (ref 98–111)
Creatinine, Ser: 0.78 mg/dL (ref 0.44–1.00)
GFR, Estimated: >60 mL/min (ref >60)
Glucose, Bld: 106 mg/dL — ABNORMAL HIGH (ref 70–99)
Potassium: 4.4 mmol/L (ref 3.5–5.1)
Sodium: 139 mmol/L (ref 135–145)
Total Bilirubin: 0.1 mg/dL — ABNORMAL LOW (ref 0.3–1.2)
Total Protein: 7.1 g/dL (ref 6.5–8.1)

## 2022-07-13 LAB — PROTIME-INR
INR: 1.3 — ABNORMAL HIGH (ref 0.8–1.2)
INR: 1.2 (ref 0.8–1.2)
Prothrombin Time: 16.5 seconds — ABNORMAL HIGH (ref 11.4–15.2)
Prothrombin Time: 15.4 seconds — ABNORMAL HIGH (ref 11.4–15.2)

## 2022-07-13 LAB — HEMOGLOBIN AND HEMATOCRIT, BLOOD
HCT: 31.5 % — ABNORMAL LOW (ref 36.0–46.0)
HCT: 29 % — ABNORMAL LOW (ref 36.0–46.0)
Hemoglobin: 9.2 g/dL — ABNORMAL LOW (ref 12.0–15.0)
Hemoglobin: 8.5 g/dL — ABNORMAL LOW (ref 12.0–15.0)

## 2022-07-13 LAB — MAGNESIUM: Magnesium: 2 mg/dL (ref 1.7–2.4)

## 2022-07-13 LAB — POC OCCULT BLOOD, ED: Fecal Occult Bld: NEGATIVE

## 2022-07-13 LAB — HIV ANTIBODY (ROUTINE TESTING W REFLEX): HIV Screen 4th Generation wRfx: NONREACTIVE

## 2022-07-13 LAB — IRON AND TIBC
Iron: 81 ug/dL (ref 28–170)
Saturation Ratios: 23 % (ref 10.4–31.8)
TIBC: 346 ug/dL (ref 250–450)
UIBC: 265 ug/dL

## 2022-07-13 LAB — VITAMIN B12: Vitamin B-12: 836 pg/mL (ref 180–914)

## 2022-07-13 LAB — PREPARE RBC (CROSSMATCH)

## 2022-07-13 LAB — PHOSPHORUS: Phosphorus: 2.6 mg/dL (ref 2.5–4.6)

## 2022-07-13 MED ORDER — LACOSAMIDE 50 MG PO TABS
100.0000 mg | ORAL_TABLET | Freq: Two times a day (BID) | ORAL | Status: DC
  Administered 2022-07-13 – 2022-07-14 (×2): 100 mg
  Filled 2022-07-13 (×2): qty 2

## 2022-07-13 MED ORDER — SODIUM CHLORIDE 0.9% FLUSH
3.0000 mL | Freq: Two times a day (BID) | INTRAVENOUS | Status: DC
  Administered 2022-07-14: 3 mL via INTRAVENOUS

## 2022-07-13 MED ORDER — ISOSOURCE 1.5 CAL PO LIQD: 237.0000 mL | Freq: Every day | ORAL | Status: DC

## 2022-07-13 MED ORDER — HYDROMORPHONE HCL 1 MG/ML IJ SOLN: 0.5000 mg | INTRAMUSCULAR | Status: DC | PRN

## 2022-07-13 MED ORDER — ACETAMINOPHEN 325 MG PO TABS: 650.0000 mg | ORAL_TABLET | Freq: Four times a day (QID) | ORAL | Status: DC | PRN

## 2022-07-13 MED ORDER — SODIUM CHLORIDE 0.9 % IV SOLN: 250.0000 mL | INTRAVENOUS | Status: DC | PRN

## 2022-07-13 MED ORDER — OMEPRAZOLE 2 MG/ML ORAL SUSPENSION: 20.0000 mg | Freq: Every day | ORAL | Status: DC

## 2022-07-13 MED ORDER — OSMOLITE 1.5 CAL PO LIQD
237.0000 mL | Freq: Every day | ORAL | Status: DC
  Administered 2022-07-13 – 2022-07-14 (×4): 237 mL
  Filled 2022-07-13 (×5): qty 237

## 2022-07-13 MED ORDER — SODIUM CHLORIDE 0.9% FLUSH
3.0000 mL | Freq: Two times a day (BID) | INTRAVENOUS | Status: DC
  Administered 2022-07-13: 3 mL via INTRAVENOUS

## 2022-07-13 MED ORDER — LEVETIRACETAM 500 MG PO TABS
1000.0000 mg | ORAL_TABLET | Freq: Two times a day (BID) | ORAL | Status: DC
  Administered 2022-07-13 – 2022-07-14 (×2): 1000 mg
  Filled 2022-07-13 (×2): qty 2

## 2022-07-13 MED ORDER — VALPROIC ACID 250 MG/5ML PO SOLN
125.0000 mg | Freq: Two times a day (BID) | ORAL | Status: DC
  Administered 2022-07-13 – 2022-07-14 (×3): 125 mg
  Filled 2022-07-13 (×5): qty 2.5

## 2022-07-13 MED ORDER — LEVALBUTEROL HCL 0.63 MG/3ML IN NEBU: 0.6300 mg | INHALATION_SOLUTION | Freq: Four times a day (QID) | RESPIRATORY_TRACT | Status: DC | PRN

## 2022-07-13 MED ORDER — SODIUM CHLORIDE 0.9% IV SOLUTION: Freq: Once | INTRAVENOUS | Status: AC

## 2022-07-13 MED ORDER — TRAZODONE HCL 50 MG PO TABS
25.0000 mg | ORAL_TABLET | Freq: Every evening | ORAL | Status: DC | PRN
  Administered 2022-07-13: 25 mg via ORAL
  Filled 2022-07-13: qty 1

## 2022-07-13 MED ORDER — SODIUM CHLORIDE 0.9% FLUSH: 3.0000 mL | INTRAVENOUS | Status: DC | PRN

## 2022-07-13 MED ORDER — MEDROXYPROGESTERONE ACETATE 150 MG/ML IM SUSP
150.0000 mg | Freq: Once | INTRAMUSCULAR | Status: AC
  Administered 2022-07-13: 150 mg via INTRAMUSCULAR
  Filled 2022-07-13: qty 1

## 2022-07-13 MED ORDER — GLYCOPYRROLATE 1 MG PO TABS
1.0000 mg | ORAL_TABLET | Freq: Three times a day (TID) | ORAL | Status: DC | PRN
  Filled 2022-07-13: qty 1

## 2022-07-13 MED ORDER — IPRATROPIUM BROMIDE 0.02 % IN SOLN: 0.5000 mg | Freq: Four times a day (QID) | RESPIRATORY_TRACT | Status: DC | PRN

## 2022-07-13 MED ORDER — DEXTROMETHORPHAN-QUINIDINE 20-10 MG PO CAPS: 1.0000 | ORAL_CAPSULE | Freq: Two times a day (BID) | ORAL | Status: DC

## 2022-07-13 MED ORDER — FREE WATER: 30.0000 mL | Status: DC | PRN

## 2022-07-13 MED ORDER — ONDANSETRON HCL 4 MG PO TABS: 4.0000 mg | ORAL_TABLET | Freq: Four times a day (QID) | ORAL | Status: DC | PRN

## 2022-07-13 MED ORDER — ONDANSETRON HCL 4 MG/2ML IJ SOLN: 4.0000 mg | Freq: Four times a day (QID) | INTRAMUSCULAR | Status: DC | PRN

## 2022-07-13 MED ORDER — SODIUM CHLORIDE 0.9 % IV SOLN: INTRAVENOUS | Status: DC

## 2022-07-13 MED ORDER — FLEET ENEMA 7-19 GM/118ML RE ENEM: 1.0000 | ENEMA | Freq: Once | RECTAL | Status: DC | PRN

## 2022-07-13 MED ORDER — FAMOTIDINE 20 MG PO TABS
20.0000 mg | ORAL_TABLET | Freq: Every day | ORAL | Status: DC
  Administered 2022-07-14: 20 mg
  Filled 2022-07-13: qty 1

## 2022-07-13 MED ORDER — HYDRALAZINE HCL 20 MG/ML IJ SOLN: 10.0000 mg | INTRAMUSCULAR | Status: DC | PRN

## 2022-07-13 MED ORDER — MELATONIN 3 MG PO TABS
9.0000 mg | ORAL_TABLET | Freq: Every day | ORAL | Status: DC
  Administered 2022-07-13: 9 mg
  Filled 2022-07-13: qty 3

## 2022-07-13 MED ORDER — LORAZEPAM 1 MG PO TABS
1.0000 mg | ORAL_TABLET | Freq: Three times a day (TID) | ORAL | Status: DC | PRN
  Administered 2022-07-13: 1 mg
  Filled 2022-07-13: qty 1

## 2022-07-13 MED ORDER — MELATONIN 5 MG PO TABS
10.0000 mg | ORAL_TABLET | Freq: Every day | ORAL | Status: DC
  Filled 2022-07-13 (×2): qty 2

## 2022-07-13 MED ORDER — BISACODYL 5 MG PO TBEC: 5.0000 mg | DELAYED_RELEASE_TABLET | Freq: Every day | ORAL | Status: DC | PRN

## 2022-07-13 MED ORDER — ESCITALOPRAM OXALATE 10 MG PO TABS
20.0000 mg | ORAL_TABLET | Freq: Every morning | ORAL | Status: DC
  Administered 2022-07-13 – 2022-07-14 (×2): 20 mg
  Filled 2022-07-13 (×2): qty 2

## 2022-07-13 MED ORDER — ACETAMINOPHEN 325 MG PO TABS
650.0000 mg | ORAL_TABLET | Freq: Four times a day (QID) | ORAL | Status: DC | PRN
  Administered 2022-07-13: 650 mg
  Filled 2022-07-13: qty 2

## 2022-07-13 MED ORDER — SENNOSIDES-DOCUSATE SODIUM 8.6-50 MG PO TABS: 1.0000 | ORAL_TABLET | Freq: Every evening | ORAL | Status: DC | PRN

## 2022-07-13 MED ORDER — HEPARIN SODIUM (PORCINE) 5000 UNIT/ML IJ SOLN: 5000.0000 [IU] | Freq: Three times a day (TID) | INTRAMUSCULAR | Status: DC

## 2022-07-13 MED ORDER — ACETAMINOPHEN 650 MG RE SUPP: 650.0000 mg | Freq: Four times a day (QID) | RECTAL | Status: DC | PRN

## 2022-07-13 MED ORDER — OXYCODONE HCL 5 MG PO TABS
5.0000 mg | ORAL_TABLET | ORAL | Status: DC | PRN
  Administered 2022-07-13: 5 mg via ORAL
  Filled 2022-07-13: qty 1

## 2022-07-13 NOTE — ED Provider Notes (Signed)
Central EMERGENCY DEPARTMENT AT [facility] Provider Note   CSN: [id number] Arrival date & time: 07/13/22  4166     History  Chief Complaint  Patient presents with   Abnormal Lab    Denise Savage is a 62 y.o. female.  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  Per notes in Epic, pt had some problems with vaginal bleeding late last year.  She was scheduled for a D&C in December, but it was canceled.  Daughters said someone was supposed to call and reschedule, but it's never been rescheduled.  Pt had some blood work done at her facility and hgb came back low at 6.  Pt is unable to give any hx.  EMS was not told pt has had any issues with bleeding.         Home Medications Prior to Admission medications   Medication Sig Start Date End Date Taking? Authorizing Provider  acetaminophen (TYLENOL) 325 MG tablet Place 650 mg into feeding tube every 6 (six) hours as needed (mild pain).   Yes [provider]  Dextromethorphan-quiNIDine (NUEDEXTA) 20-10 MG capsule 1 capsule every 12 (twelve) hours. Via G-Tube (0900 & 2100)   Yes [provider]  ELIQUIS 5 MG TABS tablet Place 1 tablet (5 mg total) into feeding tube 2 (two) times daily. (0900 & 2100) Patient taking differently: Place 5 mg into feeding tube 2 (two) times daily. (0800 & 2000) 03/28/21  Yes Lisbeth Renshaw, MD  escitalopram (LEXAPRO) 20 MG tablet Place 20 mg into feeding tube in the morning. (0900)   Yes [provider]  glycopyrrolate (ROBINUL) 1 MG tablet Take 1 mg by mouth every 8 (eight) hours as needed (increased secretions).   Yes [provider]  lacosamide (VIMPAT) 10 MG/ML oral solution Take 10 mLs (100 mg total) by mouth 2 (two) times daily. (0900 & 1700) 03/26/21  Yes Lisbeth Renshaw, MD  levETIRAcetam  (KEPPRA) 1000 MG tablet Place 1,000 mg into feeding tube 2 (two) times daily. (0900 & 2100)   Yes [provider]  LORazepam (ATIVAN) 1 MG tablet Place 1 tablet (1 mg total) into feeding tube every 8 (eight) hours as needed for anxiety. (0000, 0800 & 1600) Patient taking differently: Place 1 mg into feeding tube in the morning, at noon, and at bedtime. (0600, 1400 & 2100) 03/26/21  Yes Lisbeth Renshaw, MD  Melatonin 10 MG TABS Take 10 mg by mouth at bedtime. (2100)   Yes [provider]  Nutritional Supplements (ISOSOURCE 1.5 CAL) LIQD Place 237 mLs into feeding tube 5 (five) times daily. (0600, 1000, 1400, 1800 & 2200)   Yes [provider]  omeprazole (KONVOMEP) 2 mg/mL SUSP oral suspension Place 20 mg into feeding tube daily.   Yes [provider]  traZODone (DESYREL) 50 MG tablet Take 75 mg by mouth at bedtime. (2200) 02/21/21  Yes [provider]  valproic acid (DEPAKENE) 250 MG/5ML solution Place 125 mg into feeding tube 2 (two) times daily. (0900 & 2100)   Yes [provider]  Water For Irrigation, Sterile (FREE WATER) SOLN Place 30-240 mLs into feeding tube See admin instructions. Flush feeding tube with 240 mls of water twice daily between feedings, flush with 60 ml of water before and after initiating feedings &  Flush peg tube with 30 cc  of water before and after medication administration   Yes [provider]      Allergies    Hydrocodone    Review of Systems   Review of Systems  Unable to perform ROS: Patient nonverbal  All other systems reviewed and are negative.   Physical Exam Updated Vital Signs BP 108/66   Pulse 84   Temp 98.8 F (37.1 C) (Axillary)   Resp 17   SpO2 97%  Physical Exam Vitals and nursing note reviewed.  HENT:     Head: Normocephalic and atraumatic.     Right Ear: External ear normal.     Left Ear: External ear normal.     Nose: Nose normal.     Mouth/Throat:     Mouth: Mucous  membranes are dry.  Eyes:     Extraocular Movements: Extraocular movements intact.     Pupils: Pupils are equal, round, and reactive to light.  Cardiovascular:     Rate and Rhythm: Normal rate. Rhythm irregular.     Pulses: Normal pulses.     Heart sounds: Normal heart sounds.  Pulmonary:     Effort: Pulmonary effort is normal.     Breath sounds: Normal breath sounds.  Abdominal:     General: Abdomen is flat. Bowel sounds are normal.     Palpations: Abdomen is soft.     Comments: G-tube in place  Genitourinary:    Rectum: Guaiac result negative.     Comments: Stool is yellow-brown; no blood in diaper. Musculoskeletal:     Cervical back: Normal range of motion and neck supple.     Comments: Contractures upper and lower extremities  Skin:    General: Skin is warm.     Capillary Refill: Capillary refill takes less than 2 seconds.  Neurological:     Mental Status: She is alert. Mental status is at baseline.     Comments: Non-verbal s/p cva; both sides weak with right more than left.   Psychiatric:     Comments: Unable to assess     ED Results / Procedures / Treatments   Labs (all labs ordered are listed, but only abnormal results are displayed) Labs Reviewed  COMPREHENSIVE METABOLIC PANEL - Abnormal; Notable for the following components:      Result Value   Glucose, Bld 106 (*)    Albumin 2.9 (*)    AST 13 (*)    Total Bilirubin 0.1 (*)    All other components within normal limits  CBC WITH DIFFERENTIAL/PLATELET - Abnormal; Notable for the following components:   RBC 2.87 (*)    Hemoglobin 5.6 (*)    HCT 21.7 (*)    MCV 75.6 (*)    MCH 19.5 (*)    MCHC 25.8 (*)    RDW 19.4 (*)    nRBC 0.5 (*)    All other components within normal limits  URINALYSIS, ROUTINE W REFLEX MICROSCOPIC - Abnormal; Notable for the following components:   APPearance CLOUDY (*)    Hgb urine dipstick SMALL (*)    Leukocytes,Ua MODERATE (*)    Bacteria, UA MANY (*)    All other components  within normal limits  PROTIME-INR - Abnormal; Notable for the following components:   Prothrombin Time 16.5 (*)    INR 1.3 (*)    All other components within normal limits  CREATININE, SERUM  HIV ANTIBODY (ROUTINE TESTING W REFLEX)  MAGNESIUM  PHOSPHORUS  PROTIME-INR  IRON AND TIBC  HAPTOGLOBIN  VITAMIN B12  FOLATE  OCCULT BLOOD X 1 CARD TO LAB, STOOL  POC OCCULT BLOOD, ED  TYPE AND SCREEN  PREPARE RBC (CROSSMATCH)  PREPARE RBC (CROSSMATCH)    EKG EKG Interpretation  Date/Time:  Wednesday July 13 2022 09:33:24 EST Ventricular Rate:  88 PR Interval:  133 QRS Duration: 85 QT Interval:  442 QTC Calculation: 538 R Axis:   5 Text Interpretation: Sinus rhythm Paired ventricular premature complexes Aberrant conduction of SV complex(es) Borderline repolarization abnormality Minimal ST elevation, inferior leads Prolonged QT interval Confirmed by Isla Pence [phone]) on 07/13/2022 10:10:22 AM  Radiology DG Chest Portable 1 View  Result Date: 07/13/2022 CLINICAL DATA:  Anemia, abnormal vaginal bleeding 3 weeks ago with large clots EXAM: PORTABLE CHEST 1 VIEW COMPARISON:  Portable exam 1101 hours compared to 01/21/2021 FINDINGS: Upper normal heart size. Mediastinal contours and pulmonary vascularity normal. Atherosclerotic calcification aorta. Linear subsegmental atelectasis LEFT lower lobe. Remaining lungs clear. No acute infiltrate, pleural effusion, or pneumothorax. Bones demineralized. IMPRESSION: Linear subsegmental atelectasis LEFT lower lobe. Aortic Atherosclerosis (ICD10-I70.0). Electronically Signed   By: Lavonia Dana M.D.   On: 07/13/2022 11:15    Procedures Procedures    Medications Ordered in ED Medications  sodium chloride flush (NS) 0.9 % injection 3 mL (has no administration in time range)  0.9 %  sodium chloride infusion (has no administration in time range)  sodium chloride flush (NS) 0.9 % injection 3 mL (3 mLs Intravenous Not Given 07/13/22 1141)  sodium  chloride flush (NS) 0.9 % injection 3 mL (has no administration in time range)  0.9 %  sodium chloride infusion (has no administration in time range)  oxyCODONE (Oxy IR/ROXICODONE) immediate release tablet 5 mg (has no administration in time range)  HYDROmorphone (DILAUDID) injection 0.5-1 mg (has no administration in time range)  traZODone (DESYREL) tablet 25 mg (has no administration in time range)  senna-docusate (Senokot-S) tablet 1 tablet (has no administration in time range)  bisacodyl (DULCOLAX) EC tablet 5 mg (has no administration in time range)  sodium phosphate (FLEET) 7-19 GM/118ML enema 1 enema (has no administration in time range)  ondansetron (ZOFRAN) tablet 4 mg (has no administration in time range)    Or  ondansetron (ZOFRAN) injection 4 mg (has no administration in time range)  ipratropium (ATROVENT) nebulizer solution 0.5 mg (has no administration in time range)  levalbuterol (XOPENEX) nebulizer solution 0.63 mg (has no administration in time range)  hydrALAZINE (APRESOLINE) injection 10 mg (has no administration in time range)  0.9 %  sodium chloride infusion (Manually program via Guardrails IV Fluids) (has no administration in time range)  acetaminophen (TYLENOL) tablet 650 mg (has no administration in time range)  Dextromethorphan-quiNIDine (NUEDEXTA) 20-10 MG per capsule 1 capsule (has no administration in time range)  escitalopram (LEXAPRO) tablet 20 mg (has no administration in time range)  LORazepam (ATIVAN) tablet 1 mg (has no administration in time range)  glycopyrrolate (ROBINUL) tablet 1 mg (has no administration in time range)  omeprazole (KONVOMEP) 2 mg/mL oral suspension 20 mg (has no administration in time range)  Melatonin TABS 10 mg (has no administration in time range)  free water 30-240 mL (has no administration in time range)  lacosamide (VIMPAT) oral solution 100 mg (has no administration in time range)  levETIRAcetam (KEPPRA) tablet 1,000 mg (has no  administration in time range)  valproic acid (DEPAKENE) 250 MG/5ML solution 125 mg (has no administration in time range)  Isosource 1.5 Cal LIQD 237 mL (has no administration in time range)  0.9 %  sodium chloride infusion (Manually program via Guardrails IV Fluids) ( Intravenous New Bag/Given 07/13/22 1122)    ED Course/ Medical Decision Making/ A&P                             Medical Decision Making Amount and/or Complexity of Data Reviewed Labs: ordered. Radiology: ordered.  Risk Prescription drug management. Decision regarding hospitalization.   This patient presents to the ED for concern of anemia, this involves an extensive number of treatment options, and is a complaint that carries with it a high risk of complications and morbidity.  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  The pertinent results include:  cbc with hgb 5.6 (hgb 11.3 on 05/26/22); cmp nl   Imaging Studies ordered:  I ordered imaging studies including cxr  I independently visualized and interpreted imaging which showed  Linear subsegmental atelectasis LEFT lower lobe.    Aortic Atherosclerosis (ICD10-I70.0).   I agree with the radiologist interpretation   Cardiac Monitoring:  The patient was maintained on a cardiac monitor.  I personally viewed and interpreted the cardiac monitored which showed an underlying rhythm of: nsr   Medicines ordered and prescription drug management:  I ordered medication including  transfusion  for anemia  Reevaluation of the patient after these medicines showed that the patient improved I have reviewed the patients home medicines and have made adjustments as needed   Critical Interventions:  transfusion   Consultations Obtained:  I requested consultation with the hospitalist (Dr. Roger Shelter),  and discussed lab and imaging findings as well as pertinent plan - he will admit.  He asks that I call obgyn. I spoke with Dr. Elonda Husky (obgyn) who said he will have Dr. Nelda Marseille see her in the morning.    Problem List / ED Course:  Symptomatic anemia:  unclear source.  Stool is guaiac neg.  She does have a hx of vaginal bleeding.  However, the daughters have not been told if she's had any recent bleeding.  No vaginal bleeding now.  She has not yet had an endometrial biopsy.  Daughters consent for transfusion.   Reevaluation:  After the interventions noted above, I reevaluated the patient and found that they have :improved   Social Determinants of Health:  Lives in snf   Dispostion:  After consideration of the diagnostic results and the patients response to treatment, I feel that the patent would benefit from admission.    CRITICAL CARE Performed by: Isla Pence   Total critical care time: 30 minutes  Critical care time was exclusive of separately billable procedures and treating other patients.  Critical care was necessary to treat or prevent imminent or life-threatening deterioration.  Critical care was time spent personally by me on the following activities: development of treatment plan with patient and/or surrogate as well as nursing, discussions with consultants, evaluation of patient's response to treatment, examination of patient, obtaining history from patient or surrogate, ordering and performing treatments and interventions, ordering and review of laboratory studies, ordering and review of  radiographic studies, pulse oximetry and re-evaluation of  patient's condition.         Final Clinical Impression(s) / ED Diagnoses Final diagnoses:  Symptomatic anemia  DUB (dysfunctional uterine bleeding)    Rx / DC Orders ED Discharge Orders     None         Isla Pence, MD 07/13/22 1304

## 2022-07-13 NOTE — Consult Note (Signed)
Consultation Note Date: 07/13/2022   Patient Name: Denise Savage  DOB: 04/23/1961  MRN: [id number]  Age / Sex: 62 y.o., female  PCP: Kathyrn Drown, MD Referring Physician: Deatra James, MD  Reason for Consultation:  Urgent consult for goals of care, CODE STATUS  HPI/Patient Profile: 62 y.o. 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 Hemoglobin in ED was 5.9. CT CAP and Head are pending. Ob/gyn consulted. Fecal occult negative. Palliative medicine consulted for above.    Primary Decision Maker HCPOA - daughter - Denise Savage- per her brother's and her report she is designated HCPOA  Discussion: I have reviewed medical records including [facility], progress notes from this and prior admissions, labs and imaging, discussed with RN.  On evaluation patient is nonverbal. Appears to make attempts to communicate and then cries out in frustration vs pain. Makes some attempts to follow commands (ie blinking, very soft grip).   I called patient's sonElberta Savage. Per Denise Savage patient's daughter, Denise Savage is HCPOA and he preferred that I call her.   I was able to reach Denise Savage who is currently in the hospital herself, but able to have discussion regarding her Mom. Denise Savage confirms she is HCPOA but doesn't have access to copy at this time.   I introduced Palliative Medicine as specialized medical care for people living with serious illness. It focuses on providing relief from the symptoms and stress of a serious illness. The goal is to improve quality of life for both the patient and the family.  We discussed a brief life  review of the patient. Denise Savage was very healthy prior to her anuerysm in January 2022. She suffered from intraoperative rupture while undergoing coiling resulting in her current deficits.  She initially was trach/vent dependent, was sent to a [place] facility and eventually able to be decannulated.   Prior to this admission she was aphasic, but could communicate her needs at times- her daughter notes she would sing and clap when friends from her church visited.  She also becomes very sleepy and minimally interactive when she is ill or when she has received a dose of her lorazepam in the last day.    We discussed patient's current illness and what it means in the larger context of patient's on-going co-morbidities.  Natural disease trajectory and expectations at EOL were discussed.  I attempted to elicit values and goals of care important to the patient.    Advance directives, goals of care, and concepts specific to code status were discussed.  Encouraged patient/family to consider DNR/DNI status understanding evidenced based poor outcomes in similar hospitalized patients, as the cause of the arrest is likely associated with chronic/terminal disease rather than a reversible acute cardio-pulmonary event. Denise Savage understands the concept but chooses full code for  now. I encouraged her to discuss with her family due to my concerns that if patient is able to resuscitated after having died, she will be returned to a state that is worse than she is living now.   Denise Savage currently wants to pursue full code, full workup for bleeding and full scope interventions with goal of returning to nursing center. She also requests some additional PT for patient.   Discussed with patient/family the importance of continued conversation with family and the medical providers regarding overall plan of care and treatment options, ensuring decisions are within the context of the patient's values and GOCs.    SUMMARY OF  RECOMMENDATIONS -Full code, full scope interventions -GOC for hospitalization are to figure out cause of bleeding and pursue interventions to reverse with plan to return to nursing facility -PMT will continue to follow for workup and readdress GOC as needed  -I called [facility] and was unable to reach anyone who could provide a copy of HCPOA document- will request assistance from [place] requested additional therapy with PT- we discussed that it is unlikely additional therapy will help her improve at this point given her poor cognitive state and inability to follow commands- however, Denise Savage feels that patient can comprehend what is going on and follow commands- will obtain SLP for cognitive eval to determine cognitive level and see if any alternate forms of communication are possible given her severe aphasia   Code Status/Advance Care Planning: Full code   Prognosis:   Unable to determine  Discharge Planning: To Be Determined  Primary Diagnoses: Present on Admission:  Severe anemia  Aneurysmal subarachnoid hemorrhage (HCC)  ICH (intracerebral hemorrhage) ([place])  Pulmonary embolus ([place])  Thickened endometrium   Review of Systems  Unable to perform ROS   Physical Exam Vitals and nursing note reviewed.  Constitutional:      Appearance: She is ill-appearing.  HENT:     Mouth/Throat:     Comments: Uncontrolled secretions from R side of mouth Cardiovascular:     Rate and Rhythm: Normal rate.  Pulmonary:     Effort: Pulmonary effort is normal.  Neurological:     Comments: Aphasic     Vital Signs: BP 96/79   Pulse 92   Temp 99.4 F (37.4 C) (Axillary)   Resp 17   SpO2 97%  Pain Scale: PAINAD       SpO2: SpO2: 97 % O2 Device:SpO2: 97 % O2 Flow Rate: .   IO: Intake/output summary:  Intake/Output Summary (Last 24 hours) at 07/13/2022 1516 Last data filed at 07/13/2022 1420 Gross per 24 hour  Intake 354 ml  Output --  Net 354 ml    LBM:   Baseline Weight:    Most recent weight:         Thank you for this consult. Palliative medicine will continue to follow and assist as needed.  Time Total:  90 minutes Greater than 50%  of this time was spent counseling and coordinating care related to the above assessment and plan.  Signed by: Mariana Kaufman, AGNP-C Palliative Medicine    Please contact Palliative Medicine Team phone at [phone] for questions and concerns.  For individual provider: See Shea Evans

## 2022-07-13 NOTE — ED Triage Notes (Addendum)
BIB EMS from [facility] for reports of low Hgb. Labs sent from facility read 6.0. EMS reports they were not  told of any complaints or problems. Patient is non-verbal. Has feeding tube. Currently takes Eliquis for A-fib. MD in room at time of triage for MSE.

## 2022-07-13 NOTE — ED Notes (Signed)
Spoke with Linton Rump at [facility] who states that they are willing to being Nuedexta to ED for use while patient is in hospital. Pharmacy notified.

## 2022-07-13 NOTE — Assessment & Plan Note (Signed)
-  History of SAH, Stable as above

## 2022-07-13 NOTE — Hospital Course (Signed)
Denise Savage is a 62 year old female from nursing home, [facility], contracted with tube feeds.  with of history of hemorrhagic stroke, ? 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  ? Endometrial hyperplasia -have been passing blood clots last month 05/2022  Presenting today from nursing home; [name] noting for low hemoglobin, Hemoglobin at the facility was 6, in ED  Per daughter it seems that patient has had history of vaginal bleeding, passing clots in December, was supposed to see GYN was planning for [facility] in December which was canceled.  ED evaluation: Blood pressure 108/66, pulse 84, temperature 98.8 F (37.1 C), temperature source Axillary, resp. rate 17, SpO2 97 %.  Hemoglobin 5.6, hematocrit 21.7, INR 1.3, Glucose 106, albumin 2.9, AST 13,  Normal sinus rhythm QT interval 442, QTc 538,  Reports negative tarry black or bloody stool, no visible blood in rectal or vaginal vault.  EDP to request GYN evaluation Ask patient to be admitted for blood transfusion

## 2022-07-13 NOTE — Assessment & Plan Note (Signed)
-  Due to history of ICH, SAH, currently stable

## 2022-07-13 NOTE — Assessment & Plan Note (Addendum)
-   History of stroke with stop arachnoid aneurysm hemorrhage, -History of craniotomy -History of ? pulmonary embolism -History reveals that patient has been on Eliquis 5 mg twice a day Due to severe anemia I will be on HOLD --pending to be discontinued for now (Due to likely endometrial bleeding, history of ICH) -CT of the head obtained, within normal limits chronic encephalomalacia, negative for any acute bleeding

## 2022-07-13 NOTE — Assessment & Plan Note (Addendum)
-  Requested EDP for GYN consult and evaluation As it may be contributing to current anemia  -Per daughter patient has been having vaginal bleeding, passing blood clots back in December 2023 was scheduled for GYN evaluation D&C in December which was canceled  -Subsequently patient has had no follow-up

## 2022-07-13 NOTE — ED Notes (Addendum)
Lab aware that blood transfusion complete and need for H&H redraw before third unit per MD instructions. One hour wait time for re-draw. Hospitalist states that she will likely need two more units once H&H has resulted. Lab states that they will go ahead and process one unit pending lab results.

## 2022-07-13 NOTE — ED Notes (Signed)
Requested clarification from Dr. Roger Shelter regarding duplication of orders and units of blood to be transfused. States that he would like a total of 3 units transfused with repeat H&H after second unit.

## 2022-07-13 NOTE — Assessment & Plan Note (Signed)
-  History of PE, no signs of respiratory distress -On Eliquis-was holding due to severe anemia - Discontinuation of Eliquis for now Eliquis discontinued -because of discontinuation of medication has been discussed with family in detail expressed understanding

## 2022-07-13 NOTE — ED Notes (Signed)
Daughter reports that patient had abnormal vaginal bleeding three weeks ago with large clots visible. States that she was supposed to have biopsy completed but was unable to do so.

## 2022-07-13 NOTE — Assessment & Plan Note (Signed)
-  Nonverbal severe dysphagia, PEG tube in place -Continue tube feeds, -Medications per PEG tube - NPO - Chronic -Continue oral care

## 2022-07-13 NOTE — Consult Note (Addendum)
   OB/GYN Telephone Consult  07/13/2021  Denise Savage is a 62 y.o. [phone] postmenpausal female who was admitted due to anemia.  Pt is known to me as I had previously seen in her the office for postmenopausal bleeding back in September.  Initially, the plan was to proceed with hysteroscopy, D&C; however, after much discussion with family it was cancelled for several reasons. Concerns included overall risk of surgery and whether the results would ultimately change management options.  Additionally, the family felt that the bleeding was not that significant.  Today, I spoke with IM team who requested the consult. Pt admitted now due to low Hgb of 6, which is likely due to this chronic postmenopausal bleeding as well as her long term anti-coagulant use.  Plan is also in place to r/o other cause of anemia.  At this time, she is overall stable and IM team will plan to transfuse as needed.  Per report, she currently has no vaginal bleeding.  I then had the chance to speak with Susa Griffins, her daughter who shared that she recently had the opportunity to witness/better understand her mother's bleeding pattern.  She notes that it seems to be cyclical maybe every 3 weeks, she will have a few days of very heavy bleeding with passage of clots.  The bleeding is so intense that often they are unable to get her into a wheelchair/transport patient due to the bleeding. It seems as though she may have just completed one of these bleeding episodes, but is not currently bleeding.  I performed a chart review on the patient and reviewed available documentation.  BP (!) 114/100 Comment: Patient crying out.  Pulse 81   Temp 98.7 F (37.1 C) (Axillary)   Resp 17   SpO2 97%   Exam- performed by consulting provider   Recommendations:  -plan for Depot every 3 mos -reviewed with daughter that her mother is not an ideal surgical candidate and at this time due to worsening bleeding, our next best step is medical  management -due to patient's prior medical history and inability for po intake, Depot ideal choice -Questions/concerns were addressed and agreeable to above -moving forward, I'm hopeful that [facility] will be able to administer the Depot.  Should there be any concern, please don't hesitate to reach out to below consult line and/or our office at [place]  ~ 25 min of non face to face time was spent reviewing the chart and discussing management plan with family member  Thank you for this consult and if additional recommendations are needed please call [phone] for the OB/GYN attending on service at [place], DO Attending [place], [place] for [place], [place]

## 2022-07-13 NOTE — Assessment & Plan Note (Signed)
-  Stable as above, with holding Eliquis

## 2022-07-13 NOTE — Assessment & Plan Note (Signed)
-  Currently stable, home medications of: Vimpat, Keppra, Depakote reviewed, will be resumed per PEG tube

## 2022-07-13 NOTE — H&P (Signed)
History and Physical   Patient: Denise Savage                            PCP: Kathyrn Drown, MD                    DOB: [birth year]            DOA: 07/13/2022 JSH:[id number]             DOS: 07/13/2022, 2:24 PM  Kathyrn Drown, MD  Patient coming from:   HOME  I have personally reviewed patient's medical records, in electronic medical records, including:  [place] link, and care everywhere.    Chief Complaint:   Chief Complaint  Patient presents with   Abnormal Lab    History of present illness:    Denise Savage is a 62 year old female from nursing home, [facility], contracted with tube feeds.  with of history of hemorrhagic stroke, ? 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  ? Endometrial hyperplasia -have been passing blood clots last month 05/2022  Presenting today from nursing home; [facility] noting for low hemoglobin, Hemoglobin at the facility was 6, in ED  Per daughter it seems that patient has had history of vaginal bleeding, passing clots in December, was supposed to see GYN was planning for [facility] in December which was canceled.  ED evaluation: Blood pressure 108/66, pulse 84, temperature 98.8 F (37.1 C), temperature source Axillary, resp. rate 17, SpO2 97 %.  Hemoglobin 5.6, hematocrit 21.7, INR 1.3, Glucose 106, albumin 2.9, AST 13,  Normal sinus rhythm QT interval 442, QTc 538,  Reports negative tarry black or bloody stool, no visible blood in rectal or vaginal vault.  EDP to request GYN evaluation Ask patient to be admitted for blood transfusion    Patient Denies having: Fever, Chills, Cough, SOB, Chest Pain, Abd pain, N/V/D, headache, dizziness, lightheadedness,  Dysuria, Joint pain, rash, open wounds Nonverbal-contracted at baseline (able to mobilize left arm only with no  coordination)    Review of Systems: As per HPI, otherwise 10 point review of systems were negative.   ----------------------------------------------------------------------------------------------------------------------  Allergies  Allergen Reactions   Hydrocodone Nausea Only    Home MEDs:  Prior to Admission medications   Medication Sig Start Date End Date Taking? Authorizing Provider  acetaminophen (TYLENOL) 325 MG tablet Place 650 mg into feeding tube every 6 (six) hours as needed (mild pain).   Yes [provider]  Dextromethorphan-quiNIDine (NUEDEXTA) 20-10 MG capsule 1 capsule every 12 (twelve) hours. Via G-Tube (0900 & 2100)   Yes [provider]  ELIQUIS 5 MG TABS tablet Place 1 tablet (5 mg total) into feeding tube 2 (two) times daily. (0900 & 2100) Patient taking differently: Place 5 mg into feeding tube 2 (two) times daily. (0800 & 2000) 03/28/21  Yes Consuella Lose, MD  escitalopram (LEXAPRO) 20 MG tablet Place 20 mg into feeding tube in the morning. (0900)   Yes [provider]  glycopyrrolate (ROBINUL) 1 MG tablet Take 1 mg by mouth every 8 (eight) hours as needed (increased secretions).   Yes [provider]  lacosamide (VIMPAT) 10 MG/ML oral solution Take 10 mLs (100 mg total) by mouth 2 (two) times daily. (0900 & 1700) 03/26/21  Yes Consuella Lose, MD  levETIRAcetam (KEPPRA) 1000 MG tablet Place 1,000 mg into feeding tube 2 (two) times daily. (0900 & 2100)   Yes [provider]  LORazepam (ATIVAN) 1 MG tablet Place 1 tablet (1 mg total) into feeding tube every 8 (eight) hours as needed for anxiety. (0000, 0800 & 1600) Patient taking differently: Place 1 mg into feeding tube in the morning, at noon, and at bedtime. (0600, 1400 & 2100) 03/26/21  Yes Consuella Lose, MD  Melatonin 10 MG TABS Take 10 mg by mouth at bedtime. (2100)   Yes [provider]  Nutritional Supplements (ISOSOURCE 1.5 CAL) LIQD Place 237  mLs into feeding tube 5 (five) times daily. (0600, 1000, 1400, 1800 & 2200)   Yes [provider]  omeprazole (KONVOMEP) 2 mg/mL SUSP oral suspension Place 20 mg into feeding tube daily.   Yes [provider]  traZODone (DESYREL) 50 MG tablet Take 75 mg by mouth at bedtime. (2200) 02/21/21  Yes [provider]  valproic acid (DEPAKENE) 250 MG/5ML solution Place 125 mg into feeding tube 2 (two) times daily. (0900 & 2100)   Yes [provider]  Water For Irrigation, Sterile (FREE WATER) SOLN Place 30-240 mLs into feeding tube See admin instructions. Flush feeding tube with 240 mls of water twice daily between feedings, flush with 60 ml of water before and after initiating feedings &  Flush peg tube with 30 cc of water before and after medication administration   Yes [provider]    PRN MEDs: sodium chloride, acetaminophen, bisacodyl, free water, glycopyrrolate, hydrALAZINE, HYDROmorphone (DILAUDID) injection, ipratropium, levalbuterol, LORazepam, ondansetron **OR** ondansetron (ZOFRAN) IV, oxyCODONE, senna-docusate, sodium chloride flush, sodium phosphate, traZODone  Past Medical History:  Diagnosis Date   Abdominal hernia    Anxiety    Depression    Dysphagia    G tube feedings (HCC)    G tube feedings (HCC)    Gastrostomy status (HCC)    GERD (gastroesophageal reflux disease)    Hemiplegia and hemiparesis following cerebral infarction affecting right dominant side ([place])    Non-verbal learning disorder    Pulmonary embolus ([place])    Seizure ([place])    Seizures ([place])    Stroke [facility])    Wheelchair dependent     Past Surgical History:  Procedure Laterality Date   CRANIOPLASTY Left 03/23/2021   Procedure: CRANIOPLASTY, HARVEST ABDOMINAL BONE FLAP;  Surgeon: Consuella Lose, MD;  Location: [place];  Service: Neurosurgery;  Laterality: Left;   CRANIOTOMY Left 06/19/2020   Procedure: CRANIOTOMY HEMATOMA EVACUATION SUBDURAL;  Surgeon: Judith Part, MD;  Location: [place];  Service: Neurosurgery;  Laterality: Left;   ESOPHAGOGASTRODUODENOSCOPY N/A 07/01/2020   Procedure: ESOPHAGOGASTRODUODENOSCOPY (EGD);  Surgeon: Jesusita Oka, MD;  [facility] ENDOSCOPY;  Service: General;  Laterality: N/A;   GASTROSTOMY  01/27/2022   IR ANGIO INTRA EXTRACRAN SEL INTERNAL CAROTID BILAT MOD SED  06/16/2020   IR ANGIO VERTEBRAL SEL VERTEBRAL UNI R MOD SED  06/16/2020   IR ANGIOGRAM FOLLOW UP STUDY  06/16/2020   IR ANGIOGRAM FOLLOW UP STUDY  06/16/2020   IR ANGIOGRAM FOLLOW UP STUDY  06/16/2020   IR CT HEAD LTD  06/16/2020   IR NEURO EACH ADD'L AFTER BASIC UNI RIGHT (MS)  06/16/2020   IR PATIENT EVAL TECH 0-60 MINS  01/24/2022   IR RADIOLOGIST EVAL & MGMT  01/24/2022   IR TRANSCATH/EMBOLIZ  06/16/2020   IR US GUIDE VASC ACCESS RIGHT  06/16/2020   PEG PLACEMENT N/A 07/01/2020  Procedure: PERCUTANEOUS ENDOSCOPIC GASTROSTOMY (PEG) PLACEMENT;  Surgeon: Diamantina Monks, MD;  Location: [facility] ENDOSCOPY;  Service: General;  Laterality: N/A;   RADIOLOGY WITH ANESTHESIA N/A 06/16/2020   Procedure: IR WITH ANESTHESIA;  Surgeon: Lisbeth Renshaw, MD;  Location: [facility] OR;  Service: Radiology;  Laterality: N/A;   TUBAL LIGATION     VENTRAL HERNIA REPAIR N/A 12/29/2014   Procedure: LAPAROSCOPIC VENTRAL HERNIA WITH MESH;  Surgeon: Franky Macho, MD;  Location: [facility] ORS;  Service: General;  Laterality: N/A;     reports that she has never smoked. She has never used smokeless tobacco. She reports that she does not drink alcohol and does not use drugs.   Family History  Problem Relation Age of Onset   Pancreatitis Neg Hx     Physical Exam:   Vitals:   07/13/22 1100 07/13/22 1117 07/13/22 1133 07/13/22 1404  BP: 110/80 110/80 108/66 97/69  Pulse: 87 87 84 80  Resp: (!) 22 15 17 17   Temp:  98.8 F (37.1 C) 98.8 F (37.1 C) 98.1 F (36.7 C)  TempSrc:  Axillary Axillary Axillary  SpO2: 92% 97% 97% 97%   Constitutional: NAD, calm, comfortable Eyes: PERRL, lids and  conjunctivae normal ENMT: Mucous membranes are moist. Posterior pharynx clear of any exudate or lesions.Normal dentition.  Neck: normal, supple, no masses, no thyromegaly Respiratory: clear to auscultation bilaterally, no wheezing, no crackles. Normal respiratory effort. No accessory muscle use.  Cardiovascular: Regular rate and rhythm, no murmurs / rubs / gallops. No extremity edema. 2+ pedal pulses. No carotid bruits.  Abdomen: Feeding tube-PEG tube in place, functional no tenderness, no masses palpated. No hepatosplenomegaly. Bowel sounds positive.  Musculoskeletal: Severe generalized weaknesses only able to mobilize left arm with no coordination no clubbing / cyanosis.  Neurologic: Nonverbal, diffuse loss of sensory and motor, contracted (able to mobilize left arm only with no coordination) Skin: no rashes, lesions, ulcers. No induration Decubitus/ulcers:  Wounds: per nursing documentation         Labs on admission:    I have personally reviewed following labs and imaging studies  CBC: Recent Labs  Lab 07/13/22 0954  WBC 4.2  NEUTROABS 2.1  HGB 5.6*  HCT 21.7*  MCV 75.6*  PLT 283   Basic Metabolic Panel: Recent Labs  Lab 07/13/22 0954  NA 139  K 4.4  CL 104  CO2 28  GLUCOSE 106*  BUN 17  CREATININE 0.78  CALCIUM 10.2   GFR: CrCl cannot be calculated (Unknown ideal weight.). Liver Function Tests: Recent Labs  Lab 07/13/22 0954  AST 13*  ALT 7  ALKPHOS 78  BILITOT 0.1*  PROT 7.1  ALBUMIN 2.9*   No results for input(s): "LIPASE", "AMYLASE" in the last 168 hours. No results for input(s): "AMMONIA" in the last 168 hours. Coagulation Profile: Recent Labs  Lab 07/13/22 0954  INR 1.3*    Urine analysis:    Component Value Date/Time   COLORURINE YELLOW 07/13/2022 1130   APPEARANCEUR CLOUDY (A) 07/13/2022 1130   LABSPEC 1.017 07/13/2022 1130   PHURINE 7.0 07/13/2022 1130   GLUCOSEU NEGATIVE 07/13/2022 1130   HGBUR SMALL (A) 07/13/2022 1130    BILIRUBINUR NEGATIVE 07/13/2022 1130   KETONESUR NEGATIVE 07/13/2022 1130   PROTEINUR NEGATIVE 07/13/2022 1130   NITRITE NEGATIVE 07/13/2022 1130   LEUKOCYTESUR MODERATE (A) 07/13/2022 1130    Last A1C:  Lab Results  Component Value Date   HGBA1C 5.6 06/17/2020     Radiologic Exams on Admission:   DG Chest  Portable 1 View  Result Date: 07/13/2022 CLINICAL DATA:  Anemia, abnormal vaginal bleeding 3 weeks ago with large clots EXAM: PORTABLE CHEST 1 VIEW COMPARISON:  Portable exam 1101 hours compared to 01/21/2021 FINDINGS: Upper normal heart size. Mediastinal contours and pulmonary vascularity normal. Atherosclerotic calcification aorta. Linear subsegmental atelectasis LEFT lower lobe. Remaining lungs clear. No acute infiltrate, pleural effusion, or pneumothorax. Bones demineralized. IMPRESSION: Linear subsegmental atelectasis LEFT lower lobe. Aortic Atherosclerosis (ICD10-I70.0). Electronically Signed   By: Ulyses Southward M.D.   On: 07/13/2022 11:15    EKG:   Independently reviewed.  Orders placed or performed during the hospital encounter of 07/13/22   EKG 12-Lead   EKG 12-Lead   ED EKG   ED EKG   EKG 12-Lead   ---------------------------------------------------------------------------------------------------------------------------------------    Assessment / Plan:   Principal Problem:   Severe anemia Active Problems:   Dysphagia   ICH (intracerebral hemorrhage) (HCC)   Seizures (HCC)   Pulmonary embolus (HCC)   SAH (subarachnoid hemorrhage) (HCC)   Aneurysmal subarachnoid hemorrhage (HCC)   Thickened endometrium   Status post craniectomy   Assessment and Plan: * Severe anemia - Acute on chronic anemia -Per ED evaluation no active rectal or vaginal bleed  -History of thickened endometrium-plan as outpatient for biopsy     Latest Ref Rng & Units 07/13/2022    9:54 AM 05/26/2022   11:32 AM 03/23/2021    8:20 AM  CBC  WBC 4.0 - 10.5 K/uL 4.2  4.6  6.4    Hemoglobin 12.0 - 15.0 g/dL 5.6  70.3  50.0   Hematocrit 36.0 - 46.0 % 21.7  37.5  39.0   Platelets 150 - 400 K/uL 283  ACLMP  251    -Pursuing with transfusing 3U PRBC -Attaining iron levels, B12 folate, haptoglobin, Hemoccult  -Discussed with daughter on the phone Ms. Wynelle Cleveland [phone] - who agreed with blood transfusion-  Agreed to discuss with OB/GYN Dr. Charlotta Newton  Appreciate further evaluation recommendations  Dysphagia - Nonverbal severe dysphagia, PEG tube in place -Continue tube feeds, -Medications per PEG tube - NPO - Chronic -Continue oral care  Pulmonary embolus (HCC) - History of PE, no signs of respiratory distress -On Eliquis-holding due to severe anemia  Seizures (HCC) - Currently stable, home medications of: Vimpat, Keppra, Depakote reviewed, will be resumed per PEG tube  ICH (intracerebral hemorrhage) (HCC) - Stable as above, with holding Eliquis  Aneurysmal subarachnoid hemorrhage (HCC) - History of stroke with stop arachnoid aneurysm hemorrhage, -History of craniotomy -History of pulmonary embolism -History reveals that patient has been on Eliquis 5 mg twice a day Due to severe anemia I will be on HOLD  -Obtaining CT of the head without contrast  SAH (subarachnoid hemorrhage) (HCC) - History of SAH, Stable as above  Status post craniectomy - Due to history of ICH, SAH, currently stable  Thickened endometrium - Requested EDP for GYN consult and evaluation As it may be contributing to current anemia  -Per daughter patient has been having vaginal bleeding, passing blood clots back in December 2023 was scheduled for GYN evaluation D&C in December which was canceled  -Subsequently patient has had no follow-up    Consults called:  GYN  -------------------------------------------------------------------------------------------------------------------------------------------- DVT prophylaxis:  Place and maintain sequential compression device  Start: 07/13/22 1135 TED hose Start: 07/13/22 1130 SCDs Start: 07/13/22 1130   Code Status:   Code Status: Full Code   Admission status: Patient will be admitted as Inpatient, with a greater than 2 midnight length  of stay. Level of care: Stepdown   Family Communication:  none at bedside  (The above findings and plan of care has been discussed with patient in detail, the patient expressed understanding and agreement of above plan)  --------------------------------------------------------------------------------------------------------------------------------------------------  Disposition Plan: >3 days Status is: Inpatient Remains inpatient appropriate because: Fusion, evaluation with GYN for possible intervention  ----------------------------------------------------------------------------------------------------------------------------------------------------  Time spent: > than  55  Min.  Was spent seeing and evaluating the patient, reviewing all medical records, drawn plan of care.  SIGNED: Deatra James, MD, FHM. FAAFP. [place] [facility], Pager  (Please use [email or web address] to page/ or secure chat through epic) If 7PM-7AM, please contact night-coverage [email or web address],  07/13/2022, 2:24 PM

## 2022-07-13 NOTE — Assessment & Plan Note (Addendum)
-  Acute on chronic anemia -Per ED evaluation no active rectal or vaginal bleed  -History of thickened endometrium-plan as outpatient for biopsy     Latest Ref Rng & Units 07/14/2022    4:17 AM 07/13/2022   11:10 PM 07/13/2022    6:05 PM  CBC  WBC 4.0 - 10.5 K/uL 6.0     Hemoglobin 12.0 - 15.0 g/dL 8.4  8.5  9.2   Hematocrit 36.0 - 46.0 % 29.3  29.0  31.5   Platelets 150 - 400 K/uL 248      -Status post 3U PRBC   -Discussed with daughter on the phone Ms. Raina Mina [phone] - who agreed with blood transfusion-  Discussed with OB/GYN Dr. Nelda Marseille  She also discussed the pros and cons of intervention with patient and daughter, including but holding Eliquis Patient does not candidate for any intervention -Recommended Depo-Provera injection every 3 months first injection was given July 13, 2022 (this admission)

## 2022-07-13 NOTE — ED Notes (Addendum)
Patient is NPO per family. MD made aware.

## 2022-07-14 ENCOUNTER — Encounter (HOSPITAL_COMMUNITY): Payer: Self-pay | Admitting: Family Medicine

## 2022-07-14 DIAGNOSIS — D649 Anemia, unspecified: Secondary | ICD-10-CM | POA: Diagnosis not present

## 2022-07-14 LAB — BASIC METABOLIC PANEL
Anion gap: 5 (ref 5–15)
BUN: 15 mg/dL (ref 8–23)
CO2: 27 mmol/L (ref 22–32)
Calcium: 9.9 mg/dL (ref 8.9–10.3)
Chloride: 104 mmol/L (ref 98–111)
Creatinine, Ser: 0.63 mg/dL (ref 0.44–1.00)
GFR, Estimated: >60 mL/min (ref >60)
Glucose, Bld: 85 mg/dL (ref 70–99)
Potassium: 3.9 mmol/L (ref 3.5–5.1)
Sodium: 136 mmol/L (ref 135–145)

## 2022-07-14 LAB — CBC
HCT: 29.3 % — ABNORMAL LOW (ref 36.0–46.0)
Hemoglobin: 8.4 g/dL — ABNORMAL LOW (ref 12.0–15.0)
MCH: 22.4 pg — ABNORMAL LOW (ref 26.0–34.0)
MCHC: 28.7 g/dL — ABNORMAL LOW (ref 30.0–36.0)
MCV: 78.1 fL — ABNORMAL LOW (ref 80.0–100.0)
Platelets: 248 10*3/uL (ref 150–400)
RBC: 3.75 MIL/uL — ABNORMAL LOW (ref 3.87–5.11)
RDW: 18.4 % — ABNORMAL HIGH (ref 11.5–15.5)
WBC: 6 10*3/uL (ref 4.0–10.5)
nRBC: 0.7 % — ABNORMAL HIGH (ref 0.0–0.2)

## 2022-07-14 LAB — APTT: aPTT: 31 seconds (ref 24–36)

## 2022-07-14 LAB — PROTIME-INR
INR: 1.1 (ref 0.8–1.2)
Prothrombin Time: 14.3 seconds (ref 11.4–15.2)

## 2022-07-14 LAB — MRSA NEXT GEN BY PCR, NASAL: MRSA by PCR Next Gen: NOT DETECTED

## 2022-07-14 LAB — GLUCOSE, CAPILLARY: Glucose-Capillary: 110 mg/dL — ABNORMAL HIGH (ref 70–99)

## 2022-07-14 MED ORDER — ORAL CARE MOUTH RINSE: 15.0000 mL | OROMUCOSAL | 0 refills | Status: DC | PRN

## 2022-07-14 MED ORDER — ORAL CARE MOUTH RINSE: 15.0000 mL | OROMUCOSAL | Status: DC | PRN

## 2022-07-14 NOTE — NC FL2 (Signed)
[place] LEVEL OF CARE FORM     IDENTIFICATION  Patient Name: Denise Savage Birthdate: [birth year] Sex: female Admission Date (Current Location): 07/13/2022  [place] and [place] Number:  [place] [id number] [place] and Address:  [facility] [address] [place]      Provider Number: [id number]  Attending Physician Name and Address:  Deatra James, MD  Relative Name and Phone Number:  Dilynn, [place] (Daughter) [phone]    Current Level of Care: Hospital Recommended Level of Care: [place] Prior Approval Number:    Date Approved/Denied:   PASRR Number:    Discharge Plan: SNF    Current Diagnoses: Patient Active Problem List   Diagnosis Date Noted   Severe anemia 07/13/2022   Skull 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/23/2021   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   Seizures ([place])    Pulmonary embolus (HCC)    Dysphagia    Prolonged QT interval    Thickened endometrium    ICH (intracerebral hemorrhage) ([place]) 06/18/2020   Aneurysmal subarachnoid hemorrhage ([place]) 06/16/2020   SAH (subarachnoid hemorrhage) ([place]) 06/15/2020    Orientation RESPIRATION BLADDER Height & Weight        Normal Incontinent Weight: 192 lb 3.9 oz (87.2 kg) Height:  5\' 7"  (170.2 cm)  BEHAVIORAL SYMPTOMS/MOOD NEUROLOGICAL BOWEL NUTRITION STATUS      Incontinent Diet (see d/c summary)  AMBULATORY STATUS COMMUNICATION OF NEEDS Skin   Total Care Verbally Normal                       Personal Care Assistance Level of Assistance  Total care       Total Care Assistance: Maximum assistance   Functional Limitations Info  Sight, Hearing, Speech Sight Info: Adequate Hearing Info: Adequate Speech Info: Impaired (non-verbal)    SPECIAL CARE FACTORS FREQUENCY                       Contractures Contractures Info: Present    Additional Factors Info  Code Status, Allergies Code Status Info: Full code Allergies Info: hydrocodone            Current Medications (07/14/2022):  This is the current hospital active medication list Current Facility-Administered Medications  Medication Dose Route Frequency Provider Last Rate Last Admin   acetaminophen (TYLENOL) tablet 650 mg  650 mg Per Tube Q6H PRN Skipper Cliche A, MD   650 mg at 07/13/22 1711   bisacodyl (DULCOLAX) EC tablet 5 mg  5 mg Oral Daily PRN Shahmehdi, Seyed A, MD       Dextromethorphan-quiNIDine (NUEDEXTA) 20-10 MG per capsule 1 capsule  1 capsule Oral Q12H Shahmehdi, Seyed A, MD       escitalopram (LEXAPRO) tablet 20 mg  20 mg Per Tube q AM Shahmehdi, Seyed A, MD   20 mg at 07/14/22 0620   famotidine (PEPCID) tablet 20 mg  20 mg Per Tube Daily Shahmehdi, Seyed A, MD   20 mg at 07/14/22 0809   feeding supplement (OSMOLITE 1.5 CAL) liquid 237 mL  237 mL Per Tube 5 X Daily Shahmehdi, Seyed A, MD   237 mL at 07/14/22 0810   free water 30-240 mL  30-240 mL Per Tube PRN Shahmehdi, Seyed A, MD       glycopyrrolate (ROBINUL) tablet 1 mg  1 mg Oral Q8H PRN Shahmehdi, Seyed A, MD       hydrALAZINE (APRESOLINE) injection 10 mg  10 mg Intravenous Q4H PRN  Shahmehdi, Seyed A, MD       HYDROmorphone (DILAUDID) injection 0.5-1 mg  0.5-1 mg Intravenous Q2H PRN Shahmehdi, Seyed A, MD       ipratropium (ATROVENT) nebulizer solution 0.5 mg  0.5 mg Nebulization Q6H PRN Shahmehdi, Seyed A, MD       lacosamide (VIMPAT) tablet 100 mg  100 mg Per Tube BID Skipper Cliche A, MD   100 mg at 07/14/22 0809   levalbuterol (XOPENEX) nebulizer solution 0.63 mg  0.63 mg Nebulization Q6H PRN Shahmehdi, Valeria Batman, MD       levETIRAcetam (KEPPRA) tablet 1,000 mg  1,000 mg Per Tube BID Skipper Cliche A, MD   1,000 mg at 07/14/22 0809   LORazepam (ATIVAN) tablet 1 mg  1 mg Per Tube Q8H PRN Skipper Cliche A, MD   1 mg at 07/13/22 2049   melatonin tablet 9 mg  9 mg Per Tube QHS Shahmehdi, Seyed A, MD   9 mg at 07/13/22 2200   ondansetron (ZOFRAN) tablet 4 mg  4 mg Oral Q6H PRN Shahmehdi, Seyed A, MD        Or   ondansetron (ZOFRAN) injection 4 mg  4 mg Intravenous Q6H PRN Shahmehdi, Seyed A, MD       Oral care mouth rinse  15 mL Mouth Rinse PRN Zierle-Ghosh, Asia B, DO       oxyCODONE (Oxy IR/ROXICODONE) immediate release tablet 5 mg  5 mg Oral Q4H PRN Shahmehdi, Seyed A, MD   5 mg at 07/13/22 2049   senna-docusate (Senokot-S) tablet 1 tablet  1 tablet Oral QHS PRN Shahmehdi, Seyed A, MD       sodium chloride flush (NS) 0.9 % injection 3 mL  3 mL Intravenous Q12H Shahmehdi, Seyed A, MD   3 mL at 07/14/22 0811   sodium phosphate (FLEET) 7-19 GM/118ML enema 1 enema  1 enema Rectal Once PRN Shahmehdi, Seyed A, MD       traZODone (DESYREL) tablet 25 mg  25 mg Oral QHS PRN Shahmehdi, Seyed A, MD   25 mg at 07/13/22 2056   valproic acid (DEPAKENE) 250 MG/5ML solution 125 mg  125 mg Per Tube BID Skipper Cliche A, MD   125 mg at 07/14/22 0809     Discharge Medications: Please see discharge summary for a list of discharge medications.  Relevant Imaging Results:  Relevant Lab Results:   Additional Information SSN [id number]  Ihor Gully, LCSW

## 2022-07-14 NOTE — TOC Initial Note (Signed)
Transition of Care [facility]) - Initial/Assessment Note    Patient Details  Name: Denise Savage MRN: [id number] Date of Birth: 09-03-1960  Transition of Care [facility]) CM/SW Contact:    Ihor Gully, LCSW Phone Number: 07/14/2022, 9:32 AM  Clinical Narrative:                 Spoke with patient's daughter, Lorrin Goodell. Patient is LTC at [facility] Discharge clinicals sent to facility. Nurse to call report. EMS to provide transport. TOC signing off.   Expected Discharge Plan: Skilled Nursing Facility Barriers to Discharge: No Barriers Identified   Patient Goals and CMS Choice            Expected Discharge Plan and Services         Expected Discharge Date: 07/14/22                                    Prior Living Arrangements/Services     Patient language and need for interpreter reviewed:: Yes        Need for Family Participation in Patient Care: Yes (Comment) Care giver support system in place?: Yes (comment)   Criminal Activity/Legal Involvement Pertinent to Current Situation/Hospitalization: No - Comment as needed  Activities of Daily Living Home Assistive Devices/Equipment: Bathtub lift, Blood pressure cuff, CBG Meter, Eyeglasses, Enteral Feeding Supplies, Hospital bed, Shower chair with back, Wheelchair ADL Screening (condition at time of admission) Patient's cognitive ability adequate to safely complete daily activities?: No Is the patient deaf or have difficulty hearing?: No Does the patient have difficulty seeing, even when wearing glasses/contacts?: No Does the patient have difficulty concentrating, remembering, or making decisions?: Yes Patient able to express need for assistance with ADLs?: No Does the patient have difficulty dressing or bathing?: Yes Independently performs ADLs?: No Communication: Needs assistance Is this a change from baseline?: Pre-admission baseline Dressing (OT): Needs assistance Is this a change from baseline?: Pre-admission  baseline Grooming: Needs assistance Is this a change from baseline?: Pre-admission baseline Feeding: Needs assistance Is this a change from baseline?: Pre-admission baseline Bathing: Needs assistance Is this a change from baseline?: Pre-admission baseline Toileting: Needs assistance Is this a change from baseline?: Pre-admission baseline In/Out Bed: Needs assistance Is this a change from baseline?: Pre-admission baseline Walks in Home: Needs assistance Is this a change from baseline?: Pre-admission baseline Does the patient have difficulty walking or climbing stairs?: Yes Weakness of Legs: Both Weakness of Arms/Hands: Both  Permission Sought/Granted Permission sought to share information with : Family Supports    Share Information with NAME: daughter, [name]           Emotional Assessment         Alcohol / Substance Use: Not Applicable    Admission diagnosis:  DUB (dysfunctional uterine bleeding) [N93.8] Severe anemia [D64.9] Symptomatic anemia [D64.9] Patient Active Problem List   Diagnosis Date Noted   Severe anemia 07/13/2022   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  [address] [place] [address] Phone: [phone] Fax: [phone]  [place] [place] [place]. [place] [address] Phone: [phone] Fax: [phone]     Social Determinants of Health ([place]) Social History: [place]: No Food Insecurity (07/14/2022)  Housing: Low  Risk  (07/14/2022)  Transportation Needs: No Transportation Needs (07/14/2022)  Utilities: Not At Risk (07/14/2022)  Tobacco Use: Low Risk  (07/14/2022)   SDOH Interventions:     Readmission Risk Interventions    09/08/2020    8:04 AM  Readmission Risk Prevention Plan  Transportation Screening Complete  PCP or Specialist Appt within 5-7 Days Complete  Home Care Screening Complete  Medication Review (RN CM) Complete

## 2022-07-14 NOTE — Discharge Summary (Addendum)
Physician Discharge Summary   Patient: Denise Savage MRN: [id number] DOB: 09/19/1960  Admit date:     07/13/2022  Discharge date: 07/14/22  Discharge Physician: Kendell Bane   PCP: Babs Sciara, MD   Recommendations at discharge:    Follow-up with Dr. Charlotta Newton -GYN in 1-2 weeks Labs, CBC q. Weekly--results to PCP and OB/GYN Dr. Charlotta Newton Status post blood transfusion 2U PRBC CT abdomen pelvis:-Endometrial hyperplasia, follow-up with GYN as above Repeat CT of the head on this admission no changes Discussed with daughter-family members regarding discontinuation of blood thinners (Eliquis) due to persistent anemia, possible vaginal uterine bleeding-history of intracranial hemorrhage Depo-Provera injection every 3 months first injection was given July 13, 2022 (this admission) Follow with the palliative care as an outpatient  Discharge Diagnoses: Principal Problem:   Severe anemia Active Problems:   Dysphagia   ICH (intracerebral hemorrhage) (HCC)   Seizures (HCC)   Pulmonary embolus (HCC)   SAH (subarachnoid hemorrhage) (HCC)   Aneurysmal subarachnoid hemorrhage (HCC)   Thickened endometrium   Status post craniectomy  Resolved Problems:   * No resolved hospital problems. *  Hospital Course: [name] is a 62 year old female from nursing home, [facility], contracted with tube feeds.  with of history of hemorrhagic stroke, ? 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  ? Endometrial hyperplasia -have been passing blood clots last month 05/2022  Presenting today from nursing home; [facility] noting for low hemoglobin, Hemoglobin at the facility was 6, in ED  Per daughter it seems that patient has had history of vaginal bleeding, passing clots in December, was supposed to see GYN was planning for [facility] in December which was  canceled.  ED evaluation: Blood pressure 108/66, pulse 84, temperature 98.8 F (37.1 C), temperature source Axillary, resp. rate 17, SpO2 97 %.  Hemoglobin 5.6, hematocrit 21.7, INR 1.3, Glucose 106, albumin 2.9, AST 13,  Normal sinus rhythm QT interval 442, QTc 538,  Reports negative tarry black or bloody stool, no visible blood in rectal or vaginal vault.  EDP to request GYN evaluation Ask patient to be admitted for blood transfusion   * Severe anemia - Acute on chronic anemia -Per ED evaluation no active rectal or vaginal bleed  -History of thickened endometrium-plan as outpatient for biopsy     Latest Ref Rng & Units 07/14/2022    4:17 AM 07/13/2022   11:10 PM 07/13/2022    6:05 PM  CBC  WBC 4.0 - 10.5 K/uL 6.0     Hemoglobin 12.0 - 15.0 g/dL 8.4  8.5  9.2   Hematocrit 36.0 - 46.0 % 29.3  29.0  31.5   Platelets 150 - 400 K/uL 248      -Status post 3U PRBC   -Discussed with daughter on the phone Ms. Wynelle [place] [phone] - who agreed with blood transfusion-  Discussed with OB/GYN Dr. Charlotta Newton  She also discussed the pros and cons of intervention with patient and daughter, including but holding Eliquis Patient does not candidate for any intervention -Recommended Depo-Provera injection every 3 months first injection was given July 13, 2022 (this admission)   Dysphagia - Nonverbal severe dysphagia, PEG tube in place -Continue tube feeds, -Medications per PEG tube - NPO - Chronic -Continue oral care  Pulmonary embolus (HCC) - History of PE, no signs of respiratory distress -On Eliquis-was holding due  to severe anemia - Discontinuation of Eliquis for now Eliquis discontinued -because of discontinuation of medication has been discussed with family in detail expressed understanding  Seizures ([place]) - Currently stable, home medications of: Vimpat, Keppra, Depakote reviewed, will be resumed per PEG tube  ICH (intracerebral hemorrhage) ([place]) - Stable as above, was  holding Eliquis -Discussed with OB/GYN and family, daughter and her son regarding discontinuation of Eliquis for now Eliquis discontinued -because of discontinuation of medication has been discussed with family in detail expressed understanding  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 pulmonary embolism -History reveals that patient has been on Eliquis 5 mg twice a day Due to severe anemia I will be on HOLD --pending to be discontinued for now (Due to likely endometrial bleeding, history of ICH) -CT of the head obtained, within normal limits chronic encephalomalacia, negative for any acute bleeding   SAH (subarachnoid hemorrhage) ([place]) - History of SAH, Stable as above  Status post craniectomy - Due to history of ICH, SAH, currently stable  Thickened endometrium - Requested EDP for GYN consult and evaluation As it may be contributing to current anemia  -Per daughter patient has been having vaginal bleeding, passing blood clots back in December 2023 was scheduled for GYN evaluation D&C in December which was canceled  -Subsequently patient has had no follow-up       Consultants: OBGYN Dr. Nelda Marseille  Procedures performed: Depo shot, 3U PRBC transfusion Disposition: Skilled nursing facility Diet recommendation:  NPO continue tube feeds as of before DISCHARGE MEDICATION: Allergies as of 07/14/2022       Reactions   Hydrocodone Nausea Only        Medication List     STOP taking these medications    Eliquis 5 MG Tabs tablet Generic drug: apixaban       TAKE these medications    acetaminophen 325 MG tablet Commonly known as: TYLENOL Place 650 mg into feeding tube every 6 (six) hours as needed (mild pain).   escitalopram 20 MG tablet Commonly known as: LEXAPRO Place 20 mg into feeding tube in the morning. (0900)   free water Soln Place 30-240 mLs into feeding tube  See admin instructions. Flush feeding tube with 240 mls of water twice daily between feedings, flush with 60 ml of water before and after initiating feedings &  Flush peg tube with 30 cc of water before and after medication administration   glycopyrrolate 1 MG tablet Commonly known as: ROBINUL Take 1 mg by mouth every 8 (eight) hours as needed (increased secretions).   Isosource 1.5 Cal Liqd Place 237 mLs into feeding tube 5 (five) times daily. (0600, 1000, 1400, 1800 & 2200)   Keppra 1000 MG tablet Generic drug: levETIRAcetam Place 1,000 mg into feeding tube 2 (two) times daily. (0900 & 2100)   lacosamide 10 MG/ML oral solution Commonly known as: VIMPAT Take 10 mLs (100 mg total) by mouth 2 (two) times daily. (0900 & 1700)   LORazepam 1 MG tablet Commonly known as: ATIVAN Place 1 tablet (1 mg total) into feeding tube every 8 (eight) hours as needed for anxiety. (0000, 0800 & 1600) What changed:  when to take this additional instructions   Melatonin 10 MG Tabs Take 10 mg by mouth at bedtime. (2100)   mouth rinse Liqd solution 15 mLs by Mouth Rinse route as needed (for oral care).   Nuedexta 20-10 MG capsule Generic drug: Dextromethorphan-quiNIDine 1 capsule  every 12 (twelve) hours. Via G-Tube (0900 & 2100)   omeprazole 2 mg/mL Susp oral suspension Commonly known as: KONVOMEP Place 20 mg into feeding tube daily.   traZODone 50 MG tablet Commonly known as: DESYREL Take 75 mg by mouth at bedtime. (2200)   valproic acid 250 MG/5ML solution Commonly known as: DEPAKENE Place 125 mg into feeding tube 2 (two) times daily. (0900 & 2100)        Discharge Exam: Filed Weights   07/13/22 2211 07/14/22 0356  Weight: 85.6 kg 87.2 kg        General:  Nonverbal, alert, does not follow command, Not ambulating at baseline  HEENT:  Normocephalic, PERRL, otherwise with in Normal limits   Neuro:  Limited exam significant paraplegia due to history of CVA, nonverbal, minimal  motor on the left arm,-does not respond to pain or touch stimuli  Lungs:   Clear to auscultation BL, Respirations unlabored,  No wheezes / crackles  Cardio:    S1/S2, RRR, No murmure, No Rubs or Gallops   Abdomen:  Tube in place, soft, non-tender, bowel sounds active all four quadrants, no guarding or peritoneal signs.  Muscular  skeletal:  Limited exam -global generalized weaknesses -Only able to move left arm with discoordination 2+ pulses,  symmetric, No pitting edema  Skin:  Dry, warm to touch, negative for any Rashes,  Wounds: Please see nursing documentation          Condition at discharge: fair  The results of significant diagnostics from this hospitalization (including imaging, microbiology, ancillary and laboratory) are listed below for reference.   Imaging Studies: CT HEAD WO CONTRAST (5MM)  Result Date: 07/13/2022 CLINICAL DATA:  Follow-up intracranial hemorrhage EXAM: CT HEAD WITHOUT CONTRAST TECHNIQUE: Contiguous axial images were obtained from the base of the skull through the vertex without intravenous contrast. RADIATION DOSE REDUCTION: This exam was performed according to the departmental dose-optimization program which includes automated exposure control, adjustment of the mA and/or kV according to patient size and/or use of iterative reconstruction technique. COMPARISON:  01/21/2021 FINDINGS: Brain: Redemonstrated large area of encephalomalacia left cerebral hemisphere, unchanged from the prior exam. Ex vacuo dilatation the left lateral ventricle. No evidence of acute head infarct, hemorrhage, mass, mass effect, or midline shift. Vascular: No hyperdense vessel. Embolization coils are seen in the left middle cerebral artery. Skull: Status post left hemi craniotomy, with interval replacement of the craniotomy flap. Sinuses/Orbits: Right maxillary mucous retention cyst. Other: The mastoid air cells are well aerated. IMPRESSION: No acute intracranial process. Unchanged  encephalomalacia throughout the left cerebral hemisphere. Electronically Signed   By: Merilyn Baba M.D.   On: 07/13/2022 17:27   CT ABDOMEN PELVIS WO CONTRAST  Result Date: 07/13/2022 CLINICAL DATA:  Hematuria and flank pain, initial encounter EXAM: CT ABDOMEN AND PELVIS WITHOUT CONTRAST TECHNIQUE: Multidetector CT imaging of the abdomen and pelvis was performed following the standard protocol without IV contrast. RADIATION DOSE REDUCTION: This exam was performed according to the departmental dose-optimization program which includes automated exposure control, adjustment of the mA and/or kV according to patient size and/or use of iterative reconstruction technique. COMPARISON:  01/21/2021 FINDINGS: Lower chest: No acute abnormality. Hepatobiliary: Liver is within normal limits. Gallbladder is well distended. Some dependent density is seen which may represent gallbladder sludge. No discrete calculi are noted. Pancreas: Unremarkable. No pancreatic ductal dilatation or surrounding inflammatory changes. Spleen: Normal in size without focal abnormality. Adrenals/Urinary Tract: Adrenal glands are within normal limits. Kidneys are well visualized bilaterally. Bilateral nonobstructing  calculi are noted worst on the right. Largest of these measures up to 8 mm. The ureters are within normal limits bilaterally. Bladder is well distended with multiple dependent calculi. These were not seen on the prior exam. Stomach/Bowel: The appendix is not well visualized and may have been surgically removed. No obstructive or inflammatory changes of colon are seen. The small bowel is within normal limits. Gastrostomy catheter is noted within the stomach. Vascular/Lymphatic: Aortic atherosclerosis. No enlarged abdominal or pelvic lymph nodes. Reproductive: Uterus is prominent with fluid in the endometrial canal with a and 8.5 mm thickness of the endometrium. No adnexal mass is seen. Other: No abdominal wall hernia or abnormality. No  abdominopelvic ascites. Musculoskeletal: Degenerative changes of lumbar spine are noted. No acute bony abnormality is seen. IMPRESSION: Dependent density in the gallbladder likely representing gallbladder sludge. Bilateral renal calculi without obstructive change. Multiple bladder calculi are noted as well. The size of the individual bladder calculi is difficult to determine as there are clustered together on the right. Thickening of the endometrium to 8.5 mm. In the setting of post-menopausal bleeding, endometrial sampling is indicated to exclude carcinoma. If results are benign, sonohysterogram should be considered for focal lesion work-up. (Ref: Radiological Reasoning: Algorithmic Workup of Abnormal Vaginal Bleeding with Endovaginal Sonography and Sonohysterography. AJR 2008; 353:I14-43) Electronically Signed   By: Inez Catalina M.D.   On: 07/13/2022 17:25   DG Chest Portable 1 View  Result Date: 07/13/2022 CLINICAL DATA:  Anemia, abnormal vaginal bleeding 3 weeks ago with large clots EXAM: PORTABLE CHEST 1 VIEW COMPARISON:  Portable exam 1101 hours compared to 01/21/2021 FINDINGS: Upper normal heart size. Mediastinal contours and pulmonary vascularity normal. Atherosclerotic calcification aorta. Linear subsegmental atelectasis LEFT lower lobe. Remaining lungs clear. No acute infiltrate, pleural effusion, or pneumothorax. Bones demineralized. IMPRESSION: Linear subsegmental atelectasis LEFT lower lobe. Aortic Atherosclerosis (ICD10-I70.0). Electronically Signed   By: Lavonia Dana M.D.   On: 07/13/2022 11:15    Microbiology: Results for orders placed or performed during the hospital encounter of 03/23/21  SARS Coronavirus 2 by RT PCR (hospital order, performed in [facility] hospital lab) Nasopharyngeal Nasopharyngeal Swab     Status: None   Collection Time: 03/23/21  7:04 AM   Specimen: Nasopharyngeal Swab  Result Value Ref Range Status   SARS Coronavirus 2 NEGATIVE NEGATIVE Final    Comment:  (NOTE) SARS-CoV-2 target nucleic acids are NOT DETECTED.  The SARS-CoV-2 RNA is generally detectable in upper and lower respiratory specimens during the acute phase of infection. The lowest concentration of SARS-CoV-2 viral copies this assay can detect is 250 copies / mL. A negative result does not preclude SARS-CoV-2 infection and should not be used as the sole basis for treatment or other patient management decisions.  A negative result may occur with improper specimen collection / handling, submission of specimen other than nasopharyngeal swab, presence of viral mutation(s) within the areas targeted by this assay, and inadequate number of viral copies (<250 copies / mL). A negative result must be combined with clinical observations, patient history, and epidemiological information.  Fact Sheet for Patients:   StrictlyIdeas.no  Fact Sheet for Healthcare Providers: BankingDealers.co.za  This test is not yet approved or  cleared by the [place] FDA and has been authorized for detection and/or diagnosis of SARS-CoV-2 by FDA under an Emergency Use Authorization (EUA).  This EUA will remain in effect (meaning this test can be used) for the duration of the COVID-19 declaration under Section 564(b)(1) of the Act, 21  U.S.C. section 360bbb-3(b)(1), unless the authorization is terminated or revoked sooner.  Performed at [facility], [place] [address] [place], [place] [address]   MRSA Next Gen by PCR, Nasal     Status: None   Collection Time: 03/23/21  3:55 PM   Specimen: Nasal Mucosa; Nasal Swab  Result Value Ref Range Status   MRSA by PCR Next Gen NOT DETECTED NOT DETECTED Final    Comment: (NOTE) The GeneXpert MRSA Assay (FDA approved for NASAL specimens only), is one component of a comprehensive MRSA colonization surveillance program. It is not intended to diagnose MRSA infection nor to guide or monitor treatment for MRSA  infections. Test performance is not FDA approved in patients less than 72 years old. Performed at [facility], [place] [address], [place], [place] [address]     Labs: CBC: Recent Labs  Lab 07/13/22 [phone] 07/13/22 1805 07/13/22 2310 07/14/22 0417  WBC 4.2  --   --  6.0  NEUTROABS 2.1  --   --   --   HGB 5.6* 9.2* 8.5* 8.4*  HCT 21.7* 31.5* 29.0* 29.3*  MCV 75.6*  --   --  78.1*  PLT 283  --   --  631   Basic Metabolic Panel: Recent Labs  Lab 07/13/22 0954 07/13/22 1838 07/14/22 0417  NA 139  --  136  K 4.4  --  3.9  CL 104  --  104  CO2 28  --  27  GLUCOSE 106*  --  85  BUN 17  --  15  CREATININE 0.78  --  0.63  CALCIUM 10.2  --  9.9  MG  --  2.0  --   PHOS  --  2.6  --    Liver Function Tests: Recent Labs  Lab 07/13/22 0954  AST 13*  ALT 7  ALKPHOS 78  BILITOT 0.1*  PROT 7.1  ALBUMIN 2.9*   CBG: Recent Labs  Lab 07/14/22 0756  GLUCAP 110*    Discharge time spent: greater than 30 minutes.  Signed: Deatra James, MD [facility] 07/14/2022

## 2022-07-14 NOTE — Progress Notes (Signed)
Nsg Discharge Note  Admit Date:  07/13/2022 Discharge date: 07/14/2022   BLAIRE PALOMINO to be D/C'd Home per MD order.  AVS completed.   Patient/caregiver able to verbalize understanding.  Discharge Medication: Allergies as of 07/14/2022       Reactions   Hydrocodone Nausea Only        Medication List     STOP taking these medications    Eliquis 5 MG Tabs tablet Generic drug: apixaban       TAKE these medications    acetaminophen 325 MG tablet Commonly known as: TYLENOL Place 650 mg into feeding tube every 6 (six) hours as needed (mild pain).   escitalopram 20 MG tablet Commonly known as: LEXAPRO Place 20 mg into feeding tube in the morning. (0900)   free water Soln Place 30-240 mLs into feeding tube See admin instructions. Flush feeding tube with 240 mls of water twice daily between feedings, flush with 60 ml of water before and after initiating feedings &  Flush peg tube with 30 cc of water before and after medication administration   glycopyrrolate 1 MG tablet Commonly known as: ROBINUL Take 1 mg by mouth every 8 (eight) hours as needed (increased secretions).   Isosource 1.5 Cal Liqd Place 237 mLs into feeding tube 5 (five) times daily. (0600, 1000, 1400, 1800 & 2200)   Keppra 1000 MG tablet Generic drug: levETIRAcetam Place 1,000 mg into feeding tube 2 (two) times daily. (0900 & 2100)   lacosamide 10 MG/ML oral solution Commonly known as: VIMPAT Take 10 mLs (100 mg total) by mouth 2 (two) times daily. (0900 & 1700)   LORazepam 1 MG tablet Commonly known as: ATIVAN Place 1 tablet (1 mg total) into feeding tube every 8 (eight) hours as needed for anxiety. (0000, 0800 & 1600) What changed:  when to take this additional instructions   Melatonin 10 MG Tabs Take 10 mg by mouth at bedtime. (2100)   mouth rinse Liqd solution 15 mLs by Mouth Rinse route as needed (for oral care).   Nuedexta 20-10 MG capsule Generic drug: Dextromethorphan-quiNIDine 1  capsule every 12 (twelve) hours. Via G-Tube (0900 & 2100)   omeprazole 2 mg/mL Susp oral suspension Commonly known as: KONVOMEP Place 20 mg into feeding tube daily.   traZODone 50 MG tablet Commonly known as: DESYREL Take 75 mg by mouth at bedtime. (2200)   valproic acid 250 MG/5ML solution Commonly known as: DEPAKENE Place 125 mg into feeding tube 2 (two) times daily. (0900 & 2100)        Discharge Assessment: Vitals:   07/14/22 0535 07/14/22 1033  BP: (!) 106/58 117/77  Pulse: 77 83  Resp: 18   Temp: 98.1 F (36.7 C) 98.9 F (37.2 C)  SpO2: 100% 100%   Skin clean, dry and intact without evidence of skin break down, no evidence of skin tears noted. IV catheter discontinued intact. Site without signs and symptoms of complications - no redness or edema noted at insertion site, patient denies c/o pain - only slight tenderness at site.  Dressing with slight pressure applied.  D/c Instructions-Education: Discharge instructions given to patient/family with verbalized understanding. D/c education completed with patient/family including follow up instructions, medication list, d/c activities limitations if indicated, with other d/c instructions as indicated by MD - patient able to verbalize understanding, all questions fully answered. Patient instructed to return to ED, call 911, or call MD for any changes in condition.  Patient escorted via [place], and D/C home via  private auto.  Kathie Rhodes, RN 07/14/2022 10:51 AM

## 2022-07-16 LAB — HAPTOGLOBIN: Haptoglobin: 246 mg/dL (ref 37–355)

## 2022-07-17 LAB — BPAM RBC
Blood Product Expiration Date: 202402282359
Blood Product Expiration Date: 202402282359
Blood Product Expiration Date: 202402292359
ISSUE DATE / TIME: 202401311114
ISSUE DATE / TIME: 202401311412
Unit Type and Rh: 1700
Unit Type and Rh: 1700
Unit Type and Rh: 1700

## 2022-07-17 LAB — TYPE AND SCREEN
ABO/RH(D): B POS
Antibody Screen: NEGATIVE
Unit division: 0
Unit division: 0
Unit division: 0

## 2022-07-29 ENCOUNTER — Ambulatory Visit (INDEPENDENT_AMBULATORY_CARE_PROVIDER_SITE_OTHER): Payer: Medicaid Other | Admitting: Obstetrics & Gynecology

## 2022-07-29 ENCOUNTER — Encounter: Payer: Self-pay | Admitting: Obstetrics & Gynecology

## 2022-07-29 DIAGNOSIS — I69359 Hemiplegia and hemiparesis following cerebral infarction affecting unspecified side: Secondary | ICD-10-CM | POA: Diagnosis not present

## 2022-07-29 DIAGNOSIS — N950 Postmenopausal bleeding: Secondary | ICD-10-CM

## 2022-07-29 DIAGNOSIS — R4701 Aphasia: Secondary | ICD-10-CM

## 2022-07-29 DIAGNOSIS — R9389 Abnormal findings on diagnostic imaging of other specified body structures: Secondary | ICD-10-CM | POA: Diagnosis not present

## 2022-07-29 DIAGNOSIS — I609 Nontraumatic subarachnoid hemorrhage, unspecified: Secondary | ICD-10-CM

## 2022-07-29 DIAGNOSIS — I2699 Other pulmonary embolism without acute cor pulmonale: Secondary | ICD-10-CM

## 2022-07-29 MED ORDER — MEDROXYPROGESTERONE ACETATE 150 MG/ML IM SUSP: 150.0000 mg | INTRAMUSCULAR | 4 refills | Status: DC

## 2022-07-29 NOTE — Progress Notes (Signed)
   GYN VISIT Patient name: DIVINA ABARE MRN [id number]  Date of birth: 04/10/1961 Chief Complaint:   Follow-up (bleeding)  History of Present Illness:   Denise Savage is a 62 y.o. [phone] PM female being seen today for follow up regarding postmenopausal bleeding.  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  She is unable to provide any information regarding her clinical history.   Pt received Depot during hospitalization on 1/31- she has not had any further bleeding since that time.  Due to inability to complete surgical intervention. When she went to the hospital, the report was that she was not bleeding.    []$  plan to contact facility regarding arrangements of Depot     Pertinent History Reviewed:  Reviewed past medical,surgical, social, obstetrical and family history.  Reviewed problem list, medications and allergies. Physical Assessment:  Pt wheelchair bound.  Unable to obtain blood pressure- pt refused       Physical Examination:   General appearance: alert, no acute distress  Psych: mood appropriate, normal affect  Skin: warm & dry   Cardiovascular: normal heart rate noted  Respiratory: normal respiratory effort, no distress  Extremities: no edema   Chaperone: N/A    Assessment & Plan:  1) Postmenopausal bleeding -bleeding has currently stopped -due to multiple medical co-morbidites plan to avoid surgery -will continue with Depot every 3 mos -f/u for annual exams   Reviewed with staff at [facility]- plan to continue with Depot every 6mo. Last shot Jan 31st, next shot due April 18-May 2   Return in about 1 year (around 07/30/2023) for annual check in.   JJanyth Pupa DO Attending [place] [facility] [place]

## 2022-09-28 ENCOUNTER — Other Ambulatory Visit (HOSPITAL_COMMUNITY): Payer: Self-pay | Admitting: Nurse Practitioner

## 2022-09-28 DIAGNOSIS — Z431 Encounter for attention to gastrostomy: Secondary | ICD-10-CM

## 2022-10-03 ENCOUNTER — Ambulatory Visit (HOSPITAL_COMMUNITY)
Admission: RE | Admit: 2022-10-03 | Discharge: 2022-10-03 | Disposition: A | Discharge status: Home / Self Care | Payer: Medicaid Other | Source: Ambulatory Visit | Attending: Physician Assistant | Admitting: Physician Assistant

## 2022-10-03 ENCOUNTER — Ambulatory Visit (HOSPITAL_COMMUNITY)
Admission: RE | Admit: 2022-10-03 | Discharge: 2022-10-03 | Disposition: A | Discharge status: Home / Self Care | Payer: Medicaid Other | Source: Ambulatory Visit | Attending: Nurse Practitioner | Admitting: Nurse Practitioner

## 2022-10-03 ENCOUNTER — Other Ambulatory Visit (HOSPITAL_COMMUNITY): Payer: Self-pay | Admitting: Physician Assistant

## 2022-10-03 DIAGNOSIS — E222 Syndrome of inappropriate secretion of antidiuretic hormone: Secondary | ICD-10-CM

## 2022-10-03 DIAGNOSIS — Z431 Encounter for attention to gastrostomy: Secondary | ICD-10-CM

## 2022-10-03 HISTORY — PX: IR REPLC GASTRO/COLONIC TUBE PERCUT W/FLUORO: IMG2333

## 2022-10-03 MED ORDER — DIATRIZOATE MEGLUMINE & SODIUM 66-10 % PO SOLN
ORAL | Status: AC
  Filled 2022-10-03: qty 30

## 2022-10-03 MED ORDER — DIATRIZOATE MEGLUMINE & SODIUM 66-10 % PO SOLN
30.0000 mL | Freq: Once | ORAL | Status: AC
  Administered 2022-10-03: 30 mL
  Filled 2022-10-03: qty 30

## 2022-10-03 MED ORDER — LIDOCAINE VISCOUS HCL 2 % MT SOLN
OROMUCOSAL | Status: AC
  Filled 2022-10-03: qty 15

## 2022-10-03 NOTE — Procedures (Signed)
PROCEDURE SUMMARY:  Successful removal of old surgically placed Gtube and placement of a new 24 french balloon retention Gtube.  Patient tolerated well.  EBL trace  See full dictation in Imaging for details.  Gwynneth Macleod PA-C 10/03/2022 12:41 PM

## 2023-01-26 ENCOUNTER — Emergency Department (HOSPITAL_COMMUNITY): Payer: Medicare Other

## 2023-01-26 ENCOUNTER — Encounter (HOSPITAL_COMMUNITY): Payer: Self-pay | Admitting: Emergency Medicine

## 2023-01-26 ENCOUNTER — Other Ambulatory Visit: Payer: Self-pay

## 2023-01-26 ENCOUNTER — Inpatient Hospital Stay (HOSPITAL_COMMUNITY)
Admission: EM | Admit: 2023-01-26 | Discharge: 2023-02-03 | DRG: 101 | Disposition: A | Discharge status: Skilled Nursing Facility | Payer: Medicare Other | Source: Skilled Nursing Facility | Attending: Internal Medicine | Admitting: Internal Medicine

## 2023-01-26 DIAGNOSIS — R451 Restlessness and agitation: Secondary | ICD-10-CM | POA: Diagnosis present

## 2023-01-26 DIAGNOSIS — R1310 Dysphagia, unspecified: Secondary | ICD-10-CM | POA: Diagnosis present

## 2023-01-26 DIAGNOSIS — Z1612 Extended spectrum beta lactamase (ESBL) resistance: Secondary | ICD-10-CM | POA: Diagnosis present

## 2023-01-26 DIAGNOSIS — Z79899 Other long term (current) drug therapy: Secondary | ICD-10-CM

## 2023-01-26 DIAGNOSIS — Z515 Encounter for palliative care: Secondary | ICD-10-CM | POA: Diagnosis not present

## 2023-01-26 DIAGNOSIS — E860 Dehydration: Secondary | ICD-10-CM | POA: Diagnosis present

## 2023-01-26 DIAGNOSIS — Z79890 Hormone replacement therapy (postmenopausal): Secondary | ICD-10-CM

## 2023-01-26 DIAGNOSIS — I959 Hypotension, unspecified: Secondary | ICD-10-CM | POA: Diagnosis present

## 2023-01-26 DIAGNOSIS — N179 Acute kidney failure, unspecified: Secondary | ICD-10-CM | POA: Diagnosis present

## 2023-01-26 DIAGNOSIS — I480 Paroxysmal atrial fibrillation: Secondary | ICD-10-CM | POA: Diagnosis present

## 2023-01-26 DIAGNOSIS — Z931 Gastrostomy status: Secondary | ICD-10-CM

## 2023-01-26 DIAGNOSIS — I69251 Hemiplegia and hemiparesis following other nontraumatic intracranial hemorrhage affecting right dominant side: Secondary | ICD-10-CM

## 2023-01-26 DIAGNOSIS — N939 Abnormal uterine and vaginal bleeding, unspecified: Secondary | ICD-10-CM | POA: Diagnosis present

## 2023-01-26 DIAGNOSIS — R638 Other symptoms and signs concerning food and fluid intake: Secondary | ICD-10-CM | POA: Diagnosis present

## 2023-01-26 DIAGNOSIS — Z885 Allergy status to narcotic agent status: Secondary | ICD-10-CM

## 2023-01-26 DIAGNOSIS — G40909 Epilepsy, unspecified, not intractable, without status epilepticus: Secondary | ICD-10-CM | POA: Diagnosis present

## 2023-01-26 DIAGNOSIS — Z7401 Bed confinement status: Secondary | ICD-10-CM

## 2023-01-26 DIAGNOSIS — Z8679 Personal history of other diseases of the circulatory system: Secondary | ICD-10-CM

## 2023-01-26 DIAGNOSIS — E8809 Other disorders of plasma-protein metabolism, not elsewhere classified: Secondary | ICD-10-CM | POA: Diagnosis present

## 2023-01-26 DIAGNOSIS — R9389 Abnormal findings on diagnostic imaging of other specified body structures: Secondary | ICD-10-CM | POA: Diagnosis present

## 2023-01-26 DIAGNOSIS — R4182 Altered mental status, unspecified: Secondary | ICD-10-CM | POA: Diagnosis not present

## 2023-01-26 DIAGNOSIS — I608 Other nontraumatic subarachnoid hemorrhage: Secondary | ICD-10-CM | POA: Diagnosis not present

## 2023-01-26 DIAGNOSIS — T474X5A Adverse effect of other laxatives, initial encounter: Secondary | ICD-10-CM | POA: Diagnosis not present

## 2023-01-26 DIAGNOSIS — K219 Gastro-esophageal reflux disease without esophagitis: Secondary | ICD-10-CM | POA: Diagnosis present

## 2023-01-26 DIAGNOSIS — F32A Depression, unspecified: Secondary | ICD-10-CM | POA: Diagnosis present

## 2023-01-26 DIAGNOSIS — N200 Calculus of kidney: Secondary | ICD-10-CM | POA: Diagnosis present

## 2023-01-26 DIAGNOSIS — Z993 Dependence on wheelchair: Secondary | ICD-10-CM

## 2023-01-26 DIAGNOSIS — T426X5A Adverse effect of other antiepileptic and sedative-hypnotic drugs, initial encounter: Secondary | ICD-10-CM | POA: Diagnosis present

## 2023-01-26 DIAGNOSIS — R197 Diarrhea, unspecified: Secondary | ICD-10-CM | POA: Diagnosis not present

## 2023-01-26 DIAGNOSIS — K521 Toxic gastroenteritis and colitis: Secondary | ICD-10-CM | POA: Diagnosis not present

## 2023-01-26 DIAGNOSIS — E872 Acidosis, unspecified: Secondary | ICD-10-CM | POA: Diagnosis present

## 2023-01-26 DIAGNOSIS — Z7189 Other specified counseling: Secondary | ICD-10-CM | POA: Diagnosis not present

## 2023-01-26 DIAGNOSIS — Z86711 Personal history of pulmonary embolism: Secondary | ICD-10-CM

## 2023-01-26 DIAGNOSIS — I619 Nontraumatic intracerebral hemorrhage, unspecified: Secondary | ICD-10-CM | POA: Diagnosis present

## 2023-01-26 DIAGNOSIS — F419 Anxiety disorder, unspecified: Secondary | ICD-10-CM | POA: Diagnosis present

## 2023-01-26 DIAGNOSIS — Z1152 Encounter for screening for COVID-19: Secondary | ICD-10-CM

## 2023-01-26 DIAGNOSIS — D500 Iron deficiency anemia secondary to blood loss (chronic): Secondary | ICD-10-CM | POA: Diagnosis present

## 2023-01-26 DIAGNOSIS — R569 Unspecified convulsions: Secondary | ICD-10-CM | POA: Diagnosis present

## 2023-01-26 DIAGNOSIS — N210 Calculus in bladder: Secondary | ICD-10-CM | POA: Diagnosis present

## 2023-01-26 DIAGNOSIS — N390 Urinary tract infection, site not specified: Secondary | ICD-10-CM | POA: Diagnosis present

## 2023-01-26 DIAGNOSIS — I69191 Dysphagia following nontraumatic intracerebral hemorrhage: Secondary | ICD-10-CM

## 2023-01-26 DIAGNOSIS — B9620 Unspecified Escherichia coli [E. coli] as the cause of diseases classified elsewhere: Secondary | ICD-10-CM | POA: Diagnosis present

## 2023-01-26 DIAGNOSIS — I69120 Aphasia following nontraumatic intracerebral hemorrhage: Secondary | ICD-10-CM

## 2023-01-26 DIAGNOSIS — E876 Hypokalemia: Secondary | ICD-10-CM | POA: Diagnosis not present

## 2023-01-26 LAB — URINALYSIS, ROUTINE W REFLEX MICROSCOPIC
Bilirubin Urine: NEGATIVE
Glucose, UA: NEGATIVE mg/dL
Ketones, ur: NEGATIVE mg/dL
Nitrite: NEGATIVE
Protein, ur: 30 mg/dL — AB
Specific Gravity, Urine: 1.009 (ref 1.005–1.030)
pH: 7 (ref 5.0–8.0)

## 2023-01-26 LAB — I-STAT CHEM 8, ED
BUN: 13 mg/dL (ref 8–23)
Calcium, Ion: 1.5 mmol/L — ABNORMAL HIGH (ref 1.15–1.40)
Chloride: 105 mmol/L (ref 98–111)
Creatinine, Ser: 1.1 mg/dL — ABNORMAL HIGH (ref 0.44–1.00)
Glucose, Bld: 105 mg/dL — ABNORMAL HIGH (ref 70–99)
HCT: 38 % (ref 36.0–46.0)
Hemoglobin: 12.9 g/dL (ref 12.0–15.0)
Potassium: 3.7 mmol/L (ref 3.5–5.1)
Sodium: 141 mmol/L (ref 135–145)
TCO2: 25 mmol/L (ref 22–32)

## 2023-01-26 LAB — COMPREHENSIVE METABOLIC PANEL
ALT: 21 U/L (ref 0–44)
AST: 26 U/L (ref 15–41)
Albumin: 3.4 g/dL — ABNORMAL LOW (ref 3.5–5.0)
Alkaline Phosphatase: 76 U/L (ref 38–126)
Anion gap: 11 (ref 5–15)
BUN: 13 mg/dL (ref 8–23)
CO2: 24 mmol/L (ref 22–32)
Calcium: 10.7 mg/dL — ABNORMAL HIGH (ref 8.9–10.3)
Chloride: 101 mmol/L (ref 98–111)
Creatinine, Ser: 1.08 mg/dL — ABNORMAL HIGH (ref 0.44–1.00)
GFR, Estimated: 58 mL/min — ABNORMAL LOW (ref >60)
Glucose, Bld: 114 mg/dL — ABNORMAL HIGH (ref 70–99)
Potassium: 3.8 mmol/L (ref 3.5–5.1)
Sodium: 136 mmol/L (ref 135–145)
Total Bilirubin: 0.4 mg/dL (ref 0.3–1.2)
Total Protein: 7.9 g/dL (ref 6.5–8.1)

## 2023-01-26 LAB — DIFFERENTIAL
Abs Immature Granulocytes: 0.01 10*3/uL (ref 0.00–0.07)
Basophils Absolute: 0 10*3/uL (ref 0.0–0.1)
Basophils Relative: 0 %
Eosinophils Absolute: 0 10*3/uL (ref 0.0–0.5)
Eosinophils Relative: 0 %
Immature Granulocytes: 0 %
Lymphocytes Relative: 15 %
Lymphs Abs: 1 10*3/uL (ref 0.7–4.0)
Monocytes Absolute: 0.6 10*3/uL (ref 0.1–1.0)
Monocytes Relative: 9 %
Neutro Abs: 4.9 10*3/uL (ref 1.7–7.7)
Neutrophils Relative %: 76 %

## 2023-01-26 LAB — RAPID URINE DRUG SCREEN, HOSP PERFORMED
Amphetamines: NOT DETECTED
Barbiturates: NOT DETECTED
Benzodiazepines: POSITIVE — AB
Cocaine: NOT DETECTED
Opiates: NOT DETECTED
Tetrahydrocannabinol: NOT DETECTED

## 2023-01-26 LAB — CBC
HCT: 31.6 % — ABNORMAL LOW (ref 36.0–46.0)
Hemoglobin: 9 g/dL — ABNORMAL LOW (ref 12.0–15.0)
MCH: 20.9 pg — ABNORMAL LOW (ref 26.0–34.0)
MCHC: 28.5 g/dL — ABNORMAL LOW (ref 30.0–36.0)
MCV: 73.3 fL — ABNORMAL LOW (ref 80.0–100.0)
Platelets: 201 10*3/uL (ref 150–400)
RBC: 4.31 MIL/uL (ref 3.87–5.11)
RDW: 20 % — ABNORMAL HIGH (ref 11.5–15.5)
WBC: 6.5 10*3/uL (ref 4.0–10.5)
nRBC: 0 % (ref 0.0–0.2)

## 2023-01-26 LAB — RESP PANEL BY RT-PCR (RSV, FLU A&B, COVID)  RVPGX2
Influenza A by PCR: NEGATIVE
Influenza B by PCR: NEGATIVE
Resp Syncytial Virus by PCR: NEGATIVE
SARS Coronavirus 2 by RT PCR: NEGATIVE

## 2023-01-26 LAB — LACTIC ACID, PLASMA
Lactic Acid, Venous: 3.5 mmol/L (ref 0.5–1.9)
Lactic Acid, Venous: 2.2 mmol/L (ref 0.5–1.9)

## 2023-01-26 LAB — VALPROIC ACID LEVEL: Valproic Acid Lvl: 17 ug/mL — ABNORMAL LOW (ref 50.0–100.0)

## 2023-01-26 LAB — PROTIME-INR
INR: 1.1 (ref 0.8–1.2)
Prothrombin Time: 13.9 seconds (ref 11.4–15.2)

## 2023-01-26 LAB — ETHANOL: Alcohol, Ethyl (B): <10 mg/dL (ref <10)

## 2023-01-26 LAB — CBG MONITORING, ED: Glucose-Capillary: 112 mg/dL — ABNORMAL HIGH (ref 70–99)

## 2023-01-26 LAB — APTT: aPTT: <20 seconds — ABNORMAL LOW (ref 24–36)

## 2023-01-26 LAB — MAGNESIUM: Magnesium: 1.9 mg/dL (ref 1.7–2.4)

## 2023-01-26 MED ORDER — GLYCOPYRROLATE 1 MG PO TABS
1.0000 mg | ORAL_TABLET | Freq: Three times a day (TID) | ORAL | Status: DC | PRN
  Administered 2023-01-29 – 2023-02-02 (×3): 1 mg via ORAL
  Filled 2023-01-26 (×4): qty 1

## 2023-01-26 MED ORDER — LEVETIRACETAM IN NACL 1000 MG/100ML IV SOLN
1000.0000 mg | Freq: Once | INTRAVENOUS | Status: AC
  Administered 2023-01-26: 1000 mg via INTRAVENOUS
  Filled 2023-01-26: qty 100

## 2023-01-26 MED ORDER — SODIUM CHLORIDE 0.9 % IV SOLN
100.0000 mg | Freq: Two times a day (BID) | INTRAVENOUS | Status: DC
  Administered 2023-01-26 – 2023-01-30 (×8): 100 mg via INTRAVENOUS
  Filled 2023-01-26 (×12): qty 10

## 2023-01-26 MED ORDER — LACTATED RINGERS IV SOLN: INTRAVENOUS | Status: AC

## 2023-01-26 MED ORDER — SODIUM CHLORIDE 0.9 % IV BOLUS
2000.0000 mL | Freq: Once | INTRAVENOUS | Status: AC
  Administered 2023-01-26: 2000 mL via INTRAVENOUS

## 2023-01-26 MED ORDER — SODIUM CHLORIDE 0.9 % IV SOLN: 2000.0000 mg | Freq: Once | INTRAVENOUS | Status: DC

## 2023-01-26 MED ORDER — VALPROATE SODIUM 100 MG/ML IV SOLN
125.0000 mg | Freq: Two times a day (BID) | INTRAVENOUS | Status: DC
  Administered 2023-01-26 – 2023-01-28 (×3): 125 mg via INTRAVENOUS
  Filled 2023-01-26 (×9): qty 1.25

## 2023-01-26 MED ORDER — LORAZEPAM 2 MG/ML IJ SOLN
INTRAMUSCULAR | Status: AC
  Administered 2023-01-26: 1 mg via INTRAVENOUS
  Filled 2023-01-26: qty 1

## 2023-01-26 MED ORDER — OMEPRAZOLE 2 MG/ML ORAL SUSPENSION
20.0000 mg | Freq: Every day | ORAL | Status: DC
  Filled 2023-01-26 (×5): qty 10

## 2023-01-26 MED ORDER — LEVETIRACETAM IN NACL 1000 MG/100ML IV SOLN
1000.0000 mg | INTRAVENOUS | Status: AC
  Administered 2023-01-26 (×2): 1000 mg via INTRAVENOUS
  Filled 2023-01-26 (×2): qty 100

## 2023-01-26 MED ORDER — SODIUM CHLORIDE 0.9 % IV SOLN
1.0000 g | Freq: Every day | INTRAVENOUS | Status: DC
  Administered 2023-01-26 – 2023-01-28 (×3): 1 g via INTRAVENOUS
  Filled 2023-01-26 (×3): qty 10

## 2023-01-26 MED ORDER — LORAZEPAM 2 MG/ML IJ SOLN: 1.0000 mg | Freq: Once | INTRAMUSCULAR | Status: AC

## 2023-01-26 MED ORDER — VALPROATE SODIUM 100 MG/ML IV SOLN: 125.0000 mg | Freq: Two times a day (BID) | INTRAVENOUS | Status: DC

## 2023-01-26 NOTE — ED Notes (Signed)
Family and MD at bedside. Per the family pt is non verbal and this is her baseline

## 2023-01-26 NOTE — ED Provider Notes (Signed)
[place] EMERGENCY DEPARTMENT AT [facility] Provider Note   CSN: [id number] Arrival date & time: 01/26/23  1530     History  Chief Complaint  Patient presents with   Seizures    Denise Savage is a 62 y.o. female.  Patient with history of seizures and stroke.  Patient had shaking today and then was brought in as altered mental status.  The history is provided by the nursing home. No language interpreter was used.  Seizures Seizure activity on arrival: no   Seizure type:  Grand mal Preceding symptoms: no sensation of an aura present   Initial focality:  None Episode characteristics: abnormal movements   Postictal symptoms: confusion   Return to baseline: yes   Severity:  Moderate Timing:  Clustered Progression:  Improving Context: not alcohol withdrawal        Home Medications Prior to Admission medications   Medication Sig Start Date End Date Taking? Authorizing Provider  acetaminophen (TYLENOL) 325 MG tablet Place 650 mg into feeding tube every 6 (six) hours as needed (mild pain).    [provider]  Dextromethorphan-quiNIDine (NUEDEXTA) 20-10 MG capsule 1 capsule every 12 (twelve) hours. Via G-Tube (0900 & 2100)    [provider]  escitalopram (LEXAPRO) 20 MG tablet Place 20 mg into feeding tube in the morning. (0900)    [provider]  glycopyrrolate (ROBINUL) 1 MG tablet Take 1 mg by mouth every 8 (eight) hours as needed (increased secretions).    [provider]  lacosamide (VIMPAT) 10 MG/ML oral solution Take 10 mLs (100 mg total) by mouth 2 (two) times daily. (0900 & 1700) 03/26/21   Lisbeth Renshaw, MD  levETIRAcetam (KEPPRA) 1000 MG tablet Place 1,000 mg into feeding tube 2 (two) times daily. (0900 & 2100)    [provider]  LORazepam (ATIVAN) 1 MG tablet Place 1 tablet (1 mg total) into feeding tube every 8 (eight) hours as needed for anxiety. (0000, 0800 & 1600) Patient taking differently: Place 1  mg into feeding tube in the morning, at noon, and at bedtime. (0600, 1400 & 2100) 03/26/21   Lisbeth Renshaw, MD  medroxyPROGESTERone (DEPO-PROVERA) 150 MG/ML injection Inject 1 mL (150 mg total) into the muscle every 3 (three) months. 07/29/22 10/27/22  Myna Hidalgo, DO  Melatonin 10 MG TABS Take 10 mg by mouth at bedtime. (2100)    [provider]  Mouthwashes (MOUTH RINSE) LIQD solution 15 mLs by Mouth Rinse route as needed (for oral care). 07/14/22   Shahmehdi, Gemma Payor, MD  Nutritional Supplements (ISOSOURCE 1.5 CAL) LIQD Place 237 mLs into feeding tube 5 (five) times daily. (0600, 1000, 1400, 1800 & 2200)    [provider]  omeprazole (KONVOMEP) 2 mg/mL SUSP oral suspension Place 20 mg into feeding tube daily.    [provider]  traZODone (DESYREL) 50 MG tablet Take 75 mg by mouth at bedtime. (2200) 02/21/21   [provider]  valproic acid (DEPAKENE) 250 MG/5ML solution Place 125 mg into feeding tube 2 (two) times daily. (0900 & 2100)    [provider]  Water For Irrigation, Sterile (FREE WATER) SOLN Place 30-240 mLs into feeding tube See admin instructions. Flush feeding tube with 240 mls of water twice daily between feedings, flush with 60 ml of water before and after initiating feedings &  Flush peg tube with 30 cc of water before and after medication administration    [provider]      Allergies  Hydrocodone    Review of Systems   Review of Systems  Unable to perform ROS: Mental status change  Neurological:  Positive for seizures.    Physical Exam Updated Vital Signs BP 123/68   Pulse 71   Temp 98.6 F (37 C) (Rectal)   Resp 15   Ht 5\' 7"  (1.702 m)   Wt 87 kg   SpO2 100%   BMI 30.04 kg/m  Physical Exam Vitals and nursing note reviewed.  Constitutional:      Appearance: She is well-developed.     Comments: Lethargic  HENT:     Head: Normocephalic.     Nose: Nose normal.  Eyes:     General: No scleral  icterus.    Conjunctiva/sclera: Conjunctivae normal.  Neck:     Thyroid: No thyromegaly.  Cardiovascular:     Rate and Rhythm: Normal rate and regular rhythm.     Heart sounds: No murmur heard.    No friction rub. No gallop.  Pulmonary:     Breath sounds: No stridor. No wheezing or rales.  Chest:     Chest wall: No tenderness.  Abdominal:     General: There is no distension.     Tenderness: There is no abdominal tenderness. There is no rebound.  Musculoskeletal:        General: Normal range of motion.     Cervical back: Neck supple.  Lymphadenopathy:     Cervical: No cervical adenopathy.  Skin:    Findings: No erythema or rash.  Neurological:     Motor: No abnormal muscle tone.     Coordination: Coordination normal.     Comments: Patient oriented to person only, patient has weakness on the right side secondary to stroke.  She also has a PEG tube  Psychiatric:        Behavior: Behavior normal.     ED Results / Procedures / Treatments   Labs (all labs ordered are listed, but only abnormal results are displayed) Labs Reviewed  APTT - Abnormal; Notable for the following components:      Result Value   aPTT <20 (*)    All other components within normal limits  CBC - Abnormal; Notable for the following components:   Hemoglobin 9.0 (*)    HCT 31.6 (*)    MCV 73.3 (*)    MCH 20.9 (*)    MCHC 28.5 (*)    RDW 20.0 (*)    All other components within normal limits  COMPREHENSIVE METABOLIC PANEL - Abnormal; Notable for the following components:   Glucose, Bld 114 (*)    Creatinine, Ser 1.08 (*)    Calcium 10.7 (*)    Albumin 3.4 (*)    GFR, Estimated 58 (*)    All other components within normal limits  RAPID URINE DRUG SCREEN, HOSP PERFORMED - Abnormal; Notable for the following components:   Benzodiazepines POSITIVE (*)    All other components within normal limits  URINALYSIS, ROUTINE W REFLEX MICROSCOPIC - Abnormal; Notable for the following components:   APPearance  CLOUDY (*)    Hgb urine dipstick SMALL (*)    Protein, ur 30 (*)    Leukocytes,Ua LARGE (*)    Bacteria, UA FEW (*)    All other components within normal limits  VALPROIC ACID LEVEL - Abnormal; Notable for the following components:   Valproic Acid Lvl 17 (*)    All other components within normal limits  LACTIC ACID, PLASMA - Abnormal; Notable for the following  components:   Lactic Acid, Venous 3.5 (*)    All other components within normal limits  CBG MONITORING, ED - Abnormal; Notable for the following components:   Glucose-Capillary 112 (*)    All other components within normal limits  I-STAT CHEM 8, ED - Abnormal; Notable for the following components:   Creatinine, Ser 1.10 (*)    Glucose, Bld 105 (*)    Calcium, Ion 1.50 (*)    All other components within normal limits  RESP PANEL BY RT-PCR (RSV, FLU A&B, COVID)  RVPGX2  CULTURE, BLOOD (ROUTINE X 2)  CULTURE, BLOOD (ROUTINE X 2)  ETHANOL  PROTIME-INR  DIFFERENTIAL  MAGNESIUM  LACTIC ACID, PLASMA    EKG None  Radiology DG Chest Port 1 View  Result Date: 01/26/2023 CLINICAL DATA:  Weakness EXAM: PORTABLE CHEST 1 VIEW COMPARISON:  07/13/2022 x-ray and older FINDINGS: Stable cardiopericardial silhouette. No consolidation, pneumothorax or effusion. No edema. Overlapping cardiac leads. Degenerative changes seen along the spine and shoulders IMPRESSION: No acute cardiopulmonary disease. Electronically Signed   By: Karen Kays M.D.   On: 01/26/2023 17:56   CT HEAD WO CONTRAST ( )  Result Date: 01/26/2023 CLINICAL DATA:  Neuro deficit, acute, stroke suspected EXAM: CT HEAD WITHOUT CONTRAST TECHNIQUE: Contiguous axial images were obtained from the base of the skull through the vertex without intravenous contrast. RADIATION DOSE REDUCTION: This exam was performed according to the departmental dose-optimization program which includes automated exposure control, adjustment of the mA and/or kV according to patient size and/or use of  iterative reconstruction technique. COMPARISON:  CT head 07/13/2022. FINDINGS: Brain: Left cerebral encephalomalacia. Similar ex vacuo ventricular dilation on the left. No evidence of acute hemorrhage, mass lesion or new midline shift. No hydrocephalus. Vascular: Embolization coils in the left MCA. Skull: Left-sided craniotomy.  No acute fracture. Sinuses/Orbits: Clear sinuses.  No acute findings. Other: No mastoid effusions. IMPRESSION: 1. No evidence of acute intracranial abnormality. 2. Unchanged left cerebral encephalomalacia. Electronically Signed   By: Feliberto Harts M.D.   On: 01/26/2023 17:42    Procedures Procedures    Medications Ordered in ED Medications  lactated ringers infusion ( Intravenous New Bag/Given 01/26/23 1828)  lacosamide (VIMPAT) 100 mg in sodium chloride 0.9 % 25 mL IVPB (0 mg Intravenous Stopped 01/26/23 1828)  valproate (DEPACON) 125 mg in dextrose 5 % 50 mL IVPB (0 mg Intravenous Stopped 01/26/23 1853)  sodium chloride 0.9 % bolus 2,000 mL (2,000 mLs Intravenous Bolus 01/26/23 1610)  levETIRAcetam (KEPPRA) IVPB 1000 mg/100 mL premix (0 mg Intravenous Stopped 01/26/23 1704)  LORazepam (ATIVAN) injection 1 mg (1 mg Intravenous Given 01/26/23 1609)  levETIRAcetam (KEPPRA) IVPB 1000 mg/100 mL premix (0 mg Intravenous Stopped 01/26/23 1852)    ED Course/ Medical Decision Making/ A&P   CRITICAL CARE Performed by: Bethann Berkshire Total critical care time: 40 minutes Critical care time was exclusive of separately billable procedures and treating other patients. Critical care was necessary to treat or prevent imminent or life-threatening deterioration. Critical care was time spent personally by me on the following activities: development of treatment plan with patient and/or surrogate as well as nursing, discussions with consultants, evaluation of patient's response to treatment, examination of patient, obtaining history from patient or surrogate, ordering and performing  treatments and interventions, ordering and review of laboratory studies, ordering and review of radiographic studies, pulse oximetry and re-evaluation of patient's condition.  Click here for ABCD2, HEART and other calculatorsREFRESH Note before signing :1}  Medical Decision Making Amount and/or Complexity of Data Reviewed Labs: ordered. Radiology: ordered. ECG/medicine tests: ordered.  Risk Prescription drug management. Decision regarding hospitalization.  This patient presents to the ED for concern of altered mental status, this involves an extensive number of treatment options, and is a complaint that carries with it a high risk of complications and morbidity.  The differential diagnosis includes seizure, stroke   Co morbidities that complicate the patient evaluation  Seizures and stroke   Additional history obtained:  Additional history obtained from nursing home and paramedics External records from outside source obtained and reviewed including hospital records   Lab Tests:  I Ordered, and personally interpreted labs.  The pertinent results include: Hemoglobin 9, urinalysis appears infected   Imaging Studies ordered:  I ordered imaging studies including CT head I independently visualized and interpreted imaging which showed acute findings I agree with the radiologist interpretation   Cardiac Monitoring: / EKG:  The patient was maintained on a cardiac monitor.  I personally viewed and interpreted the cardiac monitored which showed an underlying rhythm of: Normal sinus rhythm   Consultations Obtained:  I requested consultation with the GI and hospitalist,  and discussed lab and imaging findings as well as pertinent plan - they recommend: Admit to hospitalist with neurology consult   Problem List / ED Course / Critical interventions / Medication management  Altered mental status and seizures I ordered medication including Ativan,  Keppra and Depakote Reevaluation of the patient after these medicines showed that the patient improved I have reviewed the patients home medicines and have made adjustments as needed   Social Determinants of Health:  Nursing home placement   Test / Admission - Considered:  EEG  Patient with history of strokes and has altered mental status with seizures.  She was seen by neurology who has recommended admission to [facility] with continuous EEG monitoring.  The admission will be done by the hospitalist        Final Clinical Impression(s) / ED Diagnoses Final diagnoses:  Seizure [facility])    Rx / DC Orders ED Discharge Orders     None         Bethann Berkshire, MD 01/27/23 1116

## 2023-01-26 NOTE — H&P (Signed)
TRH H&P   Patient Demographics:    Denise Savage, is a 62 y.o. female  MRN: [id number]   DOB - 08-28-1960  Admit Date - 01/26/2023  Outpatient Primary MD for the patient is Babs Sciara, MD  Referring MD/NP/PA: Dr Estell Harpin  Patient coming from: SNF  Chief Complaint  Patient presents with   Seizures      HPI:    Denise Savage  is a 62 y.o. female,  female from nursing home, [place], contracted with tube feeds.  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 -Patient was brought to ED for concerns of seizures, patient is with known history of seizures, patient was brought to ED for episode of unresponsiveness, and concern of seizures, patient was at normal state of health this afternoon, she was found to be unresponsive around 2:45 PM, SNF describe seizure-like activity, which is left-sided tremulousness, he received Ativan for concerns of seizures, this activity was noted while in ED as well, and she was called as a code stroke initially but on further evaluation there was a concern of seizures, -In ED code stroke was called, findings were more concerning for seizure than actual stroke, she was initially hypotensive responded to fluid bolus, her UA was positive,initial lactic acid elevated at 3.5, improved with IV fluids, CT head with no evidence of acute intracranial abnormality, teleneurology consult requested admission to [facility] for LTM EEG, [facility] platelets requested to admit.   Review of systems:    Patient is nonverbal, cannot provide review of system   With Past History of the following :     Past Medical History:  Diagnosis Date   Abdominal hernia    Anxiety    Depression    Dysphagia    G tube feedings (HCC)    G tube feedings (HCC)    Gastrostomy status (HCC)    GERD (gastroesophageal reflux disease)    Hemiplegia and hemiparesis following cerebral infarction affecting right dominant side (HCC)    Non-verbal learning disorder    Pulmonary embolus (HCC)    Seizure (HCC)    Seizures (HCC)    Stroke [facility])    Wheelchair dependent       Past Surgical History:  Procedure Laterality Date   CRANIOPLASTY Left 03/23/2021   Procedure: CRANIOPLASTY, HARVEST ABDOMINAL BONE FLAP;  Surgeon: Lisbeth Renshaw, MD;  Location: [facility] OR;  Service: Neurosurgery;  Laterality: Left;   CRANIOTOMY Left 06/19/2020   Procedure: CRANIOTOMY HEMATOMA EVACUATION SUBDURAL;  Surgeon: Jadene Pierini, MD;  Location: [facility] OR;  Service: Neurosurgery;  Laterality: Left;   ESOPHAGOGASTRODUODENOSCOPY N/A 07/01/2020   Procedure: ESOPHAGOGASTRODUODENOSCOPY (EGD);  Surgeon: Kris Mouton  N, MD;  Location: [facility] ENDOSCOPY;  Service: General;  Laterality: N/A;   GASTROSTOMY  01/27/2022   IR ANGIO INTRA EXTRACRAN SEL INTERNAL CAROTID BILAT MOD SED  06/16/2020   IR ANGIO VERTEBRAL SEL VERTEBRAL UNI R MOD SED  06/16/2020   IR ANGIOGRAM FOLLOW UP STUDY  06/16/2020   IR ANGIOGRAM FOLLOW UP STUDY  06/16/2020   IR ANGIOGRAM FOLLOW UP STUDY  06/16/2020   IR CT HEAD LTD  06/16/2020   IR NEURO EACH ADD'L AFTER BASIC UNI RIGHT (MS)  06/16/2020   IR PATIENT EVAL TECH 0-60 MINS  01/24/2022   IR RADIOLOGIST EVAL & MGMT  01/24/2022   IR REPLC GASTRO/COLONIC TUBE PERCUT W/FLUORO  10/03/2022   IR TRANSCATH/EMBOLIZ  06/16/2020   IR US GUIDE VASC ACCESS RIGHT  06/16/2020   PEG PLACEMENT N/A 07/01/2020   Procedure: PERCUTANEOUS ENDOSCOPIC GASTROSTOMY (PEG) PLACEMENT;  Surgeon: Diamantina Monks, MD;  Location: [facility] ENDOSCOPY;  Service: General;  Laterality: N/A;   RADIOLOGY WITH ANESTHESIA N/A 06/16/2020   Procedure: IR WITH ANESTHESIA;  Surgeon:  Lisbeth Renshaw, MD;  Location: [facility] OR;  Service: Radiology;  Laterality: N/A;   TUBAL LIGATION     VENTRAL HERNIA REPAIR N/A 12/29/2014   Procedure: LAPAROSCOPIC VENTRAL HERNIA WITH MESH;  Surgeon: Franky Macho, MD;  Location: [facility] ORS;  Service: General;  Laterality: N/A;      Social History:     Social History   Tobacco Use   Smoking status: Never   Smokeless tobacco: Never  Substance Use Topics   Alcohol use: No        Family History :     Family History  Problem Relation Age of Onset   Pancreatitis Neg Hx      Home Medications:   Prior to Admission medications   Medication Sig Start Date End Date Taking? Authorizing Provider  acetaminophen (TYLENOL) 325 MG tablet Place 650 mg into feeding tube every 6 (six) hours as needed (mild pain).    [provider]  Dextromethorphan-quiNIDine (NUEDEXTA) 20-10 MG capsule 1 capsule every 12 (twelve) hours. Via G-Tube (0900 & 2100)    [provider]  escitalopram (LEXAPRO) 20 MG tablet Place 20 mg into feeding tube in the morning. (0900)    [provider]  glycopyrrolate (ROBINUL) 1 MG tablet Take 1 mg by mouth every 8 (eight) hours as needed (increased secretions).    [provider]  lacosamide (VIMPAT) 10 MG/ML oral solution Take 10 mLs (100 mg total) by mouth 2 (two) times daily. (0900 & 1700) 03/26/21   Lisbeth Renshaw, MD  levETIRAcetam (KEPPRA) 1000 MG tablet Place 1,000 mg into feeding tube 2 (two) times daily. (0900 & 2100)    [provider]  LORazepam (ATIVAN) 1 MG tablet Place 1 tablet (1 mg total) into feeding tube every 8 (eight) hours as needed for anxiety. (0000, 0800 & 1600) Patient taking differently: Place 1 mg into feeding tube in the morning, at noon, and at bedtime. (0600, 1400 & 2100) 03/26/21   Lisbeth Renshaw, MD  medroxyPROGESTERone (DEPO-PROVERA) 150 MG/ML injection Inject 1 mL (150 mg total) into the muscle every 3 (three) months. 07/29/22 10/27/22  Myna Hidalgo, DO  Melatonin 10 MG TABS Take 10 mg by mouth at bedtime. (2100)    [provider]  Mouthwashes (MOUTH RINSE) LIQD solution 15 mLs by Mouth Rinse route as needed (for oral care). 07/14/22   ShahmehdiGemma Payor, MD  Nutritional Supplements (ISOSOURCE 1.5 CAL) LIQD Place 237  mLs into feeding tube 5 (five) times daily. (0600, 1000, 1400, 1800 & 2200)    [provider]  omeprazole (KONVOMEP) 2 mg/mL SUSP oral suspension Place 20 mg into feeding tube daily.    [provider]  traZODone (DESYREL) 50 MG tablet Take 75 mg by mouth at bedtime. (2200) 02/21/21   [provider]  valproic acid (DEPAKENE) 250 MG/5ML solution Place 125 mg into feeding tube 2 (two) times daily. (0900 & 2100)    [provider]  Water For Irrigation, Sterile (FREE WATER) SOLN Place 30-240 mLs into feeding tube See admin instructions. Flush feeding tube with 240 mls of water twice daily between feedings, flush with 60 ml of water before and after initiating feedings &  Flush peg tube with 30 cc of water before and after medication administration    [provider]     Allergies:     Allergies  Allergen Reactions   Hydrocodone Nausea Only     Physical Exam:   Vitals  Blood pressure 123/69, pulse 67, temperature 99.3 F (37.4 C), resp. rate 16, height 5\' 7"  (1.702 m), weight 87 kg, SpO2 94%.   1. General frail elderly  lying in bed in NAD, restless,  2. She is awake,, but nonverbal, noncommunicative, does not follow any commands.  Started hemiaplasia and aphasia  4. Ears and Eyes appear Normal, Conjunctivae clear, PERRLA. Moist Oral Mucosa.  5. Supple Neck, No JVD, No cervical lymphadenopathy appriciated, No Carotid Bruits.  6. Symmetrical Chest wall movement, Good air movement bilaterally, CTAB.  7. RRR, No Gallops, Rubs or Murmurs, No Parasternal Heave.  8. Positive Bowel Sounds, Abdomen Soft, No tenderness, No organomegaly appriciated,No rebound  -guarding or rigidity.  9.  No Cyanosis, Normal Skin Turgor, No Skin Rash or Bruise.      Data Review:    CBC Recent Labs  Lab 01/26/23 1555 01/26/23 1626  WBC 6.5  --   HGB 9.0* 12.9  HCT 31.6* 38.0  PLT 201  --   MCV 73.3*  --   MCH 20.9*  --   MCHC 28.5*  --   RDW 20.0*  --   LYMPHSABS 1.0  --   MONOABS 0.6  --   EOSABS 0.0  --   BASOSABS 0.0  --    ------------------------------------------------------------------------------------------------------------------  Chemistries  Recent Labs  Lab 01/26/23 1555 01/26/23 1626  NA 136 141  K 3.8 3.7  CL 101 105  CO2 24  --   GLUCOSE 114* 105*  BUN 13 13  CREATININE 1.08* 1.10*  CALCIUM 10.7*  --   MG 1.9  --   AST 26  --   ALT 21  --   ALKPHOS 76  --   BILITOT 0.4  --    ------------------------------------------------------------------------------------------------------------------ estimated creatinine clearance is 60.1 mL/min (A) (by C-G formula based on SCr of 1.1 mg/dL (H)). ------------------------------------------------------------------------------------------------------------------ No results for input(s): "TSH", "T4TOTAL", "T3FREE", "THYROIDAB" in the last 72 hours.  Invalid input(s): "FREET3"  Coagulation profile Recent Labs  Lab 01/26/23 1555  INR 1.1   ------------------------------------------------------------------------------------------------------------------- No results for input(s): "DDIMER" in the last 72 hours. -------------------------------------------------------------------------------------------------------------------  Cardiac Enzymes No results for input(s): "CKMB", "TROPONINI", "MYOGLOBIN" in the last 168 hours.  Invalid input(s): "CK" ------------------------------------------------------------------------------------------------------------------ No results found for:  "BNP"   ---------------------------------------------------------------------------------------------------------------  Urinalysis    Component Value Date/Time   COLORURINE YELLOW 01/26/2023 1804   APPEARANCEUR CLOUDY (A) 01/26/2023 1804   LABSPEC 1.009 01/26/2023 1804   PHURINE 7.0 01/26/2023  1804   GLUCOSEU NEGATIVE 01/26/2023 1804   HGBUR SMALL (A) 01/26/2023 1804   BILIRUBINUR NEGATIVE 01/26/2023 1804   KETONESUR NEGATIVE 01/26/2023 1804   PROTEINUR 30 (A) 01/26/2023 1804   NITRITE NEGATIVE 01/26/2023 1804   LEUKOCYTESUR LARGE (A) 01/26/2023 1804    ----------------------------------------------------------------------------------------------------------------   Imaging Results:    DG Chest Port 1 View  Result Date: 01/26/2023 CLINICAL DATA:  Weakness EXAM: PORTABLE CHEST 1 VIEW COMPARISON:  07/13/2022 x-ray and older FINDINGS: Stable cardiopericardial silhouette. No consolidation, pneumothorax or effusion. No edema. Overlapping cardiac leads. Degenerative changes seen along the spine and shoulders IMPRESSION: No acute cardiopulmonary disease. Electronically Signed   By: Karen Kays M.D.   On: 01/26/2023 17:56   CT HEAD WO CONTRAST ( )  Result Date: 01/26/2023 CLINICAL DATA:  Neuro deficit, acute, stroke suspected EXAM: CT HEAD WITHOUT CONTRAST TECHNIQUE: Contiguous axial images were obtained from the base of the skull through the vertex without intravenous contrast. RADIATION DOSE REDUCTION: This exam was performed according to the departmental dose-optimization program which includes automated exposure control, adjustment of the mA and/or kV according to patient size and/or use of iterative reconstruction technique. COMPARISON:  CT head 07/13/2022. FINDINGS: Brain: Left cerebral encephalomalacia. Similar ex vacuo ventricular dilation on the left. No evidence of acute hemorrhage, mass lesion or new midline shift. No hydrocephalus. Vascular: Embolization coils in the left MCA.  Skull: Left-sided craniotomy.  No acute fracture. Sinuses/Orbits: Clear sinuses.  No acute findings. Other: No mastoid effusions. IMPRESSION: 1. No evidence of acute intracranial abnormality. 2. Unchanged left cerebral encephalomalacia. Electronically Signed   By: Feliberto Harts M.D.   On: 01/26/2023 17:42     EKG:  Vent. rate 107 BPM PR interval 130 ms QRS duration 80 ms QT/QTcB 356/475 ms P-R-T axes 60 -17 51 Sinus tachycardia Borderline left axis deviation  Assessment & Plan:    Principal Problem:   Seizures (HCC) Active Problems:   Dysphagia   ICH (intracerebral hemorrhage) (HCC)   Aneurysmal subarachnoid hemorrhage (HCC)   AMS, concern for seizures -She is with known history of seizures, neurology input greatly appreciated, she will be admitted to [facility] for LTM EEG as discussed with Dr. Amada Jupiter. -Received Keppra load, to be followed by 1 g twice daily -Continue with home lacosamide 100 mg twice daily -Valproic acid level, continue with home 125 mg twice daily -Continue with seizure precautions  UTI -UA is positive, she is unable to provide any complaints, will start on Rocephin, follow on urine cultures  Hypotension Lactic acidosis -Hypotension resolved with IV fluids, lactic acidosis can be related to her hypotension, findings are and likely related to sepsis as she is nontoxic-appearing -Continue with IV fluids  Dysphagia ICH (intracerebral hemorrhage) (HCC) Aneurysmal subarachnoid hemorrhage (HCC) SAH (subarachnoid hemorrhage) (HCC) Status post craniectomy -Currently bedbound, nonverbal -Continue with supportive care -Strictly n.p.o., or feeding and meds via PEG tube, will hold tube feed today, it can be resumed in 24 hours once stable   Thickened endometrium/history of vaginal bleeding  -Did cause acute blood loss anemia previous admission, she was started on Depo-Provera, this is to be continued at time of discharge  DVT Prophylaxis  Heparin  AM Labs Ordered, also please review Full Orders  Family Communication: Admission, patients condition and plan of care including tests being ordered have been discussed with the patient and daughter at bedside who indicate understanding and agree with the plan and Code Status.  Code Status Full, confirmed by daughter  Likely DC to  back to SNF  Condition GUARDED    Consults called: neurology    Admission status: inpatient    Time spent in minutes : 70 minutes   Huey Bienenstock M.D on 01/26/2023 at 9:57 PM   [facility] - Office  [phone]

## 2023-01-26 NOTE — Consult Note (Signed)
[facility] Telemedicine Consult   Requesting Provider: Clelia Schaumann Consult Participants: Nursing,  Location of the provider: [facility] Location of the patient: [facility]  This consult was provided via telemedicine with 2-way video and audio communication. The patient/family was informed that care would be provided in this way and agreed to receive care in this manner.    Chief Complaint: Episode of unresponsiveness  HPI: Patient is a 62 year old female with a history of seizures, previous stroke with right-sided hemiplegia and aphasia at baseline who presents with acute change in mental status this afternoon.  She was in her normal state of health this afternoon, and then was found unresponsive around 245.  Nursing home describes seizure-like activity, but it sounds like the activity that they saw was very similar to what I am seeing here, which is left-sided tremulousness.  Given concern for seizure she was given a milligram of IV Ativan.  With her history of intracranial hemorrhage I do not think she is a tenecteplase candidate, and her current exam I do not feel is likely to be secondary to stroke.    LKW: 2:45 pm  tpa given?: No, history of intracranial bleeding IR Thrombectomy? No, poor baseline MRS: Four Time of teleneurologist evaluation: 3:50 pm  Exam: Vitals:   01/26/23 1545 01/26/23 1546  BP:  (!) 81/52  Pulse: 89 88  Resp: 20   Temp: 98.4 F (36.9 C)   SpO2: 99% 97%    General: In bed, crying out  1A: Level of Consciousness -1 1B: Ask Month and Age -37 1C: 'Blink Eyes' & 'Squeeze Hands' -2 2: Test Horizontal Extraocular Movements -1 3: Test Visual Fields -0 4: Test Facial Palsy -1 5A: Test Left Arm Motor Drift - 1  5B: Test Right Arm Motor Drift -3 6A: Test Left Leg Motor Drift - 2 6B: Test Right Leg Motor Drift - 4 7: Test Limb Ataxia - 0 8: Test Sensation - 0 9: Test Language/Aphasia- 3 10: Test Dysarthria -  2 11: Test Extinction/Inattention - 0 NIHSS score: 23   She is in bed, appears agitated.  She has tremulous activity of the left arm and leg.  Despite her left arm actively tremoring, when it is held aloft she is able to keep it aloft.  She does not do this to command.  She withdraws her left leg despite it actively tremoring as well.  This movement is not clearly clonic, but I do think there is a possibility that this does represent partial seizure.  She has a right gaze preference, but is not forcefully deviated, she does come towards midline, but I do not see or cross midline to the left.  Imaging Reviewed: CT head-pending  Labs reviewed in epic and pertinent values follow: Baseline creatinine 0.63 (from 6 months ago)   Assessment: 62 year old female with a history of stroke secondary to ruptured aneurysm with subsequent right hemiplegia and aphasia.  She presents with a change in mental status, and agitation.  The movements that we are seeing could be seizure but I do not think are clearly so.  It is possible that these represent agitation due to pain or distress, even enhanced physiological tremor in the setting of adrenergic response to hypotension.  The right sided gaze is more concerning, and therefore I will load with Keppra, though the Ativan seems to have calm down her tremoring considerably.  Recommendations:  1) Ativan 1 mg IV already given 2) CT head 3) BMP, CBC, magnesium  4) Keppra 3 g x 1, followed by 1 g twice daily 5) continue home lacosamide 100 mg twice daily she is due for it now so I will order this 6) valproic acid level, continue home 125 mg twice daily 7) transfer to [facility] for continuous EEG 8) neurology will continue to follow at [facility].   This patient is critically ill and at significant risk of neurological worsening, death and care requires constant monitoring of vital signs, hemodynamics,respiratory and cardiac monitoring, neurological assessment,  discussion with family, other specialists and medical decision making of high complexity. I spent 45 minutes of neurocritical care time  in the care of  this patient. This was time spent independent of any time provided by nurse practitioner or PA.  Ritta Slot, MD [facility] [phone]  If 7pm- 7am, please page neurology on call as listed in AMION. 01/26/2023  4:34 PM

## 2023-01-26 NOTE — ED Triage Notes (Signed)
Pt arrived by [facility] from [place]. EMS states they were called out for seizure like activity. Per the facilities doctor pt has a hx of stroke and seizures and was concerned for both. Pt LKW was 1445 today then went unresponsive & has been shaking. EMS states pt has been unresponsive the whole time. Pt responds to painful stimuli. VSS.

## 2023-01-26 NOTE — Sepsis Progress Note (Signed)
eLink is following this Code Sepsis. °

## 2023-01-26 NOTE — ED Notes (Signed)
Attempted to call lab x 2 times to get 2nd lactic.

## 2023-01-26 NOTE — ED Notes (Signed)
Per tele neurology MD - cancel code stroke . EDP aware

## 2023-01-26 NOTE — Progress Notes (Signed)
Code stroke cart activated at 1548. Tele neurology paged at 1549. Dr. Amada Jupiter on screen at 1551.  Ricci Barker, Multimedia programmer

## 2023-01-26 NOTE — ED Notes (Signed)
Pt's granddaughter at bedside to visit.

## 2023-01-27 ENCOUNTER — Inpatient Hospital Stay (HOSPITAL_COMMUNITY): Payer: Medicare Other

## 2023-01-27 DIAGNOSIS — I608 Other nontraumatic subarachnoid hemorrhage: Secondary | ICD-10-CM

## 2023-01-27 DIAGNOSIS — R569 Unspecified convulsions: Secondary | ICD-10-CM | POA: Diagnosis not present

## 2023-01-27 DIAGNOSIS — N390 Urinary tract infection, site not specified: Secondary | ICD-10-CM | POA: Diagnosis not present

## 2023-01-27 DIAGNOSIS — R9389 Abnormal findings on diagnostic imaging of other specified body structures: Secondary | ICD-10-CM | POA: Diagnosis not present

## 2023-01-27 LAB — CBC
HCT: 26.4 % — ABNORMAL LOW (ref 36.0–46.0)
Hemoglobin: 7.7 g/dL — ABNORMAL LOW (ref 12.0–15.0)
MCH: 20.8 pg — ABNORMAL LOW (ref 26.0–34.0)
MCHC: 29.2 g/dL — ABNORMAL LOW (ref 30.0–36.0)
MCV: 71.4 fL — ABNORMAL LOW (ref 80.0–100.0)
Platelets: 185 10*3/uL (ref 150–400)
RBC: 3.7 MIL/uL — ABNORMAL LOW (ref 3.87–5.11)
RDW: 19.5 % — ABNORMAL HIGH (ref 11.5–15.5)
WBC: 5.1 10*3/uL (ref 4.0–10.5)
nRBC: 0 % (ref 0.0–0.2)

## 2023-01-27 LAB — BASIC METABOLIC PANEL
Anion gap: 9 (ref 5–15)
BUN: 9 mg/dL (ref 8–23)
CO2: 23 mmol/L (ref 22–32)
Calcium: 10.3 mg/dL (ref 8.9–10.3)
Chloride: 106 mmol/L (ref 98–111)
Creatinine, Ser: 0.72 mg/dL (ref 0.44–1.00)
GFR, Estimated: >60 mL/min (ref >60)
Glucose, Bld: 87 mg/dL (ref 70–99)
Potassium: 3.5 mmol/L (ref 3.5–5.1)
Sodium: 138 mmol/L (ref 135–145)

## 2023-01-27 MED ORDER — FREE WATER: 30.0000 mL | Status: DC

## 2023-01-27 MED ORDER — ORAL CARE MOUTH RINSE
15.0000 mL | OROMUCOSAL | Status: DC
  Administered 2023-01-27 – 2023-02-03 (×29): 15 mL via OROMUCOSAL

## 2023-01-27 MED ORDER — HEPARIN SODIUM (PORCINE) 5000 UNIT/ML IJ SOLN
5000.0000 [IU] | Freq: Three times a day (TID) | INTRAMUSCULAR | Status: DC
  Administered 2023-01-27 – 2023-02-03 (×23): 5000 [IU] via SUBCUTANEOUS
  Filled 2023-01-27 (×23): qty 1

## 2023-01-27 MED ORDER — LACTATED RINGERS IV SOLN
INTRAVENOUS | Status: AC
  Administered 2023-01-27: 100 mL/h via INTRAVENOUS

## 2023-01-27 MED ORDER — ACETAMINOPHEN 650 MG RE SUPP: 650.0000 mg | Freq: Four times a day (QID) | RECTAL | Status: DC | PRN

## 2023-01-27 MED ORDER — HYDRALAZINE HCL 20 MG/ML IJ SOLN: 5.0000 mg | INTRAMUSCULAR | Status: DC | PRN

## 2023-01-27 MED ORDER — CHLORHEXIDINE GLUCONATE CLOTH 2 % EX PADS
6.0000 | MEDICATED_PAD | Freq: Every day | CUTANEOUS | Status: DC
  Administered 2023-01-27 – 2023-01-31 (×5): 6 via TOPICAL

## 2023-01-27 MED ORDER — ACETAMINOPHEN 325 MG PO TABS
650.0000 mg | ORAL_TABLET | Freq: Four times a day (QID) | ORAL | Status: DC | PRN
  Administered 2023-01-29 – 2023-02-03 (×6): 650 mg
  Filled 2023-01-27 (×7): qty 2

## 2023-01-27 MED ORDER — ORAL CARE MOUTH RINSE: 15.0000 mL | OROMUCOSAL | Status: DC | PRN

## 2023-01-27 NOTE — Assessment & Plan Note (Signed)
Patient unable to explain any symptoms.  UA for concern of UTI, urine cultures pending. -Continue with Rocephin -Follow-up urine cultures

## 2023-01-27 NOTE — Progress Notes (Signed)
ATRIUM MONITORING

## 2023-01-27 NOTE — Assessment & Plan Note (Signed)
Patient with history of seizure disorder, presented in postictal state with seizures at her facility.  Neurology was consulted and they advised transferring to [facility] for EEG.  Received Keppra load followed by 1 g twice daily in ED. -Continue with home lacosamide 100 mg twice daily -Valproic acid level, continue with home 125 mg twice daily -Continue with seizure precautions

## 2023-01-27 NOTE — Hospital Course (Addendum)
62 year old F with PMH of previous hemorrhagic stroke with right-sided hemiplegia, aphasia and dysphagia with tube feed dependence, subarachnoid hemorrhage, seizure disorder, anxiety, depression, GERD and embolic PE/paroxysmal A-fib not on anticoagulation due to blood loss anemia from abnormal uterine bleed brought to [facility] ED due to tremoring of bilateral upper extremities, crying out when approached and touched.  Patient is nonverbal.    On arrival to ED, reportedly postictal and hypotensive. Had lactic acidosis to 3.5.  Hypotension and lactic acidosis resolved quickly with IV fluid.  Foley catheter inserted at [facility] ED.  UA with pyuria. Cultures obtained and started on ceftriaxone.  CT head without acute finding.  Teleneurology consulted and recommended transfer to [facility] for continuous EEG.   Patient arrived at [facility] on 8/16.  Continuous EEG negative for seizure but suggests cortical dysfunction arising from left frontal region likely consistent with underlying craniotomy, and moderate diffuse encephalopathy.  Initially home Keppra was increased to 1000 mg twice daily but discontinued given concern for agitation/behavioral disturbance.  She was started on Vimpat and Depakote.  She had 2 brief episodes of witnessed seizure on 8/19.  She was loaded with Depakote followed by increased dose of Depakote.  LTM EEG unchanged from prior.  Neurology following and appreciate their evaluation and they have changed her to oral antiepileptics.   Urine culture with ESBL E. coli but sensitive to Zosyn.  Antibiotic changed to IV Zosyn on 8/18 and completed course with p.o. fosfomycin on 8/20.    Palliative medicine consulted.  Remains full code with full scope of care. Spiked a Temp 3 Days ago and Calcium was trending up so will reinitiate IVF and monitor and repeat blood culture (Negative so far).  Was planning on discharging yesterday but nurse feels she is having some stool coming out of the vagina today however this could be the  bloody discharge if she has more thickened and abnormal uterine bleeding. Obtained a CT scan of the abdomen pelvis and it ruled out rectovaginal fistula. She was having some diarrhea but attributed to Sorbitol Intolerance as the Valporic Acid had Sorbitol in it. Diarrhea also was improving. She is stable for D/C to SNF and will need to follow up with PCP and Neurology in the outpatient setting and she will need to have Seizure Precautions maintained.   Assessment and Plan:  Seizure disorder with concern for breakthrough seizure -Brought to [facility] due to tremoring of bilateral upper extremities, crying out when approached and touched.   -Reportedly postictal when she arrived in ED.   -She had received benzo and route to ED.   -UDS is positive for benzo.   -Had lactic acidosis to 3.5. CT head without acute finding.   -UA with pyuria.  VPA level subtherapeutic at 17 on admission.  -Continuous EEG without seizure or epileptiform but cortical dysfunction as above.  Continuously crying but distractible (chronic per family).  -Patient had another 2 brief seizure episodes on 8/19.   -Continuous EEG without new finding. -Appreciate input by neurology: -LTM EEG discontinued -Lorazepam 1 to 2 mg as needed for seizure -Continue Vimpat 100mg  BID and this has been changed to p.o. -Increase Depakote to 750 mg every 8 hours and this is also been changed to p.o. for the PEG tube -Maintain seizure precautions -Keppra discontinued due to behavioral disturbance/agitation -Resume Home Lorazepam at D/C  -Continue Seizure Precautions and close monitoring appreiacted Neurology Evaluation. She is stable to D/C and will need to follow up with Neurology in the outpatient setting.  Agitation, improved -Continuously crying but distractible.  Per daughter, [name] this is normal for her especially when family members are not around.  Her son usually visits when she was in [place].  He cannot drive to come to [facility]. -C/w  Reorientation and delirium precaution -P.o. Seroquel 25 mg twice daily as needed for agitation. Dr. Alanda Slim discussed with [name] about this. -C/w Home Meds as well with Nued[employer]a    ESBL E. coli UTI with recurrent fever, improved  -Patient is nonverbal.  Solely based on urine studies.   -Has no fever or leukocytosis.  Urine culture with ESBL E. coli. -Ceftriaxone 8/15>> IV Zosyn 8/18>> p.o. fosfomycin on 8/20 -Her temperature is improved but a few days ago she did have a Tmax of 100.5 but resolved  -WBC Normal -Given her recurrent fever we have repeated cultures -Repeat blood cultures x 2 showing no growth to date at 2 Days -Repeat urinalysis done and showed a hazy appearance with large leukocytes, negative nitrites, rare bacteria, greater than 50 WBCs -Repeat chest x-ray done showed "Subtly increased interstitial markings in the right lower lobe, which may reflect atelectasis or developing infiltrate." -CT Abd/Pelvis done and showed "Bladder lumen gas and wall thickening correlating with history of cystitis. Bladder calculi have decreased in number from 07/13/2022. Bilateral nephrolithiasis. No specific signs of upper tract infection. No indication of rectovaginal fistula." -Adequately treated for UTI and will need follow up with Urology as an outpatient for Nephrolithiasis and Bladder Calculi    AKI: -Baseline Cr ~0.6-0.7.  Likely due to hypotension.  Resolved. -BUN/Cr Trend: Recent Labs  Lab 01/28/23 1710 01/29/23 0456 01/30/23 0852 01/31/23 1032 02/01/23 1012 02/02/23 0305 02/03/23 0506  BUN 5* 7* <5* 5* <5* <5* 5*  CREATININE 0.79 0.69 1.08* 0.79 0.76 0.47 0.54  -Avoid Nephrotoxic Medications, Contrast Dyes, Hypotension and Dehydration to Ensure Adequate Renal Perfusion and will need to Renally Adjust Meds -Continue to Monitor and Trend Renal Function carefully and repeat CMP in the AM     Hypercalcemia -Given bolus 500 mL and re-initiated IVF with NS at 75 mL/hr but will stop  today and resume [employer]  Recent Labs  Lab 01/28/23 1710 01/29/23 0456 01/30/23 0852 01/31/23 1032 02/01/23 1012 02/02/23 0305 02/03/23 0506  CALCIUM 10.4* 10.0 10.3 10.8* 11.2* 10.6* 10.6*  -Continue to Monitor and Trend and Repeat CMP in the AM as calcium is improving  History of Aneurysm and SAH/ICH s/p craniotomy. -Bedbound and nonverbal.  Dysphagia with PEG tube dependence -N.p.o. -Appreciate help by dietitian with tube feeds and protocol per their recommendations and c/w Banatrol   Diarrhea -Likely Non-infectious as she is afebrile and has no WBC -In the setting of Sorbitol Intolerance and TF -Continue to Monitor and has Banatrol  -Was going to check GI Pathogen Panel but will cancel given that she has not had any further bowel movements -Continue to Monitor carefully    Iron deficiency and anemia due to chronic blood loss in the setting of abnormal uterine bleed and thickened endometrium -No obvious bleeding currently.  Some element of hemodilution.   -Anemia panel with iron deficiency iron level was 12, UIBC was 303, TIBC was 315, saturation ratios were 4%, ferritin level 6, folate of 25.5, and vitamin B12 level was 810 Recent Labs  Lab 01/28/23 1716 01/29/23 0456 01/30/23 0852 01/31/23 1032 02/01/23 1012 02/02/23 1657 02/03/23 0506  HGB 7.9* 7.8* 8.5* 9.1* 8.6* 9.4* 8.6*  HCT 27.2* 26.2* 29.1* 30.5* 30.0* 31.6* 29.2*  MCV 69.4* 70.4*  70.1* 71.1* 72.1* 71.7* 72.1*  -IV ferric gluconate 250 mg daily x 2.  Patient's condition makes it difficult for outpatient iron infusion. -On Depo-Provera injection outpatient -CT scan of the abdomen pelvis done and showed "Heterogeneous enlargement of the uterus, with globular shape. No discrete mass is seen. Less pronounced thickness of the endometrium when compared to before with faint calcific density measuring 3 mm at the lower endometrial canal, nonspecific. No indication of rectovaginal fistula" -Plan was for  outpatient follow-up with gynecology when she was discharged in February 2024   Advance care planning: -Patient with comorbidity as above.  Still full code.   -Dr. Alanda Slim really doubt if cardiopulmonary resuscitation is to the patient's best interest if we end up in the situation.  Attempted to call both daughters listed as emergency contact but no answer. -Palliative medicine consulted-remains full code with full scope of care as of now.   Lactic Acidosis -Multifactorial including possible seizure, hypotension or infection.  -Resolving as LA Level trend: Recent Labs  Lab 01/26/23 1555 01/26/23 1932  LATICACIDVEN 3.5* 2.2*  -Follow-up in the outpatient setting if necessary  Hypophosphatemia Hypokalemia -Phos and K+ Trend: Recent Labs  Lab 01/29/23 0456 01/30/23 0852 01/31/23 1032 02/01/23 1012 02/02/23 0305 02/03/23 0506  PHOS 2.0* 1.9* 2.1* 1.9* 3.8 2.7  K 3.3* 3.7 3.9 3.9 4.7 4.0  -Continue to Monitor and Replete as Necessary -Repeat CMP and Phos Level within 1 week  Stool from Vagina?,  Ruled out -Nursing reported that the patient was having some stool from the vagina today and has been cleaned up multiple times. -It could be possible that the patient was sitting in her own stool and now passing stool through the vagina but will need to rule out rectovaginal fistula so we will be obtaining a CT scan of the abdomen pelvis with contrast but she does have chronic blood loss and abnormal uterine bleeding, thickened endometrium but she is supposed to see gynecology for -CT scan of the abdomen pelvis done and showed "Bladder lumen gas and wall thickening correlating with history of cystitis. Bladder calculi have decreased in number from 07/13/2022.2. Bilateral nephrolithiasis. No specific signs of upper tract infection. No indication of rectovaginal fistula." -Will need to follow-up with gynecology in outpatient setting   Hypotension -Resolved.  Aneurysmal subarachnoid hemorrhage  (HCC) Patient has significant history of Dysphagia ICH (intracerebral hemorrhage) (HCC) Aneurysmal subarachnoid hemorrhage (HCC) SAH (subarachnoid hemorrhage) (HCC) Status post craniectomy -Currently bedbound, nonverbal -Continue with supportive care -Strictly n.p.o.; feeding and meds via PEG tube, resumed    Inadequate oral intake Estimated body mass index is 27.72 kg/m as calculated from the following:   Height as of this encounter: 5\' 7"  (1.702 m).   Weight as of this encounter: 80.3 kg. Nutrition Status: Nutrition Problem: Inadequate oral intake Etiology: dysphagia Signs/Symptoms: NPO status (pt with chronic G-tube)  Hypoalbuminemia -Patient's Albumin Trend: Recent Labs  Lab 01/26/23 1555 01/29/23 0456 01/30/23 0852 01/31/23 1032 02/01/23 1012 02/02/23 0305 02/03/23 0506  ALBUMIN 3.4* 2.8* 2.9* 3.0* 3.0* 2.7* 2.7*  -Continue to Monitor and Trend and repeat CMP in the AM

## 2023-01-27 NOTE — ED Notes (Addendum)
Attempted to call report to [phone]

## 2023-01-27 NOTE — Progress Notes (Signed)
Tried atrium monitoring second time, no response.

## 2023-01-27 NOTE — TOC Initial Note (Signed)
Transition of Care [facility]) - Initial/Assessment Note    Patient Details  Name: Denise Savage MRN: [id number] Date of Birth: April 02, 1961  Transition of Care [facility]) CM/SW Contact:    Baldemar Lenis, LCSW Phone Number: 01/27/2023, 4:05 PM  Clinical Narrative:     Patient is long term care resident at [facility], able to return when stable. CSW to follow.              Expected Discharge Plan: Skilled Nursing Facility Barriers to Discharge: Continued Medical Work up   Patient Goals and CMS Choice Patient states their goals for this hospitalization and ongoing recovery are:: patient unable to participate in goal setting, not oriented CMS Medicare.gov Compare Post Acute Care list provided to:: Patient Represenative (must comment) Choice offered to / list presented to : Adult Children [place] ownership interest in [facility].provided to:: Adult Children    Expected Discharge Plan and Services     Post Acute Care Choice: Skilled Nursing Facility Living arrangements for the past 2 months: Skilled Nursing Facility                                      Prior Living Arrangements/Services Living arrangements for the past 2 months: Skilled Nursing Facility Lives with:: Facility Resident Patient language and need for interpreter reviewed:: No Do you feel safe going back to the place where you live?: Yes      Need for Family Participation in Patient Care: Yes (Comment) Care giver support system in place?: Yes (comment)   Criminal Activity/Legal Involvement Pertinent to Current Situation/Hospitalization: No - Comment as needed  Activities of Daily Living      Permission Sought/Granted Permission sought to share information with : Facility Medical sales representative, Family Supports Permission granted to share information with : Yes, Verbal Permission Granted  Share Information with NAME: Juanell Fairly  Permission granted to share info w AGENCY: [employer] granted to share info w Relationship: Daughters     Emotional Assessment   Attitude/Demeanor/Rapport: Unable to Assess Affect (typically observed): Unable to Assess Orientation: :  (not oriented) Alcohol / Substance Use: Not Applicable Psych Involvement: No (comment)  Admission diagnosis:  Seizure (HCC) [R56.9] Seizures (HCC) [R56.9] Patient Active Problem List   Diagnosis Date Noted   UTI (urinary tract infection) 01/27/2023   Severe anemia 07/13/2022   Skull defect 03/23/2021   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   Seizures (HCC)    Pulmonary embolus (HCC)    Dysphagia    Prolonged QT interval    Thickened endometrium    ICH (intracerebral hemorrhage) (HCC) 06/18/2020   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   SAH (subarachnoid hemorrhage) (HCC) 06/15/2020   PCP:  Babs Sciara, MD Pharmacy:   [facility] [phone] - [place], [place] - [address] [place] [address] Phone: [phone] Fax: [phone]  [facility] - [place]. [place], [place] - [address] [place]. [place] [address] Phone: [phone] Fax: [phone]  [facility]  - [place], [place] - [address] [place] [address] Phone: [phone] Fax: [phone]     Social Determinants of Health (SDOH) Social History: SDOH Screenings   Food Insecurity: No Food Insecurity (07/14/2022)  Housing: Low Risk  (07/14/2022)  Transportation Needs:  No Transportation Needs (07/14/2022)  Utilities: Not At Risk (07/14/2022)  Tobacco Use: Low Risk  (01/26/2023)   SDOH Interventions:     Readmission Risk Interventions    09/08/2020    8:04 AM  Readmission Risk Prevention Plan  Transportation Screening Complete  PCP or Specialist Appt within 5-7 Days Complete  Home Care Screening Complete  Medication Review (RN CM) Complete

## 2023-01-27 NOTE — Assessment & Plan Note (Signed)
Patient has significant history of Dysphagia ICH (intracerebral hemorrhage) (HCC) Aneurysmal subarachnoid hemorrhage (HCC) SAH (subarachnoid hemorrhage) (HCC) Status post craniectomy -Currently bedbound, nonverbal -Continue with supportive care -Strictly n.p.o., or feeding and meds via PEG tube, will hold tube feed today, it can be resumed in 24 hours once stable

## 2023-01-27 NOTE — Assessment & Plan Note (Signed)
History of vaginal bleeding during previous admission and was started on Depo-Provera. -Continue Depo-Provera

## 2023-01-27 NOTE — ED Notes (Signed)
Updated daughter on transferring pt to [facility].

## 2023-01-27 NOTE — Progress Notes (Signed)
LTM EEG running - no initial skin breakdown - push button tested - neuro notified.  

## 2023-01-27 NOTE — Plan of Care (Signed)
°  Problem: Clinical Measurements: °Goal: Will remain free from infection °Outcome: Progressing °Goal: Respiratory complications will improve °Outcome: Progressing °  °

## 2023-01-27 NOTE — Progress Notes (Signed)
Called [facility], no response. Pt UNMONITORED at the moment, will call back later.

## 2023-01-27 NOTE — Progress Notes (Signed)
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  She initially came to [facility] and was transferred here for continuous EEG.  At baseline, patient is nonverbal and dependent for ADLs.  She did have some tremoring of the left arm and leg as well as right gaze deviation upon arrival to [facility], but it is unclear if this was the seizure activity seen in her facility.  Subjective: Patient is nonverbal and cries out when approached  Exam: Vitals:   01/27/23 1339 01/27/23 1704  BP: 116/67 138/68  Pulse:  65  Resp: 16 19  Temp: 98.9 F (37.2 C) 98.3 F (36.8 C)  SpO2: 98% 98%   Gen: In bed, crying out and agitated Resp: non-labored breathing on room air  Neuro: Mental Status: Patient rests with eyes open and cries out when approached by examiner.  She does not respond to name, is nonverbal, cannot follow commands but will occasionally mimic Cranial Nerves: Pupils round and reactive, tracks examiner around the room, face appears symmetrical, uncooperative with tongue protrusion Motor: Able to lift bilateral upper extremities off the bed, can move bilateral lower extremities and can lift them slightly off the bed Sensory: Appears intact to light touch throughout Gait: Deferred  Pertinent Labs:    Latest Ref Rng & Units 01/27/2023    5:24 AM 01/26/2023    4:26 PM 01/26/2023    3:55 PM  CBC  WBC 4.0 - 10.5 K/uL 5.1   6.5   Hemoglobin 12.0 - 15.0 g/dL 7.7  16.1  9.0   Hematocrit 36.0 - 46.0 % 26.4  38.0  31.6   Platelets 150 - 400 K/uL 185   201        Latest Ref Rng & Units 01/27/2023    2:52 PM 01/26/2023    4:26 PM 01/26/2023    3:55 PM  BMP  Glucose 70 - 99  mg/dL 87  096  045   BUN 8 - 23 mg/dL 9  13  13    Creatinine 0.44 - 1.00 mg/dL 4.09  8.11  9.14   Sodium 135 - 145 mmol/L 138  141  136   Potassium 3.5 - 5.1 mmol/L 3.5  3.7  3.8   Chloride 98 - 111 mmol/L 106  105  101   CO2 22 - 32 mmol/L 23   24   Calcium 8.9 - 10.3 mg/dL 78.2   95.6     Valproic acid level 17  Imaging Reviewed:  CT head: No acute abnormality, left cerebral encephalomalacia  Assessment: 62 year old patient with history of seizures, 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  On exam, she is noted to have tremoring of bilateral upper extremities and is nonverbal, crying out when approached or touched.  She cannot follow commands but will occasionally mimic.  Given history of seizures and report of seizure-like activity at nursing facility, it is reasonable to place her on long-term EEG.  She has also been treated for UTI by primary team, and infection could cause toxic metabolic encephalopathy mimicking seizure activity.  Impression: Seizure-like activity in nonverbal patient with previous seizures  Recommendations: -Long-term  EEG -Lorazepam 1 to 2 mg for further seizure activity -continue Keppra 1000mg  BID, Vimpat 100mg  BID and VPA 135mg  BID. -Maintain seizure precautions -Treatment of UTI per primary team -Neurology will continue to follow   E Ernestina Columbia , MSN, AGACNP-BC [facility] See Amion for schedule and pager information 01/27/2023 6:07 PM   NEUROHOSPITALIST ADDENDUM Performed a face to face diagnostic evaluation.   I have reviewed the contents of history and physical exam as documented by PA/ARNP/Resident and agree with above documentation.  I have discussed and formulated the above plan  as documented. Edits to the note have been made as needed.  Erick Blinks, MD [facility] [phone]   If 7pm to 7am, please call on call as listed on AMION.

## 2023-01-27 NOTE — Progress Notes (Signed)
Progress Note   Patient: Denise Savage ZOX:[id number] DOB: Aug 23, 1960 DOA: 01/26/2023     1 DOS: the patient was seen and examined on 01/27/2023   Brief hospital course: Taken from H&P.   Fahmida Mergel  is a 62 y.o. female,  female from nursing home, [facility], contracted with tube feeds.  with of history of hemorrhagic stroke,with history of questionable PE and  A-fib not anymore on anticoagulation secondary to acute blood loss anemia heavy menstrual bleed, severe dysphagia with tube feeds, seizures,R MCA SAH and then CVA with right sided weakness, GERD, anxiety, depression, and non-verbal s/p CVA brought to ED from her facility with concern of seizure and unresponsiveness.  Patient was initially hypotensive but responded well to IV fluid.  UA looked infected so she was started on Rocephin.  Elevated lactic acid at 3.5 initially which responded to IV fluid. CT head without any acute abnormality.  Teleneurology was consulted and they advised transferring to [facility] for EEG.  8/16: Patient is nonverbal at baseline, remained postictal, not following any commands.  Patient is being transferred to [facility] for further management and EEG    Assessment and Plan: * Seizures [facility]) Patient with history of seizure disorder, presented in postictal state with seizures at her facility.  Neurology was consulted and they advised transferring to [facility] for EEG.  Received Keppra load followed by 1 g twice daily in ED. -Continue with home lacosamide 100 mg twice daily -Valproic acid level, continue with home 125 mg twice daily -Continue with seizure precautions  UTI (urinary tract infection) Patient unable to explain any symptoms.  UA for concern of UTI, urine cultures pending. -Continue with Rocephin -Follow-up urine cultures  Aneurysmal subarachnoid hemorrhage (HCC) Patient has significant history of Dysphagia ICH (intracerebral hemorrhage) (HCC) Aneurysmal subarachnoid  hemorrhage (HCC) SAH (subarachnoid hemorrhage) (HCC) Status post craniectomy -Currently bedbound, nonverbal -Continue with supportive care -Strictly n.p.o., or feeding and meds via PEG tube, will hold tube feed today, it can be resumed in 24 hours once stable  Thickened endometrium History of vaginal bleeding during previous admission and was started on Depo-Provera. -Continue Depo-Provera   Subjective: Patient was pretty much nonverbal, transiently open eyes and not following any other commands.  Physical Exam: Vitals:   01/27/23 0715 01/27/23 0745 01/27/23 1145 01/27/23 1339  BP: 129/64 133/68 (!) 170/151 116/67  Pulse: 61 (!) 187 (!) 229   Resp: 19 15 (!) 28 16  Temp: 99 F (37.2 C) 98.9 F (37.2 C) 99.3 F (37.4 C) 98.9 F (37.2 C)  TempSrc:    Axillary  SpO2: 98% 95%  98%  Weight:    79.9 kg  Height:       General.  Pretty much unresponsive lady, in no acute distress. Pulmonary.  Lungs clear bilaterally, normal respiratory effort. CV.  Regular rate and rhythm, no JVD, rub or murmur. Abdomen.  Soft, nontender, nondistended, BS positive. CNS.  Lethargic, not following any commands Extremities.  No edema, no cyanosis, pulses intact and symmetrical.   Data Reviewed: Prior data reviewed  Family Communication: No family at bedside  Disposition: Status is: Inpatient Remains inpatient appropriate because: Severity of illness  Planned Discharge Destination: Patient is being transferred to [facility] , likely be discharged back to her facility Skilled nursing facility  DVT prophylaxis.  Subcu heparin Time spent: 50 minutes  This record has been created using Conservation officer, historic buildings. Errors have been sought and corrected,but may not always be located. Such creation errors  do not reflect on the standard of care.   Author: Arnetha Courser, MD 01/27/2023 3:21 PM  For on call review [email or web address].

## 2023-01-28 DIAGNOSIS — Z7189 Other specified counseling: Secondary | ICD-10-CM

## 2023-01-28 DIAGNOSIS — N390 Urinary tract infection, site not specified: Secondary | ICD-10-CM | POA: Diagnosis not present

## 2023-01-28 DIAGNOSIS — R569 Unspecified convulsions: Secondary | ICD-10-CM | POA: Diagnosis not present

## 2023-01-28 DIAGNOSIS — R9389 Abnormal findings on diagnostic imaging of other specified body structures: Secondary | ICD-10-CM | POA: Diagnosis not present

## 2023-01-28 DIAGNOSIS — Z515 Encounter for palliative care: Secondary | ICD-10-CM

## 2023-01-28 LAB — CBC
HCT: 27.2 % — ABNORMAL LOW (ref 36.0–46.0)
Hemoglobin: 7.9 g/dL — ABNORMAL LOW (ref 12.0–15.0)
MCH: 20.2 pg — ABNORMAL LOW (ref 26.0–34.0)
MCHC: 29 g/dL — ABNORMAL LOW (ref 30.0–36.0)
MCV: 69.4 fL — ABNORMAL LOW (ref 80.0–100.0)
Platelets: 191 10*3/uL (ref 150–400)
RBC: 3.92 MIL/uL (ref 3.87–5.11)
RDW: 19.6 % — ABNORMAL HIGH (ref 11.5–15.5)
WBC: 5.5 10*3/uL (ref 4.0–10.5)
nRBC: 0 % (ref 0.0–0.2)

## 2023-01-28 LAB — RETICULOCYTES
Immature Retic Fract: 24.8 % — ABNORMAL HIGH (ref 2.3–15.9)
RBC.: 3.85 MIL/uL — ABNORMAL LOW (ref 3.87–5.11)
Retic Count, Absolute: 38.5 10*3/uL (ref 19.0–186.0)
Retic Ct Pct: 1 % (ref 0.4–3.1)

## 2023-01-28 LAB — IRON AND TIBC
Iron: 12 ug/dL — ABNORMAL LOW (ref 28–170)
Saturation Ratios: 4 % — ABNORMAL LOW (ref 10.4–31.8)
TIBC: 315 ug/dL (ref 250–450)
UIBC: 303 ug/dL

## 2023-01-28 LAB — GLUCOSE, CAPILLARY: Glucose-Capillary: 145 mg/dL — ABNORMAL HIGH (ref 70–99)

## 2023-01-28 LAB — FOLATE: Folate: 25.5 ng/mL (ref >5.9)

## 2023-01-28 LAB — FERRITIN: Ferritin: 6 ng/mL — ABNORMAL LOW (ref 11–307)

## 2023-01-28 LAB — MAGNESIUM: Magnesium: 1.4 mg/dL — ABNORMAL LOW (ref 1.7–2.4)

## 2023-01-28 LAB — VITAMIN B12: Vitamin B-12: 810 pg/mL (ref 180–914)

## 2023-01-28 MED ORDER — VALPROATE SODIUM 100 MG/ML IV SOLN
125.0000 mg | Freq: Four times a day (QID) | INTRAVENOUS | Status: DC
  Administered 2023-01-28 – 2023-01-30 (×7): 125 mg via INTRAVENOUS
  Filled 2023-01-28 (×12): qty 1.25

## 2023-01-28 MED ORDER — POTASSIUM CHLORIDE 20 MEQ PO PACK
60.0000 meq | PACK | Freq: Once | ORAL | Status: AC
  Administered 2023-01-28: 60 meq
  Filled 2023-01-28: qty 3

## 2023-01-28 MED ORDER — ENSURE ENLIVE PO LIQD
237.0000 mL | Freq: Every day | ORAL | Status: DC
  Administered 2023-01-28: 237 mL via ORAL

## 2023-01-28 MED ORDER — PANTOPRAZOLE SODIUM 40 MG IV SOLR
40.0000 mg | INTRAVENOUS | Status: DC
  Administered 2023-01-28 – 2023-02-03 (×7): 40 mg via INTRAVENOUS
  Filled 2023-01-28 (×7): qty 10

## 2023-01-28 MED ORDER — JEVITY 1.5 CAL/FIBER PO LIQD
237.0000 mL | Freq: Every day | ORAL | Status: DC
  Administered 2023-01-28 – 2023-02-03 (×33): 237 mL
  Filled 2023-01-28 (×36): qty 237

## 2023-01-28 MED ORDER — FREE WATER
120.0000 mL | Freq: Every day | Status: DC
  Administered 2023-01-28 – 2023-02-03 (×33): 120 mL

## 2023-01-28 MED ORDER — FREE WATER: 30.0000 mL | Status: DC | PRN

## 2023-01-28 MED ORDER — METOPROLOL TARTRATE 5 MG/5ML IV SOLN
2.5000 mg | INTRAVENOUS | Status: DC | PRN
  Administered 2023-01-28: 2.5 mg via INTRAVENOUS
  Filled 2023-01-28 (×2): qty 5

## 2023-01-28 MED ORDER — LABETALOL HCL 5 MG/ML IV SOLN
10.0000 mg | INTRAVENOUS | Status: DC | PRN
  Administered 2023-02-02 (×2): 10 mg via INTRAVENOUS
  Filled 2023-01-28 (×2): qty 4

## 2023-01-28 MED ORDER — ISOSOURCE 1.5 CAL PO LIQD: 237.0000 mL | Freq: Every day | ORAL | Status: DC

## 2023-01-28 MED ORDER — MAGNESIUM SULFATE 2 GM/50ML IV SOLN
2.0000 g | Freq: Once | INTRAVENOUS | Status: AC
  Administered 2023-01-29: 2 g via INTRAVENOUS
  Filled 2023-01-28: qty 50

## 2023-01-28 NOTE — Plan of Care (Signed)

## 2023-01-28 NOTE — Consult Note (Signed)
Palliative Care Consult Note                                  Date: 01/28/2023   Patient Name: Denise Savage  DOB: 1961/04/16  MRN: [id number]  Age / Sex: 62 y.o., female  PCP: Babs Sciara, MD Referring Physician: Almon Hercules, MD  Reason for Consultation: Establishing goals of care  HPI/Patient Profile: 62 y.o. 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  She initially came to [facility] and was transferred to [facility] for evaluation and continuous EEG.    Past Medical History:  Diagnosis Date   Abdominal hernia    Anxiety    Depression    Dysphagia    G tube feedings (HCC)    G tube feedings (HCC)    Gastrostomy status (HCC)    GERD (gastroesophageal reflux disease)    Hemiplegia and hemiparesis following cerebral infarction affecting right dominant side (HCC)    Non-verbal learning disorder    Pulmonary embolus (HCC)    Seizure (HCC)    Seizures (HCC)    Stroke [facility])    Wheelchair dependent     Subjective:   I have reviewed medical records including EPIC notes, labs and imaging, assessed the patient and then spoke with daughter Eleesa Vanaken by phone to discuss diagnosis prognosis, GOC, EOL wishes, disposition and options. On evaluation patient was sleeping but easily awoke. She is nonverbal but cried out as I approached her. She is unable to discuss goals of care.  I introduced Palliative Medicine as specialized medical care for people living with serious illness. It focuses on providing relief from symptoms and stress of  a serious illness. The goal is to improve quality of life for both the patient and the family.   Patient/Family Understanding of Illness: The patient's daughter Skyllar Tortorice has a good understanding of the patient's chronic and acute illness.   Social History: The patient has two adult children and one adult son. 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 After discharge from [facility] in 2022 she went to [place] to a facility that could care for her trach for 6 months where her functional status declined and she became contractured. She then went to [place] where they worked to improved her movements. She requires care for all ADLs. She has a G tube for nutrition.  Today's Discussion: We discussed the patient's chronic and acute illness and her continued functional decline.  Recommended consideration of DNR status, understanding evidenced-based poor outcomes in similar hospitalized patients, as the cause of the arrest is likely associated with chronic/terminal disease rather than a reversible acute cardio-pulmonary event. Discussed my concern that attempting a resuscitation could put the  patient in a worse condition. She would like to keep her mother FULL code at this time. She does agree to discuss with her brother and sister.  Discussed the importance of continued conversation with family and the medical providers regarding overall plan of care and treatment options, ensuring decisions are within the context of the patient's values and GOCs.  Questions and concerns were addressed. Hard Choices booklet left for review. [name] was encouraged to call with questions or concerns. She plans to come to the hospital today. We discussed having a family meeting to discuss her mother's condition, plan of care, and code status. She will talk to her siblings to  arrange.  PMT will continue to support holistically.   Review of Systems  Unable to perform ROS   Objective:   Primary Diagnoses: Present on Admission:  Aneurysmal subarachnoid hemorrhage (HCC)  UTI (urinary tract infection)  Thickened endometrium   Physical Exam Constitutional:      General: She is sleeping.  Pulmonary:     Effort: Pulmonary effort is normal.  Skin:    General: Skin is warm and dry.  Neurological:     Mental Status: She is easily aroused.     Motor: Tremor present.     Comments: aphasic     Vital Signs:  BP (!) 155/76 (BP Location: Right Leg)   Pulse 99   Temp 99.6 F (37.6 C) (Oral)   Resp 19   Ht 5\' 7"  (1.702 m)   Wt 79.9 kg   SpO2 100%   BMI 27.59 kg/m     Advanced Care Planning:   Existing Vynca/ACP Documentation: None  Primary Decision Maker: HCPOA (asked for a copy): Daughter Skyeler Acoff states she is HCPOA. I asked her to bring in the document.  Code Status/Advance Care Planning: Full code  Assessment & Plan:   SUMMARY OF RECOMMENDATIONS   Full code/full scope Continued conversation with patient's HCPOA Jacalyn Lefevre re: goals of care Continued PMT support   Discussed with: Dr. Alanda Slim  Time Total: 40 minutes   Thank you for allowing [place] to participate in the care of CARMELA MCELHENNEY PMT will continue to support holistically.    Signed by: Sarina Ser, NP Palliative Medicine Team  Team Phone # [phone] (Nights/Weekends)  01/28/2023, 1:01 PM

## 2023-01-28 NOTE — Progress Notes (Signed)
Initial Nutrition Assessment  DOCUMENTATION CODES:   Not applicable  INTERVENTION:   Jevity 1.5- Give one carton five times daily via tube- Flush with 60ml of water before and after each feeding.   Regimen provides 1775kcal/day, 76g/day protein and 1517ml/day of free water.   Pt at mild refeed risk; recommend monitor potassium, magnesium and phosphorus labs daily until stable  Daily weights   NUTRITION DIAGNOSIS:   Inadequate oral intake related to dysphagia as evidenced by NPO status (pt with chronic G-tube).  GOAL:   Patient will meet greater than or equal to 90% of their needs  MONITOR:   Labs, Weight trends, TF tolerance, Skin, I & O's  REASON FOR ASSESSMENT:   Consult Enteral/tube feeding initiation and management  ASSESSMENT:   62 y/o female with a history of seizures, SAH s/p left hemicraniectomy and PEG tube placement (2022), previous stroke with right-sided hemiplegia and aphasia, PE, GERD, anxiety, depression and ventral hernia s/p mesh repair (2016) who is admitted with UTI and possible seizures.  RD working remotely.  Pt is non-verbal. Spoke with RN at [facility] where pt resides. Pt's home tube feed regimen consists of Jevity 1.5, five cartons daily along with 60ml or water before and after each feed and additional of water twice daily. Pt does not receive any additional protein modulars. Pt is NPO and does not eat anything by mouth. Pt is mainly bed/wheelchair bound. Will resume pt's home tube feed regimen. Pt is at mild refeed risk.   Per chart, pt appears to be down ~16lbs(8%) since February; RD unsure if this is true weight loss as pt receives tube feedings. SNF reports pt's last weight from 7/22 was 179lbs.   Medications reviewed and include: heparin, protonix, ceftriaxone, LRS @75ml /hr  Labs reviewed: K 3.5 wnl Hgb 7.7(L), Hct 26.4(L), MCV 71.4(L), MCH 20.8(L), MCHC 29.2(L)- 8/16  Diet Order:   Diet Order             Diet NPO time  specified Except for: Sips with Meds  Diet effective now                  EDUCATION NEEDS:   No education needs have been identified at this time  Skin:  Skin Assessment: Reviewed RN Assessment  Last BM:  8/16- type 5  Height:   Ht Readings from Last 1 Encounters:  01/26/23 5\' 7"  (1.702 m)    Weight:   Wt Readings from Last 1 Encounters:  01/27/23 79.9 kg    Ideal Body Weight:  61.3 kg  BMI:  Body mass index is 27.59 kg/m.  Estimated Nutritional Needs:   Kcal:  1600-1800kcal/day  Protein:  80-90g/day  Fluid:  1.8-2.1L/day  Betsey Holiday MS, RD, LDN Please refer to [facility] for RD and/or RD on-call/weekend/after hours pager

## 2023-01-28 NOTE — Progress Notes (Addendum)
PROGRESS NOTE  Denise Savage BJY:[id number] DOB: 03/08/1961   PCP: Babs Sciara, MD  Patient is from: Nursing home  DOA: 01/26/2023 LOS: 2  Chief complaints Chief Complaint  Patient presents with   Seizures     Brief Narrative / Interim history: 62 year old F with PMH of previous hemorrhagic stroke with right-sided hemiplegia, aphasia and dysphagia with tube feed dependence, subarachnoid hemorrhage, seizure disorder, anxiety, depression, GERD and embolic PE/paroxysmal A-fib not on anticoagulation due to blood loss anemia from abnormal uterine bleed brought to [facility] ED due to tremoring of bilateral upper extremities, crying out when approached and touched.  Patient is nonverbal.   On arrival to ED, reportedly postictal and hypotensive. Had lactic acidosis to 3.5.  Hypotension and lactic acidosis resolved quickly with IV fluid.  UA with pyuria. Cultures obtained and started on ceftriaxone.  CT head without acute finding.  Teleneurology consulted and recommended transfer to [facility] for continuous EEG.  Patient arrived at [facility] on 8/16.  Continuous EEG negative for seizure but suggests cortical dysfunction arising from left frontal region likely consistent with underlying craniotomy, and moderate diffuse encephalopathy.  Initially home Keppra was increased to 1000 mg twice daily but discontinued given concern for agitation/behavioral disturbance.  Currently on home Vimpat and Depakote.  Denies EEG ongoing.  Neurology following.  Palliative medicine consulted.   Subjective: Seen and examined earlier this morning.  No major events overnight of this morning.  Patient is nonverbal and does not follow commands.  Does not appear to be in distress.  Noted to have some twitching mainly on the left face.  Some shaking in left arm as well.  Objective: Vitals:   01/27/23 2015 01/27/23 2319 01/28/23 0753 01/28/23 1130  BP: 130/70 (!) 117/100 (!) 147/75 (!) 155/76  Pulse: 67 76 92 99  Resp: 18 18 18 19    Temp: 98.9 F (37.2 C) 98 F (36.7 C) 98.7 F (37.1 C) 99.6 F (37.6 C)  TempSrc: Oral Oral Oral Oral  SpO2: 97% 100% 97% 100%  Weight:      Height:        Examination:  GENERAL: No apparent distress.  Nontoxic. HEENT: MMM.  Vision and hearing grossly intact.  NECK: Supple.  No apparent JVD.  RESP:  No IWOB.  Fair aeration bilaterally. CVS:  RRR. Heart sounds normal.  ABD/GI/GU: BS+. Abd soft, NTND.  Foley and G-tube in place. MSK/EXT: Moves upper extremities.  Does not move lower extremities.  Noted contractures seen legs. SKIN: no apparent skin lesion or wound NEURO: Sleepy but wakes to voice.  Nonverbal.  Does not follow command.  Some twitching in left face.  Some shaking in left arm.  Lower extremity contractures and deformities. PSYCH: Calm.  No distress or agitation.  Procedures:  None  Microbiology summarized: COVID-19, influenza and RSV PCR nonreactive Blood cultures NGTD Urine culture with GNR  Assessment and plan: Principal Problem:   Seizures (HCC) Active Problems:   UTI (urinary tract infection)   Aneurysmal subarachnoid hemorrhage (HCC)   Thickened endometrium  Seizure disorder with concern for breakthrough seizure: Brought to [facility] ED due to tremoring of bilateral upper extremities, crying out when approached and touched.  Reportedly postictal when she arrived in ED.  It has she has received benzo and route to ED.  UDS is positive for benzo.  Had lactic acidosis to 3.5.  Also hypotensive.  CT head without acute finding.  UA with pyuria.  Continuous EEG without seizure or epileptiform discharge so far. -Appreciate  input by neurology: -Long-term EEG -Lorazepam 1 to 2 mg for further seizure activity -continue Vimpat 100mg  BID and VPA 135mg  BID. -Maintain seizure precautions -Treatment of UTI per primary team -Consider psych consult for management of persistent agitation -Neurology will continue to follow -Keppra discontinued likely due to behavioral  disturbance/agitation  Possible UTI: Patient is nonverbal.  Solely based on urine studies.  Has no fever or leukocytosis.  Urine culture with GNR. -Continue ceftriaxone pending speciation and sensitivity  AKI: Baseline Cr ~0.6-0.7.  Likely due to hypotension.  Probable dehydration.  Resume. Recent Labs    05/26/22 1132 07/13/22 0954 07/14/22 0417 01/26/23 1555 01/26/23 1626 01/27/23 1452  BUN 23 17 15 13 13 9   CREATININE 0.71 0.78 0.63 1.08* 1.10* 0.72  -Decrease IV fluid -Monitor  Hypotension: Resolved. -Continue IV fluid   History of aneurysm and SAH/ICH s/p craniotomy.  Bedbound and nonverbal. Dysphagia with PEG tube dependence -N.p.o. -Nutrition and meds via PEG tube.  Resume tube feed. -Dietitian consulted.  Chronic blood loss anemia due to abnormal uterine bleed and thickened endometrium: No obvious bleeding.  Drop in Hgb likely dilutional from IV fluid. Recent Labs    05/26/22 1132 07/13/22 0954 07/13/22 1805 07/13/22 2310 07/14/22 0417 01/26/23 1555 01/26/23 1626 01/27/23 0524  HGB 11.3* 5.6* 9.2* 8.5* 8.4* 9.0* 12.9 7.7*  -Check anemia panel -On Depo-Provera injection -Plan was for outpatient follow-up with gynecology when she was discharged in February 2024  Lactic acidosis: Multifactorial including possible seizure, hypotension or infection.  Resolving.   Advance care planning: Patient with comorbidity as above.  Still full code.  I really doubt if cardiopulmonary resuscitation is to the patient's best interest if we end up in the situation.  Attempted to call both daughters listed as emergency contact but no answer. -Palliative medicine consulted  Body mass index is 27.59 kg/m.           DVT prophylaxis:  heparin injection 5,000 Units Start: 01/27/23 1400  Code Status: Full code Family Communication: Attempted to call both daughters listed as emergency contact but no answer. Level of care: Telemetry Medical Status is: Inpatient Remains  inpatient appropriate because: Possible breakthrough seizure, UTI   Final disposition: SNF Consultants:  Neurology Palliative medicine  55 minutes with more than 50% spent in reviewing records, counseling patient/family and coordinating care.   Sch Meds:  Scheduled Meds:  Chlorhexidine Gluconate Cloth  6 each Topical Daily   feeding supplement  237 mL Oral 5 X Daily   heparin  5,000 Units Subcutaneous Q8H   mouth rinse  15 mL Mouth Rinse 4 times per day   pantoprazole (PROTONIX) IV  40 mg Intravenous Q24H   Continuous Infusions:  cefTRIAXone (ROCEPHIN)  IV Stopped (01/27/23 2311)   lacosamide (VIMPAT) IV 100 mg (01/28/23 1225)   lactated ringers 75 mL/hr at 01/28/23 1012   valproate sodium     PRN Meds:.acetaminophen **OR** acetaminophen, glycopyrrolate, hydrALAZINE, mouth rinse  Antimicrobials: Anti-infectives (From admission, onward)    Start     Dose/Rate Route Frequency Ordered Stop   01/26/23 2215  cefTRIAXone (ROCEPHIN) 1 g in sodium chloride 0.9 % 100 mL IVPB        1 g 200 mL/hr over 30 Minutes Intravenous Daily at bedtime 01/26/23 2202          I have personally reviewed the following labs and images: CBC: Recent Labs  Lab 01/26/23 1555 01/26/23 1626 01/27/23 0524  WBC 6.5  --  5.1  NEUTROABS 4.9  --   --  HGB 9.0* 12.9 7.7*  HCT 31.6* 38.0 26.4*  MCV 73.3*  --  71.4*  PLT 201  --  185   BMP &GFR Recent Labs  Lab 01/26/23 1555 01/26/23 1626 01/27/23 1452  NA 136 141 138  K 3.8 3.7 3.5  CL 101 105 106  CO2 24  --  23  GLUCOSE 114* 105* 87  BUN 13 13 9   CREATININE 1.08* 1.10* 0.72  CALCIUM 10.7*  --  10.3  MG 1.9  --   --    Estimated Creatinine Clearance: 79.3 mL/min (by C-G formula based on SCr of 0.72 mg/dL). Liver & Pancreas: Recent Labs  Lab 01/26/23 1555  AST 26  ALT 21  ALKPHOS 76  BILITOT 0.4  PROT 7.9  ALBUMIN 3.4*   No results for input(s): "LIPASE", "AMYLASE" in the last 168 hours. No results for input(s): "AMMONIA"  in the last 168 hours. Diabetic: No results for input(s): "HGBA1C" in the last 72 hours. Recent Labs  Lab 01/26/23 1553  GLUCAP 112*   Cardiac Enzymes: No results for input(s): "CKTOTAL", "CKMB", "CKMBINDEX", "TROPONINI" in the last 168 hours. No results for input(s): "PROBNP" in the last 8760 hours. Coagulation Profile: Recent Labs  Lab 01/26/23 1555  INR 1.1   Thyroid Function Tests: No results for input(s): "TSH", "T4TOTAL", "FREET4", "T3FREE", "THYROIDAB" in the last 72 hours. Lipid Profile: No results for input(s): "CHOL", "HDL", "LDLCALC", "TRIG", "CHOLHDL", "LDLDIRECT" in the last 72 hours. Anemia Panel: No results for input(s): "VITAMINB12", "FOLATE", "FERRITIN", "TIBC", "IRON", "RETICCTPCT" in the last 72 hours. Urine analysis:    Component Value Date/Time   COLORURINE YELLOW 01/26/2023 1804   APPEARANCEUR CLOUDY (A) 01/26/2023 1804   LABSPEC 1.009 01/26/2023 1804   PHURINE 7.0 01/26/2023 1804   GLUCOSEU NEGATIVE 01/26/2023 1804   HGBUR SMALL (A) 01/26/2023 1804   BILIRUBINUR NEGATIVE 01/26/2023 1804   KETONESUR NEGATIVE 01/26/2023 1804   PROTEINUR 30 (A) 01/26/2023 1804   NITRITE NEGATIVE 01/26/2023 1804   LEUKOCYTESUR LARGE (A) 01/26/2023 1804   Sepsis Labs: Invalid input(s): "PROCALCITONIN", "LACTICIDVEN"  Microbiology: Recent Results (from the past 240 hour(s))  Blood Culture (routine x 2)     Status: None (Preliminary result)   Collection Time: 01/26/23  5:03 PM   Specimen: BLOOD  Result Value Ref Range Status   Specimen Description BLOOD LEFT ANTECUBITAL  Final   Special Requests   Final    BOTTLES DRAWN AEROBIC ONLY Blood Culture adequate volume   Culture   Final    NO GROWTH 2 DAYS Performed at [facility], [address], [place], [place] [address]    Report Status PENDING  Incomplete  Blood Culture (routine x 2)     Status: None (Preliminary result)   Collection Time: 01/26/23  5:11 PM   Specimen: BLOOD  Result Value Ref Range Status    Specimen Description BLOOD RIGHT ANTECUBITAL  Final   Special Requests   Final    BOTTLES DRAWN AEROBIC ONLY Blood Culture results may not be optimal due to an inadequate volume of blood received in culture bottles   Culture   Final    NO GROWTH 2 DAYS Performed at [facility], [address], [place], [place] [address]    Report Status PENDING  Incomplete  Resp panel by RT-PCR (RSV, Flu A&B, Covid) Anterior Nasal Swab     Status: None   Collection Time: 01/26/23  5:12 PM   Specimen: Anterior Nasal Swab  Result Value Ref Range Status  SARS Coronavirus 2 by RT PCR NEGATIVE NEGATIVE Final    Comment: (NOTE) SARS-CoV-2 target nucleic acids are NOT DETECTED.  The SARS-CoV-2 RNA is generally detectable in upper respiratory specimens during the acute phase of infection. The lowest concentration of SARS-CoV-2 viral copies this assay can detect is 138 copies/mL. A negative result does not preclude SARS-Cov-2 infection and should not be used as the sole basis for treatment or other patient management decisions. A negative result may occur with  improper specimen collection/handling, submission of specimen other than nasopharyngeal swab, presence of viral mutation(s) within the areas targeted by this assay, and inadequate number of viral copies(<138 copies/mL). A negative result must be combined with clinical observations, patient history, and epidemiological information. The expected result is Negative.  Fact Sheet for Patients:  BloggerCourse.com  Fact Sheet for Healthcare Providers:  SeriousBroker.it  This test is no t yet approved or cleared by the [place] FDA and  has been authorized for detection and/or diagnosis of SARS-CoV-2 by FDA under an Emergency Use Authorization (EUA). This EUA will remain  in effect (meaning this test can be used) for the duration of the COVID-19 declaration under Section 564(b)(1) of the Act,  21 U.S.C.section 360bbb-3(b)(1), unless the authorization is terminated  or revoked sooner.       Influenza A by PCR NEGATIVE NEGATIVE Final   Influenza B by PCR NEGATIVE NEGATIVE Final    Comment: (NOTE) The Xpert Xpress SARS-CoV-2/FLU/RSV plus assay is intended as an aid in the diagnosis of influenza from Nasopharyngeal swab specimens and should not be used as a sole basis for treatment. Nasal washings and aspirates are unacceptable for Xpert Xpress SARS-CoV-2/FLU/RSV testing.  Fact Sheet for Patients: BloggerCourse.com  Fact Sheet for Healthcare Providers: SeriousBroker.it  This test is not yet approved or cleared by the [place] FDA and has been authorized for detection and/or diagnosis of SARS-CoV-2 by FDA under an Emergency Use Authorization (EUA). This EUA will remain in effect (meaning this test can be used) for the duration of the COVID-19 declaration under Section 564(b)(1) of the Act, 21 U.S.C. section 360bbb-3(b)(1), unless the authorization is terminated or revoked.     Resp Syncytial Virus by PCR NEGATIVE NEGATIVE Final    Comment: (NOTE) Fact Sheet for Patients: BloggerCourse.com  Fact Sheet for Healthcare Providers: SeriousBroker.it  This test is not yet approved or cleared by the [place] FDA and has been authorized for detection and/or diagnosis of SARS-CoV-2 by FDA under an Emergency Use Authorization (EUA). This EUA will remain in effect (meaning this test can be used) for the duration of the COVID-19 declaration under Section 564(b)(1) of the Act, 21 U.S.C. section 360bbb-3(b)(1), unless the authorization is terminated or revoked.  Performed at [facility], [address], [place], [place] [address]   Urine Culture     Status: Abnormal (Preliminary result)   Collection Time: 01/26/23  6:04 PM   Specimen: Urine, Random  Result Value Ref  Range Status   Specimen Description   Final    URINE, RANDOM Performed at [facility], [address], [place], [place] [address]    Special Requests   Final    NONE Performed at [facility], [address], [place], [place] [address]    Culture (A)  Final    >=100,000 COLONIES/mL GRAM NEGATIVE RODS SUSCEPTIBILITIES TO FOLLOW Performed at [facility], [address] [place], [place] [address]    Report Status PENDING  Incomplete    Radiology Studies: Overnight  EEG with video  Result Date: 01/28/2023 Charlsie Quest, MD     01/28/2023  7:39 AM Patient Name: Denise Savage MRN: [id number] Epilepsy Attending: Charlsie Quest Referring Physician/Provider: Jefferson Fuel, MD Duration: 01/27/2023 1807 to 01/28/2023 0730 Patient history: 62 year old patient with history of seizures, previous hemorrhagic stroke with right-sided hemiplegia and aphasia, subarachnoid hemorrhage who presents with an episode of unresponsiveness and seizure-like activity. On exam, she is noted to have tremoring of bilateral upper extremities and is nonverbal, crying out when approached or touched.  She cannot follow commands but will occasionally mimic. EEG to evaluate for seizure. Level of alertness: awake/lethargic AEDs during EEG study: LCM, VPA Technical aspects: This EEG study was done with scalp electrodes positioned according to the 10-20 International system of electrode placement. Electrical activity was reviewed with band pass filter of 1-70Hz , sensitivity of 7 uV/mm, display speed of 29mm/sec with a 60Hz  notched filter applied as appropriate. EEG data were recorded continuously and digitally stored.  Video monitoring was available and reviewed as appropriate. Description: The posterior dominant rhythm consists of 7 Hz activity of moderate voltage (25-35 uV) seen predominantly in posterior head regions, symmetric and reactive to eye opening and eye closing. EEG showed continuous generalized 3 to 6 Hz  theta-delta slowing. There was also sharply contoured 5-7z theta slowing in left frontal region consistent with craniotomy. Hyperventilation and photic stimulation were not performed.   Around 2040 on 01/27/2023, patient was noted to have right upper extremity tremor like activity. She was then noted to be crying followed by extension of right arm. Concomitant eeg before, during and after the event didn't show any eeg change to suggest seizure, ABNORMALITY - Breach artifact, left frontal region - Continuous slow, generalized IMPRESSION: This study is suggestive of cortical dysfunction arising from left frontal region likely consistent with underlying craniotomy. Additionally there is moderate diffuse encephalopathy. No seizures or definite epileptiform discharges were seen throughout the recording. Around 2040 on 01/27/2023, patient was noted to have right upper extremity tremor like activity. She was then noted to be crying followed by extension of right arm without concomitant eeg change. This was most likely not an epileptic event. However, focal motor seizure may not seen on scalp EEG.Therefore clinical correlation is recommended. Priyanka [name]      Brogan Martis T. Lyra Alaimo [facility]  If 7PM-7AM, please contact night-coverage [email or web address] 01/28/2023, 1:05 PM

## 2023-01-28 NOTE — Procedures (Signed)
Patient Name: Denise Savage  MRN: [id number]  Epilepsy Attending: Charlsie Quest  Referring Physician/Provider: Jefferson Fuel, MD  Duration: 01/27/2023 1807 to 01/28/2023 1807  Patient history: 62 year old patient with history of seizures, previous hemorrhagic stroke with right-sided hemiplegia and aphasia, subarachnoid hemorrhage who presents with an episode of unresponsiveness and seizure-like activity. On exam, she is noted to have tremoring of bilateral upper extremities and is nonverbal, crying out when approached or touched.  She cannot follow commands but will occasionally mimic. EEG to evaluate for seizure.  Level of alertness: awake/lethargic  AEDs during EEG study: LCM, VPA  Technical aspects: This EEG study was done with scalp electrodes positioned according to the 10-20 International system of electrode placement. Electrical activity was reviewed with band pass filter of 1-70Hz , sensitivity of 7 uV/mm, display speed of 59mm/sec with a 60Hz  notched filter applied as appropriate. EEG data were recorded continuously and digitally stored.  Video monitoring was available and reviewed as appropriate.  Description: The posterior dominant rhythm consists of 7 Hz activity of moderate voltage (25-35 uV) seen predominantly in posterior head regions, symmetric and reactive to eye opening and eye closing. EEG showed continuous generalized 3 to 6 Hz theta-delta slowing. There was also sharply contoured 5-7z theta slowing in left frontal region consistent with craniotomy. Hyperventilation and photic stimulation were not performed.     Around 2040 on 01/27/2023, patient was noted to have right upper extremity tremor like activity. She was then noted to be crying followed by extension of right arm. Concomitant eeg before, during and after the event didn't show any eeg change to suggest seizure,  ABNORMALITY - Breach artifact, left frontal region - Continuous slow, generalized  IMPRESSION: This  study is suggestive of cortical dysfunction arising from left frontal region likely consistent with underlying craniotomy. Additionally there is moderate diffuse encephalopathy. No seizures or definite epileptiform discharges were seen throughout the recording.  Around 2040 on 01/27/2023, patient was noted to have right upper extremity tremor like activity. She was then noted to be crying followed by extension of right arm without concomitant eeg change. This was most likely not an epileptic event. However, focal motor seizure may not seen on scalp EEG.Therefore clinical correlation is recommended.  Merick Kelleher [name]

## 2023-01-28 NOTE — Progress Notes (Signed)
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  She initially came to [facility] and was transferred here for continuous EEG.  At baseline, patient is nonverbal and dependent for ADLs.  She did have some tremoring of the left arm and leg as well as right gaze deviation upon arrival to [facility], but it is unclear if this was the seizure activity seen in her facility.  Subjective: Patient is nonverbal and cries out when approached, appears to be persistently agitated with and without stimuli  Exam: Vitals:   01/27/23 2319 01/28/23 0753  BP: (!) 117/100 (!) 147/75  Pulse: 76 92  Resp: 18 18  Temp: 98 F (36.7 C) 98.7 F (37.1 C)  SpO2: 100% 97%   Gen: In bed, crying out and agitated Resp: non-labored breathing on room air  Neuro: Mental Status: Patient rests with eyes open and cries out when approached by examiner.  She does not respond to name, is nonverbal, cannot follow commands but will occasionally mimic Cranial Nerves: Pupils round and reactive, tracks examiner around the room, face appears symmetrical, uncooperative with tongue protrusion Motor: Able to lift bilateral upper extremities off the bed, can move bilateral lower extremities and can lift them slightly off the bed Sensory: Appears intact to light touch throughout Gait: Deferred  Pertinent Labs:    Latest Ref Rng & Units 01/27/2023    5:24 AM 01/26/2023    4:26 PM 01/26/2023    3:55 PM  CBC  WBC 4.0 - 10.5 K/uL 5.1   6.5   Hemoglobin 12.0 - 15.0 g/dL 7.7  16.1  9.0   Hematocrit 36.0 - 46.0 % 26.4  38.0  31.6   Platelets 150 - 400 K/uL 185   201        Latest Ref Rng & Units 01/27/2023    2:52  PM 01/26/2023    4:26 PM 01/26/2023    3:55 PM  BMP  Glucose 70 - 99 mg/dL 87  096  045   BUN 8 - 23 mg/dL 9  13  13    Creatinine 0.44 - 1.00 mg/dL 4.09  8.11  9.14   Sodium 135 - 145 mmol/L 138  141  136   Potassium 3.5 - 5.1 mmol/L 3.5  3.7  3.8   Chloride 98 - 111 mmol/L 106  105  101   CO2 22 - 32 mmol/L 23   24   Calcium 8.9 - 10.3 mg/dL 78.2   95.6     Valproic acid level 17  Imaging Reviewed:  CT head: No acute abnormality, left cerebral encephalomalacia  EEG 8/17: Breach artifact in left frontal region, continuous slow generalized, moderate diffuse encephalopathy and no seizures or definite epileptiform discharges  Assessment: 62 year old patient with history of seizures, 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  On exam, she is noted to have tremoring of bilateral upper extremities and is nonverbal, crying out when approached or touched.  She cannot follow commands but will occasionally mimic.  Given history of seizures and report of seizure-like activity at nursing facility, it is reasonable to place her on long-term EEG.  She has also been treated for UTI by primary team, and infection could cause toxic metabolic encephalopathy mimicking seizure activity.  No seizures seen on long-term EEG, but will continue for another day to be sure.  As patient has persistent issues with agitation, even with no stimuli are present, she should not be on Keppra as this can increase agitation.  Will increase Depakote, and this medication provides both antiepileptic activity and can be beneficial in the setting of agitation.  Impression: Seizure-like activity in nonverbal patient with previous seizures  Recommendations: -Long-term EEG -Lorazepam 1 to  2 mg for further seizure activity -continue Vimpat 100mg  BID and increase Depakote to 125 mg every 6 hours (VPA level was subtherapeutic at 17 yesterday) -Avoid Keppra in the setting of persistent agitation -Maintain seizure precautions -Treatment of UTI per primary team -Consider psych consult for management of persistent agitation -Neurology will continue to follow  Cortney E Ernestina Columbia , MSN, AGACNP-BC [facility] See Amion for schedule and pager information 01/28/2023 10:45 AM

## 2023-01-29 DIAGNOSIS — R569 Unspecified convulsions: Secondary | ICD-10-CM | POA: Diagnosis not present

## 2023-01-29 DIAGNOSIS — R9389 Abnormal findings on diagnostic imaging of other specified body structures: Secondary | ICD-10-CM | POA: Diagnosis not present

## 2023-01-29 DIAGNOSIS — R4182 Altered mental status, unspecified: Secondary | ICD-10-CM

## 2023-01-29 DIAGNOSIS — N390 Urinary tract infection, site not specified: Secondary | ICD-10-CM | POA: Diagnosis not present

## 2023-01-29 DIAGNOSIS — I608 Other nontraumatic subarachnoid hemorrhage: Secondary | ICD-10-CM | POA: Diagnosis not present

## 2023-01-29 DIAGNOSIS — R451 Restlessness and agitation: Secondary | ICD-10-CM

## 2023-01-29 LAB — GLUCOSE, CAPILLARY
Glucose-Capillary: 123 mg/dL — ABNORMAL HIGH (ref 70–99)
Glucose-Capillary: 127 mg/dL — ABNORMAL HIGH (ref 70–99)
Glucose-Capillary: 136 mg/dL — ABNORMAL HIGH (ref 70–99)
Glucose-Capillary: 137 mg/dL — ABNORMAL HIGH (ref 70–99)

## 2023-01-29 LAB — CBC
HCT: 26.2 % — ABNORMAL LOW (ref 36.0–46.0)
Hemoglobin: 7.8 g/dL — ABNORMAL LOW (ref 12.0–15.0)
MCH: 21 pg — ABNORMAL LOW (ref 26.0–34.0)
MCHC: 29.8 g/dL — ABNORMAL LOW (ref 30.0–36.0)
MCV: 70.4 fL — ABNORMAL LOW (ref 80.0–100.0)
Platelets: 203 10*3/uL (ref 150–400)
RBC: 3.72 MIL/uL — ABNORMAL LOW (ref 3.87–5.11)
RDW: 19.3 % — ABNORMAL HIGH (ref 11.5–15.5)
WBC: 5.7 10*3/uL (ref 4.0–10.5)
nRBC: 0 % (ref 0.0–0.2)

## 2023-01-29 LAB — MAGNESIUM: Magnesium: 2.1 mg/dL (ref 1.7–2.4)

## 2023-01-29 LAB — BASIC METABOLIC PANEL
Anion gap: 10 (ref 5–15)
BUN: 5 mg/dL — ABNORMAL LOW (ref 8–23)
CO2: 18 mmol/L — ABNORMAL LOW (ref 22–32)
Calcium: 10.4 mg/dL — ABNORMAL HIGH (ref 8.9–10.3)
Chloride: 109 mmol/L (ref 98–111)
Creatinine, Ser: 0.79 mg/dL (ref 0.44–1.00)
GFR, Estimated: >60 mL/min (ref >60)
Glucose, Bld: 103 mg/dL — ABNORMAL HIGH (ref 70–99)
Potassium: 3.7 mmol/L (ref 3.5–5.1)
Sodium: 137 mmol/L (ref 135–145)

## 2023-01-29 LAB — RENAL FUNCTION PANEL
Albumin: 2.8 g/dL — ABNORMAL LOW (ref 3.5–5.0)
Anion gap: 10 (ref 5–15)
BUN: 7 mg/dL — ABNORMAL LOW (ref 8–23)
CO2: 20 mmol/L — ABNORMAL LOW (ref 22–32)
Calcium: 10 mg/dL (ref 8.9–10.3)
Chloride: 104 mmol/L (ref 98–111)
Creatinine, Ser: 0.69 mg/dL (ref 0.44–1.00)
GFR, Estimated: >60 mL/min (ref >60)
Glucose, Bld: 117 mg/dL — ABNORMAL HIGH (ref 70–99)
Phosphorus: 2 mg/dL — ABNORMAL LOW (ref 2.5–4.6)
Potassium: 3.3 mmol/L — ABNORMAL LOW (ref 3.5–5.1)
Sodium: 134 mmol/L — ABNORMAL LOW (ref 135–145)

## 2023-01-29 LAB — URINE CULTURE: Culture: >=100000 — AB

## 2023-01-29 MED ORDER — FOSFOMYCIN TROMETHAMINE 3 G PO PACK
3.0000 g | PACK | Freq: Once | ORAL | Status: DC
  Filled 2023-01-29: qty 3

## 2023-01-29 MED ORDER — POTASSIUM CHLORIDE 20 MEQ PO PACK
60.0000 meq | PACK | Freq: Once | ORAL | Status: AC
  Administered 2023-01-29: 60 meq
  Filled 2023-01-29: qty 3

## 2023-01-29 MED ORDER — PIPERACILLIN-TAZOBACTAM 3.375 G IVPB: 3.3750 g | Freq: Three times a day (TID) | INTRAVENOUS | Status: DC

## 2023-01-29 MED ORDER — QUETIAPINE FUMARATE 25 MG PO TABS
25.0000 mg | ORAL_TABLET | Freq: Two times a day (BID) | ORAL | Status: DC | PRN
  Administered 2023-01-29 – 2023-02-03 (×5): 25 mg
  Filled 2023-01-29 (×6): qty 1

## 2023-01-29 MED ORDER — HYDROXYZINE HCL 10 MG/5ML PO SYRP
25.0000 mg | ORAL_SOLUTION | Freq: Four times a day (QID) | ORAL | Status: DC | PRN
  Administered 2023-01-29 – 2023-01-30 (×3): 25 mg
  Filled 2023-01-29 (×5): qty 12.5

## 2023-01-29 MED ORDER — PIPERACILLIN-TAZOBACTAM 3.375 G IVPB
3.3750 g | Freq: Three times a day (TID) | INTRAVENOUS | Status: DC
  Filled 2023-01-29: qty 50

## 2023-01-29 MED ORDER — POTASSIUM PHOSPHATES 15 MMOLE/5ML IV SOLN
15.0000 mmol | Freq: Once | INTRAVENOUS | Status: AC
  Administered 2023-01-29: 15 mmol via INTRAVENOUS
  Filled 2023-01-29: qty 5

## 2023-01-29 MED ORDER — HYDROXYZINE HCL 25 MG PO TABS: 25.0000 mg | ORAL_TABLET | Freq: Four times a day (QID) | ORAL | Status: DC | PRN

## 2023-01-29 MED ORDER — PIPERACILLIN-TAZOBACTAM 3.375 G IVPB
3.3750 g | Freq: Three times a day (TID) | INTRAVENOUS | Status: AC
  Administered 2023-01-29 – 2023-01-31 (×6): 3.375 g via INTRAVENOUS
  Filled 2023-01-29 (×6): qty 50

## 2023-01-29 MED ORDER — SODIUM CHLORIDE 0.9 % IV SOLN
250.0000 mg | Freq: Every day | INTRAVENOUS | Status: AC
  Administered 2023-01-29 – 2023-01-30 (×2): 250 mg via INTRAVENOUS
  Filled 2023-01-29 (×2): qty 20

## 2023-01-29 NOTE — Progress Notes (Signed)
Patient ripped her foley catheter out, provider notified.

## 2023-01-29 NOTE — Progress Notes (Signed)
Daily Progress Note   Patient Name: BRANNON GILDER       Date: 01/29/2023 DOB: November 19, 1960  Age: 62 y.o. MRN#: [id number] Attending Physician: Almon Hercules, MD Primary Care Physician: Babs Sciara, MD Admit Date: 01/26/2023  Reason for Consultation/Follow-up: Establishing goals of care  Subjective: Patient is sleeping in NAD. No family at bedside.  Length of Stay: 3  Current Medications: Scheduled Meds:   Chlorhexidine Gluconate Cloth  6 each Topical Daily   feeding supplement (JEVITY 1.5 CAL/FIBER)  237 mL Per Tube 5 X Daily   free water  120 mL Per Tube 5 X Daily   heparin  5,000 Units Subcutaneous Q8H   mouth rinse  15 mL Mouth Rinse 4 times per day   pantoprazole (PROTONIX) IV  40 mg Intravenous Q24H   potassium chloride  60 mEq Per Tube Once    Continuous Infusions:  cefTRIAXone (ROCEPHIN)  IV Stopped (01/28/23 2305)   ferric gluconate (FERRLECIT) IVPB     lacosamide (VIMPAT) IV Stopped (01/28/23 2347)   potassium PHOSPHATE IVPB (in mmol)     valproate sodium 125 mg (01/29/23 0544)    PRN Meds: acetaminophen **OR** acetaminophen, glycopyrrolate, hydrOXYzine, labetalol, metoprolol tartrate, mouth rinse  Physical Exam Vitals reviewed.  Constitutional:      General: She is sleeping.  Pulmonary:     Effort: Pulmonary effort is normal.             Vital Signs: BP (!) 142/71   Pulse 79   Temp 99.6 F (37.6 C) (Oral)   Resp 19   Ht 5\' 7"  (1.702 m)   Wt 81.2 kg   SpO2 98%   BMI 28.04 kg/m  SpO2: SpO2: 98 % O2 Device: O2 Device: Room Air O2 Flow Rate:    Intake/output summary:  Intake/Output Summary (Last 24 hours) at 01/29/2023 0824 Last data filed at 01/29/2023 7846 Gross per 24 hour  Intake 796.6 ml  Output 1501 ml  Net -704.4 ml   LBM: Last BM Date  : 01/27/23 Baseline Weight: Weight: 87 kg Most recent weight: Weight: 81.2 kg       Palliative Assessment/Data: 20%      Patient Active Problem List   Diagnosis Date Noted   UTI (urinary tract infection) 01/27/2023   Severe anemia 07/13/2022   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  She initially came to [facility] and was transferred to [facility] for evaluation and continuous EEG.    Assessment: Extensive chart review has been completed prior to meeting with patient including labs, vital signs, imaging, progress/consult notes, orders, medications, and available advance directive documents.   Tremors were not present when assessing the patient.   9:20 Spoke with patient's daughter [name]. She was unable to visit yesterday as she has health conditions of her own. Gave her an update on her mother's condition and EEG results. Discussed her agitation during this hospitalization. Discussed code status again and that I was concerned that attempting CPR on her mother would likely result in poor outcomes. She has not spoken to her sister yet and is not prepared to change her mother's code status. We agreed  to talk again tomorrow.  Recommendations/Plan: Full code/full scope Continued conversation with patient's HCPOA Jacalyn Lefevre re: goals of care Continued PMT support  Code Status:    Code Status Orders  (From admission, onward)           Start     Ordered   01/26/23 2156  Full code  Continuous       Question:  By:  Answer:  Consent: discussion documented in EHR   01/26/23 2157          Care plan was discussed with Dr. Alanda Slim  Time spent: 50 minutes  Thank you for allowing the Palliative Medicine Team to assist in the care of this patient.   Sherryll Burger, NP  Please contact Palliative Medicine Team phone at [phone] for questions and concerns.

## 2023-01-29 NOTE — Progress Notes (Signed)
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  She initially came to [facility] and was transferred here for continuous EEG.  At baseline, patient is nonverbal and dependent for ADLs.  She did have some tremoring of the left arm and leg as well as right gaze deviation upon arrival to [facility], but it is unclear if this was the seizure activity seen in her facility.  Subjective: Patient is nonverbal and cries out when approached, appears to be persistently agitated with and without stimuli.  No changes from yesterday  Exam: Vitals:   01/29/23 0352 01/29/23 0747  BP: (!) 150/89 (!) 142/71  Pulse: (!) 101 79  Resp: 19   Temp: 100.1 F (37.8 C) 99.6 F (37.6 C)  SpO2: 100% 98%   Gen: In bed, crying out and agitated Resp: non-labored breathing on room air  Neuro: Mental Status: Patient rests with eyes open and cries out when approached by examiner.  She does not respond to name, is nonverbal, cannot follow commands but will occasionally mimic Cranial Nerves: Pupils round and reactive, tracks examiner around the room, face appears symmetrical, uncooperative with tongue protrusion Motor: Able to lift bilateral upper extremities off the bed, can move bilateral lower extremities and can lift them slightly off the bed Sensory: Appears intact to light touch throughout Gait: Deferred  Pertinent Labs:    Latest Ref Rng & Units 01/29/2023    4:56 AM 01/28/2023    5:16 PM 01/27/2023    5:24 AM  CBC  WBC 4.0 - 10.5 K/uL 5.7  5.5  5.1   Hemoglobin 12.0 - 15.0 g/dL 7.8  7.9  7.7   Hematocrit 36.0 - 46.0 % 26.2  27.2  26.4   Platelets 150 - 400 K/uL 203  191  185         Latest Ref Rng & Units 01/29/2023    4:56 AM 01/28/2023    5:10 PM 01/27/2023    2:52 PM  BMP  Glucose 70 - 99 mg/dL 696  295  87   BUN 8 - 23 mg/dL 7  5  9    Creatinine 0.44 - 1.00 mg/dL 2.84  1.32  4.40   Sodium 135 - 145 mmol/L 134  137  138   Potassium 3.5 - 5.1 mmol/L 3.3  3.7  3.5   Chloride 98 - 111 mmol/L 104  109  106   CO2 22 - 32 mmol/L 20  18  23    Calcium 8.9 - 10.3 mg/dL 10.2  72.5  36.6     Valproic acid level 17  Imaging Reviewed:  CT head: No acute abnormality, left cerebral encephalomalacia  EEG 8/17: Breach artifact in left frontal region, continuous slow generalized, moderate diffuse encephalopathy and no seizures or definite epileptiform discharges  Assessment: 62 year old patient with history of seizures, 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  On exam, she is noted to have tremoring of bilateral upper extremities and is nonverbal, crying out when approached or touched.  She cannot follow commands but will occasionally mimic.  Given history of seizures and report of seizure-like activity at nursing facility,  it is reasonable to place her on long-term EEG.  She has also been treated for UTI by primary team, and infection could cause toxic metabolic encephalopathy mimicking seizure activity.  No seizures seen on long-term EEG, but will continue for another day to be sure.  As patient has persistent issues with agitation, even with no stimuli are present, she should not be on Keppra as this can increase agitation.  Will increase Depakote, and this medication provides both antiepileptic activity and can be beneficial in the setting of agitation.  Long-term EEG for 24 hours demonstrates no seizure activity, so we will discontinue.   Suspect that patient's presentation was due to agitation and tremulousness in the setting of UTI rather than seizure activity.  Impression: Agitation and tremors and nonverbal patient, likely secondary to toxic metabolic encephalopathy in the setting of UTI  Recommendations: -Discontinue long-term EEG -Lorazepam 1 to 2 mg for further seizure activity -continue Vimpat 100mg  BID and continue Depakote to 125 mg every 6 hours (VPA level was subtherapeutic at 17 on admission) -Avoid Keppra in the setting of persistent agitation -Maintain seizure precautions -Treatment of UTI per primary team -Consider psych consult for management of persistent agitation -Neurology will sign off, please contact us with any questions or concerns  Cortney E Ernestina Columbia , MSN, AGACNP-BC [facility] See Amion for schedule and pager information 01/29/2023 9:37 AM   Attending Neurohospitalist Addendum Patient seen and examined with APP/Resident. Agree with the history and physical as documented above. Agree with the plan as documented, which I helped formulate. I have edited the note above to reflect my full findings and recommendations. I have independently reviewed the chart, obtained history, review of systems and examined the patient.I have personally reviewed pertinent head/neck/spine imaging (CT/MRI). Please feel free to call with any questions.  -- Bing Neighbors, MD [facility] [phone]  If 7pm- 7am, please page neurology on call as listed in AMION.

## 2023-01-29 NOTE — Progress Notes (Signed)
LTM EEG discontinued - no skin breakdown at unhook.   

## 2023-01-29 NOTE — Progress Notes (Signed)
PROGRESS NOTE  Denise Savage GNF:[id number] DOB: 1961/02/12   PCP: Babs Sciara, MD  Patient is from: Nursing home  DOA: 01/26/2023 LOS: 3  Chief complaints Chief Complaint  Patient presents with   Seizures     Brief Narrative / Interim history: 62 year old F with PMH of previous hemorrhagic stroke with right-sided hemiplegia, aphasia and dysphagia with tube feed dependence, subarachnoid hemorrhage, seizure disorder, anxiety, depression, GERD and embolic PE/paroxysmal A-fib not on anticoagulation due to blood loss anemia from abnormal uterine bleed brought to [facility] ED due to tremoring of bilateral upper extremities, crying out when approached and touched.  Patient is nonverbal.   On arrival to ED, reportedly postictal and hypotensive. Had lactic acidosis to 3.5.  Hypotension and lactic acidosis resolved quickly with IV fluid.  Foley catheter inserted at [facility] ED.  UA with pyuria. Cultures obtained and started on ceftriaxone.  CT head without acute finding.  Teleneurology consulted and recommended transfer to [facility] for continuous EEG.  Patient arrived at [facility] on 8/16.  Continuous EEG negative for seizure but suggests cortical dysfunction arising from left frontal region likely consistent with underlying craniotomy, and moderate diffuse encephalopathy.  Initially home Keppra was increased to 1000 mg twice daily but discontinued given concern for agitation/behavioral disturbance.  Currently on home Vimpat and Depakote.  Denies EEG ongoing.  Neurology following.  Palliative medicine consulted.  Remains full code with full scope of care.  Urine culture with ESBL E. coli but sensitive to Zosyn.  Antibiotic changed to IV Zosyn and instead of meropenem given seizure disorder.     Subjective: Seen and examined earlier this morning.  She is crying this morning but quite when distracted.  She pulled her Foley out this morning.   Objective: Vitals:   01/29/23 0352 01/29/23 0554 01/29/23 0747 01/29/23  1414  BP: (!) 150/89  (!) 142/71 (!) 147/68  Pulse: (!) 101  79 (!) 109  Resp: 19  19   Temp: 100.1 F (37.8 C)  99.6 F (37.6 C) (!) 100.4 F (38 C)  TempSrc: Oral  Oral Oral  SpO2: 100%  98% 99%  Weight:  81.2 kg    Height:        Examination:  GENERAL: Crying but quiet when distracted. HEENT: MMM.  Vision and hearing grossly intact.  NECK: Supple.  No apparent JVD.  RESP:  No IWOB.  Fair aeration bilaterally. CVS:  RRR. Heart sounds normal.  ABD/GI/GU: BS+. Abd soft, NTND.  Foley and G-tube in place. MSK/EXT: Moves upper extremities.  Does not move lower extremities.  Noted contractures seen legs. SKIN: no apparent skin lesion or wound NEURO: Awake and alert.  Crying but quite when distracted. Does not follow command.  Some twitching in left face.  Some shaking in left arm.  Lower extremity contractures and deformities. PSYCH: Calm.  No distress or agitation.  Procedures:  None  Microbiology summarized: COVID-19, influenza and RSV PCR nonreactive Blood cultures NGTD Urine culture with GNR  Assessment and plan: Principal Problem:   Seizures (HCC) Active Problems:   UTI (urinary tract infection)   Aneurysmal subarachnoid hemorrhage (HCC)   Thickened endometrium  Seizure disorder with concern for breakthrough seizure: Brought to [facility] ED due to tremoring of bilateral upper extremities, crying out when approached and touched.  Reportedly postictal when she arrived in ED.  It has she has received benzo and route to ED.  UDS is positive for benzo.  Had lactic acidosis to 3.5.  Also hypotensive.  CT  head without acute finding.  UA with pyuria.  VPA level subtherapeutic at 17 on admission.  Continuous EEG without seizure or epileptiform discharge so far.  Continuously crying but distractible.  -Appreciate input by neurology: -Discontinue long-term EEG -Lorazepam 1 to 2 mg for further seizure activity -continue Vimpat 100mg  BID and VPA 125mg  every 6 hours.  -Maintain seizure  precautions -Treatment of UTI per primary team -Consider psych consult for management of persistent agitation -Neurology will continue to follow -Keppra discontinued likely due to behavioral disturbance/agitation  Agitation: Continuously crying but distractible.  Per daughter, [name] this is normal for her especially when family members are not around.  His son usually visits when she was in [place].  He cannot drive to come to [facility]. -Reorientation and delirium precaution -P.o. Seroquel 25 mg twice daily as needed for agitation.  Discussed with [name] about this.  ESBL E. coli UTI: Patient is nonverbal.  Solely based on urine studies.  Has no fever or leukocytosis.  Urine culture with ESBL E. coli. -Discontinue ceftriaxone -IV Zosyn in instead of meropenem given seizure disorder.  Can complete 5 days course with p.o. fosfomycin  AKI: Baseline Cr ~0.6-0.7.  Likely due to hypotension.  Probable dehydration.  Resume. Recent Labs    05/26/22 1132 07/13/22 0954 07/14/22 0417 01/26/23 1555 01/26/23 1626 01/27/23 1452 01/28/23 1710 01/29/23 0456  BUN 23 17 15 13 13 9  5* 7*  CREATININE 0.71 0.78 0.63 1.08* 1.10* 0.72 0.79 0.69  -Monitor off IV fluid.   History of aneurysm and SAH/ICH s/p craniotomy.  Bedbound and nonverbal. Dysphagia with PEG tube dependence -N.p.o. -Appreciate help by dietitian with tube feed.  Iron deficiency and anemia due to chronic blood loss in the setting of abnormal uterine bleed and thickened endometrium: No obvious bleeding currently.  Some element of hemodilution.  Anemia panel with iron deficiency Recent Labs    05/26/22 1132 07/13/22 0954 07/13/22 1805 07/13/22 2310 07/14/22 0417 01/26/23 1555 01/26/23 1626 01/27/23 0524 01/28/23 1716 01/29/23 0456  HGB 11.3* 5.6* 9.2* 8.5* 8.4* 9.0* 12.9 7.7* 7.9* 7.8*  -IV ferric gluconate 250 mg daily x 2 -On Depo-Provera injection -Plan was for outpatient follow-up with gynecology when she was discharged  in February 2024  Advance care planning: Patient with comorbidity as above.  Still full code.  I really doubt if cardiopulmonary resuscitation is to the patient's best interest if we end up in the situation.  Attempted to call both daughters listed as emergency contact but no answer. -Palliative medicine consulted  Lactic acidosis: Multifactorial including possible seizure, hypotension or infection.  Resolving.  Hypotension: Resolved. -Continue IV fluid  Body mass index is 28.04 kg/m. Nutrition Problem: Inadequate oral intake Etiology: dysphagia Signs/Symptoms: NPO status (pt with chronic G-tube)     DVT prophylaxis:  heparin injection 5,000 Units Start: 01/27/23 1400  Code Status: Full code Family Communication: Updated patient's daughter, [name] over the phone. Level of care: Telemetry Medical Status is: Inpatient Remains inpatient appropriate because: Possible breakthrough seizure, agitation, UTI   Final disposition: SNF Consultants:  Neurology Palliative medicine  55 minutes with more than 50% spent in reviewing records, counseling patient/family and coordinating care.   Sch Meds:  Scheduled Meds:  Chlorhexidine Gluconate Cloth  6 each Topical Daily   feeding supplement (JEVITY 1.5 CAL/FIBER)  237 mL Per Tube 5 X Daily   [START ON 01/31/2023] fosfomycin  3 g Oral Once   free water  120 mL Per Tube 5 X Daily   heparin  5,000  Units Subcutaneous Q8H   mouth rinse  15 mL Mouth Rinse 4 times per day   pantoprazole (PROTONIX) IV  40 mg Intravenous Q24H   Continuous Infusions:  ferric gluconate (FERRLECIT) IVPB 250 mg (01/29/23 1315)   lacosamide (VIMPAT) IV 100 mg (01/29/23 1033)   piperacillin-tazobactam (ZOSYN)  IV     potassium PHOSPHATE IVPB (in mmol) 15 mmol (01/29/23 1127)   valproate sodium 125 mg (01/29/23 0544)   PRN Meds:.acetaminophen **OR** acetaminophen, glycopyrrolate, hydrOXYzine, labetalol, metoprolol tartrate, mouth rinse,  QUEtiapine  Antimicrobials: Anti-infectives (From admission, onward)    Start     Dose/Rate Route Frequency Ordered Stop   01/31/23 1500  fosfomycin (MONUROL) packet 3 g        3 g Oral  Once 01/29/23 1118     01/29/23 1145  piperacillin-tazobactam (ZOSYN) IVPB 3.375 g  Status:  Discontinued        3.375 g 12.5 mL/hr over 240 Minutes Intravenous Every 8 hours 01/29/23 1046 01/29/23 1046   01/29/23 1145  piperacillin-tazobactam (ZOSYN) IVPB 3.375 g  Status:  Discontinued        3.375 g 12.5 mL/hr over 240 Minutes Intravenous Every 8 hours 01/29/23 1057 01/29/23 1110   01/29/23 1145  piperacillin-tazobactam (ZOSYN) IVPB 3.375 g        3.375 g 12.5 mL/hr over 240 Minutes Intravenous Every 8 hours 01/29/23 1110 01/31/23 1359   01/26/23 2215  cefTRIAXone (ROCEPHIN) 1 g in sodium chloride 0.9 % 100 mL IVPB  Status:  Discontinued        1 g 200 mL/hr over 30 Minutes Intravenous Daily at bedtime 01/26/23 2202 01/29/23 1041        I have personally reviewed the following labs and images: CBC: Recent Labs  Lab 01/26/23 1555 01/26/23 1626 01/27/23 0524 01/28/23 1716 01/29/23 0456  WBC 6.5  --  5.1 5.5 5.7  NEUTROABS 4.9  --   --   --   --   HGB 9.0* 12.9 7.7* 7.9* 7.8*  HCT 31.6* 38.0 26.4* 27.2* 26.2*  MCV 73.3*  --  71.4* 69.4* 70.4*  PLT 201  --  185 191 203   BMP &GFR Recent Labs  Lab 01/26/23 1555 01/26/23 1626 01/27/23 1452 01/28/23 1710 01/28/23 1716 01/29/23 0456  NA 136 141 138 137  --  134*  K 3.8 3.7 3.5 3.7  --  3.3*  CL 101 105 106 109  --  104  CO2 24  --  23 18*  --  20*  GLUCOSE 114* 105* 87 103*  --  117*  BUN 13 13 9  5*  --  7*  CREATININE 1.08* 1.10* 0.72 0.79  --  0.69  CALCIUM 10.7*  --  10.3 10.4*  --  10.0  MG 1.9  --   --   --  1.4* 2.1  PHOS  --   --   --   --   --  2.0*   Estimated Creatinine Clearance: 79.9 mL/min (by C-G formula based on SCr of 0.69 mg/dL). Liver & Pancreas: Recent Labs  Lab 01/26/23 1555 01/29/23 0456  AST 26  --    ALT 21  --   ALKPHOS 76  --   BILITOT 0.4  --   PROT 7.9  --   ALBUMIN 3.4* 2.8*   No results for input(s): "LIPASE", "AMYLASE" in the last 168 hours. No results for input(s): "AMMONIA" in the last 168 hours. Diabetic: No results for input(s): "HGBA1C" in the last 72  hours. Recent Labs  Lab 01/26/23 1553 01/28/23 2211 01/29/23 0100 01/29/23 0615 01/29/23 1430  GLUCAP 112* 145* 137* 136* 123*   Cardiac Enzymes: No results for input(s): "CKTOTAL", "CKMB", "CKMBINDEX", "TROPONINI" in the last 168 hours. No results for input(s): "PROBNP" in the last 8760 hours. Coagulation Profile: Recent Labs  Lab 01/26/23 1555  INR 1.1   Thyroid Function Tests: No results for input(s): "TSH", "T4TOTAL", "FREET4", "T3FREE", "THYROIDAB" in the last 72 hours. Lipid Profile: No results for input(s): "CHOL", "HDL", "LDLCALC", "TRIG", "CHOLHDL", "LDLDIRECT" in the last 72 hours. Anemia Panel: Recent Labs    01/28/23 1716  VITAMINB12 810  FOLATE 25.5  FERRITIN 6*  TIBC 315  IRON 12*  RETICCTPCT 1.0   Urine analysis:    Component Value Date/Time   COLORURINE YELLOW 01/26/2023 1804   APPEARANCEUR CLOUDY (A) 01/26/2023 1804   LABSPEC 1.009 01/26/2023 1804   PHURINE 7.0 01/26/2023 1804   GLUCOSEU NEGATIVE 01/26/2023 1804   HGBUR SMALL (A) 01/26/2023 1804   BILIRUBINUR NEGATIVE 01/26/2023 1804   KETONESUR NEGATIVE 01/26/2023 1804   PROTEINUR 30 (A) 01/26/2023 1804   NITRITE NEGATIVE 01/26/2023 1804   LEUKOCYTESUR LARGE (A) 01/26/2023 1804   Sepsis Labs: Invalid input(s): "PROCALCITONIN", "LACTICIDVEN"  Microbiology: Recent Results (from the past 240 hour(s))  Blood Culture (routine x 2)     Status: None (Preliminary result)   Collection Time: 01/26/23  5:03 PM   Specimen: BLOOD  Result Value Ref Range Status   Specimen Description BLOOD LEFT ANTECUBITAL  Final   Special Requests   Final    BOTTLES DRAWN AEROBIC ONLY Blood Culture adequate volume   Culture   Final    NO  GROWTH 3 DAYS Performed at [facility], [address], [place], [place] [address]    Report Status PENDING  Incomplete  Blood Culture (routine x 2)     Status: None (Preliminary result)   Collection Time: 01/26/23  5:11 PM   Specimen: BLOOD  Result Value Ref Range Status   Specimen Description BLOOD RIGHT ANTECUBITAL  Final   Special Requests   Final    BOTTLES DRAWN AEROBIC ONLY Blood Culture results may not be optimal due to an inadequate volume of blood received in culture bottles   Culture   Final    NO GROWTH 3 DAYS Performed at [facility], [address] [place], [place] [address]    Report Status PENDING  Incomplete  Resp panel by RT-PCR (RSV, Flu A&B, Covid) Anterior Nasal Swab     Status: None   Collection Time: 01/26/23  5:12 PM   Specimen: Anterior Nasal Swab  Result Value Ref Range Status   SARS Coronavirus 2 by RT PCR NEGATIVE NEGATIVE Final    Comment: (NOTE) SARS-CoV-2 target nucleic acids are NOT DETECTED.  The SARS-CoV-2 RNA is generally detectable in upper respiratory specimens during the acute phase of infection. The lowest concentration of SARS-CoV-2 viral copies this assay can detect is 138 copies/mL. A negative result does not preclude SARS-Cov-2 infection and should not be used as the sole basis for treatment or other patient management decisions. A negative result may occur with  improper specimen collection/handling, submission of specimen other than nasopharyngeal swab, presence of viral mutation(s) within the areas targeted by this assay, and inadequate number of viral copies(<138 copies/mL). A negative result must be combined with clinical observations, patient history, and epidemiological information. The expected result is Negative.  Fact Sheet for Patients:  BloggerCourse.com  Fact Sheet for Healthcare Providers:  SeriousBroker.it  This test is no t yet approved or cleared by the [place] and  has been authorized for detection and/or diagnosis of SARS-CoV-2 by FDA under an Emergency Use Authorization (EUA). This EUA will remain  in effect (meaning this test can be used) for the duration of the COVID-19 declaration under Section 564(b)(1) of the Act, 21 U.S.C.section 360bbb-3(b)(1), unless the authorization is terminated  or revoked sooner.       Influenza A by PCR NEGATIVE NEGATIVE Final   Influenza B by PCR NEGATIVE NEGATIVE Final    Comment: (NOTE) The Xpert Xpress SARS-CoV-2/FLU/RSV plus assay is intended as an aid in the diagnosis of influenza from Nasopharyngeal swab specimens and should not be used as a sole basis for treatment. Nasal washings and aspirates are unacceptable for Xpert Xpress SARS-CoV-2/FLU/RSV testing.  Fact Sheet for Patients: BloggerCourse.com  Fact Sheet for Healthcare Providers: SeriousBroker.it  This test is not yet approved or cleared by the [place] FDA and has been authorized for detection and/or diagnosis of SARS-CoV-2 by FDA under an Emergency Use Authorization (EUA). This EUA will remain in effect (meaning this test can be used) for the duration of the COVID-19 declaration under Section 564(b)(1) of the Act, 21 U.S.C. section 360bbb-3(b)(1), unless the authorization is terminated or revoked.     Resp Syncytial Virus by PCR NEGATIVE NEGATIVE Final    Comment: (NOTE) Fact Sheet for Patients: BloggerCourse.com  Fact Sheet for Healthcare Providers: SeriousBroker.it  This test is not yet approved or cleared by the [place] FDA and has been authorized for detection and/or diagnosis of SARS-CoV-2 by FDA under an Emergency Use Authorization (EUA). This EUA will remain in effect (meaning this test can be used) for the duration of the COVID-19 declaration under Section 564(b)(1) of the Act, 21 U.S.C. section  360bbb-3(b)(1), unless the authorization is terminated or revoked.  Performed at [facility], [address] [place], [place] [address]   Urine Culture     Status: Abnormal   Collection Time: 01/26/23  6:04 PM   Specimen: Urine, Random  Result Value Ref Range Status   Specimen Description   Final    URINE, RANDOM Performed at [facility], [address], [place], [place] [address]    Special Requests   Final    NONE Performed at [facility], [address] [place], [place] [address]    Culture (A)  Final    >=100,000 COLONIES/mL ESCHERICHIA COLI Confirmed Extended Spectrum Beta-Lactamase Producer (ESBL).  In bloodstream infections from ESBL organisms, carbapenems are preferred over piperacillin/tazobactam. They are shown to have a lower risk of mortality.    Report Status 01/29/2023 FINAL  Final   Organism ID, Bacteria ESCHERICHIA COLI (A)  Final      Susceptibility   Escherichia coli - MIC*    AMPICILLIN >=32 RESISTANT Resistant     CEFAZOLIN >=64 RESISTANT Resistant     CEFEPIME 16 RESISTANT Resistant     CEFTRIAXONE >=64 RESISTANT Resistant     CIPROFLOXACIN >=4 RESISTANT Resistant     GENTAMICIN >=16 RESISTANT Resistant     IMIPENEM <=0.25 SENSITIVE Sensitive     NITROFURANTOIN <=16 SENSITIVE Sensitive     TRIMETH/SULFA <=20 SENSITIVE Sensitive     AMPICILLIN/SULBACTAM 8 SENSITIVE Sensitive     PIP/TAZO <=4 SENSITIVE Sensitive     * >=100,000 COLONIES/mL ESCHERICHIA COLI    Radiology Studies: No results found.    Helem Reesor T. Lovene Maret [facility]  If 7PM-7AM, please contact night-coverage  [email or web address] 01/29/2023, 2:49 PM

## 2023-01-29 NOTE — Progress Notes (Signed)
vLTM maintenance  All Impedances below 10kohms except F7 which is slightly higher.  No skin breakdown noted at   F7  FP1  CZ  F4

## 2023-01-29 NOTE — Plan of Care (Signed)

## 2023-01-29 NOTE — Procedures (Signed)
Patient Name: Denise Savage  MRN: [id number]  Epilepsy Attending: Charlsie Quest  Referring Physician/Provider: Jefferson Fuel, MD  Duration: 01/28/2023 1807 to 01/29/2023  1904   Patient history: 62 year old patient with history of seizures, previous hemorrhagic stroke with right-sided hemiplegia and aphasia, subarachnoid hemorrhage who presents with an episode of unresponsiveness and seizure-like activity. On exam, she is noted to have tremoring of bilateral upper extremities and is nonverbal, crying out when approached or touched.  She cannot follow commands but will occasionally mimic. EEG to evaluate for seizure.   Level of alertness: awake/lethargic   AEDs during EEG study: LCM, VPA   Technical aspects: This EEG study was done with scalp electrodes positioned according to the 10-20 International system of electrode placement. Electrical activity was reviewed with band pass filter of 1-70Hz , sensitivity of 7 uV/mm, display speed of 82mm/sec with a 60Hz  notched filter applied as appropriate. EEG data were recorded continuously and digitally stored.  Video monitoring was available and reviewed as appropriate.   Description: The posterior dominant rhythm consists of 7 Hz activity of moderate voltage (25-35 uV) seen predominantly in posterior head regions, symmetric and reactive to eye opening and eye closing. EEG showed continuous generalized 3 to 6 Hz theta-delta slowing. There was also sharply contoured 5-7z theta slowing in left frontal region consistent with craniotomy. Hyperventilation and photic stimulation were not performed.      ABNORMALITY - Breach artifact, left frontal region - Continuous slow, generalized   IMPRESSION: This study is suggestive of cortical dysfunction arising from left frontal region likely consistent with underlying craniotomy. Additionally there is moderate diffuse encephalopathy. No seizures or definite epileptiform discharges were seen throughout the  recording.  [name]

## 2023-01-30 ENCOUNTER — Inpatient Hospital Stay (HOSPITAL_COMMUNITY): Payer: Medicare Other

## 2023-01-30 DIAGNOSIS — I608 Other nontraumatic subarachnoid hemorrhage: Secondary | ICD-10-CM | POA: Diagnosis not present

## 2023-01-30 DIAGNOSIS — R569 Unspecified convulsions: Secondary | ICD-10-CM | POA: Diagnosis not present

## 2023-01-30 DIAGNOSIS — N390 Urinary tract infection, site not specified: Secondary | ICD-10-CM | POA: Diagnosis not present

## 2023-01-30 DIAGNOSIS — R9389 Abnormal findings on diagnostic imaging of other specified body structures: Secondary | ICD-10-CM | POA: Diagnosis not present

## 2023-01-30 LAB — GLUCOSE, CAPILLARY
Glucose-Capillary: 105 mg/dL — ABNORMAL HIGH (ref 70–99)
Glucose-Capillary: 126 mg/dL — ABNORMAL HIGH (ref 70–99)
Glucose-Capillary: 86 mg/dL (ref 70–99)

## 2023-01-30 LAB — CBC
HCT: 29.1 % — ABNORMAL LOW (ref 36.0–46.0)
Hemoglobin: 8.5 g/dL — ABNORMAL LOW (ref 12.0–15.0)
MCH: 20.5 pg — ABNORMAL LOW (ref 26.0–34.0)
MCHC: 29.2 g/dL — ABNORMAL LOW (ref 30.0–36.0)
MCV: 70.1 fL — ABNORMAL LOW (ref 80.0–100.0)
Platelets: 212 10*3/uL (ref 150–400)
RBC: 4.15 MIL/uL (ref 3.87–5.11)
RDW: 20.3 % — ABNORMAL HIGH (ref 11.5–15.5)
WBC: 8.9 10*3/uL (ref 4.0–10.5)
nRBC: 0.5 % — ABNORMAL HIGH (ref 0.0–0.2)

## 2023-01-30 LAB — RENAL FUNCTION PANEL
Albumin: 2.9 g/dL — ABNORMAL LOW (ref 3.5–5.0)
Anion gap: 11 (ref 5–15)
BUN: <5 mg/dL — ABNORMAL LOW (ref 8–23)
CO2: 19 mmol/L — ABNORMAL LOW (ref 22–32)
Calcium: 10.3 mg/dL (ref 8.9–10.3)
Chloride: 103 mmol/L (ref 98–111)
Creatinine, Ser: 1.08 mg/dL — ABNORMAL HIGH (ref 0.44–1.00)
GFR, Estimated: 58 mL/min — ABNORMAL LOW (ref >60)
Glucose, Bld: 113 mg/dL — ABNORMAL HIGH (ref 70–99)
Phosphorus: 1.9 mg/dL — ABNORMAL LOW (ref 2.5–4.6)
Potassium: 3.7 mmol/L (ref 3.5–5.1)
Sodium: 133 mmol/L — ABNORMAL LOW (ref 135–145)

## 2023-01-30 LAB — VALPROIC ACID LEVEL: Valproic Acid Lvl: 23 ug/mL — ABNORMAL LOW (ref 50.0–100.0)

## 2023-01-30 LAB — MAGNESIUM: Magnesium: 1.8 mg/dL (ref 1.7–2.4)

## 2023-01-30 MED ORDER — LORAZEPAM 2 MG/ML IJ SOLN
2.0000 mg | Freq: Once | INTRAMUSCULAR | Status: AC | PRN
  Administered 2023-01-30: 2 mg via INTRAVENOUS
  Filled 2023-01-30: qty 1

## 2023-01-30 MED ORDER — VALPROATE SODIUM 100 MG/ML IV SOLN
500.0000 mg | Freq: Three times a day (TID) | INTRAVENOUS | Status: DC
  Administered 2023-01-30 – 2023-01-31 (×2): 500 mg via INTRAVENOUS
  Filled 2023-01-30 (×2): qty 500
  Filled 2023-01-30 (×3): qty 5

## 2023-01-30 MED ORDER — SODIUM CHLORIDE 0.9 % IV SOLN
150.0000 mg | Freq: Two times a day (BID) | INTRAVENOUS | Status: DC
  Filled 2023-01-30: qty 15

## 2023-01-30 MED ORDER — SODIUM CHLORIDE 0.9 % IV SOLN
100.0000 mg | Freq: Two times a day (BID) | INTRAVENOUS | Status: DC
  Administered 2023-01-30 – 2023-02-02 (×6): 100 mg via INTRAVENOUS
  Filled 2023-01-30 (×7): qty 10

## 2023-01-30 MED ORDER — VALPROATE SODIUM 100 MG/ML IV SOLN
500.0000 mg | Freq: Three times a day (TID) | INTRAVENOUS | Status: DC
  Filled 2023-01-30 (×3): qty 5

## 2023-01-30 MED ORDER — VALPROATE SODIUM 100 MG/ML IV SOLN
1200.0000 mg | Freq: Once | INTRAVENOUS | Status: AC
  Administered 2023-01-30: 1200 mg via INTRAVENOUS
  Filled 2023-01-30: qty 5

## 2023-01-30 MED ORDER — BANATROL TF EN LIQD
60.0000 mL | Freq: Two times a day (BID) | ENTERAL | Status: DC
  Administered 2023-01-30 – 2023-02-02 (×7): 60 mL
  Filled 2023-01-30 (×7): qty 60

## 2023-01-30 MED ORDER — LORAZEPAM 2 MG/ML IJ SOLN
INTRAMUSCULAR | Status: AC
  Filled 2023-01-30: qty 1

## 2023-01-30 MED ORDER — SODIUM CHLORIDE 0.9 % IV BOLUS
1000.0000 mL | Freq: Once | INTRAVENOUS | Status: AC
  Administered 2023-01-30: 1000 mL via INTRAVENOUS

## 2023-01-30 MED ORDER — VALPROATE SODIUM 100 MG/ML IV SOLN
500.0000 mg | Freq: Four times a day (QID) | INTRAVENOUS | Status: DC
  Filled 2023-01-30 (×4): qty 5

## 2023-01-30 MED ORDER — POTASSIUM PHOSPHATES 15 MMOLE/5ML IV SOLN
30.0000 mmol | Freq: Once | INTRAVENOUS | Status: AC
  Administered 2023-01-30: 30 mmol via INTRAVENOUS
  Filled 2023-01-30: qty 10

## 2023-01-30 NOTE — Plan of Care (Signed)
Received secure chart notification from patient's RN about patient " is having a seizure".  When I arrived, patient is already postictal.  Patient's daughter at bedside.  Per RN, patient had brief tonic-clonic seizure with right gaze.  No medication administered.  Notified neurology via page and secure chat.  Added Ativan 2 mg as needed for seizure.

## 2023-01-30 NOTE — Significant Event (Signed)
Neuro NP in room assessing patient when patient started having another seizure. 2 mg ativan given IV. Will continue to monitor.

## 2023-01-30 NOTE — Progress Notes (Signed)
Brief Nutrition Support Note  RN reached out about the possibility of adding banatrol due to loose stools. Added BID. Will follow-up as planned to see if further interventions or a change in formula is warranted.   Greig Castilla, RD, LDN Clinical Dietitian RD pager # available in [facility]  After hours/weekend pager # available in [facility]\

## 2023-01-30 NOTE — Progress Notes (Addendum)
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  She initially came to [facility] and was transferred here for continuous EEG. She did have some tremoring of the left arm and leg as well as right gaze deviation upon arrival to [facility], but it is unclear if this was the seizure activity seen in her facility. At baseline, patient is nonverbal and dependent for ADLs.   Subjective: Called back to see patient today for witnessed seizure by family x 2.  On exam, the patient is yelling out, which is her baseline,l and moving all her extremities. While in the room patient suddenly became unresponsive, head turned to the right and began having a tonic clonic seizure lasting about 1 minute, for which she was given 2 mg IV ativan. She is now lethargic and seizure activity has stopped   Exam: Vitals:   01/30/23 0748 01/30/23 1140  BP: (!) 168/97 133/79  Pulse: 88 100  Resp: (!) 21 18  Temp: 99.9 F (37.7 C) 100 F (37.8 C)  SpO2: 99% 96%   Gen: In bed, crying out and agitated Resp: non-labored breathing on room air  Neuro: Mental Status: Patient rests with eyes open and cries out when approached by examiner.  She does not respond to name, is nonverbal, cannot follow commands but will occasionally mimic Cranial Nerves: Pupils round and reactive, tracks examiner around the room, face appears symmetrical, uncooperative with tongue protrusion Motor: Able to lift bilateral upper extremities off the bed, can move bilateral lower extremities and can lift them slightly off the bed Sensory: Grossly intact to light touch throughout Gait: Deferred  Pertinent Labs:    Latest Ref  Rng & Units 01/30/2023    8:52 AM 01/29/2023    4:56 AM 01/28/2023    5:16 PM  CBC  WBC 4.0 - 10.5 K/uL 8.9  5.7  5.5   Hemoglobin 12.0 - 15.0 g/dL 8.5  7.8  7.9   Hematocrit 36.0 - 46.0 % 29.1  26.2  27.2   Platelets 150 - 400 K/uL 212  203  191        Latest Ref Rng & Units 01/30/2023    8:52 AM 01/29/2023    4:56 AM 01/28/2023    5:10 PM  BMP  Glucose 70 - 99 mg/dL 841  324  401   BUN 8 - 23 mg/dL 5  7  5    Creatinine 0.44 - 1.00 mg/dL 0.27  2.53  6.64   Sodium 135 - 145 mmol/L 133  134  137   Potassium 3.5 - 5.1 mmol/L 3.7  3.3  3.7   Chloride 98 - 111 mmol/L 103  104  109   CO2 22 - 32 mmol/L 19  20  18    Calcium 8.9 - 10.3 mg/dL 40.3  47.4  25.9     Valproic acid level 17  Imaging Reviewed:  CT head: No acute abnormality, left cerebral encephalomalacia  EEG 8/17: Breach artifact in left frontal region, continuous slow generalized, moderate diffuse encephalopathy and no seizures or definite epileptiform discharges  Assessment: 62 year old patient with recurrent seizures.  - On exam, she is noted to have tremoring of bilateral upper extremities and is nonverbal, crying out when approached or touched.  She cannot follow  commands but will occasionally mimic. Seizure activity witnessed by Neurology NP appeared most consistent with an epileptic seizure rather than agitation.  - Given recurrent seizure activity, will need to restart LTM EEG.  - As patient has persistent issues with agitation, even when no stimuli are present, she should not be on Keppra as this can increase agitation.   - Will increase Depakote, and this medication provides both antiepileptic activity and can be beneficial in the setting of agitation.     Recommendations: - Maintain seizure precautions - Restarting LTM EEG - Lorazepam 1 to 2 mg IV for further seizure activity - Continue Vimpat at 100 mg IV BID  - Supplemental loading dose of Valproate 1200 mg IV and increase scheduled dose to 500 mg TID -  Treatment of UTI per primary team - Check VPA level tonight  - Daughter at bedside and updated her on plan of care   Gevena Mart DNP, ACNPC-AG  [facility]  Electronically signed: Dr. Caryl Pina

## 2023-01-30 NOTE — Care Management Important Message (Signed)
Important Message  Patient Details  Name: Denise Savage MRN: [id number] Date of Birth: 04/20/1961   Medicare Important Message Given:  Yes     Dorena Bodo 01/30/2023, 4:24 PM

## 2023-01-30 NOTE — Progress Notes (Signed)
Daily Progress Note   Patient Name: Denise Savage       Date: 01/30/2023 DOB: 08/09/1960  Age: 62 y.o. MRN#: [id number] Attending Physician: Almon Hercules, MD Primary Care Physician: Babs Sciara, MD Admit Date: 01/26/2023  Reason for Consultation/Follow-up: Establishing goals of care  Subjective: Patient is awake and calling out. RN at bedside. Patient's friend is at bedside.  Length of Stay: 4  Current Medications: Scheduled Meds:   Chlorhexidine Gluconate Cloth  6 each Topical Daily   feeding supplement (JEVITY 1.5 CAL/FIBER)  237 mL Per Tube 5 X Daily   [START ON 01/31/2023] fosfomycin  3 g Oral Once   free water  120 mL Per Tube 5 X Daily   heparin  5,000 Units Subcutaneous Q8H   mouth rinse  15 mL Mouth Rinse 4 times per day   pantoprazole (PROTONIX) IV  40 mg Intravenous Q24H    Continuous Infusions:  ferric gluconate (FERRLECIT) IVPB 250 mg (01/30/23 1010)   lacosamide (VIMPAT) IV 100 mg (01/30/23 1008)   piperacillin-tazobactam (ZOSYN)  IV 3.375 g (01/30/23 0515)   valproate sodium 125 mg (01/30/23 0618)    PRN Meds: acetaminophen **OR** acetaminophen, glycopyrrolate, hydrOXYzine, labetalol, metoprolol tartrate, mouth rinse, QUEtiapine  Physical Exam Vitals reviewed.  Constitutional:      General: She is sleeping.  Pulmonary:     Effort: Pulmonary effort is normal.             Vital Signs: BP (!) 168/97 (BP Location: Left Arm)   Pulse 88   Temp 99.9 F (37.7 C) (Axillary)   Resp (!) 21   Ht 5\' 7"  (1.702 m)   Wt 79.8 kg   SpO2 99%   BMI 27.55 kg/m  SpO2: SpO2: 99 % O2 Device: O2 Device: Room Air O2 Flow Rate:    Intake/output summary:  Intake/Output Summary (Last 24 hours) at 01/30/2023 1052 Last data filed at 01/30/2023 0950 Gross per 24 hour   Intake 830.65 ml  Output 1350 ml  Net -519.35 ml   LBM: Last BM Date : 01/27/23 Baseline Weight: Weight: 87 kg Most recent weight: Weight: 79.8 kg       Palliative Assessment/Data: 20%      Patient Active Problem List   Diagnosis Date Noted   UTI (urinary tract infection) 01/27/2023  Severe anemia 07/13/2022   Skull defect 03/23/2021   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  She initially came to [facility] and was transferred to [facility] for evaluation and continuous EEG.    Assessment: Extensive chart review has been completed prior to meeting with patient including labs, vital signs, imaging, progress/consult notes, orders, medications, and available advance directive documents.   The patient was awake and appeared agitated. The RN was at bedside administering medications and reports the patient has been quite active on his shift. A close friend was visiting.   11:20: Spoke to daughter [name]. There has been some disagreement in the family over code status in the past. Right now, the family is clear they want to keep the patient full code and full scope. Alica understands that there are evidenced-based poor  outcomes in similar hospitalized patients, as the cause of the arrest is likely associated with chronic/terminal disease rather than a reversible acute cardio-pulmonary event. Discussed my concern that attempting a resuscitation could put the patient in a worse condition.   Discussed the importance of continued conversation with family and the medical providers regarding overall plan of care and treatment options, ensuring decisions are within the context of the patient's values and GOCs. Questions and concerns were addressed. Encouraged the family to call with questions or concerns. They will reach out to PMT with any needs.  Recommendations/Plan: Full code/full scope Continued PMT support as needed  Code Status:    Code Status Orders  (From admission, onward)           Start     Ordered   01/26/23 2156  Full code  Continuous       Question:  By:  Answer:  Consent: discussion documented in EHR   01/26/23 2157          Care plan was discussed with bedside RN  Time spent: 50 minutes  Thank you for allowing the Palliative Medicine Team to assist in the care of this patient.   Sherryll Burger, NP  Please contact Palliative Medicine Team phone at [phone] for questions and concerns.

## 2023-01-30 NOTE — Progress Notes (Signed)
LTM EEG hooked up and running - no initial skin breakdown - push button tested - [facility] monitoring.  

## 2023-01-30 NOTE — Progress Notes (Signed)
PROGRESS NOTE  Denise Savage ZOX:[id number] DOB: 08/31/1960   PCP: Babs Sciara, MD  Patient is from: Nursing home  DOA: 01/26/2023 LOS: 4  Chief complaints Chief Complaint  Patient presents with   Seizures     Brief Narrative / Interim history: 62 year old F with PMH of previous hemorrhagic stroke with right-sided hemiplegia, aphasia and dysphagia with tube feed dependence, subarachnoid hemorrhage, seizure disorder, anxiety, depression, GERD and embolic PE/paroxysmal A-fib not on anticoagulation due to blood loss anemia from abnormal uterine bleed brought to [facility] ED due to tremoring of bilateral upper extremities, crying out when approached and touched.  Patient is nonverbal.   On arrival to ED, reportedly postictal and hypotensive. Had lactic acidosis to 3.5.  Hypotension and lactic acidosis resolved quickly with IV fluid.  Foley catheter inserted at [facility] ED.  UA with pyuria. Cultures obtained and started on ceftriaxone.  CT head without acute finding.  Teleneurology consulted and recommended transfer to [facility] for continuous EEG.  Patient arrived at [facility] on 8/16.  Continuous EEG negative for seizure but suggests cortical dysfunction arising from left frontal region likely consistent with underlying craniotomy, and moderate diffuse encephalopathy.  Initially home Keppra was increased to 1000 mg twice daily but discontinued given concern for agitation/behavioral disturbance.  Currently on home Vimpat and Depakote.  Denies EEG ongoing.  Neurology following.  Palliative medicine consulted.  Remains full code with full scope of care.  Urine culture with ESBL E. coli but sensitive to Zosyn.  Antibiotic changed to IV Zosyn and instead of meropenem given seizure disorder.  Likely discharge to SNF after p.o. fosfomycin on 8/20    Subjective: Seen and examined earlier this morning.  She is crying this morning but quite when distracted.  Does not appear to be in distress.  Objective: Vitals:    01/30/23 0329 01/30/23 0539 01/30/23 0748 01/30/23 1140  BP: 137/83  (!) 168/97 133/79  Pulse: 78  88 100  Resp: 18  (!) 21 18  Temp: 100.2 F (37.9 C)  99.9 F (37.7 C) 100 F (37.8 C)  TempSrc: Oral  Axillary Axillary  SpO2: 98%  99% 96%  Weight:  79.8 kg    Height:        Examination:  GENERAL: Crying but quiet when distracted. HEENT: MMM.  Vision and hearing grossly intact.  NECK: Supple.  No apparent JVD.  RESP:  No IWOB.  Fair aeration bilaterally. CVS:  RRR. Heart sounds normal.  ABD/GI/GU: BS+. Abd soft, NTND.  Foley and G-tube and Foley in place.  Clear looking urine. MSK/EXT: Some contractures and deformities in extremities.  SKIN: no apparent skin lesion or wound NEURO: Awake and alert.  Crying but quite when distracted. Does not follow command.   Lower extremity contractures and deformities. PSYCH: Calm.  No distress or agitation.  Procedures:  None  Microbiology summarized: COVID-19, influenza and RSV PCR nonreactive Blood cultures NGTD Urine culture with ESBL E. coli.  Assessment and plan: Principal Problem:   Seizures (HCC) Active Problems:   UTI (urinary tract infection)   Aneurysmal subarachnoid hemorrhage (HCC)   Thickened endometrium  Seizure disorder with concern for breakthrough seizure: Brought to [facility] ED due to tremoring of bilateral upper extremities, crying out when approached and touched.  Reportedly postictal when she arrived in ED.  It has she has received benzo and route to ED.  UDS is positive for benzo.  Had lactic acidosis to 3.5.  Also hypotensive.  CT head without acute finding.  UA  with pyuria.  VPA level subtherapeutic at 17 on admission.  Continuous EEG without seizure or epileptiform discharge so far.  Continuously crying but distractible.  -Appreciate input by neurology: -Discontinue long-term EEG -Lorazepam 1 to 2 mg for further seizure activity -continue Vimpat 100mg  BID and VPA 125mg  every 6 hours.  -Maintain seizure  precautions -Treatment of UTI per primary team -Consider psych consult for management of persistent agitation -Neurology will continue to follow -Keppra discontinued likely due to behavioral disturbance/agitation  Agitation: Continuously crying but distractible.  Per daughter, [name] this is normal for her especially when family members are not around.  His son usually visits when she was in [place].  He cannot drive to come to [facility]. -Reorientation and delirium precaution -P.o. Seroquel 25 mg twice daily as needed for agitation.  Discussed with Denise Savage about this.  ESBL E. coli UTI: Patient is nonverbal.  Solely based on urine studies.  Has no fever or leukocytosis.  Urine culture with ESBL E. coli. -Ceftriaxone 8/15>> IV Zosyn 8/18>> -Can complete 5 days course with p.o. fosfomycin on 8/20  AKI: Baseline Cr ~0.6-0.7.  Likely due to hypotension.  Probable dehydration.  Recent Labs    05/26/22 1132 07/13/22 0954 07/14/22 0417 01/26/23 1555 01/26/23 1626 01/27/23 1452 01/28/23 1710 01/29/23 0456 01/30/23 0852  BUN 23 17 15 13 13 9  5* 7* <5*  CREATININE 0.71 0.78 0.63 1.08* 1.10* 0.72 0.79 0.69 1.08*  -IV NS bolus 1 L x 1 -Recheck in the morning   History of aneurysm and SAH/ICH s/p craniotomy.  Bedbound and nonverbal. Dysphagia with PEG tube dependence -N.p.o. -Appreciate help by dietitian with tube feed.  Iron deficiency and anemia due to chronic blood loss in the setting of abnormal uterine bleed and thickened endometrium: No obvious bleeding currently.  Some element of hemodilution.  Anemia panel with iron deficiency Recent Labs    07/13/22 0954 07/13/22 1805 07/13/22 2310 07/14/22 0417 01/26/23 1555 01/26/23 1626 01/27/23 0524 01/28/23 1716 01/29/23 0456 01/30/23 0852  HGB 5.6* 9.2* 8.5* 8.4* 9.0* 12.9 7.7* 7.9* 7.8* 8.5*  -IV ferric gluconate 250 mg daily x 2.  Patient's condition makes it difficult for outpatient iron infusion. -On Depo-Provera injection -Plan  was for outpatient follow-up with gynecology when she was discharged in February 2024  Advance care planning: Patient with comorbidity as above.  Still full code.  I really doubt if cardiopulmonary resuscitation is to the patient's best interest if we end up in the situation.  Attempted to call both daughters listed as emergency contact but no answer. -Palliative medicine consulted-remains full code with full scope of care.  Lactic acidosis: Multifactorial including possible seizure, hypotension or infection.  Resolving.  Hypophosphatemia/hypokalemia -Monitor replenish as appropriate  Hypotension: Resolved.  Inadequate oral intake Body mass index is 27.55 kg/m. Nutrition Problem: Inadequate oral intake Etiology: dysphagia Signs/Symptoms: NPO status (pt with chronic G-tube)     DVT prophylaxis:  heparin injection 5,000 Units Start: 01/27/23 1400  Code Status: Full code Family Communication: Updated patient's daughter, [name] over the phone on 8/18.  None at bedside today. Level of care: Telemetry Medical Status is: Inpatient Remains inpatient appropriate because: Possible breakthrough seizure, agitation, UTI   Final disposition: SNF Consultants:  Neurology Palliative medicine  55 minutes with more than 50% spent in reviewing records, counseling patient/family and coordinating care.   Sch Meds:  Scheduled Meds:  Chlorhexidine Gluconate Cloth  6 each Topical Daily   feeding supplement (JEVITY 1.5 CAL/FIBER)  237 mL Per Tube 5 X  Daily   [START ON 01/31/2023] fosfomycin  3 g Oral Once   free water  120 mL Per Tube 5 X Daily   heparin  5,000 Units Subcutaneous Q8H   mouth rinse  15 mL Mouth Rinse 4 times per day   pantoprazole (PROTONIX) IV  40 mg Intravenous Q24H   Continuous Infusions:  lacosamide (VIMPAT) IV 100 mg (01/30/23 1008)   piperacillin-tazobactam (ZOSYN)  IV 3.375 g (01/30/23 0515)   potassium PHOSPHATE IVPB (in mmol)     sodium chloride     valproate sodium  125 mg (01/30/23 1102)   PRN Meds:.acetaminophen **OR** acetaminophen, glycopyrrolate, hydrOXYzine, labetalol, metoprolol tartrate, mouth rinse, QUEtiapine  Antimicrobials: Anti-infectives (From admission, onward)    Start     Dose/Rate Route Frequency Ordered Stop   01/31/23 1500  fosfomycin (MONUROL) packet 3 g        3 g Oral  Once 01/29/23 1118     01/29/23 1145  piperacillin-tazobactam (ZOSYN) IVPB 3.375 g  Status:  Discontinued        3.375 g 12.5 mL/hr over 240 Minutes Intravenous Every 8 hours 01/29/23 1046 01/29/23 1046   01/29/23 1145  piperacillin-tazobactam (ZOSYN) IVPB 3.375 g  Status:  Discontinued        3.375 g 12.5 mL/hr over 240 Minutes Intravenous Every 8 hours 01/29/23 1057 01/29/23 1110   01/29/23 1145  piperacillin-tazobactam (ZOSYN) IVPB 3.375 g        3.375 g 12.5 mL/hr over 240 Minutes Intravenous Every 8 hours 01/29/23 1110 01/31/23 1359   01/26/23 2215  cefTRIAXone (ROCEPHIN) 1 g in sodium chloride 0.9 % 100 mL IVPB  Status:  Discontinued        1 g 200 mL/hr over 30 Minutes Intravenous Daily at bedtime 01/26/23 2202 01/29/23 1041        I have personally reviewed the following labs and images: CBC: Recent Labs  Lab 01/26/23 1555 01/26/23 1626 01/27/23 0524 01/28/23 1716 01/29/23 0456 01/30/23 0852  WBC 6.5  --  5.1 5.5 5.7 8.9  NEUTROABS 4.9  --   --   --   --   --   HGB 9.0* 12.9 7.7* 7.9* 7.8* 8.5*  HCT 31.6* 38.0 26.4* 27.2* 26.2* 29.1*  MCV 73.3*  --  71.4* 69.4* 70.4* 70.1*  PLT 201  --  185 191 203 212   BMP &GFR Recent Labs  Lab 01/26/23 1555 01/26/23 1626 01/27/23 1452 01/28/23 1710 01/28/23 1716 01/29/23 0456 01/30/23 0852  NA 136 141 138 137  --  134* 133*  K 3.8 3.7 3.5 3.7  --  3.3* 3.7  CL 101 105 106 109  --  104 103  CO2 24  --  23 18*  --  20* 19*  GLUCOSE 114* 105* 87 103*  --  117* 113*  BUN 13 13 9  5*  --  7* <5*  CREATININE 1.08* 1.10* 0.72 0.79  --  0.69 1.08*  CALCIUM 10.7*  --  10.3 10.4*  --  10.0 10.3   MG 1.9  --   --   --  1.4* 2.1 1.8  PHOS  --   --   --   --   --  2.0* 1.9*   Estimated Creatinine Clearance: 58.7 mL/min (A) (by C-G formula based on SCr of 1.08 mg/dL (H)). Liver & Pancreas: Recent Labs  Lab 01/26/23 1555 01/29/23 0456 01/30/23 0852  AST 26  --   --   ALT 21  --   --  ALKPHOS 76  --   --   BILITOT 0.4  --   --   PROT 7.9  --   --   ALBUMIN 3.4* 2.8* 2.9*   No results for input(s): "LIPASE", "AMYLASE" in the last 168 hours. No results for input(s): "AMMONIA" in the last 168 hours. Diabetic: No results for input(s): "HGBA1C" in the last 72 hours. Recent Labs  Lab 01/29/23 0615 01/29/23 1430 01/29/23 2345 01/30/23 0612 01/30/23 1137  GLUCAP 136* 123* 127* 105* 126*   Cardiac Enzymes: No results for input(s): "CKTOTAL", "CKMB", "CKMBINDEX", "TROPONINI" in the last 168 hours. No results for input(s): "PROBNP" in the last 8760 hours. Coagulation Profile: Recent Labs  Lab 01/26/23 1555  INR 1.1   Thyroid Function Tests: No results for input(s): "TSH", "T4TOTAL", "FREET4", "T3FREE", "THYROIDAB" in the last 72 hours. Lipid Profile: No results for input(s): "CHOL", "HDL", "LDLCALC", "TRIG", "CHOLHDL", "LDLDIRECT" in the last 72 hours. Anemia Panel: Recent Labs    01/28/23 1716  VITAMINB12 810  FOLATE 25.5  FERRITIN 6*  TIBC 315  IRON 12*  RETICCTPCT 1.0   Urine analysis:    Component Value Date/Time   COLORURINE YELLOW 01/26/2023 1804   APPEARANCEUR CLOUDY (A) 01/26/2023 1804   LABSPEC 1.009 01/26/2023 1804   PHURINE 7.0 01/26/2023 1804   GLUCOSEU NEGATIVE 01/26/2023 1804   HGBUR SMALL (A) 01/26/2023 1804   BILIRUBINUR NEGATIVE 01/26/2023 1804   KETONESUR NEGATIVE 01/26/2023 1804   PROTEINUR 30 (A) 01/26/2023 1804   NITRITE NEGATIVE 01/26/2023 1804   LEUKOCYTESUR LARGE (A) 01/26/2023 1804   Sepsis Labs: Invalid input(s): "PROCALCITONIN", "LACTICIDVEN"  Microbiology: Recent Results (from the past 240 hour(s))  Blood Culture (routine  x 2)     Status: None (Preliminary result)   Collection Time: 01/26/23  5:03 PM   Specimen: BLOOD  Result Value Ref Range Status   Specimen Description BLOOD LEFT ANTECUBITAL  Final   Special Requests   Final    BOTTLES DRAWN AEROBIC ONLY Blood Culture adequate volume   Culture   Final    NO GROWTH 4 DAYS Performed at [facility], [address] [place], [place] [address]    Report Status PENDING  Incomplete  Blood Culture (routine x 2)     Status: None (Preliminary result)   Collection Time: 01/26/23  5:11 PM   Specimen: BLOOD  Result Value Ref Range Status   Specimen Description BLOOD RIGHT ANTECUBITAL  Final   Special Requests   Final    BOTTLES DRAWN AEROBIC ONLY Blood Culture results may not be optimal due to an inadequate volume of blood received in culture bottles   Culture   Final    NO GROWTH 4 DAYS Performed at [facility], [address], [place], [place] [address]    Report Status PENDING  Incomplete  Resp panel by RT-PCR (RSV, Flu A&B, Covid) Anterior Nasal Swab     Status: None   Collection Time: 01/26/23  5:12 PM   Specimen: Anterior Nasal Swab  Result Value Ref Range Status   SARS Coronavirus 2 by RT PCR NEGATIVE NEGATIVE Final    Comment: (NOTE) SARS-CoV-2 target nucleic acids are NOT DETECTED.  The SARS-CoV-2 RNA is generally detectable in upper respiratory specimens during the acute phase of infection. The lowest concentration of SARS-CoV-2 viral copies this assay can detect is 138 copies/mL. A negative result does not preclude SARS-Cov-2 infection and should not be used as the sole basis for treatment or other patient management decisions. A negative result  may occur with  improper specimen collection/handling, submission of specimen other than nasopharyngeal swab, presence of viral mutation(s) within the areas targeted by this assay, and inadequate number of viral copies(<138 copies/mL). A negative result must be combined with clinical observations,  patient history, and epidemiological information. The expected result is Negative.  Fact Sheet for Patients:  BloggerCourse.com  Fact Sheet for Healthcare Providers:  SeriousBroker.it  This test is no t yet approved or cleared by the [place] FDA and  has been authorized for detection and/or diagnosis of SARS-CoV-2 by FDA under an Emergency Use Authorization (EUA). This EUA will remain  in effect (meaning this test can be used) for the duration of the COVID-19 declaration under Section 564(b)(1) of the Act, 21 U.S.C.section 360bbb-3(b)(1), unless the authorization is terminated  or revoked sooner.       Influenza A by PCR NEGATIVE NEGATIVE Final   Influenza B by PCR NEGATIVE NEGATIVE Final    Comment: (NOTE) The Xpert Xpress SARS-CoV-2/FLU/RSV plus assay is intended as an aid in the diagnosis of influenza from Nasopharyngeal swab specimens and should not be used as a sole basis for treatment. Nasal washings and aspirates are unacceptable for Xpert Xpress SARS-CoV-2/FLU/RSV testing.  Fact Sheet for Patients: BloggerCourse.com  Fact Sheet for Healthcare Providers: SeriousBroker.it  This test is not yet approved or cleared by the [place] FDA and has been authorized for detection and/or diagnosis of SARS-CoV-2 by FDA under an Emergency Use Authorization (EUA). This EUA will remain in effect (meaning this test can be used) for the duration of the COVID-19 declaration under Section 564(b)(1) of the Act, 21 U.S.C. section 360bbb-3(b)(1), unless the authorization is terminated or revoked.     Resp Syncytial Virus by PCR NEGATIVE NEGATIVE Final    Comment: (NOTE) Fact Sheet for Patients: BloggerCourse.com  Fact Sheet for Healthcare Providers: SeriousBroker.it  This test is not yet approved or cleared by the [place] FDA and has been authorized for detection and/or diagnosis of SARS-CoV-2 by FDA under an Emergency Use Authorization (EUA). This EUA will remain in effect (meaning this test can be used) for the duration of the COVID-19 declaration under Section 564(b)(1) of the Act, 21 U.S.C. section 360bbb-3(b)(1), unless the authorization is terminated or revoked.  Performed at [facility], [address] [place], [place] [address]   Urine Culture     Status: Abnormal   Collection Time: 01/26/23  6:04 PM   Specimen: Urine, Random  Result Value Ref Range Status   Specimen Description   Final    URINE, RANDOM Performed at [facility], [address], [place], [place] [address]    Special Requests   Final    NONE Performed at [facility], [address], [place], [place] [address]    Culture (A)  Final    >=100,000 COLONIES/mL ESCHERICHIA COLI Confirmed Extended Spectrum Beta-Lactamase Producer (ESBL).  In bloodstream infections from ESBL organisms, carbapenems are preferred over piperacillin/tazobactam. They are shown to have a lower risk of mortality.    Report Status 01/29/2023 FINAL  Final   Organism ID, Bacteria ESCHERICHIA COLI (A)  Final      Susceptibility   Escherichia coli - MIC*    AMPICILLIN >=32 RESISTANT Resistant     CEFAZOLIN >=64 RESISTANT Resistant     CEFEPIME 16 RESISTANT Resistant     CEFTRIAXONE >=64 RESISTANT Resistant     CIPROFLOXACIN >=4 RESISTANT Resistant     GENTAMICIN >=16 RESISTANT Resistant     IMIPENEM <=0.25  SENSITIVE Sensitive     NITROFURANTOIN <=16 SENSITIVE Sensitive     TRIMETH/SULFA <=20 SENSITIVE Sensitive     AMPICILLIN/SULBACTAM 8 SENSITIVE Sensitive     PIP/TAZO <=4 SENSITIVE Sensitive     * >=100,000 COLONIES/mL ESCHERICHIA COLI    Radiology Studies: No results found.    Yury Schaus T. Lanyia Jewel [facility]  If 7PM-7AM, please contact night-coverage [email or web address] 01/30/2023, 12:52 PM

## 2023-01-31 DIAGNOSIS — R569 Unspecified convulsions: Secondary | ICD-10-CM | POA: Diagnosis not present

## 2023-01-31 DIAGNOSIS — R9389 Abnormal findings on diagnostic imaging of other specified body structures: Secondary | ICD-10-CM | POA: Diagnosis not present

## 2023-01-31 DIAGNOSIS — N390 Urinary tract infection, site not specified: Secondary | ICD-10-CM | POA: Diagnosis not present

## 2023-01-31 DIAGNOSIS — I608 Other nontraumatic subarachnoid hemorrhage: Secondary | ICD-10-CM | POA: Diagnosis not present

## 2023-01-31 LAB — GLUCOSE, CAPILLARY
Glucose-Capillary: 100 mg/dL — ABNORMAL HIGH (ref 70–99)
Glucose-Capillary: 108 mg/dL — ABNORMAL HIGH (ref 70–99)
Glucose-Capillary: 113 mg/dL — ABNORMAL HIGH (ref 70–99)
Glucose-Capillary: 85 mg/dL (ref 70–99)

## 2023-01-31 LAB — RENAL FUNCTION PANEL
Albumin: 3 g/dL — ABNORMAL LOW (ref 3.5–5.0)
Anion gap: 11 (ref 5–15)
BUN: 5 mg/dL — ABNORMAL LOW (ref 8–23)
CO2: 21 mmol/L — ABNORMAL LOW (ref 22–32)
Calcium: 10.8 mg/dL — ABNORMAL HIGH (ref 8.9–10.3)
Chloride: 103 mmol/L (ref 98–111)
Creatinine, Ser: 0.79 mg/dL (ref 0.44–1.00)
GFR, Estimated: >60 mL/min (ref >60)
Glucose, Bld: 112 mg/dL — ABNORMAL HIGH (ref 70–99)
Phosphorus: 2.1 mg/dL — ABNORMAL LOW (ref 2.5–4.6)
Potassium: 3.9 mmol/L (ref 3.5–5.1)
Sodium: 135 mmol/L (ref 135–145)

## 2023-01-31 LAB — CULTURE, BLOOD (ROUTINE X 2)
Culture: NO GROWTH
Culture: NO GROWTH
Special Requests: ADEQUATE

## 2023-01-31 LAB — CBC
HCT: 30.5 % — ABNORMAL LOW (ref 36.0–46.0)
Hemoglobin: 9.1 g/dL — ABNORMAL LOW (ref 12.0–15.0)
MCH: 21.2 pg — ABNORMAL LOW (ref 26.0–34.0)
MCHC: 29.8 g/dL — ABNORMAL LOW (ref 30.0–36.0)
MCV: 71.1 fL — ABNORMAL LOW (ref 80.0–100.0)
Platelets: 252 10*3/uL (ref 150–400)
RBC: 4.29 MIL/uL (ref 3.87–5.11)
RDW: 20.6 % — ABNORMAL HIGH (ref 11.5–15.5)
WBC: 7.9 10*3/uL (ref 4.0–10.5)
nRBC: 0.6 % — ABNORMAL HIGH (ref 0.0–0.2)

## 2023-01-31 LAB — MAGNESIUM: Magnesium: 1.9 mg/dL (ref 1.7–2.4)

## 2023-01-31 MED ORDER — VALPROATE SODIUM 100 MG/ML IV SOLN
750.0000 mg | Freq: Three times a day (TID) | INTRAVENOUS | Status: DC
  Administered 2023-01-31 – 2023-02-02 (×7): 750 mg via INTRAVENOUS
  Filled 2023-01-31 (×9): qty 7.5

## 2023-01-31 MED ORDER — FOSFOMYCIN TROMETHAMINE 3 G PO PACK
3.0000 g | PACK | Freq: Once | ORAL | Status: AC
  Administered 2023-01-31: 3 g
  Filled 2023-01-31: qty 3

## 2023-01-31 NOTE — Progress Notes (Signed)
PROGRESS NOTE  Denise Savage WGN:[id number] DOB: 1961/02/14   PCP: Babs Sciara, MD  Patient is from: Nursing home  DOA: 01/26/2023 LOS: 5  Chief complaints Chief Complaint  Patient presents with   Seizures     Brief Narrative / Interim history: 62 year old F with PMH of previous hemorrhagic stroke with right-sided hemiplegia, aphasia and dysphagia with tube feed dependence, subarachnoid hemorrhage, seizure disorder, anxiety, depression, GERD and embolic PE/paroxysmal A-fib not on anticoagulation due to blood loss anemia from abnormal uterine bleed brought to [facility] ED due to tremoring of bilateral upper extremities, crying out when approached and touched.  Patient is nonverbal.   On arrival to ED, reportedly postictal and hypotensive. Had lactic acidosis to 3.5.  Hypotension and lactic acidosis resolved quickly with IV fluid.  Foley catheter inserted at [facility] ED.  UA with pyuria. Cultures obtained and started on ceftriaxone.  CT head without acute finding.  Teleneurology consulted and recommended transfer to [facility] for continuous EEG.  Patient arrived at [facility] on 8/16.  Continuous EEG negative for seizure but suggests cortical dysfunction arising from left frontal region likely consistent with underlying craniotomy, and moderate diffuse encephalopathy.  Initially home Keppra was increased to 1000 mg twice daily but discontinued given concern for agitation/behavioral disturbance.  She was started on Vimpat and Depakote.  She had 2 brief episodes of witnessed seizure on 8/19.  She was loaded with Depakote followed by increased dose of Depakote.  LTM EEG unchanged from prior.  Neurology following.  Urine culture with ESBL E. coli but sensitive to Zosyn.  Antibiotic changed to IV Zosyn on 8/18 and completed course with p.o. fosfomycin on 8/20.   Palliative medicine consulted.  Remains full code with full scope of care    Subjective: Seen and examined earlier this morning.  No major events overnight  of this morning.  Patient is nonverbal.  Somewhat apprehensive and anxious to see strangers.  Intermittently cries which she does at baseline per family.  Objective: Vitals:   01/31/23 0346 01/31/23 0745 01/31/23 1144 01/31/23 1453  BP: 136/81 136/67 132/75 (!) 140/75  Pulse: 98 86 98 (!) 102  Resp: 18 17 19 19   Temp: 99.3 F (37.4 C) 98.6 F (37 C) 99.3 F (37.4 C) 99 F (37.2 C)  TempSrc: Oral Oral Axillary Axillary  SpO2: 100% 100% 100% 98%  Weight:      Height:        Examination:  GENERAL: Crying but quiet when distracted. HEENT: MMM.  Vision and hearing grossly intact.  NECK: Supple.  No apparent JVD.  RESP:  No IWOB.  Fair aeration bilaterally. CVS:  RRR. Heart sounds normal.  ABD/GI/GU: BS+. Abd soft, NTND.  Foley and G-tube and Foley in place.  Clear looking urine. MSK/EXT: Some contractures and deformities in extremities.  SKIN: no apparent skin lesion or wound NEURO: Awake and alert.  Crying but quite when distracted. Does not follow command.   Lower extremity contractures and deformities. PSYCH: Calm.  No distress or agitation.  Procedures:  None  Microbiology summarized: COVID-19, influenza and RSV PCR nonreactive Blood cultures NGTD Urine culture with ESBL E. coli.  Assessment and plan: Principal Problem:   Seizures (HCC) Active Problems:   UTI (urinary tract infection)   Aneurysmal subarachnoid hemorrhage (HCC)   Thickened endometrium  Seizure disorder with concern for breakthrough seizure: Brought to [facility] ED due to tremoring of bilateral upper extremities, crying out when approached and touched.  Reportedly postictal when she arrived in ED.  It has she has received benzo and route to ED.  UDS is positive for benzo.  Had lactic acidosis to 3.5. CT head without acute finding.  UA with pyuria.  VPA level subtherapeutic at 17 on admission.  Continuous EEG without seizure or epileptiform but cortical dysfunction as above.  Continuously crying but distractible  (chronic per family).  Patient had another 2 brief seizure episodes on 8/19.  Continuous EEG without new finding. -Appreciate input by neurology: -LTM EEG discontinued -Lorazepam 1 to 2 mg as needed for seizure -Continue Vimpat 100mg  BID -Increase Depakote to 750 mg every 8 hours -Maintain seizure precautions -Keppra discontinued due to behavioral disturbance/agitation  Agitation: Continuously crying but distractible.  Per daughter, [name] this is normal for her especially when family members are not around.  His son usually visits when she was in [place].  He cannot drive to come to [facility]. -Reorientation and delirium precaution -P.o. Seroquel 25 mg twice daily as needed for agitation.  Discussed with [name] about this.  ESBL E. coli UTI: Patient is nonverbal.  Solely based on urine studies.  Has no fever or leukocytosis.  Urine culture with ESBL E. coli. -Ceftriaxone 8/15>> IV Zosyn 8/18>> p.o. fosfomycin on 8/20  AKI: Baseline Cr ~0.6-0.7.  Likely due to hypotension.  Resolved. Recent Labs    05/26/22 1132 07/13/22 0954 07/14/22 0417 01/26/23 1555 01/26/23 1626 01/27/23 1452 01/28/23 1710 01/29/23 0456 01/30/23 0852 01/31/23 1032  BUN 23 17 15 13 13 9  5* 7* <5* 5*  CREATININE 0.71 0.78 0.63 1.08* 1.10* 0.72 0.79 0.69 1.08* 0.79  -Monitor intermittently   History of aneurysm and SAH/ICH s/p craniotomy.  Bedbound and nonverbal. Dysphagia with PEG tube dependence -N.p.o. -Appreciate help by dietitian with tube feed.  Iron deficiency and anemia due to chronic blood loss in the setting of abnormal uterine bleed and thickened endometrium: No obvious bleeding currently.  Some element of hemodilution.  Anemia panel with iron deficiency Recent Labs    07/13/22 1805 07/13/22 2310 07/14/22 0417 01/26/23 1555 01/26/23 1626 01/27/23 0524 01/28/23 1716 01/29/23 0456 01/30/23 0852 01/31/23 1032  HGB 9.2* 8.5* 8.4* 9.0* 12.9 7.7* 7.9* 7.8* 8.5* 9.1*  -IV ferric gluconate 250 mg  daily x 2.  Patient's condition makes it difficult for outpatient iron infusion. -On Depo-Provera injection -Plan was for outpatient follow-up with gynecology when she was discharged in February 2024  Advance care planning: Patient with comorbidity as above.  Still full code.  I really doubt if cardiopulmonary resuscitation is to the patient's best interest if we end up in the situation.  Attempted to call both daughters listed as emergency contact but no answer. -Palliative medicine consulted-remains full code with full scope of care.  Lactic acidosis: Multifactorial including possible seizure, hypotension or infection.  Resolving.  Hypophosphatemia/hypokalemia -Monitor replenish as appropriate  Hypotension: Resolved.  Inadequate oral intake Body mass index is 27.55 kg/m. Nutrition Problem: Inadequate oral intake Etiology: dysphagia Signs/Symptoms: NPO status (pt with chronic G-tube)     DVT prophylaxis:  heparin injection 5,000 Units Start: 01/27/23 1400  Code Status: Full code Family Communication: Updated patient's daughter at bedside on 8/19.  None at bedside today Level of care: Telemetry Medical Status is: Inpatient Remains inpatient appropriate because: Possible breakthrough seizure   Final disposition: SNF Consultants:  Neurology Palliative medicine  55 minutes with more than 50% spent in reviewing records, counseling patient/family and coordinating care.   Sch Meds:  Scheduled Meds:  Chlorhexidine Gluconate Cloth  6 each Topical Daily  feeding supplement (JEVITY 1.5 CAL/FIBER)  237 mL Per Tube 5 X Daily   fiber supplement (BANATROL TF)  60 mL Per Tube BID   free water  120 mL Per Tube 5 X Daily   heparin  5,000 Units Subcutaneous Q8H   mouth rinse  15 mL Mouth Rinse 4 times per day   pantoprazole (PROTONIX) IV  40 mg Intravenous Q24H   Continuous Infusions:  lacosamide (VIMPAT) IV 100 mg (01/31/23 1023)   valproate sodium     PRN Meds:.acetaminophen  **OR** acetaminophen, glycopyrrolate, hydrOXYzine, labetalol, metoprolol tartrate, mouth rinse, QUEtiapine  Antimicrobials: Anti-infectives (From admission, onward)    Start     Dose/Rate Route Frequency Ordered Stop   01/31/23 1500  fosfomycin (MONUROL) packet 3 g  Status:  Discontinued        3 g Oral  Once 01/29/23 1118 01/31/23 0807   01/31/23 1500  fosfomycin (MONUROL) packet 3 g        3 g Per Tube  Once 01/31/23 0807 01/31/23 1448   01/29/23 1145  piperacillin-tazobactam (ZOSYN) IVPB 3.375 g  Status:  Discontinued        3.375 g 12.5 mL/hr over 240 Minutes Intravenous Every 8 hours 01/29/23 1046 01/29/23 1046   01/29/23 1145  piperacillin-tazobactam (ZOSYN) IVPB 3.375 g  Status:  Discontinued        3.375 g 12.5 mL/hr over 240 Minutes Intravenous Every 8 hours 01/29/23 1057 01/29/23 1110   01/29/23 1145  piperacillin-tazobactam (ZOSYN) IVPB 3.375 g        3.375 g 12.5 mL/hr over 240 Minutes Intravenous Every 8 hours 01/29/23 1110 01/31/23 0941   01/26/23 2215  cefTRIAXone (ROCEPHIN) 1 g in sodium chloride 0.9 % 100 mL IVPB  Status:  Discontinued        1 g 200 mL/hr over 30 Minutes Intravenous Daily at bedtime 01/26/23 2202 01/29/23 1041        I have personally reviewed the following labs and images: CBC: Recent Labs  Lab 01/26/23 1555 01/26/23 1626 01/27/23 0524 01/28/23 1716 01/29/23 0456 01/30/23 0852 01/31/23 1032  WBC 6.5  --  5.1 5.5 5.7 8.9 7.9  NEUTROABS 4.9  --   --   --   --   --   --   HGB 9.0*   < > 7.7* 7.9* 7.8* 8.5* 9.1*  HCT 31.6*   < > 26.4* 27.2* 26.2* 29.1* 30.5*  MCV 73.3*  --  71.4* 69.4* 70.4* 70.1* 71.1*  PLT 201  --  185 191 203 212 252   < > = values in this interval not displayed.   BMP &GFR Recent Labs  Lab 01/26/23 1555 01/26/23 1626 01/27/23 1452 01/28/23 1710 01/28/23 1716 01/29/23 0456 01/30/23 0852 01/31/23 1032  NA 136   < > 138 137  --  134* 133* 135  K 3.8   < > 3.5 3.7  --  3.3* 3.7 3.9  CL 101   < > 106 109  --   104 103 103  CO2 24  --  23 18*  --  20* 19* 21*  GLUCOSE 114*   < > 87 103*  --  117* 113* 112*  BUN 13   < > 9 5*  --  7* <5* 5*  CREATININE 1.08*   < > 0.72 0.79  --  0.69 1.08* 0.79  CALCIUM 10.7*  --  10.3 10.4*  --  10.0 10.3 10.8*  MG 1.9  --   --   --  1.4* 2.1 1.8 1.9  PHOS  --   --   --   --   --  2.0* 1.9* 2.1*   < > = values in this interval not displayed.   Estimated Creatinine Clearance: 79.3 mL/min (by C-G formula based on SCr of 0.79 mg/dL). Liver & Pancreas: Recent Labs  Lab 01/26/23 1555 01/29/23 0456 01/30/23 0852 01/31/23 1032  AST 26  --   --   --   ALT 21  --   --   --   ALKPHOS 76  --   --   --   BILITOT 0.4  --   --   --   PROT 7.9  --   --   --   ALBUMIN 3.4* 2.8* 2.9* 3.0*   No results for input(s): "LIPASE", "AMYLASE" in the last 168 hours. No results for input(s): "AMMONIA" in the last 168 hours. Diabetic: No results for input(s): "HGBA1C" in the last 72 hours. Recent Labs  Lab 01/30/23 1137 01/30/23 1621 01/31/23 0026 01/31/23 0613 01/31/23 1143  GLUCAP 126* 86 100* 85 113*   Cardiac Enzymes: No results for input(s): "CKTOTAL", "CKMB", "CKMBINDEX", "TROPONINI" in the last 168 hours. No results for input(s): "PROBNP" in the last 8760 hours. Coagulation Profile: Recent Labs  Lab 01/26/23 1555  INR 1.1   Thyroid Function Tests: No results for input(s): "TSH", "T4TOTAL", "FREET4", "T3FREE", "THYROIDAB" in the last 72 hours. Lipid Profile: No results for input(s): "CHOL", "HDL", "LDLCALC", "TRIG", "CHOLHDL", "LDLDIRECT" in the last 72 hours. Anemia Panel: Recent Labs    01/28/23 1716  VITAMINB12 810  FOLATE 25.5  FERRITIN 6*  TIBC 315  IRON 12*  RETICCTPCT 1.0   Urine analysis:    Component Value Date/Time   COLORURINE YELLOW 01/26/2023 1804   APPEARANCEUR CLOUDY (A) 01/26/2023 1804   LABSPEC 1.009 01/26/2023 1804   PHURINE 7.0 01/26/2023 1804   GLUCOSEU NEGATIVE 01/26/2023 1804   HGBUR SMALL (A) 01/26/2023 1804    BILIRUBINUR NEGATIVE 01/26/2023 1804   KETONESUR NEGATIVE 01/26/2023 1804   PROTEINUR 30 (A) 01/26/2023 1804   NITRITE NEGATIVE 01/26/2023 1804   LEUKOCYTESUR LARGE (A) 01/26/2023 1804   Sepsis Labs: Invalid input(s): "PROCALCITONIN", "LACTICIDVEN"  Microbiology: Recent Results (from the past 240 hour(s))  Blood Culture (routine x 2)     Status: None   Collection Time: 01/26/23  5:03 PM   Specimen: BLOOD  Result Value Ref Range Status   Specimen Description BLOOD LEFT ANTECUBITAL  Final   Special Requests   Final    BOTTLES DRAWN AEROBIC ONLY Blood Culture adequate volume   Culture   Final    NO GROWTH 5 DAYS Performed at [facility], [address], [place], [place] [address]    Report Status 01/31/2023 FINAL  Final  Blood Culture (routine x 2)     Status: None   Collection Time: 01/26/23  5:11 PM   Specimen: BLOOD  Result Value Ref Range Status   Specimen Description BLOOD RIGHT ANTECUBITAL  Final   Special Requests   Final    BOTTLES DRAWN AEROBIC ONLY Blood Culture results may not be optimal due to an inadequate volume of blood received in culture bottles   Culture   Final    NO GROWTH 5 DAYS Performed at [facility], [address] [place], [place] [address]    Report Status 01/31/2023 FINAL  Final  Resp panel by RT-PCR (RSV, Flu A&B, Covid) Anterior Nasal Swab     Status: None  Collection Time: 01/26/23  5:12 PM   Specimen: Anterior Nasal Swab  Result Value Ref Range Status   SARS Coronavirus 2 by RT PCR NEGATIVE NEGATIVE Final    Comment: (NOTE) SARS-CoV-2 target nucleic acids are NOT DETECTED.  The SARS-CoV-2 RNA is generally detectable in upper respiratory specimens during the acute phase of infection. The lowest concentration of SARS-CoV-2 viral copies this assay can detect is 138 copies/mL. A negative result does not preclude SARS-Cov-2 infection and should not be used as the sole basis for treatment or other patient management decisions. A negative result  may occur with  improper specimen collection/handling, submission of specimen other than nasopharyngeal swab, presence of viral mutation(s) within the areas targeted by this assay, and inadequate number of viral copies(<138 copies/mL). A negative result must be combined with clinical observations, patient history, and epidemiological information. The expected result is Negative.  Fact Sheet for Patients:  BloggerCourse.com  Fact Sheet for Healthcare Providers:  SeriousBroker.it  This test is no t yet approved or cleared by the [place] FDA and  has been authorized for detection and/or diagnosis of SARS-CoV-2 by FDA under an Emergency Use Authorization (EUA). This EUA will remain  in effect (meaning this test can be used) for the duration of the COVID-19 declaration under Section 564(b)(1) of the Act, 21 U.S.C.section 360bbb-3(b)(1), unless the authorization is terminated  or revoked sooner.       Influenza A by PCR NEGATIVE NEGATIVE Final   Influenza B by PCR NEGATIVE NEGATIVE Final    Comment: (NOTE) The Xpert Xpress SARS-CoV-2/FLU/RSV plus assay is intended as an aid in the diagnosis of influenza from Nasopharyngeal swab specimens and should not be used as a sole basis for treatment. Nasal washings and aspirates are unacceptable for Xpert Xpress SARS-CoV-2/FLU/RSV testing.  Fact Sheet for Patients: BloggerCourse.com  Fact Sheet for Healthcare Providers: SeriousBroker.it  This test is not yet approved or cleared by the [place] FDA and has been authorized for detection and/or diagnosis of SARS-CoV-2 by FDA under an Emergency Use Authorization (EUA). This EUA will remain in effect (meaning this test can be used) for the duration of the COVID-19 declaration under Section 564(b)(1) of the Act, 21 U.S.C. section 360bbb-3(b)(1), unless the authorization is terminated  or revoked.     Resp Syncytial Virus by PCR NEGATIVE NEGATIVE Final    Comment: (NOTE) Fact Sheet for Patients: BloggerCourse.com  Fact Sheet for Healthcare Providers: SeriousBroker.it  This test is not yet approved or cleared by the [place] FDA and has been authorized for detection and/or diagnosis of SARS-CoV-2 by FDA under an Emergency Use Authorization (EUA). This EUA will remain in effect (meaning this test can be used) for the duration of the COVID-19 declaration under Section 564(b)(1) of the Act, 21 U.S.C. section 360bbb-3(b)(1), unless the authorization is terminated or revoked.  Performed at [facility], [address] [place], [place] [address]   Urine Culture     Status: Abnormal   Collection Time: 01/26/23  6:04 PM   Specimen: Urine, Random  Result Value Ref Range Status   Specimen Description   Final    URINE, RANDOM Performed at [facility], [address], [place], [place] [address]    Special Requests   Final    NONE Performed at [facility], [address] [place], [place] [address]    Culture (A)  Final    >=100,000 COLONIES/mL ESCHERICHIA COLI Confirmed Extended Spectrum Beta-Lactamase Producer (ESBL).  In bloodstream infections from ESBL organisms,  carbapenems are preferred over piperacillin/tazobactam. They are shown to have a lower risk of mortality.    Report Status 01/29/2023 FINAL  Final   Organism ID, Bacteria ESCHERICHIA COLI (A)  Final      Susceptibility   Escherichia coli - MIC*    AMPICILLIN >=32 RESISTANT Resistant     CEFAZOLIN >=64 RESISTANT Resistant     CEFEPIME 16 RESISTANT Resistant     CEFTRIAXONE >=64 RESISTANT Resistant     CIPROFLOXACIN >=4 RESISTANT Resistant     GENTAMICIN >=16 RESISTANT Resistant     IMIPENEM <=0.25 SENSITIVE Sensitive     NITROFURANTOIN <=16 SENSITIVE Sensitive     TRIMETH/SULFA <=20 SENSITIVE Sensitive     AMPICILLIN/SULBACTAM 8 SENSITIVE  Sensitive     PIP/TAZO <=4 SENSITIVE Sensitive     * >=100,000 COLONIES/mL ESCHERICHIA COLI    Radiology Studies: Overnight EEG with video  Result Date: 01/31/2023 Charlsie Quest, MD     01/31/2023 12:43 PM Patient Name: Denise Savage MRN: [id number] Epilepsy Attending: Charlsie Quest Referring Physician/Provider: Jefferson Fuel, MD Duration: 01/30/2023 1617 to 01/31/2023 1147  Patient history: 62 year old patient with history of seizures, previous hemorrhagic stroke with right-sided hemiplegia and aphasia, subarachnoid hemorrhage who presents with an episode of unresponsiveness and seizure-like activity. On exam, she is noted to have tremoring of bilateral upper extremities and is nonverbal, crying out when approached or touched.  She cannot follow commands but will occasionally mimic. EEG to evaluate for seizure.  Level of alertness: awake/lethargic  AEDs during EEG study: LCM, VPA  Technical aspects: This EEG study was done with scalp electrodes positioned according to the 10-20 International system of electrode placement. Electrical activity was reviewed with band pass filter of 1-70Hz , sensitivity of 7 uV/mm, display speed of 63mm/sec with a 60Hz  notched filter applied as appropriate. EEG data were recorded continuously and digitally stored.  Video monitoring was available and reviewed as appropriate.  Description: The posterior dominant rhythm consists of 7 Hz activity of moderate voltage (25-35 uV) seen predominantly in posterior head regions, symmetric and reactive to eye opening and eye closing. EEG showed continuous generalized 3 to 6 Hz theta-delta slowing. There was also sharply contoured 5-7z theta slowing in left frontal region consistent with craniotomy. Hyperventilation and photic stimulation were not performed.    ABNORMALITY - Breach artifact, left frontal region - Continuous slow, generalized and lateralized left hemisphere  IMPRESSION: This study is suggestive of cortical dysfunction  arising from left hemisphere maximal left frontal region likely consistent with underlying encephalomalacia and craniotomy. Additionally there is moderate diffuse encephalopathy. No seizures or definite epileptiform discharges were seen throughout the recording.  Priyanka [name]     Domnick Chervenak T. Floella Ensz [facility]  If 7PM-7AM, please contact night-coverage [email or web address] 01/31/2023, 2:58 PM

## 2023-01-31 NOTE — Plan of Care (Signed)
  Problem: Nutrition: Goal: Adequate nutrition will be maintained Outcome: Progressing   

## 2023-01-31 NOTE — Procedures (Addendum)
Patient Name: Denise Savage  MRN: [id number]  Epilepsy Attending: Charlsie Quest  Referring Physician/Provider: Jefferson Fuel, MD  Duration: 01/30/2023 1617 to 01/31/2023 1147   Patient history: 62 year old patient with history of seizures, previous hemorrhagic stroke with right-sided hemiplegia and aphasia, subarachnoid hemorrhage who presents with an episode of unresponsiveness and seizure-like activity. On exam, she is noted to have tremoring of bilateral upper extremities and is nonverbal, crying out when approached or touched.  She cannot follow commands but will occasionally mimic. EEG to evaluate for seizure.   Level of alertness: awake/lethargic   AEDs during EEG study: LCM, VPA   Technical aspects: This EEG study was done with scalp electrodes positioned according to the 10-20 International system of electrode placement. Electrical activity was reviewed with band pass filter of 1-70Hz , sensitivity of 7 uV/mm, display speed of 76mm/sec with a 60Hz  notched filter applied as appropriate. EEG data were recorded continuously and digitally stored.  Video monitoring was available and reviewed as appropriate.   Description: The posterior dominant rhythm consists of 7 Hz activity of moderate voltage (25-35 uV) seen predominantly in posterior head regions, symmetric and reactive to eye opening and eye closing. EEG showed continuous generalized 3 to 6 Hz theta-delta slowing. There was also sharply contoured 5-7z theta slowing in left frontal region consistent with craniotomy. Hyperventilation and photic stimulation were not performed.      ABNORMALITY - Breach artifact, left frontal region - Continuous slow, generalized and lateralized left hemisphere   IMPRESSION: This study is suggestive of cortical dysfunction arising from left hemisphere maximal left frontal region likely consistent with underlying encephalomalacia and craniotomy. Additionally there is moderate diffuse encephalopathy. No  seizures or definite epileptiform discharges were seen throughout the recording.   Bailyn Spackman [name]

## 2023-01-31 NOTE — Progress Notes (Signed)
LTM EEG discontinued - no skin breakdown at unhook.   

## 2023-01-31 NOTE — Plan of Care (Signed)
  Problem: Pain Managment: Goal: General experience of comfort will improve Outcome: Progressing   Problem: Safety: Goal: Ability to remain free from injury will improve Outcome: Progressing   Problem: Skin Integrity: Goal: Risk for impaired skin integrity will decrease Outcome: Progressing   

## 2023-01-31 NOTE — Progress Notes (Signed)
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  She initially came to [facility] and was transferred here for continuous EEG. She did have some tremoring of the left arm and leg as well as right gaze deviation upon arrival to [facility], but it is unclear if this was the same as the seizure activity seen in her facility. At baseline, patient is nonverbal and dependent for ADLs.   Subjective: No family at the bedside. Patient is laying in bed in NAD. She is calm with little agitation or crying when approached or stimulated.  VPA level is low at 23.  Currently on LTM. LTM read as cortical dysfunction arising from left hemisphere maximal left frontal region likely consistent with underlying encephalomalacia and craniotomy. Additionally there is moderate diffuse encephalopathy. No seizures or definite epileptiform discharges    Exam: Vitals:   01/31/23 0346 01/31/23 0745  BP: 136/81 136/67  Pulse: 98 86  Resp: 18 17  Temp: 99.3 F (37.4 C) 98.6 F (37 C)  SpO2: 100% 100%   Gen: In bed, calm in nad Resp: non-labored breathing on room air  Neuro: Mental Status: Patient has eyes open and is tracking examiner. She is calm with little agitation and crying out.  Cranial Nerves: Pupils round and reactive, tracks examiner around the room, face appears symmetrical, uncooperative with tongue protrusion Motor: Able to lift bilateral upper extremities off the bed, can move bilateral lower extremities and can lift them slightly off the bed Sensory: Grossly intact to light touch throughout Gait: Deferred  Pertinent Labs:    Latest Ref Rng & Units 01/30/2023    8:52 AM  01/29/2023    4:56 AM 01/28/2023    5:16 PM  CBC  WBC 4.0 - 10.5 K/uL 8.9  5.7  5.5   Hemoglobin 12.0 - 15.0 g/dL 8.5  7.8  7.9   Hematocrit 36.0 - 46.0 % 29.1  26.2  27.2   Platelets 150 - 400 K/uL 212  203  191        Latest Ref Rng & Units 01/30/2023    8:52 AM 01/29/2023    4:56 AM 01/28/2023    5:10 PM  BMP  Glucose 70 - 99 mg/dL 161  096  045   BUN 8 - 23 mg/dL 5  7  5    Creatinine 0.44 - 1.00 mg/dL 4.09  8.11  9.14   Sodium 135 - 145 mmol/L 133  134  137   Potassium 3.5 - 5.1 mmol/L 3.7  3.3  3.7   Chloride 98 - 111 mmol/L 103  104  109   CO2 22 - 32 mmol/L 19  20  18    Calcium 8.9 - 10.3 mg/dL 78.2  95.6  21.3     Valproic acid level 23  Imaging Reviewed:  CT head 8/15: No acute abnormality, left cerebral encephalomalacia  EEG 8/17: Breach artifact in left frontal region, continuous slow generalized, moderate diffuse encephalopathy and no seizures or definite epileptiform discharges  LTM 8/19-8/20: This study is suggestive of cortical dysfunction arising from left hemisphere maximal left frontal region likely consistent with underlying encephalomalacia and craniotomy. Additionally there is moderate diffuse encephalopathy. No seizures or definite epileptiform discharges were seen throughout the recording.   Assessment: 62 year old patient  with a history of seizures, previous left sided hemorrhagic stroke/subarachnoid hemorrhage with chronic residual right hemiplegia and aphasia, prior left craniotomy and aneurysm clipping, atrial fibrillation not on anticoagulation, PE, anxiety and depression who presented on 8/15 after an episode of unresponsiveness and seizure-like activity at her facility. A witnessed epileptic seizure observed by Neurology NP occurred during yesterday's (8/19) follow up visit.   - On exam today, she is calmer than yesterday, with little agitation or crying out. Exam findings are otherwise unchanged. - LTM EEG overnight showed no seizure recurrence. Slowing  at leads over the left hemisphere was again noted.  - As patient has persistent issues with agitation, even when no stimuli are present, she should not be on Keppra as this can increase agitation.   - Yesterday her Depakote was increased. This medication provides both antiepileptic activity and can be beneficial in the setting of agitation.     Recommendations: - Maintain seizure precautions - Will discontinue LTM EEG - Lorazepam 1 to 2 mg IV for further seizure activity - Continue Vimpat at 100 mg IV BID  - Increasing depakote to 750 mg Q8hr - Treatment of UTI per primary team   Gevena Mart DNP, ACNPC-AG  [facility]   Electronically signed: Dr. Caryl Pina

## 2023-02-01 ENCOUNTER — Inpatient Hospital Stay (HOSPITAL_COMMUNITY): Payer: Medicare Other

## 2023-02-01 DIAGNOSIS — N390 Urinary tract infection, site not specified: Secondary | ICD-10-CM | POA: Diagnosis not present

## 2023-02-01 DIAGNOSIS — R569 Unspecified convulsions: Secondary | ICD-10-CM | POA: Diagnosis not present

## 2023-02-01 DIAGNOSIS — R9389 Abnormal findings on diagnostic imaging of other specified body structures: Secondary | ICD-10-CM | POA: Diagnosis not present

## 2023-02-01 DIAGNOSIS — I608 Other nontraumatic subarachnoid hemorrhage: Secondary | ICD-10-CM | POA: Diagnosis not present

## 2023-02-01 LAB — CBC WITH DIFFERENTIAL/PLATELET
Abs Immature Granulocytes: 0.02 10*3/uL (ref 0.00–0.07)
Basophils Absolute: 0 10*3/uL (ref 0.0–0.1)
Basophils Relative: 0 %
Eosinophils Absolute: 0.1 10*3/uL (ref 0.0–0.5)
Eosinophils Relative: 2 %
HCT: 30 % — ABNORMAL LOW (ref 36.0–46.0)
Hemoglobin: 8.6 g/dL — ABNORMAL LOW (ref 12.0–15.0)
Immature Granulocytes: 0 %
Lymphocytes Relative: 27 %
Lymphs Abs: 1.7 10*3/uL (ref 0.7–4.0)
MCH: 20.7 pg — ABNORMAL LOW (ref 26.0–34.0)
MCHC: 28.7 g/dL — ABNORMAL LOW (ref 30.0–36.0)
MCV: 72.1 fL — ABNORMAL LOW (ref 80.0–100.0)
Monocytes Absolute: 0.7 10*3/uL (ref 0.1–1.0)
Monocytes Relative: 10 %
Neutro Abs: 3.8 10*3/uL (ref 1.7–7.7)
Neutrophils Relative %: 61 %
Platelets: 221 10*3/uL (ref 150–400)
RBC: 4.16 MIL/uL (ref 3.87–5.11)
RDW: 21.1 % — ABNORMAL HIGH (ref 11.5–15.5)
WBC: 6.3 10*3/uL (ref 4.0–10.5)
nRBC: 0 % (ref 0.0–0.2)

## 2023-02-01 LAB — COMPREHENSIVE METABOLIC PANEL
ALT: 12 U/L (ref 0–44)
AST: 13 U/L — ABNORMAL LOW (ref 15–41)
Albumin: 3 g/dL — ABNORMAL LOW (ref 3.5–5.0)
Alkaline Phosphatase: 52 U/L (ref 38–126)
Anion gap: 13 (ref 5–15)
BUN: <5 mg/dL — ABNORMAL LOW (ref 8–23)
CO2: 24 mmol/L (ref 22–32)
Calcium: 11.2 mg/dL — ABNORMAL HIGH (ref 8.9–10.3)
Chloride: 101 mmol/L (ref 98–111)
Creatinine, Ser: 0.76 mg/dL (ref 0.44–1.00)
GFR, Estimated: >60 mL/min (ref >60)
Glucose, Bld: 104 mg/dL — ABNORMAL HIGH (ref 70–99)
Potassium: 3.9 mmol/L (ref 3.5–5.1)
Sodium: 138 mmol/L (ref 135–145)
Total Bilirubin: 0.3 mg/dL (ref 0.3–1.2)
Total Protein: 7.5 g/dL (ref 6.5–8.1)

## 2023-02-01 LAB — URINALYSIS, COMPLETE (UACMP) WITH MICROSCOPIC
Bilirubin Urine: NEGATIVE
Glucose, UA: NEGATIVE mg/dL
Hgb urine dipstick: NEGATIVE
Ketones, ur: NEGATIVE mg/dL
Nitrite: NEGATIVE
Protein, ur: NEGATIVE mg/dL
Specific Gravity, Urine: 1.009 (ref 1.005–1.030)
WBC, UA: >50 WBC/hpf (ref 0–5)
pH: 8 (ref 5.0–8.0)

## 2023-02-01 LAB — MAGNESIUM: Magnesium: 1.8 mg/dL (ref 1.7–2.4)

## 2023-02-01 LAB — GLUCOSE, CAPILLARY
Glucose-Capillary: 96 mg/dL (ref 70–99)
Glucose-Capillary: 96 mg/dL (ref 70–99)
Glucose-Capillary: 128 mg/dL — ABNORMAL HIGH (ref 70–99)

## 2023-02-01 LAB — PHOSPHORUS: Phosphorus: 1.9 mg/dL — ABNORMAL LOW (ref 2.5–4.6)

## 2023-02-01 MED ORDER — SODIUM CHLORIDE 0.9 % IV BOLUS
500.0000 mL | Freq: Once | INTRAVENOUS | Status: AC
  Administered 2023-02-01: 500 mL via INTRAVENOUS

## 2023-02-01 MED ORDER — POTASSIUM PHOSPHATES 15 MMOLE/5ML IV SOLN
20.0000 mmol | Freq: Once | INTRAVENOUS | Status: AC
  Administered 2023-02-01: 20 mmol via INTRAVENOUS
  Filled 2023-02-01: qty 6.67

## 2023-02-01 MED ORDER — GLYCOPYRROLATE 0.2 MG/ML IJ SOLN: 0.1000 mg | Freq: Three times a day (TID) | INTRAMUSCULAR | Status: DC | PRN

## 2023-02-01 MED ORDER — SODIUM CHLORIDE 0.9 % IV SOLN: INTRAVENOUS | Status: DC

## 2023-02-01 NOTE — Plan of Care (Signed)
?  Problem: Education: ?Goal: Knowledge of General Education information will improve ?Description: Including pain rating scale, medication(s)/side effects and non-pharmacologic comfort measures ?Outcome: Progressing ?  ?Problem: Clinical Measurements: ?Goal: Ability to maintain clinical measurements within normal limits will improve ?Outcome: Progressing ?Goal: Diagnostic test results will improve ?Outcome: Progressing ?  ?Problem: Nutrition: ?Goal: Adequate nutrition will be maintained ?Outcome: Progressing ?  ?Problem: Coping: ?Goal: Level of anxiety will decrease ?Outcome: Progressing ?  ?

## 2023-02-01 NOTE — Evaluation (Signed)
Physical Therapy Evaluation  Patient Details Name: Denise Savage MRN: [id number] DOB: 02-09-1961 Today's Date: 02/01/2023  History of Present Illness  62 yo female from [facility] non verbal with AMS and R gaze preference. pt with UA was positive.PMH R hemiplegia, aphasia, seizures anxiety depression dependent for adls L craniotomy aneurysm clipping  Clinical Impression  Patient evaluated by Physical Therapy with no further acute PT needs identified. At the time of PT eval pt was not able to participate in any functional task. She required +2 total assistance for all aspects of care. Pt did not open eyes or attempt to turn head towards therapist talking to her. Noted flexion contractures at ankles and knees bilaterally, and recommend Prevalon Boots and air mattress overlay to decrease risk for skin breakdown. See below for any follow-up Physical Therapy or equipment needs. PT is signing off. Thank you for this referral.         If plan is discharge home, recommend the following: Two people to help with walking and/or transfers;Two people to help with bathing/dressing/bathroom;Assistance with cooking/housework;Assistance with feeding;Direct supervision/assist for medications management;Direct supervision/assist for financial management;Assist for transportation;Help with stairs or ramp for entrance;Supervision due to cognitive status   Can travel by private vehicle   No    Equipment Recommendations None recommended by PT  Recommendations for Other Services       Functional Status Assessment Patient has had a recent decline in their functional status and/or demonstrates limited ability to make significant improvements in function in a reasonable and predictable amount of time     Precautions / Restrictions Precautions Precautions: Fall Precaution Comments: Contractures, high risk for skin breakdown Restrictions Weight Bearing Restrictions: No      Mobility  Bed  Mobility Overal bed mobility: Needs Assistance Bed Mobility: Rolling Rolling: +2 for physical assistance, Total assist, +2 for safety/equipment         General bed mobility comments: pt dependent for all mobility. pt holds bil LE in adducted position and peri care is increased difficulty due to adduction of Bil LE. pt rolling R and L. pt noted to push OT with L UE with rolling toward R side showing some activation to L UE. Pt positioned in chair position in the bed without any change in response or visual attention. pt remains with eyes closed the entire session    Transfers                   General transfer comment: Pt will require mechanical lift for OOB    Ambulation/Gait               General Gait Details: Unable to attempt.  Stairs            Wheelchair Mobility     Tilt Bed    Modified Rankin (Stroke Patients Only) Modified Rankin (Stroke Patients Only) Pre-Morbid Rankin Score: Severe disability Modified Rankin: Severe disability     Balance                                             Pertinent Vitals/Pain Pain Assessment Pain Assessment: PAINAD Breathing: normal Negative Vocalization: none Facial Expression: smiling or inexpressive Body Language: relaxed Consolability: no need to console PAINAD Score: 0    Home Living Family/patient expects to be discharged to:: Skilled nursing facility  Prior Function Prior Level of Function : Needs assist                     Extremity/Trunk Assessment   Upper Extremity Assessment Upper Extremity Assessment: RUE deficits/detail;LUE deficits/detail;Defer to OT evaluation RUE Deficits / Details: holds hand in a flexed wrist position with contracture at 3rd 4th 5th digits and any tactile input to access immediately causes fist . pt with noted skin moisture concerns in palm which shows pt keeps hand in fist frequently. palm and between digit  care provided. pt is not a candidate for a resting hand splint at this time due to contracture. recommend wash cloth as palm guard protection when able to get into web space. RUE Sensation: decreased proprioception RUE Coordination: decreased fine motor;decreased gross motor LUE Deficits / Details: resistant to all attempts to assess arm. pt with hand over hand retrieves wash cloth from off her face with decrease grip strength. pt holding hand against gravity as this was noxious stimuli for this patient LUE Coordination: decreased fine motor;decreased gross motor    Lower Extremity Assessment Lower Extremity Assessment: RLE deficits/detail;LLE deficits/detail RLE Deficits / Details: Ankle plantarflexion contracture, knee flexion contracture. Pt resisting PROM in knee/hip. Recommend Prevalon Boot LLE Deficits / Details: Ankle plantarflexion contracture, knee flexion contracture. Pt resisting movement in hip/knee but able to get more range with PROM on the L than the R. Recommend Prevalon boot    Cervical / Trunk Assessment Cervical / Trunk Assessment: Kyphotic  Communication   Communication Communication: Difficulty communicating thoughts/reduced clarity of speech (nonverbal in chart noted)  Cognition Arousal: Stuporous Behavior During Therapy: Flat affect Overall Cognitive Status: Difficult to assess                                          General Comments General comments (skin integrity, edema, etc.): pt with incontinence of loose stool and hygiene provided. tech in room at end of session to place new purewick    Exercises     Assessment/Plan    PT Assessment Patient does not need any further PT services  PT Problem List         PT Treatment Interventions      PT Goals (Current goals can be found in the Care Plan section)  Acute Rehab PT Goals Patient Stated Goal: No goals stated at this time PT Goal Formulation: All assessment and education complete, DC  therapy    Frequency       Co-evaluation PT/OT/SLP Co-Evaluation/Treatment: Yes Reason for Co-Treatment: Necessary to address cognition/behavior during functional activity;Complexity of the patient's impairments (multi-system involvement);For patient/therapist safety PT goals addressed during session: Strengthening/ROM;Mobility/safety with mobility OT goals addressed during session: ADL's and self-care;Proper use of Adaptive equipment and DME;Strengthening/ROM       AM-PAC PT "6 Clicks" Mobility  Outcome Measure Help needed turning from your back to your side while in a flat bed without using bedrails?: Total Help needed moving from lying on your back to sitting on the side of a flat bed without using bedrails?: Total Help needed moving to and from a bed to a chair (including a wheelchair)?: Total Help needed standing up from a chair using your arms (e.g., wheelchair or bedside chair)?: Total Help needed to walk in hospital room?: Total Help needed climbing 3-5 steps with a railing? : Total 6 Click Score: 6  End of Session Equipment Utilized During Treatment: Gait belt Activity Tolerance: Patient tolerated treatment well Patient left: in bed;with call bell/phone within reach;with nursing/sitter in room Nurse Communication: Mobility status;Other (comment) (NT present in room at end of session and updated. Secretary notified PT/OT to ask for air mattress and Prevalon boots. RN messaged after session to update on these items as well.) PT Visit Diagnosis: Muscle weakness (generalized) (M62.81);Difficulty in walking, not elsewhere classified (R26.2);Other symptoms and signs involving the nervous system (R29.898)    Time: 2831-5176 PT Time Calculation (min) (ACUTE ONLY): 24 min   Charges:   PT Evaluation $PT Eval Moderate Complexity: 1 Mod   PT General Charges $$ ACUTE PT VISIT: 1 Visit         Conni Slipper, PT, DPT Acute Rehabilitation Services Secure Chat  Preferred Office: [phone]   Marylynn Pearson 02/01/2023, 1:45 PM

## 2023-02-01 NOTE — Progress Notes (Signed)
Occupational Therapy Evaluation / discharge Patient Details Name: Denise Savage MRN: [id number] DOB: 1960-08-03 Today's Date: 02/01/2023   History of Present Illness 62 yo female from [facility] non verbal with AMS and R gaze preference. pt with UA was positive.PMH R hemiplegia, aphasia, seizures anxiety depression dependent for adls L craniotomy aneurysm clipping   Clinical Impression   Patient evaluated by Occupational Therapy with no further acute OT needs identified. Recommendations for air mattress and bil LE pillow boots for skin integrity communicated to RN / Air traffic controller. Secretary notified of the need for bed at the end of session. Pt high risk for skin break down due to dependence for all care and adduction of bil LE.  See below for any follow-up Occupational Therapy or equipment needs. OT to sign off. Thank you for referral.         If plan is discharge home, recommend the following:      Functional Status Assessment  Patient has had a recent decline in their functional status and demonstrates the ability to make significant improvements in function in a reasonable and predictable amount of time.  Equipment Recommendations  Hospital bed;Hoyer lift;Wheelchair cushion (measurements OT);Wheelchair (measurements OT)    Recommendations for Other Services       Precautions / Restrictions Precautions Precautions: Fall      Mobility Bed Mobility Overal bed mobility: Needs Assistance Bed Mobility: Rolling Rolling: +2 for physical assistance, Total assist, +2 for safety/equipment         General bed mobility comments: pt dependent for all mobility. pt holds bil LE in adducted position and peri care is increased difficulty due to adduction of Bil LE. pt rollign R and L. pt noted to push OT with L UE with rolling toward R side showing some activation to L UE. Pt positioned in chair position in the bed without any chance in response or visual attention. pt remains with  eyes closed the entire session    Transfers                          Balance                                           ADL either performed or assessed with clinical judgement   ADL Overall ADL's : Needs assistance/impaired                                       General ADL Comments: total dependence     Vision   Additional Comments: pt keeping eyes closed when attempting to open lids to see eye movement pt resistant. Ot never visualized eyes or pupils. pt noted to have rapid eye movement under the lids     Perception         Praxis         Pertinent Vitals/Pain Pain Assessment Pain Assessment: PAINAD Breathing: normal Negative Vocalization: none Facial Expression: smiling or inexpressive Body Language: relaxed Consolability: no need to console PAINAD Score: 0     Extremity/Trunk Assessment Upper Extremity Assessment Upper Extremity Assessment: RUE deficits/detail;LUE deficits/detail RUE Deficits / Details: holds hand in a flexed wrist position with contracture at 3rd 4th 5th digits and any tactile input to access immediately causes fist .  pt with noted skin moisture concerns in palm which shows pt keeps hand in fist frequently. palm and between digit care provided. pt is not a candidate for a resting hand splint at this time due to contracture. recommend wash cloth as palm guard protection when able to get into web space. RUE Sensation: decreased proprioception RUE Coordination: decreased fine motor;decreased gross motor LUE Deficits / Details: resistant to all attempts to assess arm. pt with hand over hand retrieves wash cloth from off her face with decrease grip strength. pt holding hand against gravity as this was noxious stimuli for this patient LUE Coordination: decreased fine motor;decreased gross motor   Lower Extremity Assessment Lower Extremity Assessment: Defer to PT evaluation;RLE deficits/detail;LLE  deficits/detail RLE Deficits / Details: contracture at ankle LLE Deficits / Details: contracture at ankle   Cervical / Trunk Assessment Cervical / Trunk Assessment: Kyphotic   Communication Communication Communication: Difficulty communicating thoughts/reduced clarity of speech (nonverbal in chart noted)   Cognition Arousal: Stuporous Behavior During Therapy: Flat affect Overall Cognitive Status: Difficult to assess                                       General Comments  pt with incontinence of loose stool and hygiene provided. tech in room at end of session to place new purewick    Exercises     Shoulder Instructions      Home Living Family/patient expects to be discharged to:: Skilled nursing facility                                        Prior Functioning/Environment                          OT Problem List:        OT Treatment/Interventions:      OT Goals(Current goals can be found in the care plan section) Acute Rehab OT Goals Patient Stated Goal: none stated OT Goal Formulation: Patient unable to participate in goal setting  OT Frequency:      Co-evaluation PT/OT/SLP Co-Evaluation/Treatment: Yes Reason for Co-Treatment: Necessary to address cognition/behavior during functional activity;Complexity of the patient's impairments (multi-system involvement)   OT goals addressed during session: ADL's and self-care;Proper use of Adaptive equipment and DME;Strengthening/ROM      AM-PAC OT "6 Clicks" Daily Activity     Outcome Measure Help from another person eating meals?: Total Help from another person taking care of personal grooming?: Total Help from another person toileting, which includes using toliet, bedpan, or urinal?: Total Help from another person bathing (including washing, rinsing, drying)?: Total Help from another person to put on and taking off regular upper body clothing?: Total Help from another person to  put on and taking off regular lower body clothing?: Total 6 Click Score: 6   End of Session Nurse Communication: Mobility status;Need for lift equipment;Precautions  Activity Tolerance: Patient tolerated treatment well Patient left: in bed;with call bell/phone within reach;Other (comment) (tech in room so alarm not set at this time. requesting air mattress for skin integrity. pt coudl benefit from barrier cream application as well)                   Time: 1324-4010 OT Time Calculation (min): 18 min Charges:  OT General  Charges $OT Visit: 1 Visit OT Evaluation $OT Eval High Complexity: 1 High  Brynn, OTR/L  Acute Rehabilitation Services Office: [phone] .   Mateo Flow 02/01/2023, 1:10 PM

## 2023-02-01 NOTE — Progress Notes (Signed)
Orthopedic Tech Progress Note Patient Details:  Denise Savage 01/21/1961 [id number]  Called floor to let RN know we do not carry PREVALON  BOOTS those are in materials. Secretary said she would order a pair  Patient ID: Denise Savage, female   DOB: Jul 14, 1960, 62 y.o.   MRN: [id number]  [name] 02/01/2023, 1:31 PM

## 2023-02-01 NOTE — Progress Notes (Signed)
PROGRESS NOTE    Denise Savage  WUJ:[id number] DOB: 29-May-1961 DOA: 01/26/2023 PCP: Babs Sciara, MD   Brief Narrative:  62 year old F with PMH of previous hemorrhagic stroke with right-sided hemiplegia, aphasia and dysphagia with tube feed dependence, subarachnoid hemorrhage, seizure disorder, anxiety, depression, GERD and embolic PE/paroxysmal A-fib not on anticoagulation due to blood loss anemia from abnormal uterine bleed brought to [facility] ED due to tremoring of bilateral upper extremities, crying out when approached and touched.  Patient is nonverbal.    On arrival to ED, reportedly postictal and hypotensive. Had lactic acidosis to 3.5.  Hypotension and lactic acidosis resolved quickly with IV fluid.  Foley catheter inserted at [facility] ED.  UA with pyuria. Cultures obtained and started on ceftriaxone.  CT head without acute finding.  Teleneurology consulted and recommended transfer to [facility] for continuous EEG.   Patient arrived at [facility] on 8/16.  Continuous EEG negative for seizure but suggests cortical dysfunction arising from left frontal region likely consistent with underlying craniotomy, and moderate diffuse encephalopathy.  Initially home Keppra was increased to 1000 mg twice daily but discontinued given concern for agitation/behavioral disturbance.  She was started on Vimpat and Depakote.  She had 2 brief episodes of witnessed seizure on 8/19.  She was loaded with Depakote followed by increased dose of Depakote.  LTM EEG unchanged from prior.  Neurology following.   Urine culture with ESBL E. coli but sensitive to Zosyn.  Antibiotic changed to IV Zosyn on 8/18 and completed course with p.o. fosfomycin on 8/20.    Palliative medicine consulted.  Remains full code with full scope of care. Spiked a Temp in the last 24 hours and Calcium trending up so will reinitiate IVF and monitor.   Assessment and Plan:  Seizure disorder with concern for breakthrough seizure -Brought to [facility] ED due to tremoring of  bilateral upper extremities, crying out when approached and touched.   -Reportedly postictal when she arrived in ED.   -She had received benzo and route to ED.   -UDS is positive for benzo.   -Had lactic acidosis to 3.5. CT head without acute finding.   -UA with pyuria.  VPA level subtherapeutic at 17 on admission.  -Continuous EEG without seizure or epileptiform but cortical dysfunction as above.  Continuously crying but distractible (chronic per family).  -Patient had another 2 brief seizure episodes on 8/19.   -Continuous EEG without new finding. -Appreciate input by neurology: -LTM EEG discontinued -Lorazepam 1 to 2 mg as needed for seizure -Continue Vimpat 100mg  BID -Increase Depakote to 750 mg every 8 hours -Maintain seizure precautions -Keppra discontinued due to behavioral disturbance/agitation -Continue Seizure Precautions and close monitoring    Agitation -Continuously crying but distractible.  Per daughter, Denise Savage this is normal for her especially when family members are not around.  Her son usually visits when she was in [place].  He cannot drive to come to [facility]. -C/w Reorientation and delirium precaution -P.o. Seroquel 25 mg twice daily as needed for agitation. Dr. Alanda Slim discussed with Denise Savage about this.   ESBL E. coli UTI -Patient is nonverbal.  Solely based on urine studies.   -Has no fever or leukocytosis.  Urine culture with ESBL E. coli. -Ceftriaxone 8/15>> IV Zosyn 8/18>> p.o. fosfomycin on 8/20 -Spiked a Temperature in the last 24 hours of a T Max of 100.5 -Continue to Monitor and if continues to spike temps will pan-culture and discuss with ID but will get Blood Cx 2 today  AKI: -Baseline Cr ~0.6-0.7.  Likely due to hypotension.  Resolved. -BUN/Cr Trend: Recent Labs  Lab 01/26/23 1626 01/27/23 1452 01/28/23 1710 01/29/23 0456 01/30/23 0852 01/31/23 1032 02/01/23 1012  BUN 13 9 5* 7* <5* 5* <5*  CREATININE 1.10* 0.72 0.79 0.69 1.08* 0.79 0.76  -Avoid  Nephrotoxic Medications, Contrast Dyes, Hypotension and Dehydration to Ensure Adequate Renal Perfusion and will need to Renally Adjust Meds -Continue to Monitor and Trend Renal Function carefully and repeat CMP in the AM     Hypercalcemia -Will bolus 500 mL and re-initiate IVF with NS at 75 mL/hr Recent Labs  Lab 01/26/23 1555 01/27/23 1452 01/28/23 1710 01/29/23 0456 01/30/23 0852 01/31/23 1032 02/01/23 1012  CALCIUM 10.7* 10.3 10.4* 10.0 10.3 10.8* 11.2*  -Continue to Monitor and Trend and Repeat CMP in the AM   History of Aneurysm and SAH/ICH s/p craniotomy. -Bedbound and nonverbal.  Dysphagia with PEG tube dependence -N.p.o. -Appreciate help by dietitian with tube feeds and protocol per their recommendations   Iron deficiency and anemia due to chronic blood loss in the setting of abnormal uterine bleed and thickened endometrium -No obvious bleeding currently.  Some element of hemodilution.   -Anemia panel with iron deficiency iron level was 12, UIBC was 303, TIBC was 315, saturation ratios were 4%, ferritin level 6, folate of 25.5, and vitamin B12 level was 810 Recent Labs  Lab 01/26/23 1626 01/27/23 0524 01/28/23 1716 01/29/23 0456 01/30/23 0852 01/31/23 1032 02/01/23 1012  HGB 12.9 7.7* 7.9* 7.8* 8.5* 9.1* 8.6*  HCT 38.0 26.4* 27.2* 26.2* 29.1* 30.5* 30.0*  MCV  --  71.4* 69.4* 70.4* 70.1* 71.1* 72.1*  -IV ferric gluconate 250 mg daily x 2.  Patient's condition makes it difficult for outpatient iron infusion. -On Depo-Provera injection -Plan was for outpatient follow-up with gynecology when she was discharged in February 2024   Advance care planning: -Patient with comorbidity as above.  Still full code.   -Dr. Alanda Slim really doubt if cardiopulmonary resuscitation is to the patient's best interest if we end up in the situation.  Attempted to call both daughters listed as emergency contact but no answer. -Palliative medicine consulted-remains full code with full scope  of care.   Lactic Acidosis -Multifactorial including possible seizure, hypotension or infection.  -Resolving as LA Level trend: Recent Labs  Lab 01/26/23 1555 01/26/23 1932  LATICACIDVEN 3.5* 2.2*   Hypophosphatemia Hypokalemia -Phos and K+ Trend: Recent Labs  Lab 01/29/23 0456 01/30/23 0852 01/31/23 1032 02/01/23 1012  PHOS 2.0* 1.9* 2.1* 1.9*  K 3.3* 3.7 3.9 3.9  -Replete with IV K Phos 20 mmol -Continue to Monitor and Replete as Necessary -Repeat CMP and Phos Level in the AM   Hypotension -Resolved.   Inadequate oral intake Estimated body mass index is 27.83 kg/m as calculated from the following:   Height as of this encounter: 5\' 7"  (1.702 m).   Weight as of this encounter: 80.6 kg. Nutrition Status: Nutrition Problem: Inadequate oral intake Etiology: dysphagia Signs/Symptoms: NPO status (pt with chronic G-tube)  Hypoalbuminemia -Patient's Albumin Trend: Recent Labs  Lab 01/26/23 1555 01/29/23 0456 01/30/23 0852 01/31/23 1032 02/01/23 1012  ALBUMIN 3.4* 2.8* 2.9* 3.0* 3.0*  -Continue to Monitor and Trend and repeat CMP in the AM    DVT prophylaxis: heparin injection 5,000 Units Start: 01/27/23 1400    Code Status: Full Code Family Communication: No family present at bedside  Disposition Plan:  Level of care: Telemetry Medical Status is: Inpatient Remains inpatient appropriate because: Needs  further clinical improvement and Anticipating D/C to SNF in the next 24-48 hours   Consultants:  Neurology Palliative care medicine  Procedures:  As delineated as above  Antimicrobials:  Anti-infectives (From admission, onward)    Start     Dose/Rate Route Frequency Ordered Stop   01/31/23 1500  fosfomycin (MONUROL) packet 3 g  Status:  Discontinued        3 g Oral  Once 01/29/23 1118 01/31/23 0807   01/31/23 1500  fosfomycin (MONUROL) packet 3 g        3 g Per Tube  Once 01/31/23 0807 01/31/23 1448   01/29/23 1145  piperacillin-tazobactam (ZOSYN) IVPB  3.375 g  Status:  Discontinued        3.375 g 12.5 mL/hr over 240 Minutes Intravenous Every 8 hours 01/29/23 1046 01/29/23 1046   01/29/23 1145  piperacillin-tazobactam (ZOSYN) IVPB 3.375 g  Status:  Discontinued        3.375 g 12.5 mL/hr over 240 Minutes Intravenous Every 8 hours 01/29/23 1057 01/29/23 1110   01/29/23 1145  piperacillin-tazobactam (ZOSYN) IVPB 3.375 g        3.375 g 12.5 mL/hr over 240 Minutes Intravenous Every 8 hours 01/29/23 1110 01/31/23 0941   01/26/23 2215  cefTRIAXone (ROCEPHIN) 1 g in sodium chloride 0.9 % 100 mL IVPB  Status:  Discontinued        1 g 200 mL/hr over 30 Minutes Intravenous Daily at bedtime 01/26/23 2202 01/29/23 1041       Subjective: Seen and examined at bedside and she is nonverbal and appears calmer and was not crying on my examination.  No acute events noted by nursing but she has been refusing her glycopyrrolate p.o.  No other concerns or complaints at this time.  Objective: Vitals:   02/01/23 0529 02/01/23 0751 02/01/23 1206 02/01/23 1704  BP:  133/89 (!) 145/85 (!) 158/97  Pulse:  85 83 87  Resp:  17 17 18   Temp:  99.5 F (37.5 C) 99.1 F (37.3 C) 99.5 F (37.5 C)  TempSrc:  Oral Oral Oral  SpO2:  100% 100% 100%  Weight: 80.6 kg     Height:        Intake/Output Summary (Last 24 hours) at 02/01/2023 1934 Last data filed at 02/01/2023 1405 Gross per 24 hour  Intake 746.14 ml  Output 1000 ml  Net -253.86 ml   Filed Weights   01/30/23 0539 01/31/23 1840 02/01/23 0529  Weight: 79.8 kg 79.9 kg 80.6 kg   Examination: Physical Exam:  Constitutional: WN/WD overweight chronically ill-appearing African-American female who is nonverbal did not really want to interact today Respiratory: Diminished to auscultation bilaterally, no wheezing, rales, rhonchi or crackles. Normal respiratory effort and patient is not tachypenic. No accessory muscle use.  Unlabored breathing Cardiovascular: RRR, no murmurs / rubs / gallops. S1 and S2  auscultated. No extremity edema.  Abdomen: Soft, non-tender, distended secondary to body habitus.  Has a G-tube in place.  Bowel sounds positive.  GU: Deferred.  Foley catheter is in place Musculoskeletal: Has some mild contractures and deformities in extremities Skin: No rashes, lesions, ulcers limited skin evaluation. No induration; Warm and dry.  Neurologic: Little somnolent but arousable but does not follow commands and does not vocalize or speak. Psychiatric: Appears calm and is not crying.  Data Reviewed: I have personally reviewed following labs and imaging studies  CBC: Recent Labs  Lab 01/26/23 1555 01/26/23 1626 01/28/23 1716 01/29/23 0456 01/30/23 0852 01/31/23 1032 02/01/23 1012  WBC 6.5   < > 5.5 5.7 8.9 7.9 6.3  NEUTROABS 4.9  --   --   --   --   --  3.8  HGB 9.0*   < > 7.9* 7.8* 8.5* 9.1* 8.6*  HCT 31.6*   < > 27.2* 26.2* 29.1* 30.5* 30.0*  MCV 73.3*   < > 69.4* 70.4* 70.1* 71.1* 72.1*  PLT 201   < > 191 203 212 252 221   < > = values in this interval not displayed.   Basic Metabolic Panel: Recent Labs  Lab 01/28/23 1710 01/28/23 1716 01/29/23 0456 01/30/23 0852 01/31/23 1032 02/01/23 1012  NA 137  --  134* 133* 135 138  K 3.7  --  3.3* 3.7 3.9 3.9  CL 109  --  104 103 103 101  CO2 18*  --  20* 19* 21* 24  GLUCOSE 103*  --  117* 113* 112* 104*  BUN 5*  --  7* <5* 5* <5*  CREATININE 0.79  --  0.69 1.08* 0.79 0.76  CALCIUM 10.4*  --  10.0 10.3 10.8* 11.2*  MG  --  1.4* 2.1 1.8 1.9 1.8  PHOS  --   --  2.0* 1.9* 2.1* 1.9*   GFR: Estimated Creatinine Clearance: 79.7 mL/min (by C-G formula based on SCr of 0.76 mg/dL). Liver Function Tests: Recent Labs  Lab 01/26/23 1555 01/29/23 0456 01/30/23 0852 01/31/23 1032 02/01/23 1012  AST 26  --   --   --  13*  ALT 21  --   --   --  12  ALKPHOS 76  --   --   --  52  BILITOT 0.4  --   --   --  0.3  PROT 7.9  --   --   --  7.5  ALBUMIN 3.4* 2.8* 2.9* 3.0* 3.0*   No results for input(s): "LIPASE",  "AMYLASE" in the last 168 hours. No results for input(s): "AMMONIA" in the last 168 hours. Coagulation Profile: Recent Labs  Lab 01/26/23 1555  INR 1.1   Cardiac Enzymes: No results for input(s): "CKTOTAL", "CKMB", "CKMBINDEX", "TROPONINI" in the last 168 hours. BNP (last 3 results) No results for input(s): "PROBNP" in the last 8760 hours. HbA1C: No results for input(s): "HGBA1C" in the last 72 hours. CBG: Recent Labs  Lab 01/31/23 1143 01/31/23 1825 02/01/23 0559 02/01/23 1205 02/01/23 1704  GLUCAP 113* 108* 96 96 128*   Lipid Profile: No results for input(s): "CHOL", "HDL", "LDLCALC", "TRIG", "CHOLHDL", "LDLDIRECT" in the last 72 hours. Thyroid Function Tests: No results for input(s): "TSH", "T4TOTAL", "FREET4", "T3FREE", "THYROIDAB" in the last 72 hours. Anemia Panel: No results for input(s): "VITAMINB12", "FOLATE", "FERRITIN", "TIBC", "IRON", "RETICCTPCT" in the last 72 hours. Sepsis Labs: Recent Labs  Lab 01/26/23 1555 01/26/23 1932  LATICACIDVEN 3.5* 2.2*    Recent Results (from the past 240 hour(s))  Blood Culture (routine x 2)     Status: None   Collection Time: 01/26/23  5:03 PM   Specimen: BLOOD  Result Value Ref Range Status   Specimen Description BLOOD LEFT ANTECUBITAL  Final   Special Requests   Final    BOTTLES DRAWN AEROBIC ONLY Blood Culture adequate volume   Culture   Final    NO GROWTH 5 DAYS Performed at [facility], [address], [place], [place] [address]    Report Status 01/31/2023 FINAL  Final  Blood Culture (routine x 2)     Status: None   Collection Time:  01/26/23  5:11 PM   Specimen: BLOOD  Result Value Ref Range Status   Specimen Description BLOOD RIGHT ANTECUBITAL  Final   Special Requests   Final    BOTTLES DRAWN AEROBIC ONLY Blood Culture results may not be optimal due to an inadequate volume of blood received in culture bottles   Culture   Final    NO GROWTH 5 DAYS Performed at [facility], [address], [place],  [place] [address]    Report Status 01/31/2023 FINAL  Final  Resp panel by RT-PCR (RSV, Flu A&B, Covid) Anterior Nasal Swab     Status: None   Collection Time: 01/26/23  5:12 PM   Specimen: Anterior Nasal Swab  Result Value Ref Range Status   SARS Coronavirus 2 by RT PCR NEGATIVE NEGATIVE Final    Comment: (NOTE) SARS-CoV-2 target nucleic acids are NOT DETECTED.  The SARS-CoV-2 RNA is generally detectable in upper respiratory specimens during the acute phase of infection. The lowest concentration of SARS-CoV-2 viral copies this assay can detect is 138 copies/mL. A negative result does not preclude SARS-Cov-2 infection and should not be used as the sole basis for treatment or other patient management decisions. A negative result may occur with  improper specimen collection/handling, submission of specimen other than nasopharyngeal swab, presence of viral mutation(s) within the areas targeted by this assay, and inadequate number of viral copies(<138 copies/mL). A negative result must be combined with clinical observations, patient history, and epidemiological information. The expected result is Negative.  Fact Sheet for Patients:  BloggerCourse.com  Fact Sheet for Healthcare Providers:  SeriousBroker.it  This test is no t yet approved or cleared by the [place] FDA and  has been authorized for detection and/or diagnosis of SARS-CoV-2 by FDA under an Emergency Use Authorization (EUA). This EUA will remain  in effect (meaning this test can be used) for the duration of the COVID-19 declaration under Section 564(b)(1) of the Act, 21 U.S.C.section 360bbb-3(b)(1), unless the authorization is terminated  or revoked sooner.       Influenza A by PCR NEGATIVE NEGATIVE Final   Influenza B by PCR NEGATIVE NEGATIVE Final    Comment: (NOTE) The Xpert Xpress SARS-CoV-2/FLU/RSV plus assay is intended as an aid in the diagnosis of influenza from  Nasopharyngeal swab specimens and should not be used as a sole basis for treatment. Nasal washings and aspirates are unacceptable for Xpert Xpress SARS-CoV-2/FLU/RSV testing.  Fact Sheet for Patients: BloggerCourse.com  Fact Sheet for Healthcare Providers: SeriousBroker.it  This test is not yet approved or cleared by the [place] FDA and has been authorized for detection and/or diagnosis of SARS-CoV-2 by FDA under an Emergency Use Authorization (EUA). This EUA will remain in effect (meaning this test can be used) for the duration of the COVID-19 declaration under Section 564(b)(1) of the Act, 21 U.S.C. section 360bbb-3(b)(1), unless the authorization is terminated or revoked.     Resp Syncytial Virus by PCR NEGATIVE NEGATIVE Final    Comment: (NOTE) Fact Sheet for Patients: BloggerCourse.com  Fact Sheet for Healthcare Providers: SeriousBroker.it  This test is not yet approved or cleared by the [place] FDA and has been authorized for detection and/or diagnosis of SARS-CoV-2 by FDA under an Emergency Use Authorization (EUA). This EUA will remain in effect (meaning this test can be used) for the duration of the COVID-19 declaration under Section 564(b)(1) of the Act, 21 U.S.C. section 360bbb-3(b)(1), unless the authorization is terminated or revoked.  Performed at [facility],  [address] [place], [place] [address]   Urine Culture     Status: Abnormal   Collection Time: 01/26/23  6:04 PM   Specimen: Urine, Random  Result Value Ref Range Status   Specimen Description   Final    URINE, RANDOM Performed at [facility], [address], [place], [place] [address]    Special Requests   Final    NONE Performed at [facility], [address], [place], [place] [address]    Culture (A)  Final    >=100,000 COLONIES/mL ESCHERICHIA COLI Confirmed Extended Spectrum  Beta-Lactamase Producer (ESBL).  In bloodstream infections from ESBL organisms, carbapenems are preferred over piperacillin/tazobactam. They are shown to have a lower risk of mortality.    Report Status 01/29/2023 FINAL  Final   Organism ID, Bacteria ESCHERICHIA COLI (A)  Final      Susceptibility   Escherichia coli - MIC*    AMPICILLIN >=32 RESISTANT Resistant     CEFAZOLIN >=64 RESISTANT Resistant     CEFEPIME 16 RESISTANT Resistant     CEFTRIAXONE >=64 RESISTANT Resistant     CIPROFLOXACIN >=4 RESISTANT Resistant     GENTAMICIN >=16 RESISTANT Resistant     IMIPENEM <=0.25 SENSITIVE Sensitive     NITROFURANTOIN <=16 SENSITIVE Sensitive     TRIMETH/SULFA <=20 SENSITIVE Sensitive     AMPICILLIN/SULBACTAM 8 SENSITIVE Sensitive     PIP/TAZO <=4 SENSITIVE Sensitive     * >=100,000 COLONIES/mL ESCHERICHIA COLI    Radiology Studies: DG CHEST PORT 1 VIEW  Result Date: 02/01/2023 CLINICAL DATA:  Fever EXAM: PORTABLE CHEST 1 VIEW COMPARISON:  01/26/2023 FINDINGS: Stable cardiomediastinal contours. Aortic atherosclerosis. Subtly increased interstitial markings in the right lower lobe. No pleural effusion or pneumothorax. IMPRESSION: Subtly increased interstitial markings in the right lower lobe, which may reflect atelectasis or developing infiltrate. Electronically Signed   By: Duanne Guess D.O.   On: 02/01/2023 13:27   Overnight EEG with video  Result Date: 01/31/2023 Denise Quest, MD     01/31/2023 12:43 PM Patient Name: JAYLEA TAPANI MRN: [id number] Epilepsy Attending: Charlsie Savage Referring Physician/Provider: Jefferson Fuel, MD Duration: 01/30/2023 1617 to 01/31/2023 1147  Patient history: 62 year old patient with history of seizures, previous hemorrhagic stroke with right-sided hemiplegia and aphasia, subarachnoid hemorrhage who presents with an episode of unresponsiveness and seizure-like activity. On exam, she is noted to have tremoring of bilateral upper extremities and is  nonverbal, crying out when approached or touched.  She cannot follow commands but will occasionally mimic. EEG to evaluate for seizure.  Level of alertness: awake/lethargic  AEDs during EEG study: LCM, VPA  Technical aspects: This EEG study was done with scalp electrodes positioned according to the 10-20 International system of electrode placement. Electrical activity was reviewed with band pass filter of 1-70Hz , sensitivity of 7 uV/mm, display speed of 46mm/sec with a 60Hz  notched filter applied as appropriate. EEG data were recorded continuously and digitally stored.  Video monitoring was available and reviewed as appropriate.  Description: The posterior dominant rhythm consists of 7 Hz activity of moderate voltage (25-35 uV) seen predominantly in posterior head regions, symmetric and reactive to eye opening and eye closing. EEG showed continuous generalized 3 to 6 Hz theta-delta slowing. There was also sharply contoured 5-7z theta slowing in left frontal region consistent with craniotomy. Hyperventilation and photic stimulation were not performed.    ABNORMALITY - Breach artifact, left frontal region - Continuous slow, generalized and lateralized left hemisphere  IMPRESSION: This study is  suggestive of cortical dysfunction arising from left hemisphere maximal left frontal region likely consistent with underlying encephalomalacia and craniotomy. Additionally there is moderate diffuse encephalopathy. No seizures or definite epileptiform discharges were seen throughout the recording.  Denise Savage   Scheduled Meds:  feeding supplement (JEVITY 1.5 CAL/FIBER)  237 mL Per Tube 5 X Daily   fiber supplement (BANATROL TF)  60 mL Per Tube BID   free water  120 mL Per Tube 5 X Daily   heparin  5,000 Units Subcutaneous Q8H   mouth rinse  15 mL Mouth Rinse 4 times per day   pantoprazole (PROTONIX) IV  40 mg Intravenous Q24H   Continuous Infusions:  sodium chloride     lacosamide (VIMPAT) IV 100 mg (02/01/23  1003)   sodium chloride     valproate sodium 750 mg (02/01/23 1502)    LOS: 6 days   Denise Merles, DO [facility] Available via Epic secure chat 7am-7pm After these hours, please refer to coverage provider listed on [email or web address] 02/01/2023, 7:34 PM

## 2023-02-01 NOTE — Plan of Care (Signed)
Pt responds to name. Agitated with care or at times noted to repeat a sound/scream. Seroquel given for anixety/agitation. Effective. Pt had foley removed 8/20. Bladder scan 102. Bladder scan every 6. Pt total care with bolus feeds via peg tube. Purwick in place. Temp 100.5 tylenol given for discomfort with turning. It was effective at decreasing temp. Flagged yellow mews due to temp and heart rate. After tylenol given mews green. Charge RN aware. Attempted robinul pt refused would not open mouth. Problem: Coping: Goal: Level of anxiety will decrease Outcome: Not Progressing   Problem: Clinical Measurements: Goal: Ability to maintain clinical measurements within normal limits will improve Outcome: Progressing Goal: Will remain free from infection Outcome: Progressing Goal: Diagnostic test results will improve Outcome: Progressing Goal: Respiratory complications will improve Outcome: Progressing Goal: Cardiovascular complication will be avoided Outcome: Progressing   Problem: Activity: Goal: Risk for activity intolerance will decrease Outcome: Progressing   Problem: Nutrition: Goal: Adequate nutrition will be maintained Outcome: Progressing   Problem: Elimination: Goal: Will not experience complications related to bowel motility Outcome: Progressing Goal: Will not experience complications related to urinary retention Outcome: Progressing   Problem: Pain Managment: Goal: General experience of comfort will improve Outcome: Progressing   Problem: Safety: Goal: Ability to remain free from injury will improve Outcome: Progressing   Problem: Skin Integrity: Goal: Risk for impaired skin integrity will decrease Outcome: Progressing

## 2023-02-02 DIAGNOSIS — R9389 Abnormal findings on diagnostic imaging of other specified body structures: Secondary | ICD-10-CM | POA: Diagnosis not present

## 2023-02-02 DIAGNOSIS — N390 Urinary tract infection, site not specified: Secondary | ICD-10-CM | POA: Diagnosis not present

## 2023-02-02 DIAGNOSIS — I608 Other nontraumatic subarachnoid hemorrhage: Secondary | ICD-10-CM | POA: Diagnosis not present

## 2023-02-02 DIAGNOSIS — R569 Unspecified convulsions: Secondary | ICD-10-CM | POA: Diagnosis not present

## 2023-02-02 LAB — COMPREHENSIVE METABOLIC PANEL
ALT: 7 U/L (ref 0–44)
AST: 21 U/L (ref 15–41)
Albumin: 2.7 g/dL — ABNORMAL LOW (ref 3.5–5.0)
Alkaline Phosphatase: 49 U/L (ref 38–126)
Anion gap: 11 (ref 5–15)
BUN: <5 mg/dL — ABNORMAL LOW (ref 8–23)
CO2: 20 mmol/L — ABNORMAL LOW (ref 22–32)
Calcium: 10.6 mg/dL — ABNORMAL HIGH (ref 8.9–10.3)
Chloride: 105 mmol/L (ref 98–111)
Creatinine, Ser: 0.47 mg/dL (ref 0.44–1.00)
GFR, Estimated: >60 mL/min (ref >60)
Glucose, Bld: 84 mg/dL (ref 70–99)
Potassium: 4.7 mmol/L (ref 3.5–5.1)
Sodium: 136 mmol/L (ref 135–145)
Total Bilirubin: 0.6 mg/dL (ref 0.3–1.2)
Total Protein: 6.6 g/dL (ref 6.5–8.1)

## 2023-02-02 LAB — CBC WITH DIFFERENTIAL/PLATELET
Abs Immature Granulocytes: 0.03 10*3/uL (ref 0.00–0.07)
Basophils Absolute: 0 10*3/uL (ref 0.0–0.1)
Basophils Relative: 0 %
Eosinophils Absolute: 0.1 10*3/uL (ref 0.0–0.5)
Eosinophils Relative: 2 %
HCT: 31.6 % — ABNORMAL LOW (ref 36.0–46.0)
Hemoglobin: 9.4 g/dL — ABNORMAL LOW (ref 12.0–15.0)
Immature Granulocytes: 1 %
Lymphocytes Relative: 23 %
Lymphs Abs: 1.5 10*3/uL (ref 0.7–4.0)
MCH: 21.3 pg — ABNORMAL LOW (ref 26.0–34.0)
MCHC: 29.7 g/dL — ABNORMAL LOW (ref 30.0–36.0)
MCV: 71.7 fL — ABNORMAL LOW (ref 80.0–100.0)
Monocytes Absolute: 0.7 10*3/uL (ref 0.1–1.0)
Monocytes Relative: 10 %
Neutro Abs: 4.2 10*3/uL (ref 1.7–7.7)
Neutrophils Relative %: 64 %
Platelets: 234 10*3/uL (ref 150–400)
RBC: 4.41 MIL/uL (ref 3.87–5.11)
RDW: 22.3 % — ABNORMAL HIGH (ref 11.5–15.5)
Smear Review: ADEQUATE
WBC: 6.4 10*3/uL (ref 4.0–10.5)
nRBC: 0 % (ref 0.0–0.2)

## 2023-02-02 LAB — GLUCOSE, CAPILLARY
Glucose-Capillary: 103 mg/dL — ABNORMAL HIGH (ref 70–99)
Glucose-Capillary: 75 mg/dL (ref 70–99)
Glucose-Capillary: 86 mg/dL (ref 70–99)
Glucose-Capillary: 94 mg/dL (ref 70–99)

## 2023-02-02 LAB — MAGNESIUM: Magnesium: 1.7 mg/dL (ref 1.7–2.4)

## 2023-02-02 LAB — PHOSPHORUS: Phosphorus: 3.8 mg/dL (ref 2.5–4.6)

## 2023-02-02 MED ORDER — MAGNESIUM SULFATE 2 GM/50ML IV SOLN
2.0000 g | Freq: Once | INTRAVENOUS | Status: AC
  Administered 2023-02-02: 2 g via INTRAVENOUS
  Filled 2023-02-02: qty 50

## 2023-02-02 MED ORDER — SODIUM CHLORIDE 0.9 % IV BOLUS
500.0000 mL | Freq: Once | INTRAVENOUS | Status: AC
  Administered 2023-02-02: 500 mL via INTRAVENOUS

## 2023-02-02 MED ORDER — FREE WATER
240.0000 mL | Freq: Two times a day (BID) | Status: DC
  Administered 2023-02-02 – 2023-02-03 (×3): 240 mL

## 2023-02-02 MED ORDER — BANATROL TF EN LIQD
60.0000 mL | Freq: Three times a day (TID) | ENTERAL | Status: DC
  Administered 2023-02-02 – 2023-02-03 (×5): 60 mL
  Filled 2023-02-02 (×5): qty 60

## 2023-02-02 MED ORDER — LACOSAMIDE 50 MG PO TABS
100.0000 mg | ORAL_TABLET | Freq: Two times a day (BID) | ORAL | Status: DC
  Administered 2023-02-02 – 2023-02-03 (×3): 100 mg
  Filled 2023-02-02 (×3): qty 2

## 2023-02-02 MED ORDER — LACOSAMIDE 50 MG PO TABS: 100.0000 mg | ORAL_TABLET | Freq: Two times a day (BID) | ORAL | Status: DC

## 2023-02-02 MED ORDER — VALPROIC ACID 250 MG/5ML PO SOLN
750.0000 mg | Freq: Three times a day (TID) | ORAL | Status: DC
  Administered 2023-02-02 – 2023-02-03 (×5): 750 mg
  Filled 2023-02-02 (×5): qty 15

## 2023-02-02 MED ORDER — VALPROIC ACID 250 MG PO CAPS: 750.0000 mg | ORAL_CAPSULE | Freq: Three times a day (TID) | ORAL | Status: DC

## 2023-02-02 NOTE — Progress Notes (Signed)
While this RN and NT were giving peri care to patient after BM was noted.  This RN noted that patient had brown, jelly-like discharge coming from her vaginal area.  MD notified after peri care was completed.

## 2023-02-02 NOTE — Progress Notes (Signed)
Nutrition Follow-up  DOCUMENTATION CODES:   Not applicable  INTERVENTION:  Continue current tube feed regimen via G-tube: -Jevity 1.5 1 carton (237 ml) 5 times daily via gravity syringe -Provides: 1775 kcal, 76 grams of protein, 900 mL H2O daily  Continue to flush tube with 60 mL before and after each feed 5 times daily. Will also resume additional 240 mL water flush BID between feeds per home regimen. Pt receives a total of 1980 mL H2O daily (900 mL from tube feeds + 1080 mL from flushes).  Will increase Banatrol TF 60 mL to TID per tube in setting of type 7 stools.  Suspect may be related to antibiotics pt received earlier in admission and that stools will normalize with time.  NUTRITION DIAGNOSIS:   Inadequate oral intake related to dysphagia as evidenced by NPO status (pt with chronic G-tube).  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  -Pt received continuous EEG that was negative for seizure. Suggested cortical dysfunction arising from left frontal region likely consistent with underlying craniotomy and moderate diffuse encephalopathy. Two brief episodes of witnessed seizure 8/19. Neurology following and made changes to medications.  Assessed pt at bedside. Pt is nonverbal. No family members present at time of RD assessment. Per discussion with RN pt is tolerating tube feeds well aside from ongoing diarrhea/loose stools. Will  increase Banatrol to TID. Suspect diarrhea may be related to antibiotics pt received earlier in admission (just stopped on 8/20). Pt is admitted from [facility]. RD who completed initial assessment on 8/17 spoke with RN at [facility] to obtain home tube feed regimen. Pt home regimen, pt also receives an additional 240 mL water flush twice daily between feeds.  Pt weighed 79.9 kg on 8/16. Overall weight stable this admission. Most recent wt 80.6 kg on 02/01/23.  Enteral Access: 24 Fr. G-tube  Tube Feeds: Jevity 1.5 1 carton (237 mL) 5 times daily via gravity via G-tube Free water: 60 mL water flush before and after each feeding  Medications reviewed and include: Banatrol TF 60 mL BID, pantoprazole, NS at 75 mL/hour, lacosamide, valproate Pt received IV ferric gluconate 250 mg daily x 2 in setting of iron panel checked this admission with iron deficiency  Labs reviewed: CBG 75-128, CO2 20, BUN <5, Calcium 10.6 (corrects to 11.64 with albumin 2.7)  UOP: 1900 mL (1 mL/kg/hr) UOP + 1 occurrence unmeasured UOP in previous 24 hours  I/O: -2860 mL since admission  Discussed with MD via secure chat. Plan is to resume the additional 240 mL free water BID per patient's home regimen.  NUTRITION - FOCUSED PHYSICAL EXAM:  Flowsheet Row Most Recent Value  Orbital Region No depletion  Upper Arm Region No depletion  Thoracic and Lumbar Region No depletion  Buccal Region No depletion  Temple Region No depletion  [assessed right temple region, s/p left hemicraniectomy]  Clavicle Bone Region No depletion  Clavicle and Acromion Bone Region No depletion  Scapular Bone Region Unable to assess  Dorsal Hand Mild depletion  Patellar Region Moderate depletion  [suspect related to limited use]  Anterior Thigh Region Mild depletion  [suspect related to limited use]  Posterior Calf Region Moderate depletion  [suspect related to limited use]  Edema (RD Assessment) Mild  Hair Reviewed  Eyes Unable to  assess  Mouth Unable to assess  Skin Reviewed  Nails Reviewed      Diet Order:   Diet Order             Diet NPO time specified Except for: Sips with Meds  Diet effective now                  EDUCATION NEEDS:   No education needs have been identified at this time  Skin:  Skin Assessment: Reviewed RN Assessment  Last BM:  02/02/23 - large type 7  Height:   Ht Readings from Last 1 Encounters:  01/26/23 5\' 7"  (1.702 m)   Weight:   Wt Readings from Last 1 Encounters:  02/01/23 80.6 kg   Ideal Body Weight:  61.3 kg  BMI:  Body mass index is 27.83 kg/m.  Estimated Nutritional Needs:   Kcal:  1600-1800kcal/day  Protein:  80-90g/day  Fluid:  1.8-2.1L/day  Letta Median, MS, RD, LDN, CNSC Pager number available on Amion

## 2023-02-02 NOTE — Progress Notes (Signed)
PROGRESS NOTE    Denise Savage  QIO:[id number] DOB: 07-May-1961 DOA: 01/26/2023 PCP: Babs Sciara, MD   Brief Narrative:  62 year old F with PMH of previous hemorrhagic stroke with right-sided hemiplegia, aphasia and dysphagia with tube feed dependence, subarachnoid hemorrhage, seizure disorder, anxiety, depression, GERD and embolic PE/paroxysmal A-fib not on anticoagulation due to blood loss anemia from abnormal uterine bleed brought to [facility] ED due to tremoring of bilateral upper extremities, crying out when approached and touched.  Patient is nonverbal.    On arrival to ED, reportedly postictal and hypotensive. Had lactic acidosis to 3.5.  Hypotension and lactic acidosis resolved quickly with IV fluid.  Foley catheter inserted at [facility] ED.  UA with pyuria. Cultures obtained and started on ceftriaxone.  CT head without acute finding.  Teleneurology consulted and recommended transfer to [facility] for continuous EEG.   Patient arrived at [facility] on 8/16.  Continuous EEG negative for seizure but suggests cortical dysfunction arising from left frontal region likely consistent with underlying craniotomy, and moderate diffuse encephalopathy.  Initially home Keppra was increased to 1000 mg twice daily but discontinued given concern for agitation/behavioral disturbance.  She was started on Vimpat and Depakote.  She had 2 brief episodes of witnessed seizure on 8/19.  She was loaded with Depakote followed by increased dose of Depakote.  LTM EEG unchanged from prior.  Neurology following and appreciate their evaluation and they have changed her to oral antiepileptics.   Urine culture with ESBL E. coli but sensitive to Zosyn.  Antibiotic changed to IV Zosyn on 8/18 and completed course with p.o. fosfomycin on 8/20.    Palliative medicine consulted.  Remains full code with full scope of care. Spiked a Temp in the last 24 hours and Calcium trending up so will reinitiate IVF and monitor and repeat blood cultures.  Was planning  on discharging today but nurse feels she is having some stool coming out of the vagina today however this could be the bloody discharge if she has more thickened and abnormal uterine bleeding.  Will obtain a CT scan of the abdomen pelvis just to rule out fistula and anticipating discharging back to SNF in the next 24 to 48 hours  Assessment and Plan:  Seizure disorder with concern for breakthrough seizure -Brought to [facility] ED due to tremoring of bilateral upper extremities, crying out when approached and touched.   -Reportedly postictal when she arrived in ED.   -She had received benzo and route to ED.   -UDS is positive for benzo.   -Had lactic acidosis to 3.5. CT head without acute finding.   -UA with pyuria.  VPA level subtherapeutic at 17 on admission.  -Continuous EEG without seizure or epileptiform but cortical dysfunction as above.  Continuously crying but distractible (chronic per family).  -Patient had another 2 brief seizure episodes on 8/19.   -Continuous EEG without new finding. -Appreciate input by neurology: -LTM EEG discontinued -Lorazepam 1 to 2 mg as needed for seizure -Continue Vimpat 100mg  BID and this has been changed to p.o. -Increase Depakote to 750 mg every 8 hours and this is also been changed to p.o. for the PEG tube -Maintain seizure precautions -Keppra discontinued due to behavioral disturbance/agitation -Continue Seizure Precautions and close monitoring and likely can be discharged in the a.m. and appreciate neurology evaluation   Agitation -Continuously crying but distractible.  Per daughter, [name] this is normal for her especially when family members are not around.  Her son usually visits when she was  in [place].  He cannot drive to come to [facility]. -C/w Reorientation and delirium precaution -P.o. Seroquel 25 mg twice daily as needed for agitation. Dr. Alanda Slim discussed with [name] about this.   ESBL E. coli UTI with recurrent fever -Patient is nonverbal.  Solely  based on urine studies.   -Has no fever or leukocytosis.  Urine culture with ESBL E. coli. -Ceftriaxone 8/15>> IV Zosyn 8/18>> p.o. fosfomycin on 8/20 -Her temperature is improved but a few days ago she did have a Tmax of 100.5 -Given her recurrent fever we have repeated cultures -Repeat blood cultures x 2 showing no growth to date at less than 24 hours -Repeat urinalysis done and showed a hazy appearance with large leukocytes, negative nitrites, rare bacteria, greater than 50 WBCs -Repeat chest x-ray done showed "Subtly increased interstitial markings in the right lower lobe, which may reflect atelectasis or developing infiltrate."   AKI: -Baseline Cr ~0.6-0.7.  Likely due to hypotension.  Resolved. -BUN/Cr Trend: Recent Labs  Lab 01/27/23 1452 01/28/23 1710 01/29/23 0456 01/30/23 0852 01/31/23 1032 02/01/23 1012 02/02/23 0305  BUN 9 5* 7* <5* 5* <5* <5*  CREATININE 0.72 0.79 0.69 1.08* 0.79 0.76 0.47  -Avoid Nephrotoxic Medications, Contrast Dyes, Hypotension and Dehydration to Ensure Adequate Renal Perfusion and will need to Renally Adjust Meds -Continue to Monitor and Trend Renal Function carefully and repeat CMP in the AM     Hypercalcemia -Given bolus 500 mL and re-initiated IVF with NS at 75 mL/hr but will stop today and resume Free Water Flushes  Recent Labs  Lab 01/27/23 1452 01/28/23 1710 01/29/23 0456 01/30/23 0852 01/31/23 1032 02/01/23 1012 02/02/23 0305  CALCIUM 10.3 10.4* 10.0 10.3 10.8* 11.2* 10.6*  -Continue to Monitor and Trend and Repeat CMP in the AM as calcium is improving  History of Aneurysm and SAH/ICH s/p craniotomy. -Bedbound and nonverbal.  Dysphagia with PEG tube dependence -N.p.o. -Appreciate help by dietitian with tube feeds and protocol per their recommendations   Iron deficiency and anemia due to chronic blood loss in the setting of abnormal uterine bleed and thickened endometrium -No obvious bleeding currently.  Some element of  hemodilution.   -Anemia panel with iron deficiency iron level was 12, UIBC was 303, TIBC was 315, saturation ratios were 4%, ferritin level 6, folate of 25.5, and vitamin B12 level was 810 Recent Labs  Lab 01/26/23 1626 01/27/23 0524 01/28/23 1716 01/29/23 0456 01/30/23 0852 01/31/23 1032 02/01/23 1012  HGB 12.9 7.7* 7.9* 7.8* 8.5* 9.1* 8.6*  HCT 38.0 26.4* 27.2* 26.2* 29.1* 30.5* 30.0*  MCV  --  71.4* 69.4* 70.4* 70.1* 71.1* 72.1*  -IV ferric gluconate 250 mg daily x 2.  Patient's condition makes it difficult for outpatient iron infusion. -On Depo-Provera injection outpatient -Repeating a CT scan of the abdomen pelvis as below given that the nurse is concerned that she may be having some stool coming out of her vagina however it could be her chronic abnormal uterine bleeding from the thickened endometrium -Plan was for outpatient follow-up with gynecology when she was discharged in February 2024   Advance care planning: -Patient with comorbidity as above.  Still full code.   -Dr. Alanda Slim really doubt if cardiopulmonary resuscitation is to the patient's best interest if we end up in the situation.  Attempted to call both daughters listed as emergency contact but no answer. -Palliative medicine consulted-remains full code with full scope of care as of now.   Lactic Acidosis -Multifactorial including possible seizure, hypotension or  infection.  -Resolving as LA Level trend: Recent Labs  Lab 01/26/23 1555 01/26/23 1932  LATICACIDVEN 3.5* 2.2*  -Repeat lactic acid level in the a.m.  Hypophosphatemia Hypokalemia -Phos and K+ Trend: Recent Labs  Lab 01/29/23 0456 01/30/23 0852 01/31/23 1032 02/01/23 1012 02/02/23 0305  PHOS 2.0* 1.9* 2.1* 1.9* 3.8  K 3.3* 3.7 3.9 3.9 4.7  -Replete with IV K Phos 20 mmol yesterday and electrolytes have improved -Continue to Monitor and Replete as Necessary -Repeat CMP and Phos Level in the AM  Stool from Vagina? -Nursing reported that the  patient was having some stool from the vagina today and has been cleaned up multiple times. -It could be possible that the patient was sitting in her own stool and now passing stool through the vagina but will need to rule out rectovaginal fistula so we will be obtaining a CT scan of the abdomen pelvis with contrast but she does have chronic blood loss and abnormal uterine bleeding, thickened endometrium but she is supposed to see gynecology for   Hypotension -Resolved.   Inadequate oral intake Estimated body mass index is 27.83 kg/m as calculated from the following:   Height as of this encounter: 5\' 7"  (1.702 m).   Weight as of this encounter: 80.6 kg. Nutrition Status: Nutrition Problem: Inadequate oral intake Etiology: dysphagia Signs/Symptoms: NPO status (pt with chronic G-tube)  Hypoalbuminemia -Patient's Albumin Trend: Recent Labs  Lab 01/26/23 1555 01/29/23 0456 01/30/23 0852 01/31/23 1032 02/01/23 1012 02/02/23 0305  ALBUMIN 3.4* 2.8* 2.9* 3.0* 3.0* 2.7*  -Continue to Monitor and Trend and repeat CMP in the AM   DVT prophylaxis: heparin injection 5,000 Units Start: 01/27/23 1400    Code Status: Full Code Family Communication: No family currently at bedside  Disposition Plan:  Level of care: Telemetry Medical Status is: Inpatient Remains inpatient appropriate because: Anticipating D/Cing in the next 24-48 hours   Consultants:  Neurology Palliative Care Medicine   Procedures:  As delineated as above  Antimicrobials:  Anti-infectives (From admission, onward)    Start     Dose/Rate Route Frequency Ordered Stop   01/31/23 1500  fosfomycin (MONUROL) packet 3 g  Status:  Discontinued        3 g Oral  Once 01/29/23 1118 01/31/23 0807   01/31/23 1500  fosfomycin (MONUROL) packet 3 g        3 g Per Tube  Once 01/31/23 0807 01/31/23 1448   01/29/23 1145  piperacillin-tazobactam (ZOSYN) IVPB 3.375 g  Status:  Discontinued        3.375 g 12.5 mL/hr over 240 Minutes  Intravenous Every 8 hours 01/29/23 1046 01/29/23 1046   01/29/23 1145  piperacillin-tazobactam (ZOSYN) IVPB 3.375 g  Status:  Discontinued        3.375 g 12.5 mL/hr over 240 Minutes Intravenous Every 8 hours 01/29/23 1057 01/29/23 1110   01/29/23 1145  piperacillin-tazobactam (ZOSYN) IVPB 3.375 g        3.375 g 12.5 mL/hr over 240 Minutes Intravenous Every 8 hours 01/29/23 1110 01/31/23 0941   01/26/23 2215  cefTRIAXone (ROCEPHIN) 1 g in sodium chloride 0.9 % 100 mL IVPB  Status:  Discontinued        1 g 200 mL/hr over 30 Minutes Intravenous Daily at bedtime 01/26/23 2202 01/29/23 1041       Subjective: Seen and examined at bedside and she remains nonverbal and appears calm.  No acute events noted by nursing but the nurse states that she may be having  some stool come out of her vagina.  Having multiple bowel movements but in the setting of her tube feedings.  She is afebrile and has no leukocytosis.  No other concerns or concerns at this time.  Objective: Vitals:   02/02/23 0346 02/02/23 0931 02/02/23 1119 02/02/23 1613  BP: (!) 151/84 129/79 133/66 (!) 165/88  Pulse: 74 66 62 67  Resp: 18 17 16 17   Temp: 99.1 F (37.3 C) 98.7 F (37.1 C) 98.7 F (37.1 C) 98.8 F (37.1 C)  TempSrc: Oral Axillary Axillary Axillary  SpO2: 100% 100% 95% 100%  Weight:      Height:        Intake/Output Summary (Last 24 hours) at 02/02/2023 1711 Last data filed at 02/02/2023 1537 Gross per 24 hour  Intake 1014 ml  Output 2100 ml  Net -1086 ml   Filed Weights   01/30/23 0539 01/31/23 1840 02/01/23 0529  Weight: 79.8 kg 79.9 kg 80.6 kg   Examination: Physical Exam:  Constitutional: WN/WD older chronically ill-appearing African-American female is nonverbal and does not really interact Respiratory: Diminished.  To auscultation bilaterally, no wheezing, rales, rhonchi or crackles. Normal respiratory effort and patient is not tachypenic. No accessory muscle use.  Unlabored breathing Cardiovascular:  RRR, no murmurs / rubs / gallops. S1 and S2 auscultated. No extremity edema Abdomen: Soft, non-tender, slightly distended secondary body habitus and has a G-tube in place with an abdominal binder. Bowel sounds positive.  GU: Deferred. Musculoskeletal: No clubbing / cyanosis of digits/nails. No joint deformity upper and lower extremities and was wearing a mitt on her left hand Skin: No rashes, lesions, ulcers on limited skin evaluation. No induration; Warm and dry.  Neurologic: Withdrawn and does not follow commands or localize or speak at all Psychiatric: Appears to be calm  Data Reviewed: I have personally reviewed following labs and imaging studies  CBC: Recent Labs  Lab 01/28/23 1716 01/29/23 0456 01/30/23 0852 01/31/23 1032 02/01/23 1012  WBC 5.5 5.7 8.9 7.9 6.3  NEUTROABS  --   --   --   --  3.8  HGB 7.9* 7.8* 8.5* 9.1* 8.6*  HCT 27.2* 26.2* 29.1* 30.5* 30.0*  MCV 69.4* 70.4* 70.1* 71.1* 72.1*  PLT 191 203 212 252 221   Basic Metabolic Panel: Recent Labs  Lab 01/29/23 0456 01/30/23 0852 01/31/23 1032 02/01/23 1012 02/02/23 0305  NA 134* 133* 135 138 136  K 3.3* 3.7 3.9 3.9 4.7  CL 104 103 103 101 105  CO2 20* 19* 21* 24 20*  GLUCOSE 117* 113* 112* 104* 84  BUN 7* <5* 5* <5* <5*  CREATININE 0.69 1.08* 0.79 0.76 0.47  CALCIUM 10.0 10.3 10.8* 11.2* 10.6*  MG 2.1 1.8 1.9 1.8 1.7  PHOS 2.0* 1.9* 2.1* 1.9* 3.8   GFR: Estimated Creatinine Clearance: 79.7 mL/min (by C-G formula based on SCr of 0.47 mg/dL). Liver Function Tests: Recent Labs  Lab 01/29/23 0456 01/30/23 0852 01/31/23 1032 02/01/23 1012 02/02/23 0305  AST  --   --   --  13* 21  ALT  --   --   --  12 7  ALKPHOS  --   --   --  52 49  BILITOT  --   --   --  0.3 0.6  PROT  --   --   --  7.5 6.6  ALBUMIN 2.8* 2.9* 3.0* 3.0* 2.7*   No results for input(s): "LIPASE", "AMYLASE" in the last 168 hours. No results for input(s): "AMMONIA" in  the last 168 hours. Coagulation Profile: No results for  input(s): "INR", "PROTIME" in the last 168 hours. Cardiac Enzymes: No results for input(s): "CKTOTAL", "CKMB", "CKMBINDEX", "TROPONINI" in the last 168 hours. BNP (last 3 results) No results for input(s): "PROBNP" in the last 8760 hours. HbA1C: No results for input(s): "HGBA1C" in the last 72 hours. CBG: Recent Labs  Lab 02/01/23 1205 02/01/23 1704 02/02/23 0023 02/02/23 0618 02/02/23 1121  GLUCAP 96 128* 103* 75 86   Lipid Profile: No results for input(s): "CHOL", "HDL", "LDLCALC", "TRIG", "CHOLHDL", "LDLDIRECT" in the last 72 hours. Thyroid Function Tests: No results for input(s): "TSH", "T4TOTAL", "FREET4", "T3FREE", "THYROIDAB" in the last 72 hours. Anemia Panel: No results for input(s): "VITAMINB12", "FOLATE", "FERRITIN", "TIBC", "IRON", "RETICCTPCT" in the last 72 hours. Sepsis Labs: Recent Labs  Lab 01/26/23 1932  LATICACIDVEN 2.2*    Recent Results (from the past 240 hour(s))  Blood Culture (routine x 2)     Status: None   Collection Time: 01/26/23  5:03 PM   Specimen: BLOOD  Result Value Ref Range Status   Specimen Description BLOOD LEFT ANTECUBITAL  Final   Special Requests   Final    BOTTLES DRAWN AEROBIC ONLY Blood Culture adequate volume   Culture   Final    NO GROWTH 5 DAYS Performed at [facility], [address], [place], [place] [address]    Report Status 01/31/2023 FINAL  Final  Blood Culture (routine x 2)     Status: None   Collection Time: 01/26/23  5:11 PM   Specimen: BLOOD  Result Value Ref Range Status   Specimen Description BLOOD RIGHT ANTECUBITAL  Final   Special Requests   Final    BOTTLES DRAWN AEROBIC ONLY Blood Culture results may not be optimal due to an inadequate volume of blood received in culture bottles   Culture   Final    NO GROWTH 5 DAYS Performed at [facility], [address], [place], [place] [address]    Report Status 01/31/2023 FINAL  Final  Resp panel by RT-PCR (RSV, Flu A&B, Covid) Anterior Nasal Swab     Status: None    Collection Time: 01/26/23  5:12 PM   Specimen: Anterior Nasal Swab  Result Value Ref Range Status   SARS Coronavirus 2 by RT PCR NEGATIVE NEGATIVE Final    Comment: (NOTE) SARS-CoV-2 target nucleic acids are NOT DETECTED.  The SARS-CoV-2 RNA is generally detectable in upper respiratory specimens during the acute phase of infection. The lowest concentration of SARS-CoV-2 viral copies this assay can detect is 138 copies/mL. A negative result does not preclude SARS-Cov-2 infection and should not be used as the sole basis for treatment or other patient management decisions. A negative result may occur with  improper specimen collection/handling, submission of specimen other than nasopharyngeal swab, presence of viral mutation(s) within the areas targeted by this assay, and inadequate number of viral copies(<138 copies/mL). A negative result must be combined with clinical observations, patient history, and epidemiological information. The expected result is Negative.  Fact Sheet for Patients:  BloggerCourse.com  Fact Sheet for Healthcare Providers:  SeriousBroker.it  This test is no t yet approved or cleared by the [place] FDA and  has been authorized for detection and/or diagnosis of SARS-CoV-2 by FDA under an Emergency Use Authorization (EUA). This EUA will remain  in effect (meaning this test can be used) for the duration of the COVID-19 declaration under Section 564(b)(1) of the Act, 21 U.S.C.section 360bbb-3(b)(1), unless the authorization  is terminated  or revoked sooner.       Influenza A by PCR NEGATIVE NEGATIVE Final   Influenza B by PCR NEGATIVE NEGATIVE Final    Comment: (NOTE) The Xpert Xpress SARS-CoV-2/FLU/RSV plus assay is intended as an aid in the diagnosis of influenza from Nasopharyngeal swab specimens and should not be used as a sole basis for treatment. Nasal washings and aspirates are unacceptable for  Xpert Xpress SARS-CoV-2/FLU/RSV testing.  Fact Sheet for Patients: BloggerCourse.com  Fact Sheet for Healthcare Providers: SeriousBroker.it  This test is not yet approved or cleared by the [place] FDA and has been authorized for detection and/or diagnosis of SARS-CoV-2 by FDA under an Emergency Use Authorization (EUA). This EUA will remain in effect (meaning this test can be used) for the duration of the COVID-19 declaration under Section 564(b)(1) of the Act, 21 U.S.C. section 360bbb-3(b)(1), unless the authorization is terminated or revoked.     Resp Syncytial Virus by PCR NEGATIVE NEGATIVE Final    Comment: (NOTE) Fact Sheet for Patients: BloggerCourse.com  Fact Sheet for Healthcare Providers: SeriousBroker.it  This test is not yet approved or cleared by the [place] FDA and has been authorized for detection and/or diagnosis of SARS-CoV-2 by FDA under an Emergency Use Authorization (EUA). This EUA will remain in effect (meaning this test can be used) for the duration of the COVID-19 declaration under Section 564(b)(1) of the Act, 21 U.S.C. section 360bbb-3(b)(1), unless the authorization is terminated or revoked.  Performed at [facility], [address], [place], [place] [address]   Urine Culture     Status: Abnormal   Collection Time: 01/26/23  6:04 PM   Specimen: Urine, Random  Result Value Ref Range Status   Specimen Description   Final    URINE, RANDOM Performed at [facility], [address] [place], [place] [address]    Special Requests   Final    NONE Performed at [facility], [address], [place], [place] [address]    Culture (A)  Final    >=100,000 COLONIES/mL ESCHERICHIA COLI Confirmed Extended Spectrum Beta-Lactamase Producer (ESBL).  In bloodstream infections from ESBL organisms, carbapenems are preferred over piperacillin/tazobactam. They  are shown to have a lower risk of mortality.    Report Status 01/29/2023 FINAL  Final   Organism ID, Bacteria ESCHERICHIA COLI (A)  Final      Susceptibility   Escherichia coli - MIC*    AMPICILLIN >=32 RESISTANT Resistant     CEFAZOLIN >=64 RESISTANT Resistant     CEFEPIME 16 RESISTANT Resistant     CEFTRIAXONE >=64 RESISTANT Resistant     CIPROFLOXACIN >=4 RESISTANT Resistant     GENTAMICIN >=16 RESISTANT Resistant     IMIPENEM <=0.25 SENSITIVE Sensitive     NITROFURANTOIN <=16 SENSITIVE Sensitive     TRIMETH/SULFA <=20 SENSITIVE Sensitive     AMPICILLIN/SULBACTAM 8 SENSITIVE Sensitive     PIP/TAZO <=4 SENSITIVE Sensitive     * >=100,000 COLONIES/mL ESCHERICHIA COLI  Culture, blood (Routine X 2) w Reflex to ID Panel     Status: None (Preliminary result)   Collection Time: 02/01/23 10:12 AM   Specimen: BLOOD RIGHT WRIST  Result Value Ref Range Status   Specimen Description BLOOD RIGHT WRIST  Final   Special Requests   Final    BOTTLES DRAWN AEROBIC AND ANAEROBIC Blood Culture adequate volume   Culture   Final    NO GROWTH < 24 HOURS Performed at [facility], [address], [place], [place] [address]    Report Status PENDING  Incomplete  Culture, blood (Routine X 2) w Reflex to ID Panel     Status: None (Preliminary result)   Collection Time: 02/01/23 10:12 AM   Specimen: BLOOD RIGHT HAND  Result Value Ref Range Status   Specimen Description BLOOD RIGHT HAND  Final   Special Requests   Final    BOTTLES DRAWN AEROBIC AND ANAEROBIC Blood Culture adequate volume   Culture   Final    NO GROWTH < 24 HOURS Performed at [facility], [address], [place], [place] [address]    Report Status PENDING  Incomplete    Radiology Studies: DG CHEST PORT 1 VIEW  Result Date: 02/01/2023 CLINICAL DATA:  Fever EXAM: PORTABLE CHEST 1 VIEW COMPARISON:  01/26/2023 FINDINGS: Stable cardiomediastinal contours. Aortic atherosclerosis. Subtly increased interstitial markings in the  right lower lobe. No pleural effusion or pneumothorax. IMPRESSION: Subtly increased interstitial markings in the right lower lobe, which may reflect atelectasis or developing infiltrate. Electronically Signed   By: Duanne Guess D.O.   On: 02/01/2023 13:27    Scheduled Meds:  feeding supplement (JEVITY 1.5 CAL/FIBER)  237 mL Per Tube 5 X Daily   fiber supplement (BANATROL TF)  60 mL Per Tube TID   free water  120 mL Per Tube 5 X Daily   free water  240 mL Per Tube BID   heparin  5,000 Units Subcutaneous Q8H   lacosamide  100 mg Per Tube BID   mouth rinse  15 mL Mouth Rinse 4 times per day   pantoprazole (PROTONIX) IV  40 mg Intravenous Q24H   valproic acid  750 mg Per Tube TID   Continuous Infusions:  sodium chloride 75 mL/hr at 02/02/23 1017    LOS: 7 days   Marguerita Merles, DO [facility] Available via Epic secure chat 7am-7pm After these hours, please refer to coverage provider listed on [email or web address] 02/02/2023, 5:11 PM

## 2023-02-02 NOTE — Plan of Care (Signed)

## 2023-02-03 ENCOUNTER — Inpatient Hospital Stay (HOSPITAL_COMMUNITY): Payer: Medicare Other

## 2023-02-03 DIAGNOSIS — N390 Urinary tract infection, site not specified: Secondary | ICD-10-CM | POA: Diagnosis not present

## 2023-02-03 DIAGNOSIS — I608 Other nontraumatic subarachnoid hemorrhage: Secondary | ICD-10-CM | POA: Diagnosis not present

## 2023-02-03 DIAGNOSIS — R569 Unspecified convulsions: Secondary | ICD-10-CM | POA: Diagnosis not present

## 2023-02-03 DIAGNOSIS — R9389 Abnormal findings on diagnostic imaging of other specified body structures: Secondary | ICD-10-CM | POA: Diagnosis not present

## 2023-02-03 DIAGNOSIS — R197 Diarrhea, unspecified: Secondary | ICD-10-CM

## 2023-02-03 LAB — CBC WITH DIFFERENTIAL/PLATELET
Abs Immature Granulocytes: 0.02 10*3/uL (ref 0.00–0.07)
Basophils Absolute: 0 10*3/uL (ref 0.0–0.1)
Basophils Relative: 0 %
Eosinophils Absolute: 0.1 10*3/uL (ref 0.0–0.5)
Eosinophils Relative: 2 %
HCT: 29.2 % — ABNORMAL LOW (ref 36.0–46.0)
Hemoglobin: 8.6 g/dL — ABNORMAL LOW (ref 12.0–15.0)
Immature Granulocytes: 0 %
Lymphocytes Relative: 33 %
Lymphs Abs: 1.7 10*3/uL (ref 0.7–4.0)
MCH: 21.2 pg — ABNORMAL LOW (ref 26.0–34.0)
MCHC: 29.5 g/dL — ABNORMAL LOW (ref 30.0–36.0)
MCV: 72.1 fL — ABNORMAL LOW (ref 80.0–100.0)
Monocytes Absolute: 0.6 10*3/uL (ref 0.1–1.0)
Monocytes Relative: 12 %
Neutro Abs: 2.8 10*3/uL (ref 1.7–7.7)
Neutrophils Relative %: 53 %
Platelets: 234 10*3/uL (ref 150–400)
RBC: 4.05 MIL/uL (ref 3.87–5.11)
RDW: 22.7 % — ABNORMAL HIGH (ref 11.5–15.5)
Smear Review: ADEQUATE
WBC: 5.3 10*3/uL (ref 4.0–10.5)
nRBC: 0 % (ref 0.0–0.2)

## 2023-02-03 LAB — COMPREHENSIVE METABOLIC PANEL
ALT: 8 U/L (ref 0–44)
AST: 11 U/L — ABNORMAL LOW (ref 15–41)
Albumin: 2.7 g/dL — ABNORMAL LOW (ref 3.5–5.0)
Alkaline Phosphatase: 49 U/L (ref 38–126)
Anion gap: 9 (ref 5–15)
BUN: 5 mg/dL — ABNORMAL LOW (ref 8–23)
CO2: 22 mmol/L (ref 22–32)
Calcium: 10.6 mg/dL — ABNORMAL HIGH (ref 8.9–10.3)
Chloride: 104 mmol/L (ref 98–111)
Creatinine, Ser: 0.54 mg/dL (ref 0.44–1.00)
GFR, Estimated: >60 mL/min (ref >60)
Glucose, Bld: 87 mg/dL (ref 70–99)
Potassium: 4 mmol/L (ref 3.5–5.1)
Sodium: 135 mmol/L (ref 135–145)
Total Bilirubin: 0.3 mg/dL (ref 0.3–1.2)
Total Protein: 6.6 g/dL (ref 6.5–8.1)

## 2023-02-03 LAB — GLUCOSE, CAPILLARY
Glucose-Capillary: 119 mg/dL — ABNORMAL HIGH (ref 70–99)
Glucose-Capillary: 124 mg/dL — ABNORMAL HIGH (ref 70–99)
Glucose-Capillary: 129 mg/dL — ABNORMAL HIGH (ref 70–99)
Glucose-Capillary: 93 mg/dL (ref 70–99)

## 2023-02-03 LAB — MRSA NEXT GEN BY PCR, NASAL: MRSA by PCR Next Gen: DETECTED — AB

## 2023-02-03 LAB — PHOSPHORUS: Phosphorus: 2.7 mg/dL (ref 2.5–4.6)

## 2023-02-03 LAB — MAGNESIUM: Magnesium: 1.9 mg/dL (ref 1.7–2.4)

## 2023-02-03 MED ORDER — HYDROXYZINE HCL 10 MG/5ML PO SYRP: 25.0000 mg | ORAL_SOLUTION | Freq: Four times a day (QID) | ORAL | Status: DC | PRN

## 2023-02-03 MED ORDER — LACOSAMIDE 100 MG PO TABS: 100.0000 mg | ORAL_TABLET | Freq: Two times a day (BID) | ORAL | 0 refills | Status: DC

## 2023-02-03 MED ORDER — MUPIROCIN 2 % EX OINT
1.0000 | TOPICAL_OINTMENT | Freq: Two times a day (BID) | CUTANEOUS | Status: DC
  Administered 2023-02-03 (×2): 1 via NASAL
  Filled 2023-02-03: qty 22

## 2023-02-03 MED ORDER — VALPROIC ACID 250 MG/5ML PO SOLN: 750.0000 mg | Freq: Three times a day (TID) | ORAL | Status: AC

## 2023-02-03 MED ORDER — IOHEXOL 350 MG/ML SOLN
75.0000 mL | Freq: Once | INTRAVENOUS | Status: AC | PRN
  Administered 2023-02-03: 75 mL via INTRAVENOUS

## 2023-02-03 MED ORDER — MUPIROCIN 2 % EX OINT: 1.0000 | TOPICAL_OINTMENT | Freq: Two times a day (BID) | CUTANEOUS | Status: DC

## 2023-02-03 MED ORDER — LORAZEPAM 1 MG PO TABS: 1.0000 mg | ORAL_TABLET | Freq: Four times a day (QID) | ORAL | 0 refills | Status: DC

## 2023-02-03 MED ORDER — CHLORHEXIDINE GLUCONATE CLOTH 2 % EX PADS
6.0000 | MEDICATED_PAD | Freq: Every day | CUTANEOUS | Status: DC
  Administered 2023-02-03: 6 via TOPICAL

## 2023-02-03 MED ORDER — ORAL CARE MOUTH RINSE: 15.0000 mL | Freq: Four times a day (QID) | OROMUCOSAL | Status: DC

## 2023-02-03 MED ORDER — QUETIAPINE FUMARATE 25 MG PO TABS: 25.0000 mg | ORAL_TABLET | Freq: Two times a day (BID) | ORAL | Status: DC | PRN

## 2023-02-03 MED ORDER — BANATROL TF EN LIQD: 60.0000 mL | Freq: Three times a day (TID) | ENTERAL | Status: AC

## 2023-02-03 NOTE — Progress Notes (Signed)
MRSA+, protocol initiated

## 2023-02-03 NOTE — Progress Notes (Signed)
Discharge report called to [facility] [phone]; given to receiving nurse [facility], LPN.  AVS placed in discharge packet by Reino Kent.

## 2023-02-03 NOTE — Plan of Care (Signed)
Problem: Education: Goal: Knowledge of General Education information will improve Description Including pain rating scale, medication(s)/side effects and non-pharmacologic comfort measures Outcome: Progressing   Problem: Nutrition: Goal: Adequate nutrition will be maintained Outcome: Progressing   Problem: Coping: Goal: Level of anxiety will decrease Outcome: Progressing   Problem: Skin Integrity: Goal: Risk for impaired skin integrity will decrease Outcome: Progressing   

## 2023-02-03 NOTE — Discharge Summary (Addendum)
Physician Discharge Summary   Patient: Denise Savage MRN: [id number] DOB: 06-12-1961  Admit date:     01/26/2023  Discharge date: 02/03/23  Discharge Physician: Marguerita Merles, DO   PCP: Babs Sciara, MD   Recommendations at discharge:   Follow-up with PCP within 1 to 2 weeks and repeat CBC, CMP, mag, Phos within 1 week Follow-up with neurology in outpatient setting and continue with seizure precautions  Discharge Diagnoses: Principal Problem:   Seizures (HCC) Active Problems:   UTI (urinary tract infection)   Aneurysmal subarachnoid hemorrhage (HCC)   Thickened endometrium  Resolved Problems:   * No resolved hospital problems. *  Hospital Course: 62 year old F with PMH of previous hemorrhagic stroke with right-sided hemiplegia, aphasia and dysphagia with tube feed dependence, subarachnoid hemorrhage, seizure disorder, anxiety, depression, GERD and embolic PE/paroxysmal A-fib not on anticoagulation due to blood loss anemia from abnormal uterine bleed brought to [facility] ED due to tremoring of bilateral upper extremities, crying out when approached and touched.  Patient is nonverbal.    On arrival to ED, reportedly postictal and hypotensive. Had lactic acidosis to 3.5.  Hypotension and lactic acidosis resolved quickly with IV fluid.  Foley catheter inserted at [facility] ED.  UA with pyuria. Cultures obtained and started on ceftriaxone.  CT head without acute finding.  Teleneurology consulted and recommended transfer to [facility] for continuous EEG.   Patient arrived at [facility] on 8/16.  Continuous EEG negative for seizure but suggests cortical dysfunction arising from left frontal region likely consistent with underlying craniotomy, and moderate diffuse encephalopathy.  Initially home Keppra was increased to 1000 mg twice daily but discontinued given concern for agitation/behavioral disturbance.  She was started on Vimpat and Depakote.  She had 2 brief episodes of witnessed seizure on 8/19.  She was loaded  with Depakote followed by increased dose of Depakote.  LTM EEG unchanged from prior.  Neurology following and appreciate their evaluation and they have changed her to oral antiepileptics.   Urine culture with ESBL E. coli but sensitive to Zosyn.  Antibiotic changed to IV Zosyn on 8/18 and completed course with p.o. fosfomycin on 8/20.    Palliative medicine consulted.  Remains full code with full scope of care. Spiked a Temp 3 Days ago and Calcium was trending up so will reinitiate IVF and monitor and repeat blood culture (Negative so far).  Was planning on discharging yesterday but nurse feels she is having some stool coming out of the vagina today however this could be the bloody discharge if she has more thickened and abnormal uterine bleeding. Obtained a CT scan of the abdomen pelvis and it ruled out rectovaginal fistula. She was having some diarrhea but attributed to Sorbitol Intolerance as the Valporic Acid had Sorbitol in it. Diarrhea also was improving. She is stable for D/C to SNF and will need to follow up with PCP and Neurology in the outpatient setting and she will need to have Seizure Precautions maintained.   Assessment and Plan:  Seizure disorder with concern for breakthrough seizure -Brought to [facility] ED due to tremoring of bilateral upper extremities, crying out when approached and touched.   -Reportedly postictal when she arrived in ED.   -She had received benzo and route to ED.   -UDS is positive for benzo.   -Had lactic acidosis to 3.5. CT head without acute finding.   -UA with pyuria.  VPA level subtherapeutic at 17 on admission.  -Continuous EEG without seizure or epileptiform but cortical dysfunction as  above.  Continuously crying but distractible (chronic per family).  -Patient had another 2 brief seizure episodes on 8/19.   -Continuous EEG without new finding. -Appreciate input by neurology: -LTM EEG discontinued -Lorazepam 1 to 2 mg as needed for seizure -Continue Vimpat  100mg  BID and this has been changed to p.o. -Increase Depakote to 750 mg every 8 hours and this is also been changed to p.o. for the PEG tube -Maintain seizure precautions -Keppra discontinued due to behavioral disturbance/agitation -Resume Home Lorazepam at D/C  -Continue Seizure Precautions and close monitoring appreiacted Neurology Evaluation. She is stable to D/C and will need to follow up with Neurology in the outpatient setting.   Agitation, improved -Continuously crying but distractible.  Per daughter, [name] this is normal for her especially when family members are not around.  Her son usually visits when she was in [place].  He cannot drive to come to [facility]. -C/w Reorientation and delirium precaution -P.o. Seroquel 25 mg twice daily as needed for agitation. Dr. Alanda Slim discussed with [name] about this. -C/w Home Meds as well with Nuedexta    ESBL E. coli UTI with recurrent fever, improved  -Patient is nonverbal.  Solely based on urine studies.   -Has no fever or leukocytosis.  Urine culture with ESBL E. coli. -Ceftriaxone 8/15>> IV Zosyn 8/18>> p.o. fosfomycin on 8/20 -Her temperature is improved but a few days ago she did have a Tmax of 100.5 but resolved  -WBC Normal -Given her recurrent fever we have repeated cultures -Repeat blood cultures x 2 showing no growth to date at 2 Days -Repeat urinalysis done and showed a hazy appearance with large leukocytes, negative nitrites, rare bacteria, greater than 50 WBCs -Repeat chest x-ray done showed "Subtly increased interstitial markings in the right lower lobe, which may reflect atelectasis or developing infiltrate." -CT Abd/Pelvis done and showed "Bladder lumen gas and wall thickening correlating with history of cystitis. Bladder calculi have decreased in number from 07/13/2022. Bilateral nephrolithiasis. No specific signs of upper tract infection. No indication of rectovaginal fistula." -Adequately treated for UTI and will need follow up  with Urology as an outpatient for Nephrolithiasis and Bladder Calculi    AKI: -Baseline Cr ~0.6-0.7.  Likely due to hypotension.  Resolved. -BUN/Cr Trend: Recent Labs  Lab 01/28/23 1710 01/29/23 0456 01/30/23 0852 01/31/23 1032 02/01/23 1012 02/02/23 0305 02/03/23 0506  BUN 5* 7* <5* 5* <5* <5* 5*  CREATININE 0.79 0.69 1.08* 0.79 0.76 0.47 0.54  -Avoid Nephrotoxic Medications, Contrast Dyes, Hypotension and Dehydration to Ensure Adequate Renal Perfusion and will need to Renally Adjust Meds -Continue to Monitor and Trend Renal Function carefully and repeat CMP in the AM     Hypercalcemia -Given bolus 500 mL and re-initiated IVF with NS at 75 mL/hr but will stop today and resume [employer]  Recent Labs  Lab 01/28/23 1710 01/29/23 0456 01/30/23 0852 01/31/23 1032 02/01/23 1012 02/02/23 0305 02/03/23 0506  CALCIUM 10.4* 10.0 10.3 10.8* 11.2* 10.6* 10.6*  -Continue to Monitor and Trend and Repeat CMP in the AM as calcium is improving  History of Aneurysm and SAH/ICH s/p craniotomy. -Bedbound and nonverbal.  Dysphagia with PEG tube dependence -N.p.o. -Appreciate help by dietitian with tube feeds and protocol per their recommendations and c/w Banatrol   Diarrhea -Likely Non-infectious as she is afebrile and has no WBC -In the setting of Sorbitol Intolerance and TF -Continue to Monitor and has Banatrol  -Was going to check GI Pathogen Panel but will cancel given that  she has not had any further bowel movements -Continue to Monitor carefully    Iron deficiency and anemia due to chronic blood loss in the setting of abnormal uterine bleed and thickened endometrium -No obvious bleeding currently.  Some element of hemodilution.   -Anemia panel with iron deficiency iron level was 12, UIBC was 303, TIBC was 315, saturation ratios were 4%, ferritin level 6, folate of 25.5, and vitamin B12 level was 810 Recent Labs  Lab 01/28/23 1716 01/29/23 0456 01/30/23 0852  01/31/23 1032 02/01/23 1012 02/02/23 1657 02/03/23 0506  HGB 7.9* 7.8* 8.5* 9.1* 8.6* 9.4* 8.6*  HCT 27.2* 26.2* 29.1* 30.5* 30.0* 31.6* 29.2*  MCV 69.4* 70.4* 70.1* 71.1* 72.1* 71.7* 72.1*  -IV ferric gluconate 250 mg daily x 2.  Patient's condition makes it difficult for outpatient iron infusion. -On Depo-Provera injection outpatient -CT scan of the abdomen pelvis done and showed "Heterogeneous enlargement of the uterus, with globular shape. No discrete mass is seen. Less pronounced thickness of the endometrium when compared to before with faint calcific density measuring 3 mm at the lower endometrial canal, nonspecific. No indication of rectovaginal fistula" -Plan was for outpatient follow-up with gynecology when she was discharged in February 2024   Advance care planning: -Patient with comorbidity as above.  Still full code.   -Dr. Alanda Slim really doubt if cardiopulmonary resuscitation is to the patient's best interest if we end up in the situation.  Attempted to call both daughters listed as emergency contact but no answer. -Palliative medicine consulted-remains full code with full scope of care as of now.   Lactic Acidosis -Multifactorial including possible seizure, hypotension or infection.  -Resolving as LA Level trend: Recent Labs  Lab 01/26/23 1555 01/26/23 1932  LATICACIDVEN 3.5* 2.2*  -Follow-up in the outpatient setting if necessary  Hypophosphatemia Hypokalemia -Phos and K+ Trend: Recent Labs  Lab 01/29/23 0456 01/30/23 0852 01/31/23 1032 02/01/23 1012 02/02/23 0305 02/03/23 0506  PHOS 2.0* 1.9* 2.1* 1.9* 3.8 2.7  K 3.3* 3.7 3.9 3.9 4.7 4.0  -Continue to Monitor and Replete as Necessary -Repeat CMP and Phos Level within 1 week  Stool from Vagina?,  Ruled out -Nursing reported that the patient was having some stool from the vagina today and has been cleaned up multiple times. -It could be possible that the patient was sitting in her own stool and now passing  stool through the vagina but will need to rule out rectovaginal fistula so we will be obtaining a CT scan of the abdomen pelvis with contrast but she does have chronic blood loss and abnormal uterine bleeding, thickened endometrium but she is supposed to see gynecology for -CT scan of the abdomen pelvis done and showed "Bladder lumen gas and wall thickening correlating with history of cystitis. Bladder calculi have decreased in number from 07/13/2022.2. Bilateral nephrolithiasis. No specific signs of upper tract infection. No indication of rectovaginal fistula." -Will need to follow-up with gynecology in outpatient setting   Hypotension -Resolved.  Aneurysmal subarachnoid hemorrhage (HCC) Patient has significant history of Dysphagia ICH (intracerebral hemorrhage) (HCC) Aneurysmal subarachnoid hemorrhage (HCC) SAH (subarachnoid hemorrhage) (HCC) Status post craniectomy -Currently bedbound, nonverbal -Continue with supportive care -Strictly n.p.o.; feeding and meds via PEG tube, resumed    Inadequate oral intake Estimated body mass index is 27.72 kg/m as calculated from the following:   Height as of this encounter: 5\' 7"  (1.702 m).   Weight as of this encounter: 80.3 kg. Nutrition Status: Nutrition Problem: Inadequate oral intake Etiology: dysphagia Signs/Symptoms: NPO  status (pt with chronic G-tube)  Hypoalbuminemia -Patient's Albumin Trend: Recent Labs  Lab 01/26/23 1555 01/29/23 0456 01/30/23 0852 01/31/23 1032 02/01/23 1012 02/02/23 0305 02/03/23 0506  ALBUMIN 3.4* 2.8* 2.9* 3.0* 3.0* 2.7* 2.7*  -Continue to Monitor and Trend and repeat CMP in the AM  Nutrition Documentation    Flowsheet Row ED to Hosp-Admission (Current) from 01/26/2023 in [facility]  Nutrition Problem Inadequate oral intake  Etiology dysphagia  Nutrition Goal Patient will meet greater than or equal to 90% of their needs      Consultants: Palliative Care Medicine,  Neurology Procedures performed: LTM EEG  Disposition: Long term care facility Diet recommendation:  NPO - Nutrition through PEG DISCHARGE MEDICATION: Allergies as of 02/03/2023       Reactions   Hydrocodone Nausea Only        Medication List     STOP taking these medications    lacosamide 10 MG/ML oral solution Commonly known as: VIMPAT Replaced by: Lacosamide 100 MG Tabs   levETIRAcetam 100 MG/ML solution Commonly known as: KEPPRA       TAKE these medications    acetaminophen 325 MG tablet Commonly known as: TYLENOL Place 650 mg into feeding tube in the morning, at noon, in the evening, and at bedtime. (0000, 0600, 1200, 1800)   fiber supplement (BANATROL TF) liquid Place 60 mLs into feeding tube 3 (three) times daily.   free water Soln Place 30-240 mLs into feeding tube See admin instructions. Flush feeding tube with 240 mls of water twice daily between feedings, flush with 60 ml of water before and after initiating feedings &  Flush peg tube with 30 cc of water before and after medication administration   glycopyrrolate 1 MG tablet Commonly known as: ROBINUL Take 1 mg by mouth every 8 (eight) hours as needed (increased secretions).   hydrOXYzine 10 MG/5ML syrup Commonly known as: ATARAX Place 12.5 mLs (25 mg total) into feeding tube every 6 (six) hours as needed for anxiety.   Isosource 1.5 Cal Liqd Place 237 mLs into feeding tube 5 (five) times daily. (0600, 1000, 1400, 1800 & 2200)   Lacosamide 100 MG Tabs Place 1 tablet (100 mg total) into feeding tube 2 (two) times daily. Replaces: lacosamide 10 MG/ML oral solution   LORazepam 1 MG tablet Commonly known as: ATIVAN Place 1 tablet (1 mg total) into feeding tube in the morning, at noon, in the evening, and at bedtime. (0000, 0600, 1200, 1800)   medroxyPROGESTERone 150 MG/ML injection Commonly known as: DEPO-PROVERA Inject 1 mL (150 mg total) into the muscle every 3 (three) months.   Melatonin 10 MG  Tabs Take 10 mg by mouth at bedtime. (2100)   mouth rinse Liqd solution 15 mLs by Mouth Rinse route in the morning, at noon, in the evening, and at bedtime.   mupirocin ointment 2 % Commonly known as: BACTROBAN Place 1 Application into the nose 2 (two) times daily.   Nuedexta 20-10 MG capsule Generic drug: Dextromethorphan-quiNIDine 1 capsule every 12 (twelve) hours. Via G-Tube (0900 & 2100)   omeprazole 2 mg/mL Susp oral suspension Commonly known as: KONVOMEP Place 20 mg into feeding tube daily.   polyethylene glycol 17 g packet Commonly known as: MIRALAX / GLYCOLAX Take 17 g by mouth 2 (two) times daily.   QUEtiapine 25 MG tablet Commonly known as: SEROQUEL Place 1 tablet (25 mg total) into feeding tube 2 (two) times daily as needed (Agitation).   traZODone 50 MG tablet  Commonly known as: DESYREL Take 75 mg by mouth at bedtime. (2200)   valproic acid 250 MG/5ML solution Commonly known as: DEPAKENE Place 15 mLs (750 mg total) into feeding tube 3 (three) times daily. What changed:  how much to take when to take this additional instructions        Contact information for after-discharge care     Destination     [facility] .   Service: Skilled Nursing Contact information: [address] [place] [address] [phone]                    Discharge Exam: Ceasar Mons Weights   01/31/23 1840 02/01/23 0529 02/03/23 0500  Weight: 79.9 kg 80.6 kg 80.3 kg   Vitals:   02/03/23 0819 02/03/23 1109  BP: (!) 145/77 (!) 159/81  Pulse: 81 81  Resp: 17 19  Temp: 99.4 F (37.4 C) 99.1 F (37.3 C)  SpO2: 100% 100%   Examination: Physical Exam:  Constitutional: WN/WD older appearing chronically ill-appearing African-American female who is nonverbal and does not really interact but had her eyes open and was awake Respiratory: Diminished to auscultation bilaterally with some coarse breath sounds, no  wheezing, rales, rhonchi or crackles. Normal respiratory effort and patient is not tachypenic. No accessory muscle use.  Unlabored breathing Cardiovascular: RRR, no murmurs / rubs / gallops. S1 and S2 auscultated. No extremity edema.  Abdomen: Soft, non-tender, distended secondary body habitus and has a G-tube in place.. Bowel sounds positive.  GU: Deferred. Musculoskeletal: No clubbing / cyanosis of digits/nails. No joint deformity upper and lower extremities limited skin evaluation.  Skin: No rashes, lesions, ulcers on limited skin evaluation. No induration; Warm and dry.  Neurologic: Awake but does not follow commands or verbalize or speak Psychiatric: Appears calm and is not crying  Condition at discharge: stable  The results of significant diagnostics from this hospitalization (including imaging, microbiology, ancillary and laboratory) are listed below for reference.   Imaging Studies: CT ABDOMEN PELVIS W CONTRAST  Result Date: 02/03/2023 CLINICAL DATA:  Recurrent/complicated UTI. Question rectovaginal fistula. EXAM: CT ABDOMEN AND PELVIS WITH CONTRAST TECHNIQUE: Multidetector CT imaging of the abdomen and pelvis was performed using the standard protocol following bolus administration of intravenous contrast. RADIATION DOSE REDUCTION: This exam was performed according to the departmental dose-optimization program which includes automated exposure control, adjustment of the mA and/or kV according to patient size and/or use of iterative reconstruction technique. CONTRAST:  75mL OMNIPAQUE IOHEXOL 350 MG/ML SOLN COMPARISON:  07/13/2022 FINDINGS: Lower chest:  Mild atelectasis in the left lower lobe. Hepatobiliary: No suspicious liver finding. Generous sized lymph nodes in the deep liver drainage, unchanged and likely reactive with this pattern.No evidence of biliary obstruction or stone. Pancreas: Unremarkable. Spleen: Unremarkable. Adrenals/Urinary Tract: Negative adrenals. No hydronephrosis or  ureteral calculus. Bilateral renal calculi numbering at least 4 on the right and 3 on the left, measuring up to 6 mm at the upper pole left kidney. Simple cyst at the lower pole kidney on the left measuring 3.3 cm. No follow-up imaging is recommended. Gas in the urinary bladder with prominent wall thickness which may relate to history of UTI. Two calcifications in the dependent right bladder measuring up to 4 mm, likely luminal and mobile when compared to prior. Stomach/Bowel: No obstruction. No visible bowel inflammation. Percutaneous gastrostomy tube. Vascular/Lymphatic: No acute vascular abnormality. Multifocal atheromatous calcification. No mass or adenopathy. Reproductive:Heterogeneous enlargement of the uterus, with globular shape. No discrete  mass is seen. Less pronounced thickness of the endometrium when compared to before with faint calcific density measuring 3 mm at the lower endometrial canal, nonspecific. No indication of rectovaginal fistula Other: No ascites or pneumoperitoneum. Midline hernia repair using mesh. Medication injection site seen in the subcutaneous anterior abdominal wall. Musculoskeletal: No acute abnormalities. IMPRESSION: 1. Bladder lumen gas and wall thickening correlating with history of cystitis. Bladder calculi have decreased in number from 07/13/2022. 2. Bilateral nephrolithiasis. No specific signs of upper tract infection. 3. No indication of rectovaginal fistula. Electronically Signed   By: Tiburcio Pea M.D.   On: 02/03/2023 12:55   DG CHEST PORT 1 VIEW  Result Date: 02/01/2023 CLINICAL DATA:  Fever EXAM: PORTABLE CHEST 1 VIEW COMPARISON:  01/26/2023 FINDINGS: Stable cardiomediastinal contours. Aortic atherosclerosis. Subtly increased interstitial markings in the right lower lobe. No pleural effusion or pneumothorax. IMPRESSION: Subtly increased interstitial markings in the right lower lobe, which may reflect atelectasis or developing infiltrate. Electronically Signed    By: Duanne Guess D.O.   On: 02/01/2023 13:27   Overnight EEG with video  Result Date: 01/31/2023 Charlsie Quest, MD     01/31/2023 12:43 PM Patient Name: MADYLINE DICKE MRN: [id number] Epilepsy Attending: Charlsie Quest Referring Physician/Provider: Jefferson Fuel, MD Duration: 01/30/2023 1617 to 01/31/2023 1147  Patient history: 62 year old patient with history of seizures, previous hemorrhagic stroke with right-sided hemiplegia and aphasia, subarachnoid hemorrhage who presents with an episode of unresponsiveness and seizure-like activity. On exam, she is noted to have tremoring of bilateral upper extremities and is nonverbal, crying out when approached or touched.  She cannot follow commands but will occasionally mimic. EEG to evaluate for seizure.  Level of alertness: awake/lethargic  AEDs during EEG study: LCM, VPA  Technical aspects: This EEG study was done with scalp electrodes positioned according to the 10-20 International system of electrode placement. Electrical activity was reviewed with band pass filter of 1-70Hz , sensitivity of 7 uV/mm, display speed of 28mm/sec with a 60Hz  notched filter applied as appropriate. EEG data were recorded continuously and digitally stored.  Video monitoring was available and reviewed as appropriate.  Description: The posterior dominant rhythm consists of 7 Hz activity of moderate voltage (25-35 uV) seen predominantly in posterior head regions, symmetric and reactive to eye opening and eye closing. EEG showed continuous generalized 3 to 6 Hz theta-delta slowing. There was also sharply contoured 5-7z theta slowing in left frontal region consistent with craniotomy. Hyperventilation and photic stimulation were not performed.    ABNORMALITY - Breach artifact, left frontal region - Continuous slow, generalized and lateralized left hemisphere  IMPRESSION: This study is suggestive of cortical dysfunction arising from left hemisphere maximal left frontal region likely  consistent with underlying encephalomalacia and craniotomy. Additionally there is moderate diffuse encephalopathy. No seizures or definite epileptiform discharges were seen throughout the recording.  Priyanka [name]  Overnight EEG with video  Result Date: 01/28/2023 Charlsie Quest, MD     01/29/2023  7:52 AM Patient Name: MALASIA POLO MRN: [id number] Epilepsy Attending: Charlsie Quest Referring Physician/Provider: Jefferson Fuel, MD Duration: 01/27/2023 1807 to 01/28/2023 1807 Patient history: 62 year old patient with history of seizures, previous hemorrhagic stroke with right-sided hemiplegia and aphasia, subarachnoid hemorrhage who presents with an episode of unresponsiveness and seizure-like activity. On exam, she is noted to have tremoring of bilateral upper extremities and is nonverbal, crying out when approached or touched.  She cannot follow commands but will occasionally mimic. EEG to evaluate for  seizure. Level of alertness: awake/lethargic AEDs during EEG study: LCM, VPA Technical aspects: This EEG study was done with scalp electrodes positioned according to the 10-20 International system of electrode placement. Electrical activity was reviewed with band pass filter of 1-70Hz , sensitivity of 7 uV/mm, display speed of 59mm/sec with a 60Hz  notched filter applied as appropriate. EEG data were recorded continuously and digitally stored.  Video monitoring was available and reviewed as appropriate. Description: The posterior dominant rhythm consists of 7 Hz activity of moderate voltage (25-35 uV) seen predominantly in posterior head regions, symmetric and reactive to eye opening and eye closing. EEG showed continuous generalized 3 to 6 Hz theta-delta slowing. There was also sharply contoured 5-7z theta slowing in left frontal region consistent with craniotomy. Hyperventilation and photic stimulation were not performed.   Around 2040 on 01/27/2023, patient was noted to have right upper extremity tremor  like activity. She was then noted to be crying followed by extension of right arm. Concomitant eeg before, during and after the event didn't show any eeg change to suggest seizure, ABNORMALITY - Breach artifact, left frontal region - Continuous slow, generalized IMPRESSION: This study is suggestive of cortical dysfunction arising from left frontal region likely consistent with underlying craniotomy. Additionally there is moderate diffuse encephalopathy. No seizures or definite epileptiform discharges were seen throughout the recording. Around 2040 on 01/27/2023, patient was noted to have right upper extremity tremor like activity. She was then noted to be crying followed by extension of right arm without concomitant eeg change. This was most likely not an epileptic event. However, focal motor seizure may not seen on scalp EEG.Therefore clinical correlation is recommended. Charlsie Quest   DG Chest Port 1 View  Result Date: 01/26/2023 CLINICAL DATA:  Weakness EXAM: PORTABLE CHEST 1 VIEW COMPARISON:  07/13/2022 x-ray and older FINDINGS: Stable cardiopericardial silhouette. No consolidation, pneumothorax or effusion. No edema. Overlapping cardiac leads. Degenerative changes seen along the spine and shoulders IMPRESSION: No acute cardiopulmonary disease. Electronically Signed   By: Karen Kays M.D.   On: 01/26/2023 17:56   CT HEAD WO CONTRAST ( )  Result Date: 01/26/2023 CLINICAL DATA:  Neuro deficit, acute, stroke suspected EXAM: CT HEAD WITHOUT CONTRAST TECHNIQUE: Contiguous axial images were obtained from the base of the skull through the vertex without intravenous contrast. RADIATION DOSE REDUCTION: This exam was performed according to the departmental dose-optimization program which includes automated exposure control, adjustment of the mA and/or kV according to patient size and/or use of iterative reconstruction technique. COMPARISON:  CT head 07/13/2022. FINDINGS: Brain: Left cerebral  encephalomalacia. Similar ex vacuo ventricular dilation on the left. No evidence of acute hemorrhage, mass lesion or new midline shift. No hydrocephalus. Vascular: Embolization coils in the left MCA. Skull: Left-sided craniotomy.  No acute fracture. Sinuses/Orbits: Clear sinuses.  No acute findings. Other: No mastoid effusions. IMPRESSION: 1. No evidence of acute intracranial abnormality. 2. Unchanged left cerebral encephalomalacia. Electronically Signed   By: Feliberto Harts M.D.   On: 01/26/2023 17:42    Microbiology: Results for orders placed or performed during the hospital encounter of 01/26/23  Blood Culture (routine x 2)     Status: None   Collection Time: 01/26/23  5:03 PM   Specimen: BLOOD  Result Value Ref Range Status   Specimen Description BLOOD LEFT ANTECUBITAL  Final   Special Requests   Final    BOTTLES DRAWN AEROBIC ONLY Blood Culture adequate volume   Culture   Final    NO GROWTH 5 DAYS Performed  at [facility], [address] [place], [place] [address]    Report Status 01/31/2023 FINAL  Final  Blood Culture (routine x 2)     Status: None   Collection Time: 01/26/23  5:11 PM   Specimen: BLOOD  Result Value Ref Range Status   Specimen Description BLOOD RIGHT ANTECUBITAL  Final   Special Requests   Final    BOTTLES DRAWN AEROBIC ONLY Blood Culture results may not be optimal due to an inadequate volume of blood received in culture bottles   Culture   Final    NO GROWTH 5 DAYS Performed at [facility], [address] [place], [place] [address]    Report Status 01/31/2023 FINAL  Final  Resp panel by RT-PCR (RSV, Flu A&B, Covid) Anterior Nasal Swab     Status: None   Collection Time: 01/26/23  5:12 PM   Specimen: Anterior Nasal Swab  Result Value Ref Range Status   SARS Coronavirus 2 by RT PCR NEGATIVE NEGATIVE Final    Comment: (NOTE) SARS-CoV-2 target nucleic acids are NOT DETECTED.  The SARS-CoV-2 RNA is generally detectable in upper respiratory specimens during  the acute phase of infection. The lowest concentration of SARS-CoV-2 viral copies this assay can detect is 138 copies/mL. A negative result does not preclude SARS-Cov-2 infection and should not be used as the sole basis for treatment or other patient management decisions. A negative result may occur with  improper specimen collection/handling, submission of specimen other than nasopharyngeal swab, presence of viral mutation(s) within the areas targeted by this assay, and inadequate number of viral copies(<138 copies/mL). A negative result must be combined with clinical observations, patient history, and epidemiological information. The expected result is Negative.  Fact Sheet for Patients:  BloggerCourse.com  Fact Sheet for Healthcare Providers:  SeriousBroker.it  This test is no t yet approved or cleared by the [place] FDA and  has been authorized for detection and/or diagnosis of SARS-CoV-2 by FDA under an Emergency Use Authorization (EUA). This EUA will remain  in effect (meaning this test can be used) for the duration of the COVID-19 declaration under Section 564(b)(1) of the Act, 21 U.S.C.section 360bbb-3(b)(1), unless the authorization is terminated  or revoked sooner.       Influenza A by PCR NEGATIVE NEGATIVE Final   Influenza B by PCR NEGATIVE NEGATIVE Final    Comment: (NOTE) The Xpert Xpress SARS-CoV-2/FLU/RSV plus assay is intended as an aid in the diagnosis of influenza from Nasopharyngeal swab specimens and should not be used as a sole basis for treatment. Nasal washings and aspirates are unacceptable for Xpert Xpress SARS-CoV-2/FLU/RSV testing.  Fact Sheet for Patients: BloggerCourse.com  Fact Sheet for Healthcare Providers: SeriousBroker.it  This test is not yet approved or cleared by the [place] FDA and has been authorized for detection and/or  diagnosis of SARS-CoV-2 by FDA under an Emergency Use Authorization (EUA). This EUA will remain in effect (meaning this test can be used) for the duration of the COVID-19 declaration under Section 564(b)(1) of the Act, 21 U.S.C. section 360bbb-3(b)(1), unless the authorization is terminated or revoked.     Resp Syncytial Virus by PCR NEGATIVE NEGATIVE Final    Comment: (NOTE) Fact Sheet for Patients: BloggerCourse.com  Fact Sheet for Healthcare Providers: SeriousBroker.it  This test is not yet approved or cleared by the [place] FDA and has been authorized for detection and/or diagnosis of SARS-CoV-2 by FDA under an Emergency Use Authorization (EUA). This EUA will remain in effect (meaning  this test can be used) for the duration of the COVID-19 declaration under Section 564(b)(1) of the Act, 21 U.S.C. section 360bbb-3(b)(1), unless the authorization is terminated or revoked.  Performed at [facility], [address] [place], [place] [address]   Urine Culture     Status: Abnormal   Collection Time: 01/26/23  6:04 PM   Specimen: Urine, Random  Result Value Ref Range Status   Specimen Description   Final    URINE, RANDOM Performed at [facility], [address], [place], [place] [address]    Special Requests   Final    NONE Performed at [facility], [address] [place], [place] [address]    Culture (A)  Final    >=100,000 COLONIES/mL ESCHERICHIA COLI Confirmed Extended Spectrum Beta-Lactamase Producer (ESBL).  In bloodstream infections from ESBL organisms, carbapenems are preferred over piperacillin/tazobactam. They are shown to have a lower risk of mortality.    Report Status 01/29/2023 FINAL  Final   Organism ID, Bacteria ESCHERICHIA COLI (A)  Final      Susceptibility   Escherichia coli - MIC*    AMPICILLIN >=32 RESISTANT Resistant     CEFAZOLIN >=64 RESISTANT Resistant     CEFEPIME 16 RESISTANT Resistant      CEFTRIAXONE >=64 RESISTANT Resistant     CIPROFLOXACIN >=4 RESISTANT Resistant     GENTAMICIN >=16 RESISTANT Resistant     IMIPENEM <=0.25 SENSITIVE Sensitive     NITROFURANTOIN <=16 SENSITIVE Sensitive     TRIMETH/SULFA <=20 SENSITIVE Sensitive     AMPICILLIN/SULBACTAM 8 SENSITIVE Sensitive     PIP/TAZO <=4 SENSITIVE Sensitive     * >=100,000 COLONIES/mL ESCHERICHIA COLI  Culture, blood (Routine X 2) w Reflex to ID Panel     Status: None (Preliminary result)   Collection Time: 02/01/23 10:12 AM   Specimen: BLOOD RIGHT WRIST  Result Value Ref Range Status   Specimen Description BLOOD RIGHT WRIST  Final   Special Requests   Final    BOTTLES DRAWN AEROBIC AND ANAEROBIC Blood Culture adequate volume   Culture   Final    NO GROWTH 2 DAYS Performed at [facility], [address], [place], [place] [address]    Report Status PENDING  Incomplete  Culture, blood (Routine X 2) w Reflex to ID Panel     Status: None (Preliminary result)   Collection Time: 02/01/23 10:12 AM   Specimen: BLOOD RIGHT HAND  Result Value Ref Range Status   Specimen Description BLOOD RIGHT HAND  Final   Special Requests   Final    BOTTLES DRAWN AEROBIC AND ANAEROBIC Blood Culture adequate volume   Culture   Final    NO GROWTH 2 DAYS Performed at [facility], [address], [place], [place] [address]    Report Status PENDING  Incomplete  MRSA Next Gen by PCR, Nasal     Status: Abnormal   Collection Time: 02/02/23 10:06 PM   Specimen: Nasal Mucosa; Nasal Swab  Result Value Ref Range Status   MRSA by PCR Next Gen DETECTED (A) NOT DETECTED Final    Comment: RESULT CALLED TO, READ BACK BY AND VERIFIED WITH: ERTIS RN 02/03/23 @ 0101 BY AB (NOTE) The GeneXpert MRSA Assay (FDA approved for NASAL specimens only), is one component of a comprehensive MRSA colonization surveillance program. It is not intended to diagnose MRSA infection nor to guide or monitor treatment for MRSA infections. Test  performance is not FDA approved in patients less than  77 years old. Performed at [facility], [address] [place], [place] [address]    Labs: CBC: Recent Labs  Lab 01/30/23 [phone] 01/31/23 1032 02/01/23 1012 02/02/23 1657 02/03/23 0506  WBC 8.9 7.9 6.3 6.4 5.3  NEUTROABS  --   --  3.8 4.2 2.8  HGB 8.5* 9.1* 8.6* 9.4* 8.6*  HCT 29.1* 30.5* 30.0* 31.6* 29.2*  MCV 70.1* 71.1* 72.1* 71.7* 72.1*  PLT 212 252 221 234 234   Basic Metabolic Panel: Recent Labs  Lab 01/30/23 0852 01/31/23 1032 02/01/23 1012 02/02/23 0305 02/03/23 0506  NA 133* 135 138 136 135  K 3.7 3.9 3.9 4.7 4.0  CL 103 103 101 105 104  CO2 19* 21* 24 20* 22  GLUCOSE 113* 112* 104* 84 87  BUN <5* 5* <5* <5* 5*  CREATININE 1.08* 0.79 0.76 0.47 0.54  CALCIUM 10.3 10.8* 11.2* 10.6* 10.6*  MG 1.8 1.9 1.8 1.7 1.9  PHOS 1.9* 2.1* 1.9* 3.8 2.7   Liver Function Tests: Recent Labs  Lab 01/30/23 0852 01/31/23 1032 02/01/23 1012 02/02/23 0305 02/03/23 0506  AST  --   --  13* 21 11*  ALT  --   --  12 7 8   ALKPHOS  --   --  52 49 49  BILITOT  --   --  0.3 0.6 0.3  PROT  --   --  7.5 6.6 6.6  ALBUMIN 2.9* 3.0* 3.0* 2.7* 2.7*   CBG: Recent Labs  Lab 02/02/23 1121 02/02/23 1808 02/03/23 0018 02/03/23 0638 02/03/23 1108  GLUCAP 86 94 119* 93 129*   Discharge time spent: greater than 30 minutes.  Signed: Marguerita Merles, DO [facility] 02/03/2023

## 2023-02-03 NOTE — Plan of Care (Signed)
  Problem: Clinical Measurements: Goal: Respiratory complications will improve Outcome: Progressing   Problem: Safety: Goal: Ability to remain free from injury will improve Outcome: Progressing   

## 2023-02-03 NOTE — NC FL2 (Signed)
[place] MEDICAID FL2 LEVEL OF CARE FORM     IDENTIFICATION  Patient Name: Denise Savage Birthdate: [birth year] Sex: female Admission Date (Current Location): 01/26/2023  [facility] and [place] Number:  [employer] and Address:  [facility], 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   Seizures (HCC)    Pulmonary embolus (HCC)    Dysphagia    Prolonged QT interval    Thickened endometrium    ICH (intracerebral hemorrhage) (HCC) 06/18/2020   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   SAH (subarachnoid hemorrhage) (HCC) 06/15/2020    Orientation RESPIRATION BLADDER Height & Weight      (disoriented)  Normal Incontinent Weight: 177 lb (80.3 kg) Height:  5\' 7"  (170.2 cm)  BEHAVIORAL SYMPTOMS/MOOD NEUROLOGICAL BOWEL NUTRITION STATUS    Convulsions/Seizures Incontinent Feeding tube  AMBULATORY STATUS COMMUNICATION OF NEEDS Skin   Extensive Assist Non-Verbally Normal                       Personal Care Assistance Level of Assistance  Bathing, Feeding, Dressing Bathing Assistance: Maximum assistance Feeding assistance: Maximum assistance Dressing Assistance: Maximum assistance     Functional Limitations Info  Speech     Speech Info: Impaired    SPECIAL CARE FACTORS FREQUENCY                       Contractures Contractures Info: Present    Additional Factors Info  Code Status, Allergies  Code Status Info: Full Allergies Info: Hydrocodone           Current Medications (02/03/2023):  This is the current hospital active medication list Current Facility-Administered Medications  Medication Dose Route Frequency Provider Last Rate Last Admin   acetaminophen (TYLENOL) tablet 650 mg  650 mg Per Tube Q6H PRN Elgergawy, Leana Roe, MD   650 mg at 02/02/23 2240   Or   acetaminophen (TYLENOL) suppository 650 mg  650 mg Rectal Q6H PRN Elgergawy, Leana Roe, MD       Chlorhexidine Gluconate Cloth 2 % PADS 6 each  6 each Topical Daily Marguerita Merles [place], DO   6 each at 02/03/23 1011   feeding supplement (JEVITY 1.5 CAL/FIBER) liquid 237 mL  237 mL Per Tube 5 X Daily Gonfa, Taye T, MD   237 mL at 02/03/23 1450   fiber supplement (BANATROL TF) liquid 60 mL  60 mL Per Tube TID Marguerita Merles [place], DO   60 mL at 02/03/23 1451   free water 120 mL  120 mL Per Tube 5 X Daily Gonfa, Taye T, MD   120 mL at 02/03/23 1451   free water 240 mL  240 mL Per Tube BID Marguerita Merles [place], DO   240 mL at 02/03/23 1007   glycopyrrolate (ROBINUL) injection 0.1 mg  0.1 mg Intravenous Q8H PRN Marguerita Merles [place], DO  glycopyrrolate (ROBINUL) tablet 1 mg  1 mg Oral Q8H PRN Elgergawy, Leana Roe, MD   1 mg at 02/02/23 2240   heparin injection 5,000 Units  5,000 Units Subcutaneous Q8H Elgergawy, Leana Roe, MD   5,000 Units at 02/03/23 1424   hydrOXYzine (ATARAX) 10 MG/5ML syrup 25 mg  25 mg Per Tube Q6H PRN Janalyn Shy, Subrina, MD   25 mg at 01/30/23 0903   labetalol (NORMODYNE) injection 10 mg  10 mg Intravenous Q2H PRN Candelaria Stagers T, MD   10 mg at 02/02/23 2238   lacosamide (VIMPAT) tablet 100 mg  100 mg Per Tube BID Marguerita Merles [place], DO   100 mg at 02/03/23 1004   metoprolol tartrate (LOPRESSOR) injection 2.5 mg  2.5 mg Intravenous Q4H PRN Candelaria Stagers T, MD   2.5 mg at 01/28/23 1853   mupirocin ointment (BACTROBAN) 2 % 1 Application  1 Application Nasal BID Marguerita Merles [place], DO   1 Application at 02/03/23 1012    Oral care mouth rinse  15 mL Mouth Rinse 4 times per day Candelaria Stagers T, MD   15 mL at 02/03/23 1201   Oral care mouth rinse  15 mL Mouth Rinse PRN Gonfa, Taye T, MD       pantoprazole (PROTONIX) injection 40 mg  40 mg Intravenous Q24H Gonfa, Taye T, MD   40 mg at 02/03/23 1201   QUEtiapine (SEROQUEL) tablet 25 mg  25 mg Per Tube BID PRN Candelaria Stagers T, MD   25 mg at 02/01/23 1357   valproic acid (DEPAKENE) 250 MG/5ML solution 750 mg  750 mg Per Tube TID Marguerita Merles Latif, DO   750 mg at 02/03/23 1451     Discharge Medications: Please see discharge summary for a list of discharge medications.  Relevant Imaging Results:  Relevant Lab Results:   Additional Information SS#: [id number]  Baldemar Lenis, LCSW

## 2023-02-03 NOTE — TOC Transition Note (Signed)
Transition of Care [facility]) - CM/SW Discharge Note   Patient Details  Name: Denise Savage MRN: [id number] Date of Birth: Sep 07, 1960  [facility]) CM/SW Contact:  Baldemar Lenis, LCSW Phone Number: 02/03/2023, 3:39 PM   Clinical Narrative:   CSW updated by MD that patient now medically stable to return to SNF. CSW updated [facility], they are able to accept back today. CSW notified daughter, [name], she is aware and in agreement. CSW sent discharge summary. Transport arranged with [facility] for next available.  Nurse to call report to [phone] Room B6 Bed 1    Final next level of care: Skilled Nursing Facility Barriers to Discharge: Barriers Resolved   Patient Goals and CMS Choice CMS Medicare.gov Compare Post Acute Care list provided to:: Patient Represenative (must comment) Choice offered to / list presented to : Adult Children  Discharge Placement                Patient chooses bed at:  [facility]) Patient to be transferred to facility by: [facility] Name of family member notified: [name] Patient and family notified of of transfer: 02/03/23  Discharge Plan and Services Additional resources added to the After Visit Summary for       Post Acute Care Choice: Skilled Nursing Facility                               Social Determinants of Health (SDOH) Interventions SDOH Screenings   Food Insecurity: No Food Insecurity (01/28/2023)  Housing: Patient Declined (01/28/2023)  Transportation Needs: No Transportation Needs (01/28/2023)  Utilities: Not At Risk (01/28/2023)  Tobacco Use: Low Risk  (01/26/2023)     Readmission Risk Interventions    09/08/2020    8:04 AM  Readmission Risk Prevention Plan  Transportation Screening Complete  PCP or Specialist Appt within 5-7 Days Complete  Home Care Screening Complete  Medication Review (RN CM) Complete

## 2023-02-06 LAB — CULTURE, BLOOD (ROUTINE X 2)
Culture: NO GROWTH
Culture: NO GROWTH
Special Requests: ADEQUATE
Special Requests: ADEQUATE

## 2023-02-10 ENCOUNTER — Encounter: Payer: Self-pay | Admitting: Neurology

## 2023-03-23 ENCOUNTER — Encounter: Payer: Self-pay | Admitting: Neurology

## 2023-03-23 ENCOUNTER — Ambulatory Visit (INDEPENDENT_AMBULATORY_CARE_PROVIDER_SITE_OTHER): Payer: Medicare Other | Admitting: Neurology

## 2023-03-23 VITALS — BP 100/69 | HR 66 | Ht 63.0 in | Wt 200.0 lb

## 2023-03-23 DIAGNOSIS — R252 Cramp and spasm: Secondary | ICD-10-CM

## 2023-03-23 DIAGNOSIS — G40209 Localization-related (focal) (partial) symptomatic epilepsy and epileptic syndromes with complex partial seizures, not intractable, without status epilepticus: Secondary | ICD-10-CM | POA: Diagnosis not present

## 2023-03-23 DIAGNOSIS — Z8679 Personal history of other diseases of the circulatory system: Secondary | ICD-10-CM

## 2023-03-23 DIAGNOSIS — I69398 Other sequelae of cerebral infarction: Secondary | ICD-10-CM

## 2023-03-23 MED ORDER — BACLOFEN 5 MG/5ML PO SOLN: ORAL | 5 refills | Status: DC

## 2023-03-23 NOTE — Patient Instructions (Signed)
Good to meet you.  Start Baclofen 5mL (5mg ) every night to help with spasticity  2. Continue all other medications  3. Follow-up in 6 months, call for any changes

## 2023-03-23 NOTE — Progress Notes (Signed)
NEUROLOGY CONSULTATION NOTE  Denise Savage MRN: [id number] DOB: Oct 18, 1960  Referring provider: Dr. Lilyan Punt Primary care provider: Dr. Lilyan Punt  Reason for consult:  seizure  Dear Dr Gerda Diss:  Thank you for your kind referral of Denise Savage for consultation of the above symptoms. Although her history is well known to you, please allow me to reiterate it for the purpose of our medical record. The patient was accompanied to the clinic by her daughter Denise Savage and son Denise Savage who also provide collateral information. Records and images were personally reviewed where available.   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 Her children are present to provide history as she is aphasic. In 06/2020, she was having left upper extremity rhythmic twitching, however periodic epileptiform discharges were seen in the left frontal region. It was felt that due to motor weakness on the right, possibly seizures are not clinically visible versus spread of seizures from left to right motor cortex. Denise Savage reports that after hospitalization in 06/2020, there were no further seizures until 01/26/23 when she was found unresponsive at [facility] with left-sided tremulousness. She was evaluated by Neurology with note of agitation, tremulous activity of left arm and leg, able to lift arm during this. She also had a gaze preference that would come towards midline. She was transferred for vEEG monitoring, 48-hour EEG did not show any epileptiform activity. There was diffuse theta-delta slowing and sharply contoured theta slowing in the left frontal region consistent with  breach artifact. There was an episode of right UE tremor like activity where she was crying followed by extension of arm with no EEG changes. Keppra was stopped due to agitation, Depakote increased to 750mg  TID, she is also on Lacosamide 100mg  BID. EEG was discontinued, then on 8/19, she had a witnessed seizure, she was yelling out (baseline) then suddenly became unresponsive with head turned to the right followed by tonic-clonic seizure lasting 1 minute. She had another 24 hours of EEG which did not show any epileptiform changes, there was diffuse slowing and lateralized left hemisphere slowing. Denise Savage denies any further seizures since hospital discharge. No apparent side effects on medications.  She is drowsy but arousable in the office today, they report she is not a morning person and at baseline is more awake in the afternoon. At baseline, she is aphasic and inconsistently follows commands. She is wheelchair-bound and needs assistance with all ADLs. Denise Savage reports she has good days where she is laughing and joking with family. She has intermittent sobbing spells in the office, mostly when her right side is moved. They report she does this several times a week, mostly when she is frustrated about difficulty communicating. She does not complain of anything to family and sleeps okay. She nods today when asked if the right side is painful.    PAST MEDICAL HISTORY: Past Medical History:  Diagnosis Date   Abdominal hernia    Anxiety    Depression    Dysphagia    G tube feedings (HCC)    G tube feedings (HCC)    Gastrostomy status (HCC)    GERD (gastroesophageal reflux disease)    Hemiplegia and hemiparesis following cerebral infarction  affecting right dominant side (HCC)    Non-verbal learning disorder    Pulmonary embolus (HCC)    Seizure (HCC)    Seizures (HCC)    Stroke [facility])    Wheelchair dependent     PAST SURGICAL HISTORY: Past Surgical History:  Procedure Laterality Date    CRANIOPLASTY Left 03/23/2021   Procedure: CRANIOPLASTY, HARVEST ABDOMINAL BONE FLAP;  Surgeon: Lisbeth Renshaw, MD;  Location: [facility] OR;  Service: Neurosurgery;  Laterality: Left;   CRANIOTOMY Left 06/19/2020   Procedure: CRANIOTOMY HEMATOMA EVACUATION SUBDURAL;  Surgeon: Jadene Pierini, MD;  Location: [facility] OR;  Service: Neurosurgery;  Laterality: Left;   ESOPHAGOGASTRODUODENOSCOPY N/A 07/01/2020   Procedure: ESOPHAGOGASTRODUODENOSCOPY (EGD);  Surgeon: Diamantina Monks, MD;  Location: [facility] ENDOSCOPY;  Service: General;  Laterality: N/A;   GASTROSTOMY  01/27/2022   IR ANGIO INTRA EXTRACRAN SEL INTERNAL CAROTID BILAT MOD SED  06/16/2020   IR ANGIO VERTEBRAL SEL VERTEBRAL UNI R MOD SED  06/16/2020   IR ANGIOGRAM FOLLOW UP STUDY  06/16/2020   IR ANGIOGRAM FOLLOW UP STUDY  06/16/2020   IR ANGIOGRAM FOLLOW UP STUDY  06/16/2020   IR CT HEAD LTD  06/16/2020   IR NEURO EACH ADD'L AFTER BASIC UNI RIGHT (MS)  06/16/2020   IR PATIENT EVAL TECH 0-60 MINS  01/24/2022   IR RADIOLOGIST EVAL & MGMT  01/24/2022   IR REPLC GASTRO/COLONIC TUBE PERCUT W/FLUORO  10/03/2022   IR TRANSCATH/EMBOLIZ  06/16/2020   IR US GUIDE VASC ACCESS RIGHT  06/16/2020   PEG PLACEMENT N/A 07/01/2020   Procedure: PERCUTANEOUS ENDOSCOPIC GASTROSTOMY (PEG) PLACEMENT;  Surgeon: Diamantina Monks, MD;  Location: [facility] ENDOSCOPY;  Service: General;  Laterality: N/A;   RADIOLOGY WITH ANESTHESIA N/A 06/16/2020   Procedure: IR WITH ANESTHESIA;  Surgeon: Lisbeth Renshaw, MD;  Location: [facility] OR;  Service: Radiology;  Laterality: N/A;   TUBAL LIGATION     VENTRAL HERNIA REPAIR N/A 12/29/2014   Procedure: LAPAROSCOPIC VENTRAL HERNIA WITH MESH;  Surgeon: Franky Macho, MD;  Location: [facility] ORS;  Service: General;  Laterality: N/A;    MEDICATIONS: Current Outpatient Medications on File Prior to Visit  Medication Sig Dispense Refill   acetaminophen (TYLENOL) 325 MG tablet Place 650 mg into feeding tube in the morning, at noon, in the evening, and at bedtime. (0000, 0600,  1200, 1800)     Dextromethorphan-quiNIDine (NUEDEXTA) 20-10 MG capsule 1 capsule every 12 (twelve) hours. Via G-Tube (0900 & 2100)     fiber supplement, BANATROL TF, liquid Place 60 mLs into feeding tube 3 (three) times daily.     glycopyrrolate (ROBINUL) 1 MG tablet Take 1 mg by mouth every 8 (eight) hours as needed (increased secretions).     hydrOXYzine (ATARAX) 10 MG/5ML syrup Place 12.5 mLs (25 mg total) into feeding tube every 6 (six) hours as needed for anxiety.     lacosamide 100 MG TABS Place 1 tablet (100 mg total) into feeding tube 2 (two) times daily. 10 tablet 0   LORazepam (ATIVAN) 1 MG tablet Place 1 tablet (1 mg total) into feeding tube in the morning, at noon, in the evening, and at bedtime. (0000, 0600, 1200, 1800) 8 tablet 0   medroxyPROGESTERone (DEPO-PROVERA) 150 MG/ML injection Inject 1 mL (150 mg total) into the muscle every 3 (three) months. 1 mL 4   Melatonin 10 MG TABS Take 10 mg by mouth at bedtime. (2100)     Mouthwashes (MOUTH RINSE) LIQD solution 15 mLs by Mouth Rinse route in the morning, at  noon, in the evening, and at bedtime.     mupirocin ointment (BACTROBAN) 2 % Place 1 Application into the nose 2 (two) times daily.     Nutritional Supplements (ISOSOURCE 1.5 CAL) LIQD Place 237 mLs into feeding tube 5 (five) times daily. (0600, 1000, 1400, 1800 & 2200)     omeprazole (KONVOMEP) 2 mg/mL SUSP oral suspension Place 20 mg into feeding tube daily.     polyethylene glycol (MIRALAX / GLYCOLAX) 17 g packet Take 17 g by mouth 2 (two) times daily.     QUEtiapine (SEROQUEL) 25 MG tablet Place 1 tablet (25 mg total) into feeding tube 2 (two) times daily as needed (Agitation).     traZODone (DESYREL) 50 MG tablet Take 75 mg by mouth at bedtime. (2200)     valproic acid (DEPAKENE) 250 MG/5ML solution Place 15 mLs (750 mg total) into feeding tube 3 (three) times daily.     Water For Irrigation, Sterile (FREE WATER) SOLN Place 30-240 mLs into feeding tube See admin instructions.  Flush feeding tube with 240 mls of water twice daily between feedings, flush with 60 ml of water before and after initiating feedings &  Flush peg tube with 30 cc of water before and after medication administration     No current facility-administered medications on file prior to visit.    ALLERGIES: Allergies  Allergen Reactions   Hydrocodone Nausea Only    FAMILY HISTORY: Family History  Problem Relation Age of Onset   Pancreatitis Neg Hx     SOCIAL HISTORY: Social History   Socioeconomic History   Marital status: Single    Spouse name: Not on file   Number of children: Not on file   Years of education: Not on file   Highest education level: Not on file  Occupational History   Not on file  Tobacco Use   Smoking status: Never   Smokeless tobacco: Never  Vaping Use   Vaping status: Never Used  Substance and Sexual Activity   Alcohol use: No   Drug use: No   Sexual activity: Not Currently    Comment: tubal ligation  Other Topics Concern   Not on file  Social History Narrative   [employer] [facility]    Social Determinants of Health   Financial Resource Strain: Not on file  Food Insecurity: No Food Insecurity (01/28/2023)   Hunger Vital Sign    Worried About Running Out of Food in the Last Year: Never true    Ran Out of Food in the Last Year: Never true  Transportation Needs: No Transportation Needs (01/28/2023)   PRAPARE - Administrator, Civil Service (Medical): No    Lack of Transportation (Non-Medical): No  Physical Activity: Not on file  Stress: Not on file  Social Connections: Not on file  Intimate Partner Violence: Not At Risk (01/28/2023)   Humiliation, Afraid, Rape, and Kick questionnaire    Fear of Current or Ex-Partner: No    Emotionally Abused: No    Physically Abused: No    Sexually Abused: No     PHYSICAL EXAM: Vitals:   03/23/23 0908  BP: 100/69  Pulse: 66  SpO2: 99%   General: No acute distress, reclined on  wheelchair Skin/Extremities: No rash, no edema Neurological Exam: Mental status: drowsy but arousable to voice. Inconsistently follows commands. Patient gets very agitated when right side is approached or touched, she nods when asked about pain on the right.  Cranial nerves: CN I: not tested  CN II: pupils equal, round CN III, IV, VI:  full range of motion, no nystagmus CN VII: shallow right nasolabial fold CN VIII: hearing intact to conversation Bulk & Tone: increased tone throughout, R>L Motor: unable to do formal muscle testing, at least 4/5 on left UE and LE, gets agitated with any discussion or movement on right.  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 She was found unresponsive with some left arm/leg tremulousness. During her hospitalization, she would get very agitated when staff approaches her, she gets very emotional/crying today when we discuss touching her right arm, there appears to be a lot of pain possibly from spasticity. She had a witnessed episode of unresponsiveness followed by tonic-clonic activity in the hospital on 8/19. No further seizures since then, she is on Lacosamide 100mg  BID and Depakene 750mg  TID without apparent side effects. We discussed starting low dose Baclofen 5mg  at bedtime to help with spasticity/pain, monitor for sedation. Botox in the future may help however with her significant pain response, this would likely be difficult for patient at this time. Continue 24/7 care. Follow-up in 6 months, call for any changes.    Thank you for allowing me to  participate in the care of this patient. Please do not hesitate to call for any questions or concerns.   Patrcia Dolly, M.D.  CC: Dr. Gerda Diss

## 2023-03-26 ENCOUNTER — Encounter (HOSPITAL_COMMUNITY): Payer: Self-pay

## 2023-03-26 ENCOUNTER — Inpatient Hospital Stay (HOSPITAL_COMMUNITY)
Admission: EM | Admit: 2023-03-26 | Discharge: 2023-03-29 | DRG: 689 | Disposition: A | Discharge status: Skilled Nursing Facility | Payer: Medicare Other | Source: Skilled Nursing Facility | Attending: Internal Medicine | Admitting: Internal Medicine

## 2023-03-26 ENCOUNTER — Emergency Department (HOSPITAL_COMMUNITY): Payer: Medicare Other

## 2023-03-26 ENCOUNTER — Other Ambulatory Visit: Payer: Self-pay

## 2023-03-26 DIAGNOSIS — Z87440 Personal history of urinary (tract) infections: Secondary | ICD-10-CM

## 2023-03-26 DIAGNOSIS — B9620 Unspecified Escherichia coli [E. coli] as the cause of diseases classified elsewhere: Secondary | ICD-10-CM | POA: Diagnosis present

## 2023-03-26 DIAGNOSIS — Z931 Gastrostomy status: Secondary | ICD-10-CM

## 2023-03-26 DIAGNOSIS — K219 Gastro-esophageal reflux disease without esophagitis: Secondary | ICD-10-CM | POA: Diagnosis present

## 2023-03-26 DIAGNOSIS — N390 Urinary tract infection, site not specified: Secondary | ICD-10-CM | POA: Diagnosis not present

## 2023-03-26 DIAGNOSIS — Z1152 Encounter for screening for COVID-19: Secondary | ICD-10-CM

## 2023-03-26 DIAGNOSIS — K859 Acute pancreatitis without necrosis or infection, unspecified: Secondary | ICD-10-CM | POA: Diagnosis present

## 2023-03-26 DIAGNOSIS — Z6835 Body mass index (BMI) 35.0-35.9, adult: Secondary | ICD-10-CM

## 2023-03-26 DIAGNOSIS — E66812 Obesity, class 2: Secondary | ICD-10-CM | POA: Diagnosis present

## 2023-03-26 DIAGNOSIS — Z86711 Personal history of pulmonary embolism: Secondary | ICD-10-CM

## 2023-03-26 DIAGNOSIS — Z885 Allergy status to narcotic agent status: Secondary | ICD-10-CM

## 2023-03-26 DIAGNOSIS — Z1612 Extended spectrum beta lactamase (ESBL) resistance: Secondary | ICD-10-CM | POA: Diagnosis present

## 2023-03-26 DIAGNOSIS — Z880 Allergy status to penicillin: Secondary | ICD-10-CM

## 2023-03-26 DIAGNOSIS — Z515 Encounter for palliative care: Secondary | ICD-10-CM

## 2023-03-26 DIAGNOSIS — Z993 Dependence on wheelchair: Secondary | ICD-10-CM

## 2023-03-26 DIAGNOSIS — Z79899 Other long term (current) drug therapy: Secondary | ICD-10-CM

## 2023-03-26 DIAGNOSIS — D61818 Other pancytopenia: Secondary | ICD-10-CM | POA: Diagnosis present

## 2023-03-26 DIAGNOSIS — F419 Anxiety disorder, unspecified: Secondary | ICD-10-CM | POA: Diagnosis present

## 2023-03-26 DIAGNOSIS — G9340 Encephalopathy, unspecified: Secondary | ICD-10-CM | POA: Diagnosis present

## 2023-03-26 DIAGNOSIS — D72819 Decreased white blood cell count, unspecified: Secondary | ICD-10-CM | POA: Diagnosis present

## 2023-03-26 DIAGNOSIS — I69328 Other speech and language deficits following cerebral infarction: Secondary | ICD-10-CM

## 2023-03-26 DIAGNOSIS — Z9851 Tubal ligation status: Secondary | ICD-10-CM

## 2023-03-26 DIAGNOSIS — R4701 Aphasia: Secondary | ICD-10-CM | POA: Diagnosis present

## 2023-03-26 DIAGNOSIS — F32A Depression, unspecified: Secondary | ICD-10-CM | POA: Diagnosis present

## 2023-03-26 DIAGNOSIS — G40909 Epilepsy, unspecified, not intractable, without status epilepticus: Secondary | ICD-10-CM | POA: Diagnosis present

## 2023-03-26 DIAGNOSIS — I69391 Dysphagia following cerebral infarction: Secondary | ICD-10-CM

## 2023-03-26 MED ORDER — VALPROIC ACID 250 MG/5ML PO SOLN
750.0000 mg | Freq: Three times a day (TID) | ORAL | Status: DC
  Administered 2023-03-27 – 2023-03-29 (×9): 750 mg
  Filled 2023-03-26 (×14): qty 15

## 2023-03-26 MED ORDER — LACOSAMIDE 10 MG/ML PO SOLN
100.0000 mg | Freq: Two times a day (BID) | ORAL | Status: DC
  Administered 2023-03-27 – 2023-03-29 (×5): 100 mg
  Filled 2023-03-26 (×5): qty 10

## 2023-03-26 MED ORDER — LACTATED RINGERS IV BOLUS (SEPSIS)
500.0000 mL | Freq: Once | INTRAVENOUS | Status: AC
  Administered 2023-03-27: 500 mL via INTRAVENOUS

## 2023-03-26 NOTE — ED Triage Notes (Signed)
Pt arrived via [facility] from [facility], who called EMS to report Pt had a low-grade fever of 100.69F, and Pt has not been as "loud as she normally is." The facility staff called to report the Pt has been more altered than usual and has not been making as many sounds as she normally does. Pt has Hx of Stroke, UTI, Dementia, Seizures, Dysphagia, and has 18g G-Tube in place on arrival.

## 2023-03-26 NOTE — ED Provider Notes (Signed)
[place] EMERGENCY DEPARTMENT AT [facility] Provider Note   CSN: [id number] Arrival date & time: 03/26/23  2227     History {Add pertinent medical, surgical, social history, OB history to HPI:1} Chief Complaint  Patient presents with   Fever    Denise Savage is a 62 y.o. female.   Fever Patient presents for low-grade fever.  Medical history includes anxiety, CVA, seizures, GERD, anxiety, depression, SAH, PE.  She arrives via EMS from skilled nursing facility.  Nursing facility staff noted a fever today of 100.3 degrees.  They noted decreased activity and altered mental status.  Per chart review, she is nonverbal at baseline.     Home Medications Prior to Admission medications   Medication Sig Start Date End Date Taking? Authorizing Provider  acetaminophen (TYLENOL) 325 MG tablet Place 650 mg into feeding tube in the morning, at noon, in the evening, and at bedtime. (0000, 0600, 1200, 1800)    [provider]  baclofen (OZOBAX) 5 MG/5ML SOLN 5mL (5mg ) per tube every night 03/23/23   Van Clines, MD  Dextromethorphan-quiNIDine (NUEDEXTA) 20-10 MG capsule 1 capsule every 12 (twelve) hours. Via G-Tube (0900 & 2100)    [provider]  fiber supplement, BANATROL TF, liquid Place 60 mLs into feeding tube 3 (three) times daily. 02/03/23   Marguerita Merles Latif, DO  glycopyrrolate (ROBINUL) 1 MG tablet Take 1 mg by mouth every 8 (eight) hours as needed (increased secretions).    [provider]  hydrOXYzine (ATARAX) 10 MG/5ML syrup Place 12.5 mLs (25 mg total) into feeding tube every 6 (six) hours as needed for anxiety. 02/03/23   Marguerita Merles Latif, DO  lacosamide (VIMPAT) 10 MG/ML oral solution Take by mouth 2 (two) times daily. 03/13/23   [provider]  LORazepam (ATIVAN) 1 MG tablet Place 1 tablet (1 mg total) into feeding tube in the morning, at noon, in the evening, and at bedtime. (0000, 0600, 1200, 1800) 02/03/23   Sheikh, Kateri Mc  Latif, DO  medroxyPROGESTERone (DEPO-PROVERA) 150 MG/ML injection Inject 1 mL (150 mg total) into the muscle every 3 (three) months. 07/29/22 03/23/23  Myna Hidalgo, DO  Melatonin 10 MG TABS Take 10 mg by mouth at bedtime. (2100)    [provider]  Mouthwashes (MOUTH RINSE) LIQD solution 15 mLs by Mouth Rinse route in the morning, at noon, in the evening, and at bedtime. 02/03/23   Marguerita Merles Latif, DO  mupirocin ointment (BACTROBAN) 2 % Place 1 Application into the nose 2 (two) times daily. 02/03/23   Marguerita Merles Latif, DO  Nutritional Supplements (ISOSOURCE 1.5 CAL) LIQD Place 237 mLs into feeding tube 5 (five) times daily. (0600, 1000, 1400, 1800 & 2200)    [provider]  omeprazole (KONVOMEP) 2 mg/mL SUSP oral suspension Place 20 mg into feeding tube daily.    [provider]  polyethylene glycol (MIRALAX / GLYCOLAX) 17 g packet Take 17 g by mouth 2 (two) times daily.    [provider]  traZODone (DESYREL) 50 MG tablet Take 75 mg by mouth at bedtime. (2200) 02/21/21   [provider]  valproic acid (DEPAKENE) 250 MG/5ML solution Place 15 mLs (750 mg total) into feeding tube 3 (three) times daily. 02/03/23   Marguerita Merles Latif, DO  Water For Irrigation, Sterile (FREE WATER) SOLN Place 30-240 mLs into feeding tube See admin instructions. Flush feeding tube with 240 mls of water twice daily between feedings, flush with 60 ml of water before  and after initiating feedings &  Flush peg tube with 30 cc of water before and after medication administration    [provider]      Allergies    Hydrocodone and Penicillins    Review of Systems   Review of Systems  Unable to perform ROS: Patient nonverbal  Constitutional:  Positive for fever.    Physical Exam Updated Vital Signs BP (!) 126/97 (BP Location: Left Arm)   Pulse 70   Temp 98.8 F (37.1 C) (Axillary)   Resp 18   Ht 5\' 3"  (1.6 m)   Wt 90.7 kg   SpO2 97%   BMI 35.42 kg/m   Physical Exam Vitals and nursing note reviewed.  Constitutional:      General: She is not in acute distress.    Appearance: She is well-developed. She is ill-appearing (Chronically). She is not toxic-appearing or diaphoretic.  HENT:     Head: Normocephalic and atraumatic.     Right Ear: External ear normal.     Left Ear: External ear normal.     Nose: Nose normal.     Mouth/Throat:     Mouth: Mucous membranes are moist.  Eyes:     Extraocular Movements: Extraocular movements intact.     Conjunctiva/sclera: Conjunctivae normal.  Cardiovascular:     Rate and Rhythm: Normal rate and regular rhythm.     Heart sounds: No murmur heard. Pulmonary:     Effort: Pulmonary effort is normal. No respiratory distress.     Breath sounds: Normal breath sounds. No wheezing or rales.  Chest:     Chest wall: No tenderness.  Abdominal:     Palpations: Abdomen is soft.     Tenderness: There is abdominal tenderness. There is no guarding or rebound.  Musculoskeletal:        General: No swelling or deformity.     Cervical back: Neck supple.     Right lower leg: No edema.     Left lower leg: No edema.  Skin:    General: Skin is warm and dry.     Coloration: Skin is not jaundiced or pale.  Neurological:     Mental Status: She is alert. Mental status is at baseline.  Psychiatric:        Mood and Affect: Mood normal.        Behavior: Behavior normal.     ED Results / Procedures / Treatments   Labs (all labs ordered are listed, but only abnormal results are displayed) Labs Reviewed - No data to display  EKG None  Radiology No results found.  Procedures Procedures  {Document cardiac monitor, telemetry assessment procedure when appropriate:1}  Medications Ordered in ED Medications - No data to display  ED Course/ Medical Decision Making/ A&P   {   Click here for ABCD2, HEART and other calculatorsREFRESH Note before signing :1}                              Medical Decision  Making Amount and/or Complexity of Data Reviewed Labs: ordered. Radiology: ordered. ECG/medicine tests: ordered.   This patient presents to the ED for concern of ***, this involves an extensive number of treatment options, and is a complaint that carries with it a high risk of complications and morbidity.  The differential diagnosis includes ***   Co morbidities that complicate the patient evaluation  ***   Additional history obtained:  Additional history obtained from ***  External records from outside source obtained and reviewed including ***   Lab Tests:  I Ordered, and personally interpreted labs.  The pertinent results include:  ***   Imaging Studies ordered:  I ordered imaging studies including ***  I independently visualized and interpreted imaging which showed *** I agree with the radiologist interpretation   Cardiac Monitoring: / EKG:  The patient was maintained on a cardiac monitor.  I personally viewed and interpreted the cardiac monitored which showed an underlying rhythm of: ***   Consultations Obtained:  I requested consultation with the ***,  and discussed lab and imaging findings as well as pertinent plan - they recommend: ***   Problem List / ED Course / Critical interventions / Medication management  Patient presenting for low-grade fever and reported decreased activity at her skilled nursing facility.  On arrival, she is afebrile.  On assessment, she is awake and resting in bed.  Quick review, she is nonverbal at baseline.  She does follow commands at this time.  Her breathing is unlabored.  She does seem to have a generalized abdominal tenderness.  Workup was initiated.  IV fluids were ordered for hydration.  She does have a history of seizures.  AEDs were ordered.***. I ordered medication including ***  for ***  Reevaluation of the patient after these medicines showed that the patient {resolved/improved/worsened:23923::"improved"} I have reviewed  the patients home medicines and have made adjustments as needed   Social Determinants of Health:  ***   Test / Admission - Considered:  ***   {Document critical care time when appropriate:1} {Document review of labs and clinical decision tools ie heart score, Chads2Vasc2 etc:1}  {Document your independent review of radiology images, and any outside records:1} {Document your discussion with family members, caretakers, and with consultants:1} {Document social determinants of health affecting pt's care:1} {Document your decision making why or why not admission, treatments were needed:1} Final Clinical Impression(s) / ED Diagnoses Final diagnoses:  None    Rx / DC Orders ED Discharge Orders     None

## 2023-03-27 ENCOUNTER — Emergency Department (HOSPITAL_COMMUNITY): Payer: Medicare Other

## 2023-03-27 ENCOUNTER — Encounter (HOSPITAL_COMMUNITY): Payer: Self-pay | Admitting: Family Medicine

## 2023-03-27 DIAGNOSIS — Z1152 Encounter for screening for COVID-19: Secondary | ICD-10-CM | POA: Diagnosis not present

## 2023-03-27 DIAGNOSIS — D61818 Other pancytopenia: Secondary | ICD-10-CM | POA: Diagnosis present

## 2023-03-27 DIAGNOSIS — G9340 Encephalopathy, unspecified: Secondary | ICD-10-CM | POA: Diagnosis present

## 2023-03-27 DIAGNOSIS — E66812 Obesity, class 2: Secondary | ICD-10-CM | POA: Diagnosis present

## 2023-03-27 DIAGNOSIS — Z1612 Extended spectrum beta lactamase (ESBL) resistance: Secondary | ICD-10-CM | POA: Diagnosis present

## 2023-03-27 DIAGNOSIS — Z515 Encounter for palliative care: Secondary | ICD-10-CM

## 2023-03-27 DIAGNOSIS — G40909 Epilepsy, unspecified, not intractable, without status epilepticus: Secondary | ICD-10-CM | POA: Diagnosis present

## 2023-03-27 DIAGNOSIS — R569 Unspecified convulsions: Secondary | ICD-10-CM | POA: Diagnosis not present

## 2023-03-27 DIAGNOSIS — K859 Acute pancreatitis without necrosis or infection, unspecified: Secondary | ICD-10-CM | POA: Diagnosis present

## 2023-03-27 DIAGNOSIS — Z87440 Personal history of urinary (tract) infections: Secondary | ICD-10-CM | POA: Diagnosis not present

## 2023-03-27 DIAGNOSIS — B9620 Unspecified Escherichia coli [E. coli] as the cause of diseases classified elsewhere: Secondary | ICD-10-CM | POA: Diagnosis present

## 2023-03-27 DIAGNOSIS — D72819 Decreased white blood cell count, unspecified: Secondary | ICD-10-CM | POA: Diagnosis present

## 2023-03-27 DIAGNOSIS — Z7189 Other specified counseling: Secondary | ICD-10-CM | POA: Diagnosis not present

## 2023-03-27 DIAGNOSIS — I6389 Other cerebral infarction: Secondary | ICD-10-CM | POA: Diagnosis not present

## 2023-03-27 DIAGNOSIS — N390 Urinary tract infection, site not specified: Secondary | ICD-10-CM

## 2023-03-27 DIAGNOSIS — I69391 Dysphagia following cerebral infarction: Secondary | ICD-10-CM | POA: Diagnosis not present

## 2023-03-27 DIAGNOSIS — Z931 Gastrostomy status: Secondary | ICD-10-CM | POA: Diagnosis not present

## 2023-03-27 DIAGNOSIS — Z993 Dependence on wheelchair: Secondary | ICD-10-CM | POA: Diagnosis not present

## 2023-03-27 DIAGNOSIS — Z885 Allergy status to narcotic agent status: Secondary | ICD-10-CM | POA: Diagnosis not present

## 2023-03-27 DIAGNOSIS — K219 Gastro-esophageal reflux disease without esophagitis: Secondary | ICD-10-CM | POA: Diagnosis present

## 2023-03-27 DIAGNOSIS — I69328 Other speech and language deficits following cerebral infarction: Secondary | ICD-10-CM | POA: Diagnosis not present

## 2023-03-27 DIAGNOSIS — Z86711 Personal history of pulmonary embolism: Secondary | ICD-10-CM | POA: Diagnosis not present

## 2023-03-27 DIAGNOSIS — F32A Depression, unspecified: Secondary | ICD-10-CM | POA: Diagnosis present

## 2023-03-27 DIAGNOSIS — Z79899 Other long term (current) drug therapy: Secondary | ICD-10-CM | POA: Diagnosis not present

## 2023-03-27 DIAGNOSIS — Z6835 Body mass index (BMI) 35.0-35.9, adult: Secondary | ICD-10-CM | POA: Diagnosis not present

## 2023-03-27 DIAGNOSIS — F419 Anxiety disorder, unspecified: Secondary | ICD-10-CM | POA: Diagnosis present

## 2023-03-27 DIAGNOSIS — R4701 Aphasia: Secondary | ICD-10-CM | POA: Diagnosis present

## 2023-03-27 LAB — PROTIME-INR
INR: 1.1 (ref 0.8–1.2)
Prothrombin Time: 14.1 s (ref 11.4–15.2)

## 2023-03-27 LAB — BLOOD GAS, ARTERIAL
Acid-Base Excess: 3.8 mmol/L — ABNORMAL HIGH (ref 0.0–2.0)
Bicarbonate: 29.2 mmol/L — ABNORMAL HIGH (ref 20.0–28.0)
Drawn by: 442
O2 Saturation: 97.6 %
Patient temperature: 37
pCO2 arterial: 46 mm[Hg] (ref 32–48)
pH, Arterial: 7.41 (ref 7.35–7.45)
pO2, Arterial: 79 mm[Hg] — ABNORMAL LOW (ref 83–108)

## 2023-03-27 LAB — TROPONIN I (HIGH SENSITIVITY)
Troponin I (High Sensitivity): 3 ng/L (ref <18)
Troponin I (High Sensitivity): 3 ng/L (ref <18)

## 2023-03-27 LAB — CBC WITH DIFFERENTIAL/PLATELET
Abs Immature Granulocytes: 0.01 10*3/uL (ref 0.00–0.07)
Basophils Absolute: 0 10*3/uL (ref 0.0–0.1)
Basophils Relative: 1 %
Eosinophils Absolute: 0.5 10*3/uL (ref 0.0–0.5)
Eosinophils Relative: 13 %
HCT: 34.6 % — ABNORMAL LOW (ref 36.0–46.0)
Hemoglobin: 10.5 g/dL — ABNORMAL LOW (ref 12.0–15.0)
Immature Granulocytes: 0 %
Lymphocytes Relative: 47 %
Lymphs Abs: 1.7 10*3/uL (ref 0.7–4.0)
MCH: 26.4 pg (ref 26.0–34.0)
MCHC: 30.3 g/dL (ref 30.0–36.0)
MCV: 86.9 fL (ref 80.0–100.0)
Monocytes Absolute: 0.5 10*3/uL (ref 0.1–1.0)
Monocytes Relative: 14 %
Neutro Abs: 0.9 10*3/uL — ABNORMAL LOW (ref 1.7–7.7)
Neutrophils Relative %: 25 %
Platelets: 139 10*3/uL — ABNORMAL LOW (ref 150–400)
RBC: 3.98 MIL/uL (ref 3.87–5.11)
RDW: 22.9 % — ABNORMAL HIGH (ref 11.5–15.5)
WBC: 3.6 10*3/uL — ABNORMAL LOW (ref 4.0–10.5)
nRBC: 0 % (ref 0.0–0.2)

## 2023-03-27 LAB — URINALYSIS, ROUTINE W REFLEX MICROSCOPIC
Bilirubin Urine: NEGATIVE
Glucose, UA: NEGATIVE mg/dL
Hgb urine dipstick: NEGATIVE
Ketones, ur: 5 mg/dL — AB
Nitrite: NEGATIVE
Protein, ur: 30 mg/dL — AB
Specific Gravity, Urine: 1.024 (ref 1.005–1.030)
pH: 6 (ref 5.0–8.0)

## 2023-03-27 LAB — HEPATIC FUNCTION PANEL
ALT: 12 U/L (ref 0–44)
AST: 18 U/L (ref 15–41)
Albumin: 2.6 g/dL — ABNORMAL LOW (ref 3.5–5.0)
Alkaline Phosphatase: 51 U/L (ref 38–126)
Bilirubin, Direct: <0.1 mg/dL (ref 0.0–0.2)
Total Bilirubin: 0.4 mg/dL (ref 0.3–1.2)
Total Protein: 6.5 g/dL (ref 6.5–8.1)

## 2023-03-27 LAB — AMMONIA: Ammonia: 24 umol/L (ref 9–35)

## 2023-03-27 LAB — BASIC METABOLIC PANEL
Anion gap: 7 (ref 5–15)
BUN: 17 mg/dL (ref 8–23)
CO2: 25 mmol/L (ref 22–32)
Calcium: 10 mg/dL (ref 8.9–10.3)
Chloride: 104 mmol/L (ref 98–111)
Creatinine, Ser: 0.61 mg/dL (ref 0.44–1.00)
GFR, Estimated: >60 mL/min (ref >60)
Glucose, Bld: 110 mg/dL — ABNORMAL HIGH (ref 70–99)
Potassium: 3.6 mmol/L (ref 3.5–5.1)
Sodium: 136 mmol/L (ref 135–145)

## 2023-03-27 LAB — RESP PANEL BY RT-PCR (RSV, FLU A&B, COVID)  RVPGX2
Influenza A by PCR: NEGATIVE
Influenza B by PCR: NEGATIVE
Resp Syncytial Virus by PCR: NEGATIVE
SARS Coronavirus 2 by RT PCR: NEGATIVE

## 2023-03-27 LAB — LACTIC ACID, PLASMA
Lactic Acid, Venous: 1.6 mmol/L (ref 0.5–1.9)
Lactic Acid, Venous: 1.3 mmol/L (ref 0.5–1.9)

## 2023-03-27 LAB — LIPASE, BLOOD: Lipase: 86 U/L — ABNORMAL HIGH (ref 11–51)

## 2023-03-27 LAB — MAGNESIUM: Magnesium: 1.8 mg/dL (ref 1.7–2.4)

## 2023-03-27 LAB — MRSA NEXT GEN BY PCR, NASAL: MRSA by PCR Next Gen: NOT DETECTED

## 2023-03-27 MED ORDER — ACETAMINOPHEN 325 MG PO TABS: 650.0000 mg | ORAL_TABLET | Freq: Four times a day (QID) | ORAL | Status: DC | PRN

## 2023-03-27 MED ORDER — PIPERACILLIN-TAZOBACTAM 3.375 G IVPB
3.3750 g | Freq: Three times a day (TID) | INTRAVENOUS | Status: DC
  Administered 2023-03-27 – 2023-03-29 (×8): 3.375 g via INTRAVENOUS
  Filled 2023-03-27 (×8): qty 50

## 2023-03-27 MED ORDER — PIPERACILLIN-TAZOBACTAM 3.375 G IVPB 30 MIN
3.3750 g | Freq: Once | INTRAVENOUS | Status: AC
  Administered 2023-03-27: 3.375 g via INTRAVENOUS
  Filled 2023-03-27: qty 50

## 2023-03-27 MED ORDER — PROCHLORPERAZINE EDISYLATE 10 MG/2ML IJ SOLN: 10.0000 mg | Freq: Four times a day (QID) | INTRAMUSCULAR | Status: DC | PRN

## 2023-03-27 MED ORDER — LACTATED RINGERS IV SOLN: INTRAVENOUS | Status: DC

## 2023-03-27 MED ORDER — PANTOPRAZOLE SODIUM 40 MG IV SOLR
40.0000 mg | Freq: Every day | INTRAVENOUS | Status: DC
  Administered 2023-03-27 – 2023-03-29 (×3): 40 mg via INTRAVENOUS
  Filled 2023-03-27 (×3): qty 10

## 2023-03-27 MED ORDER — HYDROMORPHONE HCL 1 MG/ML IJ SOLN
0.5000 mg | INTRAMUSCULAR | Status: DC | PRN
  Administered 2023-03-28: 1 mg via INTRAVENOUS
  Filled 2023-03-27: qty 1

## 2023-03-27 MED ORDER — IOHEXOL 350 MG/ML SOLN
100.0000 mL | Freq: Once | INTRAVENOUS | Status: AC | PRN
  Administered 2023-03-27: 100 mL via INTRAVENOUS

## 2023-03-27 MED ORDER — ACETAMINOPHEN 650 MG RE SUPP: 650.0000 mg | Freq: Four times a day (QID) | RECTAL | Status: DC | PRN

## 2023-03-27 NOTE — Progress Notes (Signed)
Upon turning patient for ADL care, patient yelled out and opened eyes

## 2023-03-27 NOTE — Progress Notes (Signed)
   03/27/23 0609  Level of Consciousness  Level of Consciousness Unresponsive  MEWS Score  MEWS Temp 0  MEWS Systolic 0  MEWS Pulse 0  MEWS RR 0  MEWS LOC 3  MEWS Score 3  MEWS Score Color Yellow  Provider Notification  Provider Name/Title Dr. Nelson Chimes  Date Provider Notified 03/27/23  Method of Notification Page  Notification Reason Other (Comment)  Provider response No new orders     03/27/23 0609  Level of Consciousness  Level of Consciousness Unresponsive  MEWS Score  MEWS Temp 0  MEWS Systolic 0  MEWS Pulse 0  MEWS RR 0  MEWS LOC 3  MEWS Score 3  MEWS Score Color Yellow  Provider Notification  Provider Name/Title Dr. Nelson Chimes  Date Provider Notified 03/27/23  Method of Notification Page  Notification Reason Other (Comment)  Provider response No new orders

## 2023-03-27 NOTE — ED Notes (Signed)
Second ultrasound IV attempted without success; pt with a 22g to R hand for access.

## 2023-03-27 NOTE — ED Notes (Signed)
Ultrasound IV in right AC infiltrated CT contrast; IV removed by CT and pressure bandage/ice pack applied.

## 2023-03-27 NOTE — Progress Notes (Signed)
Patient non responsive and nonverbal, Dr. Carren Rang made aware.

## 2023-03-27 NOTE — Consult Note (Signed)
Consultation Note Date: 03/27/2023   Patient Name: Denise Savage  DOB: 1961/01/25  MRN: [id number]  Age / Sex: 62 y.o., female  PCP: Denise Sciara, MD Referring Physician: Lilyan Gilford, DO  Reason for Consultation: Establishing goals of care  HPI/Patient Profile: 62 y.o. 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   Clinical Assessment and Goals of Care: I have reviewed medical records including EPIC notes, labs and imaging, received report from RN, assessed the patient.  Denise Savage is lying quietly in bed.  She appears acutely/chronically ill.  She does not respond to me in any meaningul way.  She cannot make her basic needs known.  There is no family at bedside at this time.   Call to daughter/Legal guardian, Denise Savage, to discuss diagnosis prognosis, GOC, EOL wishes, disposition and options.  I introduced Palliative Medicine as specialized medical care for people living with serious illness. It focuses on providing relief from the symptoms and stress of a serious illness. The goal is to improve quality of life for both the patient and the family.  We focused on their current illness.  We talked about presumed UTI and the treatment plan.  We talked about time for outcomes.  We talked about blood cultures and urine cultures pending.  No questions at this time.  The natural disease trajectory and expectations at EOL were discussed.  Advanced directives, concepts specific to code status, artifical feeding and hydration, and rehospitalization were considered and discussed.   Denise Savage had MOLST form completed May 2023 stating full scope/full code.  Discussed the importance of continued conversation with family and the medical providers regarding overall plan of care and treatment  options, ensuring decisions are within the context of the patient's values and GOCs.  Questions and concerns were addressed.  The family was encouraged to call with questions or concerns.  PMT will continue to support holistically.   HCPOA  LEGAL GUARDIAN - daughter Denise Savage.      SUMMARY OF RECOMMENDATIONS   Continue full scope/full code 24 to 48 hours for information gathering Return to [facility] under long-term care when stable    Code Status/Advance Care Planning: Full code  Symptom Management:  Per hospitalist, no additional needs at this time.  Palliative Prophylaxis:  Frequent Pain Assessment, Oral Care, and Turn Reposition  Additional Recommendations (Limitations, Scope, Preferences): Full Scope Treatment  Psycho-social/Spiritual:  Desire for further Chaplaincy support:no Additional Recommendations: Caregiving  Support/Resources  Prognosis:  Unable to determine, based on outcomes.   Discharge Planning:  anticipate return to LTC at [facility]        Primary Diagnoses: Present on Admission:  Acute pancreatitis   I have reviewed the medical record, interviewed the patient and family, and examined the patient. The following aspects are pertinent.  Past Medical History:  Diagnosis Date   Abdominal hernia    Anxiety    Depression  Dysphagia    G tube feedings (HCC)    G tube feedings (HCC)    Gastrostomy status (HCC)    GERD (gastroesophageal reflux disease)    Hemiplegia and hemiparesis following cerebral infarction affecting right dominant side (HCC)    Non-verbal learning disorder    Pulmonary embolus (HCC)    Seizure (HCC)    Seizures (HCC)    Stroke (HCC)    Wheelchair dependent    Social History   Socioeconomic History   Marital status: Single    Spouse name: Not on file   Number of children: Not on file   Years of education: Not on file   Highest education level: Not on file  Occupational History   Not on file  Tobacco  Use   Smoking status: Never   Smokeless tobacco: Never  Vaping Use   Vaping status: Never Used  Substance and Sexual Activity   Alcohol use: No   Drug use: No   Sexual activity: Not Currently    Comment: tubal ligation  Other Topics Concern   Not on file  Social History Narrative   [facility]    Social Determinants of Health   Financial Resource Strain: Not on file  Food Insecurity: Patient Unable To Answer (03/27/2023)   Hunger Vital Sign    Worried About Running Out of Food in the Last Year: Patient unable to answer    Ran Out of Food in the Last Year: Patient unable to answer  Transportation Needs: Unknown (03/27/2023)   PRAPARE - Administrator, Civil Service (Medical): Patient unable to answer    Lack of Transportation (Non-Medical): Patient declined  Physical Activity: Not on file  Stress: Not on file  Social Connections: Not on file   Family History  Problem Relation Age of Onset   Pancreatitis Neg Hx    Scheduled Meds:  lacosamide  100 mg Per Tube BID   pantoprazole (PROTONIX) IV  40 mg Intravenous Daily   valproic acid  750 mg Per Tube TID   Continuous Infusions:  lactated ringers 100 mL/hr at 03/27/23 1239   piperacillin-tazobactam (ZOSYN)  IV 3.375 g (03/27/23 0930)   PRN Meds:.acetaminophen **OR** acetaminophen, HYDROmorphone (DILAUDID) injection, prochlorperazine Medications Prior to Admission:  Prior to Admission medications   Medication Sig Start Date End Date Taking? Authorizing Provider  acetaminophen (TYLENOL) 325 MG tablet Place 650 mg into feeding tube in the morning, at noon, in the evening, and at bedtime. (0000, 0600, 1200, 1800)    [provider]  baclofen (OZOBAX) 5 MG/5ML SOLN 5mL (5mg ) per tube every night 03/23/23   Van Clines, MD  Dextromethorphan-quiNIDine (NUEDEXTA) 20-10 MG capsule 1 capsule every 12 (twelve) hours. Via G-Tube (0900 & 2100)    [provider]  fiber supplement, BANATROL TF,  liquid Place 60 mLs into feeding tube 3 (three) times daily. 02/03/23   Marguerita Merles Latif, DO  glycopyrrolate (ROBINUL) 1 MG tablet Take 1 mg by mouth every 8 (eight) hours as needed (increased secretions).    [provider]  hydrOXYzine (ATARAX) 10 MG/5ML syrup Place 12.5 mLs (25 mg total) into feeding tube every 6 (six) hours as needed for anxiety. 02/03/23   Marguerita Merles Latif, DO  lacosamide (VIMPAT) 10 MG/ML oral solution Take by mouth 2 (two) times daily. 03/13/23   [provider]  LORazepam (ATIVAN) 1 MG tablet Place 1 tablet (1 mg total) into feeding tube in the morning, at noon, in the evening,  and at bedtime. (0000, 0600, 1200, 1800) 02/03/23   Sheikh, Kateri Mc Latif, DO  medroxyPROGESTERone (DEPO-PROVERA) 150 MG/ML injection Inject 1 mL (150 mg total) into the muscle every 3 (three) months. 07/29/22 03/23/23  Myna Hidalgo, DO  Melatonin 10 MG TABS Take 10 mg by mouth at bedtime. (2100)    [provider]  Mouthwashes (MOUTH RINSE) LIQD solution 15 mLs by Mouth Rinse route in the morning, at noon, in the evening, and at bedtime. 02/03/23   Marguerita Merles Latif, DO  mupirocin ointment (BACTROBAN) 2 % Place 1 Application into the nose 2 (two) times daily. 02/03/23   Marguerita Merles Latif, DO  Nutritional Supplements (ISOSOURCE 1.5 CAL) LIQD Place 237 mLs into feeding tube 5 (five) times daily. (0600, 1000, 1400, 1800 & 2200)    [provider]  omeprazole (KONVOMEP) 2 mg/mL SUSP oral suspension Place 20 mg into feeding tube daily.    [provider]  polyethylene glycol (MIRALAX / GLYCOLAX) 17 g packet Take 17 g by mouth 2 (two) times daily.    [provider]  traZODone (DESYREL) 50 MG tablet Take 75 mg by mouth at bedtime. (2200) 02/21/21   [provider]  valproic acid (DEPAKENE) 250 MG/5ML solution Place 15 mLs (750 mg total) into feeding tube 3 (three) times daily. 02/03/23   Marguerita Merles Latif, DO  Water For Irrigation, Sterile  (FREE WATER) SOLN Place 30-240 mLs into feeding tube See admin instructions. Flush feeding tube with 240 mls of water twice daily between feedings, flush with 60 ml of water before and after initiating feedings &  Flush peg tube with 30 cc of water before and after medication administration    [provider]   Allergies  Allergen Reactions   Hydrocodone Nausea Only   Penicillins     Unknown reaction- tolerated Zosyn dose 10/14   Review of Systems  Unable to perform ROS: Patient nonverbal    Physical Exam Vitals and nursing note reviewed.  Constitutional:      General: She is not in acute distress.    Appearance: She is ill-appearing.  HENT:     Mouth/Throat:     Mouth: Mucous membranes are dry.  Cardiovascular:     Rate and Rhythm: Normal rate.  Pulmonary:     Effort: Pulmonary effort is normal. No respiratory distress.  Skin:    General: Skin is warm and dry.     Vital Signs: BP (!) 142/92 (BP Location: Left Arm)   Pulse 88   Temp 98.4 F (36.9 C) (Oral)   Resp 18   Ht 5\' 3"  (1.6 m)   Wt 90.7 kg   SpO2 100%   BMI 35.42 kg/m  Pain Scale: PAINAD       SpO2: SpO2: 100 % O2 Device:SpO2: 100 % O2 Flow Rate: .   IO: Intake/output summary:  Intake/Output Summary (Last 24 hours) at 03/27/2023 1526 Last data filed at 03/27/2023 9485 Gross per 24 hour  Intake 110.22 ml  Output --  Net 110.22 ml    LBM:   Baseline Weight: Weight: 90.7 kg Most recent weight: Weight: 90.7 kg     Palliative Assessment/Data:     Time In: 1500 Time Out: 1540 Time Total: 40 minutes  Greater than 50%  of this time was spent counseling and coordinating care related to the above assessment and plan.  Signed by: Katheran Awe, NP   Please contact Palliative Medicine Team phone at [phone] for questions and concerns.  For individual provider: See Loretha Stapler

## 2023-03-27 NOTE — H&P (Signed)
History and Physical    Patient: Denise Savage HQI:[id number] DOB: 15-Sep-1960 DOA: 03/26/2023 DOS: the patient was seen and examined on 03/27/2023 PCP: Babs Sciara, MD  Patient coming from: SNF  Chief Complaint:  Chief Complaint  Patient presents with   Fever   HPI: Denise Savage is a 62 y.o. 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  Presented to the hospital secondary to worsening baseline mentation and fever.  Workup in the emergency department demonstrating concerns for acute UTI and CT abdomen and pelvis suggesting the presence of acute pancreatitis.  TR[facility] has been contacted to place patient in the hospital for further evaluation and management.  Review of Systems: unable to review all systems due to the inability of the patient to answer questions. Past Medical History:  Diagnosis Date   Abdominal hernia    Anxiety    Depression    Dysphagia    G tube feedings (HCC)    G tube feedings (HCC)    Gastrostomy status (HCC)    GERD (gastroesophageal reflux disease)    Hemiplegia and hemiparesis following cerebral infarction affecting right dominant side (HCC)    Non-verbal learning disorder    Pulmonary embolus (HCC)    Seizure (HCC)    Seizures (HCC)    Stroke [facility])    Wheelchair dependent    Past Surgical History:  Procedure Laterality Date   CRANIOPLASTY Left 03/23/2021   Procedure: CRANIOPLASTY, HARVEST ABDOMINAL BONE FLAP;  Surgeon: Lisbeth Renshaw, MD;  Location: [facility] OR;  Service: Neurosurgery;  Laterality: Left;   CRANIOTOMY Left 06/19/2020   Procedure: CRANIOTOMY HEMATOMA EVACUATION SUBDURAL;  Surgeon: Jadene Pierini, MD;  Location: [facility] OR;  Service: Neurosurgery;  Laterality: Left;   ESOPHAGOGASTRODUODENOSCOPY N/A 07/01/2020    Procedure: ESOPHAGOGASTRODUODENOSCOPY (EGD);  Surgeon: Diamantina Monks, MD;  Location: [facility] ENDOSCOPY;  Service: General;  Laterality: N/A;   GASTROSTOMY  01/27/2022   IR ANGIO INTRA EXTRACRAN SEL INTERNAL CAROTID BILAT MOD SED  06/16/2020   IR ANGIO VERTEBRAL SEL VERTEBRAL UNI R MOD SED  06/16/2020   IR ANGIOGRAM FOLLOW UP STUDY  06/16/2020   IR ANGIOGRAM FOLLOW UP STUDY  06/16/2020   IR ANGIOGRAM FOLLOW UP STUDY  06/16/2020   IR CT HEAD LTD  06/16/2020   IR NEURO EACH ADD'L AFTER BASIC UNI RIGHT (MS)  06/16/2020   IR PATIENT EVAL TECH 0-60 MINS  01/24/2022   IR RADIOLOGIST EVAL & MGMT  01/24/2022   IR REPLC GASTRO/COLONIC TUBE PERCUT W/FLUORO  10/03/2022   IR TRANSCATH/EMBOLIZ  06/16/2020   IR US GUIDE VASC ACCESS RIGHT  06/16/2020   PEG PLACEMENT N/A 07/01/2020   Procedure: PERCUTANEOUS ENDOSCOPIC GASTROSTOMY (PEG) PLACEMENT;  Surgeon: Diamantina Monks, MD;  Location: [facility] ENDOSCOPY;  Service: General;  Laterality: N/A;   RADIOLOGY WITH ANESTHESIA N/A 06/16/2020   Procedure: IR WITH ANESTHESIA;  Surgeon: Lisbeth Renshaw, MD;  Location: [facility] OR;  Service: Radiology;  Laterality: N/A;   TUBAL LIGATION     VENTRAL HERNIA REPAIR N/A 12/29/2014   Procedure: LAPAROSCOPIC VENTRAL HERNIA WITH MESH;  Surgeon: Franky Macho, MD;  Location: [facility] ORS;  Service: General;  Laterality: N/A;   Social History:  reports that she has never smoked. She has never used smokeless tobacco. She reports that she does not drink alcohol and does not use drugs.  Allergies  Allergen Reactions   Hydrocodone Nausea Only   Penicillins     Unknown reaction- tolerated Zosyn dose 10/14    Family History  Problem Relation Age of Onset   Pancreatitis Neg Hx     Prior to Admission medications   Medication Sig Start Date End Date Taking? Authorizing Provider  acetaminophen (TYLENOL) 325 MG tablet Place 650 mg into feeding tube in the morning, at noon, in the evening, and at bedtime. (0000, 0600, 1200, 1800)   Yes [provider]   antiseptic oral rinse (CPC / CETYLPYRIDINIUM CHLORIDE 0.05%) 0.05 % LIQD solution 15 mLs by Mouth Rinse route in the morning, at noon, in the evening, and at bedtime. For mouth care use mouth swabs to clean out mouth   Yes [provider]  baclofen (OZOBAX) 5 MG/5ML SOLN 5mL (5mg ) per tube every night Patient taking differently: Place 5 mLs into feeding tube at bedtime. 03/23/23  Yes Van Clines, MD  Dextromethorphan-quiNIDine (NUEDEXTA) 20-10 MG capsule 1 capsule every 12 (twelve) hours. Via G-tube   Yes [provider]  fiber supplement, BANATROL TF, liquid Place 60 mLs into feeding tube 3 (three) times daily. 02/03/23  Yes Savage, Denise Latif, DO  glycopyrrolate (ROBINUL) 1 MG tablet Take 1 mg by mouth every 8 (eight) hours as needed (increased secretions).   Yes [provider]  hydrOXYzine (ATARAX) 10 MG/5ML syrup Place 12.5 mLs (25 mg total) into feeding tube every 6 (six) hours as needed for anxiety. 02/03/23  Yes Savage, Denise Latif, DO  lacosamide (VIMPAT) 10 MG/ML oral solution Take 10 mLs by mouth 2 (two) times daily. 03/13/23  Yes [provider]  LORazepam (ATIVAN) 1 MG tablet Place 1 tablet (1 mg total) into feeding tube in the morning, at noon, in the evening, and at bedtime. (0000, 0600, 1200, 1800) 02/03/23  Yes Savage, Denise Latif, DO  medroxyPROGESTERone (DEPO-PROVERA) 150 MG/ML injection Inject 1 mL (150 mg total) into the muscle every 3 (three) months. Patient taking differently: Inject 150 mg into the muscle every 3 (three) months. Starting on the 1st for 3 days for abnormal uterine bleeding 07/29/22 03/27/23 Yes Myna Hidalgo, DO  Melatonin 10 MG TABS Take 10 mg by mouth at bedtime. (2100)   Yes [provider]  Nutritional Supplements (ISOSOURCE 1.5 CAL) LIQD Place 237 mLs into feeding tube 5 (five) times daily. (0600, 1000, 1400, 1800 & 2200)   Yes [provider]  omeprazole (KONVOMEP) 2 mg/mL SUSP oral suspension Place 20  mg into feeding tube daily.   Yes [provider]  polyethylene glycol (MIRALAX / GLYCOLAX) 17 g packet Take 17 g by mouth 2 (two) times daily.   Yes [provider]  traZODone (DESYREL) 50 MG tablet Take 75 mg by mouth at bedtime. (2200) 02/21/21  Yes [provider]  valproic acid (DEPAKENE) 250 MG/5ML solution Place 15 mLs (750 mg total) into feeding tube 3 (three) times daily. 02/03/23  Yes Savage, Denise Latif, DO  Water For Irrigation, Sterile (FREE WATER) SOLN Place 30-240 mLs into feeding tube See admin instructions. Flush feeding tube with 240 mls of water twice daily between feedings, flush with 60 ml of water before and after initiating feedings &  Flush peg tube with 30 cc of water before and after medication administration   Yes [provider]    Physical Exam: Vitals:   03/27/23 0500 03/27/23 0543 03/27/23 1003 03/27/23 1435  BP: 130/72 (!) 141/91 (!) 143/95 (!) 142/92  Pulse:  61 72 74 88  Resp: 17 18 18    Temp:  99 F (37.2 C) 99 F (37.2 C) 98.4 F (36.9 C)  TempSrc:  Oral Oral Oral  SpO2: 98% 98% 98% 100%  Weight:      Height:       General exam: Somnolent; nonverbal at baseline.  Currently afebrile and demonstrating good saturation on room air. Respiratory system: Clear to auscultation. Respiratory effort normal.  No using accessory muscles. Cardiovascular system:RRR. No rubs or gallops; no JVD. Gastrointestinal system: Abdomen is nondistended soft and not eliciting any grimacing patient phase on palpation.  But nonverbal at baseline to express the presence or not of discomfort. Central nervous system: No new focal deficit. Extremities: No cyanosis or clubbing; trace edema appreciated bilaterally. Skin: No petechiae. Psychiatry: Judgement and insight unable to assess secondary to underlying aphasia process.  Data Reviewed: Lipase: 86 Ammonia: 24 Lactic acid: 1.3 Urinalysis: Specific gravity 1.0 24 negative nitrite, moderate  leukocyte esterase, rare bacteria and positive protein appreciated. Basic metabolic panel: Sodium 136, potassium 3.6, chloride 104, bicarb 25, BUN 17, creatinine 0.61 and GFR > 60. CBC: WBC 3.6, hemoglobin 10.5 and platelet count 139K.   Assessment and Plan: 1-fever -With concerns for UTI -Follow culture result -Continue current antibiotics-maintain adequate hydration -Continue supportive care.  2-history of previous stroke -No neurodeficits currently appreciated -Continue rifaximin medication and secondary prevention.  3-concern for pancreatitis -Slowly resume PEG tube feedings -Follow lipase and clinical response -Currently no nausea or vomiting appreciated -Unable to properly assess for any pain. -Patient is somnolent on examination and will be judicious regarding pain medication  4-history of seizure disorder -Continue antiepileptic drugs.  5-social ethics -Palliative care will be consulted -Patient is currently full code. -Follow clinical response.    Advance Care Planning:   Code Status: Full Code   Consults: Palliative care service  Family Communication: No family at bedside.  Severity of Illness: The appropriate patient status for this patient is INPATIENT. Inpatient status is judged to be reasonable and necessary in order to provide the required intensity of service to ensure the patient's safety. The patient's presenting symptoms, physical exam findings, and initial radiographic and laboratory data in the context of their chronic comorbidities is felt to place them at high risk for further clinical deterioration. Furthermore, it is not anticipated that the patient will be medically stable for discharge from the hospital within 2 midnights of admission.   * I certify that at the point of admission it is my clinical judgment that the patient will require inpatient hospital care spanning beyond 2 midnights from the point of admission due to high intensity of service,  high risk for further deterioration and high frequency of surveillance required.*  Author: Vassie Loll, MD 03/27/2023 6:56 PM  For on call review [email or web address].

## 2023-03-28 DIAGNOSIS — K859 Acute pancreatitis without necrosis or infection, unspecified: Secondary | ICD-10-CM | POA: Diagnosis not present

## 2023-03-28 DIAGNOSIS — N390 Urinary tract infection, site not specified: Secondary | ICD-10-CM | POA: Diagnosis not present

## 2023-03-28 LAB — BLOOD CULTURE ID PANEL (REFLEXED) - BCID2

## 2023-03-28 LAB — PHOSPHORUS: Phosphorus: 2.6 mg/dL (ref 2.5–4.6)

## 2023-03-28 LAB — COMPREHENSIVE METABOLIC PANEL
ALT: 9 U/L (ref 0–44)
AST: 16 U/L (ref 15–41)
Albumin: 2.4 g/dL — ABNORMAL LOW (ref 3.5–5.0)
Alkaline Phosphatase: 48 U/L (ref 38–126)
Anion gap: 9 (ref 5–15)
BUN: 16 mg/dL (ref 8–23)
CO2: 25 mmol/L (ref 22–32)
Calcium: 10 mg/dL (ref 8.9–10.3)
Chloride: 104 mmol/L (ref 98–111)
Creatinine, Ser: 0.63 mg/dL (ref 0.44–1.00)
GFR, Estimated: >60 mL/min (ref >60)
Glucose, Bld: 84 mg/dL (ref 70–99)
Potassium: 3.9 mmol/L (ref 3.5–5.1)
Sodium: 138 mmol/L (ref 135–145)
Total Bilirubin: 0.3 mg/dL (ref 0.3–1.2)
Total Protein: 6.2 g/dL — ABNORMAL LOW (ref 6.5–8.1)

## 2023-03-28 LAB — CBC
HCT: 40 % (ref 36.0–46.0)
Hemoglobin: 12.1 g/dL (ref 12.0–15.0)
MCH: 26 pg (ref 26.0–34.0)
MCHC: 30.3 g/dL (ref 30.0–36.0)
MCV: 85.8 fL (ref 80.0–100.0)
Platelets: 152 10*3/uL (ref 150–400)
RBC: 4.66 MIL/uL (ref 3.87–5.11)
RDW: 23.1 % — ABNORMAL HIGH (ref 11.5–15.5)
WBC: 6.5 10*3/uL (ref 4.0–10.5)
nRBC: 0 % (ref 0.0–0.2)

## 2023-03-28 LAB — MAGNESIUM: Magnesium: 1.8 mg/dL (ref 1.7–2.4)

## 2023-03-28 MED ORDER — FREE WATER
150.0000 mL | Status: DC
  Administered 2023-03-28 – 2023-03-29 (×7): 150 mL

## 2023-03-28 MED ORDER — JEVITY 1.5 CAL/FIBER PO LIQD: 237.0000 mL | Freq: Every day | ORAL | Status: DC

## 2023-03-28 MED ORDER — VANCOMYCIN HCL 2000 MG/400ML IV SOLN
2000.0000 mg | Freq: Once | INTRAVENOUS | Status: AC
  Administered 2023-03-28: 2000 mg via INTRAVENOUS
  Filled 2023-03-28: qty 400

## 2023-03-28 MED ORDER — FREE WATER: 120.0000 mL | Freq: Every day | Status: DC

## 2023-03-28 MED ORDER — JEVITY 1.5 CAL/FIBER PO LIQD
237.0000 mL | Freq: Every day | ORAL | Status: DC
  Filled 2023-03-28 (×6): qty 237

## 2023-03-28 MED ORDER — VANCOMYCIN HCL 500 MG/100ML IV SOLN
500.0000 mg | Freq: Two times a day (BID) | INTRAVENOUS | Status: DC
  Administered 2023-03-28 – 2023-03-29 (×2): 500 mg via INTRAVENOUS
  Filled 2023-03-28 (×2): qty 100

## 2023-03-28 MED ORDER — VANCOMYCIN HCL 2000 MG/400ML IV SOLN: 2000.0000 mg | Freq: Once | INTRAVENOUS | Status: DC

## 2023-03-28 MED ORDER — JEVITY 1.5 CAL/FIBER PO LIQD
1000.0000 mL | ORAL | Status: DC
  Administered 2023-03-28: 1000 mL
  Filled 2023-03-28 (×3): qty 1000

## 2023-03-28 MED ORDER — FREE WATER: 240.0000 mL | Freq: Two times a day (BID) | Status: DC

## 2023-03-28 NOTE — Progress Notes (Signed)
Progress Note   Patient: Denise Savage WGN:[id number] DOB: 11-May-1961 DOA: 03/26/2023     1 DOS: the patient was seen and examined on 03/28/2023   Brief hospital course: Denise Savage is a 62 y.o. 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  Presented to the hospital secondary to worsening baseline mentation and fever.   Workup in the emergency department demonstrating concerns for acute UTI and CT abdomen and pelvis suggesting the presence of acute pancreatitis.   TRH has been contacted to place patient in the hospital for further evaluation and management.  Assessment and Plan: 1-fever/UTI -Continue to follow culture result -Continue current antibiotic -Maintain adequate hydration and supportive care -Patient responding adequately and on today's examination more alert and most likely closer to her baseline.  2-history of seizure disorder -Continue home antiepileptic regimen -No seizure activity appreciated.  3-history of previous stroke -Continue risk factor modifications and secondary prevention -No acute deficits or changes seen.  4-concerns for pancreatitis -No abdominal pain appreciated -Normal WBCs currently same. -Will resume tube feedings and follow response. -Repeat lipase in AM.  5-social ethics/disposition -Return back to a skilled nursing facility in the next 24-48 hours -Patient remains full scope of practice -Recommending outpatient goals of care discussion.  6-class II obesity -Body mass index is 35.42 kg/m. -Continue monitoring calorie intake and portion management.   Subjective:  No fever, no chest pain, no nausea vomiting reported.  Good saturation on room air.  Physical Exam: Vitals:   03/27/23 1435 03/27/23 1942 03/28/23 0423  03/28/23 1256  BP: (!) 142/92 (!) 142/89 121/86 120/80  Pulse: 88 91 (!) 107 85  Resp:      Temp: 98.4 F (36.9 C) 98.9 F (37.2 C) 98.6 F (37 C) 98.5 F (36.9 C)  TempSrc: Oral Axillary Axillary Oral  SpO2: 100% 100% 99% 100%  Weight:      Height:       General exam: Alert, awake, nonverbal; more interactive and able to track on today's examination. Respiratory system: Clear to auscultation. Respiratory effort normal.  Good saturation on room air. Cardiovascular system: No rubs or gallops; no JVD. Gastrointestinal system: Abdomen is nondistended, soft and nontender on palpation.  Positive bowel sounds.  PEG tube in place. Central nervous system: No new focal neurological deficits. Extremities: No cyanosis or clubbing; trace edema appreciated bilaterally. Skin: No petechiae. Psychiatry: Patient nonverbal at baseline.  Unable to properly assess.   Data Reviewed: CBC: White blood cell 6.5, hemoglobin 12.1 and platelet count 152K Magnesium: 1.8 Phosphorus: 2.6 CMET: Sodium 138, potassium 3.9, chloride 104, bicarb 25, BUN 16, creatinine 0.63, normal LFTs and GFR > 60   Family Communication: No family at bedside.  Disposition: Status is: Inpatient Remains inpatient appropriate because: Continue IV antibiotics.  Planned Discharge Destination: Skilled nursing facility    Time spent: 50 minutes  Author: Vassie Loll, MD 03/28/2023 5:35 PM  For on call review [email or web address].

## 2023-03-28 NOTE — Plan of Care (Signed)

## 2023-03-28 NOTE — Progress Notes (Signed)
Pharmacy Antibiotic Note  Denise Savage is a 62 y.o. female admitted on 03/26/2023 with concerns for acute UTI and pancreatitis, now with staph epidermis with mecA gene in 2/3 blood cultures (same set). Pharmacy has been consulted for vancomycin dosing.  -WBC 3.6, sCr 0.61, lactate 1.3, afebrile  Plan: -Zosyn 3.375gm IV every 8 hours per MD -Vancomycin 2g IV x1 -Vancomycin 500mg  IV every 12 hours (AUC 404, Vd 0.5, IBW) -Monitor renal function -Follow up repeat cultures given MRSE growing from same set and not other, possible contamination will start vancomycin at this time -Follow up signs of clinical improvement, LOT, de-escalation of antibiotics.  Height: 5\' 3"  (160 cm) Weight: 90.7 kg (199 lb 15.3 oz) IBW/kg (Calculated) : 52.4  Temp (24hrs), Avg:98.8 F (37.1 C), Min:98.4 F (36.9 C), Max:99 F (37.2 C)  Recent Labs  Lab 03/26/23 2328 03/27/23 0129  WBC 3.6*  --   CREATININE 0.61  --   LATICACIDVEN 1.6 1.3    Estimated Creatinine Clearance: 77.9 mL/min (by C-G formula based on SCr of 0.61 mg/dL).    Allergies  Allergen Reactions   Hydrocodone Nausea Only   Penicillins     Unknown reaction- tolerated Zosyn dose 10/14    Antimicrobials this admission: Zosyn 10/14 >>  Vancomycin 10/15 >>   Microbiology results: 10/14 BCx: MRSE in one set, other culture NG <12 hours 10/14 UCx: sent  10/14 MRSA PCR: negative  Thank you for allowing pharmacy to be a part of this patient's care.  Arabella Merles, PharmD. Clinical Pharmacist 03/28/2023 12:52 AM

## 2023-03-28 NOTE — Progress Notes (Addendum)
Initial Nutrition Assessment  DOCUMENTATION CODES:   Not applicable  INTERVENTION:   Initiate tube feeding via PEG: Jevity 1.5 at 50 ml/h (1200 ml per day) Provides 1800 kcal, 77 gm protein, 912 ml free water daily.  Free water flushes 150 ml every 4 hours for additional 900 ml free water.   When discharged back to nursing facility, can transition to previous bolus regimen: Isosource 1.5 237 ml 5 times per day. Free water flushes 60 ml before and after each bolus feeding, 240 ml BID between feedings, and 30 ml before and after medications   NUTRITION DIAGNOSIS:   Inadequate oral intake related to inability to eat as evidenced by NPO status.  GOAL:   Patient will meet greater than or equal to 90% of their needs  MONITOR:   TF tolerance  REASON FOR ASSESSMENT:   Consult Enteral/tube feeding initiation and management  ASSESSMENT:   62 yo female admitted with UTI. PMH includes seizures, dysphagia, PEG, R hemiplegia & hemiparesis S/P stroke, GERD, wheelchair dependent, non-verbal.  On admission, CT was concerning for acute pancreatitis.   PEG TF held yesterday. Received MD Consult for TF initiation and management today.   PTA TF regimen: Isosource 1.5 237 ml 5 times per day; free water flushes 60 ml before and after each bolus feeding, 240 ml BID between feedings, and 30 ml before and after medications. She was also receiving Banatrol TID via tube PTA.   Spoke with RN. Only 1 L RTH bottles of Jevity 1.5 are available. Will order continuous TF while in the hospital and can transition back to bolus regimen with Isosource 1.5 when discharged to nursing facility.   Labs and medications reviewed.  Patient with some mild muscle depletion related to being inactive, wheelchair dependent.   NUTRITION - FOCUSED PHYSICAL EXAM:  Flowsheet Row Most Recent Value  Orbital Region No depletion  Upper Arm Region No depletion  Thoracic and Lumbar Region No depletion  Buccal Region No  depletion  Temple Region No depletion  Clavicle Bone Region No depletion  Clavicle and Acromion Bone Region No depletion  Scapular Bone Region No depletion  Dorsal Hand No depletion  Patellar Region Mild depletion  Anterior Thigh Region Mild depletion  Posterior Calf Region Mild depletion  Edema (RD Assessment) Mild  Hair Reviewed  Eyes Reviewed  Mouth Unable to assess  Skin Reviewed  Nails Reviewed       Diet Order:   Diet Order     None       EDUCATION NEEDS:   No education needs have been identified at this time  Skin:  Skin Assessment: Reviewed RN Assessment  Last BM:  unknown  Height:   Ht Readings from Last 1 Encounters:  03/26/23 5\' 3"  (1.6 m)    Weight:   Wt Readings from Last 1 Encounters:  03/26/23 90.7 kg    Ideal Body Weight:  52.3 kg  BMI:  Body mass index is 35.42 kg/m.  Estimated Nutritional Needs:   Kcal:  1600-1900  Protein:  75-95  gm  Fluid:  1.6-1.9 L   Gabriel Rainwater RD, LDN, CNSC Please refer to Amion for contact information.

## 2023-03-28 NOTE — TOC Initial Note (Signed)
Transition of Care [facility]) - Initial/Assessment Note    Patient Details  Name: Denise Savage MRN: [id number] Date of Birth: 09-Jan-1961  Transition of Care [facility]) CM/SW Contact:    Villa Herb, LCSWA Phone Number: 03/28/2023, 11:48 AM  Clinical Narrative:                 CSW completed chart review and notes that pt admitted from [facility]. CSW spoke to [place] in admissions who states pt is long term resident and can return when medically stable. CSW spoke with pts daughter who is also pts legal guardian to update. TOC to follow.   Expected Discharge Plan: Long Term Nursing Home Barriers to Discharge: Continued Medical Work up   Patient Goals and CMS Choice Patient states their goals for this hospitalization and ongoing recovery are:: return to [email or web address] Compare Post Acute Care list provided to:: Legal Guardian Choice offered to / list presented to : [facility] POA / Guardian      Expected Discharge Plan and Services In-house Referral: Clinical Social Work Discharge Planning Services: CM Consult Post Acute Care Choice: Nursing Home Living arrangements for the past 2 months: Skilled Nursing Facility                                      Prior Living Arrangements/Services Living arrangements for the past 2 months: Skilled Nursing Facility Lives with:: Facility Resident Patient language and need for interpreter reviewed:: Yes Do you feel safe going back to the place where you live?: Yes      Need for Family Participation in Patient Care: Yes (Comment) Care giver support system in place?: Yes (comment)   Criminal Activity/Legal Involvement Pertinent to Current Situation/Hospitalization: No - Comment as needed  Activities of Daily Living   ADL Screening (condition at time of admission) Independently performs ADLs?: No Does the patient have a NEW difficulty with bathing/dressing/toileting/self-feeding that is expected to last >3 days?: No Does  the patient have a NEW difficulty with getting in/out of bed, walking, or climbing stairs that is expected to last >3 days?: No Does the patient have a NEW difficulty with communication that is expected to last >3 days?: No Is the patient deaf or have difficulty hearing?: Yes Does the patient have difficulty seeing, even when wearing glasses/contacts?: Yes Does the patient have difficulty concentrating, remembering, or making decisions?: Yes  Permission Sought/Granted                  Emotional Assessment         Alcohol / Substance Use: Not Applicable Psych Involvement: No (comment)  Admission diagnosis:  Acute pancreatitis [K85.90] Urinary tract infection without hematuria, site unspecified [N39.0] Acute pancreatitis, unspecified complication status, unspecified pancreatitis type [K85.90] Patient Active Problem List   Diagnosis Date Noted   Acute pancreatitis 03/27/2023   UTI (urinary tract infection) 01/27/2023   Severe anemia 07/13/2022   Skull defect 03/23/2021   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 [place] [address] Phone: [phone] Fax: [phone]  [facility] - [place], [place] - [address] [place] [address] Phone: [phone] Fax: [phone]  [facility] - [place], [place] - [address] [place] [address] Phone: [phone] Fax: [phone]     Social Determinants of Health (SDOH) Social History: SDOH Screenings   Food  Insecurity: Patient Unable To Answer (03/27/2023)  Housing: Patient Declined (03/27/2023)  Transportation Needs: Unknown (03/27/2023)  Utilities: Patient Unable To Answer (03/27/2023)  Tobacco Use: Low Risk  (03/27/2023)   SDOH Interventions:     Readmission Risk Interventions    09/08/2020    8:04 AM  Readmission Risk Prevention Plan  Transportation Screening Complete  PCP or Specialist Appt within 5-7 Days Complete  Home Care Screening Complete  Medication Review (RN CM) Complete

## 2023-03-28 NOTE — Progress Notes (Signed)
At nursing facility she receives Jevity 1.5  per feeding 5 times a day at 0600, 1000, 1400, 1800, and 2200 with 60ml water flush before/after feeds. She also receives water flushes twice a day per PEG tube

## 2023-03-29 DIAGNOSIS — K859 Acute pancreatitis without necrosis or infection, unspecified: Secondary | ICD-10-CM | POA: Diagnosis not present

## 2023-03-29 LAB — BASIC METABOLIC PANEL
Anion gap: 9 (ref 5–15)
BUN: 22 mg/dL (ref 8–23)
CO2: 25 mmol/L (ref 22–32)
Calcium: 9.8 mg/dL (ref 8.9–10.3)
Chloride: 105 mmol/L (ref 98–111)
Creatinine, Ser: 0.68 mg/dL (ref 0.44–1.00)
GFR, Estimated: >60 mL/min (ref >60)
Glucose, Bld: 115 mg/dL — ABNORMAL HIGH (ref 70–99)
Potassium: 3.5 mmol/L (ref 3.5–5.1)
Sodium: 139 mmol/L (ref 135–145)

## 2023-03-29 LAB — CBC
HCT: 34.4 % — ABNORMAL LOW (ref 36.0–46.0)
Hemoglobin: 10.7 g/dL — ABNORMAL LOW (ref 12.0–15.0)
MCH: 26.4 pg (ref 26.0–34.0)
MCHC: 31.1 g/dL (ref 30.0–36.0)
MCV: 84.9 fL (ref 80.0–100.0)
Platelets: 151 10*3/uL (ref 150–400)
RBC: 4.05 MIL/uL (ref 3.87–5.11)
RDW: 22.9 % — ABNORMAL HIGH (ref 11.5–15.5)
WBC: 7.2 10*3/uL (ref 4.0–10.5)
nRBC: 0 % (ref 0.0–0.2)

## 2023-03-29 LAB — URINE CULTURE: Culture: >=100000 — AB

## 2023-03-29 LAB — LIPASE, BLOOD: Lipase: 287 U/L — ABNORMAL HIGH (ref 11–51)

## 2023-03-29 MED ORDER — ACETAMINOPHEN 160 MG/5ML PO SOLN
ORAL | Status: AC
  Administered 2023-03-29: 650 mg
  Filled 2023-03-29: qty 20.3

## 2023-03-29 MED ORDER — LORAZEPAM 1 MG PO TABS: 1.0000 mg | ORAL_TABLET | Freq: Four times a day (QID) | ORAL | 0 refills | Status: DC

## 2023-03-29 MED ORDER — FOSFOMYCIN TROMETHAMINE 3 G PO PACK
3.0000 g | PACK | Freq: Once | ORAL | Status: AC
  Administered 2023-03-29: 3 g
  Filled 2023-03-29: qty 3

## 2023-03-29 MED ORDER — ACETAMINOPHEN 160 MG/5ML PO SOLN: 650.0000 mg | Freq: Four times a day (QID) | ORAL | Status: DC | PRN

## 2023-03-29 MED ORDER — BACLOFEN 5 MG/5ML PO SOLN: 5.0000 mg | Freq: Every day | ORAL | 0 refills | Status: AC

## 2023-03-29 NOTE — Progress Notes (Signed)
Palliative: Chart review completed.  Denise Savage is to return to [facility] under long-term care.  Her goals are set by her daughter/legal guardian, Kamry Faraci.  PMT to shadow.  No charge Lillia Carmel, NP Palliative medicine team Phone [phone]

## 2023-03-29 NOTE — Discharge Summary (Signed)
Physician Discharge Summary  Denise Savage ZOX:[id number] DOB: 1961/01/09 DOA: 03/26/2023  PCP: Babs Sciara, MD  Admit date: 03/26/2023  Discharge date: 03/29/2023  Admitted From:SNF  Disposition:  SNF  Recommendations for Outpatient Follow-up:  Follow up with PCP in 1-2 weeks Continue on medications as prior  Home Health: None  Equipment/Devices: Tube feedings  Discharge Condition:Stable  CODE STATUS: Full  Diet recommendation: Prior tube feedings with Jevity 1.5  Brief/Interim Summary: Denise Savage is a 62 y.o. 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  Presented to the hospital secondary to worsening baseline mentation and fever.   Workup in the emergency department demonstrating concerns for acute UTI and CT abdomen and pelvis suggesting the presence of acute pancreatitis.  She is now tolerating tube feeds and pancreatitis has resolved.  Her final urine cultures came back with ESBL E. coli and therefore she will be given a dose of fosfomycin prior to discharge.  No other acute events or concerns noted and she is in stable condition to return back to her facility today.  Discharge Diagnoses:  Active Problems:   Acute pancreatitis  Principal discharge diagnosis: Acute pancreatitis along with ESBL E. coli UTI.  Discharge Instructions  Discharge Instructions     Diet - low sodium heart healthy   Complete by: As directed    Increase activity slowly   Complete by: As directed       Allergies as of 03/29/2023       Reactions   Hydrocodone Nausea Only        Medication List     TAKE these medications    acetaminophen 325 MG tablet Commonly known as: TYLENOL Place 650 mg into feeding tube in the morning, at noon, in the evening, and at  bedtime. (0000, 0600, 1200, 1800)   antiseptic oral rinse 0.05 % Liqd solution Commonly known as: CPC / CETYLPYRIDINIUM CHLORIDE 0.05% 15 mLs by Mouth Rinse route in the morning, at noon, in the evening, and at bedtime. For mouth care use mouth swabs to clean out mouth   baclofen 5 MG/5ML Soln Commonly known as: OZOBAX Place 5 mLs (5 mg total) into feeding tube at bedtime for 5 days.   fiber supplement (BANATROL TF) liquid Place 60 mLs into feeding tube 3 (three) times daily.   free water Soln Place 30-240 mLs into feeding tube See admin instructions. Flush feeding tube with 240 mls of water twice daily between feedings, flush with 60 ml of water before and after initiating feedings &  Flush peg tube with 30 cc of water before and after medication administration   glycopyrrolate 1 MG tablet Commonly known as: ROBINUL Take 1 mg by mouth every 8 (eight) hours as needed (increased secretions).   hydrOXYzine 10 MG/5ML syrup Commonly known as: ATARAX Place 12.5 mLs (25 mg total) into feeding tube every 6 (six) hours as needed for anxiety.   Isosource 1.5 Cal Liqd Place 237 mLs into feeding tube 5 (five) times daily. (0600, 1000, 1400, 1800 & 2200)   lacosamide 10 MG/ML oral solution Commonly known as: VIMPAT Take 10 mLs by mouth 2 (two) times daily.   LORazepam 1 MG tablet Commonly known as: ATIVAN Place 1 tablet (1 mg total) into feeding tube in the morning, at noon, in the evening, and at bedtime. (0000, 0600, 1200, 1800)  medroxyPROGESTERone 150 MG/ML injection Commonly known as: DEPO-PROVERA Inject 1 mL (150 mg total) into the muscle every 3 (three) months. What changed: additional instructions   Melatonin 10 MG Tabs Take 10 mg by mouth at bedtime. (2100)   Nuedexta 20-10 MG capsule Generic drug: Dextromethorphan-quiNIDine 1 capsule every 12 (twelve) hours. Via G-tube   omeprazole 2 mg/mL Susp oral suspension Commonly known as: KONVOMEP Place 20 mg into feeding tube  daily.   polyethylene glycol 17 g packet Commonly known as: MIRALAX / GLYCOLAX Take 17 g by mouth 2 (two) times daily.   traZODone 50 MG tablet Commonly known as: DESYREL Take 75 mg by mouth at bedtime. (2200)   valproic acid 250 MG/5ML solution Commonly known as: DEPAKENE Place 15 mLs (750 mg total) into feeding tube 3 (three) times daily.        Follow-up Information     Luking, Jonna Coup, MD. Schedule an appointment as soon as possible for a visit in 1 week(s).   Specialty: Family Medicine Contact information: [address] [place] [address] [phone]                Allergies  Allergen Reactions   Hydrocodone Nausea Only    Consultations: None   Procedures/Studies: CT CHEST ABDOMEN PELVIS WO CONTRAST  Result Date: 03/27/2023 CLINICAL DATA:  Fever, sepsis. EXAM: CT CHEST, ABDOMEN AND PELVIS WITHOUT CONTRAST TECHNIQUE: Multidetector CT imaging of the chest, abdomen and pelvis was performed following the standard protocol without IV contrast. RADIATION DOSE REDUCTION: This exam was performed according to the departmental dose-optimization program which includes automated exposure control, adjustment of the mA and/or kV according to patient size and/or use of iterative reconstruction technique. COMPARISON:  02/03/2023. FINDINGS: CT CHEST FINDINGS Cardiovascular: The heart is mildly enlarged and there is no pericardial effusion. There is minimal atherosclerotic calcification of the aorta without evidence of aneurysm. The pulmonary trunk is normal in caliber. Mediastinum/Nodes: No mediastinal or axillary lymphadenopathy. Evaluation of the hila is limited due to lack of IV contrast. The thyroid gland and trachea are within normal limits. A small amount of fluid is noted in the proximal to mid esophagus. A small hiatal hernia is noted. Lungs/Pleura: Atelectasis is present bilaterally. No effusion or pneumothorax. Musculoskeletal: Degenerative changes are  present in the thoracic spine. No acute osseous abnormality. CT ABDOMEN PELVIS FINDINGS Hepatobiliary: A subcentimeter hypodensity is noted in the posterior right lobe of the liver which is too small to further characterize. No biliary ductal dilatation. The gallbladder is without stones. Pancreas: The pancreatic head is mildly enlarged and heterogeneous in the associated surrounding fat stranding is noted. No pancreatic ductal dilatation. Spleen: Normal in size without focal abnormality. Adrenals/Urinary Tract: The adrenal glands are within normal limits. A cyst is present in the lower pole of the left kidney. Excreted contrast is present at the renal pyramids and collecting systems bilaterally limiting evaluation for renal or ureteral calculus. No hydroureteronephrosis bilaterally. The bladder is unremarkable. Stomach/Bowel: There is a small hiatal hernia. A PEG tube terminates in the stomach. No bowel obstruction, free air, or pneumatosis. The appendix is not seen. No focal bowel wall thickening is seen. Vascular/Lymphatic: Aortic atherosclerosis. No enlarged abdominal or pelvic lymph nodes. Reproductive: The uterus is enlarged for patient's stated age and unchanged from the previous exam. No adnexal mass is seen. Other: No abdominopelvic ascites. A fat containing umbilical hernia is present. Musculoskeletal: Degenerative changes are present in the lumbar spine. No acute osseous abnormality. IMPRESSION: 1. Heterogeneous  enlargement of the pancreatic head with surrounding fat stranding suggesting pancreatitis. Correlation with amylase and lipase is recommended. 2. Small hiatal hernia. 3. Aortic atherosclerosis. Electronically Signed   By: Thornell Sartorius M.D.   On: 03/27/2023 03:42   DG Chest Port 1 View  Result Date: 03/26/2023 CLINICAL DATA:  Questionable sepsis EXAM: PORTABLE CHEST 1 VIEW COMPARISON:  02/01/2023 FINDINGS: Heart and mediastinal contours are within normal limits. No focal opacities or  effusions. No acute bony abnormality. IMPRESSION: No active disease. Electronically Signed   By: Charlett Nose M.D.   On: 03/26/2023 23:22     Discharge Exam: Vitals:   03/29/23 0420 03/29/23 1344  BP: 120/79 (!) 141/75  Pulse: 96 85  Resp: 18 18  Temp: 98.9 F (37.2 C) 100.1 F (37.8 C)  SpO2: 98% 99%   Vitals:   03/28/23 1256 03/28/23 1933 03/29/23 0420 03/29/23 1344  BP: 120/80 (!) 122/101 120/79 (!) 141/75  Pulse: 85 62 96 85  Resp:  18 18 18   Temp: 98.5 F (36.9 C) 98.8 F (37.1 C) 98.9 F (37.2 C) 100.1 F (37.8 C)  TempSrc: Oral Axillary Axillary Oral  SpO2: 100% 100% 98% 99%  Weight:      Height:        General: Pt is alert, awake, not in acute distress Cardiovascular: RRR, S1/S2 +, no rubs, no gallops Respiratory: CTA bilaterally, no wheezing, no rhonchi Abdominal: Soft, NT, ND, bowel sounds +, tube present C/D/I Extremities: no edema, no cyanosis    The results of significant diagnostics from this hospitalization (including imaging, microbiology, ancillary and laboratory) are listed below for reference.     Microbiology: Recent Results (from the past 240 hour(s))  Blood Culture (routine x 2)     Status: None (Preliminary result)   Collection Time: 03/26/23 11:28 PM   Specimen: BLOOD LEFT HAND  Result Value Ref Range Status   Specimen Description BLOOD LEFT HAND BLOOD  Final   Special Requests   Final    BOTTLES DRAWN AEROBIC ONLY Blood Culture adequate volume   Culture   Final    NO GROWTH 3 DAYS Performed at [facility], [address], [place], [place] [address]    Report Status PENDING  Incomplete  Resp panel by RT-PCR (RSV, Flu A&B, Covid) Anterior Nasal Swab     Status: None   Collection Time: 03/26/23 11:30 PM   Specimen: Anterior Nasal Swab  Result Value Ref Range Status   SARS Coronavirus 2 by RT PCR NEGATIVE NEGATIVE Final    Comment: (NOTE) SARS-CoV-2 target nucleic acids are NOT DETECTED.  The SARS-CoV-2 RNA is generally detectable in  upper respiratory specimens during the acute phase of infection. The lowest concentration of SARS-CoV-2 viral copies this assay can detect is 138 copies/mL. A negative result does not preclude SARS-Cov-2 infection and should not be used as the sole basis for treatment or other patient management decisions. A negative result may occur with  improper specimen collection/handling, submission of specimen other than nasopharyngeal swab, presence of viral mutation(s) within the areas targeted by this assay, and inadequate number of viral copies(<138 copies/mL). A negative result must be combined with clinical observations, patient history, and epidemiological information. The expected result is Negative.  Fact Sheet for Patients:  BloggerCourse.com  Fact Sheet for Healthcare Providers:  SeriousBroker.it  This test is no t yet approved or cleared by the [place] FDA and  has been authorized for detection and/or diagnosis of SARS-CoV-2 by FDA under an Emergency  Use Authorization (EUA). This EUA will remain  in effect (meaning this test can be used) for the duration of the COVID-19 declaration under Section 564(b)(1) of the Act, 21 U.S.C.section 360bbb-3(b)(1), unless the authorization is terminated  or revoked sooner.       Influenza A by PCR NEGATIVE NEGATIVE Final   Influenza B by PCR NEGATIVE NEGATIVE Final    Comment: (NOTE) The Xpert Xpress SARS-CoV-2/FLU/RSV plus assay is intended as an aid in the diagnosis of influenza from Nasopharyngeal swab specimens and should not be used as a sole basis for treatment. Nasal washings and aspirates are unacceptable for Xpert Xpress SARS-CoV-2/FLU/RSV testing.  Fact Sheet for Patients: BloggerCourse.com  Fact Sheet for Healthcare Providers: SeriousBroker.it  This test is not yet approved or cleared by the [place] FDA and has been  authorized for detection and/or diagnosis of SARS-CoV-2 by FDA under an Emergency Use Authorization (EUA). This EUA will remain in effect (meaning this test can be used) for the duration of the COVID-19 declaration under Section 564(b)(1) of the Act, 21 U.S.C. section 360bbb-3(b)(1), unless the authorization is terminated or revoked.     Resp Syncytial Virus by PCR NEGATIVE NEGATIVE Final    Comment: (NOTE) Fact Sheet for Patients: BloggerCourse.com  Fact Sheet for Healthcare Providers: SeriousBroker.it  This test is not yet approved or cleared by the [place] FDA and has been authorized for detection and/or diagnosis of SARS-CoV-2 by FDA under an Emergency Use Authorization (EUA). This EUA will remain in effect (meaning this test can be used) for the duration of the COVID-19 declaration under Section 564(b)(1) of the Act, 21 U.S.C. section 360bbb-3(b)(1), unless the authorization is terminated or revoked.  Performed at [facility], [address] [place], [place] [address]   Blood Culture (routine x 2)     Status: Abnormal (Preliminary result)   Collection Time: 03/26/23 11:48 PM   Specimen: BLOOD  Result Value Ref Range Status   Specimen Description   Final    BLOOD BLOOD LEFT ARM Performed at [facility], [address] [place], [place] [address]    Special Requests   Final    BOTTLES DRAWN AEROBIC AND ANAEROBIC Blood Culture results may not be optimal due to an excessive volume of blood received in culture bottles Performed at [facility], [address] [place], [place] [address]    Culture  Setup Time   Final    GRAM POSITIVE COCCI IN BOTH AEROBIC AND ANAEROBIC BOTTLES CRITICAL RESULT CALLED TO, READ BACK BY AND VERIFIED WITH: BASS,B AT 1602 ON 03/27/23 BY PURDIE,J CRITICAL RESULT CALLED TO, READ BACK BY AND VERIFIED WITH: J MUSE,RN@0021  03/28/23 MK    Culture (A)  Final    STAPHYLOCOCCUS  EPIDERMIDIS STAPHYLOCOCCUS CAPITIS THE SIGNIFICANCE OF ISOLATING THIS ORGANISM FROM A SINGLE SET OF BLOOD CULTURES WHEN MULTIPLE SETS ARE DRAWN IS UNCERTAIN. PLEASE NOTIFY THE MICROBIOLOGY DEPARTMENT WITHIN ONE WEEK IF SPECIATION AND SENSITIVITIES ARE REQUIRED. Performed at [facility], [address], [place], [place] [address]    Report Status PENDING  Incomplete  Blood Culture ID Panel (Reflexed)     Status: Abnormal   Collection Time: 03/26/23 11:48 PM  Result Value Ref Range Status   Enterococcus faecalis NOT DETECTED NOT DETECTED Final   Enterococcus Faecium NOT DETECTED NOT DETECTED Final   Listeria monocytogenes NOT DETECTED NOT DETECTED Final   Staphylococcus species DETECTED (A) NOT DETECTED Final    Comment: CRITICAL RESULT CALLED TO, READ BACK BY AND VERIFIED  WITH: J MUSE,RN@0022  10/15/24MK    Staphylococcus aureus (BCID) NOT DETECTED NOT DETECTED Final   Staphylococcus epidermidis DETECTED (A) NOT DETECTED Final    Comment: Methicillin (oxacillin) resistant coagulase negative staphylococcus. Possible blood culture contaminant (unless isolated from more than one blood culture draw or clinical case suggests pathogenicity). No antibiotic treatment is indicated for blood  culture contaminants. CRITICAL RESULT CALLED TO, READ BACK BY AND VERIFIED WITH: J MUSE,RN@0022  03/28/23 MK    Staphylococcus lugdunensis NOT DETECTED NOT DETECTED Final   Streptococcus species NOT DETECTED NOT DETECTED Final   Streptococcus agalactiae NOT DETECTED NOT DETECTED Final   Streptococcus pneumoniae NOT DETECTED NOT DETECTED Final   Streptococcus pyogenes NOT DETECTED NOT DETECTED Final   A.calcoaceticus-baumannii NOT DETECTED NOT DETECTED Final   Bacteroides fragilis NOT DETECTED NOT DETECTED Final   Enterobacterales NOT DETECTED NOT DETECTED Final   Enterobacter cloacae complex NOT DETECTED NOT DETECTED Final   Escherichia coli NOT DETECTED NOT DETECTED Final   Klebsiella aerogenes NOT  DETECTED NOT DETECTED Final   Klebsiella oxytoca NOT DETECTED NOT DETECTED Final   Klebsiella pneumoniae NOT DETECTED NOT DETECTED Final   Proteus species NOT DETECTED NOT DETECTED Final   Salmonella species NOT DETECTED NOT DETECTED Final   Serratia marcescens NOT DETECTED NOT DETECTED Final   Haemophilus influenzae NOT DETECTED NOT DETECTED Final   Neisseria meningitidis NOT DETECTED NOT DETECTED Final   Pseudomonas aeruginosa NOT DETECTED NOT DETECTED Final   Stenotrophomonas maltophilia NOT DETECTED NOT DETECTED Final   Candida albicans NOT DETECTED NOT DETECTED Final   Candida auris NOT DETECTED NOT DETECTED Final   Candida glabrata NOT DETECTED NOT DETECTED Final   Candida krusei NOT DETECTED NOT DETECTED Final   Candida parapsilosis NOT DETECTED NOT DETECTED Final   Candida tropicalis NOT DETECTED NOT DETECTED Final   Cryptococcus neoformans/gattii NOT DETECTED NOT DETECTED Final   Methicillin resistance mecA/C DETECTED (A) NOT DETECTED Final    Comment: CRITICAL RESULT CALLED TO, READ BACK BY AND VERIFIED WITH: J MUSE,RN@0022  03/28/23 MK Performed at [facility], [address], [place], [place] [address]   Urine Culture     Status: Abnormal   Collection Time: 03/27/23 12:04 AM   Specimen: Urine, Clean Catch  Result Value Ref Range Status   Specimen Description   Final    URINE, CLEAN CATCH Performed at [facility], [address] [place], [place] [address]    Special Requests   Final    NONE Performed at [facility], [address] [place], [place] [address]    Culture (A)  Final    >=100,000 COLONIES/mL ESCHERICHIA COLI Confirmed Extended Spectrum Beta-Lactamase Producer (ESBL).  In bloodstream infections from ESBL organisms, carbapenems are preferred over piperacillin/tazobactam. They are shown to have a lower risk of mortality.    Report Status 03/29/2023 FINAL  Final   Organism ID, Bacteria ESCHERICHIA COLI (A)  Final      Susceptibility   Escherichia  coli - MIC*    AMPICILLIN >=32 RESISTANT Resistant     CEFAZOLIN >=64 RESISTANT Resistant     CEFEPIME 16 RESISTANT Resistant     CEFTRIAXONE >=64 RESISTANT Resistant     CIPROFLOXACIN >=4 RESISTANT Resistant     GENTAMICIN >=16 RESISTANT Resistant     IMIPENEM <=0.25 SENSITIVE Sensitive     NITROFURANTOIN <=16 SENSITIVE Sensitive     TRIMETH/SULFA <=20 SENSITIVE Sensitive     AMPICILLIN/SULBACTAM 8 SENSITIVE Sensitive     PIP/TAZO <=4 SENSITIVE Sensitive ug/mL    * >=  100,000 COLONIES/mL ESCHERICHIA COLI  MRSA Next Gen by PCR, Nasal     Status: None   Collection Time: 03/27/23  6:16 AM   Specimen: Nasal Mucosa; Nasal Swab  Result Value Ref Range Status   MRSA by PCR Next Gen NOT DETECTED NOT DETECTED Final    Comment: (NOTE) The GeneXpert MRSA Assay (FDA approved for NASAL specimens only), is one component of a comprehensive MRSA colonization surveillance program. It is not intended to diagnose MRSA infection nor to guide or monitor treatment for MRSA infections. Test performance is not FDA approved in patients less than 51 years old. Performed at [facility], [address] [place], [place] [address]      Labs: BNP (last 3 results) No results for input(s): "BNP" in the last 8760 hours. Basic Metabolic Panel: Recent Labs  Lab 03/26/23 2328 03/28/23 0401 03/29/23 0416  NA 136 138 139  K 3.6 3.9 3.5  CL 104 104 105  CO2 25 25 25   GLUCOSE 110* 84 115*  BUN 17 16 22   CREATININE 0.61 0.63 0.68  CALCIUM 10.0 10.0 9.8  MG 1.8 1.8  --   PHOS  --  2.6  --    Liver Function Tests: Recent Labs  Lab 03/26/23 2328 03/28/23 0401  AST 18 16  ALT 12 9  ALKPHOS 51 48  BILITOT 0.4 0.3  PROT 6.5 6.2*  ALBUMIN 2.6* 2.4*   Recent Labs  Lab 03/27/23 0129 03/29/23 0416  LIPASE 86* 287*   Recent Labs  Lab 03/27/23 0643  AMMONIA 24   CBC: Recent Labs  Lab 03/26/23 2328 03/28/23 0401 03/29/23 0416  WBC 3.6* 6.5 7.2  NEUTROABS 0.9*  --   --   HGB 10.5* 12.1 10.7*   HCT 34.6* 40.0 34.4*  MCV 86.9 85.8 84.9  PLT 139* 152 151   Cardiac Enzymes: No results for input(s): "CKTOTAL", "CKMB", "CKMBINDEX", "TROPONINI" in the last 168 hours. BNP: Invalid input(s): "POCBNP" CBG: No results for input(s): "GLUCAP" in the last 168 hours. D-Dimer No results for input(s): "DDIMER" in the last 72 hours. Hgb A1c No results for input(s): "HGBA1C" in the last 72 hours. Lipid Profile No results for input(s): "CHOL", "HDL", "LDLCALC", "TRIG", "CHOLHDL", "LDLDIRECT" in the last 72 hours. Thyroid function studies No results for input(s): "TSH", "T4TOTAL", "T3FREE", "THYROIDAB" in the last 72 hours.  Invalid input(s): "FREET3" Anemia work up No results for input(s): "VITAMINB12", "FOLATE", "FERRITIN", "TIBC", "IRON", "RETICCTPCT" in the last 72 hours. Urinalysis    Component Value Date/Time   COLORURINE YELLOW 03/27/2023 0004   APPEARANCEUR HAZY (A) 03/27/2023 0004   LABSPEC 1.024 03/27/2023 0004   PHURINE 6.0 03/27/2023 0004   GLUCOSEU NEGATIVE 03/27/2023 0004   HGBUR NEGATIVE 03/27/2023 0004   BILIRUBINUR NEGATIVE 03/27/2023 0004   KETONESUR 5 (A) 03/27/2023 0004   PROTEINUR 30 (A) 03/27/2023 0004   NITRITE NEGATIVE 03/27/2023 0004   LEUKOCYTESUR MODERATE (A) 03/27/2023 0004   Sepsis Labs Recent Labs  Lab 03/26/23 2328 03/28/23 0401 03/29/23 0416  WBC 3.6* 6.5 7.2   Microbiology Recent Results (from the past 240 hour(s))  Blood Culture (routine x 2)     Status: None (Preliminary result)   Collection Time: 03/26/23 11:28 PM   Specimen: BLOOD LEFT HAND  Result Value Ref Range Status   Specimen Description BLOOD LEFT HAND BLOOD  Final   Special Requests   Final    BOTTLES DRAWN AEROBIC ONLY Blood Culture adequate volume   Culture   Final  NO GROWTH 3 DAYS Performed at [facility], [address], [place], [place] [address]    Report Status PENDING  Incomplete  Resp panel by RT-PCR (RSV, Flu A&B, Covid) Anterior Nasal Swab     Status: None    Collection Time: 03/26/23 11:30 PM   Specimen: Anterior Nasal Swab  Result Value Ref Range Status   SARS Coronavirus 2 by RT PCR NEGATIVE NEGATIVE Final    Comment: (NOTE) SARS-CoV-2 target nucleic acids are NOT DETECTED.  The SARS-CoV-2 RNA is generally detectable in upper respiratory specimens during the acute phase of infection. The lowest concentration of SARS-CoV-2 viral copies this assay can detect is 138 copies/mL. A negative result does not preclude SARS-Cov-2 infection and should not be used as the sole basis for treatment or other patient management decisions. A negative result may occur with  improper specimen collection/handling, submission of specimen other than nasopharyngeal swab, presence of viral mutation(s) within the areas targeted by this assay, and inadequate number of viral copies(<138 copies/mL). A negative result must be combined with clinical observations, patient history, and epidemiological information. The expected result is Negative.  Fact Sheet for Patients:  BloggerCourse.com  Fact Sheet for Healthcare Providers:  SeriousBroker.it  This test is no t yet approved or cleared by the [place] FDA and  has been authorized for detection and/or diagnosis of SARS-CoV-2 by FDA under an Emergency Use Authorization (EUA). This EUA will remain  in effect (meaning this test can be used) for the duration of the COVID-19 declaration under Section 564(b)(1) of the Act, 21 U.S.C.section 360bbb-3(b)(1), unless the authorization is terminated  or revoked sooner.       Influenza A by PCR NEGATIVE NEGATIVE Final   Influenza B by PCR NEGATIVE NEGATIVE Final    Comment: (NOTE) The Xpert Xpress SARS-CoV-2/FLU/RSV plus assay is intended as an aid in the diagnosis of influenza from Nasopharyngeal swab specimens and should not be used as a sole basis for treatment. Nasal washings and aspirates are unacceptable for  Xpert Xpress SARS-CoV-2/FLU/RSV testing.  Fact Sheet for Patients: BloggerCourse.com  Fact Sheet for Healthcare Providers: SeriousBroker.it  This test is not yet approved or cleared by the [place] FDA and has been authorized for detection and/or diagnosis of SARS-CoV-2 by FDA under an Emergency Use Authorization (EUA). This EUA will remain in effect (meaning this test can be used) for the duration of the COVID-19 declaration under Section 564(b)(1) of the Act, 21 U.S.C. section 360bbb-3(b)(1), unless the authorization is terminated or revoked.     Resp Syncytial Virus by PCR NEGATIVE NEGATIVE Final    Comment: (NOTE) Fact Sheet for Patients: BloggerCourse.com  Fact Sheet for Healthcare Providers: SeriousBroker.it  This test is not yet approved or cleared by the [place] FDA and has been authorized for detection and/or diagnosis of SARS-CoV-2 by FDA under an Emergency Use Authorization (EUA). This EUA will remain in effect (meaning this test can be used) for the duration of the COVID-19 declaration under Section 564(b)(1) of the Act, 21 U.S.C. section 360bbb-3(b)(1), unless the authorization is terminated or revoked.  Performed at [facility], [address] [place], [place] [address]   Blood Culture (routine x 2)     Status: Abnormal (Preliminary result)   Collection Time: 03/26/23 11:48 PM   Specimen: BLOOD  Result Value Ref Range Status   Specimen Description   Final    BLOOD BLOOD LEFT ARM Performed at [facility], [address] [place], [place] [address]  Special Requests   Final    BOTTLES DRAWN AEROBIC AND ANAEROBIC Blood Culture results may not be optimal due to an excessive volume of blood received in culture bottles Performed at [facility], [address], [place], [place] [address]    Culture  Setup Time   Final    GRAM POSITIVE COCCI IN BOTH  AEROBIC AND ANAEROBIC BOTTLES CRITICAL RESULT CALLED TO, READ BACK BY AND VERIFIED WITH: BASS,B AT 1602 ON 03/27/23 BY PURDIE,J CRITICAL RESULT CALLED TO, READ BACK BY AND VERIFIED WITH: J MUSE,RN@0021  03/28/23 MK    Culture (A)  Final    STAPHYLOCOCCUS EPIDERMIDIS STAPHYLOCOCCUS CAPITIS THE SIGNIFICANCE OF ISOLATING THIS ORGANISM FROM A SINGLE SET OF BLOOD CULTURES WHEN MULTIPLE SETS ARE DRAWN IS UNCERTAIN. PLEASE NOTIFY THE MICROBIOLOGY DEPARTMENT WITHIN ONE WEEK IF SPECIATION AND SENSITIVITIES ARE REQUIRED. Performed at [facility], [address] [place], [place] [address]    Report Status PENDING  Incomplete  Blood Culture ID Panel (Reflexed)     Status: Abnormal   Collection Time: 03/26/23 11:48 PM  Result Value Ref Range Status   Enterococcus faecalis NOT DETECTED NOT DETECTED Final   Enterococcus Faecium NOT DETECTED NOT DETECTED Final   Listeria monocytogenes NOT DETECTED NOT DETECTED Final   Staphylococcus species DETECTED (A) NOT DETECTED Final    Comment: CRITICAL RESULT CALLED TO, READ BACK BY AND VERIFIED WITH: J MUSE,RN@0022  10/15/24MK    Staphylococcus aureus (BCID) NOT DETECTED NOT DETECTED Final   Staphylococcus epidermidis DETECTED (A) NOT DETECTED Final    Comment: Methicillin (oxacillin) resistant coagulase negative staphylococcus. Possible blood culture contaminant (unless isolated from more than one blood culture draw or clinical case suggests pathogenicity). No antibiotic treatment is indicated for blood  culture contaminants. CRITICAL RESULT CALLED TO, READ BACK BY AND VERIFIED WITH: J MUSE,RN@0022  03/28/23 MK    Staphylococcus lugdunensis NOT DETECTED NOT DETECTED Final   Streptococcus species NOT DETECTED NOT DETECTED Final   Streptococcus agalactiae NOT DETECTED NOT DETECTED Final   Streptococcus pneumoniae NOT DETECTED NOT DETECTED Final   Streptococcus pyogenes NOT DETECTED NOT DETECTED Final   A.calcoaceticus-baumannii NOT DETECTED NOT DETECTED  Final   Bacteroides fragilis NOT DETECTED NOT DETECTED Final   Enterobacterales NOT DETECTED NOT DETECTED Final   Enterobacter cloacae complex NOT DETECTED NOT DETECTED Final   Escherichia coli NOT DETECTED NOT DETECTED Final   Klebsiella aerogenes NOT DETECTED NOT DETECTED Final   Klebsiella oxytoca NOT DETECTED NOT DETECTED Final   Klebsiella pneumoniae NOT DETECTED NOT DETECTED Final   Proteus species NOT DETECTED NOT DETECTED Final   Salmonella species NOT DETECTED NOT DETECTED Final   Serratia marcescens NOT DETECTED NOT DETECTED Final   Haemophilus influenzae NOT DETECTED NOT DETECTED Final   Neisseria meningitidis NOT DETECTED NOT DETECTED Final   Pseudomonas aeruginosa NOT DETECTED NOT DETECTED Final   Stenotrophomonas maltophilia NOT DETECTED NOT DETECTED Final   Candida albicans NOT DETECTED NOT DETECTED Final   Candida auris NOT DETECTED NOT DETECTED Final   Candida glabrata NOT DETECTED NOT DETECTED Final   Candida krusei NOT DETECTED NOT DETECTED Final   Candida parapsilosis NOT DETECTED NOT DETECTED Final   Candida tropicalis NOT DETECTED NOT DETECTED Final   Cryptococcus neoformans/gattii NOT DETECTED NOT DETECTED Final   Methicillin resistance mecA/C DETECTED (A) NOT DETECTED Final    Comment: CRITICAL RESULT CALLED TO, READ BACK BY AND VERIFIED WITH: J MUSE,RN@0022  03/28/23 MK Performed at [facility], [address], [place], [place] [address]  Urine Culture     Status: Abnormal   Collection Time: 03/27/23 12:04 AM   Specimen: Urine, Clean Catch  Result Value Ref Range Status   Specimen Description   Final    URINE, CLEAN CATCH Performed at [facility], [address] [place], [place] [address]    Special Requests   Final    NONE Performed at [facility], [address], [place], [place] [address]    Culture (A)  Final    >=100,000 COLONIES/mL ESCHERICHIA COLI Confirmed Extended Spectrum Beta-Lactamase Producer (ESBL).  In bloodstream infections  from ESBL organisms, carbapenems are preferred over piperacillin/tazobactam. They are shown to have a lower risk of mortality.    Report Status 03/29/2023 FINAL  Final   Organism ID, Bacteria ESCHERICHIA COLI (A)  Final      Susceptibility   Escherichia coli - MIC*    AMPICILLIN >=32 RESISTANT Resistant     CEFAZOLIN >=64 RESISTANT Resistant     CEFEPIME 16 RESISTANT Resistant     CEFTRIAXONE >=64 RESISTANT Resistant     CIPROFLOXACIN >=4 RESISTANT Resistant     GENTAMICIN >=16 RESISTANT Resistant     IMIPENEM <=0.25 SENSITIVE Sensitive     NITROFURANTOIN <=16 SENSITIVE Sensitive     TRIMETH/SULFA <=20 SENSITIVE Sensitive     AMPICILLIN/SULBACTAM 8 SENSITIVE Sensitive     PIP/TAZO <=4 SENSITIVE Sensitive ug/mL    * >=100,000 COLONIES/mL ESCHERICHIA COLI  MRSA Next Gen by PCR, Nasal     Status: None   Collection Time: 03/27/23  6:16 AM   Specimen: Nasal Mucosa; Nasal Swab  Result Value Ref Range Status   MRSA by PCR Next Gen NOT DETECTED NOT DETECTED Final    Comment: (NOTE) The GeneXpert MRSA Assay (FDA approved for NASAL specimens only), is one component of a comprehensive MRSA colonization surveillance program. It is not intended to diagnose MRSA infection nor to guide or monitor treatment for MRSA infections. Test performance is not FDA approved in patients less than 52 years old. Performed at [facility], [address] [place], [place] [address]      Time coordinating discharge: 35 minutes  SIGNED:   Erick [place], DO [facility] 03/29/2023, 4:16 PM  If 7PM-7AM, please contact night-coverage [email or web address]

## 2023-03-29 NOTE — Care Management Important Message (Signed)
Important Message  Patient Details  Name: Denise Savage MRN: [id number] Date of Birth: 12-15-1960   Important Message Given:  N/A - LOS <3 / Initial given by admissions     Corey Harold 03/29/2023, 4:26 PM

## 2023-03-29 NOTE — Plan of Care (Signed)
Problem: Clinical Measurements: Goal: Will remain free from infection Outcome: Progressing   Problem: Activity: Goal: Risk for activity intolerance will decrease Outcome: Not Progressing   Problem: Nutrition: Goal: Adequate nutrition will be maintained Outcome: Not Progressing

## 2023-03-29 NOTE — TOC Transition Note (Signed)
Transition of Care [facility]) - CM/SW Discharge Note   Patient Details  Name: Denise Savage MRN: [id number] Date of Birth: 1960-11-21  Transition of Care [facility] CM/SW Contact:  Villa Herb, LCSWA Phone Number: 03/29/2023, 4:09 PM   Clinical Narrative:    CSW updated that pt is medically stable for D/C back to [facility]. CSW spoke to pts legal guardian to provide update. CSW spoke to [place] in admissions who confirms they can accept pt back today. CSW provided RN with room and report numbers. CSW to complete med necessity and send to floor for RN. EMS to be called when pt is ready. TOC signing off.   Final next level of care: Long Term Nursing Home Barriers to Discharge: Barriers Resolved   Patient Goals and CMS Choice CMS Medicare.gov Compare Post Acute Care list provided to:: Legal Guardian Choice offered to / list presented to : [facility] POA / Guardian  Discharge Placement                  Patient to be transferred to facility by: EMS Name of family member notified: Daughter Patient and family notified of of transfer: 03/29/23  Discharge Plan and Services Additional resources added to the After Visit Summary for   In-house Referral: Clinical Social Work Discharge Planning Services: CM Consult Post Acute Care Choice: Nursing Home                               Social Determinants of Health (SDOH) Interventions SDOH Screenings   Food Insecurity: Patient Unable To Answer (03/27/2023)  Housing: Patient Declined (03/27/2023)  Transportation Needs: Unknown (03/27/2023)  Utilities: Patient Unable To Answer (03/27/2023)  Tobacco Use: Low Risk  (03/27/2023)     Readmission Risk Interventions    09/08/2020    8:04 AM  Readmission Risk Prevention Plan  Transportation Screening Complete  PCP or Specialist Appt within 5-7 Days Complete  Home Care Screening Complete  Medication Review (RN CM) Complete

## 2023-03-29 NOTE — Progress Notes (Signed)
Attempted multiple times to contact [facility] to give report however was unsuccessful.  Attempted to call at 1748 and 1749 with no answer at facility.  EMS left with patient at 1750 and had attempted again at 1751 to contact facility, was sent to extension [phone] and voicemail was left.  Attempted to call again at 1756 and no answer at facility.  Attempted to call again at 1809, was transferred to extension [phone], no answer, and unable to leave voicemail.

## 2023-03-30 LAB — CULTURE, BLOOD (ROUTINE X 2)

## 2023-03-31 LAB — CULTURE, BLOOD (ROUTINE X 2)
Culture: NO GROWTH
Special Requests: ADEQUATE

## 2023-07-01 ENCOUNTER — Encounter (HOSPITAL_COMMUNITY): Payer: Self-pay | Admitting: Emergency Medicine

## 2023-07-01 ENCOUNTER — Other Ambulatory Visit: Payer: Self-pay

## 2023-07-01 ENCOUNTER — Emergency Department (HOSPITAL_COMMUNITY)
Admission: EM | Admit: 2023-07-01 | Discharge: 2023-07-01 | Disposition: A | Discharge status: Home / Self Care | Payer: Medicare Other | Attending: Emergency Medicine | Admitting: Emergency Medicine

## 2023-07-01 DIAGNOSIS — K9423 Gastrostomy malfunction: Secondary | ICD-10-CM | POA: Diagnosis not present

## 2023-07-01 DIAGNOSIS — T85528A Displacement of other gastrointestinal prosthetic devices, implants and grafts, initial encounter: Secondary | ICD-10-CM

## 2023-07-01 DIAGNOSIS — R1084 Generalized abdominal pain: Secondary | ICD-10-CM | POA: Diagnosis present

## 2023-07-01 NOTE — ED Provider Notes (Signed)
[place] EMERGENCY DEPARTMENT AT [facility] Provider Note   CSN: [id number] Arrival date & time: 07/01/23  1814     History {Add pertinent medical, surgical, social history, OB history to HPI:1} No chief complaint on file.   Denise Savage is a 63 y.o. female.  Patient is bedridden and nonverbal from a stroke.  She has a PEG tube that came out   Abdominal Pain      Home Medications Prior to Admission medications   Medication Sig Start Date End Date Taking? Authorizing Provider  acetaminophen (TYLENOL) 325 MG tablet Place 650 mg into feeding tube in the morning, at noon, in the evening, and at bedtime. (0000, 0600, 1200, 1800)    [provider]  antiseptic oral rinse (CPC / CETYLPYRIDINIUM CHLORIDE 0.05%) 0.05 % LIQD solution 15 mLs by Mouth Rinse route in the morning, at noon, in the evening, and at bedtime. For mouth care use mouth swabs to clean out mouth    [provider]  Dextromethorphan-quiNIDine (NUEDEXTA) 20-10 MG capsule 1 capsule every 12 (twelve) hours. Via G-tube    [provider]  fiber supplement, BANATROL TF, liquid Place 60 mLs into feeding tube 3 (three) times daily. 02/03/23   Marguerita Merles Latif, DO  glycopyrrolate (ROBINUL) 1 MG tablet Take 1 mg by mouth every 8 (eight) hours as needed (increased secretions).    [provider]  hydrOXYzine (ATARAX) 10 MG/5ML syrup Place 12.5 mLs (25 mg total) into feeding tube every 6 (six) hours as needed for anxiety. 02/03/23   Marguerita Merles Latif, DO  lacosamide (VIMPAT) 10 MG/ML oral solution Take 10 mLs by mouth 2 (two) times daily. 03/13/23   [provider]  LORazepam (ATIVAN) 1 MG tablet Place 1 tablet (1 mg total) into feeding tube in the morning, at noon, in the evening, and at bedtime. (0000, 0600, 1200, 1800) 03/29/23   Sherryll Burger, Pratik D, DO  medroxyPROGESTERone (DEPO-PROVERA) 150 MG/ML injection Inject 1 mL (150 mg total) into the muscle every 3 (three)  months. Patient taking differently: Inject 150 mg into the muscle every 3 (three) months. Starting on the 1st for 3 days for abnormal uterine bleeding 07/29/22 03/27/23  Myna Hidalgo, DO  Melatonin 10 MG TABS Take 10 mg by mouth at bedtime. (2100)    [provider]  Nutritional Supplements (ISOSOURCE 1.5 CAL) LIQD Place 237 mLs into feeding tube 5 (five) times daily. (0600, 1000, 1400, 1800 & 2200)    [provider]  omeprazole (KONVOMEP) 2 mg/mL SUSP oral suspension Place 20 mg into feeding tube daily.    [provider]  polyethylene glycol (MIRALAX / GLYCOLAX) 17 g packet Take 17 g by mouth 2 (two) times daily.    [provider]  traZODone (DESYREL) 50 MG tablet Take 75 mg by mouth at bedtime. (2200) 02/21/21   [provider]  valproic acid (DEPAKENE) 250 MG/5ML solution Place 15 mLs (750 mg total) into feeding tube 3 (three) times daily. 02/03/23   Marguerita Merles Latif, DO  Water For Irrigation, Sterile (FREE WATER) SOLN Place 30-240 mLs into feeding tube See admin instructions. Flush feeding tube with 240 mls of water twice daily between feedings, flush with 60 ml of water before and after initiating feedings &  Flush peg tube with 30 cc of water before and after medication administration    [provider]      Allergies    Hydrocodone    Review of Systems  Review of Systems  Gastrointestinal:  Positive for abdominal pain.    Physical Exam Updated Vital Signs BP 137/78   Pulse 83   Temp 99.7 F (37.6 C) (Axillary)   Resp 18   SpO2 97%  Physical Exam  ED Results / Procedures / Treatments   Labs (all labs ordered are listed, but only abnormal results are displayed) Labs Reviewed - No data to display  EKG None  Radiology No results found.  Procedures Procedures  {Document cardiac monitor, telemetry assessment procedure when appropriate:1}  Medications Ordered in ED Medications - No data to display  ED Course/  Medical Decision Making/ A&P PEG tube was replaced without problems Click here for ABCD2, HEART and other calculatorsREFRESH Note before signing :1}                              Medical Decision Making  Dislodged PEG tube  {Document critical care time when appropriate:1} {Document review of labs and clinical decision tools ie heart score, Chads2Vasc2 etc:1}  {Document your independent review of radiology images, and any outside records:1} {Document your discussion with family members, caretakers, and with consultants:1} {Document social determinants of health affecting pt's care:1} {Document your decision making why or why not admission, treatments were needed:1} Final Clinical Impression(s) / ED Diagnoses Final diagnoses:  Dislodged gastrostomy tube    Rx / DC Orders ED Discharge Orders     None

## 2023-07-01 NOTE — ED Triage Notes (Signed)
Pt arrived via [facility] from [facility] for pulling out g tube, per EMS vitals WNL

## 2023-07-01 NOTE — Discharge Instructions (Signed)
Follow up as needed

## 2023-07-02 ENCOUNTER — Other Ambulatory Visit: Payer: Self-pay

## 2023-07-02 ENCOUNTER — Emergency Department (HOSPITAL_COMMUNITY): Payer: Medicare Other

## 2023-07-02 ENCOUNTER — Encounter (HOSPITAL_COMMUNITY): Payer: Self-pay | Admitting: Emergency Medicine

## 2023-07-02 ENCOUNTER — Emergency Department (HOSPITAL_COMMUNITY)
Admission: EM | Admit: 2023-07-02 | Discharge: 2023-07-03 | Disposition: A | Discharge status: Home / Self Care | Payer: Medicare Other | Attending: Emergency Medicine | Admitting: Emergency Medicine

## 2023-07-02 DIAGNOSIS — K9423 Gastrostomy malfunction: Secondary | ICD-10-CM | POA: Insufficient documentation

## 2023-07-02 DIAGNOSIS — Z431 Encounter for attention to gastrostomy: Secondary | ICD-10-CM

## 2023-07-02 NOTE — ED Provider Notes (Signed)
[place] EMERGENCY DEPARTMENT AT [facility] Provider Note   CSN: [id number] Arrival date & time: 07/02/23  2238     History {Add pertinent medical, surgical, social history, OB history to HPI:1} Chief Complaint  Patient presents with   Peg tube out    Denise Savage is a 63 y.o. female.  Patient returns for dislodged PEG tube.  Patient was seen in the ED yesterday for similar presentation and had her tube replaced.  Patient bedbound, nonverbal secondary to stroke.       Home Medications Prior to Admission medications   Medication Sig Start Date End Date Taking? Authorizing Provider  acetaminophen (TYLENOL) 325 MG tablet Place 650 mg into feeding tube in the morning, at noon, in the evening, and at bedtime. (0000, 0600, 1200, 1800)    [provider]  antiseptic oral rinse (CPC / CETYLPYRIDINIUM CHLORIDE 0.05%) 0.05 % LIQD solution 15 mLs by Mouth Rinse route in the morning, at noon, in the evening, and at bedtime. For mouth care use mouth swabs to clean out mouth    [provider]  Dextromethorphan-quiNIDine (NUEDEXTA) 20-10 MG capsule 1 capsule every 12 (twelve) hours. Via G-tube    [provider]  fiber supplement, BANATROL TF, liquid Place 60 mLs into feeding tube 3 (three) times daily. 02/03/23   Marguerita Merles Latif, DO  glycopyrrolate (ROBINUL) 1 MG tablet Take 1 mg by mouth every 8 (eight) hours as needed (increased secretions).    [provider]  hydrOXYzine (ATARAX) 10 MG/5ML syrup Place 12.5 mLs (25 mg total) into feeding tube every 6 (six) hours as needed for anxiety. 02/03/23   Marguerita Merles Latif, DO  lacosamide (VIMPAT) 10 MG/ML oral solution Take 10 mLs by mouth 2 (two) times daily. 03/13/23   [provider]  LORazepam (ATIVAN) 1 MG tablet Place 1 tablet (1 mg total) into feeding tube in the morning, at noon, in the evening, and at bedtime. (0000, 0600, 1200, 1800) 03/29/23   Sherryll Burger, Pratik D, DO   medroxyPROGESTERone (DEPO-PROVERA) 150 MG/ML injection Inject 1 mL (150 mg total) into the muscle every 3 (three) months. Patient taking differently: Inject 150 mg into the muscle every 3 (three) months. Starting on the 1st for 3 days for abnormal uterine bleeding 07/29/22 03/27/23  Myna Hidalgo, DO  Melatonin 10 MG TABS Take 10 mg by mouth at bedtime. (2100)    [provider]  Nutritional Supplements (ISOSOURCE 1.5 CAL) LIQD Place 237 mLs into feeding tube 5 (five) times daily. (0600, 1000, 1400, 1800 & 2200)    [provider]  omeprazole (KONVOMEP) 2 mg/mL SUSP oral suspension Place 20 mg into feeding tube daily.    [provider]  polyethylene glycol (MIRALAX / GLYCOLAX) 17 g packet Take 17 g by mouth 2 (two) times daily.    [provider]  traZODone (DESYREL) 50 MG tablet Take 75 mg by mouth at bedtime. (2200) 02/21/21   [provider]  valproic acid (DEPAKENE) 250 MG/5ML solution Place 15 mLs (750 mg total) into feeding tube 3 (three) times daily. 02/03/23   Marguerita Merles Latif, DO  Water For Irrigation, Sterile (FREE WATER) SOLN Place 30-240 mLs into feeding tube See admin instructions. Flush feeding tube with 240 mls of water twice daily between feedings, flush with 60 ml of water before and after initiating feedings &  Flush peg tube with 30 cc of water before and after medication administration    [provider]  Allergies    Hydrocodone    Review of Systems   Review of Systems  Physical Exam Updated Vital Signs BP (!) 157/100   Pulse (!) 103   Temp 99.3 F (37.4 C)   Resp 18   Ht 5\' 3"  (1.6 m)   Wt 82 kg   SpO2 96%   BMI 32.02 kg/m  Physical Exam Vitals and nursing note reviewed.  Constitutional:      General: She is not in acute distress.    Appearance: She is well-developed.  HENT:     Head: Normocephalic and atraumatic.     Mouth/Throat:     Mouth: Mucous membranes are moist.  Eyes:     General:  Vision grossly intact. Gaze aligned appropriately.     Extraocular Movements: Extraocular movements intact.     Conjunctiva/sclera: Conjunctivae normal.  Cardiovascular:     Rate and Rhythm: Normal rate and regular rhythm.     Pulses: Normal pulses.     Heart sounds: Normal heart sounds, S1 normal and S2 normal. No murmur heard.    No friction rub. No gallop.  Pulmonary:     Effort: Pulmonary effort is normal. No respiratory distress.     Breath sounds: Normal breath sounds.  Abdominal:     General: Bowel sounds are normal.     Palpations: Abdomen is soft.     Tenderness: There is no abdominal tenderness. There is no guarding or rebound.     Hernia: No hernia is present.  Musculoskeletal:        General: No swelling.     Cervical back: Full passive range of motion without pain, normal range of motion and neck supple. No spinous process tenderness or muscular tenderness. Normal range of motion.     Right lower leg: No edema.     Left lower leg: No edema.  Skin:    General: Skin is warm and dry.     Capillary Refill: Capillary refill takes less than 2 seconds.     Findings: No ecchymosis, erythema, rash or wound.     Comments: No lacerations or wounds around stoma  Neurological:     Mental Status: She is alert. Mental status is at baseline.     ED Results / Procedures / Treatments   Labs (all labs ordered are listed, but only abnormal results are displayed) Labs Reviewed - No data to display  EKG None  Radiology No results found.  Procedures Procedures  {Document cardiac monitor, telemetry assessment procedure when appropriate:1}  Medications Ordered in ED Medications - No data to display  ED Course/ Medical Decision Making/ A&P   {   Click here for ABCD2, HEART and other calculatorsREFRESH Note before signing :1}                              Medical Decision Making Amount and/or Complexity of Data Reviewed Radiology: ordered.   ***  {Document critical care  time when appropriate:1} {Document review of labs and clinical decision tools ie heart score, Chads2Vasc2 etc:1}  {Document your independent review of radiology images, and any outside records:1} {Document your discussion with family members, caretakers, and with consultants:1} {Document social determinants of health affecting pt's care:1} {Document your decision making why or why not admission, treatments were needed:1} Final Clinical Impression(s) / ED Diagnoses Final diagnoses:  None    Rx / DC Orders ED Discharge Orders     None

## 2023-07-02 NOTE — ED Triage Notes (Signed)
Facility found pt's peg tube in the floor beside of her beside.

## 2023-08-23 ENCOUNTER — Other Ambulatory Visit: Payer: Self-pay

## 2023-08-23 ENCOUNTER — Encounter (HOSPITAL_COMMUNITY): Payer: Self-pay

## 2023-08-23 ENCOUNTER — Emergency Department (HOSPITAL_COMMUNITY)

## 2023-08-23 ENCOUNTER — Emergency Department (HOSPITAL_COMMUNITY)
Admission: EM | Admit: 2023-08-23 | Discharge: 2023-08-24 | Disposition: A | Discharge status: Home / Self Care | Source: Home / Self Care | Attending: Student | Admitting: Student

## 2023-08-23 DIAGNOSIS — Z8673 Personal history of transient ischemic attack (TIA), and cerebral infarction without residual deficits: Secondary | ICD-10-CM | POA: Insufficient documentation

## 2023-08-23 DIAGNOSIS — R7881 Bacteremia: Secondary | ICD-10-CM | POA: Diagnosis not present

## 2023-08-23 DIAGNOSIS — N3001 Acute cystitis with hematuria: Secondary | ICD-10-CM | POA: Insufficient documentation

## 2023-08-23 DIAGNOSIS — R000 Tachycardia, unspecified: Secondary | ICD-10-CM | POA: Insufficient documentation

## 2023-08-23 LAB — URINALYSIS, ROUTINE W REFLEX MICROSCOPIC
Bilirubin Urine: NEGATIVE
Glucose, UA: NEGATIVE mg/dL
Ketones, ur: NEGATIVE mg/dL
Nitrite: NEGATIVE
Protein, ur: 30 mg/dL — AB
Specific Gravity, Urine: <1.005 — ABNORMAL LOW (ref 1.005–1.030)
pH: 6 (ref 5.0–8.0)

## 2023-08-23 LAB — CBC WITH DIFFERENTIAL/PLATELET
Abs Immature Granulocytes: 0.02 10*3/uL (ref 0.00–0.07)
Basophils Absolute: 0 10*3/uL (ref 0.0–0.1)
Basophils Relative: 0 %
Eosinophils Absolute: 0.1 10*3/uL (ref 0.0–0.5)
Eosinophils Relative: 1 %
HCT: 35.9 % — ABNORMAL LOW (ref 36.0–46.0)
Hemoglobin: 11.1 g/dL — ABNORMAL LOW (ref 12.0–15.0)
Immature Granulocytes: 0 %
Lymphocytes Relative: 20 %
Lymphs Abs: 1.5 10*3/uL (ref 0.7–4.0)
MCH: 29.1 pg (ref 26.0–34.0)
MCHC: 30.9 g/dL (ref 30.0–36.0)
MCV: 94 fL (ref 80.0–100.0)
Monocytes Absolute: 1 10*3/uL (ref 0.1–1.0)
Monocytes Relative: 13 %
Neutro Abs: 4.8 10*3/uL (ref 1.7–7.7)
Neutrophils Relative %: 66 %
Platelets: 335 10*3/uL (ref 150–400)
RBC: 3.82 MIL/uL — ABNORMAL LOW (ref 3.87–5.11)
RDW: 14.3 % (ref 11.5–15.5)
WBC: 7.4 10*3/uL (ref 4.0–10.5)
nRBC: 0 % (ref 0.0–0.2)

## 2023-08-23 LAB — RESP PANEL BY RT-PCR (RSV, FLU A&B, COVID)  RVPGX2
Influenza A by PCR: NEGATIVE
Influenza B by PCR: NEGATIVE
Resp Syncytial Virus by PCR: NEGATIVE
SARS Coronavirus 2 by RT PCR: NEGATIVE

## 2023-08-23 LAB — COMPREHENSIVE METABOLIC PANEL
ALT: 10 U/L (ref 0–44)
AST: 22 U/L (ref 15–41)
Albumin: 2.2 g/dL — ABNORMAL LOW (ref 3.5–5.0)
Alkaline Phosphatase: 62 U/L (ref 38–126)
Anion gap: 10 (ref 5–15)
BUN: 15 mg/dL (ref 8–23)
CO2: 29 mmol/L (ref 22–32)
Calcium: 10.3 mg/dL (ref 8.9–10.3)
Chloride: 96 mmol/L — ABNORMAL LOW (ref 98–111)
Creatinine, Ser: 0.59 mg/dL (ref 0.44–1.00)
GFR, Estimated: >60 mL/min (ref >60)
Glucose, Bld: 108 mg/dL — ABNORMAL HIGH (ref 70–99)
Potassium: 3.4 mmol/L — ABNORMAL LOW (ref 3.5–5.1)
Sodium: 135 mmol/L (ref 135–145)
Total Bilirubin: 0.2 mg/dL (ref 0.0–1.2)
Total Protein: 7.1 g/dL (ref 6.5–8.1)

## 2023-08-23 LAB — URINALYSIS, MICROSCOPIC (REFLEX)
RBC / HPF: >50 RBC/hpf (ref 0–5)
Squamous Epithelial / HPF: NONE SEEN /HPF (ref 0–5)

## 2023-08-23 LAB — LACTIC ACID, PLASMA: Lactic Acid, Venous: 1.9 mmol/L (ref 0.5–1.9)

## 2023-08-23 MED ORDER — SODIUM CHLORIDE 0.9 % IV SOLN
2.0000 g | Freq: Once | INTRAVENOUS | Status: AC
  Administered 2023-08-23: 2 g via INTRAVENOUS
  Filled 2023-08-23: qty 20

## 2023-08-23 MED ORDER — ACETAMINOPHEN 325 MG PO TABS
650.0000 mg | ORAL_TABLET | Freq: Once | ORAL | Status: AC
  Administered 2023-08-23: 650 mg
  Filled 2023-08-23: qty 2

## 2023-08-23 MED ORDER — CEPHALEXIN 500 MG PO CAPS: 500.0000 mg | ORAL_CAPSULE | Freq: Four times a day (QID) | ORAL | 0 refills | Status: DC

## 2023-08-23 MED ORDER — LACTATED RINGERS IV BOLUS
1000.0000 mL | Freq: Once | INTRAVENOUS | Status: AC
  Administered 2023-08-23: 1000 mL via INTRAVENOUS

## 2023-08-23 NOTE — ED Notes (Signed)
 [facility] called to transport patient. Paramedic aware.

## 2023-08-23 NOTE — Discharge Instructions (Signed)
 Denise Savage workup today shows that she has a likely urinary tract infection.  She has been given IV antibiotics here and sent back to your facility with a prescription for Keflex.  May continue her Tylenol as directed.  Please follow-up with her primary care provider for recheck early next week.  Return the emergency department for any new or worsening symptoms.

## 2023-08-23 NOTE — ED Notes (Signed)
 50 mL of water pushed with tylenol down peg tube.

## 2023-08-23 NOTE — ED Provider Notes (Signed)
 [place] EMERGENCY DEPARTMENT AT [facility] Provider Note   CSN: [id number] Arrival date & time: 08/23/23  5621     History  Chief Complaint  Patient presents with   Fever    Denise Savage is a 63 y.o. female.   Fever Associated symptoms: no cough         Denise Savage is a 63 y.o. female with past medical history of seizures, GERD, anxiety, prior stroke patient has gastrostomy tube in place, nonverbal at baseline and she resides at [facility].  Patient was brought in by EMS for evaluation of fever and lethargy.  Per EMS patient was reported to have fever of 100.6 this morning.  She was given Tylenol at 6 AM. Spoke with caregiver at the facility, stated that she was tachycardic this morning in the 110s and felt "clammy" she stated that she was not as responsive as usual.  She did have her morning medicines and caregiver states that she has not had any problems with her feeding tube.  No reported vomiting or diarrhea.   Home Medications Prior to Admission medications   Medication Sig Start Date End Date Taking? Authorizing Provider  acetaminophen (TYLENOL) 325 MG tablet Place 650 mg into feeding tube in the morning, at noon, in the evening, and at bedtime. (0000, 0600, 1200, 1800)    [provider]  antiseptic oral rinse (CPC / CETYLPYRIDINIUM CHLORIDE 0.05%) 0.05 % LIQD solution 15 mLs by Mouth Rinse route in the morning, at noon, in the evening, and at bedtime. For mouth care use mouth swabs to clean out mouth    [provider]  Dextromethorphan-quiNIDine (NUEDEXTA) 20-10 MG capsule 1 capsule every 12 (twelve) hours. Via G-tube    [provider]  fiber supplement, BANATROL TF, liquid Place 60 mLs into feeding tube 3 (three) times daily. 02/03/23   Marguerita Merles Latif, DO  glycopyrrolate (ROBINUL) 1 MG tablet Take 1 mg by mouth every 8 (eight) hours as needed (increased secretions).    [provider]  hydrOXYzine (ATARAX)  10 MG/5ML syrup Place 12.5 mLs (25 mg total) into feeding tube every 6 (six) hours as needed for anxiety. 02/03/23   Marguerita Merles Latif, DO  lacosamide (VIMPAT) 10 MG/ML oral solution Take 10 mLs by mouth 2 (two) times daily. 03/13/23   [provider]  LORazepam (ATIVAN) 1 MG tablet Place 1 tablet (1 mg total) into feeding tube in the morning, at noon, in the evening, and at bedtime. (0000, 0600, 1200, 1800) 03/29/23   Sherryll Burger, Pratik D, DO  medroxyPROGESTERone (DEPO-PROVERA) 150 MG/ML injection Inject 1 mL (150 mg total) into the muscle every 3 (three) months. Patient taking differently: Inject 150 mg into the muscle every 3 (three) months. Starting on the 1st for 3 days for abnormal uterine bleeding 07/29/22 03/27/23  Myna Hidalgo, DO  Melatonin 10 MG TABS Take 10 mg by mouth at bedtime. (2100)    [provider]  Nutritional Supplements (ISOSOURCE 1.5 CAL) LIQD Place 237 mLs into feeding tube 5 (five) times daily. (0600, 1000, 1400, 1800 & 2200)    [provider]  omeprazole (KONVOMEP) 2 mg/mL SUSP oral suspension Place 20 mg into feeding tube daily.    [provider]  polyethylene glycol (MIRALAX / GLYCOLAX) 17 g packet Take 17 g by mouth 2 (two) times daily.    [provider]  traZODone (DESYREL) 50 MG tablet Take 75 mg by mouth at bedtime. (2200) 02/21/21  [provider]  valproic acid (DEPAKENE) 250 MG/5ML solution Place 15 mLs (750 mg total) into feeding tube 3 (three) times daily. 02/03/23   Marguerita Merles Latif, DO  Water For Irrigation, Sterile (FREE WATER) SOLN Place 30-240 mLs into feeding tube See admin instructions. Flush feeding tube with 240 mls of water twice daily between feedings, flush with 60 ml of water before and after initiating feedings &  Flush peg tube with 30 cc of water before and after medication administration    [provider]      Allergies    Hydrocodone    Review of Systems   Review of Systems   Unable to perform ROS: Patient nonverbal  Constitutional:  Negative for diaphoresis and fever.  Respiratory:  Negative for cough.     Physical Exam Updated Vital Signs BP 119/76 (BP Location: Left Arm)   Pulse (!) 108   Temp 99.5 F (37.5 C) (Rectal)   Resp 18   Ht 5\' 3"  (1.6 m)   Wt 87.6 kg   SpO2 99%   BMI 34.21 kg/m  Physical Exam Vitals and nursing note reviewed.  Constitutional:      Comments: Patient is awake, nonverbal at baseline  Eyes:     Extraocular Movements: Extraocular movements intact.     Conjunctiva/sclera: Conjunctivae normal.     Pupils: Pupils are equal, round, and reactive to light.  Cardiovascular:     Rate and Rhythm: Regular rhythm. Tachycardia present.     Pulses: Normal pulses.  Pulmonary:     Effort: Pulmonary effort is normal.     Breath sounds: Rhonchi present.     Comments: Mildly rhonchorous lung sounds on exam.  No increased work of breathing.  Do not appreciate any rales or wheezing Abdominal:     Palpations: Abdomen is soft.     Comments: Gastrostomy tube in place  Musculoskeletal:     Right lower leg: No edema.     Left lower leg: No edema.  Skin:    General: Skin is warm.     Capillary Refill: Capillary refill takes less than 2 seconds.  Neurological:     Mental Status: She is alert.     Comments: Patient responsive to painful stimuli.     ED Results / Procedures / Treatments   Labs (all labs ordered are listed, but only abnormal results are displayed) Labs Reviewed  CBC WITH DIFFERENTIAL/PLATELET - Abnormal; Notable for the following components:      Result Value   RBC 3.82 (*)    Hemoglobin 11.1 (*)    HCT 35.9 (*)    All other components within normal limits  COMPREHENSIVE METABOLIC PANEL - Abnormal; Notable for the following components:   Potassium 3.4 (*)    Chloride 96 (*)    Glucose, Bld 108 (*)    Albumin 2.2 (*)    All other components within normal limits  URINALYSIS, ROUTINE W REFLEX MICROSCOPIC -  Abnormal; Notable for the following components:   Specific Gravity, Urine <1.005 (*)    Hgb urine dipstick LARGE (*)    Protein, ur 30 (*)    Leukocytes,Ua LARGE (*)    All other components within normal limits  URINALYSIS, MICROSCOPIC (REFLEX) - Abnormal; Notable for the following components:   Bacteria, UA RARE (*)    All other components within normal limits  RESP PANEL BY RT-PCR (RSV, FLU A&B, COVID)  [id number]  CULTURE, BLOOD (ROUTINE X 2)  CULTURE, BLOOD (ROUTINE X 2)  URINE CULTURE  LACTIC ACID, PLASMA    EKG None  Radiology DG Chest Portable 1 View Result Date: 08/23/2023 CLINICAL DATA:  Fever EXAM: PORTABLE CHEST 1 VIEW COMPARISON:  03/26/2023 FINDINGS: Patient is rotated. Stable heart size. Minimal left basilar atelectasis. Lungs are otherwise clear. No pleural effusion or pneumothorax. IMPRESSION: Minimal left basilar atelectasis. Electronically Signed   By: Duanne Guess D.O.   On: 08/23/2023 12:29    Procedures Procedures    Medications Ordered in ED Medications  lactated ringers bolus 1,000 mL (has no administration in time range)    ED Course/ Medical Decision Making/ A&P                                 Medical Decision Making Patient is nonverbal at baseline here from nursing home, brought in for evaluation of fever and lethargy. I spoke with patient's caregiver at the facility, was noted to be clammy feeling this morning mildly tachycardic and fever of 100.6 orally.  Sent in for evaluation for possible sepsis.   Differential would include but not limited to sepsis, pneumonia, viral process, urinary tract infection.,  Abscess or cellulitis  Amount and/or Complexity of Data Reviewed Labs: ordered.    Details: Labs no evidence of leukocytosis, lactic acid unremarkable.  Chemistries without significant derangement, respiratory panel is negative.  Her urinalysis shows large amount of blood large leukocytes with 21-50 white cells and rare bacteria.  Urine  culture and blood cultures are pending. Radiology: ordered.    Details: Chest x-ray minimal left basilar atelectasis ECG/medicine tests: ordered.    Details: EKG shows sinus tachycardia Discussion of management or test interpretation with external provider(s):   Workup here without evidence of sepsis.  Likely urinary tract infection.  She was given IV Rocephin here vital signs reassuring.  Will be sent back to the facility with a prescription for Keflex.  Spoke with caregiver at the facility and gave report.  Patient to follow-up with PCP in 1 week for recheck  Risk OTC drugs. Prescription drug management.           Final Clinical Impression(s) / ED Diagnoses Final diagnoses:  Acute cystitis with hematuria    Rx / DC Orders ED Discharge Orders     None         Pauline Aus, PA-C 08/23/23 1528    Kommor, Wyn Forster, MD 08/23/23 2026

## 2023-08-23 NOTE — ED Triage Notes (Signed)
 EMS called out for fever and tachycardia new onset this morning per facility. Pt is nonverbal but according to facility pt is lethargic. Pt and resting comfortably during triage.

## 2023-08-24 ENCOUNTER — Encounter (HOSPITAL_COMMUNITY): Payer: Self-pay

## 2023-08-24 ENCOUNTER — Other Ambulatory Visit: Payer: Self-pay

## 2023-08-24 ENCOUNTER — Inpatient Hospital Stay (HOSPITAL_COMMUNITY)
Admission: EM | Admit: 2023-08-24 | Discharge: 2023-08-29 | DRG: 872 | Disposition: A | Discharge status: Skilled Nursing Facility | Source: Skilled Nursing Facility | Attending: Internal Medicine | Admitting: Internal Medicine

## 2023-08-24 DIAGNOSIS — N3001 Acute cystitis with hematuria: Secondary | ICD-10-CM | POA: Diagnosis present

## 2023-08-24 DIAGNOSIS — K219 Gastro-esophageal reflux disease without esophagitis: Secondary | ICD-10-CM | POA: Diagnosis present

## 2023-08-24 DIAGNOSIS — R030 Elevated blood-pressure reading, without diagnosis of hypertension: Secondary | ICD-10-CM | POA: Diagnosis not present

## 2023-08-24 DIAGNOSIS — Z993 Dependence on wheelchair: Secondary | ICD-10-CM | POA: Diagnosis not present

## 2023-08-24 DIAGNOSIS — Z885 Allergy status to narcotic agent status: Secondary | ICD-10-CM

## 2023-08-24 DIAGNOSIS — I69391 Dysphagia following cerebral infarction: Secondary | ICD-10-CM | POA: Diagnosis not present

## 2023-08-24 DIAGNOSIS — Z79899 Other long term (current) drug therapy: Secondary | ICD-10-CM | POA: Diagnosis not present

## 2023-08-24 DIAGNOSIS — B957 Other staphylococcus as the cause of diseases classified elsewhere: Secondary | ICD-10-CM | POA: Diagnosis present

## 2023-08-24 DIAGNOSIS — B9620 Unspecified Escherichia coli [E. coli] as the cause of diseases classified elsewhere: Secondary | ICD-10-CM | POA: Diagnosis present

## 2023-08-24 DIAGNOSIS — I10 Essential (primary) hypertension: Secondary | ICD-10-CM | POA: Diagnosis present

## 2023-08-24 DIAGNOSIS — R000 Tachycardia, unspecified: Secondary | ICD-10-CM | POA: Diagnosis not present

## 2023-08-24 DIAGNOSIS — Z931 Gastrostomy status: Secondary | ICD-10-CM | POA: Diagnosis not present

## 2023-08-24 DIAGNOSIS — Z86711 Personal history of pulmonary embolism: Secondary | ICD-10-CM

## 2023-08-24 DIAGNOSIS — Z1152 Encounter for screening for COVID-19: Secondary | ICD-10-CM | POA: Diagnosis not present

## 2023-08-24 DIAGNOSIS — R7881 Bacteremia: Secondary | ICD-10-CM | POA: Diagnosis present

## 2023-08-24 DIAGNOSIS — G40909 Epilepsy, unspecified, not intractable, without status epilepticus: Secondary | ICD-10-CM | POA: Diagnosis present

## 2023-08-24 DIAGNOSIS — Z1612 Extended spectrum beta lactamase (ESBL) resistance: Secondary | ICD-10-CM | POA: Diagnosis present

## 2023-08-24 LAB — BASIC METABOLIC PANEL
Anion gap: 8 (ref 5–15)
BUN: 16 mg/dL (ref 8–23)
CO2: 28 mmol/L (ref 22–32)
Calcium: 10.2 mg/dL (ref 8.9–10.3)
Chloride: 101 mmol/L (ref 98–111)
Creatinine, Ser: 0.52 mg/dL (ref 0.44–1.00)
GFR, Estimated: >60 mL/min (ref >60)
Glucose, Bld: 125 mg/dL — ABNORMAL HIGH (ref 70–99)
Potassium: 3.6 mmol/L (ref 3.5–5.1)
Sodium: 137 mmol/L (ref 135–145)

## 2023-08-24 LAB — BLOOD CULTURE ID PANEL (REFLEXED) - BCID2

## 2023-08-24 LAB — CBC WITH DIFFERENTIAL/PLATELET
Abs Immature Granulocytes: 0.03 10*3/uL (ref 0.00–0.07)
Basophils Absolute: 0 10*3/uL (ref 0.0–0.1)
Basophils Relative: 0 %
Eosinophils Absolute: 0.1 10*3/uL (ref 0.0–0.5)
Eosinophils Relative: 1 %
HCT: 35 % — ABNORMAL LOW (ref 36.0–46.0)
Hemoglobin: 10.8 g/dL — ABNORMAL LOW (ref 12.0–15.0)
Immature Granulocytes: 1 %
Lymphocytes Relative: 19 %
Lymphs Abs: 1.2 10*3/uL (ref 0.7–4.0)
MCH: 29.1 pg (ref 26.0–34.0)
MCHC: 30.9 g/dL (ref 30.0–36.0)
MCV: 94.3 fL (ref 80.0–100.0)
Monocytes Absolute: 0.8 10*3/uL (ref 0.1–1.0)
Monocytes Relative: 13 %
Neutro Abs: 4.2 10*3/uL (ref 1.7–7.7)
Neutrophils Relative %: 66 %
Platelets: 270 10*3/uL (ref 150–400)
RBC: 3.71 MIL/uL — ABNORMAL LOW (ref 3.87–5.11)
RDW: 14.3 % (ref 11.5–15.5)
Smear Review: ADEQUATE
WBC: 6.3 10*3/uL (ref 4.0–10.5)
nRBC: 0 % (ref 0.0–0.2)

## 2023-08-24 MED ORDER — PROCHLORPERAZINE EDISYLATE 10 MG/2ML IJ SOLN: 10.0000 mg | Freq: Four times a day (QID) | INTRAMUSCULAR | Status: DC | PRN

## 2023-08-24 MED ORDER — VANCOMYCIN HCL 1250 MG/250ML IV SOLN
1250.0000 mg | INTRAVENOUS | Status: DC
  Administered 2023-08-25 – 2023-08-29 (×5): 1250 mg via INTRAVENOUS
  Filled 2023-08-24 (×5): qty 250

## 2023-08-24 MED ORDER — ACETAMINOPHEN 325 MG PO TABS
650.0000 mg | ORAL_TABLET | Freq: Four times a day (QID) | ORAL | Status: DC | PRN
  Administered 2023-08-25: 650 mg via ORAL
  Filled 2023-08-24: qty 2

## 2023-08-24 MED ORDER — LORAZEPAM 1 MG PO TABS
1.0000 mg | ORAL_TABLET | Freq: Three times a day (TID) | ORAL | Status: DC
Start: 2023-08-24 — End: 2023-08-29
  Administered 2023-08-24 – 2023-08-29 (×14): 1 mg
  Filled 2023-08-24 (×14): qty 1

## 2023-08-24 MED ORDER — FREE WATER
30.0000 mL | Status: DC
Start: 2023-08-24 — End: 2023-08-25

## 2023-08-24 MED ORDER — LACOSAMIDE 10 MG/ML PO SOLN
ORAL | Status: AC
  Filled 2023-08-24: qty 10

## 2023-08-24 MED ORDER — ISOSOURCE 1.5 CAL PO LIQD
237.0000 mL | Freq: Every day | ORAL | Status: DC
  Filled 2023-08-24: qty 1

## 2023-08-24 MED ORDER — ACETAMINOPHEN 650 MG RE SUPP: 650.0000 mg | Freq: Four times a day (QID) | RECTAL | Status: DC | PRN

## 2023-08-24 MED ORDER — ADULT MULTIVITAMIN W/MINERALS CH
1.0000 | ORAL_TABLET | Freq: Every day | ORAL | Status: DC
  Filled 2023-08-24: qty 1

## 2023-08-24 MED ORDER — VALPROIC ACID 250 MG/5ML PO SOLN
750.0000 mg | Freq: Three times a day (TID) | ORAL | Status: DC
  Administered 2023-08-24 – 2023-08-29 (×14): 750 mg
  Filled 2023-08-24 (×19): qty 15

## 2023-08-24 MED ORDER — GLYCOPYRROLATE 1 MG PO TABS: 1.0000 mg | ORAL_TABLET | Freq: Three times a day (TID) | ORAL | Status: DC | PRN

## 2023-08-24 MED ORDER — LACOSAMIDE 10 MG/ML PO SOLN
100.0000 mg | Freq: Two times a day (BID) | ORAL | Status: DC
  Administered 2023-08-24 – 2023-08-25 (×2): 100 mg via ORAL
  Filled 2023-08-24 (×5): qty 10

## 2023-08-24 MED ORDER — HYDROXYZINE HCL 25 MG PO TABS
25.0000 mg | ORAL_TABLET | Freq: Four times a day (QID) | ORAL | Status: DC | PRN
  Administered 2023-08-26: 25 mg
  Filled 2023-08-24: qty 1

## 2023-08-24 MED ORDER — SODIUM CHLORIDE 0.9 % IV SOLN: INTRAVENOUS | Status: AC

## 2023-08-24 MED ORDER — SODIUM CHLORIDE 0.9 % IV SOLN
1.0000 g | INTRAVENOUS | Status: DC
  Administered 2023-08-24 – 2023-08-28 (×5): 1 g via INTRAVENOUS
  Filled 2023-08-24: qty 1000
  Filled 2023-08-24: qty 1
  Filled 2023-08-24 (×6): qty 1000

## 2023-08-24 MED ORDER — ACETAMINOPHEN 325 MG PO TABS
650.0000 mg | ORAL_TABLET | Freq: Once | ORAL | Status: AC
  Administered 2023-08-24: 650 mg via ORAL
  Filled 2023-08-24: qty 2

## 2023-08-24 MED ORDER — BANATROL TF EN LIQD
60.0000 mL | Freq: Three times a day (TID) | ENTERAL | Status: DC
  Administered 2023-08-24 – 2023-08-25 (×3): 60 mL
  Filled 2023-08-24 (×3): qty 60

## 2023-08-24 MED ORDER — POLYETHYLENE GLYCOL 3350 17 G PO PACK
17.0000 g | PACK | Freq: Two times a day (BID) | ORAL | Status: DC
  Administered 2023-08-24: 17 g via ORAL
  Filled 2023-08-24 (×2): qty 1

## 2023-08-24 MED ORDER — VANCOMYCIN HCL IN DEXTROSE 1-5 GM/200ML-% IV SOLN: 1000.0000 mg | Freq: Once | INTRAVENOUS | Status: DC

## 2023-08-24 MED ORDER — TRAZODONE HCL 50 MG PO TABS: 75.0000 mg | ORAL_TABLET | Freq: Every day | ORAL | Status: DC

## 2023-08-24 MED ORDER — VANCOMYCIN HCL 1750 MG/350ML IV SOLN
1750.0000 mg | Freq: Once | INTRAVENOUS | Status: AC
  Administered 2023-08-24: 1750 mg via INTRAVENOUS
  Filled 2023-08-24: qty 350

## 2023-08-24 MED ORDER — ENOXAPARIN SODIUM 40 MG/0.4ML IJ SOSY
40.0000 mg | PREFILLED_SYRINGE | INTRAMUSCULAR | Status: DC
  Administered 2023-08-24 – 2023-08-28 (×5): 40 mg via SUBCUTANEOUS
  Filled 2023-08-24 (×5): qty 0.4

## 2023-08-24 MED ORDER — TIZANIDINE HCL 2 MG PO TABS: 2.0000 mg | ORAL_TABLET | Freq: Three times a day (TID) | ORAL | Status: DC | PRN

## 2023-08-24 MED ORDER — SODIUM CHLORIDE 0.9 % IV SOLN
1.0000 g | INTRAVENOUS | Status: DC
  Administered 2023-08-24: 1 g via INTRAVENOUS
  Filled 2023-08-24: qty 10

## 2023-08-24 MED ORDER — FAMOTIDINE 20 MG PO TABS
20.0000 mg | ORAL_TABLET | Freq: Two times a day (BID) | ORAL | Status: DC
  Administered 2023-08-24: 20 mg via ORAL
  Filled 2023-08-24: qty 1

## 2023-08-24 NOTE — Progress Notes (Signed)
 Pharmacy Antibiotic Note  Denise Savage is a 63 y.o. female admitted on 08/24/2023 with bacteremia.  Pharmacy has been consulted for vancomycin and ertapenem dosing.  Plan: Vancomycin 1750 mg IV x 1 dose followed by vancomycin 1250 IV every 24 hours Ertapenem 1 gm IV every 24 hours based on ID recs pending identification of GNR Monitor labs, c/s, and vanco levels as indicated  Height: 5\' 3"  (160 cm) Weight: 87.6 kg (193 lb 2 oz) IBW/kg (Calculated) : 52.4  Temp (24hrs), Avg:99.8 F (37.7 C), Min:99.3 F (37.4 C), Max:101 F (38.3 C)  Recent Labs  Lab 08/23/23 1035 08/24/23 1327  WBC 7.4 6.3  CREATININE 0.59 0.52  LATICACIDVEN 1.9  --     Estimated Creatinine Clearance: 75.6 mL/min (by C-G formula based on SCr of 0.52 mg/dL).    Allergies  Allergen Reactions   Hydrocodone Nausea Only   Antimicrobials this admission: CTX 3/12 >> 3/13  Vanco 3/13 >> Ertapenem 3/13 >>  Microbiology results: 3/13 Bcx: pending 3/12 BCx: gram + cocci 3/4 bottles BCID: stap epi 3/12 UCx: pending   Thank you for allowing pharmacy to be a part of this patient's care.  Littie Deeds, PharmD Pharmacy Resident  08/24/2023 7:59 PM

## 2023-08-24 NOTE — ED Triage Notes (Signed)
 [facility] contacted earlier d/t pt having abnormal blood culture results and EDP wanted sent back for more testing. Per facility and EMS no changes in pt's status since yesterday.

## 2023-08-24 NOTE — H&P (Signed)
 History and Physical    Patient: Denise Savage:[id number] DOB: 1960/07/18 DOA: 08/24/2023 DOS: the patient was seen and examined on 08/24/2023 PCP: Babs Sciara, MD  Patient coming from: SNF  Chief Complaint:  Chief Complaint  Patient presents with   abnormal labs   HPI: Denise Savage is a 63 y.o. 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  Patient apparently seen on 08/23/2023 secondary to low-grade temperature and concern for UTI.  Workup at that time in the ED demonstrated positive UTI but without any signs or symptoms for sepsis.  Patient treated with IV antibiotics and discharge back to her facility with oral Keflex to complete treatment.  Blood cultures taken at that time came back to be positive 2 out of 2 for positive MRSA staff epidermidis.  Facility has been contacted and patient has returned to the hospital for further evaluation and management.  Afebrile; nonverbal at baseline and unchanged.  No other acute complaints or acute distress appreciated on exam.  Repeat blood culture has been taken; IV vancomycin has been started.  Infectious disease service contacted.  Review of Systems: As mentioned in the history of present illness. All other systems reviewed and are negative. Past Medical History:  Diagnosis Date   Abdominal hernia    Anxiety    Depression    Dysphagia    G tube feedings (HCC)    G tube feedings (HCC)    Gastrostomy status (HCC)    GERD (gastroesophageal reflux disease)    Hemiplegia and hemiparesis following cerebral infarction affecting right dominant side (HCC)    Non-verbal learning disorder    Pulmonary embolus (HCC)    Seizure (HCC)    Seizures (HCC)    Stroke [facility])    Wheelchair dependent    Past Surgical History:  Procedure Laterality Date   CRANIOPLASTY Left  03/23/2021   Procedure: CRANIOPLASTY, HARVEST ABDOMINAL BONE FLAP;  Surgeon: Lisbeth Renshaw, MD;  Location: [facility] OR;  Service: Neurosurgery;  Laterality: Left;   CRANIOTOMY Left 06/19/2020   Procedure: CRANIOTOMY HEMATOMA EVACUATION SUBDURAL;  Surgeon: Jadene Pierini, MD;  Location: [facility] OR;  Service: Neurosurgery;  Laterality: Left;   ESOPHAGOGASTRODUODENOSCOPY N/A 07/01/2020   Procedure: ESOPHAGOGASTRODUODENOSCOPY (EGD);  Surgeon: Diamantina Monks, MD;  Location: [facility] ENDOSCOPY;  Service: General;  Laterality: N/A;   GASTROSTOMY  01/27/2022   IR ANGIO INTRA EXTRACRAN SEL INTERNAL CAROTID BILAT MOD SED  06/16/2020   IR ANGIO VERTEBRAL SEL VERTEBRAL UNI R MOD SED  06/16/2020   IR ANGIOGRAM FOLLOW UP STUDY  06/16/2020   IR ANGIOGRAM FOLLOW UP STUDY  06/16/2020   IR ANGIOGRAM FOLLOW UP STUDY  06/16/2020   IR CT HEAD LTD  06/16/2020   IR NEURO EACH ADD'L AFTER BASIC UNI RIGHT (MS)  06/16/2020   IR PATIENT EVAL TECH 0-60 MINS  01/24/2022   IR RADIOLOGIST EVAL & MGMT  01/24/2022   IR REPLC GASTRO/COLONIC TUBE PERCUT W/FLUORO  10/03/2022   IR TRANSCATH/EMBOLIZ  06/16/2020   IR US GUIDE VASC ACCESS RIGHT  06/16/2020   PEG PLACEMENT N/A 07/01/2020   Procedure: PERCUTANEOUS ENDOSCOPIC GASTROSTOMY (PEG) PLACEMENT;  Surgeon: Diamantina Monks, MD;  Location: [facility] ENDOSCOPY;  Service: General;  Laterality: N/A;   RADIOLOGY WITH ANESTHESIA N/A 06/16/2020   Procedure: IR WITH ANESTHESIA;  Surgeon: Lisbeth Renshaw, MD;  Location: [facility] OR;  Service: Radiology;  Laterality:  N/A;   TUBAL LIGATION     VENTRAL HERNIA REPAIR N/A 12/29/2014   Procedure: LAPAROSCOPIC VENTRAL HERNIA WITH MESH;  Surgeon: Franky Macho, MD;  Location: [facility] ORS;  Service: General;  Laterality: N/A;   Social History:  reports that she has never smoked. She has never used smokeless tobacco. She reports that she does not drink alcohol and does not use drugs.  Allergies  Allergen Reactions   Hydrocodone Nausea Only    Family History  Problem Relation Age of  Onset   Pancreatitis Neg Hx     Prior to Admission medications   Medication Sig Start Date End Date Taking? Authorizing Provider  acetaminophen (TYLENOL) 325 MG tablet Place 650 mg into feeding tube in the morning, at noon, in the evening, and at bedtime. (0000, 0600, 1200, 1800)   Yes [provider]  antiseptic oral rinse (CPC / CETYLPYRIDINIUM CHLORIDE 0.05%) 0.05 % LIQD solution 15 mLs by Mouth Rinse route in the morning, at noon, in the evening, and at bedtime. For mouth care use mouth swabs to clean out mouth   Yes [provider]  baclofen (FLEQSUVY) 25 MG/5ML SUSP oral suspension  06/09/23  Yes [provider]  cephALEXin (KEFLEX) 500 MG capsule Place 1 capsule (500 mg total) into feeding tube 4 (four) times daily. 08/23/23  Yes Triplett, Tammy, PA-C  cholecalciferol (VITAMIN D3) 25 MCG (1000 UNIT) tablet Take 1,000 Units by mouth daily.   Yes [provider]  Dextromethorphan-quiNIDine (NUEDEXTA) 20-10 MG capsule 1 capsule every 12 (twelve) hours. Via G-tube   Yes [provider]  famotidine (PEPCID) 20 MG tablet Take 20 mg by mouth 2 (two) times daily.   Yes [provider]  fiber supplement, BANATROL TF, liquid Place 60 mLs into feeding tube 3 (three) times daily. 02/03/23  Yes Sheikh, Omair Latif, DO  glycopyrrolate (ROBINUL) 1 MG tablet Take 1 mg by mouth every 8 (eight) hours as needed (increased secretions).   Yes [provider]  hydrOXYzine (ATARAX) 10 MG/5ML syrup Place 12.5 mLs (25 mg total) into feeding tube every 6 (six) hours as needed for anxiety. 02/03/23  Yes Sheikh, Omair Latif, DO  lacosamide (VIMPAT) 10 MG/ML oral solution Take 10 mLs by mouth 2 (two) times daily. 03/13/23  Yes [provider]  LORazepam (ATIVAN) 1 MG tablet Place 1 tablet (1 mg total) into feeding tube in the morning, at noon, in the evening, and at bedtime. (0000, 0600, 1200, 1800) 03/29/23  Yes Sherryll Burger, Pratik D, DO  medroxyPROGESTERone  (DEPO-PROVERA) 150 MG/ML injection Inject 1 mL (150 mg total) into the muscle every 3 (three) months. Patient taking differently: Inject 150 mg into the muscle every 3 (three) months. Starting on the 1st for 3 days for abnormal uterine bleeding 07/29/22 08/24/23 Yes Myna Hidalgo, DO  Melatonin 10 MG TABS Take 10 mg by mouth at bedtime. (2100)   Yes [provider]  Multiple Vitamin (MULTIVITAMIN WITH MINERALS) TABS tablet Take 1 tablet by mouth daily.   Yes [provider]  Nutritional Supplements (ISOSOURCE 1.5 CAL) LIQD Place 237 mLs into feeding tube 5 (five) times daily. (0600, 1000, 1400, 1800 & 2200)   Yes [provider]  polyethylene glycol (MIRALAX / GLYCOLAX) 17 g packet Take 17 g by mouth 2 (two) times daily.   Yes [provider]  tizanidine (ZANAFLEX) 2 MG capsule Take 2 mg by mouth 3 (three) times daily.   Yes [provider]  traZODone (DESYREL) 50 MG tablet  Take 75 mg by mouth at bedtime. (2200) 02/21/21  Yes [provider]  valproic acid (DEPAKENE) 250 MG/5ML solution Place 15 mLs (750 mg total) into feeding tube 3 (three) times daily. 02/03/23  Yes Sheikh, Omair Latif, DO  Water For Irrigation, Sterile (FREE WATER) SOLN Place 30-240 mLs into feeding tube See admin instructions. Flush feeding tube with 240 mls of water twice daily between feedings, flush with 60 ml of water before and after initiating feedings &  Flush peg tube with 30 cc of water before and after medication administration   Yes [provider]    Physical Exam: Vitals:   08/24/23 1300 08/24/23 1514 08/24/23 1545 08/24/23 1613  BP:   139/82 (!) 123/53  Pulse:   96 (!) 105  Resp:    16  Temp: 99.6 F (37.6 C)   99.3 F (37.4 C)  TempSrc: Rectal   Axillary  SpO2:  100% 97% 100%  Weight:      Height:       General exam: Alert, nontoxic in appearance and able to follow simple commands. Respiratory system: Good saturation on room air.  Good  saturation on room air. Cardiovascular system:RRR. No rubs or gallops. Gastrointestinal system: Abdomen is nondistended, soft and without guarding.  Gastrostomy tube in place with no surrounding erythema. Central nervous system: No new focal neurological deficits. Extremities: No cyanosis or clubbing. Skin: No petechiae. Psychiatry: Mood & affect appropriate.   Data Reviewed: CBC: WBC 6.3, hemoglobin 10.8 and platelet count 270K Basic metabolic panel: Sodium 137, potassium 3.6, chloride 101, bicarb 28, BUN 16, creatinine 0.52 and GFR >60   Assessment and Plan: 1-MRSA staph epidermidis bacteremia -ID service has been consulted and will follow recommendation -Continue empirical management with IV vancomycin -Follow repeat blood cultures -Continue supportive care. -Patient nonseptic on evaluation.  2-UTI -IV Rocephin-continue all inpatient -Follow culture results -Maintain adequate hydration.  3-history of seizure disorder -Continue seizure precautions and continue treatment with valproic acid and Vimpat.  4-GERD -Continue Pepcid.  5-history of stroke -Continue risk factor modification. -No acute neurologic deficits currently appreciated.  6-residual dysphagia -Continue tube feedings, water irrigation and medications through gastrostomy tube.    Advance Care Planning:   Code Status: Full Code   Consults: ID  Family Communication: No family at bedside.  Severity of Illness: The appropriate patient status for this patient is INPATIENT. Inpatient status is judged to be reasonable and necessary in order to provide the required intensity of service to ensure the patient's safety. The patient's presenting symptoms, physical exam findings, and initial radiographic and laboratory data in the context of their chronic comorbidities is felt to place them at high risk for further clinical deterioration. Furthermore, it is not anticipated that the patient will be medically stable for  discharge from the hospital within 2 midnights of admission.   * I certify that at the point of admission it is my clinical judgment that the patient will require inpatient hospital care spanning beyond 2 midnights from the point of admission due to high intensity of service, high risk for further deterioration and high frequency of surveillance required.*  Author: Vassie Loll, MD 08/24/2023 5:37 PM  For on call review [email or web address].

## 2023-08-24 NOTE — Plan of Care (Signed)

## 2023-08-24 NOTE — ED Notes (Signed)
 Pt is a hard stick, attempting to do and [place] IV.

## 2023-08-24 NOTE — Consult Note (Signed)
 [facility]                                                                                        Patient Identification: Patient Name: Denise Savage MRN: [id number] Admit Date: 08/24/2023 12:01 PM Today's Date: 08/24/2023 Reason for consult: Bacteremia/UTI Requesting provider: Dr Gwenlyn Perking  Note: Patient not seen in person ( video cart unavailable). Assessment and plan are based on chart review only  Principal Problem:   Bacteremia   Antibiotics:  Ceftriaxone 3/12- Vancomycin 3/13-c  Lines/Hardware: no known  Assessment # MRSE bacteremia -no clear source, no lines, infected wounds, hardware noted   # Possible UTI  - Urine cx with greater than 100k GNR, prior h/oi ESBL E coli   Recommendations  - continue Vancomycin, pharmacy to dose. Fu ID of GPC - 2 sets of repeat blood cx today  - Will change ceftriaxone to ertapenem pending GNR ID - Monitor CBC and CMP on abtx - d/w primary team  Rest of the management as per the primary team. Please call with questions or concerns.  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 She was thought to have UTI and was discharged on cephalexin after one dose of ceftriaxone.  At ED T max 101 Labs with unremarkable CBC and BMP UA with large leukocytes. Urine cx with GNR Blood cx 2/2 sets GPC, BCID with MRSE Chest Xray with minimal left basilar atelectasis    ROS: unavailable, remote chart review only  Past Medical History:  Diagnosis Date   Abdominal hernia    Anxiety    Depression    Dysphagia    G tube feedings  (HCC)    G tube feedings (HCC)    Gastrostomy status (HCC)    GERD (gastroesophageal reflux disease)    Hemiplegia and hemiparesis following cerebral infarction affecting right dominant side (HCC)    Non-verbal learning disorder    Pulmonary embolus (HCC)    Seizure (HCC)    Seizures (HCC)    Stroke [facility])    Wheelchair dependent    Past Surgical History:  Procedure Laterality Date   CRANIOPLASTY Left 03/23/2021   Procedure: CRANIOPLASTY, HARVEST ABDOMINAL BONE FLAP;  Surgeon: Lisbeth Renshaw, MD;  Location: [facility] OR;  Service: Neurosurgery;  Laterality: Left;   CRANIOTOMY Left 06/19/2020   Procedure: CRANIOTOMY HEMATOMA EVACUATION SUBDURAL;  Surgeon: Jadene Pierini, MD;  Location: [facility] OR;  Service: Neurosurgery;  Laterality: Left;   ESOPHAGOGASTRODUODENOSCOPY N/A 07/01/2020   Procedure: ESOPHAGOGASTRODUODENOSCOPY (EGD);  Surgeon: Diamantina Monks, MD;  Location: [facility] ENDOSCOPY;  Service: General;  Laterality: N/A;   GASTROSTOMY  01/27/2022   IR ANGIO INTRA EXTRACRAN SEL INTERNAL CAROTID BILAT MOD SED  06/16/2020   IR ANGIO VERTEBRAL SEL VERTEBRAL UNI R MOD SED  06/16/2020   IR ANGIOGRAM FOLLOW UP STUDY  06/16/2020   IR ANGIOGRAM FOLLOW UP STUDY  06/16/2020   IR ANGIOGRAM FOLLOW UP STUDY  06/16/2020   IR CT HEAD LTD  06/16/2020   IR NEURO EACH ADD'L AFTER BASIC UNI RIGHT (MS)  06/16/2020   IR PATIENT EVAL TECH 0-60 MINS  01/24/2022   IR RADIOLOGIST EVAL & MGMT  01/24/2022   IR REPLC GASTRO/COLONIC TUBE PERCUT W/FLUORO  10/03/2022   IR TRANSCATH/EMBOLIZ  06/16/2020   IR US GUIDE VASC ACCESS RIGHT  06/16/2020   PEG PLACEMENT N/A 07/01/2020   Procedure: PERCUTANEOUS ENDOSCOPIC GASTROSTOMY (PEG) PLACEMENT;  Surgeon: Diamantina Monks, MD;  Location: [facility] ENDOSCOPY;  Service: General;  Laterality: N/A;   RADIOLOGY WITH ANESTHESIA N/A 06/16/2020   Procedure: IR WITH ANESTHESIA;  Surgeon: Lisbeth Renshaw, MD;  Location: [facility] OR;  Service: Radiology;  Laterality: N/A;   TUBAL LIGATION     VENTRAL HERNIA REPAIR N/A  12/29/2014   Procedure: LAPAROSCOPIC VENTRAL HERNIA WITH MESH;  Surgeon: Franky Macho, MD;  Location: [facility] ORS;  Service: General;  Laterality: N/A;   Scheduled Meds:  enoxaparin (LOVENOX) injection  40 mg Subcutaneous Q24H   Continuous Infusions:  sodium chloride 40 mL/hr at 08/24/23 1700   cefTRIAXone (ROCEPHIN)  IV 1 g (08/24/23 1702)   [START ON 08/25/2023] vancomycin     PRN Meds:.acetaminophen **OR** acetaminophen, prochlorperazine  Allergies  Allergen Reactions   Hydrocodone Nausea Only   Social History   Socioeconomic History   Marital status: Single    Spouse name: Not on file   Number of children: Not on file   Years of education: Not on file   Highest education level: Not on file  Occupational History   Not on file  Tobacco Use   Smoking status: Never   Smokeless tobacco: Never  Vaping Use   Vaping status: Never Used  Substance and Sexual Activity   Alcohol use: No   Drug use: No   Sexual activity: Not Currently    Comment: tubal ligation  Other Topics Concern   Not on file  Social History Narrative   [employer]    Social Drivers of Health   Financial Resource Strain: Not on file  Food Insecurity: Patient Unable To Answer (08/24/2023)   Hunger Vital Sign    Worried About Running Out of Food in the Last Year: Patient unable to answer    Ran Out of Food in the Last Year: Patient unable to answer  Transportation Needs: Patient Unable To Answer (08/24/2023)   PRAPARE - Transportation    Lack of Transportation (Medical): Patient unable to answer    Lack of Transportation (Non-Medical): Patient unable to answer  Physical Activity: Not on file  Stress: Not on file  Social Connections: Patient Unable To Answer (08/24/2023)   Social Connection and Isolation Panel [NHANES]    Frequency of Communication with Friends and Family: Patient unable to answer    Frequency of Social Gatherings with Friends and Family: Patient unable to answer    Attends  Religious Services: Patient unable to answer    Active Member of Clubs or Organizations: Patient unable to answer    Attends Banker Meetings: Patient unable to answer    Marital Status: Patient unable to answer  Intimate Partner Violence: Patient Unable To Answer (08/24/2023)   Humiliation, Afraid, Rape, and Kick questionnaire    Fear of Current or Ex-Partner: Patient unable to answer    Emotionally Abused: Patient unable to answer    Physically Abused: Patient unable to answer    Sexually Abused: Patient  unable to answer   Family History  Problem Relation Age of Onset   Pancreatitis Neg Hx    Vitals BP (!) 123/53 (BP Location: Right Arm)   Pulse (!) 105   Temp 99.3 F (37.4 C) (Axillary)   Resp 16   Ht 5\' 3"  (1.6 m)   Wt 87.6 kg   SpO2 100%   BMI 34.21 kg/m    Physical Exam Remote consult   Pertinent Microbiology Results for orders placed or performed during the hospital encounter of 08/23/23  Urine Culture     Status: Abnormal (Preliminary result)   Collection Time: 08/23/23 10:19 AM   Specimen: Urine, Catheterized  Result Value Ref Range Status   Specimen Description   Final    URINE, CATHETERIZED Performed at [facility], [address] [place], [place] [address]    Special Requests   Final    NONE Performed at [facility], [address], [place], [place] [address]    Culture (A)  Final    >=100,000 COLONIES/mL GRAM NEGATIVE RODS IDENTIFICATION AND SUSCEPTIBILITIES TO FOLLOW Performed at [facility], [address], [place], [place] [address]    Report Status PENDING  Incomplete  Resp panel by RT-PCR (RSV, Flu A&B, Covid) Anterior Nasal Swab     Status: None   Collection Time: 08/23/23 10:35 AM   Specimen: Anterior Nasal Swab  Result Value Ref Range Status   SARS Coronavirus 2 by RT PCR NEGATIVE NEGATIVE Final    Comment: (NOTE) SARS-CoV-2 target nucleic acids are NOT DETECTED.  The SARS-CoV-2 RNA is generally detectable in upper  respiratory specimens during the acute phase of infection. The lowest concentration of SARS-CoV-2 viral copies this assay can detect is 138 copies/mL. A negative result does not preclude SARS-Cov-2 infection and should not be used as the sole basis for treatment or other patient management decisions. A negative result may occur with  improper specimen collection/handling, submission of specimen other than nasopharyngeal swab, presence of viral mutation(s) within the areas targeted by this assay, and inadequate number of viral copies(<138 copies/mL). A negative result must be combined with clinical observations, patient history, and epidemiological information. The expected result is Negative.  Fact Sheet for Patients:  BloggerCourse.com  Fact Sheet for Healthcare Providers:  SeriousBroker.it  This test is no t yet approved or cleared by the [place] FDA and  has been authorized for detection and/or diagnosis of SARS-CoV-2 by FDA under an Emergency Use Authorization (EUA). This EUA will remain  in effect (meaning this test can be used) for the duration of the COVID-19 declaration under Section 564(b)(1) of the Act, 21 U.S.C.section 360bbb-3(b)(1), unless the authorization is terminated  or revoked sooner.       Influenza A by PCR NEGATIVE NEGATIVE Final   Influenza B by PCR NEGATIVE NEGATIVE Final    Comment: (NOTE) The Xpert Xpress SARS-CoV-2/FLU/RSV plus assay is intended as an aid in the diagnosis of influenza from Nasopharyngeal swab specimens and should not be used as a sole basis for treatment. Nasal washings and aspirates are unacceptable for Xpert Xpress SARS-CoV-2/FLU/RSV testing.  Fact Sheet for Patients: BloggerCourse.com  Fact Sheet for Healthcare Providers: SeriousBroker.it  This test is not yet approved or cleared by the [place] FDA and has been  authorized for detection and/or diagnosis of SARS-CoV-2 by FDA under an Emergency Use Authorization (EUA). This EUA will remain in effect (meaning this test can be used) for the duration of the COVID-19 declaration under Section 564(b)(1)  of the Act, 21 U.S.C. section 360bbb-3(b)(1), unless the authorization is terminated or revoked.     Resp Syncytial Virus by PCR NEGATIVE NEGATIVE Final    Comment: (NOTE) Fact Sheet for Patients: BloggerCourse.com  Fact Sheet for Healthcare Providers: SeriousBroker.it  This test is not yet approved or cleared by the [place] FDA and has been authorized for detection and/or diagnosis of SARS-CoV-2 by FDA under an Emergency Use Authorization (EUA). This EUA will remain in effect (meaning this test can be used) for the duration of the COVID-19 declaration under Section 564(b)(1) of the Act, 21 U.S.C. section 360bbb-3(b)(1), unless the authorization is terminated or revoked.  Performed at [facility], [address], [place], [place] [address]   Culture, blood (routine x 2)     Status: None (Preliminary result)   Collection Time: 08/23/23 10:42 AM   Specimen: BLOOD  Result Value Ref Range Status   Specimen Description   Final    BLOOD RIGHT ANTECUBITAL Performed at [facility], [address], [place], [place] [address]    Special Requests   Final    BOTTLES DRAWN AEROBIC AND ANAEROBIC Blood Culture adequate volume Performed at [facility], [address], [place], [place] [address]    Culture  Setup Time   Final    GRAM POSITIVE COCCI IN BOTH AEROBIC AND ANAEROBIC BOTTLES PREV CALLED K. NICHOLS [id number], VIRAY,J CRITICAL VALUE NOTED.  VALUE IS CONSISTENT WITH PREVIOUSLY REPORTED AND CALLED VALUE. A. SNYDER 08/1623 0859 GRAM STAIN REVIEWED-AGREE WITH RESULT DRT Performed at [facility], [address] [place], [place] [address]    Culture GRAM POSITIVE COCCI  Final   Report  Status PENDING  Incomplete  Culture, blood (routine x 2)     Status: None (Preliminary result)   Collection Time: 08/23/23 10:52 AM   Specimen: BLOOD  Result Value Ref Range Status   Specimen Description   Final    BLOOD LEFT ANTECUBITAL Performed at [facility], [address], [place], [place] [address]    Special Requests   Final    BOTTLES DRAWN AEROBIC AND ANAEROBIC Blood Culture adequate volume Performed at [facility], [address], [place], [place] [address]    Culture  Setup Time   Final    GRAM POSITIVE COCCI ANAEROBIC BOTTLE ONLY Gram Stain Report Called to,Read Back By and Verified With: Patrcia Dolly, VIRAY,J Organism ID to follow CRITICAL RESULT CALLED TO, READ BACK BY AND VERIFIED WITH: RN Dinah Beers on [phone] @1044  by SM Performed at [facility], [address], [place], [place] [address]    Culture GRAM POSITIVE COCCI  Final   Report Status PENDING  Incomplete  Blood Culture ID Panel (Reflexed)     Status: Abnormal   Collection Time: 08/23/23 10:52 AM  Result Value Ref Range Status   Enterococcus faecalis NOT DETECTED NOT DETECTED Final   Enterococcus Faecium NOT DETECTED NOT DETECTED Final   Listeria monocytogenes NOT DETECTED NOT DETECTED Final   Staphylococcus species DETECTED (A) NOT DETECTED Final    Comment: CRITICAL RESULT CALLED TO, READ BACK BY AND VERIFIED WITH: RN DEANNI on [phone] @1044  by SM    Staphylococcus aureus (BCID) NOT DETECTED NOT DETECTED Final   Staphylococcus epidermidis DETECTED (A) NOT DETECTED Final    Comment: Methicillin (oxacillin) resistant coagulase negative staphylococcus. Possible blood culture contaminant (unless isolated from more than one blood culture draw or clinical case suggests pathogenicity). No antibiotic treatment is indicated for blood  culture contaminants. CRITICAL RESULT CALLED TO, READ BACK BY AND VERIFIED WITH: RN DEANNI on [phone] @1044  by SM    Staphylococcus lugdunensis NOT DETECTED NOT  DETECTED Final   Streptococcus species NOT DETECTED NOT DETECTED Final   Streptococcus agalactiae NOT DETECTED NOT DETECTED Final   Streptococcus pneumoniae NOT DETECTED NOT DETECTED Final   Streptococcus pyogenes NOT DETECTED NOT DETECTED Final   A.calcoaceticus-baumannii NOT DETECTED NOT DETECTED Final   Bacteroides fragilis NOT DETECTED NOT DETECTED Final   Enterobacterales NOT DETECTED NOT DETECTED Final   Enterobacter cloacae complex NOT DETECTED NOT DETECTED Final   Escherichia coli NOT DETECTED NOT DETECTED Final   Klebsiella aerogenes NOT DETECTED NOT DETECTED Final   Klebsiella oxytoca NOT DETECTED NOT DETECTED Final   Klebsiella pneumoniae NOT DETECTED NOT DETECTED Final   Proteus species NOT DETECTED NOT DETECTED Final   Salmonella species NOT DETECTED NOT DETECTED Final   Serratia marcescens NOT DETECTED NOT DETECTED Final   Haemophilus influenzae NOT DETECTED NOT DETECTED Final   Neisseria meningitidis NOT DETECTED NOT DETECTED Final   Pseudomonas aeruginosa NOT DETECTED NOT DETECTED Final   Stenotrophomonas maltophilia NOT DETECTED NOT DETECTED Final   Candida albicans NOT DETECTED NOT DETECTED Final   Candida auris NOT DETECTED NOT DETECTED Final   Candida glabrata NOT DETECTED NOT DETECTED Final   Candida krusei NOT DETECTED NOT DETECTED Final   Candida parapsilosis NOT DETECTED NOT DETECTED Final   Candida tropicalis NOT DETECTED NOT DETECTED Final   Cryptococcus neoformans/gattii NOT DETECTED NOT DETECTED Final   Methicillin resistance mecA/C DETECTED (A) NOT DETECTED Final    Comment: CRITICAL RESULT CALLED TO, READ BACK BY AND VERIFIED WITH: RN Dinah Beers on [phone] @1044  by SM Performed at [facility], [address] [place], [place] [address]    Pertinent Lab seen by me:    Latest Ref Rng & Units 08/24/2023    1:27 PM 08/23/2023   10:35 AM 03/29/2023    4:16 AM  CBC  WBC 4.0 - 10.5 K/uL 6.3  7.4  7.2   Hemoglobin 12.0 - 15.0 g/dL 47.8  29.5  62.1    Hematocrit 36.0 - 46.0 % 35.0  35.9  34.4   Platelets 150 - 400 K/uL 270  335  151       Latest Ref Rng & Units 08/24/2023    1:27 PM 08/23/2023   10:35 AM 03/29/2023    4:16 AM  CMP  Glucose 70 - 99 mg/dL 308  657  846   BUN 8 - 23 mg/dL 16  15  22    Creatinine 0.44 - 1.00 mg/dL 9.62  9.52  8.41   Sodium 135 - 145 mmol/L 137  135  139   Potassium 3.5 - 5.1 mmol/L 3.6  3.4  3.5   Chloride 98 - 111 mmol/L 101  96  105   CO2 22 - 32 mmol/L 28  29  25    Calcium 8.9 - 10.3 mg/dL 32.4  40.1  9.8   Total Protein 6.5 - 8.1 g/dL  7.1    Total Bilirubin 0.0 - 1.2 mg/dL  0.2    Alkaline Phos 38 - 126 U/L  62    AST 15 - 41 U/L  22    ALT 0 - 44 U/L  10      Pertinent Imagings/Other Imagings Plain films and CT images have been personally visualized and interpreted; radiology reports have been reviewed. Decision making incorporated into the Impression / Recommendations.  DG Chest Portable 1 View Result Date: 08/23/2023 CLINICAL DATA:  Fever EXAM: PORTABLE CHEST 1 VIEW COMPARISON:  03/26/2023 FINDINGS: Patient is rotated. Stable heart size. Minimal left basilar atelectasis. Lungs are otherwise clear. No pleural effusion or pneumothorax. IMPRESSION: Minimal left basilar atelectasis. Electronically Signed   By: Duanne Guess D.O.   On: 08/23/2023 12:29   I have personally spent 85 minutes involved in face-to-face and non-face-to-face activities for this patient on the day of the visit. Professional time spent includes the following activities: Preparing to see the patient (review of tests), Obtaining and/or reviewing separately obtained history (admission/discharge record), Performing a medically appropriate examination and/or evaluation , Ordering medications/tests/procedures, referring and communicating with other health care professionals, Documenting clinical information in the EMR, Independently interpreting results (not separately reported), Communicating results to the  patient/family/caregiver, Counseling and educating the patient/family/caregiver and Care coordination (not separately reported).  Electronically signed by:   Plan d/w requesting provider as well as ID pharm D  Of note, portions of this note may have been created with voice recognition software. While this note has been edited for accuracy, occasional wrong-word or 'sound-a-like' substitutions may have occurred due to the inherent limitations of voice recognition software.   Odette Fraction, MD Infectious Disease Physician [facility] Pager: [phone]

## 2023-08-24 NOTE — Progress Notes (Signed)
 Pharmacy Antibiotic Note  Denise Savage is a 63 y.o. female admitted on 08/24/2023 with bacteremia.  Pharmacy has been consulted for vancomycin dosing.  Plan: Vancomycin 1750 mg IV x 1 dose. Vancomycin 1250 IV every 24 hours. Monitor labs, c/s, and vanco levels as indicated.  Height: 5\' 3"  (160 cm) Weight: 87.6 kg (193 lb 2 oz) IBW/kg (Calculated) : 52.4  Temp (24hrs), Avg:100 F (37.8 C), Min:99.4 F (37.4 C), Max:101 F (38.3 C)  Recent Labs  Lab 08/23/23 1035 08/24/23 1327  WBC 7.4 6.3  CREATININE 0.59 0.52  LATICACIDVEN 1.9  --     Estimated Creatinine Clearance: 75.6 mL/min (by C-G formula based on SCr of 0.52 mg/dL).    Allergies  Allergen Reactions   Hydrocodone Nausea Only    Antimicrobials this admission: Vanco 3/13 >> CTX 3/12 (discharge on keflex for UTI)   Microbiology results: 3/13 Bcx: pending 3/12 BCx: gram + cocci 3/4 bottles BCID: stap epi 3/12 UCx: pending   Thank you for allowing pharmacy to be a part of this patient's care.  Judeth Cornfield, PharmD Clinical Pharmacist 08/24/2023 3:30 PM

## 2023-08-24 NOTE — ED Notes (Signed)
 [employer], report given to [employer]

## 2023-08-24 NOTE — Progress Notes (Signed)
   08/24/23 2140  TOC Assessment  TOC screening is complete Yes  Once discharged, how will the patient get to their discharge location? Ambulance  Expected Discharge Plan Skilled Nursing Facility  Final next level of care Skilled Nursing Facility  Barriers to Discharge Continued Medical Work up  Patient states their goals for this hospitalization and ongoing recovery are: Non-verbal at baseline.  Post Acute Care Choice Skilled Nursing Facility  Living arrangements for the past 2 months Skilled Nursing Facility  Lives with: Facility Resident  Criminal Activity/Legal Involvement Pertinent to Current Situation/Hospitalization No - Comment as needed  Need for Family Participation in Patient Care N  Psych Involvement N   Pt to return to [facility] [employer]. Transition of Care Department [facility]) will continue to monitor patient advancement through interdisciplinary progression rounds.

## 2023-08-24 NOTE — ED Provider Notes (Signed)
 [place] EMERGENCY DEPARTMENT AT [facility] Provider Note   CSN: [id number] Arrival date & time: 08/24/23  1201     History  Chief Complaint  Patient presents with   abnormal labs    Denise Savage is a 63 y.o. female.  HPI     Denise Savage is a 64 y.o. female who resides at [facility] and has past medical history of seizures, she is nonverbal at baseline, prior stroke, and has a gastrostomy tube.  She was seen here and evaluated by me yesterday for fever and lethargy.  Her workup yesterday showed likely urinary tract infection.  She was given IV Rocephin yesterday and blood cultures were obtained.  2 of the blood cultures from yesterday grew methicillin resistant gram-positive Staphylococcus epidermidis.  Facility was contacted to have patient return to the ER for treatment  Home Medications Prior to Admission medications   Medication Sig Start Date End Date Taking? Authorizing Provider  acetaminophen (TYLENOL) 325 MG tablet Place 650 mg into feeding tube in the morning, at noon, in the evening, and at bedtime. (0000, 0600, 1200, 1800)    [provider]  antiseptic oral rinse (CPC / CETYLPYRIDINIUM CHLORIDE 0.05%) 0.05 % LIQD solution 15 mLs by Mouth Rinse route in the morning, at noon, in the evening, and at bedtime. For mouth care use mouth swabs to clean out mouth    [provider]  baclofen (FLEQSUVY) 25 MG/5ML SUSP oral suspension  06/09/23   [provider]  cephALEXin (KEFLEX) 500 MG capsule Place 1 capsule (500 mg total) into feeding tube 4 (four) times daily. 08/23/23   Nyaira Hodgens, PA-C  cholecalciferol (VITAMIN D3) 25 MCG (1000 UNIT) tablet Take 1,000 Units by mouth daily.    [provider]  Dextromethorphan-quiNIDine (NUEDEXTA) 20-10 MG capsule 1 capsule every 12 (twelve) hours. Via G-tube    [provider]  famotidine (PEPCID) 20 MG tablet Take 20 mg by mouth 2 (two) times daily.    [provider]  fiber supplement, BANATROL TF, liquid Place 60 mLs into feeding tube 3 (three) times daily. 02/03/23   Marguerita Merles Latif, DO  glycopyrrolate (ROBINUL) 1 MG tablet Take 1 mg by mouth every 8 (eight) hours as needed (increased secretions).    [provider]  hydrOXYzine (ATARAX) 10 MG/5ML syrup Place 12.5 mLs (25 mg total) into feeding tube every 6 (six) hours as needed for anxiety. 02/03/23   Marguerita Merles Latif, DO  lacosamide (VIMPAT) 10 MG/ML oral solution Take 10 mLs by mouth 2 (two) times daily. 03/13/23   [provider]  LORazepam (ATIVAN) 1 MG tablet Place 1 tablet (1 mg total) into feeding tube in the morning, at noon, in the evening, and at bedtime. (0000, 0600, 1200, 1800) 03/29/23   Sherryll Burger, Pratik D, DO  medroxyPROGESTERone (DEPO-PROVERA) 150 MG/ML injection Inject 1 mL (150 mg total) into the muscle every 3 (three) months. Patient taking differently: Inject 150 mg into the muscle every 3 (three) months. Starting on the 1st for 3 days for abnormal uterine bleeding 07/29/22 08/23/23  Myna Hidalgo, DO  Melatonin 10 MG TABS Take 10 mg by mouth at bedtime. (2100)    [provider]  Multiple Vitamin (MULTIVITAMIN WITH MINERALS) TABS tablet Take 1 tablet by mouth daily.    [provider]  Nutritional Supplements (ISOSOURCE 1.5 CAL) LIQD Place 237 mLs into feeding tube 5 (five) times daily. (0600, 1000, 1400, 1800 & 2200)    [provider]  polyethylene glycol (MIRALAX / GLYCOLAX) 17 g packet Take 17 g by mouth 2 (two) times daily.    [provider]  tizanidine (ZANAFLEX) 2 MG capsule Take 2 mg by mouth 3 (three) times daily.    [provider]  traZODone (DESYREL) 50 MG tablet Take 75 mg by mouth at bedtime. (2200) 02/21/21   [provider]  valproic acid (DEPAKENE) 250 MG/5ML solution Place 15 mLs (750 mg total) into feeding tube 3 (three) times daily. 02/03/23   Marguerita Merles Latif, DO  Water For  Irrigation, Sterile (FREE WATER) SOLN Place 30-240 mLs into feeding tube See admin instructions. Flush feeding tube with 240 mls of water twice daily between feedings, flush with 60 ml of water before and after initiating feedings &  Flush peg tube with 30 cc of water before and after medication administration    [provider]      Allergies    Hydrocodone    Review of Systems   Review of Systems  Unable to perform ROS: Patient nonverbal    Physical Exam Updated Vital Signs BP (!) 152/89 (BP Location: Right Arm)   Pulse 94   Temp 99.4 F (37.4 C) (Axillary)   Resp 16   Ht 5\' 3"  (1.6 m)   Wt 87.6 kg   SpO2 98%   BMI 34.21 kg/m  Physical Exam Vitals and nursing note reviewed.  Constitutional:      General: She is not in acute distress.    Appearance: She is not ill-appearing or toxic-appearing.  Eyes:     Extraocular Movements: Extraocular movements intact.     Conjunctiva/sclera: Conjunctivae normal.     Pupils: Pupils are equal, round, and reactive to light.  Cardiovascular:     Rate and Rhythm: Normal rate and regular rhythm.     Pulses: Normal pulses.  Pulmonary:     Effort: Pulmonary effort is normal. No respiratory distress.     Breath sounds: No wheezing or rhonchi.  Abdominal:     Palpations: Abdomen is soft.     Tenderness: There is no abdominal tenderness.     Comments: Gastrostomy tube in place, no surrounding erythema.  Musculoskeletal:     Right lower leg: No edema.     Left lower leg: No edema.  Skin:    General: Skin is warm.     Capillary Refill: Capillary refill takes less than 2 seconds.  Neurological:     Mental Status: She is alert.     Comments: Patient nonverbal at baseline, appropriately withdraws from painful stimuli.     ED Results / Procedures / Treatments   Labs (all labs ordered are listed, but only abnormal results are displayed) Labs Reviewed  BASIC METABOLIC PANEL - Abnormal; Notable for the following components:       Result Value   Glucose, Bld 125 (*)    All other components within normal limits  CBC WITH DIFFERENTIAL/PLATELET - Abnormal; Notable for the following components:   RBC 3.71 (*)    Hemoglobin 10.8 (*)    HCT 35.0 (*)    All other components within normal limits  CULTURE, BLOOD (ROUTINE X 2)  CULTURE, BLOOD (ROUTINE X 2)  CBC WITH DIFFERENTIAL/PLATELET    EKG None  Radiology DG Chest Portable 1 View Result Date: 08/23/2023 CLINICAL DATA:  Fever EXAM: PORTABLE CHEST 1 VIEW COMPARISON:  03/26/2023 FINDINGS: Patient is rotated. Stable heart size. Minimal left basilar atelectasis. Lungs are otherwise clear. No pleural  effusion or pneumothorax. IMPRESSION: Minimal left basilar atelectasis. Electronically Signed   By: Duanne Guess D.O.   On: 08/23/2023 12:29    Procedures Procedures    Medications Ordered in ED Medications  vancomycin (VANCOREADY) IVPB 1750 mg/350 mL (has no administration in time range)    ED Course/ Medical Decision Making/ A&P                                 Medical Decision Making Patient here yesterday seen by me, evaluated for fever and lethargy per facility report.  Patient had low-grade fever yesterday.  No indication of sepsis.  Likely UTI, was given IV Rocephin here discharged back to the facility with cephalexin.  2 of her blood cultures from yesterday grew methicillin-resistant Staphylococcus epidermidis.  Facility was contacted to have patient return to the ER for treatment.  I have fully examined patient there is few scabbed areas between the toes of the left foot.  No open wounds or drainage.  Do not appreciate any excessive warmth or erythema.  No sacral wounds on exam.It is unclear at this time as the possible source  Of note, review of patient's medical records she was noted to have positive blood cultures in October that grew same species, but only one set, was treated with Zosyn and vancomycin.  Amount and/or Complexity of Data  Reviewed Labs: ordered.    Details: Labs today with no evidence of leukocytosis Discussion of management or test interpretation with external provider(s):   Labs and repeat blood cultures ordered today.  Initial vancomycin dosing ordered as well.  Patient to be admitted to hospital  I spoke with pharmacy here, vancomycin dosing adjusted by weight.  Pharmacist to also consult with ID pharmacy regarding ongoing treatment  Risk Prescription drug management. Decision regarding hospitalization.           Final Clinical Impression(s) / ED Diagnoses Final diagnoses:  Bacteremia    Rx / DC Orders ED Discharge Orders     None         Rosey Bath 08/24/23 1527    Rondel Baton, MD 08/25/23 1222

## 2023-08-25 DIAGNOSIS — R7881 Bacteremia: Secondary | ICD-10-CM | POA: Diagnosis not present

## 2023-08-25 LAB — HIV ANTIBODY (ROUTINE TESTING W REFLEX): HIV Screen 4th Generation wRfx: NONREACTIVE

## 2023-08-25 LAB — URINE CULTURE: Culture: >=100000 — AB

## 2023-08-25 LAB — CREATININE, SERUM
Creatinine, Ser: 0.48 mg/dL (ref 0.44–1.00)
GFR, Estimated: >60 mL/min (ref >60)

## 2023-08-25 LAB — GLUCOSE, CAPILLARY: Glucose-Capillary: 96 mg/dL (ref 70–99)

## 2023-08-25 MED ORDER — FREE WATER
60.0000 mL | Freq: Every day | Status: DC
  Administered 2023-08-25 – 2023-08-29 (×24): 60 mL

## 2023-08-25 MED ORDER — LABETALOL HCL 5 MG/ML IV SOLN
20.0000 mg | Freq: Once | INTRAVENOUS | Status: AC
  Administered 2023-08-25: 20 mg via INTRAVENOUS
  Filled 2023-08-25: qty 4

## 2023-08-25 MED ORDER — TIZANIDINE HCL 2 MG PO TABS
2.0000 mg | ORAL_TABLET | Freq: Three times a day (TID) | ORAL | Status: DC | PRN
Start: 2023-08-25 — End: 2023-08-29

## 2023-08-25 MED ORDER — ACETAMINOPHEN 325 MG PO TABS
650.0000 mg | ORAL_TABLET | Freq: Four times a day (QID) | ORAL | Status: DC | PRN
  Administered 2023-08-25 – 2023-08-27 (×4): 650 mg
  Filled 2023-08-25 (×4): qty 2

## 2023-08-25 MED ORDER — POLYETHYLENE GLYCOL 3350 17 G PO PACK
17.0000 g | PACK | Freq: Two times a day (BID) | ORAL | Status: DC
  Administered 2023-08-25 – 2023-08-27 (×6): 17 g
  Filled 2023-08-25 (×6): qty 1

## 2023-08-25 MED ORDER — TRAZODONE HCL 50 MG PO TABS
75.0000 mg | ORAL_TABLET | Freq: Every day | ORAL | Status: DC
  Administered 2023-08-25 – 2023-08-28 (×4): 75 mg
  Filled 2023-08-25 (×4): qty 2

## 2023-08-25 MED ORDER — ADULT MULTIVITAMIN W/MINERALS CH
1.0000 | ORAL_TABLET | Freq: Every day | ORAL | Status: DC
  Administered 2023-08-25 – 2023-08-29 (×5): 1
  Filled 2023-08-25 (×4): qty 1

## 2023-08-25 MED ORDER — ACETAMINOPHEN 650 MG RE SUPP: 650.0000 mg | Freq: Four times a day (QID) | RECTAL | Status: DC | PRN

## 2023-08-25 MED ORDER — GLYCOPYRROLATE 1 MG PO TABS
1.0000 mg | ORAL_TABLET | Freq: Three times a day (TID) | ORAL | Status: DC | PRN
  Administered 2023-08-26 – 2023-08-28 (×2): 1 mg
  Filled 2023-08-25 (×2): qty 1

## 2023-08-25 MED ORDER — JEVITY 1.2 CAL PO LIQD
237.0000 mL | Freq: Every day | ORAL | Status: DC
  Administered 2023-08-25 – 2023-08-29 (×23): 237 mL

## 2023-08-25 MED ORDER — LACOSAMIDE 10 MG/ML PO SOLN
100.0000 mg | Freq: Two times a day (BID) | ORAL | Status: DC
  Administered 2023-08-25 – 2023-08-29 (×8): 100 mg
  Filled 2023-08-25 (×8): qty 10

## 2023-08-25 MED ORDER — FAMOTIDINE 20 MG PO TABS
20.0000 mg | ORAL_TABLET | Freq: Two times a day (BID) | ORAL | Status: DC
  Administered 2023-08-25 – 2023-08-29 (×9): 20 mg
  Filled 2023-08-25 (×10): qty 1

## 2023-08-25 NOTE — Progress Notes (Signed)
  Progress Note   Patient: Denise Savage:[id number] DOB: 1961/04/23 DOA: 08/24/2023     1 DOS: the patient was seen and examined on 08/25/2023   Brief hospital admission narrative:  Denise Savage is a 63 y.o. 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  Patient apparently seen on 08/23/2023 secondary to low-grade temperature and concern for UTI.  Workup at that time in the ED demonstrated positive UTI but without any signs or symptoms for sepsis.  Patient treated with IV antibiotics and discharge back to her facility with oral Keflex to complete treatment.  Blood cultures taken at that time came back to be positive 2 out of 2 for positive MRSA staff epidermidis.  Facility has been contacted and patient has returned to the hospital for further evaluation and management.   Afebrile; nonverbal at baseline and unchanged.  No other acute complaints or acute distress appreciated on exam.   Repeat blood culture has been taken; IV vancomycin has been started.  Infectious disease service contacted.  Assessment and Plan: 1-MRSA staphylococcus epidermidis bacteremia -Continue to follow culture results -Continue IV vancomycin -Appreciate assistance and recommendation by ID service.  2-UTI -Patient with history of E. coli ESBL -Following ID recommendations we will continue IV antibiotics using ertapenem. -Follow culture results -Maintain adequate hydration -Continue supportive care.  3-seizure disorder -No active seizure appreciated -Continue seizure precaution -Continue treatment with valproic acid and Vimpat.  4-GERD -Continue Pepcid  5-history of stroke -Continue Refacto modification -No acute neurologic deficits currently appreciated.  6-residual dysphagia -Patient is s/p gastrostomy tube placement -Continue tube  feedings, water irrigation and medication through gastrostomy tube. -No signs of malfunctioning or displacement currently appreciated.    Subjective:  No febrile, no chest pain, no nausea or vomiting reported.  Patient is nonverbal at baseline.  No overnight events reported.  Physical Exam: Vitals:   08/24/23 2230 08/25/23 0128 08/25/23 0348 08/25/23 0358  BP: (!) 136/43 (!) 162/99  126/79  Pulse:  (!) 116 87 89  Resp: 18 18  18   Temp: 99.9 F (37.7 C) 99.7 F (37.6 C)  98.2 F (36.8 C)  TempSrc: Oral Oral  Oral  SpO2: 97% 98%  97%  Weight:      Height:       General exam: Afebrile, no chest pain, no nausea vomiting.  Nonverbal at baseline. Respiratory system: Good saturation on room air. Cardiovascular system:RRR. No rubs or gallops. Gastrointestinal system: Abdomen is nondistended, soft and nontender.  Gastrostomy tube in place.  Positive bowel sounds appreciated. Central nervous system: No new focal neurological deficits. Extremities: No cyanosis or clubbing. Skin: No rashes, no petechiae. Psychiatry: Judgement and insight appear normal. Mood & affect appropriate.   Latest data Reviewed: HIV screen fourth-generation: Non-reactive Serum creatinine: 0.48; GFR >60   Family Communication: No family at bedside.  Disposition: Status is: Inpatient Remains inpatient appropriate because: Continue IV therapy.   Planned Discharge Destination: Skilled nursing facility  Time spent: 50 minutes  Author: Vassie Loll, MD 08/25/2023 12:38 PM  For on call review [email or web address].

## 2023-08-25 NOTE — Progress Notes (Signed)
 Initial Nutrition Assessment  DOCUMENTATION CODES:   Obesity unspecified  INTERVENTION:   -TF via PEG:   237 ml Jevity 1.2 6 times daily  30 ml free water flush before and after each feeding administration  Tube feeding regimen provides 1710 kcal (100% of needs), 79 grams of protein, and 1149 ml of H2O.  1509 ml daily  -Pt can resume PTA regimen when discharged back to SNF  NUTRITION DIAGNOSIS:   Inadequate oral intake related to dysphagia as evidenced by NPO status.  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   Reviewed I/O's: +508 ml x 24 hours  Pt unavailable at time of visit. Attempted to speak with pt via call to hospital room phone, however, unable to reach. RD unable to obtain further nutrition-related history or complete nutrition-focused physical exam at this time.    Per TOC notes, pt is from [facility]. Pt is NPO secondary to dysphagia and receives sole source nutrition via PEG. PTA TF regimen: 237 ml Isosource 1.5 5 time daily, 30 ml free water before and after each feeding administration, and 240 ml BID between feedings. Home TF regimen provides 1875 kcals, 85 grams protein, and 1735 ml water daily.   Isosource 1.5 not available on hospital formulary and RD has been consulted to order TF via formulary substitution (Jevity 1.2).   Reviewed wt hx; pt has experienced a 3.4% wt loss over the past 5 months, which is not significant for time frame.   Medications reviewed and  include lovenox, vimpat, and miralax.   Lab Results  Component Value Date   HGBA1C 5.6 06/17/2020   PTA DM medications are none.   Labs reviewed: CBGS: 96 (inpatient orders for glycemic control are none).    Diet Order:   Diet Order             Diet NPO time specified  Diet effective now                   EDUCATION NEEDS:   Not appropriate for education at this time  Skin:  Skin Assessment: Reviewed RN Assessment  Last BM:  Unknown  Height:   Ht Readings from Last 1 Encounters:  08/24/23 5\' 3"  (1.6 m)    Weight:   Wt Readings from Last 1 Encounters:  08/24/23 87.6 kg    Ideal Body Weight:  52.3 kg  BMI:  Body mass index is 34.21 kg/m.  Estimated Nutritional Needs:   Kcal:  1550-1750  Protein:  75-90 grams  Fluid:  > 1.5 L    Levada Schilling, RD, LDN, CDCES Registered Dietitian III Certified Diabetes Care and Education Specialist If unable to reach this RD, please use "RD Inpatient" group chat on secure chat between hours of 8am-4 pm daily

## 2023-08-25 NOTE — Progress Notes (Signed)
 Verbal orders placed for assigned for LPN, confirmed via EPIC messenger per Dr. Arville Care.

## 2023-08-25 NOTE — Progress Notes (Signed)
   08/25/23 0128  Assess: MEWS Score  Temp 99.7 F (37.6 C)  BP (!) 162/99  MAP (mmHg) 118  Pulse Rate (!) 116  Resp 18  SpO2 98 %  O2 Device Room Air  Assess: MEWS Score  MEWS Temp 0  MEWS Systolic 0  MEWS Pulse 2  MEWS RR 0  MEWS LOC 0  MEWS Score 2  MEWS Score Color Yellow  Assess: if the MEWS score is Yellow or Red  Were vital signs accurate and taken at a resting state? Yes  Does the patient meet 2 or more of the SIRS criteria? No  MEWS guidelines implemented  Yes, yellow  Treat  MEWS Interventions Considered administering scheduled or prn medications/treatments as ordered  Take Vital Signs  Increase Vital Sign Frequency  Yellow: Q2hr x1, continue Q4hrs until patient remains green for 12hrs  Escalate  MEWS: Escalate Yellow: Discuss with charge nurse and consider notifying provider and/or RRT  Notify: Charge Nurse/RN  Name of Charge Nurse/RN Notified Glenda Chroman, RN  Provider Notification  Provider Name/Title Arville Care, MD  Date Provider Notified 08/25/23  Time Provider Notified 0240  Method of Notification Page  Assess: SIRS CRITERIA  SIRS Temperature  0  SIRS Respirations  0  SIRS Pulse 1  SIRS WBC 0  SIRS Score Sum  1

## 2023-08-25 NOTE — Progress Notes (Signed)
   08/25/23 1436  Vitals  Temp (!) 100.8 F (38.2 C)  Temp Source Oral  BP 135/73  MAP (mmHg) 92  BP Location Left Arm  BP Method Automatic  Patient Position (if appropriate) Lying  Pulse Rate (!) 110  Pulse Rate Source Dinamap  Level of Consciousness  Level of Consciousness Alert  MEWS COLOR  MEWS Score Color Yellow  Oxygen Therapy  SpO2 96 %  O2 Device Room Air  Pain Assessment  Pain Scale PAINAD  Pain Score Asleep  Faces Pain Scale 0  PAINAD (Pain Assessment in Advanced Dementia)  Breathing 0  Negative Vocalization 0  Facial Expression 0  Body Language 0  Consolability 0  PAINAD Score 0  POSS Scale ([place]ero Opioid Sedation Scale)  POSS *See Group Information* S-Acceptable,Sleep, easy to arouse  PCA/Epidural/Spinal Assessment  Respiratory Pattern Regular;Unlabored  Glasgow Coma Scale  Eye Opening 4  Best Verbal Response (NON-intubated) 2  Best Motor Response 6  Glasgow Coma Scale Score 12  MEWS Score  MEWS Temp 1  MEWS Systolic 0  MEWS Pulse 1  MEWS RR 0  MEWS LOC 0  MEWS Score 2  Provider Notification  Provider Name/Title Gwenlyn Perking  Date Provider Notified 08/25/23  Time Provider Notified 1436  Method of Notification Page  Notification Reason  (temp)  Provider response No new orders  Date of Provider Response 08/25/23  Time of Provider Response 1437

## 2023-08-25 NOTE — Plan of Care (Signed)

## 2023-08-25 NOTE — Progress Notes (Signed)
 Pt's HR and BP elevated, MD notified. IV Carvedilol and Tylenol given per MD order. Pt placed on telemetry. HR WNL after administration of Carvedilol. Pt with excessive oral secretions throughout the night. Oral care provided and suctioned multiple times.

## 2023-08-26 DIAGNOSIS — R7881 Bacteremia: Secondary | ICD-10-CM | POA: Diagnosis not present

## 2023-08-26 LAB — BASIC METABOLIC PANEL
Anion gap: 6 (ref 5–15)
BUN: 13 mg/dL (ref 8–23)
CO2: 27 mmol/L (ref 22–32)
Calcium: 9.7 mg/dL (ref 8.9–10.3)
Chloride: 102 mmol/L (ref 98–111)
Creatinine, Ser: 0.4 mg/dL — ABNORMAL LOW (ref 0.44–1.00)
GFR, Estimated: >60 mL/min (ref >60)
Glucose, Bld: 101 mg/dL — ABNORMAL HIGH (ref 70–99)
Potassium: 3.5 mmol/L (ref 3.5–5.1)
Sodium: 135 mmol/L (ref 135–145)

## 2023-08-26 LAB — CBC
HCT: 30 % — ABNORMAL LOW (ref 36.0–46.0)
Hemoglobin: 9.5 g/dL — ABNORMAL LOW (ref 12.0–15.0)
MCH: 29.3 pg (ref 26.0–34.0)
MCHC: 31.7 g/dL (ref 30.0–36.0)
MCV: 92.6 fL (ref 80.0–100.0)
Platelets: 334 10*3/uL (ref 150–400)
RBC: 3.24 MIL/uL — ABNORMAL LOW (ref 3.87–5.11)
RDW: 14.4 % (ref 11.5–15.5)
WBC: 7.6 10*3/uL (ref 4.0–10.5)
nRBC: 0.3 % — ABNORMAL HIGH (ref 0.0–0.2)

## 2023-08-26 MED ORDER — ORAL CARE MOUTH RINSE: 15.0000 mL | OROMUCOSAL | Status: DC | PRN

## 2023-08-26 NOTE — Progress Notes (Signed)
   08/26/23 2102  Assess: MEWS Score  Temp 99.1 F (37.3 C)  BP (!) 107/57  MAP (mmHg) 72  Pulse Rate (!) 116  Resp 20  SpO2 100 %  O2 Device Room Air  Assess: MEWS Score  MEWS Temp 0  MEWS Systolic 0  MEWS Pulse 2  MEWS RR 0  MEWS LOC 0  MEWS Score 2  MEWS Score Color Yellow  Assess: if the MEWS score is Yellow or Red  Were vital signs accurate and taken at a resting state? Yes  Does the patient meet 2 or more of the SIRS criteria? No  MEWS guidelines implemented  Yes, yellow  Treat  MEWS Interventions Considered administering scheduled or prn medications/treatments as ordered  Take Vital Signs  Increase Vital Sign Frequency  Yellow: Q2hr x1, continue Q4hrs until patient remains green for 12hrs  Escalate  MEWS: Escalate Yellow: Discuss with charge nurse and consider notifying provider and/or RRT  Notify: Charge Nurse/RN  Name of Charge Nurse/RN Notified [place], RN  Assess: SIRS CRITERIA  SIRS Temperature  0  SIRS Respirations  0  SIRS Pulse 1  SIRS WBC 0  SIRS Score Sum  1

## 2023-08-26 NOTE — Progress Notes (Signed)
  Progress Note   Patient: Denise Savage ZOX:[id number] DOB: 1961/02/23 DOA: 08/24/2023     2 DOS: the patient was seen and examined on 08/26/2023   Brief hospital admission narrative:  LORIBETH KATICH is a 63 y.o. 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  Patient apparently seen on 08/23/2023 secondary to low-grade temperature and concern for UTI.  Workup at that time in the ED demonstrated positive UTI but without any signs or symptoms for sepsis.  Patient treated with IV antibiotics and discharge back to her facility with oral Keflex to complete treatment.  Blood cultures taken at that time came back to be positive 2 out of 2 for positive MRSA staff epidermidis.  Facility has been contacted and patient has returned to the hospital for further evaluation and management.   Afebrile; nonverbal at baseline and unchanged.  No other acute complaints or acute distress appreciated on exam.   Repeat blood culture has been taken; IV vancomycin has been started.  Infectious disease service contacted.  Assessment and Plan: 1-MRSA staphylococcus epidermidis bacteremia -Continue to follow culture results -Continue IV vancomycin -Appreciate assistance and recommendation by ID service.  2-UTI -Patient with history of E. coli ESBL -Following ID recommendations we will continue IV antibiotics using ertapenem. -Follow culture results -Maintain adequate hydration -Continue supportive care.  3-seizure disorder -No active seizure appreciated -Continue seizure precaution -Continue treatment with valproic acid and Vimpat.  4-GERD -Continue Pepcid  5-history of stroke -Continue Refacto modification -No acute neurologic deficits currently appreciated.  6-residual dysphagia -Patient is s/p gastrostomy tube placement -Continue tube  feedings, water irrigation and medication through gastrostomy tube. -No signs of malfunctioning or displacement currently appreciated.    Subjective:  No fever, no overnight events.  Physical Exam: Vitals:   08/25/23 1625 08/25/23 1942 08/26/23 0447 08/26/23 1239  BP: (!) 141/98 133/83 (!) 141/85 125/82  Pulse: (!) 101 (!) 103 (!) 103 (!) 108  Resp: 18 18 20 16   Temp: 99.3 F (37.4 C) 99.4 F (37.4 C) 98.6 F (37 C) 98 F (36.7 C)  TempSrc: Oral Oral Oral   SpO2: 98% 97% 100% 100%  Weight:      Height:       General exam: No fever, no overnight events.  No vomiting. Respiratory system: Good saturation on room air. Cardiovascular system:RRR. No rubs or gallops. Gastrointestinal system: Abdomen is nondistended, soft and nontender.  Positive bowel sounds.  PEG tube in place. Central nervous system: No new focal neurological deficits. Extremities: No cyanosis or clubbing. Skin: No petechiae. Psychiatry: Nonverbal at baseline.  Latest data Reviewed: HIV screen fourth-generation: Non-reactive CBC: WBC 7.6, hemoglobin 9.5 and platelet count 334K BMET: Sodium 135, potassium 3.5, chloride 102, bicarb 27, BUN 13, creatinine 0.40, GFR >60  Family Communication: No family at bedside.  Disposition: Status is: Inpatient Remains inpatient appropriate because: Continue IV therapy.   Planned Discharge Destination: Skilled nursing facility  Time spent: 50 minutes  Author: Vassie Loll, MD 08/26/2023 4:27 PM  For on call review [email or web address].

## 2023-08-26 NOTE — Progress Notes (Signed)
   08/26/23 2245  Assess: MEWS Score  Temp 100.2 F (37.9 C)  BP 121/67  MAP (mmHg) 82  Pulse Rate (!) 118  Resp 20  SpO2 100 %  O2 Device Room Air  Assess: MEWS Score  MEWS Temp 0  MEWS Systolic 0  MEWS Pulse 2  MEWS RR 0  MEWS LOC 0  MEWS Score 2  MEWS Score Color Yellow  Assess: if the MEWS score is Yellow or Red  Were vital signs accurate and taken at a resting state? Yes  Does the patient meet 2 or more of the SIRS criteria? No  MEWS guidelines implemented  Yes, yellow  Treat  MEWS Interventions Considered administering scheduled or prn medications/treatments as ordered  Take Vital Signs  Increase Vital Sign Frequency  Yellow: Q2hr x1, continue Q4hrs until patient remains green for 12hrs  Escalate  MEWS: Escalate Yellow: Discuss with charge nurse and consider notifying provider and/or RRT  Notify: Charge Nurse/RN  Name of Charge Nurse/RN Notified Glenda Chroman, RN  Provider Notification  Provider Name/Title Arrien  Date Provider Notified 08/26/23  Method of Notification Page  Provider response No new orders  Assess: SIRS CRITERIA  SIRS Temperature  0  SIRS Respirations  0  SIRS Pulse 1  SIRS WBC 0  SIRS Score Sum  1

## 2023-08-26 NOTE — Plan of Care (Signed)
  Problem: Nutrition: Goal: Adequate nutrition will be maintained Outcome: Progressing   Problem: Pain Managment: Goal: General experience of comfort will improve and/or be controlled Outcome: Progressing   Problem: Skin Integrity: Goal: Risk for impaired skin integrity will decrease Outcome: Progressing

## 2023-08-27 DIAGNOSIS — R7881 Bacteremia: Secondary | ICD-10-CM | POA: Diagnosis not present

## 2023-08-27 MED ORDER — METOPROLOL TARTRATE 25 MG PO TABS
25.0000 mg | ORAL_TABLET | Freq: Two times a day (BID) | ORAL | Status: DC
  Administered 2023-08-27 – 2023-08-29 (×5): 25 mg via ORAL
  Filled 2023-08-27 (×5): qty 1

## 2023-08-27 NOTE — Progress Notes (Signed)
   08/27/23 0305  Assess: MEWS Score  Temp (!) 100.6 F (38.1 C)  BP (!) 154/92  MAP (mmHg) 111  Pulse Rate (!) 120  Resp 20  SpO2 99 %  O2 Device Room Air  Assess: MEWS Score  MEWS Temp 1  MEWS Systolic 0  MEWS Pulse 2  MEWS RR 0  MEWS LOC 0  MEWS Score 3  MEWS Score Color Yellow  Assess: if the MEWS score is Yellow or Red  Were vital signs accurate and taken at a resting state? Yes  Does the patient meet 2 or more of the SIRS criteria? No  MEWS guidelines implemented  Yes, yellow  Treat  MEWS Interventions Considered administering scheduled or prn medications/treatments as ordered  Take Vital Signs  Increase Vital Sign Frequency  Yellow: Q2hr x1, continue Q4hrs until patient remains green for 12hrs  Escalate  MEWS: Escalate Yellow: Discuss with charge nurse and consider notifying provider and/or RRT  Notify: Charge Nurse/RN  Name of Charge Nurse/RN Notified Geneticist, molecular  Assess: SIRS CRITERIA  SIRS Temperature  0  SIRS Respirations  0  SIRS Pulse 1  SIRS WBC 0  SIRS Score Sum  1     08/27/23 0305  Assess: MEWS Score  Temp (!) 100.6 F (38.1 C)  BP (!) 154/92  MAP (mmHg) 111  Pulse Rate (!) 120  Resp 20  SpO2 99 %  O2 Device Room Air  Assess: MEWS Score  MEWS Temp 1  MEWS Systolic 0  MEWS Pulse 2  MEWS RR 0  MEWS LOC 0  MEWS Score 3  MEWS Score Color Yellow  Assess: if the MEWS score is Yellow or Red  Were vital signs accurate and taken at a resting state? Yes  Does the patient meet 2 or more of the SIRS criteria? No  MEWS guidelines implemented  Yes, yellow  Treat  MEWS Interventions Considered administering scheduled or prn medications/treatments as ordered  Take Vital Signs  Increase Vital Sign Frequency  Yellow: Q2hr x1, continue Q4hrs until patient remains green for 12hrs  Escalate  MEWS: Escalate Yellow: Discuss with charge nurse and consider notifying provider and/or RRT  Notify: Charge Nurse/RN  Name of Charge Nurse/RN Notified Geneticist, molecular   Assess: SIRS CRITERIA  SIRS Temperature  0  SIRS Respirations  0  SIRS Pulse 1  SIRS WBC 0  SIRS Score Sum  1

## 2023-08-27 NOTE — Progress Notes (Addendum)
 Pharmacy Antibiotic Note  Denise Savage is a 63 y.o. female admitted on 08/24/2023 with bacteremia.  Pharmacy has been consulted for vancomycin and ertapenem dosing.  Patient still with low grade temps overnight with a tmax of 100.6, wbc remain normal. Renal function improved and normal with scr of 0.4. Patient currently on day #3 of antibiotics.   Plan: Vancomycin  1250 IV every 24 hours Will plan on checking levels early this week Ertapenem 1 gm IV every 24 hours based on ID recs   Height: 5\' 3"  (160 cm) Weight: 87.6 kg (193 lb 2 oz) IBW/kg (Calculated) : 52.4  Temp (24hrs), Avg:99.4 F (37.4 C), Min:98 F (36.7 C), Max:100.6 F (38.1 C)  Recent Labs  Lab 08/23/23 1035 08/24/23 1327 08/25/23 0407 08/26/23 0320  WBC 7.4 6.3  --  7.6  CREATININE 0.59 0.52 0.48 0.40*  LATICACIDVEN 1.9  --   --   --     Estimated Creatinine Clearance: 75.6 mL/min (A) (by C-G formula based on SCr of 0.4 mg/dL (L)).    Allergies  Allergen Reactions   Hydrocodone Nausea Only   Antimicrobials this admission: CTX 3/12 >> 3/13  Vanco 3/13 >> Ertapenem 3/13 >>  Microbiology results: 3/13 Bcx: ngtd 3/12 BCx: gram + cocci 3/4 bottles BCID: stap epi>MRSE 3/12 UCx: ESBL ecoli   Thank you for allowing pharmacy to be a part of this patient's care.  Sheppard Coil PharmD., BCPS Clinical Pharmacist 08/27/2023 8:48 AM

## 2023-08-27 NOTE — Progress Notes (Signed)
  Progress Note   Patient: Denise Savage ZOX:[id number] DOB: Mar 22, 1961 DOA: 08/24/2023     3 DOS: the patient was seen and examined on 08/27/2023   Brief hospital admission narrative:  Denise Savage is a 63 y.o. 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  Patient apparently seen on 08/23/2023 secondary to low-grade temperature and concern for UTI.  Workup at that time in the ED demonstrated positive UTI but without any signs or symptoms for sepsis.  Patient treated with IV antibiotics and discharge back to her facility with oral Keflex to complete treatment.  Blood cultures taken at that time came back to be positive 2 out of 2 for positive MRSA staff epidermidis.  Facility has been contacted and patient has returned to the hospital for further evaluation and management.   Afebrile; nonverbal at baseline and unchanged.  No other acute complaints or acute distress appreciated on exam.   Repeat blood culture has been taken; IV vancomycin has been started.  Infectious disease service contacted.  Assessment and Plan: 1-MRSA staphylococcus epidermidis bacteremia -Continue to follow culture results -Continue IV vancomycin -Appreciate assistance and recommendation by ID service.  2-UTI -Patient with history of E. coli ESBL -Following ID recommendations we will continue IV antibiotics using ertapenem. -Follow culture results -Maintain adequate hydration -Continue supportive care.  3-seizure disorder -No active seizure appreciated -Continue seizure precaution -Continue treatment with valproic acid and Vimpat.  4-GERD -Continue Pepcid  5-history of stroke -Continue Refacto modification -No acute neurologic deficits currently appreciated.  6-residual dysphagia -Patient is s/p gastrostomy tube placement -Continue tube  feedings, water irrigation and medication through gastrostomy tube. -No signs of malfunctioning or displacement currently appreciated.  7-hypertension/tachycardia -Lopressor twice a day has been initiated -Follow vital signs.    Subjective:  Low-grade temperature appreciated overnight; hypertensive and tachycardic.  Physical Exam: Vitals:   08/27/23 0930 08/27/23 1314 08/27/23 1323 08/27/23 1512  BP: (!) 156/90 (!) 154/96 (!) 160/82 (!) 153/92  Pulse: (!) 115 (!) 113 (!) 116 (!) 102  Resp: 20 (!) 24 (!) 24 20  Temp: 98.7 F (37.1 C) 99.5 F (37.5 C) 99.1 F (37.3 C) 99.5 F (37.5 C)  TempSrc: Axillary Axillary Rectal Oral  SpO2: 98% 100% 100% 98%  Weight:      Height:       General exam: Low-grade temperature appreciated overnight; no nausea, no vomiting. Respiratory system: Good saturation on room air. Cardiovascular system: Sinus tachycardia, no rubs, no gallops, no JVD. Gastrointestinal system: Abdomen is nondistended, soft and nontender. No organomegaly or masses felt. Normal bowel sounds heard. Central nervous system: No new focal neurological deficits. Extremities: No cyanosis or clubbing. Skin: No petechiae. Psychiatry: Nonverbal at baseline.  Latest data Reviewed: HIV screen fourth-generation: Non-reactive CBC: WBC 7.6, hemoglobin 9.5 and platelet count 334K BMET: Sodium 135, potassium 3.5, chloride 102, bicarb 27, BUN 13, creatinine 0.40, GFR >60  Family Communication: No family at bedside.  Disposition: Status is: Inpatient Remains inpatient appropriate because: Continue IV therapy.   Planned Discharge Destination: Skilled nursing facility  Time spent: 50 minutes  Author: Vassie Loll, MD 08/27/2023 3:22 PM  For on call review [email or web address].

## 2023-08-28 DIAGNOSIS — R7881 Bacteremia: Secondary | ICD-10-CM | POA: Diagnosis not present

## 2023-08-28 LAB — CREATININE, SERUM
Creatinine, Ser: 0.35 mg/dL — ABNORMAL LOW (ref 0.44–1.00)
GFR, Estimated: >60 mL/min (ref >60)

## 2023-08-28 MED ORDER — POLYETHYLENE GLYCOL 3350 17 G PO PACK: 17.0000 g | PACK | Freq: Every day | ORAL | Status: DC | PRN

## 2023-08-28 MED ORDER — FLORANEX PO PACK
1.0000 g | PACK | Freq: Three times a day (TID) | ORAL | Status: DC
  Administered 2023-08-28 – 2023-08-29 (×5): 1 g
  Filled 2023-08-28 (×5): qty 1

## 2023-08-28 MED ORDER — BACID PO TABS
2.0000 | ORAL_TABLET | Freq: Three times a day (TID) | ORAL | Status: DC
  Filled 2023-08-28 (×3): qty 2

## 2023-08-28 MED ORDER — SACCHAROMYCES BOULARDII 250 MG PO CAPS: 250.0000 mg | ORAL_CAPSULE | Freq: Two times a day (BID) | ORAL | Status: DC

## 2023-08-28 MED ORDER — FLORANEX PO PACK: 1.0000 g | PACK | Freq: Three times a day (TID) | ORAL | Status: DC

## 2023-08-28 NOTE — Progress Notes (Signed)
  Progress Note   Patient: Denise Savage [id number]:[id number] DOB: July 03, 1960 DOA: 08/24/2023     4 DOS: the patient was seen and examined on 08/28/2023   Brief hospital admission narrative:  Denise Savage is a 63 y.o. 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  Patient apparently seen on 08/23/2023 secondary to low-grade temperature and concern for UTI.  Workup at that time in the ED demonstrated positive UTI but without any signs or symptoms for sepsis.  Patient treated with IV antibiotics and discharge back to her facility with oral Keflex to complete treatment.  Blood cultures taken at that time came back to be positive 2 out of 2 for positive MRSA staff epidermidis.  Facility has been contacted and patient has returned to the hospital for further evaluation and management.   Afebrile; nonverbal at baseline and unchanged.  No other acute complaints or acute distress appreciated on exam.   Repeat blood culture has been taken; IV vancomycin has been started.  Infectious disease service contacted.  Assessment and Plan: 1-MRSA staphylococcus epidermidis bacteremia -Continue to follow culture results -Continue IV vancomycin -Appreciate assistance and recommendation by ID service.  2-UTI -Patient with history of E. coli ESBL -Following ID recommendations we will continue IV antibiotics using ertapenem. -Follow culture results -Maintain adequate hydration -Continue supportive care.  3-seizure disorder -No active seizure appreciated -Continue seizure precaution -Continue treatment with valproic acid and Vimpat.  4-GERD -Continue Pepcid  5-history of stroke -Continue Refacto modification -No acute neurologic deficits currently appreciated.  6-residual dysphagia -Patient is s/p gastrostomy tube placement -Continue tube  feedings, water irrigation and medication through gastrostomy tube. -No signs of malfunctioning or displacement currently appreciated.  7-hypertension/tachycardia -Lopressor twice a day has been initiated -Follow vital signs.    Subjective:  No overnight events; positive low-grade temperature appreciated per chart review.  In no acute distress on today's evaluation.  Physical Exam: Vitals:   08/28/23 0133 08/28/23 0434 08/28/23 0939 08/28/23 1235  BP:  105/66 126/81 116/75  Pulse:  98 (!) 109 95  Resp:  (!) 23 20   Temp: 99.1 F (37.3 C) 98.2 F (36.8 C)    TempSrc: Axillary Axillary    SpO2:  95%  100%  Weight:      Height:       General exam: Low-grade temperature overnight; no nausea or vomiting. Respiratory system: Good saturation on room air. Cardiovascular system: Rate controlled, no rubs, no gallops, no JVD on exam. Gastrointestinal system: Abdomen is nondistended, soft and without guarding.  Positive bowel sounds appreciated.  PEG tube in place. Central nervous system: No new focal neurological deficits. Extremities: No cyanosis or clubbing. Skin: No petechiae. Psychiatry: #4 at baseline.  Latest data Reviewed: HIV screen fourth-generation: Non-reactive CBC: WBC 7.6, hemoglobin 9.5 and platelet count 334K BMET: Sodium 135, potassium 3.5, chloride 102, bicarb 27, BUN 13, creatinine 0.40, GFR >60  Family Communication: No family at bedside.  Disposition: Status is: Inpatient Remains inpatient appropriate because: Continue IV therapy.   Planned Discharge Destination: Skilled nursing facility  Time spent: 50 minutes  Author: Vassie Loll, MD 08/28/2023 1:46 PM  For on call review [email or web address].

## 2023-08-28 NOTE — Plan of Care (Signed)
   Problem: Nutrition: Goal: Adequate nutrition will be maintained Outcome: Progressing

## 2023-08-28 NOTE — NC FL2 (Signed)
 [place] MEDICAID FL2 LEVEL OF CARE FORM     IDENTIFICATION  Patient Name: Denise Savage Birthdate: [birth year] Sex: female Admission Date (Current Location): 08/24/2023  [facility] and [place] Number:  [employer] and Address:  [facility],  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   Seizures (HCC)    Pulmonary embolus (HCC)    Dysphagia    Prolonged QT interval    Thickened endometrium    ICH (intracerebral hemorrhage) (HCC) 06/18/2020   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   SAH (subarachnoid hemorrhage) (HCC) 06/15/2020    Orientation RESPIRATION BLADDER Height & Weight        Normal Incontinent Weight: 193 lb 2 oz (87.6 kg) Height:  5\' 3"  (160 cm)  BEHAVIORAL SYMPTOMS/MOOD NEUROLOGICAL BOWEL NUTRITION STATUS      Incontinent Diet, Feeding tube (see dc summary)  AMBULATORY STATUS COMMUNICATION OF NEEDS Skin   Total Care Does not communicate Normal                       Personal Care Assistance Level of Assistance  Bathing, Feeding, Dressing, Total care Bathing Assistance: Maximum assistance Feeding assistance: Maximum assistance Dressing Assistance: Maximum assistance Total Care Assistance: Maximum assistance   Functional Limitations Info  Sight, Hearing, Speech Sight Info: Adequate Hearing Info: Adequate Speech Info: Impaired    SPECIAL CARE FACTORS  FREQUENCY                       Contractures Contractures Info: Not present    Additional Factors Info  Code Status, Allergies Code Status Info: Full Allergies Info: Hydrocodone           Current Medications (08/28/2023):  This is the current hospital active medication list Current Facility-Administered Medications  Medication Dose Route Frequency Provider Last Rate Last Admin   acetaminophen (TYLENOL) tablet 650 mg  650 mg Per Tube Q6H PRN Vassie Loll, MD   650 mg at 08/27/23 2345   Or   acetaminophen (TYLENOL) suppository 650 mg  650 mg Rectal Q6H PRN Vassie Loll, MD       enoxaparin (LOVENOX) injection 40 mg  40 mg Subcutaneous Q24H Vassie Loll, MD   40 mg at 08/27/23 2258   ertapenem [facility]) 1 g in sodium chloride 0.9 % 100 mL IVPB  1 g Intravenous Q24H Meegan, Eryn, RPH 200 mL/hr at 08/27/23 2159 1 g at 08/27/23 2159   famotidine (PEPCID) tablet 20 mg  20 mg Per Tube BID Vassie Loll, MD   20 mg at 08/28/23 0940   feeding supplement (JEVITY 1.2 CAL) liquid 237 mL  237 mL Per Tube 6 X Daily Vassie Loll, MD   237 mL at 08/28/23 0941   free water 60 mL  60 mL Per Tube 6 X Daily Vassie Loll, MD   60 mL at 08/28/23  0454   glycopyrrolate (ROBINUL) tablet 1 mg  1 mg Per Tube Q8H PRN Vassie Loll, MD   1 mg at 08/28/23 0940   hydrOXYzine (ATARAX) tablet 25 mg  25 mg Per Tube Q6H PRN Vassie Loll, MD   25 mg at 08/26/23 2138   lacosamide (VIMPAT) oral solution 100 mg  100 mg Per Tube BID Vassie Loll, MD   100 mg at 08/28/23 0940   lactobacillus (FLORANEX/LACTINEX) granules 1 g  1 g Per Tube TID WC Vassie Loll, MD   1 g at 08/28/23 0940   LORazepam (ATIVAN) tablet 1 mg  1 mg Per Tube TID Vassie Loll, MD   1 mg at 08/28/23 0940   metoprolol tartrate (LOPRESSOR) tablet 25 mg  25 mg Oral BID Vassie Loll, MD   25 mg at 08/28/23 0981   multivitamin with minerals tablet 1 tablet  1 tablet Per Tube Daily Vassie Loll, MD   1 tablet at 08/28/23 0940    Oral care mouth rinse  15 mL Mouth Rinse PRN Vassie Loll, MD       polyethylene glycol (MIRALAX / GLYCOLAX) packet 17 g  17 g Per Tube BID Vassie Loll, MD   17 g at 08/27/23 2259   prochlorperazine (COMPAZINE) injection 10 mg  10 mg Intravenous Q6H PRN Vassie Loll, MD       tiZANidine (ZANAFLEX) tablet 2 mg  2 mg Per Tube Q8H PRN Vassie Loll, MD       traZODone (DESYREL) tablet 75 mg  75 mg Per Tube QHS Vassie Loll, MD   75 mg at 08/27/23 2300   valproic acid (DEPAKENE) 250 MG/5ML solution 750 mg  750 mg Per Tube TID Vassie Loll, MD   750 mg at 08/28/23 0940   vancomycin (VANCOREADY) IVPB 1250 mg/250 mL  1,250 mg Intravenous Q24H Vassie Loll, MD 166.7 mL/hr at 08/27/23 1156 1,250 mg at 08/27/23 1156     Discharge Medications: Please see discharge summary for a list of discharge medications.  Relevant Imaging Results:  Relevant Lab Results:   Additional Information    Elliot Gault, LCSW

## 2023-08-29 DIAGNOSIS — R7881 Bacteremia: Secondary | ICD-10-CM | POA: Diagnosis not present

## 2023-08-29 LAB — CULTURE, BLOOD (ROUTINE X 2)
Culture: NO GROWTH
Culture: NO GROWTH
Special Requests: ADEQUATE
Special Requests: ADEQUATE

## 2023-08-29 LAB — VANCOMYCIN, TROUGH: Vancomycin Tr: 9 ug/mL — ABNORMAL LOW (ref 15–20)

## 2023-08-29 MED ORDER — POLYETHYLENE GLYCOL 3350 17 G PO PACK: 17.0000 g | PACK | Freq: Every day | ORAL | Status: DC | PRN

## 2023-08-29 MED ORDER — FLORANEX PO PACK: 1.0000 g | PACK | Freq: Three times a day (TID) | ORAL | Status: AC

## 2023-08-29 MED ORDER — TIZANIDINE HCL 2 MG PO CAPS: 2.0000 mg | ORAL_CAPSULE | Freq: Three times a day (TID) | ORAL | Status: DC | PRN

## 2023-08-29 MED ORDER — LORAZEPAM 1 MG PO TABS: 1.0000 mg | ORAL_TABLET | Freq: Four times a day (QID) | ORAL | 0 refills | Status: DC

## 2023-08-29 MED ORDER — METOPROLOL TARTRATE 25 MG PO TABS
25.0000 mg | ORAL_TABLET | Freq: Two times a day (BID) | ORAL | 1 refills | Status: AC
Start: 2023-08-29 — End: ?

## 2023-08-29 NOTE — Plan of Care (Signed)

## 2023-08-29 NOTE — Progress Notes (Signed)
 Report called to [facility] at [facility].

## 2023-08-29 NOTE — Discharge Summary (Signed)
 Physician Discharge Summary   Patient: Denise Savage MRN: [id number] DOB: 1961/01/21  Admit date:     08/24/2023  Discharge date: 08/29/23  Discharge Physician: Vassie Loll   PCP: Babs Sciara, MD   Recommendations at discharge:  Repeat basic metabolic panel to follow ultralights renal function. Reassess vital signs and adjust antihypertensive agents as needed. Goals of care and advance care planning discussion recommended.  Discharge Diagnoses: Principal Problem:   Bacteremia ESBL UTI seizure disorder GERD History of stroke (with residual deficits and nonverbal at baseline). Dysphagia Hypertension/tachycardia. Brief hospital admission narrative:  Denise Savage is a 63 y.o. 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  Patient apparently seen on 08/23/2023 secondary to low-grade temperature and concern for UTI.  Workup at that time in the ED demonstrated positive UTI but without any signs or symptoms for sepsis.  Patient treated with IV antibiotics and discharge back to her facility with oral Keflex to complete treatment.  Blood cultures taken at that time came back to be positive 2 out of 2 for positive MRSA staff epidermidis.  Facility has been contacted and patient has returned to the hospital for further evaluation and management.   Afebrile; nonverbal at baseline and unchanged.  No other acute complaints or acute distress appreciated on exam.   Repeat blood culture has been taken; IV vancomycin has been started.  Infectious disease service contacted.  Assessment and Plan: 1-MRSA staphylococcus epidermidis bacteremia -Repeat cultures without any growth -Patient has completed 5 days of IV antibiotics using vancomycin -Appreciate assistance and recommendation by ID service.   2-UTI -Patient with  history of E. coli ESBL -Following ID recommendations patient received 5 days of ertapenem. -Afebrile and asymptomatic at discharge.   3-seizure disorder -No active seizure appreciated -Continue seizure precaution -Continue treatment with valproic acid and Vimpat.   4-GERD -Continue Pepcid   5-history of stroke -Continue Refacto modification -No acute neurologic deficits currently appreciated.   6-residual dysphagia -Patient is s/p gastrostomy tube placement -Continue tube feedings, water irrigation and medication through gastrostomy tube. -No signs of malfunctioning or displacement currently appreciated.   7-hypertension/tachycardia -Lopressor twice a day has been initiated -Stable vital signs at discharge.   Consultants: Infectious disease. Procedures performed: See below for x-ray reports. Disposition: Skilled nursing facility long-term resident. Diet recommendation: Continue PEG tube feedings.  DISCHARGE MEDICATION: Allergies as of 08/29/2023       Reactions   Hydrocodone Nausea Only        Medication List     STOP taking these medications    cephALEXin 500 MG capsule Commonly known as: KEFLEX   medroxyPROGESTERone 150 MG/ML injection Commonly known as: DEPO-PROVERA       TAKE these medications    acetaminophen 325 MG tablet Commonly known as: TYLENOL Place 650 mg into feeding tube in the morning, at noon, in the evening, and at bedtime. (0000, 0600, 1200, 1800)   antiseptic oral rinse 0.05 % Liqd solution Commonly known as: CPC / CETYLPYRIDINIUM CHLORIDE 0.05% 15 mLs by Mouth Rinse route in the morning, at noon, in the evening, and at bedtime. For mouth care use mouth swabs to clean out mouth   baclofen 25 MG/5ML Susp oral suspension Commonly known as: FLEQSUVY   cholecalciferol 25 MCG (1000 UNIT) tablet Commonly known as: VITAMIN D3 Take 1,000 Units by mouth daily.   famotidine 20 MG tablet  Commonly known as: PEPCID Take 20 mg by mouth 2  (two) times daily.   fiber supplement (BANATROL TF) liquid Place 60 mLs into feeding tube 3 (three) times daily.   free water Soln Place 30-240 mLs into feeding tube See admin instructions. Flush feeding tube with 240 mls of water twice daily between feedings, flush with 60 ml of water before and after initiating feedings &  Flush peg tube with 30 cc of water before and after medication administration   glycopyrrolate 1 MG tablet Commonly known as: ROBINUL Take 1 mg by mouth every 8 (eight) hours as needed (increased secretions).   hydrOXYzine 10 MG/5ML syrup Commonly known as: ATARAX Place 12.5 mLs (25 mg total) into feeding tube every 6 (six) hours as needed for anxiety.   Isosource 1.5 Cal Liqd Place 237 mLs into feeding tube 5 (five) times daily. (0600, 1000, 1400, 1800 & 2200)   lacosamide 10 MG/ML oral solution Commonly known as: VIMPAT Take 10 mLs by mouth 2 (two) times daily.   lactobacillus Pack Place 1 packet (1 g total) into feeding tube 3 (three) times daily with meals.   LORazepam 1 MG tablet Commonly known as: ATIVAN Place 1 tablet (1 mg total) into feeding tube in the morning, at noon, in the evening, and at bedtime. (0000, 0600, 1200, 1800)   Melatonin 10 MG Tabs Take 10 mg by mouth at bedtime. (2100)   metoprolol tartrate 25 MG tablet Commonly known as: LOPRESSOR Take 1 tablet (25 mg total) by mouth 2 (two) times daily.   multivitamin with minerals Tabs tablet Take 1 tablet by mouth daily.   Nuedexta 20-10 MG capsule Generic drug: Dextromethorphan-quiNIDine 1 capsule every 12 (twelve) hours. Via G-tube   polyethylene glycol 17 g packet Commonly known as: MIRALAX / GLYCOLAX Take 17 g by mouth daily as needed. What changed:  when to take this reasons to take this   tizanidine 2 MG capsule Commonly known as: ZANAFLEX Take 1 capsule (2 mg total) by mouth 3 (three) times daily as needed for muscle spasms. What changed:  when to take this reasons to  take this   traZODone 50 MG tablet Commonly known as: DESYREL Take 75 mg by mouth at bedtime. (2200)   valproic acid 250 MG/5ML solution Commonly known as: DEPAKENE Place 15 mLs (750 mg total) into feeding tube 3 (three) times daily.        Follow-up Information     Luking, Jonna Coup, MD. Schedule an appointment as soon as possible for a visit in 10 day(s).   Specialty: Family Medicine Contact information: [address] [place] [address] [phone]                Discharge Exam: Filed Weights   08/24/23 1226  Weight: 87.6 kg   General exam: afebrile, no nausea or vomiting.no acute distress Respiratory system: Good saturation on room air. No using accessory muscles Cardiovascular system: Rate controlled, no rubs, no gallops, no JVD on exam. Gastrointestinal system: Abdomen is nondistended, soft and without guarding.  Positive bowel sounds appreciated.  PEG tube in place. Central nervous system: No new focal neurological deficits. Extremities: No cyanosis or clubbing. Skin: No petechiae. Psychiatry: no verbal at baseline.  Condition at discharge:stable and improved  The results of significant diagnostics from this hospitalization (including imaging, microbiology, ancillary and laboratory) are listed below for reference.   Imaging Studies: DG Chest Portable 1 View Result Date: 08/23/2023 CLINICAL DATA:  Fever EXAM: PORTABLE CHEST  1 VIEW COMPARISON:  03/26/2023 FINDINGS: Patient is rotated. Stable heart size. Minimal left basilar atelectasis. Lungs are otherwise clear. No pleural effusion or pneumothorax. IMPRESSION: Minimal left basilar atelectasis. Electronically Signed   By: Duanne Guess D.O.   On: 08/23/2023 12:29    Microbiology: Results for orders placed or performed during the hospital encounter of 08/24/23  Culture, blood (routine x 2)     Status: None   Collection Time: 08/24/23  1:21 PM   Specimen: BLOOD  Result Value Ref Range Status    Specimen Description BLOOD BLOOD RIGHT FOREARM  Final   Special Requests   Final    BOTTLES DRAWN AEROBIC ONLY Blood Culture results may not be optimal due to an inadequate volume of blood received in culture bottles   Culture   Final    NO GROWTH 5 DAYS Performed at [facility], [address], [place], [place] [address]    Report Status 08/29/2023 FINAL  Final  Culture, blood (routine x 2)     Status: None   Collection Time: 08/24/23  1:27 PM   Specimen: BLOOD  Result Value Ref Range Status   Specimen Description BLOOD BLOOD LEFT HAND  Final   Special Requests   Final    BOTTLES DRAWN AEROBIC ONLY Blood Culture results may not be optimal due to an inadequate volume of blood received in culture bottles   Culture   Final    NO GROWTH 5 DAYS Performed at [facility], [address] [place], [place] [address]    Report Status 08/29/2023 FINAL  Final    Labs: CBC: Recent Labs  Lab 08/23/23 1035 08/24/23 1327 08/26/23 0320  WBC 7.4 6.3 7.6  NEUTROABS 4.8 4.2  --   HGB 11.1* 10.8* 9.5*  HCT 35.9* 35.0* 30.0*  MCV 94.0 94.3 92.6  PLT 335 270 334   Basic Metabolic Panel: Recent Labs  Lab 08/23/23 1035 08/24/23 1327 08/25/23 0407 08/26/23 0320 08/28/23 0409  NA 135 137  --  135  --   K 3.4* 3.6  --  3.5  --   CL 96* 101  --  102  --   CO2 29 28  --  27  --   GLUCOSE 108* 125*  --  101*  --   BUN 15 16  --  13  --   CREATININE 0.59 0.52 0.48 0.40* 0.35*  CALCIUM 10.3 10.2  --  9.7  --    Liver Function Tests: Recent Labs  Lab 08/23/23 1035  AST 22  ALT 10  ALKPHOS 62  BILITOT 0.2  PROT 7.1  ALBUMIN 2.2*   CBG: Recent Labs  Lab 08/25/23 0823  GLUCAP 96    Discharge time spent: greater than 30 minutes.  Signed: Vassie Loll, MD [facility] 08/29/2023

## 2023-08-29 NOTE — Plan of Care (Signed)
  Problem: Education: Goal: Knowledge of General Education information will improve Description: Including pain rating scale, medication(s)/side effects and non-pharmacologic comfort measures Outcome: Not Progressing   Problem: Health Behavior/Discharge Planning: Goal: Ability to manage health-related needs will improve Outcome: Not Progressing   Problem: Clinical Measurements: Goal: Ability to maintain clinical measurements within normal limits will improve Outcome: Progressing Goal: Will remain free from infection Outcome: Progressing Goal: Diagnostic test results will improve Outcome: Progressing Goal: Respiratory complications will improve Outcome: Progressing Goal: Cardiovascular complication will be avoided Outcome: Progressing   Problem: Activity: Goal: Risk for activity intolerance will decrease Outcome: Progressing   Problem: Nutrition: Goal: Adequate nutrition will be maintained Outcome: Progressing   Problem: Coping: Goal: Level of anxiety will decrease Outcome: Progressing   Problem: Elimination: Goal: Will not experience complications related to bowel motility Outcome: Progressing Goal: Will not experience complications related to urinary retention Outcome: Progressing   Problem: Pain Managment: Goal: General experience of comfort will improve and/or be controlled Outcome: Progressing   Problem: Safety: Goal: Ability to remain free from injury will improve Outcome: Progressing   Problem: Skin Integrity: Goal: Risk for impaired skin integrity will decrease Outcome: Progressing

## 2023-08-29 NOTE — TOC Transition Note (Signed)
 Transition of Care [facility]) - Discharge Note   Patient Details  Name: Denise Savage MRN: [id number] Date of Birth: Feb 28, 1961  [facility] CM/SW Contact:  Leitha Bleak, RN Phone Number: 08/29/2023, 1:45 PM   Clinical Narrative:   Patient discharging back to [facility], DC summary sent, RN calling report. TOC will call EMS, [facility] is having her room cleaned. CM updated her daughter with discharge plan.     Final next level of care: Skilled Nursing Facility Barriers to Discharge: Barriers Resolved   Patient Goals and CMS Choice Patient states their goals for this hospitalization and ongoing recovery are:: Non-verbal at baseline.          Discharge Placement                Patient to be transferred to facility by: EMS Name of family member notified: [name] Patient and family notified of of transfer: 08/29/23  Discharge Plan and Services Additional resources added to the After Visit Summary for       Post Acute Care Choice: Skilled Nursing Facility                               Social Drivers of Health (SDOH) Interventions SDOH Screenings   Food Insecurity: Patient Unable To Answer (08/24/2023)  Housing: Patient Unable To Answer (08/24/2023)  Transportation Needs: Patient Unable To Answer (08/24/2023)  Utilities: Patient Unable To Answer (08/24/2023)  Social Connections: Patient Unable To Answer (08/24/2023)  Tobacco Use: Low Risk  (08/24/2023)     Readmission Risk Interventions     No data to display

## 2023-10-05 ENCOUNTER — Emergency Department (HOSPITAL_COMMUNITY)

## 2023-10-05 ENCOUNTER — Other Ambulatory Visit: Payer: Self-pay

## 2023-10-05 ENCOUNTER — Emergency Department (HOSPITAL_COMMUNITY)
Admission: EM | Admit: 2023-10-05 | Discharge: 2023-10-05 | Disposition: A | Discharge status: Home / Self Care | Attending: Emergency Medicine | Admitting: Emergency Medicine

## 2023-10-05 ENCOUNTER — Encounter (HOSPITAL_COMMUNITY): Payer: Self-pay

## 2023-10-05 DIAGNOSIS — Z431 Encounter for attention to gastrostomy: Secondary | ICD-10-CM | POA: Insufficient documentation

## 2023-10-05 DIAGNOSIS — Z8673 Personal history of transient ischemic attack (TIA), and cerebral infarction without residual deficits: Secondary | ICD-10-CM | POA: Insufficient documentation

## 2023-10-05 MED ORDER — DIATRIZOATE MEGLUMINE & SODIUM 66-10 % PO SOLN
60.0000 mL | Freq: Once | ORAL | Status: AC
  Administered 2023-10-05: 60 mL via ORAL
  Filled 2023-10-05: qty 60

## 2023-10-05 NOTE — ED Notes (Signed)
 Per ems pt just had peg tube feeding about 15 minutes ago before pulling it out. Facility informed EMS that though that peg tube was sutured in but it was not.

## 2023-10-05 NOTE — Discharge Instructions (Signed)
 Replaced your gastrostomy tube today.  Follow-up with your doctor.  Come back to the ER for new or worsening symptoms.

## 2023-10-05 NOTE — ED Triage Notes (Signed)
 PT BIB ems for pulling out her peg tube. Facility called and stated they were unable to replace her peg tub in facility and was transporting her just for this issue.

## 2023-10-05 NOTE — ED Provider Notes (Signed)
 [place] EMERGENCY DEPARTMENT AT [facility] Provider Note   CSN: [id number] Arrival date & time: 10/05/23  1417     History  Chief Complaint  Patient presents with   Peg Tube Placement    Denise Savage is a 63 y.o. female.  Patient has history of aneurysmal subarachnoid hemorrhage, seizures, comes from facility, is bedbound at baseline and nonverbal.  She was brought in today because her PEG tube came out and facility was not able to replace it.  Her son was at bedside and states she has had the PEG tube for a long time since she had a stroke that left her bedbound.  HPI     Home Medications Prior to Admission medications   Medication Sig Start Date End Date Taking? Authorizing Provider  acetaminophen  (TYLENOL ) 325 MG tablet Place 650 mg into feeding tube in the morning, at noon, in the evening, and at bedtime. (0000, 0600, 1200, 1800)    [provider]  antiseptic oral rinse (CPC / CETYLPYRIDINIUM CHLORIDE 0.05%) 0.05 % LIQD solution 15 mLs by Mouth Rinse route in the morning, at noon, in the evening, and at bedtime. For mouth care use mouth swabs to clean out mouth    [provider]  baclofen  (FLEQSUVY ) 25 MG/5ML SUSP oral suspension  06/09/23   [provider]  cholecalciferol (VITAMIN D3) 25 MCG (1000 UNIT) tablet Take 1,000 Units by mouth daily.    [provider]  Dextromethorphan -quiNIDine (NUEDEXTA ) 20-10 MG capsule 1 capsule every 12 (twelve) hours. Via G-tube    [provider]  famotidine  (PEPCID ) 20 MG tablet Take 20 mg by mouth 2 (two) times daily.    [provider]  fiber supplement, BANATROL TF, liquid Place 60 mLs into feeding tube 3 (three) times daily. 02/03/23   Aura Leeds Latif, DO  glycopyrrolate  (ROBINUL ) 1 MG tablet Take 1 mg by mouth every 8 (eight) hours as needed (increased secretions).    [provider]  hydrOXYzine  (ATARAX ) 10 MG/5ML syrup Place 12.5 mLs (25 mg total) into  feeding tube every 6 (six) hours as needed for anxiety. 02/03/23   Aura Leeds Latif, DO  lacosamide  (VIMPAT ) 10 MG/ML oral solution Take 10 mLs by mouth 2 (two) times daily. 03/13/23   [provider]  LORazepam  (ATIVAN ) 1 MG tablet Place 1 tablet (1 mg total) into feeding tube in the morning, at noon, in the evening, and at bedtime. (0000, 0600, 1200, 1800) 08/29/23   Justina Oman, MD  Melatonin 10 MG TABS Take 10 mg by mouth at bedtime. (2100)    [provider]  metoprolol  tartrate (LOPRESSOR ) 25 MG tablet Take 1 tablet (25 mg total) by mouth 2 (two) times daily. 08/29/23   Justina Oman, MD  Multiple Vitamin (MULTIVITAMIN WITH MINERALS) TABS tablet Take 1 tablet by mouth daily.    [provider]  Nutritional Supplements (ISOSOURCE 1.5 CAL) LIQD Place 237 mLs into feeding tube 5 (five) times daily. (0600, 1000, 1400, 1800 & 2200)    [provider]  polyethylene glycol (MIRALAX  / GLYCOLAX ) 17 g packet Take 17 g by mouth daily as needed. 08/29/23   Justina Oman, MD  tizanidine  (ZANAFLEX ) 2 MG capsule Take 1 capsule (2 mg total) by mouth 3 (three) times daily as needed for muscle spasms. 08/29/23   Justina Oman, MD  traZODone  (DESYREL ) 50 MG tablet Take 75 mg by mouth at bedtime. (2200) 02/21/21   [provider]  valproic  acid (DEPAKENE ) 250  MG/5ML solution Place 15 mLs (750 mg total) into feeding tube 3 (three) times daily. 02/03/23   Aura Leeds Latif, DO  Water  For Irrigation, Sterile (FREE WATER ) SOLN Place 30-240 mLs into feeding tube See admin instructions. Flush feeding tube with 240 mls of water  twice daily between feedings, flush with 60 ml of water  before and after initiating feedings &  Flush peg tube with 30 cc of water  before and after medication administration    [provider]      Allergies    Hydrocodone     Review of Systems   Review of Systems  Physical Exam Updated Vital Signs BP 111/86   Pulse 75   Temp 99.2 F  (37.3 C) (Axillary)   Resp 14   Ht 5\' 3"  (1.6 m)   Wt 87.6 kg   SpO2 95%   BMI 34.21 kg/m  Physical Exam Vitals and nursing note reviewed.  Constitutional:      General: She is not in acute distress.    Appearance: She is well-developed.  HENT:     Head: Normocephalic and atraumatic.  Eyes:     Conjunctiva/sclera: Conjunctivae normal.  Cardiovascular:     Rate and Rhythm: Normal rate and regular rhythm.     Heart sounds: No murmur heard. Pulmonary:     Effort: Pulmonary effort is normal. No respiratory distress.     Breath sounds: Normal breath sounds.  Abdominal:     Palpations: Abdomen is soft.     Tenderness: There is no abdominal tenderness.     Comments: PEG site with no erythema tenderness or crepitus  Musculoskeletal:        General: No swelling.     Cervical back: Neck supple.  Skin:    General: Skin is warm and dry.     Capillary Refill: Capillary refill takes less than 2 seconds.  Neurological:     Mental Status: She is alert. Mental status is at baseline.     ED Results / Procedures / Treatments   Labs (all labs ordered are listed, but only abnormal results are displayed) Labs Reviewed - No data to display  EKG None  Radiology DG ABDOMEN PEG TUBE LOCATION Result Date: 10/05/2023 CLINICAL DATA:  [id number] S/P percutaneous endoscopic gastrostomy (PEG) tube placement [facility]) [id number] EXAM: ABDOMEN - 1 VIEW COMPARISON:  July 02, 2023 FINDINGS: After the hand injection of enteric contrast, there is opacification of the stomach rugae. The retention balloon was not well visualized or evaluated. Nonobstructive bowel gas pattern. Multilevel thoracic osteophytosis. IMPRESSION: Percutaneous gastrostomy tube terminates in the stomach. Please note, the gastric retention balloon was not visualized or well evaluated for intraluminal positioning. Electronically Signed   By: Rance Burrows M.D.   On: 10/05/2023 18:21    Procedures .Gastrostomy tube  replacement  Date/Time: 10/05/2023 4:54 PM  Performed by: Aimee Houseman, PA-C Authorized by: Aimee Houseman, PA-C  Consent: Verbal consent obtained. Consent given by: Patient's son. Patient identity confirmed: arm band Time out: Immediately prior to procedure a "time out" was called to verify the correct patient, procedure, equipment, support staff and site/side marked as required. Preparation: Patient was prepped and draped in the usual sterile fashion. Local anesthesia used: no  Anesthesia: Local anesthesia used: no  Sedation: Patient sedated: no  Patient tolerance: patient tolerated the procedure well with no immediate complications Comments: 35 French PEG tube tube placed after inability to pass 20 French PEG tube       Medications Ordered in ED  Medications  diatrizoate  meglumine -sodium (GASTROGRAFIN ) 66-10 % solution 60 mL (60 mLs Oral Given 10/05/23 1719)    ED Course/ Medical Decision Making/ A&P                                 Medical Decision Making Differential diagnosis includes but not limited to displacement of gastrostomy tube, abdominal wall infection, other  Course: Patient presents after having displaced gastrostomy tube and nursing home could not get it in.  I attempted to replace with the 101 [place] which is what she had previously and was not able to place this.  Unsure of how long the tube was out.  I was able to easily place an 30 French gastrostomy tube.  I obtained x-ray with Gastrografin  which shows that the tube terminates in the stomach with opacification of the stomach rugae.  I agree with radiology read.  They note that the gastric retention balloon was not visualized or well-evaluated for intraluminal positioning.  Upon placement, tube was placed, balloon was inflated with 20 cc of sterile water  and tube was pulled back to ensure it would not displace and it was secure.  Do not feel patient needs further evaluation at this time.  She is sleeping  comfortably, no signs of discomfort, advised on outpatient follow-up and strict return precautions.  Amount and/or Complexity of Data Reviewed Radiology: ordered.  Risk Prescription drug management.           Final Clinical Impression(s) / ED Diagnoses Final diagnoses:  Attention to gastrostomy [facility])    Rx / DC Orders ED Discharge Orders     None         Joshua Nieves 10/05/23 1840    Cheyenne Cotta, MD 10/06/23 1253

## 2023-10-05 NOTE — ED Notes (Signed)
 Attempted to call [facility] x3. No answer. Patient discharged. Paperwork given to EMS to go with patient/RN at facility. VSS. Patient leaving with EMS.

## 2023-11-28 ENCOUNTER — Other Ambulatory Visit (HOSPITAL_COMMUNITY): Payer: Self-pay

## 2023-11-28 DIAGNOSIS — R1084 Generalized abdominal pain: Secondary | ICD-10-CM

## 2023-11-29 ENCOUNTER — Ambulatory Visit (HOSPITAL_COMMUNITY)
Admission: RE | Admit: 2023-11-29 | Discharge: 2023-11-29 | Disposition: A | Discharge status: Home / Self Care | Source: Ambulatory Visit

## 2023-11-29 DIAGNOSIS — R1084 Generalized abdominal pain: Secondary | ICD-10-CM | POA: Diagnosis present

## 2023-11-29 LAB — POCT I-STAT CREATININE: Creatinine, Ser: 0.7 mg/dL (ref 0.44–1.00)

## 2023-11-29 MED ORDER — IOHEXOL 300 MG/ML  SOLN
100.0000 mL | Freq: Once | INTRAMUSCULAR | Status: AC | PRN
Start: 2023-11-29 — End: 2023-11-29
  Administered 2023-11-29: 100 mL via INTRAVENOUS

## 2024-02-04 ENCOUNTER — Emergency Department (HOSPITAL_COMMUNITY)

## 2024-02-04 ENCOUNTER — Other Ambulatory Visit: Payer: Self-pay

## 2024-02-04 ENCOUNTER — Encounter (HOSPITAL_COMMUNITY): Payer: Self-pay | Admitting: Emergency Medicine

## 2024-02-04 ENCOUNTER — Emergency Department (HOSPITAL_COMMUNITY)
Admission: EM | Admit: 2024-02-04 | Discharge: 2024-02-04 | Disposition: A | Discharge status: Home / Self Care | Source: Skilled Nursing Facility | Attending: Emergency Medicine | Admitting: Emergency Medicine

## 2024-02-04 DIAGNOSIS — K9423 Gastrostomy malfunction: Secondary | ICD-10-CM | POA: Diagnosis present

## 2024-02-04 DIAGNOSIS — T85528A Displacement of other gastrointestinal prosthetic devices, implants and grafts, initial encounter: Secondary | ICD-10-CM | POA: Diagnosis not present

## 2024-02-04 DIAGNOSIS — Z8673 Personal history of transient ischemic attack (TIA), and cerebral infarction without residual deficits: Secondary | ICD-10-CM | POA: Diagnosis not present

## 2024-02-04 MED ORDER — DIATRIZOATE MEGLUMINE & SODIUM 66-10 % PO SOLN
30.0000 mL | Freq: Once | ORAL | Status: AC
  Administered 2024-02-04: 30 mL
  Filled 2024-02-04: qty 30

## 2024-02-04 NOTE — ED Triage Notes (Addendum)
 Pt from [facility] for replacement of Peg tube that staff reported had fallen out. Unknown how long Peg tube was out as staff found it when they were attempting to give am tube feeds. Pt non-verbal and bed bound at baseline.

## 2024-02-04 NOTE — Consult Note (Signed)
 Reason for Consult: PEG tube dislodgment Referring Physician: Dr. Dean Ivanoff Denise Savage is an 63 y.o. female.  HPI: Patient is a 63 year old white female with multiple medical problems who has had an indwelling gastrostomy tube for several years.  She presented to the emergency room with dislodgment of the PEG tube.  There was some difficulty replacing the gastrostomy tube at bedside and I was requested to confirm placement.  Past Medical History:  Diagnosis Date   Abdominal hernia    Anxiety    Depression    Dysphagia    G tube feedings (HCC)    G tube feedings (HCC)    Gastrostomy status (HCC)    GERD (gastroesophageal reflux disease)    Hemiplegia and hemiparesis following cerebral infarction affecting right dominant side (HCC)    Non-verbal learning disorder    Pulmonary embolus (HCC)    Seizure (HCC)    Seizures (HCC)    Stroke [facility]    Wheelchair dependent     Past Surgical History:  Procedure Laterality Date   CRANIOPLASTY Left 03/23/2021   Procedure: CRANIOPLASTY, HARVEST ABDOMINAL BONE FLAP;  Surgeon: Lanis Pupa, MD;  Location: [facility] OR;  Service: Neurosurgery;  Laterality: Left;   CRANIOTOMY Left 06/19/2020   Procedure: CRANIOTOMY HEMATOMA EVACUATION SUBDURAL;  Surgeon: Cheryle Debby LABOR, MD;  Location: [facility] OR;  Service: Neurosurgery;  Laterality: Left;   ESOPHAGOGASTRODUODENOSCOPY N/A 07/01/2020   Procedure: ESOPHAGOGASTRODUODENOSCOPY (EGD);  Surgeon: Paola Dreama SAILOR, MD;  Location: [facility] ENDOSCOPY;  Service: General;  Laterality: N/A;   GASTROSTOMY  01/27/2022   IR ANGIO INTRA EXTRACRAN SEL INTERNAL CAROTID BILAT MOD SED  06/16/2020   IR ANGIO VERTEBRAL SEL VERTEBRAL UNI R MOD SED  06/16/2020   IR ANGIOGRAM FOLLOW UP STUDY  06/16/2020   IR ANGIOGRAM FOLLOW UP STUDY  06/16/2020   IR ANGIOGRAM FOLLOW UP STUDY  06/16/2020   IR CT HEAD LTD  06/16/2020   IR NEURO EACH ADD'L AFTER BASIC UNI RIGHT (MS)  06/16/2020   IR PATIENT EVAL TECH 0-60 MINS  01/24/2022   IR RADIOLOGIST EVAL &  MGMT  01/24/2022   IR REPLC GASTRO/COLONIC TUBE PERCUT W/FLUORO  10/03/2022   IR TRANSCATH/EMBOLIZ  06/16/2020   IR US  GUIDE VASC ACCESS RIGHT  06/16/2020   PEG PLACEMENT N/A 07/01/2020   Procedure: PERCUTANEOUS ENDOSCOPIC GASTROSTOMY (PEG) PLACEMENT;  Surgeon: Paola Dreama SAILOR, MD;  Location: [facility] ENDOSCOPY;  Service: General;  Laterality: N/A;   RADIOLOGY WITH ANESTHESIA N/A 06/16/2020   Procedure: IR WITH ANESTHESIA;  Surgeon: Lanis Pupa, MD;  Location: [facility] OR;  Service: Radiology;  Laterality: N/A;   TUBAL LIGATION     VENTRAL HERNIA REPAIR N/A 12/29/2014   Procedure: LAPAROSCOPIC VENTRAL HERNIA WITH MESH;  Surgeon: Oneil Budge, MD;  Location: [facility] ORS;  Service: General;  Laterality: N/A;    Family History  Problem Relation Age of Onset   Pancreatitis Neg Hx     Social History:  reports that she has never smoked. She has never used smokeless tobacco. She reports that she does not drink alcohol and does not use drugs.  Allergies:  Allergies  Allergen Reactions   Hydrocodone  Nausea Only    Medications: I have reviewed the patient's current medications.  No results found for this or any previous visit (from the past 48 hours).  DG ABDOMEN PEG TUBE LOCATION Result Date: 02/04/2024 CLINICAL DATA:  Peg tube placement. EXAM: ABDOMEN - 1 VIEW COMPARISON:  02/04/2024 FINDINGS: Contrast material is identified in the lumen of the  stomach coursing through the pylorus into the proximal duodenum. The actual gastrostomy tube is not well demonstrated. There is diffuse opacification of nondilated small bowel throughout the abdomen and pelvis. Sequelae of prior mesh placement again noted. IMPRESSION: Contrast material is identified in the lumen of the stomach coursing through the pylorus into the proximal duodenum. The actual gastrostomy tube is not well demonstrated. Assuming contrast injection through the gastrostomy tube, this would be consistent with intraluminal placement of the gastric tube tip.  Given the diffuse opacification of small bowel through the abdomen and pelvis, leak of contrast out of the stomach from injection could be obscured. Electronically Signed   By: Camellia Candle M.D.   On: 02/04/2024 08:55   DG ABDOMEN PEG TUBE LOCATION Addendum Date: 02/04/2024 ADDENDUM REPORT: 02/04/2024 08:01 ADDENDUM: Critical Value/emergent results were called by telephone at the time of interpretation on 02/04/2024 at 8:00 am to provider DR. HAVILAND, who verbally acknowledged these results. Electronically Signed   By: Francis Quam M.D.   On: 02/04/2024 08:01   Result Date: 02/04/2024 CLINICAL DATA:  [id number]. Injected contrast for gastrostomy tube placement verification. EXAM: DG ABDOMEN 1V COMPARISON:  Study of 10/05/2023 FINDINGS: 30 mL Gastrografin  was injected into the indwelling PEG tube. The PEG retention balloon is not well visualized but there is contrast outlining what is believed to be distal antral rugal folds and a portion of the descending duodenum. Some of the contrast appears to have back-tracked retrograde to the skin surface, and intraperitoneal extravasation of a small volume is not excluded. The bowel pattern is nonobstructive. There are postsurgical changes of the abdominal wall. Osteopenia degenerative change of the spine and hips. IMPRESSION: 1. Contrast outlining what is believed to be distal antral rugal folds and a portion of the descending duodenum. The PEG tube does appear to terminate in the stomach but the retention balloon is not well evaluated. 2. Some of the contrast appears to have back-tracked retrograde to the skin surface, and intraperitoneal extravasation of a small volume is not excluded. 3. PRA is attempting to reach the ordering physician for notification of findings at the time of signing. Electronically Signed: By: Francis Quam M.D. On: 02/04/2024 07:57    ROS:  Review of systems not obtained due to patient factors.  Blood pressure 133/87, pulse 65,  temperature 98.7 F (37.1 C), temperature source Axillary, resp. rate 20, height 5' 3 (1.6 m), weight 88 kg, SpO2 100%. Physical Exam: Noncommunicative black female in no acute distress Abdomen soft with a 20 French gastrostomy tube in place in the left upper quadrant.  The gastrostomy tube was flushed with 60 cc of water  and gastric contents were aspirated.  KUB with contrast image reviewed  Assessment/Plan: Impression: Adequate repositioning of gastrostomy tube.  Oneil Budge 02/04/2024, 9:22 AM

## 2024-02-04 NOTE — ED Notes (Signed)
 Feeding tube out. EDP at bedside. Able to get 16 F foley in at this time.

## 2024-02-04 NOTE — ED Provider Notes (Signed)
 Pt signed out by Dr. Theadore pending Xray.  Xray reviewed by me.  I agree with the radiologist.  . Contrast outlining what is believed to be distal antral rugal  folds and a portion of the descending duodenum. The PEG tube does  appear to terminate in the stomach but the retention balloon is not  well evaluated.  2. Some of the contrast appears to have back-tracked retrograde to  the skin surface, and intraperitoneal extravasation of a small  volume is not excluded.  3. PRA is attempting to reach the ordering physician for  notification of findings at the time of signing.    There was a spill of contrast after injection, so I ordered another xray with me injecting to further evaluate the possibility of intraperitoneal extravasation.  This Xray read as: Contrast material is identified in the lumen of the stomach coursing  through the pylorus into the proximal duodenum. The actual  gastrostomy tube is not well demonstrated. Assuming contrast  injection through the gastrostomy tube, this would be consistent  with intraluminal placement of the gastric tube tip. Given the  diffuse opacification of small bowel through the abdomen and pelvis,  leak of contrast out of the stomach from injection could be  obscured.    Due to the continued question of extravasation, pt d/w Dr. Mavis (surgery) who came down to evaluate the tube.  He did evaluate it and feels it's in good position.   Dean Clarity, MD 02/04/24 [phone]

## 2024-02-04 NOTE — ED Provider Notes (Signed)
 [facility] Emergency Department Provider Note MRN:  [id number]  Arrival date & time: 02/04/24     Chief Complaint   G-tube dislodgment History of Present Illness   Denise Savage is a 63 y.o. year-old female with a history of stroke presenting to the ED with chief complaint of G-tube dislodgment.  G-tube fell out, sent here by facility.  Patient nonverbal, answers no questions.  Review of Systems  I was unable to obtain a full/accurate HPI, PMH, or ROS due to the patient's nonverbal status.  Patient's Health History    Past Medical History:  Diagnosis Date   Abdominal hernia    Anxiety    Depression    Dysphagia    G tube feedings (HCC)    G tube feedings (HCC)    Gastrostomy status (HCC)    GERD (gastroesophageal reflux disease)    Hemiplegia and hemiparesis following cerebral infarction affecting right dominant side (HCC)    Non-verbal learning disorder    Pulmonary embolus (HCC)    Seizure (HCC)    Seizures (HCC)    Stroke [facility])    Wheelchair dependent     Past Surgical History:  Procedure Laterality Date   CRANIOPLASTY Left 03/23/2021   Procedure: CRANIOPLASTY, HARVEST ABDOMINAL BONE FLAP;  Surgeon: Lanis Pupa, MD;  Location: [facility] OR;  Service: Neurosurgery;  Laterality: Left;   CRANIOTOMY Left 06/19/2020   Procedure: CRANIOTOMY HEMATOMA EVACUATION SUBDURAL;  Surgeon: Cheryle Debby LABOR, MD;  Location: [facility] OR;  Service: Neurosurgery;  Laterality: Left;   ESOPHAGOGASTRODUODENOSCOPY N/A 07/01/2020   Procedure: ESOPHAGOGASTRODUODENOSCOPY (EGD);  Surgeon: Paola Dreama SAILOR, MD;  Location: [facility] ENDOSCOPY;  Service: General;  Laterality: N/A;   GASTROSTOMY  01/27/2022   IR ANGIO INTRA EXTRACRAN SEL INTERNAL CAROTID BILAT MOD SED  06/16/2020   IR ANGIO VERTEBRAL SEL VERTEBRAL UNI R MOD SED  06/16/2020   IR ANGIOGRAM FOLLOW UP STUDY  06/16/2020   IR ANGIOGRAM FOLLOW UP STUDY  06/16/2020   IR ANGIOGRAM FOLLOW UP STUDY  06/16/2020   IR CT HEAD LTD  06/16/2020    IR NEURO EACH ADD'L AFTER BASIC UNI RIGHT (MS)  06/16/2020   IR PATIENT EVAL TECH 0-60 MINS  01/24/2022   IR RADIOLOGIST EVAL & MGMT  01/24/2022   IR REPLC GASTRO/COLONIC TUBE PERCUT W/FLUORO  10/03/2022   IR TRANSCATH/EMBOLIZ  06/16/2020   IR US  GUIDE VASC ACCESS RIGHT  06/16/2020   PEG PLACEMENT N/A 07/01/2020   Procedure: PERCUTANEOUS ENDOSCOPIC GASTROSTOMY (PEG) PLACEMENT;  Surgeon: Paola Dreama SAILOR, MD;  Location: [facility] ENDOSCOPY;  Service: General;  Laterality: N/A;   RADIOLOGY WITH ANESTHESIA N/A 06/16/2020   Procedure: IR WITH ANESTHESIA;  Surgeon: Lanis Pupa, MD;  Location: [facility] OR;  Service: Radiology;  Laterality: N/A;   TUBAL LIGATION     VENTRAL HERNIA REPAIR N/A 12/29/2014   Procedure: LAPAROSCOPIC VENTRAL HERNIA WITH MESH;  Surgeon: Oneil Budge, MD;  Location: [facility] ORS;  Service: General;  Laterality: N/A;    Family History  Problem Relation Age of Onset   Pancreatitis Neg Hx     Social History   Socioeconomic History   Marital status: Single    Spouse name: Not on file   Number of children: Not on file   Years of education: Not on file   Highest education level: Not on file  Occupational History   Not on file  Tobacco Use   Smoking status: Never   Smokeless tobacco: Never  Vaping Use   Vaping status: Never  Used  Substance and Sexual Activity   Alcohol use: No   Drug use: No   Sexual activity: Not Currently    Comment: tubal ligation  Other Topics Concern   Not on file  Social History Narrative   [employer]    Social Drivers of Health   Financial Resource Strain: Not on file  Food Insecurity: Patient Unable To Answer (08/24/2023)   Hunger Vital Sign    Worried About Running Out of Food in the Last Year: Patient unable to answer    Ran Out of Food in the Last Year: Patient unable to answer  Transportation Needs: Patient Unable To Answer (08/24/2023)   PRAPARE - Transportation    Lack of Transportation (Medical): Patient unable to answer    Lack of  Transportation (Non-Medical): Patient unable to answer  Physical Activity: Not on file  Stress: Not on file  Social Connections: Patient Unable To Answer (08/24/2023)   Social Connection and Isolation Panel    Frequency of Communication with Friends and Family: Patient unable to answer    Frequency of Social Gatherings with Friends and Family: Patient unable to answer    Attends Religious Services: Patient unable to answer    Active Member of Clubs or Organizations: Patient unable to answer    Attends Banker Meetings: Patient unable to answer    Marital Status: Patient unable to answer  Intimate Partner Violence: Patient Unable To Answer (08/24/2023)   Humiliation, Afraid, Rape, and Kick questionnaire    Fear of Current or Ex-Partner: Patient unable to answer    Emotionally Abused: Patient unable to answer    Physically Abused: Patient unable to answer    Sexually Abused: Patient unable to answer     Physical Exam   Vitals:   02/04/24 0550  BP: (!) 145/110  Pulse: 63  Temp: 98.7 F (37.1 C)  SpO2: 100%    CONSTITUTIONAL: Chronically ill-appearing, NAD NEURO/PSYCH:  Alert and oriented x 3, no focal deficits EYES:  eyes equal and reactive ENT/NECK:  no LAD, no JVD CARDIO: Regular rate, well-perfused, normal S1 and S2 PULM:  CTAB no wheezing or rhonchi GI/GU:  non-distended, non-tender MSK/SPINE:  No gross deformities, no edema SKIN:  no rash, atraumatic   *Additional and/or pertinent findings included in MDM below  Diagnostic and Interventional Summary    EKG Interpretation Date/Time:    Ventricular Rate:    PR Interval:    QRS Duration:    QT Interval:    QTC Calculation:   R Axis:      Text Interpretation:         Labs Reviewed - No data to display  DG ABDOMEN PEG TUBE LOCATION    (Results Pending)    Medications - No data to display   Procedures  /  Critical Care .Gastrostomy tube replacement  Date/Time: 02/04/2024 6:56 AM  Performed by:  Theadore Ozell HERO, MD Authorized by: Theadore Ozell HERO, MD  Patient identity confirmed: arm band Time out: Immediately prior to procedure a time out was called to verify the correct patient, procedure, equipment, support staff and site/side marked as required. Preparation: Patient was prepped and draped in the usual sterile fashion. Patient tolerance: patient tolerated the procedure well with no immediate complications Comments: 41 French G-tube placed, 20 cc balloon     ED Course and Medical Decision Making  Initial Impression and Ddx G-tube dislodgment, has had prior presentations for the same.  Unable to insert 20 [place], unable  to insert 18 [place].  Able to place a 16 French red rubber to hold open the stoma.  After 30 minutes was able to insert 16 [place] G-tube.  Awaiting x-ray to confirm placement.  Past medical/surgical history that increases complexity of ED encounter: Stroke  Interpretation of Diagnostics X-ray pending  Patient Reassessment and Ultimate Disposition/Management     Signed out to oncoming provider at shift change.  Patient management required discussion with the following services or consulting groups:  None  Complexity of Problems Addressed Acute illness or injury that poses threat of life of bodily function  Additional Data Reviewed and Analyzed Further history obtained from: Prior labs/imaging results  Additional Factors Impacting ED Encounter Risk Minor Procedures  Ozell HERO. Theadore, MD [facility] Emergency Medicine [facility] [email or web address]  Final Clinical Impressions(s) / ED Diagnoses     ICD-10-CM   1. Gastrojejunostomy tube dislodgement  U14.471J       ED Discharge Orders     None        Discharge Instructions Discussed with and Provided to Patient:   Discharge Instructions   None      Theadore Ozell HERO, MD 02/04/24 [phone]

## 2024-03-03 ENCOUNTER — Encounter (HOSPITAL_COMMUNITY): Payer: Self-pay

## 2024-03-03 ENCOUNTER — Other Ambulatory Visit: Payer: Self-pay

## 2024-03-03 ENCOUNTER — Emergency Department (HOSPITAL_COMMUNITY)
Admission: EM | Admit: 2024-03-03 | Discharge: 2024-03-04 | Disposition: A | Discharge status: Skilled Nursing Facility | Source: Skilled Nursing Facility | Attending: Emergency Medicine | Admitting: Emergency Medicine

## 2024-03-03 DIAGNOSIS — Z79899 Other long term (current) drug therapy: Secondary | ICD-10-CM | POA: Diagnosis not present

## 2024-03-03 DIAGNOSIS — Z931 Gastrostomy status: Secondary | ICD-10-CM | POA: Insufficient documentation

## 2024-03-03 MED ORDER — ISOSOURCE 1.5 CAL PO LIQD: 237.0000 mL | Freq: Every day | ORAL | Status: DC

## 2024-03-03 MED ORDER — DEXTROMETHORPHAN-QUINIDINE 20-10 MG PO CAPS: 1.0000 | ORAL_CAPSULE | Freq: Two times a day (BID) | ORAL | Status: DC

## 2024-03-03 MED ORDER — HYDROXYZINE HCL 10 MG/5ML PO SYRP: 25.0000 mg | ORAL_SOLUTION | Freq: Four times a day (QID) | ORAL | Status: DC | PRN

## 2024-03-03 MED ORDER — GLYCOPYRROLATE 1 MG PO TABS: 1.0000 mg | ORAL_TABLET | Freq: Three times a day (TID) | ORAL | Status: DC | PRN

## 2024-03-03 MED ORDER — METOPROLOL TARTRATE 25 MG PO TABS
25.0000 mg | ORAL_TABLET | Freq: Two times a day (BID) | ORAL | Status: DC
  Administered 2024-03-03: 25 mg via ORAL
  Filled 2024-03-03: qty 1

## 2024-03-03 MED ORDER — ACETAMINOPHEN 325 MG PO TABS: 650.0000 mg | ORAL_TABLET | Freq: Four times a day (QID) | ORAL | Status: DC | PRN

## 2024-03-03 MED ORDER — BACLOFEN 25 MG/5ML PO SUSP: 25.0000 mg | Freq: Two times a day (BID) | ORAL | Status: DC

## 2024-03-03 MED ORDER — TRAZODONE HCL 50 MG PO TABS
75.0000 mg | ORAL_TABLET | Freq: Every day | ORAL | Status: DC
  Administered 2024-03-03: 75 mg via ORAL
  Filled 2024-03-03: qty 2

## 2024-03-03 MED ORDER — LACOSAMIDE 10 MG/ML PO SOLN
100.0000 mg | Freq: Two times a day (BID) | ORAL | Status: DC
  Administered 2024-03-03: 100 mg via ORAL
  Filled 2024-03-03: qty 10

## 2024-03-03 MED ORDER — MELATONIN 3 MG PO TABS
9.0000 mg | ORAL_TABLET | Freq: Every day | ORAL | Status: DC
  Administered 2024-03-03: 9 mg via ORAL
  Filled 2024-03-03: qty 3

## 2024-03-03 MED ORDER — LORAZEPAM 1 MG PO TABS
1.0000 mg | ORAL_TABLET | Freq: Three times a day (TID) | ORAL | Status: DC
  Administered 2024-03-03: 1 mg
  Filled 2024-03-03: qty 1

## 2024-03-03 MED ORDER — VALPROIC ACID 250 MG/5ML PO SOLN
750.0000 mg | Freq: Three times a day (TID) | ORAL | Status: DC
  Administered 2024-03-03: 750 mg
  Filled 2024-03-03 (×2): qty 15

## 2024-03-03 NOTE — ED Provider Notes (Signed)
 [place] EMERGENCY DEPARTMENT AT [facility] Provider Note   CSN: [id number] Arrival date & time: 03/03/24  8365     Patient presents with: G Tube Placement Verification   Denise Savage is a 63 y.o. female.   Patient arrives from [facility] for evaluation of PEG tube. Per staff at [facility], the external portion of the tube appeared significantly shortened and was deeper in the abdomen. It was noticed today at time for feeding by the nurse. He reported that it flushed well but appeared different so she was sent to the ED for evaluation of tube patency. The patient is nonverbal at baseline.  The history is provided by the EMS personnel and the nursing home. No language interpreter was used.       Prior to Admission medications   Medication Sig Start Date End Date Taking? Authorizing Provider  acetaminophen  (TYLENOL ) 325 MG tablet Place 650 mg into feeding tube in the morning, at noon, in the evening, and at bedtime. (0000, 0600, 1200, 1800)    [provider]  antiseptic oral rinse (CPC / CETYLPYRIDINIUM CHLORIDE 0.05%) 0.05 % LIQD solution 15 mLs by Mouth Rinse route in the morning, at noon, in the evening, and at bedtime. For mouth care use mouth swabs to clean out mouth    [provider]  baclofen  (FLEQSUVY ) 25 MG/5ML SUSP oral suspension  06/09/23   [provider]  cholecalciferol (VITAMIN D3) 25 MCG (1000 UNIT) tablet Take 1,000 Units by mouth daily.    [provider]  Dextromethorphan -quiNIDine (NUEDEXTA ) 20-10 MG capsule 1 capsule every 12 (twelve) hours. Via G-tube    [provider]  famotidine  (PEPCID ) 20 MG tablet Take 20 mg by mouth 2 (two) times daily.    [provider]  fiber supplement, BANATROL TF, liquid Place 60 mLs into feeding tube 3 (three) times daily. 02/03/23   Sherrill Cable Latif, DO  glycopyrrolate  (ROBINUL ) 1 MG tablet Take 1 mg by mouth every 8 (eight) hours as needed (increased  secretions).    [provider]  hydrOXYzine  (ATARAX ) 10 MG/5ML syrup Place 12.5 mLs (25 mg total) into feeding tube every 6 (six) hours as needed for anxiety. 02/03/23   Sherrill Cable Latif, DO  lacosamide  (VIMPAT ) 10 MG/ML oral solution Take 10 mLs by mouth 2 (two) times daily. 03/13/23   [provider]  LORazepam  (ATIVAN ) 1 MG tablet Place 1 tablet (1 mg total) into feeding tube in the morning, at noon, in the evening, and at bedtime. (0000, 0600, 1200, 1800) 08/29/23   Ricky Fines, MD  Melatonin 10 MG TABS Take 10 mg by mouth at bedtime. (2100)    [provider]  metoprolol  tartrate (LOPRESSOR ) 25 MG tablet Take 1 tablet (25 mg total) by mouth 2 (two) times daily. 08/29/23   Ricky Fines, MD  Multiple Vitamin (MULTIVITAMIN WITH MINERALS) TABS tablet Take 1 tablet by mouth daily.    [provider]  Nutritional Supplements (ISOSOURCE 1.5 CAL) LIQD Place 237 mLs into feeding tube 5 (five) times daily. (0600, 1000, 1400, 1800 & 2200)    [provider]  polyethylene glycol (MIRALAX  / GLYCOLAX ) 17 g packet Take 17 g by mouth daily as needed. 08/29/23   Ricky Fines, MD  tizanidine  (ZANAFLEX ) 2 MG capsule Take 1 capsule (2 mg total) by mouth 3 (three) times daily as needed for muscle spasms. 08/29/23   Ricky Fines, MD  traZODone  (DESYREL ) 50 MG tablet Take 75 mg by mouth  at bedtime. (2200) 02/21/21   [provider]  valproic  acid (DEPAKENE ) 250 MG/5ML solution Place 15 mLs (750 mg total) into feeding tube 3 (three) times daily. 02/03/23   Sherrill Cable Latif, DO  Water  For Irrigation, Sterile (FREE WATER ) SOLN Place 30-240 mLs into feeding tube See admin instructions. Flush feeding tube with 240 mls of water  twice daily between feedings, flush with 60 ml of water  before and after initiating feedings &  Flush peg tube with 30 cc of water  before and after medication administration    [provider]    Allergies: Hydrocodone     Review  of Systems  Updated Vital Signs BP (!) 146/83   Pulse (!) 53   Temp 98.6 F (37 C) (Axillary)   Resp 16   Ht 5' 3 (1.6 m)   Wt 88 kg   SpO2 99%   BMI 34.37 kg/m   Physical Exam Vitals and nursing note reviewed.  Constitutional:      Comments: Patient is not verbally responsive, baseline.  Cardiovascular:     Rate and Rhythm: Normal rate.  Pulmonary:     Effort: Pulmonary effort is normal.  Abdominal:     Comments: PEG tube in place. Site unremarkable. No redness. No abdominal distention.  Skin:    General: Skin is warm and dry.     (all labs ordered are listed, but only abnormal results are displayed) Labs Reviewed - No data to display  EKG: None  Radiology: No results found.   Procedures   Medications Ordered in the ED - No data to display  Clinical Course as of 03/03/24 1759  Sun Mar 03, 2024  1757 Patient arrives from [facility] for evaluation of PEG tube which is reported to have moved further into the abdomen.   The tube flushes without difficulty. Gastric contents returns easily. Site without infection or evidence of trauma.   The patient can be discharged back to the facility. [SU]    Clinical Course User Index [SU] Odell Balls, PA-C                                 Medical Decision Making       Final diagnoses:  PEG (percutaneous endoscopic gastrostomy) status [facility])    ED Discharge Orders     None          Odell Balls, PA-C 03/03/24 1759    Cleotilde Rogue, MD 03/05/24 [phone]

## 2024-03-03 NOTE — ED Notes (Addendum)
 Assisted Charge Nurse with flushing G-tube food and meds the re-covered area with new 4x4 guaze. G-Tube flushed well with gastric fluids returned.

## 2024-03-03 NOTE — ED Notes (Signed)
 G-tube placement checked with aspiration and gastric fluid noted. G-tube was able to free flow water  in meds and liquid sites. New dressing applied around g- tube.

## 2024-03-03 NOTE — ED Triage Notes (Signed)
 Pt BIB [facility] from [facility] for G tube placement verification. Insertion area appears clean and dry. Pt is nonverbal and non ambulatory.

## 2024-03-03 NOTE — Discharge Instructions (Signed)
 No malfunction of the feeding tube is identified.

## 2024-03-22 ENCOUNTER — Other Ambulatory Visit (HOSPITAL_COMMUNITY): Payer: Self-pay | Admitting: Family Medicine

## 2024-03-22 DIAGNOSIS — R1115 Cyclical vomiting syndrome unrelated to migraine: Secondary | ICD-10-CM

## 2024-03-27 ENCOUNTER — Other Ambulatory Visit: Payer: Self-pay

## 2024-03-27 ENCOUNTER — Ambulatory Visit (HOSPITAL_COMMUNITY): Admission: RE | Admit: 2024-03-27 | Discharge: 2024-03-27 | Attending: Family Medicine | Admitting: Family Medicine

## 2024-03-27 ENCOUNTER — Inpatient Hospital Stay (HOSPITAL_COMMUNITY)
Admission: EM | Admit: 2024-03-27 | Discharge: 2024-03-30 | DRG: 871 | Disposition: A | Discharge status: Skilled Nursing Facility | Source: Skilled Nursing Facility | Attending: Internal Medicine | Admitting: Internal Medicine

## 2024-03-27 ENCOUNTER — Other Ambulatory Visit (HOSPITAL_COMMUNITY): Payer: Self-pay | Admitting: Family Medicine

## 2024-03-27 ENCOUNTER — Emergency Department (HOSPITAL_COMMUNITY)

## 2024-03-27 DIAGNOSIS — I69251 Hemiplegia and hemiparesis following other nontraumatic intracranial hemorrhage affecting right dominant side: Secondary | ICD-10-CM

## 2024-03-27 DIAGNOSIS — Z87440 Personal history of urinary (tract) infections: Secondary | ICD-10-CM

## 2024-03-27 DIAGNOSIS — R569 Unspecified convulsions: Secondary | ICD-10-CM

## 2024-03-27 DIAGNOSIS — Z7401 Bed confinement status: Secondary | ICD-10-CM

## 2024-03-27 DIAGNOSIS — Z8619 Personal history of other infectious and parasitic diseases: Secondary | ICD-10-CM | POA: Diagnosis present

## 2024-03-27 DIAGNOSIS — I69391 Dysphagia following cerebral infarction: Secondary | ICD-10-CM

## 2024-03-27 DIAGNOSIS — L8990 Pressure ulcer of unspecified site, unspecified stage: Secondary | ICD-10-CM | POA: Diagnosis present

## 2024-03-27 DIAGNOSIS — D696 Thrombocytopenia, unspecified: Secondary | ICD-10-CM | POA: Diagnosis present

## 2024-03-27 DIAGNOSIS — Z6830 Body mass index (BMI) 30.0-30.9, adult: Secondary | ICD-10-CM

## 2024-03-27 DIAGNOSIS — L89896 Pressure-induced deep tissue damage of other site: Secondary | ICD-10-CM | POA: Diagnosis present

## 2024-03-27 DIAGNOSIS — E43 Unspecified severe protein-calorie malnutrition: Secondary | ICD-10-CM | POA: Diagnosis present

## 2024-03-27 DIAGNOSIS — Z86711 Personal history of pulmonary embolism: Secondary | ICD-10-CM

## 2024-03-27 DIAGNOSIS — R1115 Cyclical vomiting syndrome unrelated to migraine: Secondary | ICD-10-CM

## 2024-03-27 DIAGNOSIS — T85528A Displacement of other gastrointestinal prosthetic devices, implants and grafts, initial encounter: Secondary | ICD-10-CM | POA: Diagnosis present

## 2024-03-27 DIAGNOSIS — Z1152 Encounter for screening for COVID-19: Secondary | ICD-10-CM

## 2024-03-27 DIAGNOSIS — G40909 Epilepsy, unspecified, not intractable, without status epilepticus: Secondary | ICD-10-CM | POA: Diagnosis present

## 2024-03-27 DIAGNOSIS — Z66 Do not resuscitate: Secondary | ICD-10-CM | POA: Diagnosis present

## 2024-03-27 DIAGNOSIS — A419 Sepsis, unspecified organism: Principal | ICD-10-CM | POA: Diagnosis present

## 2024-03-27 DIAGNOSIS — G9340 Encephalopathy, unspecified: Secondary | ICD-10-CM

## 2024-03-27 DIAGNOSIS — Z931 Gastrostomy status: Secondary | ICD-10-CM | POA: Insufficient documentation

## 2024-03-27 DIAGNOSIS — Z885 Allergy status to narcotic agent status: Secondary | ICD-10-CM

## 2024-03-27 DIAGNOSIS — Z9851 Tubal ligation status: Secondary | ICD-10-CM

## 2024-03-27 DIAGNOSIS — Y848 Other medical procedures as the cause of abnormal reaction of the patient, or of later complication, without mention of misadventure at the time of the procedure: Secondary | ICD-10-CM | POA: Diagnosis present

## 2024-03-27 DIAGNOSIS — G8191 Hemiplegia, unspecified affecting right dominant side: Secondary | ICD-10-CM | POA: Insufficient documentation

## 2024-03-27 DIAGNOSIS — D638 Anemia in other chronic diseases classified elsewhere: Secondary | ICD-10-CM | POA: Diagnosis present

## 2024-03-27 DIAGNOSIS — D649 Anemia, unspecified: Secondary | ICD-10-CM | POA: Diagnosis present

## 2024-03-27 DIAGNOSIS — L89152 Pressure ulcer of sacral region, stage 2: Secondary | ICD-10-CM | POA: Diagnosis present

## 2024-03-27 DIAGNOSIS — L03113 Cellulitis of right upper limb: Secondary | ICD-10-CM | POA: Diagnosis present

## 2024-03-27 DIAGNOSIS — R509 Fever, unspecified: Principal | ICD-10-CM | POA: Diagnosis present

## 2024-03-27 DIAGNOSIS — I69320 Aphasia following cerebral infarction: Secondary | ICD-10-CM

## 2024-03-27 DIAGNOSIS — Z993 Dependence on wheelchair: Secondary | ICD-10-CM

## 2024-03-27 DIAGNOSIS — I69351 Hemiplegia and hemiparesis following cerebral infarction affecting right dominant side: Secondary | ICD-10-CM

## 2024-03-27 DIAGNOSIS — E876 Hypokalemia: Secondary | ICD-10-CM | POA: Diagnosis present

## 2024-03-27 DIAGNOSIS — I6930 Unspecified sequelae of cerebral infarction: Secondary | ICD-10-CM

## 2024-03-27 DIAGNOSIS — Z79899 Other long term (current) drug therapy: Secondary | ICD-10-CM

## 2024-03-27 DIAGNOSIS — K9423 Gastrostomy malfunction: Secondary | ICD-10-CM | POA: Diagnosis present

## 2024-03-27 LAB — COMPREHENSIVE METABOLIC PANEL WITH GFR
ALT: <5 U/L (ref 0–44)
AST: 10 U/L — ABNORMAL LOW (ref 15–41)
Albumin: 3.1 g/dL — ABNORMAL LOW (ref 3.5–5.0)
Alkaline Phosphatase: 67 U/L (ref 38–126)
Anion gap: 9 (ref 5–15)
BUN: 9 mg/dL (ref 8–23)
CO2: 25 mmol/L (ref 22–32)
Calcium: 10.5 mg/dL — ABNORMAL HIGH (ref 8.9–10.3)
Chloride: 101 mmol/L (ref 98–111)
Creatinine, Ser: 0.49 mg/dL (ref 0.44–1.00)
GFR, Estimated: >60 mL/min (ref >60)
Glucose, Bld: 86 mg/dL (ref 70–99)
Potassium: 3.4 mmol/L — ABNORMAL LOW (ref 3.5–5.1)
Sodium: 136 mmol/L (ref 135–145)
Total Bilirubin: 0.5 mg/dL (ref 0.0–1.2)
Total Protein: 6.4 g/dL — ABNORMAL LOW (ref 6.5–8.1)

## 2024-03-27 LAB — CBC WITH DIFFERENTIAL/PLATELET
Abs Immature Granulocytes: 0.03 K/uL (ref 0.00–0.07)
Basophils Absolute: 0 K/uL (ref 0.0–0.1)
Basophils Relative: 0 %
Eosinophils Absolute: 0 K/uL (ref 0.0–0.5)
Eosinophils Relative: 0 %
HCT: 34.4 % — ABNORMAL LOW (ref 36.0–46.0)
Hemoglobin: 10.8 g/dL — ABNORMAL LOW (ref 12.0–15.0)
Immature Granulocytes: 0 %
Lymphocytes Relative: 15 %
Lymphs Abs: 1.3 K/uL (ref 0.7–4.0)
MCH: 28.8 pg (ref 26.0–34.0)
MCHC: 31.4 g/dL (ref 30.0–36.0)
MCV: 91.7 fL (ref 80.0–100.0)
Monocytes Absolute: 1 K/uL (ref 0.1–1.0)
Monocytes Relative: 12 %
Neutro Abs: 6.2 K/uL (ref 1.7–7.7)
Neutrophils Relative %: 73 %
Platelets: 144 K/uL — ABNORMAL LOW (ref 150–400)
RBC: 3.75 MIL/uL — ABNORMAL LOW (ref 3.87–5.11)
RDW: 15.1 % (ref 11.5–15.5)
WBC: 8.6 K/uL (ref 4.0–10.5)
nRBC: 0 % (ref 0.0–0.2)

## 2024-03-27 LAB — LIPASE, BLOOD: Lipase: 15 U/L (ref 11–51)

## 2024-03-27 LAB — I-STAT CG4 LACTIC ACID, ED: Lactic Acid, Venous: 0.9 mmol/L (ref 0.5–1.9)

## 2024-03-27 MED ORDER — METRONIDAZOLE 500 MG/100ML IV SOLN
500.0000 mg | Freq: Once | INTRAVENOUS | Status: AC
  Administered 2024-03-27: 500 mg via INTRAVENOUS
  Filled 2024-03-27: qty 100

## 2024-03-27 MED ORDER — ACETAMINOPHEN 650 MG RE SUPP
650.0000 mg | Freq: Once | RECTAL | Status: DC
  Filled 2024-03-27: qty 1

## 2024-03-27 MED ORDER — IOHEXOL 300 MG/ML  SOLN: 100.0000 mL | Freq: Once | INTRAMUSCULAR | Status: DC | PRN

## 2024-03-27 MED ORDER — VANCOMYCIN HCL 1750 MG/350ML IV SOLN
1750.0000 mg | Freq: Once | INTRAVENOUS | Status: AC
Start: 2024-03-27 — End: 2024-03-28
  Administered 2024-03-28: 1750 mg via INTRAVENOUS
  Filled 2024-03-27: qty 350

## 2024-03-27 MED ORDER — ACETAMINOPHEN 325 MG PO TABS: 650.0000 mg | ORAL_TABLET | Freq: Once | ORAL | Status: DC | PRN

## 2024-03-27 MED ORDER — SODIUM CHLORIDE 0.9 % IV SOLN
2.0000 g | Freq: Three times a day (TID) | INTRAVENOUS | Status: DC
  Administered 2024-03-27 – 2024-03-29 (×5): 2 g via INTRAVENOUS
  Filled 2024-03-27 (×6): qty 12.5

## 2024-03-27 MED ORDER — PIPERACILLIN-TAZOBACTAM 3.375 G IVPB 30 MIN
3.3750 g | Freq: Once | INTRAVENOUS | Status: DC
  Filled 2024-03-27: qty 50

## 2024-03-27 NOTE — Progress Notes (Signed)
 Tried to place PIV 2x in LFA with no success. Patient is contracted and in a chair.  Midline in right upper arm looks infiltrated. Redness, swelling and warm to touch observed. Dead end cap place to prevent further use of midline. Advised to remove midline and treat possible infiltration/extravasation/phlebitis.

## 2024-03-27 NOTE — Progress Notes (Signed)
 Unable to place vascular access due to bilateral arm contractors. When left arm is moved, pt begins to experience discomfort. Inflammation and swelling surrounding right upper arm midline. Primary nurse aware. CVC or Picc recommended for further venous acces.

## 2024-03-27 NOTE — Progress Notes (Signed)
 Pharmacy Antibiotic Note  Denise Savage is a 63 y.o. female admitted on 03/27/2024 with sepsis.  Pharmacy has been consulted for vancomycin  dosing.  Plan: Vancomycin  1750mg  IV x 1 then 1250mg  q24h (AUC 527.9, Scr used 0.8) Follow renal function, cultures and clinical course  Weight: 88 kg (194 lb 0.1 oz)  Temp (24hrs), Avg:100.6 F (38.1 C), Min:98.8 F (37.1 C), Max:102.3 F (39.1 C)  Recent Labs  Lab 03/27/24 2230 03/27/24 2246  WBC 8.6  --   CREATININE 0.49  --   LATICACIDVEN  --  0.9    Estimated Creatinine Clearance: 75.7 mL/min (by C-G formula based on SCr of 0.49 mg/dL).    Allergies  Allergen Reactions   Hydrocodone  Nausea Only    Antimicrobials this admission: 10/16 vanc >> 10/16 cefepime >> 10/16 flagyl x 1  Dose adjustments this admission:   Microbiology results: 10/15 BCx:   Thank you for allowing pharmacy to be a part of this patient's care.  Leeroy Mace RPh 03/27/2024, 11:19 PM

## 2024-03-27 NOTE — ED Provider Notes (Signed)
 [place] [facility] AT [facility] Provider Note   CSN: [id number] Arrival date & time: 03/27/24  2000     Patient presents with: gastro tube   Denise Savage is a 63 y.o. female.  {Add pertinent medical, surgical, social history, OB history to YEP:67052} HPI        Past Medical History:  Diagnosis Date  . Abdominal hernia   . Anxiety   . Depression   . Dysphagia   . G tube feedings (HCC)   . G tube feedings (HCC)   . Gastrostomy status (HCC)   . GERD (gastroesophageal reflux disease)   . Hemiplegia and hemiparesis following cerebral infarction affecting right dominant side (HCC)   . Non-verbal learning disorder   . Pulmonary embolus (HCC)   . Seizure (HCC)   . Seizures (HCC)   . Stroke (HCC)   . Wheelchair dependent      Prior to Admission medications   Medication Sig Start Date End Date Taking? Authorizing Provider  acetaminophen  (TYLENOL ) 325 MG tablet Place 650 mg into feeding tube in the morning, at noon, in the evening, and at bedtime. (0000, 0600, 1200, 1800)    [provider]  antiseptic oral rinse (CPC / CETYLPYRIDINIUM CHLORIDE 0.05%) 0.05 % LIQD solution 15 mLs by Mouth Rinse route in the morning, at noon, in the evening, and at bedtime. For mouth care use mouth swabs to clean out mouth    [provider]  cholecalciferol (VITAMIN D3) 25 MCG (1000 UNIT) tablet Place 1,000 Units into feeding tube daily.    [provider]  Dextromethorphan -quiNIDine (NUEDEXTA ) 20-10 MG capsule 1 capsule every 12 (twelve) hours. Via G-tube    [provider]  famotidine  (PEPCID ) 20 MG tablet Place 20 mg into feeding tube 2 (two) times daily.    [provider]  fiber supplement, BANATROL TF, liquid Place 60 mLs into feeding tube 3 (three) times daily. 02/03/23   Sherrill Cable Latif, DO  glycopyrrolate  (ROBINUL ) 1 MG tablet Take 1 mg by mouth every 8 (eight) hours as needed (increased secretions).    [provider]  lacosamide  (VIMPAT ) 10 MG/ML oral solution 10 mLs 2 (two) times daily. Per feeding tube 03/13/23   [provider]  LACTOBACILLUS PO Place 1 capsule into feeding tube in the morning, at noon, and at bedtime.    [provider]  medroxyPROGESTERone  Acetate 150 MG/ML SUSY Inject 150 mg into the muscle every 3 (three) months. 12/05/23   [provider]  metoprolol  tartrate (LOPRESSOR ) 25 MG tablet Take 1 tablet (25 mg total) by mouth 2 (two) times daily. Patient taking differently: Place 25 mg into feeding tube 2 (two) times daily. 08/29/23   Ricky Fines, MD  Multiple Vitamin (MULTIVITAMIN WITH MINERALS) TABS tablet Take 1 tablet by mouth daily.    [provider]  Nutritional Supplements (ISOSOURCE 1.5 CAL) LIQD Place 237 mLs into feeding tube 5 (five) times daily. (0600, 1000, 1400, 1800 & 2200)    [provider]  oxyCODONE  (OXY IR/ROXICODONE ) 5 MG immediate release tablet Place 5 mg into feeding tube in the morning and at bedtime. 02/23/24   [provider]  tiZANidine  (ZANAFLEX ) 2 MG tablet Place 2 mg into feeding tube 3 (three) times daily. 02/24/24   [provider]  traZODone  (DESYREL ) 50 MG tablet Take 25 mg by mouth at bedtime. (2200) 02/21/21   [provider]  valproic  acid (DEPAKENE ) 250 MG/5ML solution Place 15 mLs (750  mg total) into feeding tube 3 (three) times daily. 02/03/23   Sherrill Cable Latif, DO  Water  For Irrigation, Sterile (FREE WATER ) SOLN Place 30-240 mLs into feeding tube See admin instructions. Flush feeding tube with 240 mls of water  twice daily between feedings, flush with 60 ml of water  before and after initiating feedings &  Flush peg tube with 30 cc of water  before and after medication administration    [provider]    Allergies: Hydrocodone     Review of Systems  Updated Vital Signs BP (!) 158/98   Pulse 86   Temp 98.8 F (37.1 C) (Oral)   Resp 16   SpO2 99%    Physical Exam  (all labs ordered are listed, but only abnormal results are displayed) Labs Reviewed  CULTURE, BLOOD (ROUTINE X 2)  CULTURE, BLOOD (ROUTINE X 2)  CBC WITH DIFFERENTIAL/PLATELET  COMPREHENSIVE METABOLIC PANEL WITH GFR  LIPASE, BLOOD  I-STAT CG4 LACTIC ACID, ED    EKG: None  Radiology: CT ABDOMEN PELVIS WO CONTRAST Result Date: 03/27/2024 EXAM: CT ABDOMEN AND PELVIS WITHOUT CONTRAST 03/27/2024 05:07:49 PM TECHNIQUE: CT of the abdomen and pelvis was performed without the administration of intravenous contrast. Multiplanar reformatted images are provided for review. Automated exposure control, iterative reconstruction, and/or weight-based adjustment of the mA/kV was utilized to reduce the radiation dose to as low as reasonably achievable. COMPARISON: CT abdomen and pelvis 11/29/2023. CLINICAL HISTORY: Emesis, persistent. Patient had midline that appeared red and felt warm to the touch, and patient reacted in pain when we tried to flush saline. Arm around midline seemed firm. IV team came to try for a new line and was unable to start one because patient's limbs were contracted. FINDINGS: LOWER CHEST: Trace bilateral pleural effusions. LIVER: The liver is unremarkable. GALLBLADDER AND BILE DUCTS: Gallbladder is unremarkable. No biliary ductal dilatation. SPLEEN: No acute abnormality. PANCREAS: No acute abnormality. ADRENAL GLANDS: No acute abnormality. KIDNEYS, URETERS AND BLADDER: There are punctate bilateral renal calculi measuring up to 6 mm. There is a left renal pelvis calculus measuring 6 mm. There is a cyst in the inferior pole of the left kidney measuring 3.1 cm. Per consensus, no follow-up is needed for simple Bosniak type 1 and 2 renal cysts, unless the patient has a malignancy history or risk factors. No hydronephrosis. No perinephric or periureteral stranding. There is a 3 mm calculus in the posterior right bladder. The bladder is otherwise within normal limits. GI AND  BOWEL: Percutaneous gastrostomy tube balloon is now located within the second portion of the duodenum. There is distention and hyperdense material surrounding the catheter just proximal to the balloon which appears slightly mass like. There is a small air-fluid level in the stomach. There is inflammatory stranding surrounding the duodenum with small lymph nodes. Cannot exclude small amount of microperforation of the duodenum on image 2/42. The appendix appears normal. There is no bowel obstruction. PERITONEUM AND RETROPERITONEUM: No ascites. No free air. VASCULATURE: Aorta is normal in caliber. There are atherosclerotic calcifications of the aorta. LYMPH NODES: Small lymph nodes are noted in the inflammatory stranding surrounding the duodenum. REPRODUCTIVE ORGANS: Uterus is enlarged, unchanged. BONES AND SOFT TISSUES: Patient is status post anterior abdominal wall hernia repair with mesh. Tiny umbilical hernia with fat persists. No acute osseous abnormality. No focal soft tissue abnormality. IMPRESSION: 1. Percutaneous gastrostomy tube balloon malpositioned within the second portion of the duodenum with surrounding inflammatory stranding and small lymph nodes; questionable microperforation. 2. Punctate bilateral renal calculi measuring up  to 6 mm, including a 6 mm left renal pelvis calculus 3. 3 mm calculus in the posterior right bladder Electronically signed by: Greig Pique MD 03/27/2024 05:36 PM EDT RP Workstation: [id number]    {Document cardiac monitor, telemetry assessment procedure when appropriate:32947} Procedures   Medications Ordered in the ED  acetaminophen  (TYLENOL ) suppository 650 mg (has no administration in time range)  piperacillin -tazobactam (ZOSYN ) IVPB 3.375 g (has no administration in time range)      {Click here for ABCD2, HEART and other calculators REFRESH Note before signing:1}                              Medical Decision Making Amount and/or Complexity of Data  Reviewed Labs: ordered.  Risk OTC drugs. Prescription drug management.   ***  {Document critical care time when appropriate  Document review of labs and clinical decision tools ie CHADS2VASC2, etc  Document your independent review of radiology images and any outside records  Document your discussion with family members, caretakers and with consultants  Document social determinants of health affecting pt's care  Document your decision making why or why not admission, treatments were needed:32947:::1}   Final diagnoses:  None    ED Discharge Orders     None

## 2024-03-27 NOTE — ED Triage Notes (Signed)
 G tube blockage. Patient was here and released about 45 mins ago and CT came back showing blockage per medics. Dr. Virgel Turbett sent patient back to ER.

## 2024-03-27 NOTE — Progress Notes (Signed)
 Dear Doctor:  This patient has been identified as a candidate for Picc or CVC for the following reason (s): poor veins/poor circulatory system (CHF, COPD, emphysema, diabetes, steroid use, IV drug abuse, etc.) and restarts due to phlebitis and infiltration. If you agree, please write an order for the indicated device. For any questions contact the Vascular Access Team at [phone] if no answer, please leave a message.  Thank you for supporting the early vascular access assessment program

## 2024-03-28 ENCOUNTER — Emergency Department (HOSPITAL_COMMUNITY)

## 2024-03-28 DIAGNOSIS — D696 Thrombocytopenia, unspecified: Secondary | ICD-10-CM | POA: Diagnosis present

## 2024-03-28 DIAGNOSIS — Z86711 Personal history of pulmonary embolism: Secondary | ICD-10-CM | POA: Diagnosis not present

## 2024-03-28 DIAGNOSIS — I69351 Hemiplegia and hemiparesis following cerebral infarction affecting right dominant side: Secondary | ICD-10-CM | POA: Diagnosis not present

## 2024-03-28 DIAGNOSIS — L03113 Cellulitis of right upper limb: Secondary | ICD-10-CM | POA: Diagnosis present

## 2024-03-28 DIAGNOSIS — L89896 Pressure-induced deep tissue damage of other site: Secondary | ICD-10-CM | POA: Diagnosis present

## 2024-03-28 DIAGNOSIS — Y848 Other medical procedures as the cause of abnormal reaction of the patient, or of later complication, without mention of misadventure at the time of the procedure: Secondary | ICD-10-CM | POA: Diagnosis present

## 2024-03-28 DIAGNOSIS — R509 Fever, unspecified: Secondary | ICD-10-CM

## 2024-03-28 DIAGNOSIS — I69391 Dysphagia following cerebral infarction: Secondary | ICD-10-CM | POA: Diagnosis not present

## 2024-03-28 DIAGNOSIS — D638 Anemia in other chronic diseases classified elsewhere: Secondary | ICD-10-CM | POA: Diagnosis present

## 2024-03-28 DIAGNOSIS — Z6830 Body mass index (BMI) 30.0-30.9, adult: Secondary | ICD-10-CM | POA: Diagnosis not present

## 2024-03-28 DIAGNOSIS — L8990 Pressure ulcer of unspecified site, unspecified stage: Secondary | ICD-10-CM | POA: Diagnosis present

## 2024-03-28 DIAGNOSIS — I69320 Aphasia following cerebral infarction: Secondary | ICD-10-CM | POA: Diagnosis not present

## 2024-03-28 DIAGNOSIS — K9423 Gastrostomy malfunction: Secondary | ICD-10-CM | POA: Diagnosis present

## 2024-03-28 DIAGNOSIS — E43 Unspecified severe protein-calorie malnutrition: Secondary | ICD-10-CM | POA: Diagnosis present

## 2024-03-28 DIAGNOSIS — A419 Sepsis, unspecified organism: Secondary | ICD-10-CM | POA: Diagnosis present

## 2024-03-28 DIAGNOSIS — D649 Anemia, unspecified: Secondary | ICD-10-CM | POA: Diagnosis present

## 2024-03-28 DIAGNOSIS — Z79899 Other long term (current) drug therapy: Secondary | ICD-10-CM | POA: Diagnosis not present

## 2024-03-28 DIAGNOSIS — Z993 Dependence on wheelchair: Secondary | ICD-10-CM | POA: Diagnosis not present

## 2024-03-28 DIAGNOSIS — G40909 Epilepsy, unspecified, not intractable, without status epilepticus: Secondary | ICD-10-CM | POA: Diagnosis present

## 2024-03-28 DIAGNOSIS — E876 Hypokalemia: Secondary | ICD-10-CM | POA: Diagnosis present

## 2024-03-28 DIAGNOSIS — I69251 Hemiplegia and hemiparesis following other nontraumatic intracranial hemorrhage affecting right dominant side: Secondary | ICD-10-CM | POA: Diagnosis not present

## 2024-03-28 DIAGNOSIS — Z1152 Encounter for screening for COVID-19: Secondary | ICD-10-CM | POA: Diagnosis not present

## 2024-03-28 DIAGNOSIS — T85528A Displacement of other gastrointestinal prosthetic devices, implants and grafts, initial encounter: Secondary | ICD-10-CM | POA: Diagnosis present

## 2024-03-28 DIAGNOSIS — Z66 Do not resuscitate: Secondary | ICD-10-CM | POA: Diagnosis present

## 2024-03-28 DIAGNOSIS — L89152 Pressure ulcer of sacral region, stage 2: Secondary | ICD-10-CM | POA: Diagnosis present

## 2024-03-28 DIAGNOSIS — Z7401 Bed confinement status: Secondary | ICD-10-CM | POA: Diagnosis not present

## 2024-03-28 LAB — URINALYSIS, W/ REFLEX TO CULTURE (INFECTION SUSPECTED)
Bilirubin Urine: NEGATIVE
Glucose, UA: NEGATIVE mg/dL
Ketones, ur: NEGATIVE mg/dL
Nitrite: NEGATIVE
Protein, ur: NEGATIVE mg/dL
RBC / HPF: >50 RBC/hpf (ref 0–5)
Specific Gravity, Urine: 1.042 — ABNORMAL HIGH (ref 1.005–1.030)
WBC, UA: >50 WBC/hpf (ref 0–5)
pH: 6 (ref 5.0–8.0)

## 2024-03-28 LAB — COMPREHENSIVE METABOLIC PANEL WITH GFR
ALT: <5 U/L (ref 0–44)
AST: 14 U/L — ABNORMAL LOW (ref 15–41)
Albumin: 2.4 g/dL — ABNORMAL LOW (ref 3.5–5.0)
Alkaline Phosphatase: 55 U/L (ref 38–126)
Anion gap: 8 (ref 5–15)
BUN: 9 mg/dL (ref 8–23)
CO2: 21 mmol/L — ABNORMAL LOW (ref 22–32)
Calcium: 9 mg/dL (ref 8.9–10.3)
Chloride: 108 mmol/L (ref 98–111)
Creatinine, Ser: 0.41 mg/dL — ABNORMAL LOW (ref 0.44–1.00)
GFR, Estimated: >60 mL/min (ref >60)
Glucose, Bld: 74 mg/dL (ref 70–99)
Potassium: 3.2 mmol/L — ABNORMAL LOW (ref 3.5–5.1)
Sodium: 138 mmol/L (ref 135–145)
Total Bilirubin: 0.4 mg/dL (ref 0.0–1.2)
Total Protein: 5.2 g/dL — ABNORMAL LOW (ref 6.5–8.1)

## 2024-03-28 LAB — CBC WITH DIFFERENTIAL/PLATELET
Abs Immature Granulocytes: 0.01 K/uL (ref 0.00–0.07)
Basophils Absolute: 0 K/uL (ref 0.0–0.1)
Basophils Relative: 0 %
Eosinophils Absolute: 0 K/uL (ref 0.0–0.5)
Eosinophils Relative: 1 %
HCT: 39.4 % (ref 36.0–46.0)
Hemoglobin: 12.4 g/dL (ref 12.0–15.0)
Immature Granulocytes: 0 %
Lymphocytes Relative: 22 %
Lymphs Abs: 1.3 K/uL (ref 0.7–4.0)
MCH: 29.4 pg (ref 26.0–34.0)
MCHC: 31.5 g/dL (ref 30.0–36.0)
MCV: 93.4 fL (ref 80.0–100.0)
Monocytes Absolute: 0.5 K/uL (ref 0.1–1.0)
Monocytes Relative: 9 %
Neutro Abs: 4 K/uL (ref 1.7–7.7)
Neutrophils Relative %: 68 %
Platelets: 127 K/uL — ABNORMAL LOW (ref 150–400)
RBC: 4.22 MIL/uL (ref 3.87–5.11)
RDW: 15.3 % (ref 11.5–15.5)
WBC: 5.8 K/uL (ref 4.0–10.5)
nRBC: 0 % (ref 0.0–0.2)

## 2024-03-28 LAB — VALPROIC ACID LEVEL: Valproic Acid Lvl: 27 ug/mL — ABNORMAL LOW (ref 50–100)

## 2024-03-28 LAB — RESP PANEL BY RT-PCR (RSV, FLU A&B, COVID)  RVPGX2
Influenza A by PCR: NEGATIVE
Influenza B by PCR: NEGATIVE
Resp Syncytial Virus by PCR: NEGATIVE
SARS Coronavirus 2 by RT PCR: NEGATIVE

## 2024-03-28 LAB — PHOSPHORUS: Phosphorus: 2.2 mg/dL — ABNORMAL LOW (ref 2.5–4.6)

## 2024-03-28 LAB — MAGNESIUM: Magnesium: 1.5 mg/dL — ABNORMAL LOW (ref 1.7–2.4)

## 2024-03-28 MED ORDER — METRONIDAZOLE 500 MG/100ML IV SOLN
500.0000 mg | Freq: Two times a day (BID) | INTRAVENOUS | Status: DC
  Administered 2024-03-28 – 2024-03-29 (×2): 500 mg via INTRAVENOUS
  Filled 2024-03-28 (×2): qty 100

## 2024-03-28 MED ORDER — FREE WATER
200.0000 mL | Freq: Four times a day (QID) | Status: DC
  Administered 2024-03-28 – 2024-03-29 (×4): 200 mL

## 2024-03-28 MED ORDER — TRAZODONE HCL 50 MG PO TABS
25.0000 mg | ORAL_TABLET | Freq: Every day | ORAL | Status: DC
  Administered 2024-03-28 – 2024-03-29 (×2): 25 mg via ORAL
  Filled 2024-03-28 (×2): qty 1

## 2024-03-28 MED ORDER — METOPROLOL TARTRATE 25 MG PO TABS
25.0000 mg | ORAL_TABLET | Freq: Two times a day (BID) | ORAL | Status: DC
  Administered 2024-03-28 – 2024-03-30 (×3): 25 mg
  Filled 2024-03-28 (×4): qty 1

## 2024-03-28 MED ORDER — FAMOTIDINE 20 MG PO TABS
20.0000 mg | ORAL_TABLET | Freq: Two times a day (BID) | ORAL | Status: DC
  Administered 2024-03-28 – 2024-03-30 (×4): 20 mg
  Filled 2024-03-28 (×4): qty 1

## 2024-03-28 MED ORDER — VANCOMYCIN HCL 1250 MG/250ML IV SOLN
1250.0000 mg | INTRAVENOUS | Status: DC
  Administered 2024-03-29: 1250 mg via INTRAVENOUS
  Filled 2024-03-28: qty 250

## 2024-03-28 MED ORDER — GLYCOPYRROLATE 1 MG PO TABS: 1.0000 mg | ORAL_TABLET | Freq: Three times a day (TID) | ORAL | Status: DC | PRN

## 2024-03-28 MED ORDER — KCL IN DEXTROSE-NACL 20-5-0.9 MEQ/L-%-% IV SOLN
INTRAVENOUS | Status: AC
  Filled 2024-03-28 (×2): qty 1000

## 2024-03-28 MED ORDER — POTASSIUM CHLORIDE 10 MEQ/100ML IV SOLN
10.0000 meq | INTRAVENOUS | Status: AC
  Administered 2024-03-28 (×4): 10 meq via INTRAVENOUS
  Filled 2024-03-28 (×4): qty 100

## 2024-03-28 MED ORDER — TIZANIDINE HCL 4 MG PO TABS
2.0000 mg | ORAL_TABLET | Freq: Three times a day (TID) | ORAL | Status: DC
  Administered 2024-03-28 – 2024-03-30 (×5): 2 mg
  Filled 2024-03-28 (×5): qty 1

## 2024-03-28 MED ORDER — ACETAMINOPHEN 650 MG RE SUPP: 650.0000 mg | Freq: Four times a day (QID) | RECTAL | Status: DC | PRN

## 2024-03-28 MED ORDER — OXYCODONE HCL 5 MG PO TABS
5.0000 mg | ORAL_TABLET | Freq: Four times a day (QID) | ORAL | Status: AC | PRN
Start: 2024-03-28 — End: ?

## 2024-03-28 MED ORDER — ONDANSETRON HCL 4 MG/2ML IJ SOLN: 4.0000 mg | Freq: Four times a day (QID) | INTRAMUSCULAR | Status: DC | PRN

## 2024-03-28 MED ORDER — BANATROL TF EN LIQD
60.0000 mL | Freq: Three times a day (TID) | ENTERAL | Status: DC
  Administered 2024-03-28 – 2024-03-29 (×2): 60 mL
  Filled 2024-03-28 (×3): qty 60

## 2024-03-28 MED ORDER — VITAMIN D 25 MCG (1000 UNIT) PO TABS: 1000.0000 [IU] | ORAL_TABLET | Freq: Every day | ORAL | Status: DC

## 2024-03-28 MED ORDER — JEVITY 1.5 CAL/FIBER PO LIQD
237.0000 mL | Freq: Every day | ORAL | Status: DC
  Administered 2024-03-28 – 2024-03-30 (×9): 237 mL
  Filled 2024-03-28 (×11): qty 237

## 2024-03-28 MED ORDER — VALPROIC ACID 250 MG/5ML PO SOLN
750.0000 mg | Freq: Three times a day (TID) | ORAL | Status: DC
  Administered 2024-03-28 – 2024-03-30 (×5): 750 mg
  Filled 2024-03-28 (×7): qty 15

## 2024-03-28 MED ORDER — ACETAMINOPHEN 325 MG PO TABS: 650.0000 mg | ORAL_TABLET | Freq: Four times a day (QID) | ORAL | Status: DC | PRN

## 2024-03-28 MED ORDER — MAGNESIUM SULFATE 2 GM/50ML IV SOLN
2.0000 g | Freq: Once | INTRAVENOUS | Status: AC
  Administered 2024-03-28: 2 g via INTRAVENOUS
  Filled 2024-03-28: qty 50

## 2024-03-28 MED ORDER — LACOSAMIDE 10 MG/ML PO SOLN
100.0000 mg | Freq: Two times a day (BID) | ORAL | Status: DC
  Administered 2024-03-28 – 2024-03-30 (×4): 100 mg
  Filled 2024-03-28 (×4): qty 10

## 2024-03-28 MED ORDER — ISOSOURCE 1.5 CAL PO LIQD: 237.0000 mL | Freq: Every day | ORAL | Status: DC

## 2024-03-28 MED ORDER — IOHEXOL 300 MG/ML  SOLN
30.0000 mL | Freq: Once | INTRAMUSCULAR | Status: AC | PRN
  Administered 2024-03-28: 30 mL

## 2024-03-28 MED ORDER — LACTATED RINGERS IV SOLN: INTRAVENOUS | Status: AC

## 2024-03-28 MED ORDER — DEXTROMETHORPHAN-QUINIDINE 20-10 MG PO CAPS: 1.0000 | ORAL_CAPSULE | Freq: Two times a day (BID) | ORAL | Status: DC

## 2024-03-28 MED ORDER — IPRATROPIUM-ALBUTEROL 0.5-2.5 (3) MG/3ML IN SOLN
3.0000 mL | Freq: Four times a day (QID) | RESPIRATORY_TRACT | Status: AC | PRN
Start: 2024-03-28 — End: ?

## 2024-03-28 MED ORDER — CETYLPYRIDINIUM CHLORIDE 0.05 % MT LIQD
15.0000 mL | Freq: Four times a day (QID) | OROMUCOSAL | Status: DC
  Administered 2024-03-29 – 2024-03-30 (×5): 15 mL via OROMUCOSAL

## 2024-03-28 MED ORDER — IOHEXOL 300 MG/ML  SOLN
100.0000 mL | Freq: Once | INTRAMUSCULAR | Status: AC | PRN
  Administered 2024-03-28: 100 mL via INTRAVENOUS

## 2024-03-28 NOTE — ED Notes (Signed)
 Purewick in place on pt

## 2024-03-28 NOTE — Progress Notes (Addendum)
 Patient admitted to floor at this time. Patient with pink/diaphoretic face. Closed wound to sacrum/coccyx and right foot area. Foam dressings applied. Patient has a form access to right arm that is swollen, red/pink. Scant amount of blood from vaginal area. Purewick in place. Vital signs within normal limits at this time

## 2024-03-28 NOTE — Progress Notes (Signed)
 Plan of Care Note (Preliminary telephone/chart evaluation)      Denise Savage  10/03/1960 [id number]  CARE TEAM:  PCP: Denise Savage, No Pcp Per  Outpatient Care Team: Denise Savage Care Team: Denise Savage, No Pcp Per as PCP - General (General Practice) Denise Darice HERO, MD as Consulting Physician (Neurology) Denise Anes, MD as Consulting Physician (General Surgery) Denise Savage, Toribio, MD as Consulting Physician (Gastroenterology)  Inpatient Treatment Team: Treatment Team:  Bari Denise FALCON, MD Con Denise Savage, NT Denise Savage, Denise RAMAN, RN [facility], Md, MD    Surgical advice by telephone at the request of Dr Dreama.   Chief complaint / Reason for evaluation: Possible G-tube dislodgment  Called by ER doctor.  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  Gets tube feeds and medications.  Brought in from facility for concern G-tube not working correctly.  CAT scan done which raises concern of balloon in duodenal bulb.   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  There is no hard evidence of any perforation and certainly no massive free air.  There is no shock.  Denise Savage has a history of recurrent UTIs which is most likely cause of her fever.  Defer fever workup to emergency department/internal medicine.  Apparently IV access is a challenge right now to get blood work  and further studies.  I recommended gastrostomy tube balloon partial deflation to 3-39mL in balloon, retraction of G-tube back into the stomach with repositioning and reinfiltration of the balloon.  Bring the flange down for good apposition.  CT abd w G-tube contrast to study to confirm good placement and no leak or extravasation.  Should there be resistance or concerns, would recommend this be done in interventional radiology with most likely exchange of a new Mickey G-tube to avoid recurrent episodes  Should there be evidence of leak or extravasation on G-tube study, please reconsult surgery for further evaluation.     Denise KYM Schultze, MD, FACS, MASCRS Esophageal, Gastrointestinal & Colorectal Surgery Robotic and Minimally Invasive Surgery  [facility] [address] [place], [place] [address] [phone] Fax [phone] Main  CONTACT INFORMATION: Weekday (9AM-5PM): Call [facility] main office at [phone] Weeknight (5PM-9AM) or Weekend/Holiday: Check EPIC Web Links tab & use AMION (password  [email or web address])  for General Surgery [facility] coverage  Please, DO NOT use SecureChat  (it is not reliable communication to reach operating surgeons & will lead to a delay in care).   Epic staff messaging available for outpatient concerns needing 1-2 business day response.      03/28/2024     ########################################################################################################    Past Medical History:  Diagnosis Date   Abdominal hernia    Anxiety    Depression    Dysphagia    G tube feedings (HCC)    G tube feedings (HCC)    Gastrostomy status (HCC)  GERD (gastroesophageal reflux disease)    Hemiplegia and hemiparesis following cerebral infarction affecting right dominant side (HCC)    Non-verbal learning disorder    Pulmonary embolus (HCC)    Seizure (HCC)    Seizures (HCC)    Stroke [facility])     Wheelchair dependent     Past Surgical History:  Procedure Laterality Date   CRANIOPLASTY Left 03/23/2021   Procedure: CRANIOPLASTY, HARVEST ABDOMINAL BONE FLAP;  Surgeon: Lanis Pupa, MD;  Location: [facility] OR;  Service: Neurosurgery;  Laterality: Left;   CRANIOTOMY Left 06/19/2020   Procedure: CRANIOTOMY HEMATOMA EVACUATION SUBDURAL;  Surgeon: Cheryle Debby LABOR, MD;  Location: [facility] OR;  Service: Neurosurgery;  Laterality: Left;   ESOPHAGOGASTRODUODENOSCOPY N/A 07/01/2020   Procedure: ESOPHAGOGASTRODUODENOSCOPY (EGD);  Surgeon: Paola Dreama SAILOR, MD;  Location: [facility] ENDOSCOPY;  Service: General;  Laterality: N/A;   GASTROSTOMY  01/27/2022   IR ANGIO INTRA EXTRACRAN SEL INTERNAL CAROTID BILAT MOD SED  06/16/2020   IR ANGIO VERTEBRAL SEL VERTEBRAL UNI R MOD SED  06/16/2020   IR ANGIOGRAM FOLLOW UP STUDY  06/16/2020   IR ANGIOGRAM FOLLOW UP STUDY  06/16/2020   IR ANGIOGRAM FOLLOW UP STUDY  06/16/2020   IR CT HEAD LTD  06/16/2020   IR NEURO EACH ADD'L AFTER BASIC UNI RIGHT (MS)  06/16/2020   IR Denise Savage EVAL TECH 0-60 MINS  01/24/2022   IR RADIOLOGIST EVAL & MGMT  01/24/2022   IR REPLC GASTRO/COLONIC TUBE PERCUT W/FLUORO  10/03/2022   IR TRANSCATH/EMBOLIZ  06/16/2020   IR US  GUIDE VASC ACCESS RIGHT  06/16/2020   PEG PLACEMENT N/A 07/01/2020   Procedure: PERCUTANEOUS ENDOSCOPIC GASTROSTOMY (PEG) PLACEMENT;  Surgeon: Paola Dreama SAILOR, MD;  Location: [facility] ENDOSCOPY;  Service: General;  Laterality: N/A;   RADIOLOGY WITH ANESTHESIA N/A 06/16/2020   Procedure: IR WITH ANESTHESIA;  Surgeon: Lanis Pupa, MD;  [facility] OR;  Service: Radiology;  Laterality: N/A;   TUBAL LIGATION     VENTRAL HERNIA REPAIR N/A 12/29/2014   Procedure: LAPAROSCOPIC VENTRAL HERNIA WITH MESH;  Surgeon: Oneil Budge, MD;  Location: [facility] ORS;  Service: General;  Laterality: N/A;    Social History   Socioeconomic History   Marital status: Single    Spouse name: Not on file   Number of children: Not on file   Years of education: Not on file    Highest education level: Not on file  Occupational History   Not on file  Tobacco Use   Smoking status: Never   Smokeless tobacco: Never  Vaping Use   Vaping status: Never Used  Substance and Sexual Activity   Alcohol use: No   Drug use: No   Sexual activity: Not Currently    Comment: tubal ligation  Other Topics Concern   Not on file  Social History Narrative   [employer]    Social Drivers of Health   Financial Resource Strain: Not on file  Food Insecurity: Denise Savage Unable To Answer (08/24/2023)   Hunger Vital Sign    Worried About Running Out of Food in the Last Year: Denise Savage unable to answer    Ran Out of Food in the Last Year: Denise Savage unable to answer  Transportation Needs: Denise Savage Unable To Answer (08/24/2023)   PRAPARE - Transportation    Lack of Transportation (Medical): Denise Savage unable to answer    Lack of Transportation (Non-Medical): Denise Savage unable to answer  Physical Activity: Not on file  Stress: Not on file  Social Connections: Denise Savage Unable To Answer (08/24/2023)  Social Connection and Isolation Panel    Frequency of Communication with Friends and Family: Denise Savage unable to answer    Frequency of Social Gatherings with Friends and Family: Denise Savage unable to answer    Attends Religious Services: Denise Savage unable to answer    Active Member of Clubs or Organizations: Denise Savage unable to answer    Attends Banker Meetings: Denise Savage unable to answer    Marital Status: Denise Savage unable to answer  Intimate Partner Violence: Denise Savage Unable To Answer (08/24/2023)   Humiliation, Afraid, Rape, and Kick questionnaire    Fear of Current or Ex-Partner: Denise Savage unable to answer    Emotionally Abused: Denise Savage unable to answer    Physically Abused: Denise Savage unable to answer    Sexually Abused: Denise Savage unable to answer    Family History  Problem Relation Age of Onset   Pancreatitis Neg Hx     Current Facility-Administered Medications  Medication Dose  Route Frequency Provider Last Rate Last Admin   acetaminophen  (TYLENOL ) suppository 650 mg  650 mg Rectal Once Schlossman, Erin, MD       ceFEPIme (MAXIPIME) 2 g in sodium chloride  0.9 % 100 mL IVPB  2 g Intravenous Q8H Dreama Longs, MD 200 mL/hr at 03/27/24 2354 2 g at 03/27/24 2354   metroNIDAZOLE (FLAGYL) IVPB 500 mg  500 mg Intravenous Once Schlossman, Erin, MD 100 mL/hr at 03/27/24 2347 500 mg at 03/27/24 2347   vancomycin  (VANCOREADY) IVPB 1750 mg/350 mL  1,750 mg Intravenous Once Schlossman, Erin, MD       Current Outpatient Medications  Medication Sig Dispense Refill   acetaminophen  (TYLENOL ) 325 MG tablet Place 650 mg into feeding tube in the morning, at noon, in the evening, and at bedtime. (0000, 0600, 1200, 1800)     antiseptic oral rinse (CPC / CETYLPYRIDINIUM CHLORIDE 0.05%) 0.05 % LIQD solution 15 mLs by Mouth Rinse route in the morning, at noon, in the evening, and at bedtime. For mouth care use mouth swabs to clean out mouth     cholecalciferol (VITAMIN D3) 25 MCG (1000 UNIT) tablet Place 1,000 Units into feeding tube daily.     Dextromethorphan -quiNIDine (NUEDEXTA ) 20-10 MG capsule 1 capsule every 12 (twelve) hours. Via G-tube     famotidine  (PEPCID ) 20 MG tablet Place 20 mg into feeding tube 2 (two) times daily.     fiber supplement, BANATROL TF, liquid Place 60 mLs into feeding tube 3 (three) times daily.     glycopyrrolate  (ROBINUL ) 1 MG tablet Take 1 mg by mouth every 8 (eight) hours as needed (increased secretions).     lacosamide  (VIMPAT ) 10 MG/ML oral solution 10 mLs 2 (two) times daily. Per feeding tube     LACTOBACILLUS PO Place 1 capsule into feeding tube in the morning, at noon, and at bedtime.     medroxyPROGESTERone  Acetate 150 MG/ML SUSY Inject 150 mg into the muscle every 3 (three) months.     metoprolol  tartrate (LOPRESSOR ) 25 MG tablet Take 1 tablet (25 mg total) by mouth 2 (two) times daily. (Denise Savage taking differently: Place 25 mg into feeding tube 2 (two)  times daily.) 60 tablet 1   Multiple Vitamin (MULTIVITAMIN WITH MINERALS) TABS tablet Take 1 tablet by mouth daily.     Nutritional Supplements (ISOSOURCE 1.5 CAL) LIQD Place 237 mLs into feeding tube 5 (five) times daily. (0600, 1000, 1400, 1800 & 2200)     oxyCODONE  (OXY IR/ROXICODONE ) 5 MG immediate release tablet Place 5 mg into feeding tube in the morning  and at bedtime.     tiZANidine  (ZANAFLEX ) 2 MG tablet Place 2 mg into feeding tube 3 (three) times daily.     traZODone  (DESYREL ) 50 MG tablet Take 25 mg by mouth at bedtime. (2200)     valproic  acid (DEPAKENE ) 250 MG/5ML solution Place 15 mLs (750 mg total) into feeding tube 3 (three) times daily.     Water  For Irrigation, Sterile (FREE WATER ) SOLN Place 30-240 mLs into feeding tube See admin instructions. Flush feeding tube with 240 mls of water  twice daily between feedings, flush with 60 ml of water  before and after initiating feedings &  Flush peg tube with 30 cc of water  before and after medication administration       Allergies  Allergen Reactions   Hydrocodone  Nausea Only     BP 130/63   Pulse 85   Temp 99.6 F (37.6 C) (Axillary)   Resp 13   Wt 88 kg   SpO2 96%   BMI 34.37 kg/m     Results:   Labs: Results for orders placed or performed during the hospital encounter of 03/27/24 (from the past 48 hours)  CBC with Differential     Status: Abnormal   Collection Time: 03/27/24 10:30 PM  Result Value Ref Range   WBC 8.6 4.0 - 10.5 K/uL   RBC 3.75 (L) 3.87 - 5.11 MIL/uL   Hemoglobin 10.8 (L) 12.0 - 15.0 g/dL   HCT 65.5 (L) 63.9 - 53.9 %   MCV 91.7 80.0 - 100.0 fL   MCH 28.8 26.0 - 34.0 pg   MCHC 31.4 30.0 - 36.0 g/dL   RDW 84.8 88.4 - 84.4 %   Platelets 144 (L) 150 - 400 K/uL   nRBC 0.0 0.0 - 0.2 %   Neutrophils Relative % 73 %   Neutro Abs 6.2 1.7 - 7.7 K/uL   Lymphocytes Relative 15 %   Lymphs Abs 1.3 0.7 - 4.0 K/uL   Monocytes Relative 12 %   Monocytes Absolute 1.0 0.1 - 1.0 K/uL   Eosinophils Relative 0  %   Eosinophils Absolute 0.0 0.0 - 0.5 K/uL   Basophils Relative 0 %   Basophils Absolute 0.0 0.0 - 0.1 K/uL   Immature Granulocytes 0 %   Abs Immature Granulocytes 0.03 0.00 - 0.07 K/uL    Comment: Performed at [facility], [address], [place], [place] [address]  Comprehensive metabolic panel     Status: Abnormal   Collection Time: 03/27/24 10:30 PM  Result Value Ref Range   Sodium 136 135 - 145 mmol/L   Potassium 3.4 (L) 3.5 - 5.1 mmol/L   Chloride 101 98 - 111 mmol/L   CO2 25 22 - 32 mmol/L   Glucose, Bld 86 70 - 99 mg/dL    Comment: Glucose reference range applies only to samples taken after fasting for at least 8 hours.   BUN 9 8 - 23 mg/dL   Creatinine, Ser 9.50 0.44 - 1.00 mg/dL   Calcium 89.4 (H) 8.9 - 10.3 mg/dL   Total Protein 6.4 (L) 6.5 - 8.1 g/dL   Albumin  3.1 (L) 3.5 - 5.0 g/dL   AST 10 (L) 15 - 41 U/L   ALT <5 0 - 44 U/L   Alkaline Phosphatase 67 38 - 126 U/L   Total Bilirubin 0.5 0.0 - 1.2 mg/dL   GFR, Estimated >39 >39 mL/min    Comment: (NOTE) Calculated using the CKD-EPI Creatinine Equation (2021)    Anion gap 9  5 - 15    Comment: Performed at [facility], [address], [place], [place] [address]  Lipase, blood     Status: None   Collection Time: 03/27/24 10:30 PM  Result Value Ref Range   Lipase 15 11 - 51 U/L    Comment: Performed at [facility], [address] [place], [place] [address]  I-Stat CG4 Lactic Acid     Status: None   Collection Time: 03/27/24 10:46 PM  Result Value Ref Range   Lactic Acid, Venous 0.9 0.5 - 1.9 mmol/L    Imaging / Studies: DG Chest Portable 1 View Result Date: 03/27/2024 CLINICAL DATA:  Cough EXAM: PORTABLE CHEST 1 VIEW COMPARISON:  08/23/2023 FINDINGS: Heart and mediastinal contours are within normal limits. No focal opacities or effusions. No acute bony abnormality. IMPRESSION: No active disease. Electronically Signed   By: Franky Crease M.D.   On:  03/27/2024 22:59   CT ABDOMEN PELVIS WO CONTRAST Result Date: 03/27/2024 EXAM: CT ABDOMEN AND PELVIS WITHOUT CONTRAST 03/27/2024 05:07:49 PM TECHNIQUE: CT of the abdomen and pelvis was performed without the administration of intravenous contrast. Multiplanar reformatted images are provided for review. Automated exposure control, iterative reconstruction, and/or weight-based adjustment of the mA/kV was utilized to reduce the radiation dose to as low as reasonably achievable. COMPARISON: CT abdomen and pelvis 11/29/2023. CLINICAL HISTORY: Emesis, persistent. Denise Savage had midline that appeared red and felt warm to the touch, and Denise Savage reacted in pain when we tried to flush saline. Arm around midline seemed firm. IV team came to try for a new line and was unable to start one because Denise Savage's limbs were contracted. FINDINGS: LOWER CHEST: Trace bilateral pleural effusions. LIVER: The liver is unremarkable. GALLBLADDER AND BILE DUCTS: Gallbladder is unremarkable. No biliary ductal dilatation. SPLEEN: No acute abnormality. PANCREAS: No acute abnormality. ADRENAL GLANDS: No acute abnormality. KIDNEYS, URETERS AND BLADDER: There are punctate bilateral renal calculi measuring up to 6 mm. There is a left renal pelvis calculus measuring 6 mm. There is a cyst in the inferior pole of the left kidney measuring 3.1 cm. Per consensus, no follow-up is needed for simple Bosniak type 1 and 2 renal cysts, unless the Denise Savage has a malignancy history or risk factors. No hydronephrosis. No perinephric or periureteral stranding. There is a 3 mm calculus in the posterior right bladder. The bladder is otherwise within normal limits. GI AND BOWEL: Percutaneous gastrostomy tube balloon is now located within the second portion of the duodenum. There is distention and hyperdense material surrounding the catheter just proximal to the balloon which appears slightly mass like. There is a small air-fluid level in the stomach. There is  inflammatory stranding surrounding the duodenum with small lymph nodes. Cannot exclude small amount of microperforation of the duodenum on image 2/42. The appendix appears normal. There is no bowel obstruction. PERITONEUM AND RETROPERITONEUM: No ascites. No free air. VASCULATURE: Aorta is normal in caliber. There are atherosclerotic calcifications of the aorta. LYMPH NODES: Small lymph nodes are noted in the inflammatory stranding surrounding the duodenum. REPRODUCTIVE ORGANS: Uterus is enlarged, unchanged. BONES AND SOFT TISSUES: Denise Savage is status post anterior abdominal wall hernia repair with mesh. Tiny umbilical hernia with fat persists. No acute osseous abnormality. No focal soft tissue abnormality. IMPRESSION: 1. Percutaneous gastrostomy tube balloon malpositioned within the second portion of the duodenum with surrounding inflammatory stranding and small lymph nodes; questionable microperforation. 2. Punctate bilateral renal calculi measuring up to 6 mm, including a 6 mm left  renal pelvis calculus 3. 3 mm calculus in the posterior right bladder Electronically signed by: Greig Pique MD 03/27/2024 05:36 PM EDT RP Workstation: [id number]    Medications / Allergies: per chart  Antibiotics: Anti-infectives (From admission, onward)    Start     Dose/Rate Route Frequency Ordered Stop   03/27/24 2330  ceFEPIme (MAXIPIME) 2 g in sodium chloride  0.9 % 100 mL IVPB        2 g 200 mL/hr over 30 Minutes Intravenous Every 8 hours 03/27/24 2317     03/27/24 2315  metroNIDAZOLE (FLAGYL) IVPB 500 mg        500 mg 100 mL/hr over 60 Minutes Intravenous  Once 03/27/24 2306     03/27/24 2245  vancomycin  (VANCOREADY) IVPB 1750 mg/350 mL        1,750 mg 175 mL/hr over 120 Minutes Intravenous  Once 03/27/24 2244     03/27/24 2145  piperacillin -tazobactam (ZOSYN ) IVPB 3.375 g  Status:  Discontinued        3.375 g 100 mL/hr over 30 Minutes Intravenous  Once 03/27/24 2136 03/27/24 2306         Note: Portions  of this report may have been transcribed using voice recognition software. Every effort was made to ensure accuracy; however, inadvertent computerized transcription errors may be present.   Any transcriptional errors that result from this process are unintentional.      03/28/2024  12:12 AM

## 2024-03-28 NOTE — ED Notes (Signed)
 Patient has a midline in right arm and per IV team it is filtrated per IV team and patient will need a central line. Provider and charge aware.

## 2024-03-28 NOTE — ED Notes (Signed)
 Skin breakdown noted to the crease of patient left arm. Provider aware

## 2024-03-28 NOTE — ED Provider Notes (Signed)
 Patient signed out to CT and lab work.  CT scan shows gastrostomy tube in a satisfactory location.  No ongoing evidence of leak; however, could be related to tamponade.  Surgery had been consulted previously.  Will see patient in the morning.  She has been covered with antibiotics.  Will need short-term interval CT follow-up.  Requested nursing In-N-Out cath the patient for urine.  Will plan for admission.   Bari Charmaine FALCON, MD 03/28/24 [phone]

## 2024-03-28 NOTE — ED Notes (Signed)
 Patient self removed IV once it was replaced.

## 2024-03-28 NOTE — Progress Notes (Signed)
  Carryover admission to the Day Admitter.  I discussed this case with the EDP, Dr. Charmaine Bough.  Per these discussions:   This is a 63 year old female who is nonverbal at baseline, chronic G-tube, who is being admitted with concern regarding G-tube dislodgment will is fever after presenting with altered mental status, which family conveys is evidenced by the patient's diminished interaction over the last day.   Over the last day, the patient developed an objective fever, prompting outpatient CT abdomen/pelvis, which was reported to show evidence of dislodgment of the G-tube, as well as potential bowel perforation.  EDP d/w on-call general surgeon, Dr. Sheldon, who conveyed that general surgery will consult. Dr. Sheldon also recommended repeat CT abdomen/pelvis, stomach contrast, which was performed in was less concerning for bowel perforation.   Patient is a midline catheter, with some surrounding erythema concerning for associated cellulitis.  Tmax in the ED presents 102, which is decreased to 99.6.  Most recent systolic blood pressures in the 120s.  COVID, influenza, RSV PCR are all negative.  Blood cultures x 2 were collected prior to initiation of IV vancomycin , cefepime, and IV Flagyl.  Urinalysis is pending.  Acid is not elevated.  Chest x-ray is reported to show no evidence of acute cardiopulmonary process, including no evidence of infiltrate.  I have placed an order for  obs for further evaluation and management of the above.  I have placed some additional preliminary admit orders via the adult multi-morbid admission order set. I have also ordered lactated Ringer 's at 5 cc/h x 12 hours, and if continued the IV vancomycin , cefepime, and Flagyl that were initiated in the ED this evening.  Also ordered morning labs in the form of CMP, CBC, and magnesium  level.     Eva Pore, DO Hospitalist

## 2024-03-28 NOTE — H&P (Signed)
 History and Physical    Patient: Denise Savage FMW:[id number] DOB: [birth year] DOA: 03/27/2024 DOS: the patient was seen and examined on 03/28/2024 PCP: Patient, No Pcp Per  Patient coming from: SNF  Chief Complaint:  Chief Complaint  Patient presents with   gastro tube   HPI: Denise Savage is a 63 y.o. 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  She is nonverbal, but has been lethargic.  Her tube was placed on 02/04/2024 at [facility] by Dr. Mavis.  She was also seen again at [facility] on 03/03/2024 due to PEG dysfunction.  She is unable to provide further information at this time.  ED course: Initial vital signs were temperature 102.3 F, pulse 90, respirations 31, BP 178/126 mmHg and O2 sat 100% on room air.  The patient was started on IV fluids, vancomycin , metronidazole and cefepime.  I added magnesium  sulfate 2 g IVPB while in the emergency department.  Lab work: Urinalysis showed increased specific gravity at 1.042, moderate leukocyte esterase, large hemoglobin, greater than 50 RBC, greater than 50 WBC, positive WBC clumps.  CBC showed a white count of 8.6, hemoglobin 10.8 g/dL platelets 855.  Lactic acid and lipase were normal.  CMP showed potassium of 3.4 mmol/L and corrected calcium of 11.3 mg/dL.  Renal function and the rest of the electrolytes were normal.  AST was 10 units/L, total protein 6.4 and albumin  3.1 g/dL.  Unremarkable ALT, alk phos and total bilirubin.  Magnesium  levels 1.5 mg/dL.  Imaging: Portable 1 view chest radiograph showed no active disease.  CT  abdomen/pelvis without contrast showing a malpositioned PEG within the second portion of the duodenum with surrounding inflammatory stranding and small lymph nodes.  Questionable microperforation.  Punctate bilateral renal calculi measuring up to 6 mm, including a 6 mm left renal pelvis calculus.  3 mm calculus in the posterior right bladder.  CT abdomen/pelvis with contrast showed the PEG tube not retracted into satisfactory position.  No extraluminal contrast to configure gastric perforation/leak.  However, this is believed to be tamponaded by the gastric balloon, as direct transgression of the posterior gastric antrum is evident on the recent imaging.  If surgery or endoscopic evaluation not performed, short-term follow-up CT suggested.  General surgery review: Per general surgery, her G-tube has been replaced and now is in the correct position without concern for perforation or microperforation according to general surgery review.  They do not think that there is a need for general surgery at the moment.   Review of Systems: As mentioned in the history of present illness. All other systems reviewed and are negative. Past Medical History:  Diagnosis Date   Abdominal hernia    Anxiety    Depression    Dysphagia    G tube feedings (HCC)    G tube feedings (HCC)    Gastrostomy status (HCC)    GERD (gastroesophageal reflux disease)    Hemiplegia and hemiparesis following cerebral infarction affecting right dominant side (HCC)    Non-verbal learning disorder    Pulmonary embolus (HCC)    Seizure (HCC)    Seizures (HCC)    Stroke [facility])    Wheelchair dependent  Past Surgical History:  Procedure Laterality Date   CRANIOPLASTY Left 03/23/2021   Procedure: CRANIOPLASTY, HARVEST ABDOMINAL BONE FLAP;  Surgeon: Lanis Pupa, MD;  Location: [facility] OR;  Service: Neurosurgery;  Laterality: Left;   CRANIOTOMY Left 06/19/2020   Procedure: CRANIOTOMY HEMATOMA EVACUATION SUBDURAL;  Surgeon: Cheryle Debby LABOR, MD;  Location: [facility] OR;  Service: Neurosurgery;  Laterality: Left;   ESOPHAGOGASTRODUODENOSCOPY N/A 07/01/2020   Procedure: ESOPHAGOGASTRODUODENOSCOPY (EGD);  Surgeon: Paola Dreama SAILOR, MD;  Location: [facility] ENDOSCOPY;  Service: General;  Laterality: N/A;   GASTROSTOMY  01/27/2022   IR ANGIO INTRA EXTRACRAN SEL INTERNAL CAROTID BILAT MOD SED  06/16/2020   IR ANGIO VERTEBRAL SEL VERTEBRAL UNI R MOD SED  06/16/2020   IR ANGIOGRAM FOLLOW UP STUDY  06/16/2020   IR ANGIOGRAM FOLLOW UP STUDY  06/16/2020   IR ANGIOGRAM FOLLOW UP STUDY  06/16/2020   IR CT HEAD LTD  06/16/2020   IR NEURO EACH ADD'L AFTER BASIC UNI RIGHT (MS)  06/16/2020   IR PATIENT EVAL TECH 0-60 MINS  01/24/2022   IR RADIOLOGIST EVAL & MGMT  01/24/2022   IR REPLC GASTRO/COLONIC TUBE PERCUT W/FLUORO  10/03/2022   IR TRANSCATH/EMBOLIZ  06/16/2020   IR US  GUIDE VASC ACCESS RIGHT  06/16/2020   PEG PLACEMENT N/A 07/01/2020   Procedure: PERCUTANEOUS ENDOSCOPIC GASTROSTOMY (PEG) PLACEMENT;  Surgeon: Paola Dreama SAILOR, MD;  Location: [facility] ENDOSCOPY;  Service: General;  Laterality: N/A;   RADIOLOGY WITH ANESTHESIA N/A 06/16/2020   Procedure: IR WITH ANESTHESIA;  Surgeon: Lanis Pupa, MD;  Location: [facility] OR;  Service: Radiology;  Laterality: N/A;   TUBAL LIGATION     VENTRAL HERNIA REPAIR N/A 12/29/2014   Procedure: LAPAROSCOPIC VENTRAL HERNIA WITH MESH;  Surgeon: Oneil Budge, MD;  Location: [facility] ORS;  Service: General;  Laterality: N/A;   Social History:  reports that she has never smoked. She has never used smokeless tobacco. She reports that she does not drink alcohol and does not use drugs.  Allergies  Allergen Reactions   Hydrocodone  Nausea Only    Family History  Problem Relation Age of Onset   Pancreatitis Neg Hx     Prior to Admission medications   Medication Sig Start Date End Date Taking? Authorizing Provider  acetaminophen  (TYLENOL ) 325 MG tablet Place 650 mg into feeding tube in the morning, at noon, in the evening, and at bedtime. (0000,  0600, 1200, 1800)    [provider]  antiseptic oral rinse (CPC / CETYLPYRIDINIUM CHLORIDE 0.05%) 0.05 % LIQD solution 15 mLs by Mouth Rinse route in the morning, at noon, in the evening, and at bedtime. For mouth care use mouth swabs to clean out mouth    [provider]  cholecalciferol (VITAMIN D3) 25 MCG (1000 UNIT) tablet Place 1,000 Units into feeding tube daily.    [provider]  Dextromethorphan -quiNIDine (NUEDEXTA ) 20-10 MG capsule 1 capsule every 12 (twelve) hours. Via G-tube    [provider]  famotidine  (PEPCID ) 20 MG tablet Place 20 mg into feeding tube 2 (two) times daily.    [provider]  fiber supplement, BANATROL TF, liquid Place 60 mLs into feeding tube 3 (three) times daily. 02/03/23   Sherrill Cable Latif, DO  glycopyrrolate  (ROBINUL ) 1 MG tablet Take 1 mg by mouth every 8 (eight) hours as needed (increased secretions).    [provider]  lacosamide  (VIMPAT ) 10 MG/ML oral solution 10 mLs 2 (two) times daily. Per feeding tube 03/13/23   [provider]  LACTOBACILLUS  PO Place 1 capsule into feeding tube in the morning, at noon, and at bedtime.    [provider]  medroxyPROGESTERone  Acetate 150 MG/ML SUSY Inject 150 mg into the muscle every 3 (three) months. 12/05/23   [provider]  metoprolol  tartrate (LOPRESSOR ) 25 MG tablet Take 1 tablet (25 mg total) by mouth 2 (two) times daily. Patient taking differently: Place 25 mg into feeding tube 2 (two) times daily. 08/29/23   Ricky Fines, MD  Multiple Vitamin (MULTIVITAMIN WITH MINERALS) TABS tablet Take 1 tablet by mouth daily.    [provider]  Nutritional Supplements (ISOSOURCE 1.5 CAL) LIQD Place 237 mLs into feeding tube 5 (five) times daily. (0600, 1000, 1400, 1800 & 2200)    [provider]  oxyCODONE  (OXY IR/ROXICODONE ) 5 MG immediate release tablet Place 5 mg into feeding tube in the morning and at bedtime. 02/23/24    [provider]  tiZANidine  (ZANAFLEX ) 2 MG tablet Place 2 mg into feeding tube 3 (three) times daily. 02/24/24   [provider]  traZODone  (DESYREL ) 50 MG tablet Take 25 mg by mouth at bedtime. (2200) 02/21/21   [provider]  valproic  acid (DEPAKENE ) 250 MG/5ML solution Place 15 mLs (750 mg total) into feeding tube 3 (three) times daily. 02/03/23   Sherrill Cable Latif, DO  Water  For Irrigation, Sterile (FREE WATER ) SOLN Place 30-240 mLs into feeding tube See admin instructions. Flush feeding tube with 240 mls of water  twice daily between feedings, flush with 60 ml of water  before and after initiating feedings &  Flush peg tube with 30 cc of water  before and after medication administration    [provider]    Physical Exam: Vitals:   03/28/24 0400 03/28/24 0457 03/28/24 0500 03/28/24 0600  BP: 111/64  114/68 131/61  Pulse: 69  76 65  Resp: (!) 21  10 19   Temp:  99.6 F (37.6 C)    TempSrc:  Axillary    SpO2: 100%  100% 100%  Weight:       Physical Exam Vitals and nursing note reviewed.  Constitutional:      General: She is awake. She is not in acute distress.    Appearance: Normal appearance. She is obese. She is ill-appearing.  HENT:     Head: Normocephalic.     Nose: No rhinorrhea.     Mouth/Throat:     Mouth: Mucous membranes are moist.  Eyes:     General: No scleral icterus.    Pupils: Pupils are equal, round, and reactive to light.  Neck:     Vascular: No JVD.  Cardiovascular:     Rate and Rhythm: Normal rate and regular rhythm.     Heart sounds: S1 normal and S2 normal.  Pulmonary:     Effort: Pulmonary effort is normal.     Breath sounds: Normal breath sounds. No wheezing, rhonchi or rales.  Abdominal:     General: Bowel sounds are normal. There is no distension.     Palpations: Abdomen is soft.     Tenderness: There is no abdominal tenderness. There is no right CVA tenderness or left CVA tenderness.  Musculoskeletal:      Cervical back: Neck supple.     Right lower leg: No edema.     Left lower leg: No edema.     Comments: Extremities are contracted.  Skin:    General: Skin is warm and dry.  Neurological:     Mental Status: Mental status is  at baseline.     Data Reviewed:  Results are pending, will review when available. 06/22/2021 echocardiogram report. IMPRESSIONS:   1. Left ventricular ejection fraction, by estimation, is 60 to 65%. The  left ventricle has normal function. The left ventricle has no regional  wall motion abnormalities. Left ventricular diastolic parameters were  normal.   2. Right ventricular systolic function is normal. The right ventricular  size is normal. Tricuspid regurgitation signal is inadequate for assessing  PA pressure.   3. The mitral valve is normal in structure. No evidence of mitral valve  regurgitation. No evidence of mitral stenosis.   4. The aortic valve is normal in structure. Aortic valve regurgitation is  not visualized. No aortic stenosis is present.   5. The inferior vena cava is normal in size with greater than 50%  respiratory variability, suggesting right atrial pressure of 3 mmHg.   EKG: Vent. rate 80 BPM PR interval 114 ms QRS duration 84 ms QT/QTcB 356/411 ms P-R-T axes 75 -7 33 Sinus rhythm Borderline short PR interval Abnormal R-wave progression, early transition  Assessment and Plan: Principal Problem:   Fever, unspecified In the setting of:   Cellulitis of right upper extremity  Admit to PCU/inpatient. Continue IV fluids. Continue cefepime 2 g every 8 hours.   Continue metronidazole 500 mg IVPB q 12 hr. Continue vancomycin  per pharmacy. Follow-up blood culture and sensitivity Follow CBC and CMP in a.m. Will need removal of line. -Will consult IV team.  Active Problems:   Gastrojejunostomy tube dislodgemen   Gastrostomy tube dysfunction [facility]) Reviewed with general surgery. Tube is in position and no perforation  seen. General surgery consult has been deferred. We we will reconsult them if needed.    Hypokalemia Replacing. Might need Daily supplement Follow potassium level in AM.    Hypomagnesemia Replaced. May need regular supplementation.    Normocytic anemia Monitor hematocrit and hemoglobin.    Protein-calorie malnutrition, severe In the setting of anemia and chronic illness. May benefit from protein supplementation. Consider nutritional services evaluation. Follow-up albumin  level in the morning.    Hypercalcemia Hold vitamin D supplement. Follow-up calcium level.    Thrombocytopenia Monitor platelet count.    Pressure injury of skin Continue local care. Consult wound and ostomy care.    Seizures (HCC) Continue valproic  acid 750 mg p.o. 3 times daily. Continue lacosamide  100 mg p.o. twice daily.    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 Continue muscle relaxant.    Advance Care Planning:   Code Status: Limited: Do not attempt resuscitation (DNR) -DNR-LIMITED -Do Not Intubate/DNI .  After a conversation with her daughter Sophia, she has been made DNR at her facility several months ago.  Consults:   Family Communication:   Severity of Illness: The appropriate patient status for this patient is OBSERVATION. Observation status is judged to be reasonable and necessary in order to provide the required intensity of service to ensure the patient's safety. The patient's presenting symptoms, physical exam findings, and initial radiographic and laboratory data in the context of their medical condition is felt to place them at decreased risk for further clinical deterioration. Furthermore, it is anticipated that the patient will be medically stable for discharge from the hospital within 2 midnights of admission.   Author: Alm Dorn Castor, MD 03/28/2024 7:44 AM  For on call review [email or web address].   This document was prepared using  Dragon voice recognition software and may contain some unintended transcription errors.

## 2024-03-29 DIAGNOSIS — R509 Fever, unspecified: Secondary | ICD-10-CM | POA: Diagnosis not present

## 2024-03-29 LAB — COMPREHENSIVE METABOLIC PANEL WITH GFR
ALT: <5 U/L (ref 0–44)
ALT: <5 U/L (ref 0–44)
AST: 20 U/L (ref 15–41)
AST: 21 U/L (ref 15–41)
Albumin: 2.6 g/dL — ABNORMAL LOW (ref 3.5–5.0)
Albumin: 2.7 g/dL — ABNORMAL LOW (ref 3.5–5.0)
Alkaline Phosphatase: 57 U/L (ref 38–126)
Alkaline Phosphatase: 61 U/L (ref 38–126)
Anion gap: 9 (ref 5–15)
Anion gap: 9 (ref 5–15)
BUN: 12 mg/dL (ref 8–23)
BUN: 12 mg/dL (ref 8–23)
CO2: 20 mmol/L — ABNORMAL LOW (ref 22–32)
CO2: 21 mmol/L — ABNORMAL LOW (ref 22–32)
Calcium: 10.1 mg/dL (ref 8.9–10.3)
Calcium: 10.3 mg/dL (ref 8.9–10.3)
Chloride: 107 mmol/L (ref 98–111)
Chloride: 105 mmol/L (ref 98–111)
Creatinine, Ser: 0.51 mg/dL (ref 0.44–1.00)
Creatinine, Ser: 0.6 mg/dL (ref 0.44–1.00)
GFR, Estimated: >60 mL/min (ref >60)
GFR, Estimated: >60 mL/min (ref >60)
Glucose, Bld: 128 mg/dL — ABNORMAL HIGH (ref 70–99)
Glucose, Bld: 106 mg/dL — ABNORMAL HIGH (ref 70–99)
Potassium: 5.8 mmol/L — ABNORMAL HIGH (ref 3.5–5.1)
Potassium: 4.8 mmol/L (ref 3.5–5.1)
Sodium: 135 mmol/L (ref 135–145)
Sodium: 135 mmol/L (ref 135–145)
Total Bilirubin: 0.3 mg/dL (ref 0.0–1.2)
Total Bilirubin: 0.4 mg/dL (ref 0.0–1.2)
Total Protein: 5.4 g/dL — ABNORMAL LOW (ref 6.5–8.1)
Total Protein: 6.4 g/dL — ABNORMAL LOW (ref 6.5–8.1)

## 2024-03-29 LAB — BLOOD CULTURE ID PANEL (REFLEXED) - BCID2

## 2024-03-29 LAB — CULTURE, BLOOD (ROUTINE X 2): Special Requests: ADEQUATE

## 2024-03-29 LAB — PHOSPHORUS: Phosphorus: 2.4 mg/dL — ABNORMAL LOW (ref 2.5–4.6)

## 2024-03-29 LAB — MAGNESIUM: Magnesium: 2.1 mg/dL (ref 1.7–2.4)

## 2024-03-29 LAB — URINE CULTURE: Culture: NO GROWTH

## 2024-03-29 MED ORDER — FREE WATER
200.0000 mL | Freq: Four times a day (QID) | Status: DC
  Administered 2024-03-29 – 2024-03-30 (×4): 200 mL

## 2024-03-29 MED ORDER — VANCOMYCIN HCL 1500 MG/300ML IV SOLN
1500.0000 mg | INTRAVENOUS | Status: DC
  Administered 2024-03-29: 1500 mg via INTRAVENOUS
  Filled 2024-03-29: qty 300

## 2024-03-29 MED ORDER — BANATROL TF EN LIQD
60.0000 mL | Freq: Three times a day (TID) | ENTERAL | Status: DC
  Administered 2024-03-29 – 2024-03-30 (×3): 60 mL
  Filled 2024-03-29 (×4): qty 60

## 2024-03-29 NOTE — Progress Notes (Signed)
 Initial Nutrition Assessment  DOCUMENTATION CODES:   Not applicable  INTERVENTION:  - Continue current TF regimen via PEG: 1 carton of Jevity 1.5 5x/day  (5 cartons per day) Provides 1777 kcal, 76 gm protein, 900 ml free water  daily  - Monitor magnesium , potassium, and phosphorus daily for at least 3 days, MD to replete as needed.  - Continue 200mL Q6H FWF ( ) In addition to tube feeds, will provide a total of 1763mL/day.  - Continue Banatrol fiber supplement TID, each provides 45 kcals, 2g protein and 5g soluble fiber to aid diarrhea  - Monitor weight trends.  NUTRITION DIAGNOSIS:   Inadequate oral intake related to inability to eat as evidenced by NPO status (PEG tube dependent).  GOAL:   Patient will meet greater than or equal to 90% of their needs  MONITOR:   Labs, Weight trends, TF tolerance  REASON FOR ASSESSMENT:   Consult Enteral/tube feeding initiation and management  ASSESSMENT:   63 y.o. 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  She is nonverbal, but has been lethargic.  Her tube was placed on 02/04/2024 at [facility]. Of note, was also seen on 03/03/2024 due to PEG dysfunction.   Patient noted to be nonverbal. No family or visitors at bedside.   Per chart review, summarized weight history over the past year as below: 07/01/23: 180# 3/13: 193# 8/24: 194# 9/21: 194# 10/15 (admit weight): 194#  10/16:173# 10/17: 180#  Question if weights from [place] were copied over. Accurate weight status difficult to assess at this time given variance in weights so far this admission.  Per home meds list, patient had IsoSource 1.5 listed but note that patient not taking.   Called patient SNF [facility]) to obtain patient's recent tube feed regimen. Spoke to [place], who reports he has been taking care of patient for the past 3 years.   Long standing regimen as below: 1 carton of Jevity 1.5 5x/day (5 cartons/day) +171mL Q4H FWF + Banatrol TID This regimen provides: 1912 kcals, 76g protein, free water , and 40g of fiber  However, Darold reports that ~1 week ago patient began projectile vomiting. Therefore, tube feed regimen was changed to continuous. Jevity 1.5 @ 51mL/hr, running from 6pm-12pm (18 hours) to allow patient time off during the day. FWF increased to 175mL Q3H. Banatrol was continued TID.  This regimen provides: 1350kcals, 52g protein, and free water  daily.   Darold reported that patient had previously been tolerating bolus TF regimen well with no issues for a long time. He notes that patient has had similar issues in the past with her tube malfunctioning. Suspect patient had issues with vomiting/intolerance last week due to malpositioned PEG.   Patient's PEG noted to be in the second portion of her duodenum on admission. PEG retracted and now confirmed in the correct position.   Admitting provider ordered patient 5 cartons of Jevity 1.5 daily. Discussed patient with RN who reports patient seems to be tolerating regimen well. Having formed BM's and does not appear to be experiencing nausea or having any discomfort.  Will continue current TF regimen and monitor for tolerance. Will adjust timing of banatrol and FWF so they are spaced away from TF boluses.   Medications reviewed and include: Banatrol TID, 200mL Q6H FWF, D5 @  127mL/hr (provides 408 kcals over 24 hours)  Labs reviewed:  -  Phosphorus 2.4   NUTRITION - FOCUSED PHYSICAL EXAM:  Flowsheet Row Most Recent Value  Orbital Region Mild depletion  Upper Arm Region No depletion  Thoracic and Lumbar Region No depletion  Buccal Region No depletion  Temple Region Severe  depletion  Clavicle Bone Region No depletion  Clavicle and Acromion Bone Region No depletion  Scapular Bone Region Unable to assess  Dorsal Hand No depletion  Patellar Region No depletion  Anterior Thigh Region No depletion  Posterior Calf Region No depletion  Edema (RD Assessment) Mild  Hair Reviewed  Eyes Unable to assess  Mouth Unable to assess  Skin Reviewed  Nails Reviewed    Diet Order:   Diet Order             Diet NPO time specified  Diet effective now                   EDUCATION NEEDS:  Not appropriate for education at this time  Skin:  Skin Assessment: Skin Integrity Issues: Skin Integrity Issues:: DTI, Stage II DTI: Right Foot Stage II: Left Medial Coccyx  Last BM:  10/17 - type 1  Height:  Ht Readings from Last 1 Encounters:  03/28/24 5' 4 (1.626 m)   Weight:  Wt Readings from Last 1 Encounters:  03/29/24 81.7 kg   Ideal Body Weight:  54.55 kg  BMI:  Body mass index is 30.92 kg/m.  Estimated Nutritional Needs:  Kcal:  1600-1850 kcals Protein:  65-80 grams Fluid:  >/= 1.6L    Trude Ned RD, LDN Contact via Secure Chat.

## 2024-03-29 NOTE — Progress Notes (Addendum)
     Patient Name: Denise Savage           DOB: Jan 21, 1961  MRN: [id number]      Admission Date: 03/27/2024  Attending Provider: Celinda Alm Lot, MD  Primary Diagnosis: Fever, unspecified   Level of care: Progressive   OVERNIGHT EVENT  Patient has a midline catheter with significant features of erythema, warmth,  and swelling.  Some pain noted while attempting to flush IV.  Midline inserted in outside facility. Line has not been in use due to concerns for infiltration vs extravasation vs cellulitis.  Midline assessed by IV team, and they are recommending removal. Pt has another working PIV. Will remove midline.    Plan: Remove midline Monitor site  Lavanda Horns, DNP, ACNPC- AG [facility]

## 2024-03-29 NOTE — Progress Notes (Signed)
 PHARMACY - PHYSICIAN COMMUNICATION CRITICAL VALUE ALERT - BLOOD CULTURE IDENTIFICATION (BCID)  Denise Savage is an 63 y.o. female who presented to [facility] on 03/27/2024 with a chief complaint of cellulitis  Assessment:  1/3 staph species, no R  Name of physician (or Provider) Contacted: Andrez  Current antibiotics: vanc, cefepime  Changes to prescribed antibiotics recommended:  Patient is on recommended antibiotics - No changes needed  Results for orders placed or performed during the hospital encounter of 03/27/24  Blood Culture ID Panel (Reflexed) (Collected: 03/27/2024 10:30 PM)  Result Value Ref Range   Enterococcus faecalis NOT DETECTED NOT DETECTED   Enterococcus Faecium NOT DETECTED NOT DETECTED   Listeria monocytogenes NOT DETECTED NOT DETECTED   Staphylococcus species DETECTED (A) NOT DETECTED   Staphylococcus aureus (BCID) NOT DETECTED NOT DETECTED   Staphylococcus epidermidis NOT DETECTED NOT DETECTED   Staphylococcus lugdunensis NOT DETECTED NOT DETECTED   Streptococcus species NOT DETECTED NOT DETECTED   Streptococcus agalactiae NOT DETECTED NOT DETECTED   Streptococcus pneumoniae NOT DETECTED NOT DETECTED   Streptococcus pyogenes NOT DETECTED NOT DETECTED   A.calcoaceticus-baumannii NOT DETECTED NOT DETECTED   Bacteroides fragilis NOT DETECTED NOT DETECTED   Enterobacterales NOT DETECTED NOT DETECTED   Enterobacter cloacae complex NOT DETECTED NOT DETECTED   Escherichia coli NOT DETECTED NOT DETECTED   Klebsiella aerogenes NOT DETECTED NOT DETECTED   Klebsiella oxytoca NOT DETECTED NOT DETECTED   Klebsiella pneumoniae NOT DETECTED NOT DETECTED   Proteus species NOT DETECTED NOT DETECTED   Salmonella species NOT DETECTED NOT DETECTED   Serratia marcescens NOT DETECTED NOT DETECTED   Haemophilus influenzae NOT DETECTED NOT DETECTED   Neisseria meningitidis NOT DETECTED NOT DETECTED   Pseudomonas aeruginosa NOT DETECTED NOT DETECTED   Stenotrophomonas  maltophilia NOT DETECTED NOT DETECTED   Candida albicans NOT DETECTED NOT DETECTED   Candida auris NOT DETECTED NOT DETECTED   Candida glabrata NOT DETECTED NOT DETECTED   Candida krusei NOT DETECTED NOT DETECTED   Candida parapsilosis NOT DETECTED NOT DETECTED   Candida tropicalis NOT DETECTED NOT DETECTED   Cryptococcus neoformans/gattii NOT DETECTED NOT DETECTED    Leeroy Mace RPh 03/29/2024, 12:37 AM

## 2024-03-29 NOTE — Progress Notes (Signed)
 PROGRESS NOTE    Denise Savage  FMW:[id number] DOB: 17-Sep-1960 DOA: 03/27/2024 PCP: Patient, No Pcp Per   Brief Narrative:  Denise Savage is a 63 y.o. 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  During evaluation patient had notable erythema around midline IV catheter of her right upper extremity.  He has been pulled and is being treated for presumed cellulitis.   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)   Gastrostomy tube dysfunction (HCC)   Hypokalemia   Hypomagnesemia   Normocytic anemia   Protein-calorie malnutrition, severe   Hypercalcemia   Thrombocytopenia   Pressure injury of skin   Cellulitis of right upper extremity   Sepsis secondary to cellulitis of right upper extremity  Fever, tachypnea, with notable source of cellulitis around recent midline IV catheter  Continue vancomycin  only per discussion with ID/pharmacy Cultures pending -patient high risk for bacteremia given concern for midline infection Midline removed 03/28/2024   Chronic gastrojejunostomy tube with recurrent dislodgement -General Surgery evaluated G-tube, appreciate insight and recommendations, tube is currently) Sharyn, no perforation seen on imaging -tube appears to be in correct position and is  currently being used without difficulty or incident.   Hypokalemia Currently within normal limits after repletion Likely secondary to poor enteral feeding/issues with G-tube   Hypomagnesemia Replaced.  May benefit from routine replacement pending repeat labs   Normocytic anemia Likely chronic anemia of chronic disease, no signs or symptoms of bleeding at this time -continue to monitor   Protein-calorie malnutrition, severe In the setting of above chronic dysphagia, tube feed dependent Patient is bedbound/wheelchair-bound   Hypercalcemia Hold vitamin D supplement. Follow-up calcium level.   Thrombocytopenia Monitor platelet count.   Pressure injury of skin Continue local care. Consult wound and ostomy care.   Seizures (HCC) Continue valproic  acid 750 mg p.o. 3 times daily. Continue lacosamide  100 mg p.o. twice daily.   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 Continue muscle relaxant. Patient's baseline is wheelchair-bound/bedbound nonverbal  DVT prophylaxis: SCDs Start: 03/28/24 0433 Code Status:   Code Status: Limited: Do not attempt resuscitation (DNR) -DNR-LIMITED -Do Not Intubate/DNI  Family Communication: None present  Status is: Inpatient  Dispo: The patient is from: [facility]              Anticipated d/c is to: Same              Anticipated d/c date is: 24 to 48 hours              Patient currently not medically stable for discharge  Consultants:  None  Procedures:  None  Antimicrobials:  Vancomycin   Subjective: No acute issues or events overnight, review of systems markedly limited by patient's baseline mental status.  PEG tube appears to be functioning at this time  Objective: Vitals:   03/29/24 0213 03/29/24 0500 03/29/24 0603 03/29/24 1311  BP: 125/69  109/82 (!) 95/47  Pulse: (!) 42  (!) 54 (!) 55  Resp: 14  16 18   Temp: 99.6 F (37.6 C)  98.4 F (36.9 C) 98.9 F (37.2 C)   TempSrc: Oral  Oral Oral  SpO2: 91%  100% 99%  Weight:  81.7 kg    Height:        Intake/Output Summary (Last 24 hours) at 03/29/2024 1523 Last data filed at 03/29/2024 1343 Gross per 24 hour  Intake 1556.64 ml  Output 400 ml  Net 1156.64 ml   Filed Weights   03/27/24 2300 03/28/24 2017 03/29/24 0500  Weight: 88 kg 78.5 kg 81.7 kg    Examination:  General: Resting in bed, agitated, poorly compliant with physical exam HEENT:  Normocephalic atraumatic.  Sclerae nonicteric, noninjected.  Extraocular movements intact bilaterally. Neck:  Without mass or deformity.  Trachea is midline. Lungs: Coarse breath sounds bilaterally without overt wheezes or rales Heart:  Regular rate and rhythm.  Without murmurs, rubs, or gallops. Abdomen:  Soft, nontender, nondistended.  Without guarding or rebound. Extremities: Right upper extremity forearm, anteriorly with blanching erythema to the elbow. Skin: As below Wound 03/28/24 1847 Pressure Injury Foot Right;Lateral Deep Tissue Pressure Injury - Purple or maroon localized area of discolored intact skin or blood-filled blister due to damage of underlying soft tissue from pressure and/or shear. (Active)     Wound 03/28/24 1847 Pressure Injury Coccyx Left;Mid;Medial Stage 2 -  Partial thickness loss of dermis presenting as a shallow open injury with a red, pink wound bed without slough. (Active)      Data Reviewed: I have personally reviewed following labs and imaging studies  CBC: Recent Labs  Lab 03/27/24 2230 03/28/24 2119  WBC 8.6 5.8  NEUTROABS 6.2 4.0  HGB 10.8* 12.4  HCT 34.4* 39.4  MCV 91.7 93.4  PLT 144* 127*   Basic Metabolic Panel: Recent Labs  Lab 03/27/24 2230 03/28/24 0635 03/29/24 0411 03/29/24 0746  NA 136 138 135 135  K 3.4* 3.2* 5.8* 4.8  CL 101 108 107 105  CO2 25 21* 20* 21*  GLUCOSE 86 74 128* 106*  BUN 9 9 12 12   CREATININE 0.49 0.41* 0.51 0.60  CALCIUM 10.5* 9.0 10.1 10.3  MG  --  1.5*  --  2.1  PHOS   --  2.2*  --  2.4*   GFR: Estimated Creatinine Clearance: 74.4 mL/min (by C-G formula based on SCr of 0.6 mg/dL).  Liver Function Tests: Recent Labs  Lab 03/27/24 2230 03/28/24 0635 03/29/24 0411 03/29/24 0746  AST 10* 14* 20 21  ALT <5 <5 <5 <5  ALKPHOS 67 55 57 61  BILITOT 0.5 0.4 0.3 0.4  PROT 6.4* 5.2* 5.4* 6.4*  ALBUMIN  3.1* 2.4* 2.6* 2.7*   Recent Labs  Lab 03/27/24 2230  LIPASE 15   Sepsis Labs: Recent Labs  Lab 03/27/24 2246  LATICACIDVEN 0.9    Recent Results (from the past 240 hours)  Blood culture (routine x 2)     Status: Abnormal   Collection Time: 03/27/24 10:30 PM   Specimen: BLOOD  Result Value Ref Range Status   Specimen Description   Final    BLOOD SITE NOT SPECIFIED Performed at [facility], [address] [place], [place] [address]    Special Requests   Final    Blood Culture adequate volume BOTTLES DRAWN AEROBIC AND ANAEROBIC Performed at [facility], [address], [place], [place] [address]  Culture  Setup Time   Final    GRAM POSITIVE COCCI IN CLUSTERS AEROBIC BOTTLE ONLY CRITICAL RESULT CALLED TO, READ BACK BY AND VERIFIED WITH: PHARMD E JACKSON 03/29/2024 @ 0024 BY AB    Culture (A)  Final    STAPHYLOCOCCUS CAPITIS THE SIGNIFICANCE OF ISOLATING THIS ORGANISM FROM A SINGLE SET OF BLOOD CULTURES WHEN MULTIPLE SETS ARE DRAWN IS UNCERTAIN. PLEASE NOTIFY THE MICROBIOLOGY DEPARTMENT WITHIN ONE WEEK IF SPECIATION AND SENSITIVITIES ARE REQUIRED. Performed at [facility], [address], [place], [place] [address]    Report Status 03/29/2024 FINAL  Final  Blood Culture ID Panel (Reflexed)     Status: Abnormal   Collection Time: 03/27/24 10:30 PM  Result Value Ref Range Status   Enterococcus faecalis NOT DETECTED NOT DETECTED Final   Enterococcus Faecium NOT DETECTED NOT DETECTED Final   Listeria monocytogenes NOT DETECTED NOT DETECTED Final   Staphylococcus species DETECTED (A) NOT  DETECTED Final    Comment: CRITICAL RESULT CALLED TO, READ BACK BY AND VERIFIED WITH: PHARMD E JACKSON 03/29/2024 @ 0024 BY AB    Staphylococcus aureus (BCID) NOT DETECTED NOT DETECTED Final   Staphylococcus epidermidis NOT DETECTED NOT DETECTED Final   Staphylococcus lugdunensis NOT DETECTED NOT DETECTED Final   Streptococcus species NOT DETECTED NOT DETECTED Final   Streptococcus agalactiae NOT DETECTED NOT DETECTED Final   Streptococcus pneumoniae NOT DETECTED NOT DETECTED Final   Streptococcus pyogenes NOT DETECTED NOT DETECTED Final   A.calcoaceticus-baumannii NOT DETECTED NOT DETECTED Final   Bacteroides fragilis NOT DETECTED NOT DETECTED Final   Enterobacterales NOT DETECTED NOT DETECTED Final   Enterobacter cloacae complex NOT DETECTED NOT DETECTED Final   Escherichia coli NOT DETECTED NOT DETECTED Final   Klebsiella aerogenes NOT DETECTED NOT DETECTED Final   Klebsiella oxytoca NOT DETECTED NOT DETECTED Final   Klebsiella pneumoniae NOT DETECTED NOT DETECTED Final   Proteus species NOT DETECTED NOT DETECTED Final   Salmonella species NOT DETECTED NOT DETECTED Final   Serratia marcescens NOT DETECTED NOT DETECTED Final   Haemophilus influenzae NOT DETECTED NOT DETECTED Final   Neisseria meningitidis NOT DETECTED NOT DETECTED Final   Pseudomonas aeruginosa NOT DETECTED NOT DETECTED Final   Stenotrophomonas maltophilia NOT DETECTED NOT DETECTED Final   Candida albicans NOT DETECTED NOT DETECTED Final   Candida auris NOT DETECTED NOT DETECTED Final   Candida glabrata NOT DETECTED NOT DETECTED Final   Candida krusei NOT DETECTED NOT DETECTED Final   Candida parapsilosis NOT DETECTED NOT DETECTED Final   Candida tropicalis NOT DETECTED NOT DETECTED Final   Cryptococcus neoformans/gattii NOT DETECTED NOT DETECTED Final    Comment: Performed at [facility], [address], [place], [place] [address]  Resp panel by RT-PCR (RSV, Flu A&B, Covid) Anterior Nasal Swab     Status:  None   Collection Time: 03/27/24 10:38 PM   Specimen: Anterior Nasal Swab  Result Value Ref Range Status   SARS Coronavirus 2 by RT PCR NEGATIVE NEGATIVE Final    Comment: (NOTE) SARS-CoV-2 target nucleic acids are NOT DETECTED.  The SARS-CoV-2 RNA is generally detectable in upper respiratory specimens during the acute phase of infection. The lowest concentration of SARS-CoV-2 viral copies this assay can detect is 138 copies/mL. A negative result does not preclude SARS-Cov-2 infection and should not be used as the sole basis for treatment or other patient management decisions. A negative result may occur with  improper specimen collection/handling, submission of specimen other than nasopharyngeal swab,  presence of viral mutation(s) within the areas targeted by this assay, and inadequate number of viral copies(<138 copies/mL). A negative result must be combined with clinical observations, patient history, and epidemiological information. The expected result is Negative.  Fact Sheet for Patients:  BloggerCourse.com  Fact Sheet for Healthcare Providers:  SeriousBroker.it  This test is no t yet approved or cleared by the [place]  FDA and  has been authorized for detection and/or diagnosis of SARS-CoV-2 by FDA under an Emergency Use Authorization (EUA). This EUA will remain  in effect (meaning this test can be used) for the duration of the COVID-19 declaration under Section 564(b)(1) of the Act, 21 U.S.C.section 360bbb-3(b)(1), unless the authorization is terminated  or revoked sooner.       Influenza A by PCR NEGATIVE NEGATIVE Final   Influenza B by PCR NEGATIVE NEGATIVE Final    Comment: (NOTE) The Xpert Xpress SARS-CoV-2/FLU/RSV plus assay is intended as an aid in the diagnosis of influenza from Nasopharyngeal swab specimens and should not be used as a sole basis for treatment. Nasal washings and aspirates are unacceptable  for Xpert Xpress SARS-CoV-2/FLU/RSV testing.  Fact Sheet for Patients: BloggerCourse.com  Fact Sheet for Healthcare Providers: SeriousBroker.it  This test is not yet approved or cleared by the [place]  FDA and has been authorized for detection and/or diagnosis of SARS-CoV-2 by FDA under an Emergency Use Authorization (EUA). This EUA will remain in effect (meaning this test can be used) for the duration of the COVID-19 declaration under Section 564(b)(1) of the Act, 21 U.S.C. section 360bbb-3(b)(1), unless the authorization is terminated or revoked.     Resp Syncytial Virus by PCR NEGATIVE NEGATIVE Final    Comment: (NOTE) Fact Sheet for Patients: BloggerCourse.com  Fact Sheet for Healthcare Providers: SeriousBroker.it  This test is not yet approved or cleared by the [place]  FDA and has been authorized for detection and/or diagnosis of SARS-CoV-2 by FDA under an Emergency Use Authorization (EUA). This EUA will remain in effect (meaning this test can be used) for the duration of the COVID-19 declaration under Section 564(b)(1) of the Act, 21 U.S.C. section 360bbb-3(b)(1), unless the authorization is terminated or revoked.  Performed at [facility], [address] [place], [place] [address]   Urine Culture     Status: None   Collection Time: 03/28/24  4:28 AM   Specimen: Urine, Random  Result Value Ref Range Status   Specimen Description   Final    URINE, RANDOM Performed at [facility], [address] [place], [place] [address]    Special Requests   Final    NONE Reflexed from [id number] Performed at [facility], [address], [place], [place] [address]    Culture   Final    NO GROWTH Performed at [facility], [address] [place], [place] [address]    Report Status 03/29/2024 FINAL  Final   Blood culture (routine x 2)     Status: None (Preliminary result)   Collection Time: 03/28/24  9:19 PM   Specimen: BLOOD LEFT HAND  Result Value Ref Range Status   Specimen Description   Final    BLOOD LEFT HAND Performed at [facility], [address], [place], [place] [address]    Special Requests   Final    Blood Culture results may not be optimal due to an inadequate volume of blood received in culture bottles BOTTLES DRAWN AEROBIC ONLY Performed at [facility], [address], [place], [place] [address]    Culture   Final    NO GROWTH < 12 HOURS Performed at [facility], [address], [place], [place] [address]    Report Status PENDING  Incomplete         Radiology Studies: CT Head Wo Contrast Result Date: 03/28/2024 EXAM: CT HEAD WITHOUT CONTRAST 03/28/2024 02:51:59 AM TECHNIQUE: CT of the head was performed without the administration of intravenous contrast. Automated exposure control, iterative reconstruction, and/or weight based adjustment of the mA/kV was utilized to reduce the radiation dose to as low as reasonably achievable. COMPARISON: Comparison made with prior CT from 01/26/2023. CLINICAL HISTORY: Headache, neuro deficit; ams. Headache, repositioned G-Tube after abnormal ct done 03/27/24, evaluated for duodenal perf also; 30 ml omni 300 via G-Tube, 100 ml omni 300 IV. FINDINGS: BRAIN AND VENTRICLES: Postoperative changes for remote left sided craniotomy. Postoperative dural thickening noted subjacent to the craniotomy bone flap. Extensive encephalomalacia throughout the underlying left cerebral hemisphere. Associated ex vacuo dilatation of the left lateral ventricle. Dense streak artifact from embolization coils noted in the region of the left MCA. No acute intracranial hemorrhage. No acute large vessel territory infarct. No mass lesion or midline shift. No hydrocephalus. No extra axial fluid collection. No visible hyperintense vessel,  although evaluation somewhat limited by streak artifact. Calcified atherosclerosis noted about the carotid siphons. ORBITS: No acute abnormality. SINUSES: Small right maxillary sinus retention cyst. Paranasal sinuses are otherwise largely clear. No mass or diffusion. SOFT TISSUES AND SKULL: Remote left sided craniotomy. Calvarium otherwise intact. No acute soft tissue abnormality. No skull fracture. IMPRESSION: 1. No acute intracranial abnormality. 2. Postoperative changes related to remote left-sided craniotomy with extensive encephalomalacia throughout the left cerebral hemisphere, stable. Electronically signed by: Morene Hoard MD 03/28/2024 03:11 AM EDT RP Workstation: [id number]   CT ABDOMEN PELVIS W CONTRAST Result Date: 03/28/2024 EXAM: CT ABDOMEN AND PELVIS WITH CONTRAST 03/28/2024 02:51:59 AM TECHNIQUE: CT of the abdomen and pelvis was performed with the administration of intravenous contrast. Enteric contrast was administered via percutaneous gastrostomy. Multiplanar reformatted images are provided for review. Automated exposure control, iterative reconstruction, and/or weight-based adjustment of the mA/kV was utilized to reduce the radiation dose to as low as reasonably achievable. COMPARISON: CT abdomen and pelvis yesterday. CLINICAL HISTORY: Peritonitis or perforation suspected; WITH ENTERIC CONTRAST. Headache, repositioned G-Tube after abnormal ct done 03/27/24, evaluated for duodenal perf also; 30 ml omni 300 via G-Tube, 100 ml omni 300 IV. FINDINGS: LOWER CHEST: Small bilateral pleural effusions with associated basal atelectasis. LIVER: The liver is unremarkable. GALLBLADDER AND BILE DUCTS: Gallbladder is unremarkable. No biliary ductal dilatation. SPLEEN: No acute abnormality. PANCREAS: No acute abnormality. ADRENAL GLANDS: No acute abnormality. KIDNEYS, URETERS AND BLADDER: Nonobstructing bilateral renal calculi measuring up to 6 mm (image 30). 7 mm calculus in the left proximal  collecting system (image 36). No hydronephrosis. No perinephric or periureteral stranding. Urinary bladder is unremarkable. GI AND BOWEL: Percutaneous gastrostomy now retracted into satisfactory position. Contrast administered via percutaneous gastrostomy opacifies the duodenum, without extraluminal contrast to suggest leak. However, this is favored to be tamponaded by the gastric balloon, as direct transgression of the posterior gastric antrum by the gastrostomy catheter was evident on the prior study (image 29). Normal appendix (image 59). There is no bowel obstruction. PERITONEUM AND RETROPERITONEUM: No ascites. No free air. VASCULATURE: Aorta is normal in caliber. LYMPH NODES: No lymphadenopathy. REPRODUCTIVE ORGANS: Uterine fibroids. BONES AND SOFT TISSUES: Ventral hernia mesh repair.  Mild degenerative changes of the lower thoracic spine. No acute osseous abnormality. IMPRESSION: 1. Percutaneous gastrostomy now retracted into satisfactory position. 2. No extraluminal contrast to confirm gastic perforation/leak. However, this is believed to be tamponaded by the gastric balloon, as direct transgression of the posterior gastric antrum is evident on the recent prior. If surgery or endoscopic evaluation is not performed, short-term follow-up CT is suggested. 3. Additional stable ancillary findings as above. Electronically signed by: Pinkie Pebbles MD 03/28/2024 03:10 AM EDT RP Workstation: [id number]   DG Chest Portable 1 View Result Date: 03/27/2024 CLINICAL DATA:  Cough EXAM: PORTABLE CHEST 1 VIEW COMPARISON:  08/23/2023 FINDINGS: Heart and mediastinal contours are within normal limits. No focal opacities or effusions. No acute bony abnormality. IMPRESSION: No active disease. Electronically Signed   By: Franky Crease M.D.   On: 03/27/2024 22:59   CT ABDOMEN PELVIS WO CONTRAST Result Date: 03/27/2024 EXAM: CT ABDOMEN AND PELVIS WITHOUT CONTRAST 03/27/2024 05:07:49 PM TECHNIQUE: CT of the abdomen and pelvis  was performed without the administration of intravenous contrast. Multiplanar reformatted images are provided for review. Automated exposure control, iterative reconstruction, and/or weight-based adjustment of the mA/kV was utilized to reduce the radiation dose to as low as reasonably achievable. COMPARISON: CT abdomen and pelvis 11/29/2023. CLINICAL HISTORY: Emesis, persistent. Patient had midline that appeared red and felt warm to the touch, and patient reacted in pain when we tried to flush saline. Arm around midline seemed firm. IV team came to try for a new line and was unable to start one because patient's limbs were contracted. FINDINGS: LOWER CHEST: Trace bilateral pleural effusions. LIVER: The liver is unremarkable. GALLBLADDER AND BILE DUCTS: Gallbladder is unremarkable. No biliary ductal dilatation. SPLEEN: No acute abnormality. PANCREAS: No acute abnormality. ADRENAL GLANDS: No acute abnormality. KIDNEYS, URETERS AND BLADDER: There are punctate bilateral renal calculi measuring up to 6 mm. There is a left renal pelvis calculus measuring 6 mm. There is a cyst in the inferior pole of the left kidney measuring 3.1 cm. Per consensus, no follow-up is needed for simple Bosniak type 1 and 2 renal cysts, unless the patient has a malignancy history or risk factors. No hydronephrosis. No perinephric or periureteral stranding. There is a 3 mm calculus in the posterior right bladder. The bladder is otherwise within normal limits. GI AND BOWEL: Percutaneous gastrostomy tube balloon is now located within the second portion of the duodenum. There is distention and hyperdense material surrounding the catheter just proximal to the balloon which appears slightly mass like. There is a small air-fluid level in the stomach. There is inflammatory stranding surrounding the duodenum with small lymph nodes. Cannot exclude small amount of microperforation of the duodenum on image 2/42. The appendix appears normal. There is no  bowel obstruction. PERITONEUM AND RETROPERITONEUM: No ascites. No free air. VASCULATURE: Aorta is normal in caliber. There are atherosclerotic calcifications of the aorta. LYMPH NODES: Small lymph nodes are noted in the inflammatory stranding surrounding the duodenum. REPRODUCTIVE ORGANS: Uterus is enlarged, unchanged. BONES AND SOFT TISSUES: Patient is status post anterior abdominal wall hernia repair with mesh. Tiny umbilical hernia with fat persists. No acute osseous abnormality. No focal soft tissue abnormality. IMPRESSION: 1. Percutaneous gastrostomy tube balloon malpositioned within the second portion of the duodenum with surrounding inflammatory stranding and small lymph nodes; questionable microperforation. 2. Punctate bilateral renal calculi measuring up to 6 mm, including a 6 mm left renal pelvis calculus 3. 3 mm calculus in the posterior right bladder Electronically signed by: Amy  Maple MD 03/27/2024 05:36 PM EDT RP Workstation: [id number]        Scheduled Meds:  acetaminophen   650 mg Rectal Once   antiseptic oral rinse  15 mL Mouth Rinse QID   famotidine   20 mg Per Tube BID   feeding supplement (JEVITY 1.5 CAL/FIBER)  237 mL Per Tube 5 X Daily   fiber supplement (BANATROL TF)  60 mL Per Tube TID   free water   200 mL Per Tube Q6H   lacosamide   100 mg Per Tube BID   metoprolol  tartrate  25 mg Per Tube BID   tiZANidine   2 mg Per Tube TID   traZODone   25 mg Oral QHS   valproic  acid  750 mg Per Tube TID   Continuous Infusions:  dextrose  5 % and 0.9 % NaCl with KCl 20 mEq/L 100 mL/hr at 03/28/24 2011   [START ON 03/30/2024] vancomycin        LOS: 1 day   Time spent:  Elsie JAYSON Mont[place], DO [facility]  If 7PM-7AM, please contact night-coverage [email or web address]  03/29/2024, 3:23 PM

## 2024-03-29 NOTE — Progress Notes (Signed)
 Pharmacy Antibiotic Note  Denise Savage is a 63 y.o. female admitted on 03/27/2024 with sepsis.  Pharmacy has been consulted for vancomycin  dosing.  Plan: Increase dose to Vancomycin  1500 mg q24 (eAUC 455, Scr 0.8, IBW, Vd 0.72)  Follow renal function, cultures and clinical course  Height: 5' 4 (162.6 cm) Weight: 81.7 kg (180 lb 1.9 oz) IBW/kg (Calculated) : 54.7  Temp (24hrs), Avg:98.9 F (37.2 C), Min:98.4 F (36.9 C), Max:99.6 F (37.6 C)  Recent Labs  Lab 03/27/24 2230 03/27/24 2246 03/28/24 0635 03/28/24 2119 03/29/24 0411 03/29/24 0746  WBC 8.6  --   --  5.8  --   --   CREATININE 0.49  --  0.41*  --  0.51 0.60  LATICACIDVEN  --  0.9  --   --   --   --     Estimated Creatinine Clearance: 74.4 mL/min (by C-G formula based on SCr of 0.6 mg/dL).    Allergies  Allergen Reactions   Hydrocodone  Nausea Only    Antimicrobials this admission: 10/16 vanc >> 10/16 cefepime >> 10/16 flagyl x 1  Dose adjustments this admission: 10/17 Vancomycin  1250 q24 changed to 1500 mg q24   Microbiology results: 10/15 BCx:   Thank you for allowing pharmacy to be a part of this patient's care.  R. Samual Satterfield, PharmD PGY-1 Acute Care Pharmacy Resident 03/29/2024 11:12 AM

## 2024-03-30 ENCOUNTER — Encounter (HOSPITAL_COMMUNITY): Payer: Self-pay | Admitting: Internal Medicine

## 2024-03-30 DIAGNOSIS — R509 Fever, unspecified: Secondary | ICD-10-CM | POA: Diagnosis not present

## 2024-03-30 LAB — CBC
HCT: 35.5 % — ABNORMAL LOW (ref 36.0–46.0)
Hemoglobin: 11 g/dL — ABNORMAL LOW (ref 12.0–15.0)
MCH: 28.7 pg (ref 26.0–34.0)
MCHC: 31 g/dL (ref 30.0–36.0)
MCV: 92.7 fL (ref 80.0–100.0)
Platelets: 131 K/uL — ABNORMAL LOW (ref 150–400)
RBC: 3.83 MIL/uL — ABNORMAL LOW (ref 3.87–5.11)
RDW: 15.7 % — ABNORMAL HIGH (ref 11.5–15.5)
WBC: 5 K/uL (ref 4.0–10.5)
nRBC: 0 % (ref 0.0–0.2)

## 2024-03-30 LAB — BASIC METABOLIC PANEL WITH GFR
Anion gap: 7 (ref 5–15)
BUN: 15 mg/dL (ref 8–23)
CO2: 25 mmol/L (ref 22–32)
Calcium: 10.3 mg/dL (ref 8.9–10.3)
Chloride: 103 mmol/L (ref 98–111)
Creatinine, Ser: 0.52 mg/dL (ref 0.44–1.00)
GFR, Estimated: >60 mL/min (ref >60)
Glucose, Bld: 92 mg/dL (ref 70–99)
Potassium: 4.2 mmol/L (ref 3.5–5.1)
Sodium: 135 mmol/L (ref 135–145)

## 2024-03-30 LAB — MAGNESIUM: Magnesium: 2 mg/dL (ref 1.7–2.4)

## 2024-03-30 LAB — PHOSPHORUS: Phosphorus: 2.1 mg/dL — ABNORMAL LOW (ref 2.5–4.6)

## 2024-03-30 MED ORDER — DOXYCYCLINE HYCLATE 100 MG PO TABS: 100.0000 mg | ORAL_TABLET | Freq: Two times a day (BID) | ORAL | 0 refills | Status: AC

## 2024-03-30 NOTE — TOC Transition Note (Signed)
 Transition of Care [facility]) - Discharge Note   Patient Details  Name: Denise Savage MRN: [id number] Date of Birth: 09-Mar-1961  [facility] CM/SW Contact:  Heather DELENA Saltness, LCSW Phone Number: 03/30/2024, 1:45 PM   Clinical Narrative:    Pt discharging back to [facility] today. D/C packet placed in pt's chart at RN station. RN to call report to [phone]. [facility] to transport pt to facility, call placed at 1:13 PM. Pt's daughter/POA, Alica Neal [phone], made aware and in agreement with discharge plan. No  further TOC needs at this time.   Final next level of care: Skilled Nursing Facility Barriers to Discharge: Barriers Resolved   Patient Goals and CMS Choice Patient states their goals for this hospitalization and ongoing recovery are:: To return to [facility]              Patient to be transferred to facility by: [facility] Name of family member notified: Pt's daughter/POA, Denise Savage Patient and family notified of of transfer: 03/30/24  Discharge Plan and Services Additional resources added to the After Visit Summary for  Follow Up                DME Arranged: N/A DME Agency: NA       HH Arranged: NA HH Agency: NA        Social Drivers of Health (SDOH) Interventions SDOH Screenings   Food Insecurity: Patient Unable To Answer (03/28/2024)  Housing: Unknown (03/28/2024)  Transportation Needs: Patient Unable To Answer (03/28/2024)  Utilities: Patient Unable To Answer (03/28/2024)  Social Connections: Socially Isolated (03/28/2024)  Tobacco Use: Low Risk  (03/30/2024)     Readmission Risk Interventions    03/30/2024    1:43 PM  Readmission Risk Prevention Plan  Transportation Screening Complete  PCP or Specialist Appt within 5-7 Days Complete  Home Care Screening Complete  Medication Review (RN CM) Complete     Signed: Heather Saltness, MSW, LCSW Clinical Social Worker Inpatient  Care Management 03/30/2024 1:47 PM

## 2024-03-30 NOTE — Discharge Summary (Signed)
 Physician Discharge Summary  Denise Savage FMW:[id number] DOB: October 05, 1960 DOA: 03/27/2024  PCP: Patient, No Pcp Per  Admit date: 03/27/2024 Discharge date: 03/30/2024  Admitted From: Home Disposition: Home  Recommendations for Outpatient Follow-up:  Follow up with PCP in 1-2 weeks  Discharge Condition: Stable CODE STATUS: DNR/DNI Diet recommendation: Tube feeds  Brief/Interim Summary: Denise Savage is a 63 y.o. 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   Patient presents to our facility from SNF, [facility], with reports of PEG tube dislocation/dysfunction -hospitalist called for admission, general surgery able to replace PEG tube now functioning well.  During evaluation patient had notable erythema around midline IV catheter of her right upper extremity.  He has been pulled and is being treated for presumed cellulitis -no obvious systemic infection, blood cultures remain negative(single staph positive culture of four likely contaminant) -continue close monitoring at discharge.  Transition to doxycycline via PEG tube for remainder of course.  Patient otherwise stable for discharge back to facility.  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)   Gastrostomy tube dysfunction (HCC)   Hypokalemia   Hypomagnesemia   Normocytic anemia   Protein-calorie malnutrition, severe   Hypercalcemia   Thrombocytopenia   Pressure injury of skin   Cellulitis of right upper extremity  Sepsis  secondary to cellulitis of right upper extremity  Fever, tachypnea, with notable source of cellulitis around recent midline IV catheter (discontinued) Discontinue vancomycin  transition to doxycycline as above Cultures pending -patient high risk for bacteremia given concern for midline infection Midline removed 03/28/2024   Chronic gastrojejunostomy tube with recurrent dislodgement -General Surgery evaluated G-tube, appreciate insight and recommendations, tube is currently in position per imaging - no perforation noted - tube appears to be in correct position and is currently being used without difficulty or incident.   Hypokalemia Currently within normal limits after repletion, resolved Likely secondary to poor enteral feeding/issues with G-tube   Hypomagnesemia Replaced.  May benefit from routine replacement pending repeat labs outpatient   Normocytic anemia Likely chronic anemia of chronic disease, no signs or symptoms of bleeding at this time -continue to monitor   Protein-calorie malnutrition, severe In the setting of above chronic dysphagia, tube feed dependent Patient is bedbound/wheelchair-bound    Hypercalcemia Hold vitamin D supplement. Follow-up calcium level.   Thrombocytopenia Monitor platelet count.   Pressure injury of skin Continue local care. Consult wound and ostomy care.   Seizures (HCC) Continue valproic  acid 750 mg p.o. 3 times daily. Continue lacosamide  100 mg p.o. twice daily.   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 Continue muscle relaxant. Patient's baseline is wheelchair-bound/bedbound nonverbal  Discharge Instructions  Discharge Instructions     Call MD for:  difficulty breathing, headache or visual disturbances   Complete by: As directed    Call MD for:  extreme fatigue   Complete by: As directed    Call MD for:  hives   Complete by: As directed    Call MD for:  persistant  dizziness or light-headedness   Complete by: As directed    Call  MD for:  persistant nausea and vomiting   Complete by: As directed    Call MD for:  severe uncontrolled pain   Complete by: As directed    Call MD for:  temperature >100.4   Complete by: As directed    Diet - low sodium heart healthy   Complete by: As directed    Increase activity slowly   Complete by: As directed    No wound care   Complete by: As directed       Allergies as of 03/30/2024       Reactions   Hydrocodone  Nausea Only        Medication List     TAKE these medications    acetaminophen  325 MG tablet Commonly known as: TYLENOL  Place 650 mg into feeding tube in the morning, at noon, in the evening, and at bedtime. (0000, 0600, 1200, 1800)   antiseptic oral rinse 0.05 % Liqd solution Commonly known as: CPC / CETYLPYRIDINIUM CHLORIDE 0.05% 15 mLs by Mouth Rinse route in the morning, at noon, in the evening, and at bedtime. For mouth care use mouth swabs to clean out mouth   cholecalciferol 25 MCG (1000 UNIT) tablet Commonly known as: VITAMIN D3 Place 1,000 Units into feeding tube in the morning.   dextrose  5 % and 0.9% NaCl 5-0.9 % Inject 75 mLs into the vein in the morning, at noon, and at bedtime.   doxycycline 100 MG tablet Commonly known as: VIBRA-TABS Take 1 tablet (100 mg total) by mouth 2 (two) times daily for 14 days.   famotidine  20 MG tablet Commonly known as: PEPCID  Place 20 mg into feeding tube 2 (two) times daily.   fiber supplement (BANATROL TF) liquid Place 60 mLs into feeding tube 3 (three) times daily.   free water  Soln Place 30-240 mLs into feeding tube See admin instructions. Flush feeding tube with 240 mls of water  twice daily between feedings, flush with 60 ml of water  before and after initiating feedings &  Flush peg tube with 30 cc of water  before and after medication administration   glycopyrrolate  1 MG tablet Commonly known as: ROBINUL  Take 1 mg by mouth every  8 (eight) hours as needed (increased secretions).   ipratropium-albuterol  0.5-2.5 (3) MG/3ML Soln Commonly known as: DUONEB Take 3 mLs by nebulization every 6 (six) hours as needed.   Isosource 1.5 Cal Liqd Place 237 mLs into feeding tube 5 (five) times daily. (0600, 1000, 1400, 1800 & 2200)   lacosamide  10 MG/ML oral solution Commonly known as: VIMPAT  10 mLs 2 (two) times daily. Per feeding tube   LACTOBACILLUS PO Place 1 capsule into feeding tube in the morning, at noon, and at bedtime.   medroxyPROGESTERone  Acetate 150 MG/ML Susy Inject 150 mg into the muscle every 3 (three) months.   metoprolol  tartrate 25 MG tablet Commonly known as: LOPRESSOR  Take 1 tablet (25 mg total) by mouth 2 (two) times daily. What changed: how to take this   multivitamin with minerals Tabs tablet Take 1 tablet by mouth in the morning.   Nuedexta  20-10 MG capsule Generic drug: Dextromethorphan -quiNIDine 1 capsule every 12 (twelve) hours. Via G-tube   ondansetron  4 MG/5ML solution Commonly known as: ZOFRAN  Take 4 mg by mouth every 8 (eight) hours as needed for nausea or vomiting.   oxyCODONE  5 MG immediate release tablet Commonly known as: Oxy IR/ROXICODONE  Place 5 mg into feeding tube in the morning and at bedtime.   PRO-STAT AWC PO by Feeding Tube route  in the morning, at noon, and at bedtime.   tiZANidine  2 MG tablet Commonly known as: ZANAFLEX  Place 2 mg into feeding tube 3 (three) times daily.   traZODone  50 MG tablet Commonly known as: DESYREL  Take 25 mg by mouth at bedtime. (2200)   valproic  acid 250 MG/5ML solution Commonly known as: DEPAKENE  Place 15 mLs (750 mg total) into feeding tube 3 (three) times daily.        Allergies  Allergen Reactions   Hydrocodone  Nausea Only    Consultations: None  Procedures/Studies: CT Head Wo Contrast Result Date: 03/28/2024 EXAM: CT HEAD WITHOUT CONTRAST 03/28/2024 02:51:59 AM TECHNIQUE: CT of the head was performed without the  administration of intravenous contrast. Automated exposure control, iterative reconstruction, and/or weight based adjustment of the mA/kV was utilized to reduce the radiation dose to as low as reasonably achievable. COMPARISON: Comparison made with prior CT from 01/26/2023. CLINICAL HISTORY: Headache, neuro deficit; ams. Headache, repositioned G-Tube after abnormal ct done 03/27/24, evaluated for duodenal perf also; 30 ml omni 300 via G-Tube, 100 ml omni 300 IV. FINDINGS: BRAIN AND VENTRICLES: Postoperative changes for remote left sided craniotomy. Postoperative dural thickening noted subjacent to the craniotomy bone flap. Extensive encephalomalacia throughout the underlying left cerebral hemisphere. Associated ex vacuo dilatation of the left lateral ventricle. Dense streak artifact from embolization coils noted in the region of the left MCA. No acute intracranial hemorrhage. No acute large vessel territory infarct. No mass lesion or midline shift. No hydrocephalus. No extra axial fluid collection. No visible hyperintense vessel, although evaluation somewhat limited by streak artifact. Calcified atherosclerosis noted about the carotid siphons. ORBITS: No acute abnormality. SINUSES: Small right maxillary sinus retention cyst. Paranasal sinuses are otherwise largely clear. No mass or diffusion. SOFT TISSUES AND SKULL: Remote left sided craniotomy. Calvarium otherwise intact. No acute soft tissue abnormality. No skull fracture. IMPRESSION: 1. No acute intracranial abnormality. 2. Postoperative changes related to remote left-sided craniotomy with extensive encephalomalacia throughout the left cerebral hemisphere, stable. Electronically signed by: Morene Hoard MD 03/28/2024 03:11 AM EDT RP Workstation: [id number]   CT ABDOMEN PELVIS W CONTRAST Result Date: 03/28/2024 EXAM: CT ABDOMEN AND PELVIS WITH CONTRAST 03/28/2024 02:51:59 AM TECHNIQUE: CT of the abdomen and pelvis was performed with the administration of  intravenous contrast. Enteric contrast was administered via percutaneous gastrostomy. Multiplanar reformatted images are provided for review. Automated exposure control, iterative reconstruction, and/or weight-based adjustment of the mA/kV was utilized to reduce the radiation dose to as low as reasonably achievable. COMPARISON: CT abdomen and pelvis yesterday. CLINICAL HISTORY: Peritonitis or perforation suspected; WITH ENTERIC CONTRAST. Headache, repositioned G-Tube after abnormal ct done 03/27/24, evaluated for duodenal perf also; 30 ml omni 300 via G-Tube, 100 ml omni 300 IV. FINDINGS: LOWER CHEST: Small bilateral pleural effusions with associated basal atelectasis. LIVER: The liver is unremarkable. GALLBLADDER AND BILE DUCTS: Gallbladder is unremarkable. No biliary ductal dilatation. SPLEEN: No acute abnormality. PANCREAS: No acute abnormality. ADRENAL GLANDS: No acute abnormality. KIDNEYS, URETERS AND BLADDER: Nonobstructing bilateral renal calculi measuring up to 6 mm (image 30). 7 mm calculus in the left proximal collecting system (image 36). No hydronephrosis. No perinephric or periureteral stranding. Urinary bladder is unremarkable. GI AND BOWEL: Percutaneous gastrostomy now retracted into satisfactory position. Contrast administered via percutaneous gastrostomy opacifies the duodenum, without extraluminal contrast to suggest leak. However, this is favored to be tamponaded by the gastric balloon, as direct transgression of the posterior gastric antrum by the gastrostomy catheter was evident on the prior study (image 29). Normal  appendix (image 59). There is no bowel obstruction. PERITONEUM AND RETROPERITONEUM: No ascites. No free air. VASCULATURE: Aorta is normal in caliber. LYMPH NODES: No lymphadenopathy. REPRODUCTIVE ORGANS: Uterine fibroids. BONES AND SOFT TISSUES: Ventral hernia mesh repair. Mild degenerative changes of the lower thoracic spine. No acute osseous abnormality. IMPRESSION: 1. Percutaneous  gastrostomy now retracted into satisfactory position. 2. No extraluminal contrast to confirm gastic perforation/leak. However, this is believed to be tamponaded by the gastric balloon, as direct transgression of the posterior gastric antrum is evident on the recent prior. If surgery or endoscopic evaluation is not performed, short-term follow-up CT is suggested. 3. Additional stable ancillary findings as above. Electronically signed by: Pinkie Pebbles MD 03/28/2024 03:10 AM EDT RP Workstation: [id number]   DG Chest Portable 1 View Result Date: 03/27/2024 CLINICAL DATA:  Cough EXAM: PORTABLE CHEST 1 VIEW COMPARISON:  08/23/2023 FINDINGS: Heart and mediastinal contours are within normal limits. No focal opacities or effusions. No acute bony abnormality. IMPRESSION: No active disease. Electronically Signed   By: Franky Crease M.D.   On: 03/27/2024 22:59   CT ABDOMEN PELVIS WO CONTRAST Result Date: 03/27/2024 EXAM: CT ABDOMEN AND PELVIS WITHOUT CONTRAST 03/27/2024 05:07:49 PM TECHNIQUE: CT of the abdomen and pelvis was performed without the administration of intravenous contrast. Multiplanar reformatted images are provided for review. Automated exposure control, iterative reconstruction, and/or weight-based adjustment of the mA/kV was utilized to reduce the radiation dose to as low as reasonably achievable. COMPARISON: CT abdomen and pelvis 11/29/2023. CLINICAL HISTORY: Emesis, persistent. Patient had midline that appeared red and felt warm to the touch, and patient reacted in pain when we tried to flush saline. Arm around midline seemed firm. IV team came to try for a new line and was unable to start one because patient's limbs were contracted. FINDINGS: LOWER CHEST: Trace bilateral pleural effusions. LIVER: The liver is unremarkable. GALLBLADDER AND BILE DUCTS: Gallbladder is unremarkable. No biliary ductal dilatation. SPLEEN: No acute abnormality. PANCREAS: No acute abnormality. ADRENAL GLANDS: No acute  abnormality. KIDNEYS, URETERS AND BLADDER: There are punctate bilateral renal calculi measuring up to 6 mm. There is a left renal pelvis calculus measuring 6 mm. There is a cyst in the inferior pole of the left kidney measuring 3.1 cm. Per consensus, no follow-up is needed for simple Bosniak type 1 and 2 renal cysts, unless the patient has a malignancy history or risk factors. No hydronephrosis. No perinephric or periureteral stranding. There is a 3 mm calculus in the posterior right bladder. The bladder is otherwise within normal limits. GI AND BOWEL: Percutaneous gastrostomy tube balloon is now located within the second portion of the duodenum. There is distention and hyperdense material surrounding the catheter just proximal to the balloon which appears slightly mass like. There is a small air-fluid level in the stomach. There is inflammatory stranding surrounding the duodenum with small lymph nodes. Cannot exclude small amount of microperforation of the duodenum on image 2/42. The appendix appears normal. There is no bowel obstruction. PERITONEUM AND RETROPERITONEUM: No ascites. No free air. VASCULATURE: Aorta is normal in caliber. There are atherosclerotic calcifications of the aorta. LYMPH NODES: Small lymph nodes are noted in the inflammatory stranding surrounding the duodenum. REPRODUCTIVE ORGANS: Uterus is enlarged, unchanged. BONES AND SOFT TISSUES: Patient is status post anterior abdominal wall hernia repair with mesh. Tiny umbilical hernia with fat persists. No acute osseous abnormality. No focal soft tissue abnormality. IMPRESSION: 1. Percutaneous gastrostomy tube balloon malpositioned within the second portion of the duodenum with surrounding inflammatory  stranding and small lymph nodes; questionable microperforation. 2. Punctate bilateral renal calculi measuring up to 6 mm, including a 6 mm left renal pelvis calculus 3. 3 mm calculus in the posterior right bladder Electronically signed by: Greig Pique MD 03/27/2024 05:36 PM EDT RP Workstation: [id number]     Subjective: No acute issues or events overnight   Discharge Exam: Vitals:   03/29/24 2135 03/29/24 2139  BP: (!) 109/54 (!) 109/54  Pulse: 60 60  Resp: 19   Temp: 98.7 F (37.1 C)   SpO2: 100%    Vitals:   03/29/24 1737 03/29/24 2135 03/29/24 2139 03/30/24 0500  BP: (!) 106/59 (!) 109/54 (!) 109/54   Pulse: 75 60 60   Resp: 18 19    Temp: 99.9 F (37.7 C) 98.7 F (37.1 C)    TempSrc: Oral Oral    SpO2: 99% 100%    Weight:    80.6 kg  Height:        General: Pt is alert, awake, not in acute distress Cardiovascular: RRR, S1/S2 +, no rubs, no gallops Respiratory: CTA bilaterally, no wheezing, no rhonchi Abdominal: Soft, NT, ND, bowel sounds + Extremities: no edema, no cyanosis, right upper extremity erythema improving drastically    The results of significant diagnostics from this hospitalization (including imaging, microbiology, ancillary and laboratory) are listed below for reference.     Microbiology: Recent Results (from the past 240 hours)  Blood culture (routine x 2)     Status: Abnormal   Collection Time: 03/27/24 10:30 PM   Specimen: BLOOD  Result Value Ref Range Status   Specimen Description   Final    BLOOD SITE NOT SPECIFIED Performed at [facility], [address], [place], [place] [address]    Special Requests   Final    Blood Culture adequate volume BOTTLES DRAWN AEROBIC AND ANAEROBIC Performed at [facility], [address], [place], [place] [address]    Culture  Setup Time   Final    GRAM POSITIVE COCCI IN CLUSTERS AEROBIC BOTTLE ONLY CRITICAL RESULT CALLED TO, READ BACK BY AND VERIFIED WITH: PHARMD E JACKSON 03/29/2024 @ 0024 BY AB    Culture (A)  Final    STAPHYLOCOCCUS CAPITIS THE SIGNIFICANCE OF ISOLATING THIS ORGANISM FROM A SINGLE SET OF BLOOD CULTURES WHEN MULTIPLE SETS ARE DRAWN IS UNCERTAIN. PLEASE NOTIFY THE MICROBIOLOGY  DEPARTMENT WITHIN ONE WEEK IF SPECIATION AND SENSITIVITIES ARE REQUIRED. Performed at [facility], [address], [place], [place] [address]    Report Status 03/29/2024 FINAL  Final  Blood Culture ID Panel (Reflexed)     Status: Abnormal   Collection Time: 03/27/24 10:30 PM  Result Value Ref Range Status   Enterococcus faecalis NOT DETECTED NOT DETECTED Final   Enterococcus Faecium NOT DETECTED NOT DETECTED Final   Listeria monocytogenes NOT DETECTED NOT DETECTED Final   Staphylococcus species DETECTED (A) NOT DETECTED Final    Comment: CRITICAL RESULT CALLED TO, READ BACK BY AND VERIFIED WITH: PHARMD E JACKSON 03/29/2024 @ 0024 BY AB    Staphylococcus aureus (BCID) NOT DETECTED NOT DETECTED Final   Staphylococcus epidermidis NOT DETECTED NOT DETECTED Final   Staphylococcus lugdunensis NOT DETECTED NOT DETECTED Final   Streptococcus species NOT DETECTED NOT DETECTED Final   Streptococcus agalactiae NOT DETECTED NOT DETECTED Final   Streptococcus pneumoniae NOT DETECTED NOT DETECTED Final   Streptococcus pyogenes NOT DETECTED NOT DETECTED Final   A.calcoaceticus-baumannii NOT DETECTED NOT DETECTED Final  Bacteroides fragilis NOT DETECTED NOT DETECTED Final   Enterobacterales NOT DETECTED NOT DETECTED Final   Enterobacter cloacae complex NOT DETECTED NOT DETECTED Final   Escherichia coli NOT DETECTED NOT DETECTED Final   Klebsiella aerogenes NOT DETECTED NOT DETECTED Final   Klebsiella oxytoca NOT DETECTED NOT DETECTED Final   Klebsiella pneumoniae NOT DETECTED NOT DETECTED Final   Proteus species NOT DETECTED NOT DETECTED Final   Salmonella species NOT DETECTED NOT DETECTED Final   Serratia marcescens NOT DETECTED NOT DETECTED Final   Haemophilus influenzae NOT DETECTED NOT DETECTED Final   Neisseria meningitidis NOT DETECTED NOT DETECTED Final   Pseudomonas aeruginosa NOT DETECTED NOT DETECTED Final   Stenotrophomonas maltophilia NOT DETECTED NOT DETECTED Final   Candida  albicans NOT DETECTED NOT DETECTED Final   Candida auris NOT DETECTED NOT DETECTED Final   Candida glabrata NOT DETECTED NOT DETECTED Final   Candida krusei NOT DETECTED NOT DETECTED Final   Candida parapsilosis NOT DETECTED NOT DETECTED Final   Candida tropicalis NOT DETECTED NOT DETECTED Final   Cryptococcus neoformans/gattii NOT DETECTED NOT DETECTED Final    Comment: Performed at [facility], [address] [place], [place] [address]  Resp panel by RT-PCR (RSV, Flu A&B, Covid) Anterior Nasal Swab     Status: None   Collection Time: 03/27/24 10:38 PM   Specimen: Anterior Nasal Swab  Result Value Ref Range Status   SARS Coronavirus 2 by RT PCR NEGATIVE NEGATIVE Final    Comment: (NOTE) SARS-CoV-2 target nucleic acids are NOT DETECTED.  The SARS-CoV-2 RNA is generally detectable in upper respiratory specimens during the acute phase of infection. The lowest concentration of SARS-CoV-2 viral copies this assay can detect is 138 copies/mL. A negative result does not preclude SARS-Cov-2 infection and should not be used as the sole basis for treatment or other patient management decisions. A negative result may occur with  improper specimen collection/handling, submission of specimen other than nasopharyngeal swab, presence of viral mutation(s) within the areas targeted by this assay, and inadequate number of viral copies(<138 copies/mL). A negative result must be combined with clinical observations, patient history, and epidemiological information. The expected result is Negative.  Fact Sheet for Patients:  BloggerCourse.com  Fact Sheet for Healthcare Providers:  SeriousBroker.it  This test is no t yet approved or cleared by the [place]  FDA and  has been authorized for detection and/or diagnosis of SARS-CoV-2 by FDA under an Emergency Use Authorization (EUA). This EUA will remain  in effect (meaning this test can be  used) for the duration of the COVID-19 declaration under Section 564(b)(1) of the Act, 21 U.S.C.section 360bbb-3(b)(1), unless the authorization is terminated  or revoked sooner.       Influenza A by PCR NEGATIVE NEGATIVE Final   Influenza B by PCR NEGATIVE NEGATIVE Final    Comment: (NOTE) The Xpert Xpress SARS-CoV-2/FLU/RSV plus assay is intended as an aid in the diagnosis of influenza from Nasopharyngeal swab specimens and should not be used as a sole basis for treatment. Nasal washings and aspirates are unacceptable for Xpert Xpress SARS-CoV-2/FLU/RSV testing.  Fact Sheet for Patients: BloggerCourse.com  Fact Sheet for Healthcare Providers: SeriousBroker.it  This test is not yet approved or cleared by the [place]  FDA and has been authorized for detection and/or diagnosis of SARS-CoV-2 by FDA under an Emergency Use Authorization (EUA). This EUA will remain in effect (meaning this test can be used) for the duration of the COVID-19 declaration under Section 564(b)(1) of the  Act, 21 U.S.C. section 360bbb-3(b)(1), unless the authorization is terminated or revoked.     Resp Syncytial Virus by PCR NEGATIVE NEGATIVE Final    Comment: (NOTE) Fact Sheet for Patients: BloggerCourse.com  Fact Sheet for Healthcare Providers: SeriousBroker.it  This test is not yet approved or cleared by the [place]  FDA and has been authorized for detection and/or diagnosis of SARS-CoV-2 by FDA under an Emergency Use Authorization (EUA). This EUA will remain in effect (meaning this test can be used) for the duration of the COVID-19 declaration under Section 564(b)(1) of the Act, 21 U.S.C. section 360bbb-3(b)(1), unless the authorization is terminated or revoked.  Performed at [facility], [address], [place], [place] [address]   Urine Culture     Status: None    Collection Time: 03/28/24  4:28 AM   Specimen: Urine, Random  Result Value Ref Range Status   Specimen Description   Final    URINE, RANDOM Performed at [facility], [address] [place], [place] [address]    Special Requests   Final    NONE Reflexed from [id number] Performed at [facility], [address] [place], [place] [address]    Culture   Final    NO GROWTH Performed at [facility], [address] [place], [place] [address]    Report Status 03/29/2024 FINAL  Final  Blood culture (routine x 2)     Status: None (Preliminary result)   Collection Time: 03/28/24  9:19 PM   Specimen: BLOOD LEFT HAND  Result Value Ref Range Status   Specimen Description   Final    BLOOD LEFT HAND Performed at [facility], [address], [place], [place] [address]    Special Requests   Final    Blood Culture results may not be optimal due to an inadequate volume of blood received in culture bottles BOTTLES DRAWN AEROBIC ONLY Performed at [facility], [address] [place], [place] [address]    Culture   Final    NO GROWTH 1 DAY Performed at [facility], [address] [place], [place] [address]    Report Status PENDING  Incomplete     Labs: BNP (last 3 results) No results for input(s): BNP in the last 8760 hours. Basic Metabolic Panel: Recent Labs  Lab 03/27/24 2230 03/28/24 0635 03/29/24 0411 03/29/24 0746 03/30/24 0730  NA 136 138 135 135 135  K 3.4* 3.2* 5.8* 4.8 4.2  CL 101 108 107 105 103  CO2 25 21* 20* 21* 25  GLUCOSE 86 74 128* 106* 92  BUN 9 9 12 12 15   CREATININE 0.49 0.41* 0.51 0.60 0.52  CALCIUM 10.5* 9.0 10.1 10.3 10.3  MG  --  1.5*  --  2.1 2.0  PHOS  --  2.2*  --  2.4* 2.1*   Liver Function Tests: Recent Labs  Lab 03/27/24 2230 03/28/24 0635 03/29/24 0411 03/29/24 0746  AST 10* 14* 20 21  ALT <5 <5 <5 <5  ALKPHOS 67 55 57 61  BILITOT 0.5 0.4 0.3 0.4  PROT 6.4* 5.2*  5.4* 6.4*  ALBUMIN  3.1* 2.4* 2.6* 2.7*   Recent Labs  Lab 03/27/24 2230  LIPASE 15   No results for input(s): AMMONIA in the last 168 hours. CBC: Recent Labs  Lab 03/27/24 2230 03/28/24 2119 03/30/24 0730  WBC 8.6 5.8 5.0  NEUTROABS 6.2 4.0  --   HGB 10.8* 12.4 11.0*  HCT 34.4* 39.4 35.5*  MCV 91.7 93.4 92.7  PLT 144* 127* 131*   Cardiac Enzymes: No results for input(s): CKTOTAL, CKMB, CKMBINDEX, TROPONINI in the last 168 hours. BNP: Invalid input(s): POCBNP CBG: No results for input(s): GLUCAP in the last 168 hours. D-Dimer No results for input(s): DDIMER in the last 72 hours. Hgb A1c No results for input(s): HGBA1C in the last 72 hours. Lipid Profile No results for input(s): CHOL, HDL, LDLCALC, TRIG, CHOLHDL, LDLDIRECT in the last 72 hours. Thyroid function studies No results for input(s): TSH, T4TOTAL, T3FREE, THYROIDAB in the last 72 hours.  Invalid input(s): FREET3 Anemia work up No results for input(s): VITAMINB12, FOLATE, FERRITIN, TIBC, IRON, RETICCTPCT in the last 72 hours. Urinalysis    Component Value Date/Time   COLORURINE YELLOW 03/28/2024 0428   APPEARANCEUR HAZY (A) 03/28/2024 0428   LABSPEC 1.042 (H) 03/28/2024 0428   PHURINE 6.0 03/28/2024 0428   GLUCOSEU NEGATIVE 03/28/2024 0428   HGBUR LARGE (A) 03/28/2024 0428   BILIRUBINUR NEGATIVE 03/28/2024 0428   KETONESUR NEGATIVE 03/28/2024 0428   PROTEINUR NEGATIVE 03/28/2024 0428   NITRITE NEGATIVE 03/28/2024 0428   LEUKOCYTESUR MODERATE (A) 03/28/2024 0428   Sepsis Labs Recent Labs  Lab 03/27/24 2230 03/28/24 2119 03/30/24 0730  WBC 8.6 5.8 5.0   Microbiology Recent Results (from the past 240 hours)  Blood culture (routine x 2)     Status: Abnormal   Collection Time: 03/27/24 10:30 PM   Specimen: BLOOD  Result Value Ref Range Status   Specimen Description   Final    BLOOD SITE NOT SPECIFIED Performed at [facility], [address] [place], [place] [address]    Special Requests   Final    Blood Culture adequate volume BOTTLES DRAWN AEROBIC AND ANAEROBIC Performed at [facility], [address] [place], [place] [address]    Culture  Setup Time   Final    GRAM POSITIVE COCCI IN CLUSTERS AEROBIC BOTTLE ONLY CRITICAL RESULT CALLED TO, READ BACK BY AND VERIFIED WITH: PHARMD E JACKSON 03/29/2024 @ 0024 BY AB    Culture (A)  Final    STAPHYLOCOCCUS CAPITIS THE SIGNIFICANCE OF ISOLATING THIS ORGANISM FROM A SINGLE SET OF BLOOD CULTURES WHEN MULTIPLE SETS ARE DRAWN IS UNCERTAIN. PLEASE NOTIFY THE MICROBIOLOGY DEPARTMENT WITHIN ONE WEEK IF SPECIATION AND SENSITIVITIES ARE REQUIRED. Performed at [facility], [address], [place], [place] [address]    Report Status 03/29/2024 FINAL  Final  Blood Culture ID Panel (Reflexed)     Status: Abnormal   Collection Time: 03/27/24 10:30 PM  Result Value Ref Range Status   Enterococcus faecalis NOT DETECTED NOT DETECTED Final   Enterococcus Faecium NOT DETECTED NOT DETECTED Final   Listeria monocytogenes NOT DETECTED NOT DETECTED Final   Staphylococcus species DETECTED (A) NOT DETECTED Final    Comment: CRITICAL RESULT CALLED TO, READ BACK BY AND VERIFIED WITH: PHARMD E JACKSON 03/29/2024 @ 0024 BY AB    Staphylococcus aureus (BCID) NOT DETECTED NOT DETECTED Final   Staphylococcus epidermidis NOT DETECTED NOT DETECTED Final   Staphylococcus lugdunensis NOT DETECTED NOT DETECTED Final   Streptococcus species NOT DETECTED NOT DETECTED Final   Streptococcus agalactiae NOT DETECTED NOT DETECTED Final   Streptococcus pneumoniae NOT DETECTED NOT DETECTED Final   Streptococcus pyogenes NOT DETECTED NOT DETECTED Final   A.calcoaceticus-baumannii NOT DETECTED NOT DETECTED Final   Bacteroides fragilis NOT DETECTED NOT DETECTED Final   Enterobacterales NOT DETECTED NOT DETECTED Final  Enterobacter cloacae complex NOT DETECTED NOT  DETECTED Final   Escherichia coli NOT DETECTED NOT DETECTED Final   Klebsiella aerogenes NOT DETECTED NOT DETECTED Final   Klebsiella oxytoca NOT DETECTED NOT DETECTED Final   Klebsiella pneumoniae NOT DETECTED NOT DETECTED Final   Proteus species NOT DETECTED NOT DETECTED Final   Salmonella species NOT DETECTED NOT DETECTED Final   Serratia marcescens NOT DETECTED NOT DETECTED Final   Haemophilus influenzae NOT DETECTED NOT DETECTED Final   Neisseria meningitidis NOT DETECTED NOT DETECTED Final   Pseudomonas aeruginosa NOT DETECTED NOT DETECTED Final   Stenotrophomonas maltophilia NOT DETECTED NOT DETECTED Final   Candida albicans NOT DETECTED NOT DETECTED Final   Candida auris NOT DETECTED NOT DETECTED Final   Candida glabrata NOT DETECTED NOT DETECTED Final   Candida krusei NOT DETECTED NOT DETECTED Final   Candida parapsilosis NOT DETECTED NOT DETECTED Final   Candida tropicalis NOT DETECTED NOT DETECTED Final   Cryptococcus neoformans/gattii NOT DETECTED NOT DETECTED Final    Comment: Performed at [facility], [address] [place], [place] [address]  Resp panel by RT-PCR (RSV, Flu A&B, Covid) Anterior Nasal Swab     Status: None   Collection Time: 03/27/24 10:38 PM   Specimen: Anterior Nasal Swab  Result Value Ref Range Status   SARS Coronavirus 2 by RT PCR NEGATIVE NEGATIVE Final    Comment: (NOTE) SARS-CoV-2 target nucleic acids are NOT DETECTED.  The SARS-CoV-2 RNA is generally detectable in upper respiratory specimens during the acute phase of infection. The lowest concentration of SARS-CoV-2 viral copies this assay can detect is 138 copies/mL. A negative result does not preclude SARS-Cov-2 infection and should not be used as the sole basis for treatment or other patient management decisions. A negative result may occur with  improper specimen collection/handling, submission of specimen other than nasopharyngeal swab, presence of viral mutation(s) within  the areas targeted by this assay, and inadequate number of viral copies(<138 copies/mL). A negative result must be combined with clinical observations, patient history, and epidemiological information. The expected result is Negative.  Fact Sheet for Patients:  BloggerCourse.com  Fact Sheet for Healthcare Providers:  SeriousBroker.it  This test is no t yet approved or cleared by the [place]  FDA and  has been authorized for detection and/or diagnosis of SARS-CoV-2 by FDA under an Emergency Use Authorization (EUA). This EUA will remain  in effect (meaning this test can be used) for the duration of the COVID-19 declaration under Section 564(b)(1) of the Act, 21 U.S.C.section 360bbb-3(b)(1), unless the authorization is terminated  or revoked sooner.       Influenza A by PCR NEGATIVE NEGATIVE Final   Influenza B by PCR NEGATIVE NEGATIVE Final    Comment: (NOTE) The Xpert Xpress SARS-CoV-2/FLU/RSV plus assay is intended as an aid in the diagnosis of influenza from Nasopharyngeal swab specimens and should not be used as a sole basis for treatment. Nasal washings and aspirates are unacceptable for Xpert Xpress SARS-CoV-2/FLU/RSV testing.  Fact Sheet for Patients: BloggerCourse.com  Fact Sheet for Healthcare Providers: SeriousBroker.it  This test is not yet approved or cleared by the [place]  FDA and has been authorized for detection and/or diagnosis of SARS-CoV-2 by FDA under an Emergency Use Authorization (EUA). This EUA will remain in effect (meaning this test can be used) for the duration of the COVID-19 declaration under Section 564(b)(1) of the Act, 21 U.S.C. section 360bbb-3(b)(1), unless the authorization is terminated or revoked.     Resp  Syncytial Virus by PCR NEGATIVE NEGATIVE Final    Comment: (NOTE) Fact Sheet for  Patients: BloggerCourse.com  Fact Sheet for Healthcare Providers: SeriousBroker.it  This test is not yet approved or cleared by the [place]  FDA and has been authorized for detection and/or diagnosis of SARS-CoV-2 by FDA under an Emergency Use Authorization (EUA). This EUA will remain in effect (meaning this test can be used) for the duration of the COVID-19 declaration under Section 564(b)(1) of the Act, 21 U.S.C. section 360bbb-3(b)(1), unless the authorization is terminated or revoked.  Performed at [facility], [address] [place], [place] [address]   Urine Culture     Status: None   Collection Time: 03/28/24  4:28 AM   Specimen: Urine, Random  Result Value Ref Range Status   Specimen Description   Final    URINE, RANDOM Performed at [facility], [address], [place], [place] [address]    Special Requests   Final    NONE Reflexed from [id number] Performed at [facility], [address], [place], [place] [address]    Culture   Final    NO GROWTH Performed at [facility], [address] [place], [place] [address]    Report Status 03/29/2024 FINAL  Final  Blood culture (routine x 2)     Status: None (Preliminary result)   Collection Time: 03/28/24  9:19 PM   Specimen: BLOOD LEFT HAND  Result Value Ref Range Status   Specimen Description   Final    BLOOD LEFT HAND Performed at [facility], [address], [place], [place] [address]    Special Requests   Final    Blood Culture results may not be optimal due to an inadequate volume of blood received in culture bottles BOTTLES DRAWN AEROBIC ONLY Performed at [facility], [address], [place], [place] [address]    Culture   Final    NO GROWTH 1 DAY Performed at [facility], [address] [place], [place] [address]    Report Status PENDING  Incomplete      Time coordinating discharge: Over 30 minutes  SIGNED:   Elsie JAYSON Montclair, DO [facility] 03/30/2024, 12:25 PM Pager   If 7PM-7AM, please contact night-coverage [email or web address]

## 2024-03-30 NOTE — NC FL2 (Signed)
 [place]  MEDICAID FL2 LEVEL OF CARE FORM     IDENTIFICATION  Patient Name: Denise Savage Birthdate: 05-18-1961 Sex: female Admission Date (Current Location): 03/27/2024  [facility] and [place] Number:  Producer, television/film/video and Address:  [facility],  [address] 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   Hypokalemia 03/28/2024   Hypomagnesemia 03/28/2024   Normocytic anemia 03/28/2024   Protein-calorie malnutrition, severe 03/28/2024   Hypercalcemia 03/28/2024   Thrombocytopenia 03/28/2024   Pressure injury of skin 03/28/2024   Cellulitis of right upper extremity 03/28/2024   Hemiparesis, aphasia, 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) 03/27/2024   Right hemiplegia (HCC) 03/27/2024   History of pulmonary embolus (PE) 03/27/2024   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) 03/27/2024   History of recurrent UTI (urinary tract infection) 03/27/2024   History of infection due to extended spectrum beta lactamase (ESBL) producing bacteria 03/27/2024   Fever, unspecified 03/27/2024   Gastrojejunostomy tube dislodgement 02/04/2024   Bacteremia 08/24/2023   Acute pancreatitis 03/27/2023   UTI (urinary tract infection) 01/27/2023   Severe anemia 07/13/2022   Skull defect 03/23/2021   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   Seizures (HCC)    Pulmonary embolus (HCC)    Dysphagia    Prolonged QT interval    Thickened endometrium    Sepsis (HCC) 11/18/2020   History of hemorrhagic  cerebrovascular accident (CVA) with residual deficit 09/25/2020   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   ICH (intracerebral hemorrhage) (HCC) 06/18/2020   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   SAH (subarachnoid hemorrhage) (HCC) 06/15/2020    Orientation RESPIRATION BLADDER Height & Weight     Self  Normal Incontinent Weight: 177 lb 11.1 oz (80.6 kg) Height:  5' 4 (162.6 cm)  BEHAVIORAL SYMPTOMS/MOOD NEUROLOGICAL BOWEL NUTRITION STATUS      Incontinent Diet (Heart Healthy)  AMBULATORY STATUS COMMUNICATION OF NEEDS Skin   Independent Non-Verbally Other (Comment) (Pressure Injury Foot Right, Other (Comment) Foot Anterior;Right;Lateral, and Pressure Injury Coccyx Left;Mid;Medial Stage 2)                       Personal Care Assistance Level of Assistance  Bathing, Feeding, Dressing Bathing Assistance: Limited assistance Feeding assistance: Limited assistance Dressing Assistance: Limited assistance     Functional Limitations Info  Sight, Hearing, Speech Sight Info: Impaired (eyeglasses) Hearing Info: Impaired Speech Info: Impaired (Nonverbal)    SPECIAL CARE FACTORS FREQUENCY                       Contractures Contractures Info: Not present    Additional Factors Info  Code Status, Allergies Code Status Info: DNR Allergies Info: Hydrocodone            Current Medications (03/30/2024):  This is the current hospital active medication list Current Facility-Administered Medications  Medication Dose Route Frequency Provider Last Rate Last Admin   acetaminophen  (TYLENOL ) suppository 650 mg  650 mg Rectal Once Schlossman, Erin, MD  acetaminophen  (TYLENOL ) tablet 650 mg  650 mg Oral Q6H PRN Howerter, Justin B, DO       Or   acetaminophen  (TYLENOL ) suppository 650 mg  650 mg Rectal Q6H PRN Howerter, Justin B, DO       antiseptic oral rinse (CPC / CETYLPYRIDINIUM CHLORIDE 0.05%) solution 15 mL  15 mL Mouth Rinse QID Celinda Alm Lot, MD   15 mL at 03/30/24 9049   famotidine  (PEPCID ) tablet 20 mg  20 mg Per Tube BID Celinda Alm Lot, MD   20 mg at 03/30/24 9050   feeding supplement (JEVITY 1.5 CAL/FIBER) liquid 237 mL  237 mL Per Tube 5 X Daily Celinda Alm Lot, MD   237 mL at 03/30/24 0950   fiber supplement (BANATROL TF) liquid 60 mL  60 mL Per Tube TID Lue Elsie BROCKS, MD   60 mL at 03/30/24 0950   free water  200 mL  200 mL Per Tube Q6H Lue Elsie BROCKS, MD   200 mL at 03/30/24 9048   glycopyrrolate  (ROBINUL ) tablet 1 mg  1 mg Oral Q8H PRN Celinda Alm Lot, MD       ipratropium-albuterol  (DUONEB) 0.5-2.5 (3) MG/3ML nebulizer solution 3 mL  3 mL Nebulization Q6H PRN Celinda Alm Lot, MD       lacosamide  (VIMPAT ) oral solution 100 mg  100 mg Per Tube BID Celinda Alm Lot, MD   100 mg at 03/30/24 1003   metoprolol  tartrate (LOPRESSOR ) tablet 25 mg  25 mg Per Tube BID Celinda Alm Lot, MD   25 mg at 03/30/24 9050   ondansetron  (ZOFRAN ) injection 4 mg  4 mg Intravenous Q6H PRN Howerter, Justin B, DO       oxyCODONE  (Oxy IR/ROXICODONE ) immediate release tablet 5 mg  5 mg Per Tube Q6H PRN Celinda Alm Lot, MD       tiZANidine  (ZANAFLEX ) tablet 2 mg  2 mg Per Tube TID Celinda Alm Lot, MD   2 mg at 03/30/24 9049   traZODone  (DESYREL ) tablet 25 mg  25 mg Oral QHS Celinda Alm Lot, MD   25 mg at 03/29/24 2017   valproic  acid (DEPAKENE ) 250 MG/5ML solution 750 mg  750 mg Per Tube TID Celinda Alm Lot, MD   750 mg at 03/30/24 0950   vancomycin  (VANCOREADY) IVPB 1500 mg/300 mL  1,500 mg Intravenous Q24H Fobbs, Rodericks T, RPH 150 mL/hr at 03/29/24 2317 1,500 mg at 03/29/24 2317     Discharge Medications: Please see discharge summary for a list of discharge medications.  Relevant Imaging Results:  Relevant Lab Results:   Additional Information SSN: [id number]  Heather LABOR Noralee Dutko, LCSW

## 2024-03-30 NOTE — Progress Notes (Signed)
 Second attempt t0 call report to [facility]. VM is full

## 2024-04-03 LAB — CULTURE, BLOOD (ROUTINE X 2): Culture: NO GROWTH

## 2024-06-04 ENCOUNTER — Other Ambulatory Visit: Payer: Self-pay

## 2024-06-04 ENCOUNTER — Ambulatory Visit (HOSPITAL_COMMUNITY)
Admit: 2024-06-04 | Discharge: 2024-06-04 | Disposition: A | Discharge status: Home / Self Care | Source: Ambulatory Visit | Attending: Emergency Medicine | Admitting: Emergency Medicine

## 2024-06-04 ENCOUNTER — Emergency Department (HOSPITAL_COMMUNITY)
Admission: EM | Admit: 2024-06-04 | Discharge: 2024-06-04 | Disposition: A | Discharge status: Nursing Home (Includes ICF) | Attending: Emergency Medicine | Admitting: Emergency Medicine

## 2024-06-04 ENCOUNTER — Encounter (HOSPITAL_COMMUNITY): Payer: Self-pay | Admitting: Emergency Medicine

## 2024-06-04 ENCOUNTER — Emergency Department (HOSPITAL_COMMUNITY)

## 2024-06-04 DIAGNOSIS — Z431 Encounter for attention to gastrostomy: Secondary | ICD-10-CM | POA: Insufficient documentation

## 2024-06-04 DIAGNOSIS — Z79899 Other long term (current) drug therapy: Secondary | ICD-10-CM | POA: Insufficient documentation

## 2024-06-04 DIAGNOSIS — T85528A Displacement of other gastrointestinal prosthetic devices, implants and grafts, initial encounter: Secondary | ICD-10-CM

## 2024-06-04 DIAGNOSIS — K9423 Gastrostomy malfunction: Secondary | ICD-10-CM | POA: Diagnosis present

## 2024-06-04 HISTORY — PX: IR REPLC GASTRO/COLONIC TUBE PERCUT W/FLUORO: IMG2333

## 2024-06-04 LAB — CBC WITH DIFFERENTIAL/PLATELET
Abs Immature Granulocytes: 0.01 K/uL (ref 0.00–0.07)
Basophils Absolute: 0 K/uL (ref 0.0–0.1)
Basophils Relative: 1 %
Eosinophils Absolute: 0.1 K/uL (ref 0.0–0.5)
Eosinophils Relative: 2 %
HCT: 35.7 % — ABNORMAL LOW (ref 36.0–46.0)
Hemoglobin: 11.7 g/dL — ABNORMAL LOW (ref 12.0–15.0)
Immature Granulocytes: 0 %
Lymphocytes Relative: 40 %
Lymphs Abs: 1.7 K/uL (ref 0.7–4.0)
MCH: 30.7 pg (ref 26.0–34.0)
MCHC: 32.8 g/dL (ref 30.0–36.0)
MCV: 93.7 fL (ref 80.0–100.0)
Monocytes Absolute: 0.6 K/uL (ref 0.1–1.0)
Monocytes Relative: 14 %
Neutro Abs: 1.8 K/uL (ref 1.7–7.7)
Neutrophils Relative %: 43 %
Platelets: 110 K/uL — ABNORMAL LOW (ref 150–400)
RBC: 3.81 MIL/uL — ABNORMAL LOW (ref 3.87–5.11)
RDW: 14.2 % (ref 11.5–15.5)
WBC: 4.2 K/uL (ref 4.0–10.5)
nRBC: 0 % (ref 0.0–0.2)

## 2024-06-04 LAB — CBG MONITORING, ED: Glucose-Capillary: 87 mg/dL (ref 70–99)

## 2024-06-04 LAB — BASIC METABOLIC PANEL WITH GFR
Anion gap: 12 (ref 5–15)
BUN: 19 mg/dL (ref 8–23)
CO2: 23 mmol/L (ref 22–32)
Calcium: 10.8 mg/dL — ABNORMAL HIGH (ref 8.9–10.3)
Chloride: 99 mmol/L (ref 98–111)
Creatinine, Ser: 0.65 mg/dL (ref 0.44–1.00)
GFR, Estimated: >60 mL/min
Glucose, Bld: 70 mg/dL (ref 70–99)
Potassium: 4.4 mmol/L (ref 3.5–5.1)
Sodium: 134 mmol/L — ABNORMAL LOW (ref 135–145)

## 2024-06-04 MED ORDER — IOHEXOL 300 MG/ML  SOLN
50.0000 mL | Freq: Once | INTRAMUSCULAR | Status: AC | PRN
  Administered 2024-06-04: 10 mL

## 2024-06-04 MED ORDER — LIDOCAINE VISCOUS HCL 2 % MT SOLN
OROMUCOSAL | Status: AC
  Filled 2024-06-04: qty 15

## 2024-06-04 MED ORDER — IOHEXOL 300 MG/ML  SOLN
100.0000 mL | Freq: Once | INTRAMUSCULAR | Status: AC | PRN
  Administered 2024-06-04: 100 mL via INTRAVENOUS

## 2024-06-04 NOTE — Discharge Instructions (Signed)

## 2024-06-04 NOTE — ED Triage Notes (Signed)
 Pt to the ED with [facility] from [facility] after she removed her G-tube.  Pt is nonverbal at baseline.

## 2024-06-04 NOTE — ED Provider Notes (Signed)
 "  Denise Savage  Provider Note  CSN: [id number] Arrival date & time: 06/04/24 0016  History Chief Complaint  Patient presents with   GTUBE REMOVED    Denise Savage is a 63 y.o. female brought by EMS from SNF for G-tube dislodged, unknown how long it has been out. Patient unable to give any history.    Home Medications Prior to Admission medications  Medication Sig Start Date End Date Taking? Authorizing Provider  acetaminophen  (TYLENOL ) 325 MG tablet Place 650 mg into feeding tube in the morning, at noon, in the evening, and at bedtime. (0000, 0600, 1200, 1800)    [provider]  Amino Acids-Protein Hydrolys (PRO-STAT AWC PO) by Feeding Tube route in the morning, at noon, and at bedtime.    [provider]  antiseptic oral rinse (CPC / CETYLPYRIDINIUM CHLORIDE 0.05%) 0.05 % LIQD solution 15 mLs by Mouth Rinse route in the morning, at noon, in the evening, and at bedtime. For mouth care use mouth swabs to clean out mouth    [provider]  cholecalciferol (VITAMIN D3) 25 MCG (1000 UNIT) tablet Place 1,000 Units into feeding tube in the morning.    [provider]  Dextromethorphan -quiNIDine (NUEDEXTA ) 20-10 MG capsule 1 capsule every 12 (twelve) hours. Via G-tube    [provider]  Dextrose -Sodium Chloride  (DEXTROSE  5 % AND 0.9% NACL) 5-0.9 % Inject 75 mLs into the vein in the morning, at noon, and at bedtime.    [provider]  famotidine  (PEPCID ) 20 MG tablet Place 20 mg into feeding tube 2 (two) times daily.    [provider]  fiber supplement, BANATROL TF, liquid Place 60 mLs into feeding tube 3 (three) times daily. 02/03/23   Sherrill Cable Latif, DO  glycopyrrolate  (ROBINUL ) 1 MG tablet Take 1 mg by mouth every 8 (eight) hours as needed (increased secretions).    [provider]  ipratropium-albuterol  (DUONEB) 0.5-2.5 (3) MG/3ML SOLN Take 3 mLs by nebulization every  6 (six) hours as needed.    [provider]  lacosamide  (VIMPAT ) 10 MG/ML oral solution 10 mLs 2 (two) times daily. Per feeding tube 03/13/23   [provider]  LACTOBACILLUS PO Place 1 capsule into feeding tube in the morning, at noon, and at bedtime.    [provider]  medroxyPROGESTERone  Acetate 150 MG/ML SUSY Inject 150 mg into the muscle every 3 (three) months. Patient not taking: Reported on 03/28/2024 12/05/23   [provider]  metoprolol  tartrate (LOPRESSOR ) 25 MG tablet Take 1 tablet (25 mg total) by mouth 2 (two) times daily. Patient taking differently: Place 25 mg into feeding tube 2 (two) times daily. 08/29/23   Ricky Fines, MD  Multiple Vitamin (MULTIVITAMIN WITH MINERALS) TABS tablet Take 1 tablet by mouth in the morning.    [provider]  Nutritional Supplements (ISOSOURCE 1.5 CAL) LIQD Place 237 mLs into feeding tube 5 (five) times daily. (0600, 1000, 1400, 1800 & 2200) Patient not taking: Reported on 03/28/2024    [provider]  ondansetron  (ZOFRAN ) 4 MG/5ML solution Take 4 mg by mouth every 8 (eight) hours as needed for nausea or vomiting.    [provider]  oxyCODONE  (OXY IR/ROXICODONE ) 5 MG immediate release tablet Place 5 mg into feeding tube in the morning and at bedtime. 02/23/24   [provider]  tiZANidine  (ZANAFLEX ) 2 MG tablet Place 2 mg into feeding tube 3 (three) times daily. 02/24/24  [provider]  traZODone  (DESYREL ) 50 MG tablet Take 25 mg by mouth at bedtime. (2200) 02/21/21   [provider]  valproic  acid (DEPAKENE ) 250 MG/5ML solution Place 15 mLs (750 mg total) into feeding tube 3 (three) times daily. 02/03/23   Sherrill Cable Latif, DO  Water  For Irrigation, Sterile (FREE WATER ) SOLN Place 30-240 mLs into feeding tube See admin instructions. Flush feeding tube with 240 mls of water  twice daily between feedings, flush with 60 ml of water  before and after initiating  feedings &  Flush peg tube with 30 cc of water  before and after medication administration Patient not taking: Reported on 03/28/2024    [provider]     Allergies    Hydrocodone    Review of Systems   Review of Systems Please see HPI for pertinent positives and negatives  Physical Exam BP 101/86   Pulse 63   Temp 98.1 F (36.7 C) (Oral)   Resp 16   Wt 81.6 kg   SpO2 99%   BMI 30.90 kg/m   Physical Exam Vitals and nursing note reviewed.  HENT:     Nose: Nose normal.  Eyes:     Extraocular Movements: Extraocular movements intact.  Pulmonary:     Effort: Pulmonary effort is normal.  Abdominal:     Palpations: Abdomen is soft.     Tenderness: There is no abdominal tenderness.     Comments: Stoma in epigastric area  Musculoskeletal:        General: Normal range of motion.     Cervical back: Neck supple.  Skin:    Findings: No rash (on exposed skin).  Neurological:     Mental Status: She is oriented to person, place, and time.  Psychiatric:        Mood and Affect: Mood normal.     ED Results / Procedures / Treatments   EKG None  Procedures Procedures  Medications Ordered in the ED Medications - No data to display  Initial Impression and Plan  Patient here with dislodged G-tube of unknown duration. I was unable to pass a replacement tube or even a cotton swab. Suspect this has been out several hours at minimum and stoma appears to be closed. Will need IR evaluation in AM for replacement.   ED Course   Clinical Course as of 06/04/24 0326  Tue Jun 04, 2024  0210 Spoke with Dr. Jenna, IR, who requests CT for procedural planning, they have an APP who will be here in the AM to assess the patient and decide on management. Hold in the ED until then.  [CS]  0308 CBC is unremarkable.  [CS]  0325 BMP unremarkable.  [CS]    Clinical Course User Index [CS] Denise Carlin NOVAK, MD     MDM Rules/Calculators/A&P Medical Decision Making Problems  Addressed: Dislodged gastrostomy tube: acute illness or injury  Amount and/or Complexity of Data Reviewed Labs: ordered. Decision-making details documented in ED Course. Radiology: ordered.  Risk Decision regarding hospitalization.     Final Clinical Impression(s) / ED Diagnoses Final diagnoses:  Dislodged gastrostomy tube    Rx / DC Orders ED Discharge Orders     None        Denise Carlin NOVAK, MD 06/04/24 [phone]  "

## 2024-06-04 NOTE — ED Provider Notes (Addendum)
 IR has accepted patient to be transferred to [facility] for PEG tube placement and then will transfer back to [facility].  Patient can be discharged after this.   Denise Hamilton, MD 06/04/24 1007  Patient has returned from [facility] with G-tube in place.  Stable for discharge.      Denise Hamilton, MD 06/04/24 [phone]

## 2024-06-04 NOTE — Procedures (Signed)
 PROCEDURE SUMMARY:  Successful exchange of gastrostomy tube, 18 Fr balloon retention tube in place.  No complications.  EBL = trace Fluoroscopic evaluation showed the new tube in the appropriate position.  The new G tube is ready for immediate use.   Please see full dictation in imaging section of Epic for procedure details.   Amiee Wiley H Yuya Vanwingerden PA-C 06/04/2024 12:19 PM

## 2024-06-05 ENCOUNTER — Encounter (HOSPITAL_COMMUNITY): Payer: Self-pay
